# Patient Record
Sex: Female | Born: 1960 | Race: White | Hispanic: No | Marital: Single | Smoking: Never smoker
Health system: Southern US, Community
[De-identification: ages and names within clinical notes are randomized; demographics above are authoritative.]

## PROBLEM LIST (undated history)

## (undated) DIAGNOSIS — J189 Pneumonia, unspecified organism: Secondary | ICD-10-CM

## (undated) DIAGNOSIS — K5190 Ulcerative colitis, unspecified, without complications: Secondary | ICD-10-CM

## (undated) DIAGNOSIS — K219 Gastro-esophageal reflux disease without esophagitis: Secondary | ICD-10-CM

## (undated) DIAGNOSIS — J45909 Unspecified asthma, uncomplicated: Secondary | ICD-10-CM

## (undated) DIAGNOSIS — R112 Nausea with vomiting, unspecified: Secondary | ICD-10-CM

## (undated) DIAGNOSIS — Z8759 Personal history of other complications of pregnancy, childbirth and the puerperium: Secondary | ICD-10-CM

## (undated) DIAGNOSIS — U071 COVID-19: Secondary | ICD-10-CM

## (undated) DIAGNOSIS — M1990 Unspecified osteoarthritis, unspecified site: Secondary | ICD-10-CM

## (undated) DIAGNOSIS — Z8719 Personal history of other diseases of the digestive system: Secondary | ICD-10-CM

## (undated) DIAGNOSIS — J40 Bronchitis, not specified as acute or chronic: Secondary | ICD-10-CM

## (undated) DIAGNOSIS — T4145XA Adverse effect of unspecified anesthetic, initial encounter: Secondary | ICD-10-CM

## (undated) DIAGNOSIS — Z9889 Other specified postprocedural states: Secondary | ICD-10-CM

## (undated) DIAGNOSIS — M810 Age-related osteoporosis without current pathological fracture: Secondary | ICD-10-CM

## (undated) DIAGNOSIS — E039 Hypothyroidism, unspecified: Secondary | ICD-10-CM

## (undated) DIAGNOSIS — G43909 Migraine, unspecified, not intractable, without status migrainosus: Secondary | ICD-10-CM

## (undated) DIAGNOSIS — F419 Anxiety disorder, unspecified: Secondary | ICD-10-CM

## (undated) DIAGNOSIS — E876 Hypokalemia: Secondary | ICD-10-CM

## (undated) DIAGNOSIS — E785 Hyperlipidemia, unspecified: Secondary | ICD-10-CM

## (undated) DIAGNOSIS — F32A Depression, unspecified: Secondary | ICD-10-CM

## (undated) DIAGNOSIS — I639 Cerebral infarction, unspecified: Secondary | ICD-10-CM

## (undated) DIAGNOSIS — I10 Essential (primary) hypertension: Secondary | ICD-10-CM

## (undated) DIAGNOSIS — T8859XA Other complications of anesthesia, initial encounter: Secondary | ICD-10-CM

## (undated) DIAGNOSIS — F329 Major depressive disorder, single episode, unspecified: Secondary | ICD-10-CM

## (undated) DIAGNOSIS — R768 Other specified abnormal immunological findings in serum: Secondary | ICD-10-CM

## (undated) DIAGNOSIS — T7840XA Allergy, unspecified, initial encounter: Secondary | ICD-10-CM

## (undated) DIAGNOSIS — E079 Disorder of thyroid, unspecified: Secondary | ICD-10-CM

## (undated) HISTORY — PX: BREAST SURGERY: SHX581

## (undated) HISTORY — DX: Ulcerative colitis, unspecified, without complications: K51.90

## (undated) HISTORY — DX: Personal history of other complications of pregnancy, childbirth and the puerperium: Z87.59

## (undated) HISTORY — PX: COLONOSCOPY: SHX174

## (undated) HISTORY — DX: Gastro-esophageal reflux disease without esophagitis: K21.9

## (undated) HISTORY — DX: Migraine, unspecified, not intractable, without status migrainosus: G43.909

## (undated) HISTORY — DX: Bronchitis, not specified as acute or chronic: J40

## (undated) HISTORY — DX: Age-related osteoporosis without current pathological fracture: M81.0

## (undated) HISTORY — DX: COVID-19: U07.1

## (undated) HISTORY — DX: Depression, unspecified: F32.A

## (undated) HISTORY — DX: Hyperlipidemia, unspecified: E78.5

## (undated) HISTORY — DX: Unspecified asthma, uncomplicated: J45.909

## (undated) HISTORY — DX: Other specified abnormal immunological findings in serum: R76.8

## (undated) HISTORY — PX: DILATION AND CURETTAGE OF UTERUS: SHX78

## (undated) HISTORY — DX: Allergy, unspecified, initial encounter: T78.40XA

## (undated) HISTORY — PX: ANEURYSM COILING: SHX5349

## (undated) HISTORY — DX: Hypokalemia: E87.6

## (undated) HISTORY — DX: Disorder of thyroid, unspecified: E07.9

## (undated) HISTORY — DX: Major depressive disorder, single episode, unspecified: F32.9

## (undated) HISTORY — PX: TONSILLECTOMY AND ADENOIDECTOMY: SUR1326

## (undated) HISTORY — DX: Anxiety disorder, unspecified: F41.9

---

## 1992-01-31 HISTORY — PX: LAPAROSCOPIC ABDOMINAL EXPLORATION: SHX6249

## 2006-03-25 ENCOUNTER — Inpatient Hospital Stay (HOSPITAL_COMMUNITY): Admission: AD | Admit: 2006-03-25 | Discharge: 2006-03-25 | Payer: Self-pay | Admitting: Gynecology

## 2006-03-27 ENCOUNTER — Emergency Department (HOSPITAL_COMMUNITY): Admission: EM | Admit: 2006-03-27 | Discharge: 2006-03-28 | Payer: Self-pay | Admitting: Emergency Medicine

## 2006-10-06 ENCOUNTER — Emergency Department (HOSPITAL_COMMUNITY): Admission: EM | Admit: 2006-10-06 | Discharge: 2006-10-06 | Payer: Self-pay | Admitting: Emergency Medicine

## 2007-03-19 ENCOUNTER — Encounter: Admission: RE | Admit: 2007-03-19 | Discharge: 2007-03-19 | Payer: Self-pay | Admitting: *Deleted

## 2007-03-26 ENCOUNTER — Other Ambulatory Visit: Admission: RE | Admit: 2007-03-26 | Discharge: 2007-03-26 | Payer: Self-pay | Admitting: Obstetrics and Gynecology

## 2008-07-28 ENCOUNTER — Emergency Department (HOSPITAL_COMMUNITY): Admission: EM | Admit: 2008-07-28 | Discharge: 2008-07-28 | Payer: Self-pay | Admitting: Emergency Medicine

## 2008-09-24 ENCOUNTER — Other Ambulatory Visit: Admission: RE | Admit: 2008-09-24 | Discharge: 2008-09-24 | Payer: Self-pay | Admitting: Obstetrics and Gynecology

## 2008-11-30 ENCOUNTER — Encounter: Admission: RE | Admit: 2008-11-30 | Discharge: 2008-11-30 | Payer: Self-pay | Admitting: Family Medicine

## 2010-05-09 LAB — POCT I-STAT, CHEM 8
BUN: 13 mg/dL (ref 6–23)
Calcium, Ion: 1.12 mmol/L (ref 1.12–1.32)
Chloride: 106 mEq/L (ref 96–112)
Creatinine, Ser: 0.9 mg/dL (ref 0.4–1.2)
Glucose, Bld: 108 mg/dL — ABNORMAL HIGH (ref 70–99)
HCT: 43 % (ref 36.0–46.0)
Hemoglobin: 14.6 g/dL (ref 12.0–15.0)
Potassium: 4.1 mEq/L (ref 3.5–5.1)
Sodium: 137 mEq/L (ref 135–145)
TCO2: 23 mmol/L (ref 0–100)

## 2010-05-09 LAB — URINALYSIS, ROUTINE W REFLEX MICROSCOPIC
Bilirubin Urine: NEGATIVE
Glucose, UA: NEGATIVE mg/dL
Hgb urine dipstick: NEGATIVE
Ketones, ur: 15 mg/dL — AB
Nitrite: NEGATIVE
Protein, ur: NEGATIVE mg/dL
Specific Gravity, Urine: 1.02 (ref 1.005–1.030)
Urobilinogen, UA: 0.2 mg/dL (ref 0.0–1.0)
pH: 6.5 (ref 5.0–8.0)

## 2010-05-09 LAB — ETHANOL: Alcohol, Ethyl (B): <5 mg/dL (ref 0–10)

## 2010-05-09 LAB — RAPID URINE DRUG SCREEN, HOSP PERFORMED
Amphetamines: NOT DETECTED
Barbiturates: NOT DETECTED
Benzodiazepines: POSITIVE — AB
Cocaine: NOT DETECTED
Opiates: POSITIVE — AB
Tetrahydrocannabinol: NOT DETECTED

## 2010-07-21 ENCOUNTER — Emergency Department (HOSPITAL_BASED_OUTPATIENT_CLINIC_OR_DEPARTMENT_OTHER)
Admission: EM | Admit: 2010-07-21 | Discharge: 2010-07-21 | Disposition: A | Discharge status: Home / Self Care | Payer: Self-pay | Attending: Emergency Medicine | Admitting: Emergency Medicine

## 2010-07-21 DIAGNOSIS — F329 Major depressive disorder, single episode, unspecified: Secondary | ICD-10-CM | POA: Insufficient documentation

## 2010-07-21 DIAGNOSIS — F3289 Other specified depressive episodes: Secondary | ICD-10-CM | POA: Insufficient documentation

## 2010-07-21 DIAGNOSIS — W540XXA Bitten by dog, initial encounter: Secondary | ICD-10-CM | POA: Insufficient documentation

## 2010-07-21 DIAGNOSIS — S0120XA Unspecified open wound of nose, initial encounter: Secondary | ICD-10-CM | POA: Insufficient documentation

## 2010-07-21 DIAGNOSIS — K589 Irritable bowel syndrome without diarrhea: Secondary | ICD-10-CM | POA: Insufficient documentation

## 2010-07-21 DIAGNOSIS — S51809A Unspecified open wound of unspecified forearm, initial encounter: Secondary | ICD-10-CM | POA: Insufficient documentation

## 2010-07-25 ENCOUNTER — Emergency Department (HOSPITAL_BASED_OUTPATIENT_CLINIC_OR_DEPARTMENT_OTHER)
Admission: EM | Admit: 2010-07-25 | Discharge: 2010-07-25 | Disposition: A | Discharge status: Home / Self Care | Payer: PRIVATE HEALTH INSURANCE | Attending: Emergency Medicine | Admitting: Emergency Medicine

## 2010-07-25 DIAGNOSIS — F3289 Other specified depressive episodes: Secondary | ICD-10-CM | POA: Insufficient documentation

## 2010-07-25 DIAGNOSIS — F329 Major depressive disorder, single episode, unspecified: Secondary | ICD-10-CM | POA: Insufficient documentation

## 2010-07-25 DIAGNOSIS — Z4802 Encounter for removal of sutures: Secondary | ICD-10-CM | POA: Insufficient documentation

## 2010-07-25 DIAGNOSIS — K589 Irritable bowel syndrome without diarrhea: Secondary | ICD-10-CM | POA: Insufficient documentation

## 2010-07-25 DIAGNOSIS — L03221 Cellulitis of neck: Secondary | ICD-10-CM | POA: Insufficient documentation

## 2010-07-25 DIAGNOSIS — L0211 Cutaneous abscess of neck: Secondary | ICD-10-CM | POA: Insufficient documentation

## 2010-08-02 ENCOUNTER — Emergency Department (HOSPITAL_BASED_OUTPATIENT_CLINIC_OR_DEPARTMENT_OTHER)
Admission: EM | Admit: 2010-08-02 | Discharge: 2010-08-02 | Disposition: A | Discharge status: Home / Self Care | Payer: PRIVATE HEALTH INSURANCE | Attending: Emergency Medicine | Admitting: Emergency Medicine

## 2010-08-02 DIAGNOSIS — L0211 Cutaneous abscess of neck: Secondary | ICD-10-CM | POA: Insufficient documentation

## 2010-08-02 DIAGNOSIS — K589 Irritable bowel syndrome without diarrhea: Secondary | ICD-10-CM | POA: Insufficient documentation

## 2010-08-02 DIAGNOSIS — L089 Local infection of the skin and subcutaneous tissue, unspecified: Secondary | ICD-10-CM | POA: Insufficient documentation

## 2010-08-02 DIAGNOSIS — L03221 Cellulitis of neck: Secondary | ICD-10-CM | POA: Insufficient documentation

## 2010-08-22 ENCOUNTER — Emergency Department (INDEPENDENT_AMBULATORY_CARE_PROVIDER_SITE_OTHER): Payer: PRIVATE HEALTH INSURANCE

## 2010-08-22 ENCOUNTER — Emergency Department (HOSPITAL_BASED_OUTPATIENT_CLINIC_OR_DEPARTMENT_OTHER)
Admission: EM | Admit: 2010-08-22 | Discharge: 2010-08-22 | Disposition: A | Discharge status: Home / Self Care | Payer: PRIVATE HEALTH INSURANCE | Attending: Emergency Medicine | Admitting: Emergency Medicine

## 2010-08-22 ENCOUNTER — Encounter: Payer: Self-pay | Admitting: *Deleted

## 2010-08-22 ENCOUNTER — Other Ambulatory Visit: Payer: Self-pay

## 2010-08-22 DIAGNOSIS — R51 Headache: Secondary | ICD-10-CM

## 2010-08-22 DIAGNOSIS — H538 Other visual disturbances: Secondary | ICD-10-CM

## 2010-08-22 DIAGNOSIS — I10 Essential (primary) hypertension: Secondary | ICD-10-CM | POA: Insufficient documentation

## 2010-08-22 DIAGNOSIS — R079 Chest pain, unspecified: Secondary | ICD-10-CM | POA: Insufficient documentation

## 2010-08-22 DIAGNOSIS — R209 Unspecified disturbances of skin sensation: Secondary | ICD-10-CM

## 2010-08-22 DIAGNOSIS — R42 Dizziness and giddiness: Secondary | ICD-10-CM

## 2010-08-22 HISTORY — DX: Essential (primary) hypertension: I10

## 2010-08-22 LAB — COMPREHENSIVE METABOLIC PANEL
ALT: 13 U/L (ref 0–35)
AST: 18 U/L (ref 0–37)
Albumin: 3.5 g/dL (ref 3.5–5.2)
Alkaline Phosphatase: 72 U/L (ref 39–117)
BUN: 18 mg/dL (ref 6–23)
CO2: 26 mEq/L (ref 19–32)
Calcium: 9 mg/dL (ref 8.4–10.5)
Chloride: 107 mEq/L (ref 96–112)
Creatinine, Ser: 0.6 mg/dL (ref 0.50–1.10)
GFR calc Af Amer: >60 mL/min (ref >60)
GFR calc non Af Amer: >60 mL/min (ref >60)
Glucose, Bld: 112 mg/dL — ABNORMAL HIGH (ref 70–99)
Potassium: 4 mEq/L (ref 3.5–5.1)
Sodium: 142 mEq/L (ref 135–145)
Total Bilirubin: 0.2 mg/dL — ABNORMAL LOW (ref 0.3–1.2)
Total Protein: 6.7 g/dL (ref 6.0–8.3)

## 2010-08-22 LAB — DIFFERENTIAL
Basophils Absolute: 0 10*3/uL (ref 0.0–0.1)
Basophils Relative: 1 % (ref 0–1)
Eosinophils Absolute: 0.2 10*3/uL (ref 0.0–0.7)
Eosinophils Relative: 4 % (ref 0–5)
Lymphocytes Relative: 31 % (ref 12–46)
Lymphs Abs: 1.7 10*3/uL (ref 0.7–4.0)
Monocytes Absolute: 0.5 10*3/uL (ref 0.1–1.0)
Monocytes Relative: 8 % (ref 3–12)
Neutro Abs: 3.1 10*3/uL (ref 1.7–7.7)
Neutrophils Relative %: 56 % (ref 43–77)

## 2010-08-22 LAB — CBC
HCT: 38.2 % (ref 36.0–46.0)
Hemoglobin: 13.1 g/dL (ref 12.0–15.0)
MCH: 30.3 pg (ref 26.0–34.0)
MCHC: 34.3 g/dL (ref 30.0–36.0)
MCV: 88.4 fL (ref 78.0–100.0)
Platelets: 249 10*3/uL (ref 150–400)
RBC: 4.32 MIL/uL (ref 3.87–5.11)
RDW: 11.9 % (ref 11.5–15.5)
WBC: 5.6 10*3/uL (ref 4.0–10.5)

## 2010-08-22 LAB — CK TOTAL AND CKMB (NOT AT ARMC)
CK, MB: 1.7 ng/mL (ref 0.3–4.0)
Relative Index: INVALID (ref 0.0–2.5)
Total CK: 70 U/L (ref 7–177)

## 2010-08-22 LAB — TROPONIN I: Troponin I: <0.3 ng/mL (ref <0.30)

## 2010-08-22 NOTE — ED Notes (Signed)
Pt reports woke up at 4 am with c/o dizziness. States her left arm has pain and fingers feel numb. Also c/o blurred vision. Pt drove self to ER. Denies nausea/vomitting. Denies pain elsewhere. States breathing "different than usual." Vital signs stable. Pt in NAD.

## 2010-08-22 NOTE — ED Notes (Signed)
While rounding, pt mentioned she wants her ears checked for wax build up that may be causing her dizziness. Also reports bloody stools for "the last couple of months". MD Delo made aware, no new orders received.

## 2010-08-22 NOTE — ED Provider Notes (Signed)
History     Chief Complaint  Patient presents with  . Dizziness   HPI Comments: Patient states was shag-dancing last night, didn't feel well, so left early.  Woke at 4 AM with "splitting headache".  Broad[facility] John med and went back to bed.  Woke this morning feeling tight in chest, continued headache, nauseated, and left arm feels numb.    The history is provided by the patient.    Past Medical History  Diagnosis Date  . Hypertension     History reviewed. No pertinent past surgical history.  No family history on file.  History  Substance Use Topics  . Smoking status: Not on file  . Smokeless tobacco: Not on file  . Alcohol Use: 0.6 oz/week    1 Cans of beer per week    OB History    Grav Para Term Preterm Abortions TAB SAB Ect Mult Living                  Review of Systems  Constitutional: Positive for activity change. Negative for chills, appetite change and fatigue.  HENT: Negative for ear pain, neck pain and neck stiffness.   Eyes: Positive for visual disturbance.  Respiratory: Positive for chest tightness.   Cardiovascular: Positive for chest pain.  Gastrointestinal: Positive for nausea. Negative for vomiting and abdominal distention.  Neurological: Positive for headaches.  All other systems reviewed and are negative.    Physical Exam  BP 153/65  Pulse 84  Temp(Src) 97.8 F (36.6 C) (Oral)  Resp 16  SpO2 100%  Physical Exam  Constitutional: She is oriented to person, place, and time. She appears well-developed and well-nourished. No distress.  HENT:  Head: Normocephalic and atraumatic.  Eyes: EOM are normal. Pupils are equal, round, and reactive to light.  Neck: Normal range of motion. Neck supple.  Cardiovascular: Normal rate, regular rhythm and normal heart sounds.  Exam reveals no gallop and no friction rub.   No murmur heard. Pulmonary/Chest: Effort normal and breath sounds normal. No respiratory distress.  Abdominal: Soft. She exhibits no  distension. There is no tenderness.  Musculoskeletal: Normal range of motion. She exhibits no edema.  Neurological: She is alert and oriented to person, place, and time. No cranial nerve deficit.  Skin: Skin is warm and dry. She is not diaphoretic.  Psychiatric: She has a normal mood and affect.    ED Course  Procedures  MDM Labs, EKG, CT all okay.  Clinical picture somewhat confusing but doesn't appear to be any emergent condition.      Riley Lam [facility] 08/22/10 2841

## 2010-11-11 LAB — POCT CARDIAC MARKERS
CKMB, poc: <1 — ABNORMAL LOW
Myoglobin, poc: 44.1
Operator id: 4534
Troponin i, poc: <0.05

## 2010-11-11 LAB — BASIC METABOLIC PANEL
BUN: 12
CO2: 27
Calcium: 9.2
Chloride: 106
Creatinine, Ser: 0.75
GFR calc Af Amer: >60
GFR calc non Af Amer: >60
Glucose, Bld: 85
Potassium: 3.8
Sodium: 141

## 2010-11-11 LAB — DIFFERENTIAL
Basophils Absolute: 0.1
Basophils Relative: 1
Eosinophils Absolute: 0.3
Eosinophils Relative: 4
Lymphocytes Relative: 33
Lymphs Abs: 2.6
Monocytes Absolute: 0.6
Monocytes Relative: 8
Neutro Abs: 4.4
Neutrophils Relative %: 55

## 2010-11-11 LAB — CBC
HCT: 40
Hemoglobin: 14
MCHC: 35
MCV: 88.9
Platelets: 226
RBC: 4.49
RDW: 12.8
WBC: 8

## 2012-05-20 ENCOUNTER — Ambulatory Visit (INDEPENDENT_AMBULATORY_CARE_PROVIDER_SITE_OTHER): Payer: BC Managed Care – PPO | Admitting: Licensed Clinical Social Worker

## 2012-05-20 DIAGNOSIS — IMO0002 Reserved for concepts with insufficient information to code with codable children: Secondary | ICD-10-CM

## 2012-06-10 ENCOUNTER — Ambulatory Visit (INDEPENDENT_AMBULATORY_CARE_PROVIDER_SITE_OTHER): Payer: BC Managed Care – PPO | Admitting: Licensed Clinical Social Worker

## 2012-06-10 DIAGNOSIS — IMO0002 Reserved for concepts with insufficient information to code with codable children: Secondary | ICD-10-CM

## 2012-06-26 ENCOUNTER — Ambulatory Visit (INDEPENDENT_AMBULATORY_CARE_PROVIDER_SITE_OTHER): Payer: BC Managed Care – PPO | Admitting: Licensed Clinical Social Worker

## 2012-06-26 DIAGNOSIS — IMO0002 Reserved for concepts with insufficient information to code with codable children: Secondary | ICD-10-CM

## 2012-07-15 ENCOUNTER — Ambulatory Visit: Payer: BC Managed Care – PPO | Admitting: Licensed Clinical Social Worker

## 2012-10-22 ENCOUNTER — Encounter: Payer: Self-pay | Admitting: Certified Nurse Midwife

## 2012-10-23 ENCOUNTER — Ambulatory Visit (INDEPENDENT_AMBULATORY_CARE_PROVIDER_SITE_OTHER): Payer: BC Managed Care – PPO | Admitting: Certified Nurse Midwife

## 2012-10-23 ENCOUNTER — Encounter: Payer: Self-pay | Admitting: Certified Nurse Midwife

## 2012-10-23 VITALS — BP 120/82 | HR 80 | Resp 16 | Ht 65.5 in | Wt 184.0 lb

## 2012-10-23 DIAGNOSIS — Z01419 Encounter for gynecological examination (general) (routine) without abnormal findings: Secondary | ICD-10-CM

## 2012-10-23 DIAGNOSIS — Z0000 Encounter for general adult medical examination without abnormal findings: Secondary | ICD-10-CM

## 2012-10-23 NOTE — Patient Instructions (Signed)

## 2012-10-23 NOTE — Progress Notes (Signed)
52 y.o. [id number] Single Caucasian Fe here for annual exam. Menopausal no vaginal bleeding or vaginal dryness. Currently in treatment for anxiety and depression. Hypertension better, no medication now. Not sexually active. 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   Patient's last menstrual period was 01/31/2007.          Sexually active: no  The current method of family planning is post menopausal status.    Exercising: no  no exercise Smoker:  no  Health Maintenance: Pap:  11-04-10 neg MMG:  10-17-12 normal Colonoscopy: 2007 BMD:   2007 TDaP:  6/12 Labs: none Self breast exam: done occ   reports that she has never smoked. She does not have any smokeless tobacco history on file. She reports that she drinks about 1.2 ounces of alcohol per week. She reports that she does not use illicit drugs.  Past Medical History  Diagnosis Date  . Hypertension   . Migraines   . Depression   . Anxiety     Past Surgical History  Procedure Laterality Date  . Tonsillectomy and adenoidectomy    . Laparoscopic abdominal exploration  1994    endometriosis  . Dilation and curettage of uterus      Current Outpatient Prescriptions  Medication Sig Dispense Refill  . Asenapine Maleate (SAPHRIS SL) Place under the tongue. 2 at bedtime      . buPROPion (WELLBUTRIN XL) 150 MG 24 hr tablet 3 (three) times daily.      . DULoxetine (CYMBALTA) 60 MG capsule Take 60 mg by mouth daily.        . Mirtazapine (REMERON PO) Take by mouth daily.       . Sertraline HCl (ZOLOFT PO) Take by mouth 2 (two) times daily.        No current facility-administered medications for this visit.    Family History  Problem Relation Age of Onset  . Breast cancer Mother   . Diabetes Mother   . Cancer Father     colon  . Diabetes Father     ROS:  Pertinent items are noted in HPI.  Otherwise, a comprehensive ROS was negative.  Exam:   BP 120/82  Pulse 80  Resp 16  Ht 5' 5.5" (1.664 m)  Wt 184 lb (83.462 kg)  BMI  30.14 kg/m2  LMP 01/31/2007 Height: 5' 5.5" (166.4 cm)  Ht Readings from Last 3 Encounters:  10/23/12 5' 5.5" (1.664 m)    General appearance: alert, cooperative and appears stated age Head: Normocephalic, without obvious abnormality, atraumatic Neck: no adenopathy, supple, symmetrical, trachea midline and thyroid normal to inspection and palpation Lungs: clear to auscultation bilaterally Breasts: normal appearance, no masses or tenderness, No nipple retraction or dimpling, No nipple discharge or bleeding, No axillary or supraclavicular adenopathy Heart: regular rate and rhythm Abdomen: soft, non-tender; no masses,  no organomegaly Extremities: extremities normal, atraumatic, no cyanosis or edema Skin: Skin color, texture, turgor normal. No rashes or lesions Lymph nodes: Cervical, supraclavicular, and axillary nodes normal. No abnormal inguinal nodes palpated Neurologic: Grossly normal   Pelvic: External genitalia:  no lesions              Urethra:  normal appearing urethra with no masses, tenderness or lesions              Bartholin's and Skene's: normal                 Vagina: normal appearing vagina with normal color and discharge, no lesions  Cervix: normal, non tender              Pap taken: yes Bimanual Exam:  Uterus:  normal size, contour, position, consistency, mobility, non-tender and anteverted              Adnexa: normal adnexa and no mass, fullness, tenderness               Rectovaginal: Confirms               Anus:  normal sphincter tone, no lesions  A:  Well Woman with normal exam  Menopausal no HRT  Hypertension stable without medication now  Anxiety under treatment with MD  Family history of breast cancer(mother 9)  P:   Reviewed health and wellness pertinent to exam  Aware of need to evaluate if vaginal bleeding  Continue follow up as indicated  Stressed SBE, mammogram and clinical exam  Pap smear as per guidelines   Mammogram yearly pap smear  taken today with HPVHR  counseled on breast self exam, mammography screening, STD prevention, menopause, adequate intake of calcium and vitamin D, diet and exercise, discussed risks and benefits of colonoscopy declines scheduling at this point.  Lab IFOB  return annually or prn  An After Visit Summary was printed and given to the patient.

## 2012-10-24 NOTE — Progress Notes (Signed)
Note reviewed, agree with plan.  Ayani Ospina, MD  

## 2012-10-28 LAB — IPS PAP TEST WITH HPV

## 2012-10-29 ENCOUNTER — Ambulatory Visit (INDEPENDENT_AMBULATORY_CARE_PROVIDER_SITE_OTHER): Payer: BC Managed Care – PPO | Admitting: Family Medicine

## 2012-10-29 VITALS — BP 130/90 | HR 84 | Temp 97.9°F | Resp 18 | Ht 66.5 in | Wt 184.0 lb

## 2012-10-29 DIAGNOSIS — M79601 Pain in right arm: Secondary | ICD-10-CM

## 2012-10-29 DIAGNOSIS — M79609 Pain in unspecified limb: Secondary | ICD-10-CM

## 2012-10-29 DIAGNOSIS — R030 Elevated blood-pressure reading, without diagnosis of hypertension: Secondary | ICD-10-CM

## 2012-10-29 DIAGNOSIS — IMO0001 Reserved for inherently not codable concepts without codable children: Secondary | ICD-10-CM

## 2012-10-29 LAB — POCT CBC
Granulocyte percent: 54 %G (ref 37–80)
HCT, POC: 47.1 % (ref 37.7–47.9)
Hemoglobin: 15.2 g/dL (ref 12.2–16.2)
Lymph, poc: 1.9 (ref 0.6–3.4)
MCH, POC: 30.2 pg (ref 27–31.2)
MCHC: 32.3 g/dL (ref 31.8–35.4)
MCV: 93.4 fL (ref 80–97)
MID (cbc): 0.5 (ref 0–0.9)
MPV: 10.3 fL (ref 0–99.8)
POC Granulocyte: 2.8 (ref 2–6.9)
POC LYMPH PERCENT: 36.4 %L (ref 10–50)
POC MID %: 9.6 %M (ref 0–12)
Platelet Count, POC: 255 10*3/uL (ref 142–424)
RBC: 5.04 M/uL (ref 4.04–5.48)
RDW, POC: 14.3 %
WBC: 5.2 10*3/uL (ref 4.6–10.2)

## 2012-10-29 LAB — BASIC METABOLIC PANEL
BUN: 11 mg/dL (ref 6–23)
CO2: 28 mEq/L (ref 19–32)
Calcium: 9.4 mg/dL (ref 8.4–10.5)
Chloride: 103 mEq/L (ref 96–112)
Creat: 0.9 mg/dL (ref 0.50–1.10)
Glucose, Bld: 82 mg/dL (ref 70–99)
Potassium: 3.7 mEq/L (ref 3.5–5.3)
Sodium: 141 mEq/L (ref 135–145)

## 2012-10-29 MED ORDER — METAXALONE 800 MG PO TABS: 800.0000 mg | ORAL_TABLET | Freq: Three times a day (TID) | ORAL | Status: DC

## 2012-10-29 NOTE — Patient Instructions (Addendum)
If you have any concerns please give me a call- if you have any chest pain, weakness in the arm, or any other concerns please get help right away.    Use the muscle relaxer as needed -remember it can make you feel sleepy.

## 2012-10-29 NOTE — Progress Notes (Addendum)
[facility] [address] [place] [address] [phone]  Date:  10/29/2012   Name:  Tiffany Horn   DOB:  Aug 04, 1960   MRN:  [id number]  PCP:  No PCP Per Patient    Chief Complaint: pain in arm right arm feels like fluid in arm and Hypertension   History of Present Illness:  Tiffany Horn is a 52 y.o. very pleasant female patient who presents with the following:  Here today with pain in her right arm that she noted last night.  She awoke a couple of times with the arm throbbing.   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  She "just did not feel right" while standing up during lunch.  She went to the nurse office and her blood pressure was noted to be elevated- BP 161/114  Her right arm feels "numb" to the touch, and feels "like it has fluid in it."  Her arm is more comfortable hanging straight down- flexing at the elbow is uncomfortable.  The arm does not seem weak.   No known trauma to her arm.   She is no longer on any BP medication- she was tx for HTN several years ago.  Tiffany Horn at [facility] is her PCP  Never a smoker.   Her MGF had a history of heart problems- this occurred in his 16s.  Otherwise she is not aware of any family history of CAD.  She does not have any personal history of heart problems.    She is not aware of any increased activity or exercise as of late She ate lunch about 3 hours ago.     There are no active problems to display for this patient.   Past Medical History  Diagnosis Date  . Hypertension   . Migraines   . Depression   . Anxiety     Past Surgical History  Procedure Laterality Date  . Tonsillectomy and adenoidectomy    . Laparoscopic abdominal exploration  1994    endometriosis  . Dilation and curettage of uterus      History  Substance Use Topics  . Smoking status: Never Smoker   . Smokeless tobacco: Not on file  . Alcohol Use: 1.2 oz/week    2 Cans of beer per week     Family History  Problem Relation Age of Onset  . Breast cancer Mother   . Diabetes Mother   . Cancer Father     colon  . Diabetes Father     No Known Allergies  Medication list has been reviewed and updated.  Current Outpatient Prescriptions on File Prior to Visit  Medication Sig Dispense Refill  . Asenapine Maleate (SAPHRIS SL) Place under the tongue. 2 at bedtime      . buPROPion (WELLBUTRIN XL) 150 MG 24 hr tablet 3 (three) times daily.      . Mirtazapine (REMERON PO) Take by mouth daily.       . Sertraline HCl (ZOLOFT PO) Take by mouth 2 (two) times daily.       . DULoxetine (CYMBALTA) 60 MG capsule Take 60 mg by mouth daily.         No current facility-administered medications on file prior to visit.    Review of Systems:  As per HPI- otherwise negative.   Physical Examination: Filed Vitals:   10/29/12 1306  BP: 138/82  Pulse: 84  Temp: 97.9 F (36.6 C)  Resp: 18  Filed Vitals:   10/29/12 1306  Height: 5' 6.5" (1.689 m)  Weight: 184 lb (83.462 kg)   Body mass index is 29.26 kg/(m^2). Ideal Body Weight: Weight in (lb) to have BMI = 25: 156.9  GEN: WDWN, NAD, Non-toxic, A & O x 3, overweight, looks well HEENT: Atraumatic, Normocephalic. Neck supple. No masses, No LAD.  Bilateral TM wnl, oropharynx normal.  PEERL,EOMI.   Ears and Nose: No external deformity. CV: RRR, No M/G/R. No JVD. No thrill. No extra heart sounds. PULM: CTA B, no wheezes, crackles, rhonchi. No retractions. No resp. distress. No accessory muscle use. ABD: S, NT, ND, +BS. No rebound. No HSM. EXTR: No c/c/e.  I am not able to appreciate any swelling or change in "texture" of her right forearm compared to her left.   NEURO Normal gait. Normal strength and DTR all extremities.  She feels sore in her right forearm, but does not note numbness on exam.  Normal balance PSYCH: Normally interactive. Conversant. Not depressed or anxious appearing.  Calm demeanor.  She is tender in the right  trapezius muscle, and over the right shoulder blade.  She is also tender in the right forearm, more on the palmar side.   Normal ROM of right shoulder, no pain.  No rash or lesion on the arm or shoulder to suggest shingles.  No axillary LAD  EKG: NSR, no ST elevation or depression  Results for orders placed in visit on 10/29/12  POCT CBC      Result Value Range   WBC 5.2  4.6 - 10.2 K/uL   Lymph, poc 1.9  0.6 - 3.4   POC LYMPH PERCENT 36.4  10 - 50 %L   MID (cbc) 0.5  0 - 0.9   POC MID % 9.6  0 - 12 %M   POC Granulocyte 2.8  2 - 6.9   Granulocyte percent 54.0  37 - 80 %G   RBC 5.04  4.04 - 5.48 M/uL   Hemoglobin 15.2  12.2 - 16.2 g/dL   HCT, POC 65.7  84.6 - 47.9 %   MCV 93.4  80 - 97 fL   MCH, POC 30.2  27 - 31.2 pg   MCHC 32.3  31.8 - 35.4 g/dL   RDW, POC 96.2     Platelet Count, POC 255  142 - 424 K/uL   MPV 10.3  0 - 99.8 fL    Assessment and Plan: Right arm pain - Plan: metaxalone (SKELAXIN) 800 MG tablet  Elevated BP - Plan: POCT CBC, Basic metabolic panel, EKG 12-Lead  Tiffany Horn is here today with pain in her right arm and right trapezius, and elevated BP while at work today.  At this time her BP is ok.  She appears well, in no distress and her exam and objective physical findings/ labs/ EKG today are reassuring.    Explained that her sx do not definitively fit with any one diagnosis, and is it possible that her sx represent something more serious including cardiac pain, an UE clot or a stroke.  Offered to arrange further evaluation at the ER.  However at this time she declines further evaluation and wishes to try a muscle relaxer.  Her arm pain may come from the spasm in her right trapezius.    She will seek further care if not better in the next day or two- Sooner if worse.   Signed Abbe Amsterdam, MD  10/2: called to check on her.  Her right arm continues  to hurt, mainly when she holds it in a certain position,.  She suspect a pinched nerve- I agree.  Asked her to come in  for a recheck today.  She has just taken a muscle relaxer and does not want to drive, but does plan to come in tomorrow for a recheck.  Let her know that her BMP is normal

## 2012-11-01 ENCOUNTER — Ambulatory Visit (INDEPENDENT_AMBULATORY_CARE_PROVIDER_SITE_OTHER): Payer: BC Managed Care – PPO | Admitting: Emergency Medicine

## 2012-11-01 ENCOUNTER — Ambulatory Visit: Payer: Self-pay

## 2012-11-01 VITALS — BP 120/80 | HR 92 | Temp 98.0°F | Resp 18 | Ht 66.0 in | Wt 179.0 lb

## 2012-11-01 DIAGNOSIS — M542 Cervicalgia: Secondary | ICD-10-CM

## 2012-11-01 DIAGNOSIS — M5412 Radiculopathy, cervical region: Secondary | ICD-10-CM

## 2012-11-01 MED ORDER — MELOXICAM 7.5 MG PO TABS: ORAL_TABLET | ORAL | Status: DC

## 2012-11-01 MED ORDER — GABAPENTIN 300 MG PO CAPS: ORAL_CAPSULE | ORAL | Status: DC

## 2012-11-01 NOTE — Patient Instructions (Addendum)
Cervical Radiculopathy  Cervical radiculopathy happens when a nerve in the neck is pinched or bruised by a slipped (herniated) disk or by arthritic changes in the bones of the cervical spine. This can occur due to an injury or as part of the normal aging process. Pressure on the cervical nerves can cause pain or numbness that runs from your neck all the way down into your arm and fingers.  CAUSES   There are many possible causes, including:   Injury.   Muscle tightness in the neck from overuse.   Swollen, painful joints (arthritis).   Breakdown or degeneration in the bones and joints of the spine (spondylosis) due to aging.   Bone spurs that may develop near the cervical nerves.  SYMPTOMS   Symptoms include pain, weakness, or numbness in the affected arm and hand. Pain can be severe or irritating. Symptoms may be worse when extending or turning the neck.  DIAGNOSIS   Your caregiver will ask about your symptoms and do a physical exam. He or she may test your strength and reflexes. X-rays, CT scans, and MRI scans may be needed in cases of injury or if the symptoms do not go away after a period of time. Electromyography (EMG) or nerve conduction testing may be done to study how your nerves and muscles are working.  TREATMENT   Your caregiver may recommend certain exercises to help relieve your symptoms. Cervical radiculopathy can, and often does, get better with time and treatment. If your problems continue, treatment options may include:   Wearing a soft collar for short periods of time.   Physical therapy to strengthen the neck muscles.   Medicines, such as nonsteroidal anti-inflammatory drugs (NSAIDs), oral corticosteroids, or spinal injections.   Surgery. Different types of surgery may be done depending on the cause of your problems.  HOME CARE INSTRUCTIONS    Put ice on the affected area.   Put ice in a plastic bag.   Place a towel between your skin and the bag.    Leave the ice on for 15-20 minutes, 3-4 times a day or as directed by your caregiver.   If ice does not help, you can try using heat. Take a warm shower or bath, or use a hot water bottle as directed by your caregiver.   You may try a gentle neck and shoulder massage.   Use a flat pillow when you sleep.   Only take over-the-counter or prescription medicines for pain, discomfort, or fever as directed by your caregiver.   If physical therapy was prescribed, follow your caregiver's directions.   If a soft collar was prescribed, use it as directed.  SEEK IMMEDIATE MEDICAL CARE IF:    Your pain gets much worse and cannot be controlled with medicines.   You have weakness or numbness in your hand, arm, face, or leg.   You have a high fever or a stiff, rigid neck.   You lose bowel or bladder control (incontinence).   You have trouble with walking, balance, or speaking.  MAKE SURE YOU:    Understand these instructions.   Will watch your condition.   Will get help right away if you are not doing well or get worse.  Document Released: 10/11/2000 Document Revised: 04/10/2011 Document Reviewed: 08/30/2010  [employer] Patient Information 2014 [employer].

## 2012-11-01 NOTE — Progress Notes (Signed)
  Subjective:    Patient ID: Tiffany Horn, female    DOB: 27-Jul-1960, 52 y.o.   MRN: [id number]  HPI Pt here for a recheck right forearm pain. Saw Dr. Patsy Lager on Tues who dx'ed pt with a pinched nerve and tried her on a muscle relaxant. Not feeling any better. Right 3rd and 4th digit paresthesia. Not able to sleep. Pain with lifting and lowering arm. Having neck pain.   Review of Systems     Objective:   Physical Exam there is pain with extension of the neck. Deep tendon reflexes are 2+ and symmetrical motor strength 5 out of 5.  [facility] reading (PRIMARY) by  Dr. Cleta Alberts there is C4-5 C5-6 C6-7 degenerative disc disease.        Assessment & Plan:  Patient presents with degenerative disc disease of the C-spine. We'll treat with Mobic and Neurontin. MRI scheduled of the C-spine.

## 2012-11-25 ENCOUNTER — Ambulatory Visit (INDEPENDENT_AMBULATORY_CARE_PROVIDER_SITE_OTHER): Payer: BC Managed Care – PPO | Admitting: Licensed Clinical Social Worker

## 2012-11-25 DIAGNOSIS — IMO0002 Reserved for concepts with insufficient information to code with codable children: Secondary | ICD-10-CM

## 2012-11-27 ENCOUNTER — Ambulatory Visit
Admission: RE | Admit: 2012-11-27 | Discharge: 2012-11-27 | Disposition: A | Discharge status: Home / Self Care | Payer: BC Managed Care – PPO | Source: Ambulatory Visit | Attending: Emergency Medicine | Admitting: Emergency Medicine

## 2012-11-27 DIAGNOSIS — M542 Cervicalgia: Secondary | ICD-10-CM

## 2012-12-10 ENCOUNTER — Telehealth: Payer: Self-pay | Admitting: Radiology

## 2012-12-10 DIAGNOSIS — M5412 Radiculopathy, cervical region: Secondary | ICD-10-CM

## 2012-12-10 NOTE — Telephone Encounter (Signed)
Copy mailed to her. Referral made. Apologized for the delay, she did not get call in a timely manner, she indicates okay, she is feeling better.

## 2012-12-10 NOTE — Telephone Encounter (Signed)
Left message for patient to call back about the scan. She should be evaluated by one of the neurosurgeons, have left message for her to call me back.

## 2012-12-16 ENCOUNTER — Ambulatory Visit: Payer: BC Managed Care – PPO | Admitting: Licensed Clinical Social Worker

## 2012-12-27 ENCOUNTER — Emergency Department (HOSPITAL_BASED_OUTPATIENT_CLINIC_OR_DEPARTMENT_OTHER)
Admission: EM | Admit: 2012-12-27 | Discharge: 2012-12-27 | Disposition: A | Discharge status: Home / Self Care | Payer: BC Managed Care – PPO | Attending: Emergency Medicine | Admitting: Emergency Medicine

## 2012-12-27 ENCOUNTER — Encounter (HOSPITAL_BASED_OUTPATIENT_CLINIC_OR_DEPARTMENT_OTHER): Payer: Self-pay | Admitting: Emergency Medicine

## 2012-12-27 ENCOUNTER — Emergency Department (HOSPITAL_BASED_OUTPATIENT_CLINIC_OR_DEPARTMENT_OTHER): Payer: BC Managed Care – PPO

## 2012-12-27 DIAGNOSIS — F411 Generalized anxiety disorder: Secondary | ICD-10-CM | POA: Insufficient documentation

## 2012-12-27 DIAGNOSIS — J159 Unspecified bacterial pneumonia: Secondary | ICD-10-CM | POA: Insufficient documentation

## 2012-12-27 DIAGNOSIS — G43909 Migraine, unspecified, not intractable, without status migrainosus: Secondary | ICD-10-CM | POA: Insufficient documentation

## 2012-12-27 DIAGNOSIS — R000 Tachycardia, unspecified: Secondary | ICD-10-CM | POA: Insufficient documentation

## 2012-12-27 DIAGNOSIS — I10 Essential (primary) hypertension: Secondary | ICD-10-CM | POA: Insufficient documentation

## 2012-12-27 DIAGNOSIS — F3289 Other specified depressive episodes: Secondary | ICD-10-CM | POA: Insufficient documentation

## 2012-12-27 DIAGNOSIS — R197 Diarrhea, unspecified: Secondary | ICD-10-CM | POA: Insufficient documentation

## 2012-12-27 DIAGNOSIS — R109 Unspecified abdominal pain: Secondary | ICD-10-CM | POA: Insufficient documentation

## 2012-12-27 DIAGNOSIS — Z79899 Other long term (current) drug therapy: Secondary | ICD-10-CM | POA: Insufficient documentation

## 2012-12-27 DIAGNOSIS — R112 Nausea with vomiting, unspecified: Secondary | ICD-10-CM | POA: Insufficient documentation

## 2012-12-27 DIAGNOSIS — F329 Major depressive disorder, single episode, unspecified: Secondary | ICD-10-CM | POA: Insufficient documentation

## 2012-12-27 DIAGNOSIS — J189 Pneumonia, unspecified organism: Secondary | ICD-10-CM

## 2012-12-27 LAB — CBC WITH DIFFERENTIAL/PLATELET
Band Neutrophils: 26 % — ABNORMAL HIGH (ref 0–10)
Basophils Absolute: 0 10*3/uL (ref 0.0–0.1)
Basophils Relative: 0 % (ref 0–1)
Blasts: 0 %
Eosinophils Absolute: 0 10*3/uL (ref 0.0–0.7)
Eosinophils Relative: 0 % (ref 0–5)
HCT: 37.7 % (ref 36.0–46.0)
Hemoglobin: 12.8 g/dL (ref 12.0–15.0)
Lymphocytes Relative: 6 % — ABNORMAL LOW (ref 12–46)
Lymphs Abs: 0.6 10*3/uL — ABNORMAL LOW (ref 0.7–4.0)
MCH: 29.3 pg (ref 26.0–34.0)
MCHC: 34 g/dL (ref 30.0–36.0)
MCV: 86.3 fL (ref 78.0–100.0)
Metamyelocytes Relative: 0 %
Monocytes Absolute: 0 10*3/uL — ABNORMAL LOW (ref 0.1–1.0)
Monocytes Relative: 0 % — ABNORMAL LOW (ref 3–12)
Myelocytes: 0 %
Neutro Abs: 9.1 10*3/uL — ABNORMAL HIGH (ref 1.7–7.7)
Neutrophils Relative %: 68 % (ref 43–77)
Platelets: 172 10*3/uL (ref 150–400)
Promyelocytes Absolute: 0 %
RBC: 4.37 MIL/uL (ref 3.87–5.11)
RDW: 13 % (ref 11.5–15.5)
WBC Morphology: INCREASED
WBC: 9.7 10*3/uL (ref 4.0–10.5)
nRBC: 0 /100 WBC

## 2012-12-27 LAB — URINALYSIS, ROUTINE W REFLEX MICROSCOPIC
Bilirubin Urine: NEGATIVE
Glucose, UA: NEGATIVE mg/dL
Ketones, ur: NEGATIVE mg/dL
Nitrite: NEGATIVE
Protein, ur: 30 mg/dL — AB
Specific Gravity, Urine: 1.021 (ref 1.005–1.030)
Urobilinogen, UA: 1 mg/dL (ref 0.0–1.0)
pH: 6 (ref 5.0–8.0)

## 2012-12-27 LAB — COMPREHENSIVE METABOLIC PANEL
ALT: 38 U/L — ABNORMAL HIGH (ref 0–35)
AST: 35 U/L (ref 0–37)
Albumin: 3.3 g/dL — ABNORMAL LOW (ref 3.5–5.2)
Alkaline Phosphatase: 64 U/L (ref 39–117)
BUN: 17 mg/dL (ref 6–23)
CO2: 24 mEq/L (ref 19–32)
Calcium: 8.8 mg/dL (ref 8.4–10.5)
Chloride: 96 mEq/L (ref 96–112)
Creatinine, Ser: 0.9 mg/dL (ref 0.50–1.10)
GFR calc Af Amer: 85 mL/min — ABNORMAL LOW (ref >90)
GFR calc non Af Amer: 73 mL/min — ABNORMAL LOW (ref >90)
Glucose, Bld: 116 mg/dL — ABNORMAL HIGH (ref 70–99)
Potassium: 3.3 mEq/L — ABNORMAL LOW (ref 3.5–5.1)
Sodium: 136 mEq/L (ref 135–145)
Total Bilirubin: 1.3 mg/dL — ABNORMAL HIGH (ref 0.3–1.2)
Total Protein: 7.2 g/dL (ref 6.0–8.3)

## 2012-12-27 LAB — TROPONIN I: Troponin I: <0.3 ng/mL (ref <0.30)

## 2012-12-27 LAB — URINE MICROSCOPIC-ADD ON

## 2012-12-27 LAB — LIPASE, BLOOD: Lipase: 8 U/L — ABNORMAL LOW (ref 11–59)

## 2012-12-27 LAB — CG4 I-STAT (LACTIC ACID): Lactic Acid, Venous: 3.46 mmol/L — ABNORMAL HIGH (ref 0.5–2.2)

## 2012-12-27 MED ORDER — PHENYLEPH-PROMETHAZINE-COD 5-6.25-10 MG/5ML PO SYRP: 5.0000 mL | ORAL_SOLUTION | Freq: Two times a day (BID) | ORAL | Status: DC | PRN

## 2012-12-27 MED ORDER — GI COCKTAIL ~~LOC~~
30.0000 mL | Freq: Once | ORAL | Status: AC
  Administered 2012-12-27: 30 mL via ORAL
  Filled 2012-12-27: qty 30

## 2012-12-27 MED ORDER — SODIUM CHLORIDE 0.9 % IV BOLUS (SEPSIS)
1000.0000 mL | Freq: Once | INTRAVENOUS | Status: AC
  Administered 2012-12-27: 1000 mL via INTRAVENOUS

## 2012-12-27 MED ORDER — HYDROMORPHONE HCL PF 1 MG/ML IJ SOLN
0.5000 mg | Freq: Once | INTRAMUSCULAR | Status: AC
  Administered 2012-12-27: 0.5 mg via INTRAVENOUS
  Filled 2012-12-27: qty 1

## 2012-12-27 MED ORDER — LEVOFLOXACIN 500 MG PO TABS: 500.0000 mg | ORAL_TABLET | Freq: Every day | ORAL | Status: AC

## 2012-12-27 MED ORDER — LEVOFLOXACIN IN D5W 500 MG/100ML IV SOLN
500.0000 mg | Freq: Once | INTRAVENOUS | Status: AC
  Administered 2012-12-27: 500 mg via INTRAVENOUS
  Filled 2012-12-27 (×2): qty 100

## 2012-12-27 MED ORDER — ONDANSETRON HCL 4 MG/2ML IJ SOLN
4.0000 mg | Freq: Once | INTRAMUSCULAR | Status: AC
  Administered 2012-12-27: 4 mg via INTRAVENOUS
  Filled 2012-12-27: qty 2

## 2012-12-27 NOTE — ED Notes (Signed)
Pt c/o shortness of breath, chest congestion, vomiting and diarrhea. Pt sts she had the flu last week and felt better but then developed new symptoms Wed night. Pt sts 95% and speaking in full sentences.

## 2012-12-27 NOTE — ED Provider Notes (Signed)
CSN: [id number]     Arrival date & time 12/27/12  4696 History   First MD Initiated Contact with Patient 12/27/12 [phone]     Chief Complaint  Patient presents with  . Emesis  . Shortness of Breath    HPI  Patient presents with dyspnea, congestion, nausea, vomiting, diarrhea, abdominal pain. Patient notes that one week ago she was clinically diagnosed with the flu, was prescribed amoxicillin, and has been taking the medication since that time.  She states that her URI like symptoms improved until yesterday.  Since yesterday she has had persistent discomfort.  Symptoms have been marginally better, after becoming severe soon after onset.  She also complains of chills, denies fever or denies confusion, disorientation. No relief with anything. The abdominal pain is left sided, nonradiating, sore, worse after vomiting.   Past Medical History  Diagnosis Date  . Hypertension   . Migraines   . Depression   . Anxiety    Past Surgical History  Procedure Laterality Date  . Tonsillectomy and adenoidectomy    . Laparoscopic abdominal exploration  1994    endometriosis  . Dilation and curettage of uterus     Family History  Problem Relation Age of Onset  . Breast cancer Mother   . Diabetes Mother   . Cancer Father     colon  . Diabetes Father    History  Substance Use Topics  . Smoking status: Never Smoker   . Smokeless tobacco: Not on file  . Alcohol Use: 1.2 oz/week    2 Cans of beer per week   OB History   Grav Para Term Preterm Abortions TAB SAB Ect Mult Living   2 0 0 0 2  2   0     Review of Systems  Constitutional:       Per HPI, otherwise negative  HENT:       Per HPI, otherwise negative  Respiratory:       Per HPI, otherwise negative  Cardiovascular:       Per HPI, otherwise negative  Gastrointestinal: Positive for nausea, vomiting and diarrhea.  Endocrine:       Negative aside from HPI  Genitourinary:       Neg aside from HPI   Musculoskeletal:       Per  HPI, otherwise negative  Skin: Negative.   Neurological: Negative for syncope.    Allergies  Review of patient's allergies indicates no known allergies.  Home Medications   Current Outpatient Rx  Name  Route  Sig  Dispense  Refill  . Asenapine Maleate (SAPHRIS SL)   Sublingual   Place under the tongue. 2 at bedtime         . buPROPion (WELLBUTRIN XL) 150 MG 24 hr tablet      3 (three) times daily.         . DULoxetine (CYMBALTA) 60 MG capsule   Oral   Take 60 mg by mouth daily.           Marland Kitchen gabapentin (NEURONTIN) 300 MG capsule      Take one tablet at night for the next 3 nights and if tolerated take one in the morning and one at bedtime.   60 capsule   1   . meloxicam (MOBIC) 7.5 MG tablet      Take 1-2 tablets each morning for pain.   30 tablet   1   . metaxalone (SKELAXIN) 800 MG tablet   Oral   Take  1 tablet (800 mg total) by mouth 3 (three) times daily. As needed for muscle spasm   30 tablet   0   . Mirtazapine (REMERON PO)   Oral   Take by mouth daily.          . Sertraline HCl (ZOLOFT PO)   Oral   Take by mouth 2 (two) times daily.           BP 121/80  Pulse 115  Temp(Src) 98.3 F (36.8 C) (Oral)  Resp 20  SpO2 93%  LMP 01/31/2007 Physical Exam  Nursing note and vitals reviewed. Constitutional: She is oriented to person, place, and time. She appears well-developed and well-nourished. No distress.  HENT:  Head: Normocephalic and atraumatic.  Eyes: Conjunctivae and EOM are normal.  Cardiovascular: Regular rhythm.  Tachycardia present.   Pulmonary/Chest: Effort normal and breath sounds normal. No stridor. No respiratory distress.  Abdominal: Soft. Normal appearance. She exhibits no distension. There is tenderness in the epigastric area and left upper quadrant. There is no rigidity, no rebound and no guarding.  Musculoskeletal: She exhibits no edema.  Neurological: She is alert and oriented to person, place, and time. No cranial nerve  deficit.  Skin: Skin is warm and dry.  Psychiatric: She has a normal mood and affect.    ED Course  Procedures (including critical care time) Labs Review Labs Reviewed  CBC WITH DIFFERENTIAL  COMPREHENSIVE METABOLIC PANEL  LIPASE, BLOOD  URINALYSIS, ROUTINE W REFLEX MICROSCOPIC  TROPONIN I   Imaging Review No results found.  EKG Interpretation   None      Lactic Acid 3.46, IVF were started empirically  2:08 PM Patient states that she is feeling better.  No new complaints.  I had a lengthy conversation with her about her diagnosis, need for antibiotics.  Recommended admission, the patient defers saying that she has home commitments.  I reiterated the importance of close monitoring.  MDM   1. CAP (community acquired pneumonia)    This patient presents with multiple complaints.  Notably, the patient was recently treated for URI like illness with amoxicillin.  Today the patient has both ongoing cough, congestion as well as nausea, vomiting, diarrhea with left-sided abdominal pain.  With initial urinalysis demonstrating hematuria, CT scan was performed.  This, x-ray, labs all suggest pneumonia as likely etiology for her complaints.  The patient received fluids, initial antibiotics, felt better.  Patient preferred discharge.  With the absence of distress, though she remained mildly tachycardic, this seems reasonable.  She was discharged with explicit return precautions, f/u instructions.    Gerhard Munch, MD 12/27/12 [phone]

## 2012-12-28 LAB — URINE CULTURE: Colony Count: 50000

## 2013-03-14 ENCOUNTER — Telehealth: Payer: Self-pay | Admitting: Family Medicine

## 2013-03-14 ENCOUNTER — Ambulatory Visit: Payer: BC Managed Care – PPO

## 2013-03-14 ENCOUNTER — Inpatient Hospital Stay
Admission: RE | Admit: 2013-03-14 | Discharge: 2013-03-14 | Disposition: A | Discharge status: Home / Self Care | Payer: Self-pay | Source: Ambulatory Visit | Attending: Family Medicine | Admitting: Family Medicine

## 2013-03-14 ENCOUNTER — Ambulatory Visit (INDEPENDENT_AMBULATORY_CARE_PROVIDER_SITE_OTHER): Payer: BC Managed Care – PPO | Admitting: Family Medicine

## 2013-03-14 VITALS — BP 138/86 | HR 100 | Temp 98.8°F | Resp 16 | Ht 65.5 in | Wt 174.0 lb

## 2013-03-14 DIAGNOSIS — R5383 Other fatigue: Secondary | ICD-10-CM

## 2013-03-14 DIAGNOSIS — R1319 Other dysphagia: Secondary | ICD-10-CM

## 2013-03-14 DIAGNOSIS — R000 Tachycardia, unspecified: Secondary | ICD-10-CM

## 2013-03-14 DIAGNOSIS — R5381 Other malaise: Secondary | ICD-10-CM

## 2013-03-14 DIAGNOSIS — R059 Cough, unspecified: Secondary | ICD-10-CM

## 2013-03-14 DIAGNOSIS — R05 Cough: Secondary | ICD-10-CM

## 2013-03-14 LAB — POCT CBC
Granulocyte percent: 74 %G (ref 37–80)
HCT, POC: 42.9 % (ref 37.7–47.9)
Hemoglobin: 13.5 g/dL (ref 12.2–16.2)
Lymph, poc: 0.9 (ref 0.6–3.4)
MCH, POC: 28.9 pg (ref 27–31.2)
MCHC: 31.5 g/dL — AB (ref 31.8–35.4)
MCV: 91.9 fL (ref 80–97)
MID (cbc): 0.3 (ref 0–0.9)
MPV: 10.3 fL (ref 0–99.8)
POC Granulocyte: 3.5 (ref 2–6.9)
POC LYMPH PERCENT: 18.9 %L (ref 10–50)
POC MID %: 7.1 %M (ref 0–12)
Platelet Count, POC: 189 10*3/uL (ref 142–424)
RBC: 4.67 M/uL (ref 4.04–5.48)
RDW, POC: 14 %
WBC: 4.7 10*3/uL (ref 4.6–10.2)

## 2013-03-14 LAB — POCT URINE PREGNANCY: Preg Test, Ur: NEGATIVE

## 2013-03-14 LAB — D-DIMER, QUANTITATIVE: D-Dimer, Quant: 0.39 ug/mL-FEU (ref 0.00–0.48)

## 2013-03-14 LAB — POCT INFLUENZA A/B
Influenza A, POC: NEGATIVE
Influenza B, POC: NEGATIVE

## 2013-03-14 MED ORDER — DOXYCYCLINE HYCLATE 100 MG PO CAPS: 100.0000 mg | ORAL_CAPSULE | Freq: Two times a day (BID) | ORAL | Status: DC

## 2013-03-14 MED ORDER — CEFDINIR 300 MG PO CAPS: 300.0000 mg | ORAL_CAPSULE | Freq: Two times a day (BID) | ORAL | Status: DC

## 2013-03-14 NOTE — Telephone Encounter (Signed)
Patient called stating she was told she would get a phone call today regarding her xrays Would like a call back on her home phone

## 2013-03-14 NOTE — Telephone Encounter (Signed)
addressed

## 2013-03-14 NOTE — Patient Instructions (Signed)
I will give you a call in a few hours with your D dimer result.  If positive we will move ahead with a CT angiogram.  Assuming D dimer is negative we will go ahead and treat you with the antibiotic- omnicef

## 2013-03-14 NOTE — Progress Notes (Signed)
[facility] [address] [place] [address] [phone]  Date:  03/14/2013   Name:  Tiffany Horn   DOB:  28-Oct-1960   MRN:  [id number]  PCP:  Tommy Medal, MD    Chief Complaint: Cough   History of Present Illness:  Tiffany Horn is a 53 y.o. very pleasant female patient who presents with the following:  December 2- 4th 2014 she was in the hospital in [place], [place] with pneumonia.  She was allowed home with her mother and was "on bed rest" for 2 weeks.  She then returned to her old PCP in [place] and "I still had it," had to rest 2 more weeks.   She finally recovered and was able to get back to work in early January  This past week she felt tired after work, but did not have any other particular sx. Yesterday she noted return of cough and congestion. This am she noted a deep, painful cough again and is "petrified it is going to turn into pneumonia." She felt warm last night but did not check her temp- she is having chills.   She notes body aches, feels very tired.   She does note a stuffy nose but no ST.   No GI symptoms.   She does feel a little SOB when she walks, especially up stairs.  She just noted this yesterday.   No CP except with cough She is coughing up a little phlegm- no blood.    No hisptory of DVT or PE, not a smoker, she is not on hormones at all.  No immobilization except for her recent period of "bed rest."  Menopausal for about 7 years.    She also mentions that she has had difficulty swallowing solids only (not liquids) for the last couple of weeks- food seems to catch in her throat.  She is concerned about this.  Also is due for a colonoscopy.    There are no active problems to display for this patient.   Past Medical History  Diagnosis Date  . Hypertension   . Migraines   . Depression   . Anxiety     Past Surgical History  Procedure Laterality Date  . Tonsillectomy and adenoidectomy    . Laparoscopic abdominal exploration   1994    endometriosis  . Dilation and curettage of uterus      History  Substance Use Topics  . Smoking status: Never Smoker   . Smokeless tobacco: Not on file  . Alcohol Use: 1.2 oz/week    2 Cans of beer per week    Family History  Problem Relation Age of Onset  . Breast cancer Mother   . Diabetes Mother   . Cancer Father     colon  . Diabetes Father     No Known Allergies  Medication list has been reviewed and updated.  Current Outpatient Prescriptions on File Prior to Visit  Medication Sig Dispense Refill  . buPROPion (WELLBUTRIN XL) 150 MG 24 hr tablet 3 (three) times daily.      . Sertraline HCl (ZOLOFT PO) Take by mouth 2 (two) times daily.       . DULoxetine (CYMBALTA) 60 MG capsule Take 60 mg by mouth daily.        Marland Kitchen gabapentin (NEURONTIN) 300 MG capsule Take one tablet at night for the next 3 nights and if tolerated take one in the morning and one at bedtime.  60 capsule  1  .  meloxicam (MOBIC) 7.5 MG tablet Take 1-2 tablets each morning for pain.  30 tablet  1  . Mirtazapine (REMERON PO) Take by mouth daily.       Marland Kitchen Phenyleph-Promethazine-Cod 5-6.25-10 MG/5ML SYRP Take 5 mLs by mouth every 12 (twelve) hours as needed.  120 mL  0  . [DISCONTINUED] Asenapine Maleate (SAPHRIS SL) Place under the tongue. 2 at bedtime       No current facility-administered medications on file prior to visit.    Review of Systems:  As per HPI- otherwise negative.   Physical Examination: Filed Vitals:   03/14/13 0937  BP: 138/86  Pulse: 100  Temp: 98.8 F (37.1 C)  Resp: 16   Filed Vitals:   03/14/13 0937  Height: 5' 5.5" (1.664 m)  Weight: 174 lb (78.926 kg)   Body mass index is 28.5 kg/(m^2). Ideal Body Weight: Weight in (lb) to have BMI = 25: 152.2  GEN: WDWN, NAD, Non-toxic, A & O x 3, looks well HEENT: Atraumatic, Normocephalic. Neck supple. No masses, No LAD.  Bilateral TM wnl, oropharynx normal.  PEERL,EOMI.  Ears and Nose: No external deformity. CV: RRR, No  M/G/R. No JVD. No thrill. No extra heart sounds. PULM: CTA B, no wheezes, crackles, rhonchi. No retractions. No resp. distress. No accessory muscle use. ABD: S, NT, ND, +BS. No rebound. No HSM. EXTR: No c/c/e NEURO Normal gait.  PSYCH: Normally interactive. Conversant. Not depressed or anxious appearing.  Calm demeanor.  No calf swelling or tenderness  Wt Readings from Last 3 Encounters:  03/14/13 174 lb (78.926 kg)  11/01/12 179 lb (81.194 kg)  10/29/12 184 lb (83.462 kg)    [facility] reading (PRIMARY) by  Dr. Lorelei Pont. CXR: RUL infiltrate vs scarring- pt had pneumonia and was hospitalized in early December but we do not have these records. CHEST 2 VIEW  COMPARISON: December 31, 2012  FINDINGS: The left lung is now clear. There is, however, persistent infiltrate in the posterior segment of the right upper lobe. Elsewhere, the right lung appears clear. Heart size and pulmonary vascularity are normal. No adenopathy. No bone lesions.  IMPRESSION: Persistent right upper lobe infiltrate consistent with pneumonia. Elsewhere, lungs are now clear. No adenopathy appreciable.  Radiology was able to compare her current film with her film from December as above.   Results for orders placed in visit on 03/14/13  D-DIMER, QUANTITATIVE      Result Value Ref Range   D-Dimer, Quant 0.39  0.00 - 0.48 ug/mL-FEU  POCT CBC      Result Value Ref Range   WBC 4.7  4.6 - 10.2 K/uL   Lymph, poc 0.9  0.6 - 3.4   POC LYMPH PERCENT 18.9  10 - 50 %L   MID (cbc) 0.3  0 - 0.9   POC MID % 7.1  0 - 12 %M   POC Granulocyte 3.5  2 - 6.9   Granulocyte percent 74.0  37 - 80 %G   RBC 4.67  4.04 - 5.48 M/uL   Hemoglobin 13.5  12.2 - 16.2 g/dL   HCT, POC 42.9  37.7 - 47.9 %   MCV 91.9  80 - 97 fL   MCH, POC 28.9  27 - 31.2 pg   MCHC 31.5 (*) 31.8 - 35.4 g/dL   RDW, POC 14.0     Platelet Count, POC 189  142 - 424 K/uL   MPV 10.3  0 - 99.8 fL  POCT INFLUENZA A/B  Result Value Ref Range   Influenza A, POC  Negative     Influenza B, POC Negative    POCT URINE PREGNANCY      Result Value Ref Range   Preg Test, Ur Negative       Assessment and Plan: Cough - Plan: DG Chest 2 View, DG Chest 2 View, POCT urine pregnancy, cefdinir (OMNICEF) 300 MG capsule, DISCONTINUED: doxycycline (VIBRAMYCIN) 100 MG capsule  Other malaise and fatigue - Plan: POCT CBC, POCT Influenza A/B, DG Chest 2 View  Tachycardia - Plan: DG Chest 2 View, D-dimer, quantitative  Other dysphagia - Plan: Ambulatory referral to Gastroenterology  Pneumonia- treat with omnicef. She thinks she was sent home on levaquin after her last hospitalization.  Checked D dimer due to SOB and tachycardia- negative Plan to repeat CXR in 3 months to ensure it clears. Referral to GI to evaluate her swallowing difficulty  Cautioned her to keep a close eye on her symptoms.  If fever or if getting worse please let me know right away!   Signed Lamar Blinks, MD

## 2013-03-21 ENCOUNTER — Emergency Department (HOSPITAL_BASED_OUTPATIENT_CLINIC_OR_DEPARTMENT_OTHER)
Admission: EM | Admit: 2013-03-21 | Discharge: 2013-03-21 | Disposition: A | Discharge status: Home / Self Care | Payer: BC Managed Care – PPO | Attending: Emergency Medicine | Admitting: Emergency Medicine

## 2013-03-21 ENCOUNTER — Encounter (HOSPITAL_BASED_OUTPATIENT_CLINIC_OR_DEPARTMENT_OTHER): Payer: Self-pay | Admitting: Emergency Medicine

## 2013-03-21 ENCOUNTER — Emergency Department (HOSPITAL_BASED_OUTPATIENT_CLINIC_OR_DEPARTMENT_OTHER): Payer: BC Managed Care – PPO

## 2013-03-21 DIAGNOSIS — F3289 Other specified depressive episodes: Secondary | ICD-10-CM | POA: Insufficient documentation

## 2013-03-21 DIAGNOSIS — W010XXA Fall on same level from slipping, tripping and stumbling without subsequent striking against object, initial encounter: Secondary | ICD-10-CM | POA: Insufficient documentation

## 2013-03-21 DIAGNOSIS — Z792 Long term (current) use of antibiotics: Secondary | ICD-10-CM | POA: Insufficient documentation

## 2013-03-21 DIAGNOSIS — Y9389 Activity, other specified: Secondary | ICD-10-CM | POA: Insufficient documentation

## 2013-03-21 DIAGNOSIS — S42213A Unspecified displaced fracture of surgical neck of unspecified humerus, initial encounter for closed fracture: Secondary | ICD-10-CM

## 2013-03-21 DIAGNOSIS — F329 Major depressive disorder, single episode, unspecified: Secondary | ICD-10-CM | POA: Insufficient documentation

## 2013-03-21 DIAGNOSIS — G43909 Migraine, unspecified, not intractable, without status migrainosus: Secondary | ICD-10-CM | POA: Insufficient documentation

## 2013-03-21 DIAGNOSIS — Z79899 Other long term (current) drug therapy: Secondary | ICD-10-CM | POA: Insufficient documentation

## 2013-03-21 DIAGNOSIS — Y929 Unspecified place or not applicable: Secondary | ICD-10-CM | POA: Insufficient documentation

## 2013-03-21 DIAGNOSIS — I10 Essential (primary) hypertension: Secondary | ICD-10-CM | POA: Insufficient documentation

## 2013-03-21 DIAGNOSIS — Z8701 Personal history of pneumonia (recurrent): Secondary | ICD-10-CM | POA: Insufficient documentation

## 2013-03-21 DIAGNOSIS — F411 Generalized anxiety disorder: Secondary | ICD-10-CM | POA: Insufficient documentation

## 2013-03-21 MED ORDER — OXYCODONE-ACETAMINOPHEN 5-325 MG PO TABS
2.0000 | ORAL_TABLET | Freq: Once | ORAL | Status: AC
  Administered 2013-03-21: 2 via ORAL
  Filled 2013-03-21 (×2): qty 2

## 2013-03-21 MED ORDER — OXYCODONE-ACETAMINOPHEN 5-325 MG PO TABS: 2.0000 | ORAL_TABLET | ORAL | Status: DC | PRN

## 2013-03-21 NOTE — ED Notes (Signed)
Reports slipped and fell on ice- c/o left shoulder pain- guarding- states too painful to move- left radial pulse present, hand warm, cap refill <3sec

## 2013-03-21 NOTE — ED Notes (Signed)
Pt does not have friends/family at bedside, informed pt that in order to administer Percocet that was ordered per EDP she would need a driver - pt calling several people at this time. Instructed pt to inform RN when driver available and medications would be administered per order.

## 2013-03-21 NOTE — Discharge Instructions (Signed)
Humerus Fracture, Treated with Immobilization Follow up with Dr. Ninfa Linden next week. Return to the ED if you develop new or worsening symptoms. The humerus is the large bone in your upper arm. You have a broken (fractured) humerus. These fractures are easily diagnosed with X-rays. TREATMENT  Simple fractures which will heal without disability are treated with simple immobilization. Immobilization means you will wear a cast, splint, or sling. You have a fracture which will do well with immobilization. The fracture will heal well simply by being held in a good position until it is stable enough to begin range of motion exercises. Do not take part in activities which would further injure your arm.  HOME CARE INSTRUCTIONS   Put ice on the injured area.  Put ice in a plastic bag.  Place a towel between your skin and the bag.  Leave the ice on for 15-20 minutes, 03-04 times a day.  If you have a cast:  Do not scratch the skin under the cast using sharp or pointed objects.  Check the skin around the cast every day. You may put lotion on any red or sore areas.  Keep your cast dry and clean.  If you have a splint:  Wear the splint as directed.  Keep your splint dry and clean.  You may loosen the elastic around the splint if your fingers become numb, tingle, or turn cold or blue.  If you have a sling:  Wear the sling as directed.  Do not put pressure on any part of your cast or splint until it is fully hardened.  Your cast or splint can be protected during bathing with a plastic bag. Do not lower the cast or splint into water.  Only take over-the-counter or prescription medicines for pain, discomfort, or fever as directed by your caregiver.  Do range of motion exercises as instructed by your caregiver.  Follow up as directed by your caregiver. This is very important in order to avoid permanent injury or disability and chronic pain. SEEK IMMEDIATE MEDICAL CARE IF:   Your skin or  nails in the injured arm turn blue or gray.  Your arm feels cold or numb.  You develop severe pain in the injured arm.  You are having problems with the medicines you were given. MAKE SURE YOU:   Understand these instructions.  Will watch your condition.  Will get help right away if you are not doing well or get worse. Document Released: 04/24/2000 Document Revised: 04/10/2011 Document Reviewed: 03/02/2010 [facility] Patient Information 2014 [place].

## 2013-03-21 NOTE — ED Notes (Signed)
Pt has driver

## 2013-03-21 NOTE — ED Provider Notes (Signed)
CSN: [id number]     Arrival date & time 03/21/13  1912 History  This chart was scribed for Tiffany Essex, MD by Anastasia Pall, ED Scribe. This patient was seen in room MH10/MH10 and the patient's care was started at 7:23 PM.   Chief Complaint  Patient presents with  . Shoulder Pain  . Fall   (Consider location/radiation/quality/duration/timing/severity/associated sxs/prior Treatment) The history is provided by the patient. No language interpreter was used.   HPI Comments: Kelse Ploch is a 53 y.o. female who presents to the Emergency Department complaining of constant, left shoulder pain, onset earlier this evening when she slipped on ice and fell on her shoulder. She reports decreased ROM to her left arm, and states movement of her left arm exacerbates her shoulder pain. She denies LOC, other joint pain, wounds, numbness, tingling, neck pain, back pain, and any other associated symptoms. She reports having recently been diagnosed with pneumonia, is taking medication. She denies h/o DM, any other medical history.   PCP Tommy Medal, MD  Past Medical History  Diagnosis Date  . Hypertension   . Migraines   . Depression   . Anxiety    Past Surgical History  Procedure Laterality Date  . Tonsillectomy and adenoidectomy    . Laparoscopic abdominal exploration  1994    endometriosis  . Dilation and curettage of uterus     Family History  Problem Relation Age of Onset  . Breast cancer Mother   . Diabetes Mother   . Cancer Father     colon  . Diabetes Father    History  Substance Use Topics  . Smoking status: Never Smoker   . Smokeless tobacco: Never Used  . Alcohol Use: 1.2 oz/week    2 Cans of beer per week   OB History   Grav Para Term Preterm Abortions TAB SAB Ect Mult Living   2 0 0 0 2  2   0     Review of Systems A complete 10 system review of systems was obtained and all systems are negative except as noted in the HPI and PMH.   Allergies  Review of patient's  allergies indicates no known allergies.  Home Medications   Current Outpatient Rx  Name  Route  Sig  Dispense  Refill  . buPROPion (WELLBUTRIN XL) 150 MG 24 hr tablet      3 (three) times daily.         . cefdinir (OMNICEF) 300 MG capsule   Oral   Take 1 capsule (300 mg total) by mouth 2 (two) times daily.   20 capsule   0   . Mirtazapine (REMERON PO)   Oral   Take by mouth daily.          . Sertraline HCl (ZOLOFT PO)   Oral   Take by mouth 2 (two) times daily.          Marland Kitchen oxyCODONE-acetaminophen (PERCOCET/ROXICET) 5-325 MG per tablet   Oral   Take 2 tablets by mouth every 4 (four) hours as needed for severe pain.   15 tablet   0   . Phenyleph-Promethazine-Cod 5-6.25-10 MG/5ML SYRP   Oral   Take 5 mLs by mouth every 12 (twelve) hours as needed.   120 mL   0    BP 139/88  Pulse 103  Temp(Src) 98.3 F (36.8 C) (Oral)  Resp 20  Ht 5\' 6"  (1.676 m)  Wt 170 lb (77.111 kg)  BMI 27.45 kg/m2  SpO2 93%  LMP 01/31/2007  Physical Exam  Nursing note and vitals reviewed. Constitutional: She is oriented to person, place, and time. She appears well-developed and well-nourished. No distress.  HENT:  Head: Normocephalic and atraumatic.  Eyes: EOM are normal.  Neck: Neck supple. No spinous process tenderness and no muscular tenderness present.  No C-spine tenderness.   Cardiovascular: Normal rate.   Pulmonary/Chest: Effort normal. No respiratory distress.  Abdominal: Soft. There is no tenderness.  Musculoskeletal: Normal range of motion. She exhibits tenderness.       Cervical back: Normal.  Anterior and lateral shoulder tenderness. No bovius dislocation. Clavicle appears intact. +2 radial pulse. Cardinal hand movements intact. FROM of left elbow and hand.   Neurological: She is alert and oriented to person, place, and time. No sensory deficit.  Axillary nerve sensation intact.   Skin: Skin is warm and dry.  Psychiatric: She has a normal mood and affect. Her  behavior is normal.   ED Course  Procedures (including critical care time)  DIAGNOSTIC STUDIES: Oxygen Saturation is 93% on room air, adequate by my interpretation.    COORDINATION OF CARE: 7:26 PM-Discussed treatment plan which includes DG right shoulder and pain medication with pt at bedside and pt agreed to plan.   Results for orders placed in visit on 03/14/13  D-DIMER, QUANTITATIVE      Result Value Ref Range   D-Dimer, Quant 0.39  0.00 - 0.48 ug/mL-FEU  POCT CBC      Result Value Ref Range   WBC 4.7  4.6 - 10.2 K/uL   Lymph, poc 0.9  0.6 - 3.4   POC LYMPH PERCENT 18.9  10 - 50 %L   MID (cbc) 0.3  0 - 0.9   POC MID % 7.1  0 - 12 %M   POC Granulocyte 3.5  2 - 6.9   Granulocyte percent 74.0  37 - 80 %G   RBC 4.67  4.04 - 5.48 M/uL   Hemoglobin 13.5  12.2 - 16.2 g/dL   HCT, POC 42.9  37.7 - 47.9 %   MCV 91.9  80 - 97 fL   MCH, POC 28.9  27 - 31.2 pg   MCHC 31.5 (*) 31.8 - 35.4 g/dL   RDW, POC 14.0     Platelet Count, POC 189  142 - 424 K/uL   MPV 10.3  0 - 99.8 fL  POCT INFLUENZA A/B      Result Value Ref Range   Influenza A, POC Negative     Influenza B, POC Negative    POCT URINE PREGNANCY      Result Value Ref Range   Preg Test, Ur Negative     Dg Shoulder Left  03/21/2013   CLINICAL DATA:  Fall, left shoulder pain  EXAM: LEFT SHOULDER - 2+ VIEW  COMPARISON:  03/14/2013 chest x-ray  FINDINGS: there is an acute displaced left proximal humerus surgical neck fracture. Fracture lines also noted throughout the humeral head. Limited exam because of positioning related to the fracture. No gross subluxation or dislocation. AC joint aligned. Visualized left lung clear.  IMPRESSION: Acute displaced left proximal humerus surgical neck and head complex fracture.   Electronically Signed   By: Daryll Brod M.D.   On: 03/21/2013 19:52      Medications  oxyCODONE-acetaminophen (PERCOCET/ROXICET) 5-325 MG per tablet 2 tablet (2 tablets Oral Given 03/21/13 2002)    MDM   Final  diagnoses:  Humeral surgical neck fracture    Mechanical fall on the  ice on and left shoulder. Did not hit head or lose consciousness. Denies any neck or back pain.  Proximal humerus tenderness to palpation. Intact radial pulse, cardinal hand movements and axillary nerve sensation. No open wounds.  X-ray shows acute proximal humerus surgical neck and complex head fracture. Discussed with Dr. Ninfa Linden who reviewed the images and agrees with sling and gravity traction.  He will see in the office this week. Patient stable for discharge with pain control.  BP 144/97  Pulse 88  Temp(Src) 98.3 F (36.8 C) (Oral)  Resp 16  Ht 5\' 6"  (1.676 m)  Wt 170 lb (77.111 kg)  BMI 27.45 kg/m2  SpO2 98%  LMP 01/31/2007   I personally performed the services described in this documentation, which was scribed in my presence. The recorded information has been reviewed and is accurate.    Tiffany Essex, MD 03/21/13 [phone]

## 2013-03-31 ENCOUNTER — Encounter (HOSPITAL_COMMUNITY): Payer: Self-pay | Admitting: *Deleted

## 2013-03-31 ENCOUNTER — Other Ambulatory Visit (HOSPITAL_COMMUNITY): Payer: Self-pay | Admitting: Orthopaedic Surgery

## 2013-03-31 ENCOUNTER — Encounter (HOSPITAL_COMMUNITY): Payer: Self-pay | Admitting: Pharmacy Technician

## 2013-03-31 ENCOUNTER — Other Ambulatory Visit (HOSPITAL_COMMUNITY): Payer: Self-pay

## 2013-03-31 ENCOUNTER — Other Ambulatory Visit: Payer: Self-pay | Admitting: Orthopaedic Surgery

## 2013-03-31 ENCOUNTER — Ambulatory Visit
Admission: RE | Admit: 2013-03-31 | Discharge: 2013-03-31 | Disposition: A | Discharge status: Home / Self Care | Payer: BC Managed Care – PPO | Source: Ambulatory Visit | Attending: Orthopaedic Surgery | Admitting: Orthopaedic Surgery

## 2013-03-31 DIAGNOSIS — Z01818 Encounter for other preprocedural examination: Secondary | ICD-10-CM

## 2013-03-31 MED ORDER — CEFAZOLIN SODIUM-DEXTROSE 2-3 GM-% IV SOLR
2.0000 g | INTRAVENOUS | Status: AC
  Administered 2013-04-01: 2 g via INTRAVENOUS
  Filled 2013-03-31: qty 50

## 2013-04-01 ENCOUNTER — Observation Stay (HOSPITAL_COMMUNITY): Payer: BC Managed Care – PPO

## 2013-04-01 ENCOUNTER — Encounter (HOSPITAL_COMMUNITY)
Admission: RE | Disposition: A | Discharge status: Home / Self Care | Payer: Self-pay | Source: Ambulatory Visit | Attending: Orthopaedic Surgery

## 2013-04-01 ENCOUNTER — Encounter (HOSPITAL_COMMUNITY): Payer: Self-pay | Admitting: *Deleted

## 2013-04-01 ENCOUNTER — Ambulatory Visit (HOSPITAL_COMMUNITY): Payer: BC Managed Care – PPO | Admitting: Anesthesiology

## 2013-04-01 ENCOUNTER — Observation Stay (HOSPITAL_COMMUNITY)
Admission: RE | Admit: 2013-04-01 | Discharge: 2013-04-03 | Disposition: A | Discharge status: Home / Self Care | Payer: BC Managed Care – PPO | Source: Ambulatory Visit | Attending: Orthopaedic Surgery | Admitting: Orthopaedic Surgery

## 2013-04-01 ENCOUNTER — Ambulatory Visit (HOSPITAL_COMMUNITY): Payer: BC Managed Care – PPO

## 2013-04-01 ENCOUNTER — Encounter (HOSPITAL_COMMUNITY): Payer: BC Managed Care – PPO | Admitting: Anesthesiology

## 2013-04-01 DIAGNOSIS — M479 Spondylosis, unspecified: Secondary | ICD-10-CM | POA: Insufficient documentation

## 2013-04-01 DIAGNOSIS — S42213A Unspecified displaced fracture of surgical neck of unspecified humerus, initial encounter for closed fracture: Principal | ICD-10-CM | POA: Insufficient documentation

## 2013-04-01 DIAGNOSIS — F3289 Other specified depressive episodes: Secondary | ICD-10-CM | POA: Insufficient documentation

## 2013-04-01 DIAGNOSIS — W010XXA Fall on same level from slipping, tripping and stumbling without subsequent striking against object, initial encounter: Secondary | ICD-10-CM | POA: Insufficient documentation

## 2013-04-01 DIAGNOSIS — F411 Generalized anxiety disorder: Secondary | ICD-10-CM | POA: Insufficient documentation

## 2013-04-01 DIAGNOSIS — S42202A Unspecified fracture of upper end of left humerus, initial encounter for closed fracture: Secondary | ICD-10-CM | POA: Diagnosis present

## 2013-04-01 DIAGNOSIS — G43909 Migraine, unspecified, not intractable, without status migrainosus: Secondary | ICD-10-CM | POA: Insufficient documentation

## 2013-04-01 DIAGNOSIS — I10 Essential (primary) hypertension: Secondary | ICD-10-CM | POA: Insufficient documentation

## 2013-04-01 DIAGNOSIS — IMO0002 Reserved for concepts with insufficient information to code with codable children: Secondary | ICD-10-CM

## 2013-04-01 DIAGNOSIS — F329 Major depressive disorder, single episode, unspecified: Secondary | ICD-10-CM | POA: Insufficient documentation

## 2013-04-01 DIAGNOSIS — M1710 Unilateral primary osteoarthritis, unspecified knee: Secondary | ICD-10-CM | POA: Insufficient documentation

## 2013-04-01 HISTORY — DX: Other specified postprocedural states: Z98.890

## 2013-04-01 HISTORY — DX: Adverse effect of unspecified anesthetic, initial encounter: T41.45XA

## 2013-04-01 HISTORY — PX: ORIF HUMERUS FRACTURE: SHX2126

## 2013-04-01 HISTORY — DX: Personal history of other diseases of the digestive system: Z87.19

## 2013-04-01 HISTORY — DX: Other complications of anesthesia, initial encounter: T88.59XA

## 2013-04-01 HISTORY — DX: Nausea with vomiting, unspecified: R11.2

## 2013-04-01 HISTORY — DX: Unspecified osteoarthritis, unspecified site: M19.90

## 2013-04-01 HISTORY — DX: Pneumonia, unspecified organism: J18.9

## 2013-04-01 LAB — BASIC METABOLIC PANEL
BUN: 9 mg/dL (ref 6–23)
CO2: 26 mEq/L (ref 19–32)
Calcium: 8.9 mg/dL (ref 8.4–10.5)
Chloride: 106 mEq/L (ref 96–112)
Creatinine, Ser: 0.69 mg/dL (ref 0.50–1.10)
GFR calc Af Amer: >90 mL/min (ref >90)
GFR calc non Af Amer: >90 mL/min (ref >90)
Glucose, Bld: 90 mg/dL (ref 70–99)
Potassium: 3.3 mEq/L — ABNORMAL LOW (ref 3.7–5.3)
Sodium: 145 mEq/L (ref 137–147)

## 2013-04-01 LAB — CBC
HCT: 35.4 % — ABNORMAL LOW (ref 36.0–46.0)
Hemoglobin: 12 g/dL (ref 12.0–15.0)
MCH: 29.1 pg (ref 26.0–34.0)
MCHC: 33.9 g/dL (ref 30.0–36.0)
MCV: 85.9 fL (ref 78.0–100.0)
Platelets: 273 10*3/uL (ref 150–400)
RBC: 4.12 MIL/uL (ref 3.87–5.11)
RDW: 13.4 % (ref 11.5–15.5)
WBC: 6 10*3/uL (ref 4.0–10.5)

## 2013-04-01 SURGERY — OPEN REDUCTION INTERNAL FIXATION (ORIF) PROXIMAL HUMERUS FRACTURE
Anesthesia: General | Laterality: Left

## 2013-04-01 MED ORDER — PHENYLEPHRINE HCL 10 MG/ML IJ SOLN
INTRAMUSCULAR | Status: AC
  Filled 2013-04-01: qty 1

## 2013-04-01 MED ORDER — MORPHINE SULFATE 2 MG/ML IJ SOLN
1.0000 mg | INTRAMUSCULAR | Status: DC | PRN
  Administered 2013-04-02 (×2): 1 mg via INTRAVENOUS
  Filled 2013-04-01: qty 1

## 2013-04-01 MED ORDER — FENTANYL CITRATE 0.05 MG/ML IJ SOLN
INTRAMUSCULAR | Status: AC
  Filled 2013-04-01: qty 5

## 2013-04-01 MED ORDER — ROCURONIUM BROMIDE 50 MG/5ML IV SOLN
INTRAVENOUS | Status: AC
  Filled 2013-04-01: qty 1

## 2013-04-01 MED ORDER — NEOSTIGMINE METHYLSULFATE 1 MG/ML IJ SOLN
INTRAMUSCULAR | Status: AC
  Filled 2013-04-01: qty 10

## 2013-04-01 MED ORDER — BUPROPION HCL ER (XL) 150 MG PO TB24
150.0000 mg | ORAL_TABLET | Freq: Three times a day (TID) | ORAL | Status: DC
  Administered 2013-04-02 – 2013-04-03 (×2): 150 mg via ORAL
  Filled 2013-04-01 (×7): qty 1

## 2013-04-01 MED ORDER — HYDROCODONE-ACETAMINOPHEN 5-325 MG PO TABS
1.0000 | ORAL_TABLET | ORAL | Status: DC | PRN
  Administered 2013-04-01 – 2013-04-03 (×8): 2 via ORAL
  Filled 2013-04-01 (×8): qty 2

## 2013-04-01 MED ORDER — LIDOCAINE HCL (CARDIAC) 20 MG/ML IV SOLN
INTRAVENOUS | Status: AC
  Filled 2013-04-01: qty 5

## 2013-04-01 MED ORDER — PROPOFOL 10 MG/ML IV BOLUS
INTRAVENOUS | Status: AC
  Filled 2013-04-01: qty 20

## 2013-04-01 MED ORDER — ROCURONIUM BROMIDE 100 MG/10ML IV SOLN
INTRAVENOUS | Status: DC | PRN
  Administered 2013-04-01: 50 mg via INTRAVENOUS

## 2013-04-01 MED ORDER — CEFAZOLIN SODIUM 1-5 GM-% IV SOLN
1.0000 g | Freq: Four times a day (QID) | INTRAVENOUS | Status: AC
  Administered 2013-04-02 (×3): 1 g via INTRAVENOUS
  Filled 2013-04-01 (×5): qty 50

## 2013-04-01 MED ORDER — ONDANSETRON HCL 4 MG/2ML IJ SOLN
INTRAMUSCULAR | Status: DC | PRN
  Administered 2013-04-01: 4 mg via INTRAVENOUS

## 2013-04-01 MED ORDER — SODIUM CHLORIDE 0.9 % IR SOLN
Status: DC | PRN
  Administered 2013-04-01: 1000 mL

## 2013-04-01 MED ORDER — GLYCOPYRROLATE 0.2 MG/ML IJ SOLN
INTRAMUSCULAR | Status: AC
  Filled 2013-04-01: qty 3

## 2013-04-01 MED ORDER — SERTRALINE HCL 100 MG PO TABS
200.0000 mg | ORAL_TABLET | Freq: Every day | ORAL | Status: DC
  Administered 2013-04-02 – 2013-04-03 (×2): 200 mg via ORAL
  Filled 2013-04-01 (×2): qty 2

## 2013-04-01 MED ORDER — PROPOFOL 10 MG/ML IV BOLUS
INTRAVENOUS | Status: DC | PRN
  Administered 2013-04-01: 200 mg via INTRAVENOUS
  Administered 2013-04-01: 20 mg via INTRAVENOUS

## 2013-04-01 MED ORDER — GLYCOPYRROLATE 0.2 MG/ML IJ SOLN
INTRAMUSCULAR | Status: DC | PRN
  Administered 2013-04-01: 0.6 mg via INTRAVENOUS

## 2013-04-01 MED ORDER — HYDROMORPHONE HCL PF 1 MG/ML IJ SOLN
1.0000 mg | INTRAMUSCULAR | Status: DC | PRN
  Administered 2013-04-02 – 2013-04-03 (×5): 1 mg via INTRAVENOUS
  Filled 2013-04-01 (×6): qty 1

## 2013-04-01 MED ORDER — BUPIVACAINE-EPINEPHRINE PF 0.5-1:200000 % IJ SOLN
INTRAMUSCULAR | Status: DC | PRN
  Administered 2013-04-01: 30 mL via PERINEURAL

## 2013-04-01 MED ORDER — SODIUM CHLORIDE 0.9 % IV SOLN
INTRAVENOUS | Status: DC
  Administered 2013-04-01: 20:00:00 via INTRAVENOUS

## 2013-04-01 MED ORDER — METHOCARBAMOL 100 MG/ML IJ SOLN
500.0000 mg | Freq: Four times a day (QID) | INTRAVENOUS | Status: DC | PRN
  Filled 2013-04-01: qty 5

## 2013-04-01 MED ORDER — METOCLOPRAMIDE HCL 10 MG PO TABS
5.0000 mg | ORAL_TABLET | Freq: Three times a day (TID) | ORAL | Status: DC | PRN
Start: 2013-04-01 — End: 2013-04-03

## 2013-04-01 MED ORDER — LACTATED RINGERS IV SOLN
INTRAVENOUS | Status: DC | PRN
  Administered 2013-04-01 (×2): via INTRAVENOUS

## 2013-04-01 MED ORDER — ONDANSETRON HCL 4 MG/2ML IJ SOLN: 4.0000 mg | Freq: Four times a day (QID) | INTRAMUSCULAR | Status: DC | PRN

## 2013-04-01 MED ORDER — MIRTAZAPINE 30 MG PO TABS
30.0000 mg | ORAL_TABLET | Freq: Every day | ORAL | Status: DC
  Administered 2013-04-01 – 2013-04-02 (×2): 30 mg via ORAL
  Filled 2013-04-01 (×3): qty 1

## 2013-04-01 MED ORDER — ONDANSETRON HCL 4 MG PO TABS: 4.0000 mg | ORAL_TABLET | Freq: Four times a day (QID) | ORAL | Status: DC | PRN

## 2013-04-01 MED ORDER — OXYCODONE HCL 5 MG PO TABS
5.0000 mg | ORAL_TABLET | ORAL | Status: DC | PRN
  Administered 2013-04-02 – 2013-04-03 (×8): 10 mg via ORAL
  Filled 2013-04-01 (×8): qty 2

## 2013-04-01 MED ORDER — FENTANYL CITRATE 0.05 MG/ML IJ SOLN
INTRAMUSCULAR | Status: AC
Start: 2013-04-01 — End: 2013-04-01
  Administered 2013-04-01: 100 ug
  Filled 2013-04-01: qty 2

## 2013-04-01 MED ORDER — METOCLOPRAMIDE HCL 5 MG/ML IJ SOLN: 5.0000 mg | Freq: Three times a day (TID) | INTRAMUSCULAR | Status: DC | PRN

## 2013-04-01 MED ORDER — FENTANYL CITRATE 0.05 MG/ML IJ SOLN
INTRAMUSCULAR | Status: DC | PRN
  Administered 2013-04-01 (×2): 50 ug via INTRAVENOUS
  Administered 2013-04-01: 100 ug via INTRAVENOUS
  Administered 2013-04-01: 50 ug via INTRAVENOUS

## 2013-04-01 MED ORDER — MIDAZOLAM HCL 2 MG/2ML IJ SOLN
INTRAMUSCULAR | Status: AC
  Administered 2013-04-01: 2 mg
  Filled 2013-04-01: qty 2

## 2013-04-01 MED ORDER — LACTATED RINGERS IV SOLN
INTRAVENOUS | Status: DC
Start: 2013-04-01 — End: 2013-04-01
  Administered 2013-04-01: 13:00:00 via INTRAVENOUS

## 2013-04-01 MED ORDER — OXYCODONE-ACETAMINOPHEN 5-325 MG PO TABS: 1.0000 | ORAL_TABLET | ORAL | Status: DC | PRN

## 2013-04-01 MED ORDER — METHOCARBAMOL 500 MG PO TABS
500.0000 mg | ORAL_TABLET | Freq: Four times a day (QID) | ORAL | Status: DC | PRN
  Administered 2013-04-01 – 2013-04-03 (×6): 500 mg via ORAL
  Filled 2013-04-01 (×6): qty 1

## 2013-04-01 MED ORDER — SODIUM CHLORIDE 0.9 % IV SOLN
10.0000 mg | INTRAVENOUS | Status: DC | PRN
  Administered 2013-04-01: 10 ug/min via INTRAVENOUS

## 2013-04-01 MED ORDER — ONDANSETRON HCL 4 MG/2ML IJ SOLN
INTRAMUSCULAR | Status: AC
  Filled 2013-04-01: qty 2

## 2013-04-01 MED ORDER — LIDOCAINE HCL (CARDIAC) 20 MG/ML IV SOLN
INTRAVENOUS | Status: DC | PRN
  Administered 2013-04-01: 80 mg via INTRAVENOUS
  Administered 2013-04-01: 20 mg via INTRAVENOUS

## 2013-04-01 MED ORDER — DIPHENHYDRAMINE HCL 12.5 MG/5ML PO ELIX: 12.5000 mg | ORAL_SOLUTION | ORAL | Status: DC | PRN

## 2013-04-01 MED ORDER — NEOSTIGMINE METHYLSULFATE 1 MG/ML IJ SOLN
INTRAMUSCULAR | Status: DC | PRN
Start: 2013-04-01 — End: 2013-04-01
  Administered 2013-04-01: 4 mg via INTRAVENOUS

## 2013-04-01 SURGICAL SUPPLY — 48 items
DRAPE C-ARM 42X72 X-RAY (DRAPES) ×4 IMPLANT
DRAPE INCISE IOBAN 66X45 STRL (DRAPES) ×2 IMPLANT
DRAPE U-SHAPE 47X51 STRL (DRAPES) ×2 IMPLANT
DRSG AQUACEL AG ADV 3.5X10 (GAUZE/BANDAGES/DRESSINGS) ×2 IMPLANT
DRSG PAD ABDOMINAL 8X10 ST (GAUZE/BANDAGES/DRESSINGS) ×4 IMPLANT
ELECT REM PT RETURN 9FT ADLT (ELECTROSURGICAL) ×2
ELECTRODE REM PT RTRN 9FT ADLT (ELECTROSURGICAL) ×1 IMPLANT
GAUZE XEROFORM 1X8 LF (GAUZE/BANDAGES/DRESSINGS) ×2 IMPLANT
GLOVE BIO SURGEON STRL SZ8 (GLOVE) ×2 IMPLANT
GLOVE BIOGEL PI IND STRL 8 (GLOVE) ×2 IMPLANT
GLOVE BIOGEL PI INDICATOR 8 (GLOVE) ×2
GLOVE ORTHO TXT STRL SZ7.5 (GLOVE) ×2 IMPLANT
GOWN PREVENTION PLUS LG XLONG (DISPOSABLE) IMPLANT
GOWN STRL REUS W/ TWL LRG LVL3 (GOWN DISPOSABLE) ×1 IMPLANT
GOWN STRL REUS W/ TWL XL LVL3 (GOWN DISPOSABLE) ×4 IMPLANT
GOWN STRL REUS W/TWL LRG LVL3 (GOWN DISPOSABLE) ×1
GOWN STRL REUS W/TWL XL LVL3 (GOWN DISPOSABLE) ×4
KIT BASIN OR (CUSTOM PROCEDURE TRAY) ×2 IMPLANT
KIT ROOM TURNOVER OR (KITS) ×2 IMPLANT
MANIFOLD NEPTUNE II (INSTRUMENTS) ×2 IMPLANT
NEEDLE 22X1 1/2 (OR ONLY) (NEEDLE) ×2 IMPLANT
NS IRRIG 1000ML POUR BTL (IV SOLUTION) ×2 IMPLANT
PACK SHOULDER (CUSTOM PROCEDURE TRAY) ×2 IMPLANT
PAD ARMBOARD 7.5X6 YLW CONV (MISCELLANEOUS) ×4 IMPLANT
PLATE BONE 4H HUMERUS PROX LF (Plate) ×2 IMPLANT
SCREW 4.3X22MM (Screw) ×2 IMPLANT
SCREW BONE 4.3X22MM HEXA (Screw) ×1 IMPLANT
SCREW BONE 4.3X28MM TITA (Screw) ×4 IMPLANT
SCREW BONE 4.3X32MM HEXA (Screw) ×2 IMPLANT
SCREW BONE 4.3X34MM HEXA (Screw) ×2 IMPLANT
SCREW BONE 4.3X38MM HEXA (Screw) ×2 IMPLANT
SCREW BONE 4.3X40MM HEXA (Screw) ×4 IMPLANT
SCREW BONE 4.3X44MM HEXA (Screw) ×4 IMPLANT
SCREW BONE 4.3X46MM HEXA (Screw) ×2 IMPLANT
SPONGE GAUZE 4X4 12PLY (GAUZE/BANDAGES/DRESSINGS) ×2 IMPLANT
SPONGE LAP 4X18 X RAY DECT (DISPOSABLE) ×4 IMPLANT
STAPLER VISISTAT 35W (STAPLE) ×2 IMPLANT
STRIP CLOSURE SKIN 1/2X4 (GAUZE/BANDAGES/DRESSINGS) ×2 IMPLANT
SUCTION FRAZIER TIP 10 FR DISP (SUCTIONS) ×2 IMPLANT
SUT VIC AB 0 CT1 27 (SUTURE) ×2
SUT VIC AB 0 CT1 27XBRD ANBCTR (SUTURE) ×2 IMPLANT
SUT VIC AB 2-0 CT1 27 (SUTURE) ×2
SUT VIC AB 2-0 CT1 TAPERPNT 27 (SUTURE) ×2 IMPLANT
SYR CONTROL 10ML LL (SYRINGE) ×2 IMPLANT
TOWEL OR 17X24 6PK STRL BLUE (TOWEL DISPOSABLE) ×2 IMPLANT
TOWEL OR 17X26 10 PK STRL BLUE (TOWEL DISPOSABLE) ×2 IMPLANT
WATER STERILE IRR 1000ML POUR (IV SOLUTION) IMPLANT
YANKAUER SUCT BULB TIP NO VENT (SUCTIONS) ×2 IMPLANT

## 2013-04-01 NOTE — Anesthesia Postprocedure Evaluation (Signed)
  Anesthesia Post-op Note  Patient: Tiffany Horn  Procedure(s) Performed: Procedure(s): OPEN REDUCTION INTERNAL FIXATION (ORIF) LEFT PROXIMAL HUMERUS FRACTURE (Left)  Patient Location: PACU  Anesthesia Type:GA combined with regional for post-op pain  Level of Consciousness: awake, alert , oriented and patient cooperative  Airway and Oxygen Therapy: Patient Spontanous Breathing and Patient connected to nasal cannula oxygen  Post-op Pain: none  Post-op Assessment: Post-op Vital signs reviewed, Patient's Cardiovascular Status Stable, Respiratory Function Stable, Patent Airway, No signs of Nausea or vomiting and Pain level controlled  Post-op Vital Signs: Reviewed and stable  Complications: No apparent anesthesia complications

## 2013-04-01 NOTE — H&P (Signed)
Tiffany Horn is an 53 y.o. female.   Chief Complaint:   Left shoulder pain; known displaced proximal humerus fracture HPI:   53 yo female who injured her left shoulder after a mechanical fall in the ice.  Was seen at the ED and found to have a fractured left proxiial humerus.  She was placed appropriately in a sling.  She now presents for definitive treatment given the displaced nature of this fracture.  She understands the risks of nerve and vessel injury, infection, fracture non-union, and AVN.  Past Medical History  Diagnosis Date  . Migraines   . Depression   . Anxiety   . Hypertension     not taking any meds at present  . Pneumonia     11/14 and 03/14/13. Finishing antibiotic now  . Arthritis     knees and spine  . Hx of irritable bowel syndrome     years ago  . Complication of anesthesia     waking up is not easy    Past Surgical History  Procedure Laterality Date  . Tonsillectomy and adenoidectomy    . Laparoscopic abdominal exploration  1994    endometriosis  . Dilation and curettage of uterus    . Breast surgery      implants, then had them removed    Family History  Problem Relation Age of Onset  . Breast cancer Mother   . Diabetes Mother   . Cancer Father     colon  . Diabetes Father    Social History:  reports that she has never smoked. She has never used smokeless tobacco. She reports that she drinks about 1.2 ounces of alcohol per week. She reports that she does not use illicit drugs.  Allergies: No Known Allergies  No prescriptions prior to admission    No results found for this or any previous visit (from the past 48 hour(s)). Dg Chest 2 View  03/31/2013   CLINICAL DATA:  Preop exam.  History of pneumonia  EXAM: CHEST  2 VIEW  COMPARISON:  To 13 2015  FINDINGS: Right upper lobe infiltrate has resolved. Lungs are clear. Negative for heart failure pneumonia or effusion  IMPRESSION: No active cardiopulmonary disease.   Electronically Signed   By: Franchot Gallo M.D.   On: 03/31/2013 14:29    Review of Systems  All other systems reviewed and are negative.    Last menstrual period 01/31/2007. Physical Exam  Constitutional: She is oriented to person, place, and time. She appears well-developed and well-nourished.  HENT:  Head: Normocephalic and atraumatic.  Eyes: EOM are normal. Pupils are equal, round, and reactive to light.  Neck: Normal range of motion. Neck supple.  Cardiovascular: Normal rate and regular rhythm.   Respiratory: Effort normal and breath sounds normal.  GI: Soft. Bowel sounds are normal.  Musculoskeletal:       Left shoulder: She exhibits decreased range of motion, tenderness, bony tenderness, swelling and deformity.  Neurological: She is alert and oriented to person, place, and time.  Skin: Skin is warm and dry.  Psychiatric: She has a normal mood and affect.     Assessment/Plan Left proximal humerus fracture with comminution and displacement. 1)  To the OR today for open reduction/internal fixation of her left proximal humerus fracture; then admission overnight for observation, pain meds, antibiotics.  BLACKMAN,CHRISTOPHER Y 04/01/2013, 11:54 AM

## 2013-04-01 NOTE — Brief Op Note (Signed)
04/01/2013  4:59 PM  PATIENT:  Tiffany Horn  53 y.o. female  PRE-OPERATIVE DIAGNOSIS:  Left proximal humerus fracture  POST-OPERATIVE DIAGNOSIS:  Left proximal humerus fracture  PROCEDURE:  Procedure(s): OPEN REDUCTION INTERNAL FIXATION (ORIF) LEFT PROXIMAL HUMERUS FRACTURE (Left)  SURGEON:  Surgeon(s) and Role:    * Mcarthur Rossetti, MD - Primary  PHYSICIAN ASSISTANT: Benita Stabile, PA-C  ANESTHESIA:   regional and general  EBL:  Total I/O In: 1300 [I.V.:1300] Out: 50 [Blood:50]  BLOOD ADMINISTERED:none  DRAINS: none   LOCAL MEDICATIONS USED:  NONE  SPECIMEN:  No Specimen  DISPOSITION OF SPECIMEN:  N/A  COUNTS:  YES  TOURNIQUET:  * No tourniquets in log *  DICTATION: .Other Dictation: Dictation Number [id number]  PLAN OF CARE: Admit for overnight observation  PATIENT DISPOSITION:  PACU - hemodynamically stable.   Delay start of Pharmacological VTE agent (>24hrs) due to surgical blood loss or risk of bleeding: no

## 2013-04-01 NOTE — Anesthesia Procedure Notes (Signed)
Anesthesia Regional Block:  Interscalene brachial plexus block  Pre-Anesthetic Checklist: ,, timeout performed, Correct Patient, Correct Site, Correct Laterality, Correct Procedure, Correct Position, site marked, Risks and benefits discussed,  Surgical consent,  Pre-op evaluation,  At surgeon's request and post-op pain management  Laterality: Left  Prep: chloraprep       Needles:  Injection technique: Single-shot  Needle Type: Echogenic Stimulator Needle     Needle Length: 5cm 5 cm Needle Gauge: 22 and 22 G    Additional Needles:  Procedures: ultrasound guided (picture in chart) and nerve stimulator Interscalene brachial plexus block  Nerve Stimulator or Paresthesia:  Response: biceps flexion, 0.45 mA,   Additional Responses:   Narrative:  Start time: 04/01/2013 2:04 PM End time: 04/01/2013 2:16 PM Injection made incrementally with aspirations every 5 mL.  Performed by: Personally  Anesthesiologist: Dr Marcie Bal  Additional Notes: Functioning IV was confirmed and monitors were applied.  A 28mm 22ga Arrow echogenic stimulator needle was used. Sterile prep and drape,hand hygiene and sterile gloves were used.  Negative aspiration and negative test dose prior to incremental administration of local anesthetic. The patient tolerated the procedure well.  Ultrasound guidance: relevent anatomy identified, needle position confirmed, local anesthetic spread visualized around nerve(s), vascular puncture avoided.  Image printed for medical record.

## 2013-04-01 NOTE — Anesthesia Preprocedure Evaluation (Signed)
Anesthesia Evaluation  Patient identified by MRN, date of birth, ID band Patient awake    Reviewed: Allergy & Precautions, H&P , NPO status , Patient's Chart, lab work & pertinent test results, reviewed documented beta blocker date and time   History of Anesthesia Complications (+) history of anesthetic complications  Airway Mallampati: II TM Distance: >3 FB Neck ROM: full    Dental   Pulmonary pneumonia -,  breath sounds clear to auscultation        Cardiovascular hypertension, On Medications and On Home Beta Blockers Rhythm:regular     Neuro/Psych  Headaches, PSYCHIATRIC DISORDERS    GI/Hepatic negative GI ROS, Neg liver ROS,   Endo/Other  negative endocrine ROS  Renal/GU negative Renal ROS  negative genitourinary   Musculoskeletal   Abdominal   Peds  Hematology negative hematology ROS (+)   Anesthesia Other Findings See surgeon's H&P   Reproductive/Obstetrics negative OB ROS                           Anesthesia Physical Anesthesia Plan  ASA: III  Anesthesia Plan: General   Post-op Pain Management:    Induction: Intravenous  Airway Management Planned: Oral ETT  Additional Equipment:   Intra-op Plan:   Post-operative Plan: Extubation in OR  Informed Consent: I have reviewed the patients History and Physical, chart, labs and discussed the procedure including the risks, benefits and alternatives for the proposed anesthesia with the patient or authorized representative who has indicated his/her understanding and acceptance.   Dental Advisory Given  Plan Discussed with: CRNA and Surgeon  Anesthesia Plan Comments:         Anesthesia Quick Evaluation

## 2013-04-01 NOTE — Discharge Instructions (Signed)
Ice as needed for left shoulder swelling. Stay in your sling for the most part, but do come out of it daily to move your elbow and wrist. No reaching behind you or overhead with your left arm/shoulder until further notice. Leave your current dressing in place for the next 7 days. You can get your current dressing wet in the shower when you are comfortable. You can remove your current dressing in 7 days and then get your actual incision wet (try to leave the steri-strips in place)

## 2013-04-01 NOTE — Transfer of Care (Signed)
Immediate Anesthesia Transfer of Care Note  Patient: Tiffany Horn  Procedure(s) Performed: Procedure(s): OPEN REDUCTION INTERNAL FIXATION (ORIF) LEFT PROXIMAL HUMERUS FRACTURE (Left)  Patient Location: PACU  Anesthesia Type:General  Level of Consciousness: awake, oriented, patient cooperative and responds to stimulation  Airway & Oxygen Therapy: Patient Spontanous Breathing and Patient connected to nasal cannula oxygen  Post-op Assessment: Report given to PACU RN, Post -op Vital signs reviewed and stable and Patient moving all extremities X 4  Post vital signs: Reviewed and stable  Complications: No apparent anesthesia complications

## 2013-04-01 NOTE — Preoperative (Signed)
Beta Blockers   Reason not to administer Beta Blockers:Not Applicable 

## 2013-04-02 ENCOUNTER — Encounter (HOSPITAL_COMMUNITY): Payer: Self-pay | Admitting: General Practice

## 2013-04-02 MED ORDER — KETOROLAC TROMETHAMINE 15 MG/ML IJ SOLN
15.0000 mg | Freq: Once | INTRAMUSCULAR | Status: AC
  Administered 2013-04-02: 15 mg via INTRAVENOUS
  Filled 2013-04-02: qty 1

## 2013-04-02 MED ORDER — KETOROLAC TROMETHAMINE 30 MG/ML IJ SOLN
30.0000 mg | Freq: Once | INTRAMUSCULAR | Status: AC
  Administered 2013-04-02: 30 mg via INTRAVENOUS
  Filled 2013-04-02: qty 1

## 2013-04-02 MED ORDER — INFLUENZA VAC SPLIT QUAD 0.5 ML IM SUSP
0.5000 mL | Freq: Once | INTRAMUSCULAR | Status: DC
  Filled 2013-04-02: qty 0.5

## 2013-04-02 NOTE — Progress Notes (Signed)
UR completed 

## 2013-04-02 NOTE — Progress Notes (Signed)
Subjective: 1 Day Post-Op Procedure(s) (LRB): OPEN REDUCTION INTERNAL FIXATION (ORIF) LEFT PROXIMAL HUMERUS FRACTURE (Left) Patient reports pain as severe.  Patient states her shoulder pain has been out of control all night.  Objective: Vital signs in last 24 hours: Temp:  [97.6 F (36.4 C)-98.5 F (36.9 C)] 97.8 F (36.6 C) (03/04 0603) Pulse Rate:  [68-90] 74 (03/04 0603) Resp:  [9-28] 18 (03/04 0603) BP: (116-155)/(71-98) 125/80 mmHg (03/04 0603) SpO2:  [92 %-100 %] 92 % (03/04 0603) Weight:  [72.576 kg (160 lb)] 72.576 kg (160 lb) (03/03 1238)  Intake/Output from previous day: 03/03 0701 - 03/04 0700 In: 1300 [I.V.:1300] Out: 50 [Blood:50] Intake/Output this shift:     Recent Labs  04/01/13 1244  HGB 12.0    Recent Labs  04/01/13 1244  WBC 6.0  RBC 4.12  HCT 35.4*  PLT 273    Recent Labs  04/01/13 1244  NA 145  K 3.3*  CL 106  CO2 26  BUN 9  CREATININE 0.69  GLUCOSE 90  CALCIUM 8.9   No results found for this basename: LABPT, INR,  in the last 72 hours  Left shoulder: Sensation intact distally Intact pulses distally Incision: dressing C/D/I  Assessment/Plan: 1 Day Post-Op Procedure(s) (LRB): OPEN REDUCTION INTERNAL FIXATION (ORIF) LEFT PROXIMAL HUMERUS FRACTURE (Left) Will hold discharge this AM due to poor pain control. Adjust pain meds. Probable discharge tomorrow   Erskine Emery 04/02/2013, 8:13 AM

## 2013-04-02 NOTE — Op Note (Signed)
NAMECHAROLETTE, BULTMAN NO.:  [id number]  MEDICAL RECORD NO.:  [id number]  LOCATION:  5N09C                        FACILITY:  [facility]  PHYSICIAN:  Lind Guest. Ninfa Linden, M.D.DATE OF BIRTH:  [birth year]  DATE OF PROCEDURE:  04/01/2013 DATE OF DISCHARGE:                              OPERATIVE REPORT   PREOPERATIVE DIAGNOSIS:  Left shoulder 3-4 part comminuted proximal humerus fracture.  POSTOPERATIVE DIAGNOSIS:  Left shoulder 4-part proximal humerus fracture.  PROCEDURE:  Open reduction and internal fixation of left shoulder proximal humerus fracture.  IMPLANTS:  Acumed proximal humerus locking plate.  SURGEON:  Lind Guest. Ninfa Linden, M.D.  ANESTHESIA: 1. Regional. 2. General.  BLOOD LOSS:  Less than 200 mL.  ANTIBIOTICS:  2 g of IV Ancef.  COMPLICATIONS:  None.  INDICATIONS:  [name] is a very pleasant 53 year old female who less than 2 weeks ago, slipped on the ice sustaining a mechanical fall. She injured her left shoulder and was found to have a comminuted proximal humerus fracture.  It was at least 3-4 part fracture.  She is only 53 years old and given the comminuted nature of this fracture and displacement, we recommended she undergo open reduction and internal fixation.  The risks and benefits of the surgery were explained to her in detail, and she did wish to proceed.  DESCRIPTION OF PROCEDURE:  After informed consent was obtained, appropriate left shoulder was marked.  Anesthesia obtained with regional shoulder block.  She was then brought to the operating room, placed supine on the operating table.  General anesthesia was then obtained. She was then fashioned in a beach-chair position with appropriate positioning of her head and neck, bending at the waist and knees, and padding of the down on operative right arm.  Her left shoulder was then prepped and draped with DuraPrep and sterile drapes including sterile stockinette.  Time-out  was called and she was identified as correct patient, correct left shoulder.  I then performed a deltopectoral approach to the proximal humerus and carried this down to the fracture itself, which was found highly comminuted.  We were able to place FiberWire sutures in the front of the rotator cuff as well as sutures posteriorly to mobilize the rotator cuff and 2 large fracture fragments. We then reduced this to the shaft and placed an Acumed proximal humeral locking plate.  We were able to temporary secure this with K-wires and then place bicortical screws distally and locking screws proximally, which spanned out in the humeral head to hold this into position.  We then used a FiberWire sutures that were tied through the rotator cuff and incorporated these and tying them down to the plate.  I put the shoulder through internal and external rotation under direct fluoroscopy and it was well located and the fracture seemed reasonably reduced with no signs of impingement from any of the hardware.  We then irrigated the soft tissue with normal saline solution and closed the deep tissue over the plate with 0-Vicryl suture followed by 2-0 Vicryl in subcutaneous tissue, 4-0 Monocryl subcuticular stitch.  Steri-Strips on the skin and Aquacel dressing.  Her shoulder was placed into a sling.  She was taken laid back  in the supine position, awakened, extubated, and taken to the recovery room in stable condition.  All final counts were correct. There were no complications noted.  Of note, Carney Bern, PA-C assisted the entire case and his assistance was crucial holding the arm, helping retracting and positioning to secure the plate and screws.  He also performed a layered closure of the wound.     Lind Guest. Ninfa Linden, M.D.     CYB/MEDQ  D:  04/01/2013  T:  04/02/2013  Job:  [id number]

## 2013-04-03 MED ORDER — METHOCARBAMOL 500 MG PO TABS: 500.0000 mg | ORAL_TABLET | Freq: Four times a day (QID) | ORAL | Status: DC | PRN

## 2013-04-03 NOTE — Discharge Summary (Signed)
Patient ID: Tiffany Horn MRN: [id number] DOB/AGE: 1961-01-27 53 y.o.  Admit date: 04/01/2013 Discharge date: 04/03/2013  Admission Diagnoses:  Principal Problem:   Fracture of humerus, proximal, left, closed Active Problems:   Closed fracture of left proximal humerus   Discharge Diagnoses:  Same  Past Medical History  Diagnosis Date  . Migraines   . Depression   . Anxiety   . Hypertension     not taking any meds at present  . Pneumonia     11/14 and 03/14/13. Finishing antibiotic now  . Arthritis     knees and spine  . Hx of irritable bowel syndrome     years ago  . Complication of anesthesia     waking up is not easy  . PONV (postoperative nausea and vomiting)     Surgeries: Procedure(s): OPEN REDUCTION INTERNAL FIXATION (ORIF) LEFT PROXIMAL HUMERUS FRACTURE on 04/01/2013   Consultants:    Discharged Condition: Improved  Hospital Course: Tiffany Horn is an 53 y.o. female who was admitted 04/01/2013 for operative treatment ofFracture of humerus, proximal, left, closed. Patient has severe unremitting pain that affects sleep, daily activities, and work/hobbies. After pre-op clearance the patient was taken to the operating room on 04/01/2013 and underwent  Procedure(s): OPEN REDUCTION INTERNAL FIXATION (ORIF) LEFT PROXIMAL HUMERUS FRACTURE.    Patient was given perioperative antibiotics: Anti-infectives   Start     Dose/Rate Route Frequency Ordered Stop   04/01/13 2100  ceFAZolin (ANCEF) IVPB 1 g/50 mL premix     1 g 100 mL/hr over 30 Minutes Intravenous Every 6 hours 04/01/13 2000 04/02/13 1011   04/01/13 0600  ceFAZolin (ANCEF) IVPB 2 g/50 mL premix     2 g 100 mL/hr over 30 Minutes Intravenous On call to O.R. 03/31/13 1808 04/01/13 1452       Patient was given sequential compression devices, early ambulation, and chemoprophylaxis to prevent DVT.  Patient benefited maximally from hospital stay and there were no complications.    Recent vital signs: Patient Vitals  for the past 24 hrs:  BP Temp Temp src Pulse Resp SpO2  04/02/13 2035 129/75 mmHg 100.2 F (37.9 C) Oral 87 18 96 %  04/02/13 1405 126/73 mmHg 98 F (36.7 C) Oral 80 18 96 %     Recent laboratory studies:  Recent Labs  04/01/13 1244  WBC 6.0  HGB 12.0  HCT 35.4*  PLT 273  NA 145  K 3.3*  CL 106  CO2 26  BUN 9  CREATININE 0.69  GLUCOSE 90  CALCIUM 8.9     Discharge Medications:     Medication List         buPROPion 150 MG 24 hr tablet  Commonly known as:  WELLBUTRIN XL  Take 150 mg by mouth 3 (three) times daily.     cefdinir 300 MG capsule  Commonly known as:  OMNICEF  Take 1 capsule (300 mg total) by mouth 2 (two) times daily.     ibuprofen 800 MG tablet  Commonly known as:  ADVIL,MOTRIN  Take 800 mg by mouth every 8 (eight) hours as needed for moderate pain.     methocarbamol 500 MG tablet  Commonly known as:  ROBAXIN  Take 1 tablet (500 mg total) by mouth every 6 (six) hours as needed for muscle spasms.     mirtazapine 30 MG tablet  Commonly known as:  REMERON  Take 30 mg by mouth at bedtime.     oxyCODONE-acetaminophen 5-325 MG per tablet  Commonly known as:  PERCOCET/ROXICET  Take 1-2 tablets by mouth every 4 (four) hours as needed for severe pain.     sertraline 100 MG tablet  Commonly known as:  ZOLOFT  Take 200 mg by mouth daily.        Diagnostic Studies: Dg Chest 2 View  03/31/2013   CLINICAL DATA:  Preop exam.  History of pneumonia  EXAM: CHEST  2 VIEW  COMPARISON:  To 13 2015  FINDINGS: Right upper lobe infiltrate has resolved. Lungs are clear. Negative for heart failure pneumonia or effusion  IMPRESSION: No active cardiopulmonary disease.   Electronically Signed   By: Franchot Gallo M.D.   On: 03/31/2013 14:29   Dg Chest 2 View  03/14/2013   CLINICAL DATA:  Cough  EXAM: CHEST  2 VIEW  COMPARISON:  December 31, 2012  FINDINGS: The left lung is now clear. There is, however, persistent infiltrate in the posterior segment of the right upper  lobe. Elsewhere, the right lung appears clear. Heart size and pulmonary vascularity are normal. No adenopathy. No bone lesions.  IMPRESSION: Persistent right upper lobe infiltrate consistent with pneumonia. Elsewhere, lungs are now clear. No adenopathy appreciable.   Electronically Signed   By: Lowella Grip M.D.   On: 03/14/2013 11:21   Dg Chest 2 View  03/14/2013   This is the back office imaging final text. See progress note  for physician's narrative and interpretation.   Dg Shoulder Left  03/21/2013   CLINICAL DATA:  Fall, left shoulder pain  EXAM: LEFT SHOULDER - 2+ VIEW  COMPARISON:  03/14/2013 chest x-ray  FINDINGS: there is an acute displaced left proximal humerus surgical neck fracture. Fracture lines also noted throughout the humeral head. Limited exam because of positioning related to the fracture. No gross subluxation or dislocation. AC joint aligned. Visualized left lung clear.  IMPRESSION: Acute displaced left proximal humerus surgical neck and head complex fracture.   Electronically Signed   By: Daryll Brod M.D.   On: 03/21/2013 19:52   Dg Shoulder Left Port  04/01/2013   CLINICAL DATA:  Right humerus fracture fixation.  EXAM: PORTABLE LEFT SHOULDER - 2+ VIEW  COMPARISON:  Intraoperative films, same date.  FINDINGS: Side plate and multiple screws are transfixing knee comminuted humeral head and neck fractures.  IMPRESSION: Internal fixation of humeral head and neck fractures.   Electronically Signed   By: Kalman Jewels M.D.   On: 04/01/2013 18:34   Dg Humerus Left  04/01/2013   CLINICAL DATA:  Internal fixation  EXAM: LEFT HUMERUS - 2+ VIEW; DG C-ARM 61-120 MIN  COMPARISON:  03/21/2013  FINDINGS: Three intraoperative fluoroscopic spot images document plate and screw fixation of the left humerus surgical neck and head fracture, with mild residual impaction.  IMPRESSION: Internal fixation, left proximal humeral fracture.   Electronically Signed   By: Arne Cleveland M.D.   On:  04/01/2013 16:57   Dg C-arm 61-120 Min  04/01/2013   CLINICAL DATA:  Internal fixation  EXAM: LEFT HUMERUS - 2+ VIEW; DG C-ARM 61-120 MIN  COMPARISON:  03/21/2013  FINDINGS: Three intraoperative fluoroscopic spot images document plate and screw fixation of the left humerus surgical neck and head fracture, with mild residual impaction.  IMPRESSION: Internal fixation, left proximal humeral fracture.   Electronically Signed   By: Arne Cleveland M.D.   On: 04/01/2013 16:57    Disposition: 01-Home or Self Care      Discharge Orders   Future Orders Complete By  Expires   Call MD / Call 911  As directed    Comments:     If you experience chest pain or shortness of breath, CALL 911 and be transported to the hospital emergency room.  If you develope a fever above 101 F, pus (white drainage) or increased drainage or redness at the wound, or calf pain, call your surgeon's office.   Call MD / Call 911  As directed    Comments:     If you experience chest pain or shortness of breath, CALL 911 and be transported to the hospital emergency room.  If you develope a fever above 101 F, pus (white drainage) or increased drainage or redness at the wound, or calf pain, call your surgeon's office.   Constipation Prevention  As directed    Comments:     Drink plenty of fluids.  Prune juice may be helpful.  You may use a stool softener, such as Colace (over the counter) 100 mg twice a day.  Use MiraLax (over the counter) for constipation as needed.   Constipation Prevention  As directed    Comments:     Drink plenty of fluids.  Prune juice may be helpful.  You may use a stool softener, such as Colace (over the counter) 100 mg twice a day.  Use MiraLax (over the counter) for constipation as needed.   Diet - low sodium heart healthy  As directed    Diet - low sodium heart healthy  As directed    Discharge patient  As directed    Increase activity slowly as tolerated  As directed    Increase activity slowly as  tolerated  As directed       Follow-up Information   Follow up with Mcarthur Rossetti, MD. Schedule an appointment as soon as possible for a visit in 2 weeks.   Specialty:  Orthopedic Surgery   Contact information:   [place] [address] [phone]        Signed: Mcarthur Rossetti 04/03/2013, 7:05 AM

## 2013-04-03 NOTE — Progress Notes (Signed)
Pt discharged to home. D/c instructions given. No questions verbalized. Vitals stable.

## 2013-04-03 NOTE — Progress Notes (Signed)
Patient ID: Tiffany Horn, female   DOB: 08-11-1960, 53 y.o.   MRN: [id number] Much more comfortable this am.  Dressing with scant drainage.  Hand well-perfused.  Can discharge to home today.

## 2013-04-04 ENCOUNTER — Encounter (HOSPITAL_COMMUNITY): Payer: Self-pay | Admitting: Orthopaedic Surgery

## 2013-04-16 ENCOUNTER — Encounter: Payer: Self-pay | Admitting: Family Medicine

## 2013-07-02 ENCOUNTER — Encounter: Payer: Self-pay | Admitting: Family Medicine

## 2013-11-11 ENCOUNTER — Encounter: Payer: Self-pay | Admitting: Certified Nurse Midwife

## 2013-11-11 ENCOUNTER — Ambulatory Visit (INDEPENDENT_AMBULATORY_CARE_PROVIDER_SITE_OTHER): Payer: BC Managed Care – PPO | Admitting: Certified Nurse Midwife

## 2013-11-11 VITALS — BP 120/80 | HR 74 | Resp 16 | Ht 65.25 in | Wt 187.0 lb

## 2013-11-11 DIAGNOSIS — R6889 Other general symptoms and signs: Secondary | ICD-10-CM

## 2013-11-11 DIAGNOSIS — Z1211 Encounter for screening for malignant neoplasm of colon: Secondary | ICD-10-CM

## 2013-11-11 DIAGNOSIS — Z01419 Encounter for gynecological examination (general) (routine) without abnormal findings: Secondary | ICD-10-CM

## 2013-11-11 DIAGNOSIS — Z124 Encounter for screening for malignant neoplasm of cervix: Secondary | ICD-10-CM

## 2013-11-11 DIAGNOSIS — Z0000 Encounter for general adult medical examination without abnormal findings: Secondary | ICD-10-CM

## 2013-11-11 LAB — POCT URINALYSIS DIPSTICK
Bilirubin, UA: NEGATIVE
Blood, UA: NEGATIVE
Glucose, UA: NEGATIVE
Ketones, UA: NEGATIVE
Leukocytes, UA: NEGATIVE
Nitrite, UA: NEGATIVE
Protein, UA: NEGATIVE
Urobilinogen, UA: NEGATIVE
pH, UA: 5

## 2013-11-11 LAB — COMPREHENSIVE METABOLIC PANEL
ALT: 21 U/L (ref 0–35)
AST: 19 U/L (ref 0–37)
Albumin: 4.2 g/dL (ref 3.5–5.2)
Alkaline Phosphatase: 77 U/L (ref 39–117)
BUN: 14 mg/dL (ref 6–23)
CO2: 28 mEq/L (ref 19–32)
Calcium: 9.5 mg/dL (ref 8.4–10.5)
Chloride: 102 mEq/L (ref 96–112)
Creat: 0.83 mg/dL (ref 0.50–1.10)
Glucose, Bld: 93 mg/dL (ref 70–99)
Potassium: 4.2 mEq/L (ref 3.5–5.3)
Sodium: 139 mEq/L (ref 135–145)
Total Bilirubin: 0.3 mg/dL (ref 0.2–1.2)
Total Protein: 6.9 g/dL (ref 6.0–8.3)

## 2013-11-11 LAB — LIPID PANEL
Cholesterol: 237 mg/dL — ABNORMAL HIGH (ref 0–200)
HDL: 53 mg/dL (ref >39)
LDL Cholesterol: 124 mg/dL — ABNORMAL HIGH (ref 0–99)
Total CHOL/HDL Ratio: 4.5 Ratio
Triglycerides: 298 mg/dL — ABNORMAL HIGH (ref <150)
VLDL: 60 mg/dL — ABNORMAL HIGH (ref 0–40)

## 2013-11-11 LAB — CBC
HCT: 40.6 % (ref 36.0–46.0)
Hemoglobin: 13.9 g/dL (ref 12.0–15.0)
MCH: 29.4 pg (ref 26.0–34.0)
MCHC: 34.2 g/dL (ref 30.0–36.0)
MCV: 86 fL (ref 78.0–100.0)
Platelets: 254 10*3/uL (ref 150–400)
RBC: 4.72 MIL/uL (ref 3.87–5.11)
RDW: 13.7 % (ref 11.5–15.5)
WBC: 6.2 10*3/uL (ref 4.0–10.5)

## 2013-11-11 NOTE — Patient Instructions (Signed)

## 2013-11-11 NOTE — Progress Notes (Signed)
53 y.o. G45P0020 Divorced Caucasian Fe here for annual exam. Menopausal, no HRT. Denies vaginal bleeding or vaginal dryness. Patient had fractured left arm on ice this year, still in therapy. See PCP prn. No other health issues today. Desires screening labs today.  Patient's last menstrual period was 01/31/2007.          Sexually active: No.  The current method of family planning is post menopausal status.    Exercising: No.  exercise Smoker:  no  Health Maintenance: Pap: 10-23-12 neg no endos, HPV HR neg MMG: 10-21-13 composition category a, birads category 2,neg Colonoscopy:  2007 negative 10 year plan BMD:   2007 TDaP:  6/12 Labs: none Self breast exam: not done   reports that she has never smoked. She has never used smokeless tobacco. She reports that she drinks about 1.2 - 1.8 ounces of alcohol per week. She reports that she does not use illicit drugs.  Past Medical History  Diagnosis Date  . Migraines   . Depression   . Anxiety   . Hypertension     not taking any meds at present  . Pneumonia     11/14 and 03/14/13. Finishing antibiotic now  . Arthritis     knees and spine  . Hx of irritable bowel syndrome     years ago  . Complication of anesthesia     waking up is not easy  . PONV (postoperative nausea and vomiting)     Past Surgical History  Procedure Laterality Date  . Tonsillectomy and adenoidectomy    . Laparoscopic abdominal exploration  1994    endometriosis  . Dilation and curettage of uterus    . Breast surgery      implants, then had them removed  . Orif humerus fracture Left 04/01/2013    DR Ninfa Linden  . Orif humerus fracture Left 04/01/2013    Procedure: OPEN REDUCTION INTERNAL FIXATION (ORIF) LEFT PROXIMAL HUMERUS FRACTURE;  Surgeon: Mcarthur Rossetti, MD;  Location: [facility];  Service: Orthopedics;  Laterality: Left;    Current Outpatient Prescriptions  Medication Sig Dispense Refill  . buPROPion (WELLBUTRIN XL) 300 MG 24 hr tablet Take 300 mg by  mouth daily.      Marland Kitchen ibuprofen (ADVIL,MOTRIN) 800 MG tablet Take 800 mg by mouth every 8 (eight) hours as needed for moderate pain.      . mirtazapine (REMERON) 30 MG tablet Take 30 mg by mouth at bedtime.      . sertraline (ZOLOFT) 100 MG tablet Take 200 mg by mouth daily.      . [DISCONTINUED] Asenapine Maleate (SAPHRIS SL) Place under the tongue. 2 at bedtime       No current facility-administered medications for this visit.    Family History  Problem Relation Age of Onset  . Breast cancer Mother   . Diabetes Mother   . Cancer Father     colon  . Diabetes Father     ROS:  Pertinent items are noted in HPI.  Otherwise, a comprehensive ROS was negative.  Exam:   BP 120/80  Pulse 74  Resp 16  Ht 5' 5.25" (1.657 m)  Wt 187 lb (84.823 kg)  BMI 30.89 kg/m2  LMP 01/31/2007 Height: 5' 5.25" (165.7 cm)  Ht Readings from Last 3 Encounters:  11/11/13 5' 5.25" (1.657 m)  04/01/13 5\' 6"  (1.676 m)  04/01/13 5\' 6"  (1.676 m)    General appearance: alert, cooperative and appears stated age Head: Normocephalic, without obvious abnormality, atraumatic  Neck: no adenopathy, supple, symmetrical, trachea midline and thyroid normal to inspection and palpation Lungs: clear to auscultation bilaterally Breasts: normal appearance, no masses or tenderness, No nipple retraction or dimpling, No nipple discharge or bleeding, No axillary or supraclavicular adenopathy Heart: regular rate and rhythm Abdomen: soft, non-tender; no masses,  no organomegaly Extremities: extremities normal, atraumatic, no cyanosis or edema Skin: Skin color, texture, turgor normal. No rashes or lesions Lymph nodes: Cervical, supraclavicular, and axillary nodes normal. No abnormal inguinal nodes palpated Neurologic: Grossly normal   Pelvic: External genitalia:  no lesions              Urethra:  normal appearing urethra with no masses, tenderness or lesions              Bartholin's and Skene's: normal                  Vagina: normal appearing vagina with normal color and discharge, no lesions              Cervix: normal non tender, no lesions              Pap taken: Yes.   Bimanual Exam:  Uterus:  normal size, contour, position, consistency, mobility, non-tender and anteverted              Adnexa: normal adnexa and no mass, fullness, tenderness               Rectovaginal: Confirms               Anus:  normal sphincter tone, no lesions  A:  Well Woman with normal exam  Menopausal  No HRT  Fractured  Left arm under follow up  Screening labs  P:   Reviewed health and wellness pertinent to exam  Aware of need to evaluate if vaginal bleeding  Continue follow up as indicated  Labs: Lipid panel, CMP, CBC, TSH,Hgb A1-c, Vitamin D  Pap smear taken today with HPVHR  IFOB dispensed   counseled on adequate intake of calcium and vitamin D, diet and exercise  return annually or prn  An After Visit Summary was printed and given to the patient.

## 2013-11-12 LAB — HEMOGLOBIN A1C
Hgb A1c MFr Bld: 5.9 % — ABNORMAL HIGH (ref <5.7)
Mean Plasma Glucose: 123 mg/dL — ABNORMAL HIGH (ref <117)

## 2013-11-12 LAB — TSH: TSH: 6.952 u[IU]/mL — ABNORMAL HIGH (ref 0.350–4.500)

## 2013-11-12 LAB — IPS PAP TEST WITH REFLEX TO HPV

## 2013-11-12 LAB — VITAMIN D 25 HYDROXY (VIT D DEFICIENCY, FRACTURES): Vit D, 25-Hydroxy: 26 ng/mL — ABNORMAL LOW (ref 30–89)

## 2013-11-13 ENCOUNTER — Telehealth: Payer: Self-pay

## 2013-11-13 NOTE — Telephone Encounter (Signed)
lmtcb

## 2013-11-13 NOTE — Telephone Encounter (Signed)
Patient is given message from [name] CNM  Patient verbalized understanding of further instructions and plan of care from [place].  Patient reports she was not fasting at time of lab draw.  I advised that it is very important that future tests, in one and three months be fasting in order to obtain accurate results. Patient verbalized understanding.   Patient will call back for lab draw. She does not have calendar with her.   Joy, will you hold message until patient schedules lab draw in one month?

## 2013-11-13 NOTE — Telephone Encounter (Signed)
Notes Recorded by [name], CNM on 11/13/2013 at 3:54 AM One month Triglycerides, TSH with thyroid panel 3 months Lipid panel and Vit. D

## 2013-11-13 NOTE — Telephone Encounter (Signed)
Message copied by Susy Manor on Thu Nov 13, 2013 10:56 AM ------      Message from: [name]      Created: Wed Nov 12, 2013  8:07 AM       Notify patient that Cholesterol is borderline high at 237, normal less than 200  TIrglycerides are high 298 normal less than 150      HDL is good(protective cholesterol)      LDL is optimal level      VLDL is borderline high need to work on exercise what she is able to do with arm repair. Change diet to increase lean protein and complex carbohydrates, limiting fried or large amounts of sugar in diet this help the profile  Will need recheck of panel in 3 months order in      HGB A1-c test for screening of risk of diabetes was 5.9, normal 5.7 or less, this reflects increase risk of developing not diabetes, but a red flag to work on changes as above      Liver, Kidney and glucose level were normal      Vitamin D is low, Start on Vit. D 3 1000 IU daily OTC recheck in 3 months with other labs      CBC is normal      TSH is elevated which can be from stress or other changes, recheck in one month with Triglycerides in one month(fasting) order in       ------

## 2013-11-17 NOTE — Telephone Encounter (Signed)
Pt states she wants to go over her results again she didn't write them down

## 2013-11-17 NOTE — Telephone Encounter (Signed)
Pt states she just wanted her results for her hgb a1c because she is concerned about that. Pt was given results & recommendations. Pt states she has not been feeling well lately & having a little blurred vision. I told pt she needs to be seen at urgent care or to go see pcp to be evaluated. Pt told to go to ER if symptoms increase. Pt states she normally goes to urgent care & does not have a pcp anywhere else anymore. Pt aware that they have pcps at urgent care. Pt to call back & get numbers to pcps near her if needed. Pt agrees & understands recommendations. Pt needs to still set up lab appts & states she will have to call back to do this.

## 2013-11-17 NOTE — Telephone Encounter (Signed)
Patient is asking for recent results.

## 2013-11-19 NOTE — Telephone Encounter (Signed)
Please check with this patient in one week about setting up lab appointment

## 2013-11-19 NOTE — Progress Notes (Signed)
Reviewed personally.  M. Suzanne Adith Tejada, MD.  

## 2013-11-27 NOTE — Telephone Encounter (Signed)
I called pt to try & get her to scheduled her 20mth & 56mth lab appt. Pt states she can not schedule that right now & she will callback when she can. Pt aware she needs 44mth lab appt for around 12-14-13. Okay to close this encounter?

## 2013-12-01 ENCOUNTER — Encounter: Payer: Self-pay | Admitting: Certified Nurse Midwife

## 2013-12-01 NOTE — Telephone Encounter (Signed)
yes

## 2013-12-02 NOTE — Telephone Encounter (Signed)
Encounter closed

## 2014-05-20 ENCOUNTER — Other Ambulatory Visit (INDEPENDENT_AMBULATORY_CARE_PROVIDER_SITE_OTHER): Payer: BC Managed Care – PPO

## 2014-05-20 ENCOUNTER — Other Ambulatory Visit: Payer: Self-pay | Admitting: Certified Nurse Midwife

## 2014-05-20 DIAGNOSIS — IMO0002 Reserved for concepts with insufficient information to code with codable children: Secondary | ICD-10-CM

## 2014-05-20 DIAGNOSIS — R6889 Other general symptoms and signs: Secondary | ICD-10-CM

## 2014-05-20 LAB — LIPID PANEL
Cholesterol: 208 mg/dL — ABNORMAL HIGH (ref 0–200)
HDL: 48 mg/dL (ref >=46)
LDL Cholesterol: 129 mg/dL — ABNORMAL HIGH (ref 0–99)
Total CHOL/HDL Ratio: 4.3 Ratio
Triglycerides: 157 mg/dL — ABNORMAL HIGH (ref <150)
VLDL: 31 mg/dL (ref 0–40)

## 2014-05-20 LAB — THYROID PANEL WITH TSH
Free Thyroxine Index: 1.5 (ref 1.4–3.8)
T3 Uptake: 24 % (ref 22–35)
T4, Total: 6.1 ug/dL (ref 4.5–12.0)
TSH: 7.023 u[IU]/mL — ABNORMAL HIGH (ref 0.350–4.500)

## 2014-05-20 LAB — COMPREHENSIVE METABOLIC PANEL
ALT: 38 U/L — ABNORMAL HIGH (ref 0–35)
AST: 25 U/L (ref 0–37)
Albumin: 4.1 g/dL (ref 3.5–5.2)
Alkaline Phosphatase: 71 U/L (ref 39–117)
BUN: 15 mg/dL (ref 6–23)
CO2: 22 mEq/L (ref 19–32)
Calcium: 9.4 mg/dL (ref 8.4–10.5)
Chloride: 106 mEq/L (ref 96–112)
Creat: 0.69 mg/dL (ref 0.50–1.10)
Glucose, Bld: 91 mg/dL (ref 70–99)
Potassium: 4.1 mEq/L (ref 3.5–5.3)
Sodium: 140 mEq/L (ref 135–145)
Total Bilirubin: 0.4 mg/dL (ref 0.2–1.2)
Total Protein: 6.8 g/dL (ref 6.0–8.3)

## 2014-05-21 LAB — VITAMIN D 25 HYDROXY (VIT D DEFICIENCY, FRACTURES): Vit D, 25-Hydroxy: 15 ng/mL — ABNORMAL LOW (ref 30–100)

## 2014-05-22 ENCOUNTER — Telehealth: Payer: Self-pay | Admitting: Certified Nurse Midwife

## 2014-05-22 DIAGNOSIS — R740 Nonspecific elevation of levels of transaminase and lactic acid dehydrogenase [LDH]: Principal | ICD-10-CM

## 2014-05-22 DIAGNOSIS — R7989 Other specified abnormal findings of blood chemistry: Secondary | ICD-10-CM

## 2014-05-22 DIAGNOSIS — R7401 Elevation of levels of liver transaminase levels: Secondary | ICD-10-CM

## 2014-05-22 NOTE — Telephone Encounter (Signed)
Pt calling for results

## 2014-05-22 NOTE — Telephone Encounter (Signed)
Please review lab results from 05/20/2014.

## 2014-05-25 ENCOUNTER — Other Ambulatory Visit: Payer: Self-pay | Admitting: Certified Nurse Midwife

## 2014-05-25 DIAGNOSIS — E039 Hypothyroidism, unspecified: Secondary | ICD-10-CM

## 2014-05-25 MED ORDER — LEVOTHYROXINE SODIUM 25 MCG PO TABS: ORAL_TABLET | ORAL | Status: DC

## 2014-05-25 MED ORDER — VITAMIN D (ERGOCALCIFEROL) 1.25 MG (50000 UNIT) PO CAPS: 50000.0000 [IU] | ORAL_CAPSULE | ORAL | Status: DC

## 2014-05-25 NOTE — Telephone Encounter (Signed)
Notes Recorded by [name], CNM on 05/25/2014 at 7:44 AM Notify patient that Lipid panel looks much better, Cholesterol is 208 from 237, Triglycerides 157 from 298,HDL is normal and LDL is optimal level , keep working on exercise and good diet ALT is elevated again as before needs repeat if on OTC meds leave off if possible for one month and recheck order in please schedule Vitamin D is very low 15, protocol Thyroid panel is normal but TSH is still elevated from 6 months ago noted on lab profile, indicating hypothyroid needs to start on Synthroid 25 mcg and recheck in one month, very important order in

## 2014-05-25 NOTE — Telephone Encounter (Signed)
Spoke with patient and message from [name] CNM given.   ALT and TSH recheck ordered and scheduled for 06/30/14 at 1530  Vitamin D instructions given:  Order sent for rx Vitamin D (Drisdol) 50,000 unit capsules. 1 capsule per week for two months. Patient will start with 8 tablets over two months, then, vitmain D3 800 iu daily and return call for vitamin D check in four months. Vitamin D lab check placed.     Patient verbalized understanding to message and plan of care going forward.  Will call back prn or if any questions or concerns. Routing to provider for final review. Patient agreeable to disposition. Will close encounter

## 2014-05-25 NOTE — Telephone Encounter (Deleted)
-----   Message from [name], CNM sent at 05/25/2014  7:44 AM EDT ----- Notify patient that Lipid panel looks much better, Cholesterol is 208 from 237, Triglycerides 157 from 298,HDL is normal and LDL is optimal level , keep working on exercise and good diet ALT is elevated again as before needs repeat if on OTC meds leave off if possible for one month and recheck order in please schedule Vitamin D is very low 15, protocol Thyroid panel is normal but TSH is still elevated from 6 months ago noted on lab profile, indicating hypothyroid needs to start on Synthroid 25 mcg and recheck in one month, very important order in

## 2014-06-11 ENCOUNTER — Other Ambulatory Visit: Payer: Self-pay | Admitting: Certified Nurse Midwife

## 2014-06-11 DIAGNOSIS — E039 Hypothyroidism, unspecified: Secondary | ICD-10-CM

## 2014-06-11 MED ORDER — LEVOTHYROXINE SODIUM 25 MCG PO TABS
ORAL_TABLET | ORAL | Status: DC
Start: 2014-06-11 — End: 2014-08-01

## 2014-06-11 NOTE — Telephone Encounter (Signed)
Medication refill request: Synthroid 25 mcg  Last AEX:  11/11/13 with DL  Next AEX: No aex scheduled for 2016 Patient is scheduled for tsh recheck 06/30/14  Refill authorized: #[id number] rfs, please advise.

## 2014-06-11 NOTE — Telephone Encounter (Signed)
lost synthroid rx requests refill pharmacy: [facility]

## 2014-06-15 NOTE — Telephone Encounter (Signed)
LM on patient's vm that rx has been sent.

## 2014-06-30 ENCOUNTER — Other Ambulatory Visit: Payer: BC Managed Care – PPO

## 2014-06-30 ENCOUNTER — Telehealth: Payer: Self-pay | Admitting: Certified Nurse Midwife

## 2014-06-30 NOTE — Telephone Encounter (Signed)
Patient canceled lab appointment same day for a tsh and a alt.

## 2014-06-30 NOTE — Telephone Encounter (Signed)
Routed to DL 

## 2014-07-01 NOTE — Telephone Encounter (Signed)
Please try to get in touch with her regarding TSH, we need to make sure her level is coming down

## 2014-07-03 NOTE — Telephone Encounter (Signed)
Pt states that she has had a lot going on & hasnt started it yet. Pt states she read the side effects & wanted to finish her last week of school before she started it. Pt states she will start it tomorrow & will callback to make 22mth lab appt. Pt aware of importance of having this level rechecked.

## 2014-07-03 NOTE — Telephone Encounter (Signed)
Left message to call back  

## 2014-07-31 ENCOUNTER — Telehealth: Payer: Self-pay

## 2014-07-31 NOTE — Telephone Encounter (Signed)
Called pt to get her to schedule her lab appt for follow up of tsh & alt. Unable to leave voicemail message. Letter sent.

## 2014-07-31 NOTE — Telephone Encounter (Signed)
Mailbox is full unable to leave a message     Letter sent

## 2014-08-01 ENCOUNTER — Other Ambulatory Visit: Payer: Self-pay | Admitting: Certified Nurse Midwife

## 2014-08-04 NOTE — Telephone Encounter (Signed)
Medication refill request: Synthroid 25 mcg  Last AEX:  11/11/13 with DL  Next AEX: No AEX scheduled for 2016 Last TSH level checked: 05/20/14 at  7.023 Refill authorized: ?  Patient needs tsh checked according to last tsh level 05/20/14.

## 2014-08-04 NOTE — Telephone Encounter (Signed)
Left Message To Call Back  

## 2014-08-05 NOTE — Telephone Encounter (Signed)
Letter sent regarding

## 2014-08-07 NOTE — Telephone Encounter (Signed)
appt is 08-10-14

## 2014-08-07 NOTE — Telephone Encounter (Signed)
Patient called back she is aware of the need to come in to have her tsh checked. Patient also asked if she could have her sugar checked. She then said that she just got back from vacation and she removed two ticks off her back and both areas have red circles around then. Wanted to know if she could be tested for Lyme's disease I advised patient that that isn't something we typically check/treat for. I advised her to follow up with her PCP in regards to that and patient agreed.  [name] can you send in a refill and put orders in for tsh/hgba1c please?

## 2014-08-07 NOTE — Telephone Encounter (Signed)
Left Message To Call Back  

## 2014-08-10 ENCOUNTER — Other Ambulatory Visit: Payer: BC Managed Care – PPO

## 2014-08-10 ENCOUNTER — Other Ambulatory Visit: Payer: Self-pay | Admitting: Certified Nurse Midwife

## 2014-08-10 DIAGNOSIS — R740 Nonspecific elevation of levels of transaminase and lactic acid dehydrogenase [LDH]: Secondary | ICD-10-CM

## 2014-08-10 DIAGNOSIS — E039 Hypothyroidism, unspecified: Secondary | ICD-10-CM

## 2014-08-10 DIAGNOSIS — R7401 Elevation of levels of liver transaminase levels: Secondary | ICD-10-CM

## 2014-08-10 DIAGNOSIS — R6889 Other general symptoms and signs: Secondary | ICD-10-CM

## 2014-08-11 LAB — TRIGLYCERIDES: Triglycerides: 295 mg/dL — ABNORMAL HIGH (ref <150)

## 2014-08-11 LAB — TSH: TSH: 12.009 u[IU]/mL — ABNORMAL HIGH (ref 0.350–4.500)

## 2014-08-11 LAB — ALT: ALT: 28 U/L (ref 0–35)

## 2014-08-11 LAB — HEMOGLOBIN A1C
Hgb A1c MFr Bld: 5.9 % — ABNORMAL HIGH (ref <5.7)
Mean Plasma Glucose: 123 mg/dL — ABNORMAL HIGH (ref <117)

## 2014-08-11 LAB — VITAMIN D 25 HYDROXY (VIT D DEFICIENCY, FRACTURES): Vit D, 25-Hydroxy: 23 ng/mL — ABNORMAL LOW (ref 30–100)

## 2014-08-12 ENCOUNTER — Other Ambulatory Visit: Payer: Self-pay | Admitting: Certified Nurse Midwife

## 2014-08-12 DIAGNOSIS — E039 Hypothyroidism, unspecified: Secondary | ICD-10-CM

## 2014-08-12 MED ORDER — LEVOTHYROXINE SODIUM 50 MCG PO TABS: 50.0000 ug | ORAL_TABLET | Freq: Every day | ORAL | Status: DC

## 2014-08-18 ENCOUNTER — Ambulatory Visit (INDEPENDENT_AMBULATORY_CARE_PROVIDER_SITE_OTHER): Payer: BC Managed Care – PPO | Admitting: Nurse Practitioner

## 2014-08-18 ENCOUNTER — Encounter: Payer: Self-pay | Admitting: Nurse Practitioner

## 2014-08-18 VITALS — BP 122/92 | HR 85 | Temp 98.4°F | Resp 16 | Ht 65.5 in | Wt 191.0 lb

## 2014-08-18 DIAGNOSIS — E669 Obesity, unspecified: Secondary | ICD-10-CM | POA: Insufficient documentation

## 2014-08-18 DIAGNOSIS — R062 Wheezing: Secondary | ICD-10-CM

## 2014-08-18 DIAGNOSIS — R7309 Other abnormal glucose: Secondary | ICD-10-CM

## 2014-08-18 DIAGNOSIS — E781 Pure hyperglyceridemia: Secondary | ICD-10-CM | POA: Diagnosis not present

## 2014-08-18 DIAGNOSIS — R7303 Prediabetes: Secondary | ICD-10-CM | POA: Insufficient documentation

## 2014-08-18 DIAGNOSIS — E039 Hypothyroidism, unspecified: Secondary | ICD-10-CM | POA: Diagnosis not present

## 2014-08-18 DIAGNOSIS — E785 Hyperlipidemia, unspecified: Secondary | ICD-10-CM | POA: Insufficient documentation

## 2014-08-18 DIAGNOSIS — Z6828 Body mass index (BMI) 28.0-28.9, adult: Secondary | ICD-10-CM | POA: Insufficient documentation

## 2014-08-18 DIAGNOSIS — Z6831 Body mass index (BMI) 31.0-31.9, adult: Secondary | ICD-10-CM

## 2014-08-18 NOTE — Progress Notes (Signed)
Pre visit review using our clinic review tool, if applicable. No additional management support is needed unless otherwise documented below in the visit note. 

## 2014-08-18 NOTE — Progress Notes (Signed)
Subjective:     Tiffany Horn is a 54 y.o. female presents to establish care & has concerns about recent labs performed with gyn provider. She has new diagnosis of hypothyroidism, her gyn started thyroid replacement.  She is concerned about wheezing associated with hot weather & personal Hx of having pneumonia 4 times. She currently does not have PCP. A1C 5.9, triglycerides 295, Last TSH was 12-her synthroid was increased to 50 mcg from 25 mcg.  She reports desire to lose weight, fatigue, moodiness, pruritis without rash. I discussed specific diet interventions & activity changes to help with weight loss, reverse A1C trend & lower triglycerides. HO given.  She will continue to follow gyn to treat thyroid. Wheezing: denies cough, feels SOB w/activity. Will ref to pulm for eval.  The following portions of the patient's history were reviewed and updated as appropriate: allergies, current medications, past family history, past medical history, past social history, past surgical history and problem list.  Review of Systems Pertinent items are noted in HPI.    Objective:    BP 122/92 mmHg  Pulse 85  Temp(Src) 98.4 F (36.9 C) (Temporal)  Resp 16  Ht 5' 5.5" (1.664 m)  Wt 191 lb (86.637 kg)  BMI 31.29 kg/m2  SpO2 98%  LMP 01/31/2007 General appearance: alert, cooperative, appears stated age and no distress Eyes: negative findings: lids and lashes normal and conjunctivae and sclerae normal Lungs: clear to auscultation bilaterally Heart: regular rate and rhythm, S1, S2 normal, no murmur, click, rub or gallop Extremities: extremities normal, atraumatic, no cyanosis or edema Pulses: 2+ and symmetric Neurologic: Grossly normal    Assessment:    1. Wheezing - Ambulatory referral to Pulmonology  2. Prediabetes Diet changes & increase activity  3. Hypothyroidism, unspecified hypothyroidism type Cont to f/u w/gyn, unless you want PC to manage  4. Hypertriglyceridemia Diet changes &  increase activity  5. BMI 31.0-31.9,adult Diet changes & increase activity  See pt instructions  F/u 1 mo

## 2014-08-18 NOTE — Patient Instructions (Addendum)
Continue to cut out refined sugar: anything that is sweet when you eat or drink it, except fresh fruit. Cut out refined grains: white bread, rolls, biscuits, bagels, muffins, pasta and cereals. Choose grains with 4 gm or more of fiber per serving.   Develop lifelong habits of exercise most days of the week: take a 30 minute walk. The benefits include weight loss, lower risk for heart disease, diabetes, stroke, high blood pressure, lower rates of depression & dementia, better sleep quality & bone health.  Consider reading Eat to Live by Excell Seltzer and begin implementing principles. Replace foods made with flour with plant foods like beans, peas, other vegetables and fresh fruit.  Please see pulmonology regarding wheezing.  Please follow up in 1 month regarding weight loss & diet changes.  Very nice to meet you.

## 2014-08-26 ENCOUNTER — Ambulatory Visit (INDEPENDENT_AMBULATORY_CARE_PROVIDER_SITE_OTHER): Payer: BC Managed Care – PPO | Admitting: Pulmonary Disease

## 2014-08-26 ENCOUNTER — Encounter: Payer: Self-pay | Admitting: Pulmonary Disease

## 2014-08-26 ENCOUNTER — Other Ambulatory Visit (INDEPENDENT_AMBULATORY_CARE_PROVIDER_SITE_OTHER): Payer: BC Managed Care – PPO

## 2014-08-26 ENCOUNTER — Institutional Professional Consult (permissible substitution): Payer: BC Managed Care – PPO | Admitting: Pulmonary Disease

## 2014-08-26 ENCOUNTER — Ambulatory Visit (INDEPENDENT_AMBULATORY_CARE_PROVIDER_SITE_OTHER)
Admission: RE | Admit: 2014-08-26 | Discharge: 2014-08-26 | Disposition: A | Discharge status: Home / Self Care | Payer: BC Managed Care – PPO | Source: Ambulatory Visit | Attending: Pulmonary Disease | Admitting: Pulmonary Disease

## 2014-08-26 VITALS — BP 114/64 | HR 84 | Temp 97.8°F | Ht 66.5 in | Wt 190.0 lb

## 2014-08-26 DIAGNOSIS — R0600 Dyspnea, unspecified: Secondary | ICD-10-CM

## 2014-08-26 DIAGNOSIS — R0609 Other forms of dyspnea: Secondary | ICD-10-CM | POA: Insufficient documentation

## 2014-08-26 DIAGNOSIS — K219 Gastro-esophageal reflux disease without esophagitis: Secondary | ICD-10-CM

## 2014-08-26 DIAGNOSIS — R059 Cough, unspecified: Secondary | ICD-10-CM

## 2014-08-26 DIAGNOSIS — R05 Cough: Secondary | ICD-10-CM | POA: Diagnosis not present

## 2014-08-26 LAB — CBC WITH DIFFERENTIAL/PLATELET
Basophils Absolute: 0 10*3/uL (ref 0.0–0.1)
Basophils Relative: 0.5 % (ref 0.0–3.0)
Eosinophils Absolute: 0.2 10*3/uL (ref 0.0–0.7)
Eosinophils Relative: 3.3 % (ref 0.0–5.0)
HCT: 43.7 % (ref 36.0–46.0)
Hemoglobin: 14.8 g/dL (ref 12.0–15.0)
Lymphocytes Relative: 32.6 % (ref 12.0–46.0)
Lymphs Abs: 2.3 10*3/uL (ref 0.7–4.0)
MCHC: 33.9 g/dL (ref 30.0–36.0)
MCV: 87.2 fl (ref 78.0–100.0)
Monocytes Absolute: 0.5 10*3/uL (ref 0.1–1.0)
Monocytes Relative: 7.5 % (ref 3.0–12.0)
Neutro Abs: 3.9 10*3/uL (ref 1.4–7.7)
Neutrophils Relative %: 56.1 % (ref 43.0–77.0)
Platelets: 247 10*3/uL (ref 150.0–400.0)
RBC: 5.01 Mil/uL (ref 3.87–5.11)
RDW: 13.5 % (ref 11.5–15.5)
WBC: 7 10*3/uL (ref 4.0–10.5)

## 2014-08-26 NOTE — Progress Notes (Signed)
Subjective:    Patient ID: Tiffany Horn, female    DOB: 02/24/1960, 53 y.o.   MRN: [id number]  HPI She reports that over the last couple of months she has noticed dyspnea on exertion that is out of the ordinary for her. She is finding it difficult to do her usual daily activities such as cleaning and yard work. She feels her symptoms have remained stable over the last month. She reports she has had pneumonia 4 times total with her last episode in December of 2015 where she was hospitalized in [place], [place]. She has never been on a ventilator or intubated. She routinely gets bronchitis twice yearly treated with antibiotics. She continues to have a daily, nonproductive cough. She denies any worsening of her cough with exposure to inhaled irritants or heat. She has also had wheezing. She has had chills and sweats at night but no subjective fever. She reports she has had a couple of "episodes" of chest "tightness" that resolves within seconds. She denies any rashes or bruising. She denies any history of allergies or sinus congestion/drainage. She denies any breathing problems or recurrent bronchitis as a child or young adult. She reports chronic pain in her knees. She denies any other joint swelling or stiffness.  She had a tick bite a couple of weeks ago and was placed on Doxycycline without any help in her cough.   Review of Systems She reports frequent reflux over the last month. She denies any morning brash water taste or odynophagia. No melena, hematochezia or hematuria. A pertinent 14 point review of systems is negative except as per the history of presenting illness.  No Known Allergies  Current Outpatient Prescriptions on File Prior to Visit  Medication Sig Dispense Refill  . buPROPion (WELLBUTRIN XL) 300 MG 24 hr tablet Take 300 mg by mouth daily.    Marland Kitchen doxycycline (VIBRA-TABS) 100 MG tablet Take 100 mg by mouth 2 (two) times daily.     Marland Kitchen ibuprofen (ADVIL,MOTRIN) 800 MG tablet Take 800 mg by mouth  every 8 (eight) hours as needed for moderate pain.    Marland Kitchen levothyroxine (SYNTHROID, LEVOTHROID) 50 MCG tablet Take 1 tablet (50 mcg total) by mouth daily. 30 tablet 1  . sertraline (ZOLOFT) 100 MG tablet Take 200 mg by mouth daily.    . Vitamin D, Ergocalciferol, (DRISDOL) 50000 UNITS CAPS capsule Take 1 capsule (50,000 Units total) by mouth every 7 (seven) days. 8 capsule 0  . [DISCONTINUED] Asenapine Maleate (SAPHRIS SL) Place under the tongue. 2 at bedtime     No current facility-administered medications on file prior to visit.   Past Medical History  Diagnosis Date  . Migraines   . Depression   . Anxiety   . Hypertension     not taking any meds at present  . Arthritis     knees and spine  . Hx of irritable bowel syndrome     x2  . Complication of anesthesia     waking up is not easy  . PONV (postoperative nausea and vomiting)   . H/O miscarriage, not currently pregnant   . Pneumonia     4 episodes  . Bronchitis    Past Surgical History  Procedure Laterality Date  . Tonsillectomy and adenoidectomy    . Laparoscopic abdominal exploration  1994    endometriosis  . Dilation and curettage of uterus    . Breast surgery      implants, then had them removed  . Orif humerus fracture  Left 04/01/2013    DR Ninfa Linden  . Orif humerus fracture Left 04/01/2013    Procedure: OPEN REDUCTION INTERNAL FIXATION (ORIF) LEFT PROXIMAL HUMERUS FRACTURE;  Surgeon: Mcarthur Rossetti, MD;  Location: [facility];  Service: Orthopedics;  Laterality: Left;   Family History  Problem Relation Age of Onset  . Breast cancer Mother   . Diabetes Mother   . Cancer Father     colon  . Diabetes Father   . Dementia Father   . Stroke Paternal Grandmother   . Diabetes Paternal Grandmother   . Colon cancer Maternal Uncle    History   Social History  . Marital Status: Divorced    Spouse Name: N/A  . Number of Children: 0  . Years of Education: N/A   Occupational History  . Control and instrumentation engineer     Social History Main Topics  . Smoking status: Passive Smoke Exposure - Never Smoker  . Smokeless tobacco: Never Used     Comment: as a very young child  . Alcohol Use: 1.2 - 1.8 oz/week    2-3 Cans of beer per week     Comment: 1-2 beers a week  . Drug Use: No  . Sexual Activity: No   Other Topics Concern  . None   Social History Narrative   She is originally from [place]. She has traveled to [facility], [place], [place], [place], [place], , [place], [place], [place]. No international travel. She has dogs. No prior bird, mold, or recent hot tub exposure. She hasn't used her hot tub in 1.5 years. 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 Previously enjoyed gardening and playing tennis. Helps to care for her mother.       Objective:   Physical Exam Blood pressure 114/64, pulse 84, temperature 97.8 F (36.6 C), temperature source Oral, height 5' 6.5" (1.689 m), weight 190 lb (86.183 kg), last menstrual period 01/31/2007, SpO2 97 %. General:  Awake. Alert. No acute distress. Mildly obese female. Integument:  Warm & dry. No rash on exposed skin. No bruising. Lymphatics:  No appreciated cervical or supraclavicular lymphadenoapthy. HEENT:  Moist mucus membranes. No oral ulcers. No scleral injection or icterus. PERRL. Cardiovascular:  Regular rate. No edema. No appreciable JVD.  Pulmonary:  Good aeration & clear to auscultation bilaterally. Symmetric chest wall expansion. No accessory muscle use. Abdomen: Soft. Normal bowel sounds. Nondistended. Very mild/minimal suprapubic tenderness to deep palpation. Musculoskeletal:  Normal bulk and tone. Hand grip strength 5/5 bilaterally. No joint deformity or effusion appreciated. Neurological:  CN 2-12 grossly in tact. No meningismus. Moving all 4 extremities equally. Symmetric patellar deep tendon reflexes. Psychiatric:  Mood and affect congruent. Speech normal rhythm, rate & tone.   LABS 05/20/14 BMP:  140/4.1/106/22/15/0.69/91/9.4 LFT: 4.1/6.8/0.4/71/25/38    Assessment & Plan:  The patient is a 54 year old female with a history of recurrent bronchitis and 4 episodes of pneumonia. Certainly her exposure through work to small children could be the likely etiology for her frequent illnesses. There is no personal or family history of asthma or lung disease that would suggest asthma as a possibility; although, this must still be entertained given her symptom description. An autoimmune deficiency is additionally possible such as IgM. With the increase in her symptomatic reflux further silent pharyngoesophageal reflux is possibly confounding the clinical picture as well. I instructed the patient to contact my office if she had any further questions or concerns before her next appointment.  1.  Dyspnea: Checking serum CBC. Checking full PFT. Checking chest x-ray PA/LAT today. 2. Cough: Initiating treatment of underlying reflux. Holding on inhaler therapy at this time. Checking chest x-ray PA/LAT today. 3. GERD: Patient advised to start taking Zantac 75 mg by mouth daily at bedtime or its generic equivalent. 4. History of pneumonia/bronchitis: Checking quantitative immunoglobulin panel. 5. Follow-up: Patient to return to clinic in 2-4 weeks.

## 2014-08-26 NOTE — Patient Instructions (Signed)
1. I am checking breathing test to determine whether or not she may have underlying asthma. 2. I'm doing blood work to check her immune system and to make sure you don't have anemia contributing to her symptoms. 3. Please start taking Zantac 75 mg or its generic equivalent before bedtime to help with reflux. 4. We are checking a chest x-ray today to ensure there is no obvious source of disease in her lungs contributing to her symptoms. 5. I will see her back in 2-4 weeks but please feel free to contact my office if you have any further questions or concerns.

## 2014-08-27 LAB — IGG, IGA, IGM
IgA: 200 mg/dL (ref 69–380)
IgG (Immunoglobin G), Serum: 1300 mg/dL (ref 690–1700)
IgM, Serum: 163 mg/dL (ref 52–322)

## 2014-09-01 ENCOUNTER — Encounter: Payer: Self-pay | Admitting: Family Medicine

## 2014-09-01 ENCOUNTER — Ambulatory Visit (INDEPENDENT_AMBULATORY_CARE_PROVIDER_SITE_OTHER): Payer: BC Managed Care – PPO | Admitting: Family Medicine

## 2014-09-01 ENCOUNTER — Ambulatory Visit: Payer: BC Managed Care – PPO | Admitting: Family Medicine

## 2014-09-01 VITALS — BP 126/84 | HR 74 | Temp 97.4°F | Ht 66.0 in | Wt 185.0 lb

## 2014-09-01 DIAGNOSIS — E039 Hypothyroidism, unspecified: Secondary | ICD-10-CM | POA: Diagnosis not present

## 2014-09-01 DIAGNOSIS — F418 Other specified anxiety disorders: Secondary | ICD-10-CM

## 2014-09-01 MED ORDER — BUPROPION HCL ER (XL) 300 MG PO TB24: 300.0000 mg | ORAL_TABLET | Freq: Every day | ORAL | Status: DC

## 2014-09-01 MED ORDER — SERTRALINE HCL 100 MG PO TABS: 200.0000 mg | ORAL_TABLET | Freq: Every day | ORAL | Status: DC

## 2014-09-01 MED ORDER — CLONAZEPAM 1 MG PO TABS: 1.0000 mg | ORAL_TABLET | Freq: Two times a day (BID) | ORAL | Status: DC | PRN

## 2014-09-01 NOTE — Patient Instructions (Signed)
I would like to see you back at the very beginning of September, at that time we will check in on her depression and anxiety and repeat your TSH. Continue your thyroid medication at 50 g as prescribed. I have prescribed you Klonopin, I want you to take this before bed, and then if you need he can take it one other time throughout the day. Be patient with your feelings right now as some of this is contributed to your thyroid. Prior to your return would like you to look into finding at least 1 new hobby you may be interested in or even have attempted to try before you see me next time. If you have any concerns that need immediate attention with your depression/anxiety please call in to be seen her, if it is after hours over we do not have room for an appointment there is a 24 hour line at the [facility] that is open to everyone. They also have counseling services there is well if you would like to talk to a therapist. [facility]: [phone]

## 2014-09-02 ENCOUNTER — Encounter: Payer: Self-pay | Admitting: Family Medicine

## 2014-09-02 ENCOUNTER — Telehealth: Payer: Self-pay | Admitting: Pulmonary Disease

## 2014-09-02 DIAGNOSIS — R0600 Dyspnea, unspecified: Secondary | ICD-10-CM

## 2014-09-02 DIAGNOSIS — R05 Cough: Secondary | ICD-10-CM

## 2014-09-02 DIAGNOSIS — R059 Cough, unspecified: Secondary | ICD-10-CM

## 2014-09-02 DIAGNOSIS — F330 Major depressive disorder, recurrent, mild: Secondary | ICD-10-CM | POA: Insufficient documentation

## 2014-09-02 DIAGNOSIS — F418 Other specified anxiety disorders: Secondary | ICD-10-CM | POA: Insufficient documentation

## 2014-09-02 NOTE — Telephone Encounter (Signed)
Tiffany Horn. Please call Tiffany Horn and let her know her blood work shows a normal immune system and no evidence of infection. Her chest X-ray doesn't show any evidence of pneumonia. Let her know we'll review her pulmonary function testing at her followup appointment. Also, if she's not seeing any improvement in symptoms please let me know as we may want to do a CT scan of her chest to get a better picture of her lungs. Thanks.

## 2014-09-02 NOTE — Progress Notes (Signed)
Subjective:    Patient ID: Tiffany Horn, female    DOB: [birth year], 54 y.o.   MRN: [id number]  HPI  Depression: Patient presents to office visit today for worsening depression. Patient currently takes Wellbutrin XL 300 mg and Zoloft 200 mg daily. She has been on both these medications for some time, but has felt an increase in her depression and anxiety since her diagnosis of hypothyroidism in May. Patient has been started on Synthroid for her hypothyroid, but she is currently being tapered up on her thyroid replacement and is taking 50 g now. 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 However she feels overwhelmed in unsupported with the care of her mother has dementia. She is single, with no children, and has nobody in the area to help her with the care of her mother. She recently hurt her left shoulder, which is not allowing her to enjoy the activities she's had in the past. She is unhappy with her weight gain. She states she feels "wacky" which she describes as "fuzzy" and she is just not "with it ". She states that she's having difficulty sleeping because she'll lay awake at night thinking of things and worrying about something going wrong with her mother. Her dog recently passed away, she does have other pets as well. She has 2-3 friends in the area, but they all are either married or have children and her very busy in their own lives. She has seen a therapist in the past for her depression, but has never felt was beneficial and felt like it was irritating to her.  Hypothyroid: Patient was diagnosed in May to be hypothyroid through her gynecologist. Her TSH at that time was 7.023. She was started on 25 g of Synthroid. Repeat TSH 3 weeks ago was 12.009. At that time patient was increased to 50 g of Synthroid. Patient states the the weight gain, fatigue and depression is getting to her, so she decided to take 1-1/2 the pills to expedite her thyroid replacement.  She states she felt a little better, but admits to increased feelings of anxiety, flushing and diarrhea. She also admits to noticing approximate 5 pounds of weight loss.  Never smoker Past Medical History  Diagnosis Date  . Migraines   . Depression   . Anxiety   . Hypertension     not taking any meds at present  . Arthritis     knees and spine  . Hx of irritable bowel syndrome     x2  . Complication of anesthesia     waking up is not easy  . PONV (postoperative nausea and vomiting)   . H/O miscarriage, not currently pregnant   . Pneumonia     4 episodes  . Bronchitis    No Known Allergies Past Surgical History  Procedure Laterality Date  . Tonsillectomy and adenoidectomy    . Laparoscopic abdominal exploration  1994    endometriosis  . Dilation and curettage of uterus    . Breast surgery      implants, then had them removed  . Orif humerus fracture Left 04/01/2013    DR Ninfa Linden  . Orif humerus fracture Left 04/01/2013    Procedure: OPEN REDUCTION INTERNAL FIXATION (ORIF) LEFT PROXIMAL HUMERUS FRACTURE;  Surgeon: Mcarthur Rossetti, MD;  Location: [facility];  Service: Orthopedics;  Laterality: Left;    Review of Systems Negative, with the exception of above mentioned in HPI     Objective:  Physical Exam BP 126/84 mmHg  Pulse 74  Temp(Src) 97.4 F (36.3 C) (Oral)  Ht 5\' 6"  (1.676 m)  Wt 185 lb (83.915 kg)  BMI 29.87 kg/m2  SpO2 94%  LMP 01/31/2007 Gen: NAD. Nontoxic in appearance, well-developed, well-nourished, overweight Caucasian female. Psych: Tearful, sad. Smiles on occasion. Makes good eye contact. Normal speech. Mildly slowed behavior. Thought content normal, no SI or HI. Cognition and memory intact. Normal judgment.     Assessment & Plan:  Tiffany Horn is a 54 y.o. presents for depression and anxiety.  -Depression anxiety: Patient has a history of depression and anxiety in the past, recently has sustained a new diagnosis of hypothyroid and is  currently being tapered up on thyroid replacement appropriately. Discussed with her that some of these feelings she has described can be attributed to her hypothyroidism. Discussed symptoms of hypothyroidism. Patient seemed to find some comfort and that she identified with multiple symptoms discussed. No SI or HI admitted on visit, however 24 hour call line for [facility] [place] was provided to her in the event she felt she needed to talk to someone immediately. - Encouraged her to consider seeking counseling if she felt her feelings were worsening, [facility] [place] contact information was given. - After lengthy discussion, we decided to add Klonopin to her regimen. She is to take this prior to bed, and if needed one additional time throughout the day. - Encouraged her to look into different types of activities she may be able to do an enjoy. We discussed book club, painting classes, pottery classes, walking groups, water  Aerobics etc. She had some hesitation that she does have ill animals at home that need her care, but she is willing to look into some of these. I have asked her to find something that she is interested in maybe even attend one class/event if able prior to her follow-up appointment.  - Hypothyroid: Encourage patient to only take the 50 g as indicated until we have a follow-up visit to recheck her TSH levels. Educated her on the proper way to taper thyroid supplementation and reasoning.   follow-up on depression and hypothyroid beginning of September (4 weeks). At that time we will recheck her TSH since he'll be 6 weeks since her dose change.  25 minutes or more of face to face counseling with the patient.

## 2014-09-02 NOTE — Assessment & Plan Note (Signed)
 -   Hypothyroid: Encourage patient to only take the 50 g as indicated until we have a follow-up visit to recheck her TSH levels. Educated her on the proper way to taper thyroid supplementation and reasoning.   follow-up on depression and hypothyroid beginning of September (4 weeks). At that time we will recheck her TSH since he'll be 6 weeks since her dose change.

## 2014-09-02 NOTE — Telephone Encounter (Signed)
Changed PFT to [facility] per pt request. States she needs it done before her return to work on 8/22.  Pt calling wanting lab results. Please advise.

## 2014-09-02 NOTE — Telephone Encounter (Signed)
I spoke with patient about results and she verbalized understanding and had no questions 

## 2014-09-02 NOTE — Assessment & Plan Note (Signed)
Tiffany Horn is a 54 y.o. presents for depression and anxiety.  -Depression anxiety: Patient has a history of depression and anxiety in the past, recently has sustained a new diagnosis of hypothyroid and is currently being tapered up on thyroid replacement appropriately. Discussed with her that some of these feelings she has described can be attributed to her hypothyroidism. Discussed symptoms of hypothyroidism. Patient seemed to find some comfort and that she identified with multiple symptoms discussed. No SI or HI admitted on visit, however 24 hour call line for family services [facility] [place] was provided to her in the event she felt she needed to talk to someone immediately. - Encouraged her to consider seeking counseling if she felt her feelings were worsening, [facility] [place] contact information was given. - After lengthy discussion, we decided to add Klonopin to her regimen. She is to take this prior to bed, and if needed one additional time throughout the day. - Encouraged her to look into different types of activities she may be able to do an enjoy. We discussed book club, painting classes, pottery classes, walking groups, water  Aerobics etc. She had some hesitation that she does have ill animals at home that need her care, but she is willing to look into some of these. I have asked her to find something that she is interested in maybe even attend one class/event if able prior to her follow-up appointment.  Follow-up in 4 weeks.

## 2014-09-04 ENCOUNTER — Other Ambulatory Visit (HOSPITAL_COMMUNITY): Payer: Self-pay | Admitting: Respiratory Therapy

## 2014-09-08 ENCOUNTER — Institutional Professional Consult (permissible substitution): Payer: BC Managed Care – PPO | Admitting: Pulmonary Disease

## 2014-09-10 ENCOUNTER — Encounter (HOSPITAL_COMMUNITY): Payer: BC Managed Care – PPO

## 2014-09-15 ENCOUNTER — Telehealth: Payer: Self-pay

## 2014-09-15 NOTE — Telephone Encounter (Signed)
Pt states she went in for a consult with her pcp Nicky Pugh who said to tell you hello. They have her coming back in on 10-19-14 for 110mth follow up & pt states they are going to also follow her tsh since they will be checking all of her labs. Pt states she just wanted to have a pcp who can follow all of her labs & she will still see [place] for her gyn visit. You can cancel her f/u lab orders with [place].

## 2014-09-15 NOTE — Telephone Encounter (Signed)
-----   Message from Tiffany Horn, [place] sent at 08/27/2014  8:05 AM EDT ----- Pt to have tsh recheck appt scheduled. Nothing scheduled yet

## 2014-09-18 ENCOUNTER — Ambulatory Visit: Payer: BC Managed Care – PPO | Admitting: Family Medicine

## 2014-10-02 ENCOUNTER — Telehealth: Payer: Self-pay | Admitting: Family Medicine

## 2014-10-02 MED ORDER — CLONAZEPAM 1 MG PO TABS: 1.0000 mg | ORAL_TABLET | Freq: Three times a day (TID) | ORAL | Status: DC | PRN

## 2014-10-02 NOTE — Telephone Encounter (Signed)
Printed klonopin script at increased dose. Please fax to pharmacy. thanks

## 2014-10-02 NOTE — Telephone Encounter (Signed)
I am willing to fax in 1 month of klonopin TID (was BID) since it is the weekend and a holiday Monday. However patient will need to follow-up next week on anxiety/depression. Id she can come in today, although it would have to be earlier than 3:30 (we do not have a 30 minute appointment available at 3:30), She should also be reminded of [facility], [facility] and [facility] 24 hour availability if she needs immediate assistance after hours/weekends for anxiety/depression.  - please inform me of her decision.

## 2014-10-02 NOTE — Telephone Encounter (Signed)
Pt would like to try 3 times a day on Klonopin and keep her upcoming appt.  I advised her about services at [facility] if needed. Please call in more Klonopin.

## 2014-10-02 NOTE — Telephone Encounter (Addendum)
LMOM for pt to CB.  Patient will need to come in for OV.  Patient was supposed to f/u in 4 weeks.

## 2014-10-02 NOTE — Telephone Encounter (Signed)
Rx faxed to [facility]. 

## 2014-10-02 NOTE — Telephone Encounter (Signed)
Pt is requesting a different or stronger anti depressant as her anxiety and depression are out of control. She asked Dr. Raoul Pitch to text her at [phone] and let her know if she is calling something in or if she needs to come in this pm. She can not get here until 3:30.

## 2014-10-19 ENCOUNTER — Encounter: Payer: Self-pay | Admitting: Family Medicine

## 2014-10-19 ENCOUNTER — Ambulatory Visit (INDEPENDENT_AMBULATORY_CARE_PROVIDER_SITE_OTHER): Payer: BC Managed Care – PPO | Admitting: Family Medicine

## 2014-10-19 VITALS — BP 122/84 | HR 81 | Temp 98.2°F | Resp 16 | Ht 66.0 in | Wt 180.0 lb

## 2014-10-19 DIAGNOSIS — Z800 Family history of malignant neoplasm of digestive organs: Secondary | ICD-10-CM | POA: Diagnosis not present

## 2014-10-19 DIAGNOSIS — Z0000 Encounter for general adult medical examination without abnormal findings: Secondary | ICD-10-CM | POA: Insufficient documentation

## 2014-10-19 DIAGNOSIS — E039 Hypothyroidism, unspecified: Secondary | ICD-10-CM | POA: Diagnosis not present

## 2014-10-19 DIAGNOSIS — Z1159 Encounter for screening for other viral diseases: Secondary | ICD-10-CM | POA: Insufficient documentation

## 2014-10-19 DIAGNOSIS — R5383 Other fatigue: Secondary | ICD-10-CM

## 2014-10-19 DIAGNOSIS — Z114 Encounter for screening for human immunodeficiency virus [HIV]: Secondary | ICD-10-CM | POA: Diagnosis not present

## 2014-10-19 DIAGNOSIS — Z23 Encounter for immunization: Secondary | ICD-10-CM

## 2014-10-19 DIAGNOSIS — F418 Other specified anxiety disorders: Secondary | ICD-10-CM

## 2014-10-19 DIAGNOSIS — E559 Vitamin D deficiency, unspecified: Secondary | ICD-10-CM | POA: Insufficient documentation

## 2014-10-19 NOTE — Patient Instructions (Signed)
Cholesterol Cholesterol is a white, waxy, fat-like substance needed by your body in small amounts. The liver makes all the cholesterol you need. Cholesterol is carried from the liver by the blood through the blood vessels. Deposits of cholesterol (plaque) may build up on blood vessel walls. These make the arteries narrower and stiffer. Cholesterol plaques increase the risk for heart attack and stroke.  You cannot feel your cholesterol level even if it is very high. The only way to know it is high is with a blood test. Once you know your cholesterol levels, you should keep a record of the test results. Work with your health care provider to keep your levels in the desired range.  WHAT DO THE RESULTS MEAN?  Total cholesterol is a rough measure of all the cholesterol in your blood.   LDL is the so-called bad cholesterol. This is the type that deposits cholesterol in the walls of the arteries. You want this level to be low.   HDL is the good cholesterol because it cleans the arteries and carries the LDL away. You want this level to be high.  Triglycerides are fat that the body can either burn for energy or store. High levels are closely linked to heart disease.  WHAT ARE THE DESIRED LEVELS OF CHOLESTEROL?  Total cholesterol below 200.   LDL below 100 for people at risk, below 70 for those at very high risk.   HDL above 50 is good, above 60 is best.   Triglycerides below 150.  HOW CAN I LOWER MY CHOLESTEROL?  Diet. Follow your diet programs as directed by your health care provider.   Choose fish or white meat chicken and [place], roasted or baked. Limit fatty cuts of red meat, fried foods, and processed meats, such as sausage and lunch meats.   Eat lots of fresh fruits and vegetables.  Choose whole grains, beans, pasta, potatoes, and cereals.   Use only small amounts of olive, corn, or canola oils.   Avoid butter, mayonnaise, shortening, or palm kernel oils.  Avoid foods with  trans fats.   Drink skim or nonfat milk and eat low-fat or nonfat yogurt and cheeses. Avoid whole milk, cream, ice cream, egg yolks, and full-fat cheeses.   Healthy desserts include angel food cake, ginger snaps, animal crackers, hard candy, popsicles, and low-fat or nonfat frozen yogurt. Avoid pastries, cakes, pies, and cookies.   Exercise. Follow your exercise programs as directed by your health care provider.   A regular program helps decrease LDL and raise HDL.   A regular program helps with weight control.   Do things that increase your activity level like gardening, walking, or taking the stairs. Ask your health care provider about how you can be more active in your daily life.   Medicine. Take medicine only as directed by your health care provider.   Medicine may be prescribed by your health care provider to help lower cholesterol and decrease the risk for heart disease.   If you have several risk factors, you may need medicine even if your levels are normal. Document Released: 10/11/2000 Document Revised: 06/02/2013 Document Reviewed: 10/30/2012 [facility] Patient Information 2015 [place], [place]. This information is not intended to replace advice given to you by your health care provider. Make sure you discuss any questions you have with your health care provider.  2 g a day of fish oil supplement. Watch your diet, eating low saturated fats.  Exercise: great for heart health, exercise and depression (150  Minutes  of exercise a week) Psych referral placed today Labs collected and you will be called with results.

## 2014-10-19 NOTE — Progress Notes (Signed)
Subjective:    Patient ID: Tiffany Horn, female    DOB: 12/27/1960, 54 y.o.   MRN: [id number]  HPI  Vitamin D deficiency: patient has a history of vitamin D deficiency, last vitamin D level collected July 2016, was 23. Prior vitamin D levels has been as low as 15. Patient does not take over-the-counter supplementations, she was prescribed an 8 week 50,000 unit weekly dose, that has been completed. Patient states she doesn't feel any different after the vitamin D repletion.  Vitamin B deficiency: Patient states she used to be vitamin B deficient a few years ago. She was given injections to supplement her vitamin B, and felt her energy level was improved. She states she currently feels like her vitamin B is low again, and is wondering if she should get injections. She does not take over-the-counter supplementation a vitamin B.  Hypothyroidism: Patient is continuing to 50 g dose of levothyroxine. She states she doesn't feel any different since on this medication. She has noticed a 5 pound weight loss. She still complains of decreased energy and increased depression. She denies constipation.  Depression with anxiety: Patient with increasing depression over the last few months. Patient is on Zoloft and Wellbutrin maximum doses. She has been on his medications for years. On her last office visit we added Klonopin 1 mg 3 times a day, which she states helped a small amount but not for her depression. 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 She states her work environment or rale is low, she enjoys her job but she has noticed morale being low with her employees. She has started to make social engagements 1 time a week with her girlfriends. She had a pet that  passed away last month, and states that she has another pet  that is getting ready to pass away.  Health maintenance:  Colonoscopy:FHX in father at age 85, she reports early screening  without follow up. In need of repeat screening.  Mammogram: breast cancer family history in her Mother. Scheduled Mammogram next month.  Cervical cancer screening: has Gyn scheduled next month Immunizations: Flu UTD, Tdap UTD Infectious disease screening: HIV/Hep c needed  Review of Systems Negative, with the exception of above mentioned in HPI     Objective:   Physical Exam BP 122/84 mmHg  Pulse 81  Temp(Src) 98.2 F (36.8 C) (Temporal)  Resp 16  Ht 5\' 6"  (1.676 m)  Wt 180 lb (81.647 kg)  BMI 29.07 kg/m2  SpO2 95%  LMP 01/31/2007 Gen: Afebrile. No acute distress. Nontoxic in appearance, well-developed, well-nourished, overweight Caucasian female. Neck: Supple, no lymphadenopathy, no thyromegaly CV: RRR no murmur appreciated, no edema, +2/4 P posterior tibialis pulses Skin: No rashes, purpura or petechiae.  Psych: Normal affect, dress and demeanor. Normal speech. Normal thought content and judgment. Smiling.     Assessment & Plan:  1. Hypothyroidism, unspecified hypothyroidism type - TSH - Synthroid will be supplemented based on lab results.   2. Vitamin D deficiency - Vitamin D (25 hydroxy)  3. Decreased energy - B12  4. Depression with anxiety - continue current regimen - [facility] [place] or [facility] in the event of needing emergent services.  - Ambulatory referral to Psychiatry; patient is not improving and she would like to see if she is a candidate for ECT. Dicussed they will be able to offer her therapy and medication management as well.   5.  Health maintenance:  Colonoscopy:FHX in father at age 43, she reprts early screening without follow up. In need of repeat screening. Referral placed today, prior completed in [place].  Mammogram: breast cancer family history in her Mother. Scheduled Mammogram next month.  Cervical cancer screening: has Gyn scheduled next month Immunizations: Flu UTD, Tdap UTD Infectious disease screening: HIV/Hep c collected  today  F/U dependent on labs, if normal labs and pt gets into psych quickly, follow up 6 months. Sooner if needed.   > 25 minutes spent with patient, >50% of time spent face to face counseling patient and coordinating care.

## 2014-10-19 NOTE — Progress Notes (Signed)
Pre visit review using our clinic review tool, if applicable. No additional management support is needed unless otherwise documented below in the visit note. 

## 2014-10-20 ENCOUNTER — Telehealth: Payer: Self-pay | Admitting: Family Medicine

## 2014-10-20 DIAGNOSIS — E538 Deficiency of other specified B group vitamins: Secondary | ICD-10-CM

## 2014-10-20 DIAGNOSIS — E039 Hypothyroidism, unspecified: Secondary | ICD-10-CM

## 2014-10-20 LAB — VITAMIN D 25 HYDROXY (VIT D DEFICIENCY, FRACTURES): VITD: 27.27 ng/mL — ABNORMAL LOW (ref 30.00–100.00)

## 2014-10-20 LAB — HIV ANTIBODY (ROUTINE TESTING W REFLEX): HIV 1&2 Ab, 4th Generation: NONREACTIVE

## 2014-10-20 LAB — HEPATITIS C ANTIBODY: HCV Ab: NEGATIVE

## 2014-10-20 LAB — TSH: TSH: 5.36 u[IU]/mL — ABNORMAL HIGH (ref 0.35–4.50)

## 2014-10-20 LAB — VITAMIN B12: Vitamin B-12: 308 pg/mL (ref 211–911)

## 2014-10-20 MED ORDER — VITAMIN D (ERGOCALCIFEROL) 1.25 MG (50000 UNIT) PO CAPS: 50000.0000 [IU] | ORAL_CAPSULE | ORAL | Status: DC

## 2014-10-20 MED ORDER — LEVOTHYROXINE SODIUM 88 MCG PO TABS: 88.0000 ug | ORAL_TABLET | Freq: Every day | ORAL | Status: DC

## 2014-10-20 MED ORDER — VITAMIN B-12 1000 MCG PO TABS
ORAL_TABLET | ORAL | Status: DC
Start: 2014-10-20 — End: 2015-02-15

## 2014-10-20 NOTE — Telephone Encounter (Signed)
Please call pt, her labs resulted with the following: - Thyroid: still mildly under-replaced but much improved. I have called in a increased dose of 88 mcg of synthroid. She will need level retested in 8 weeks.  - Vitamin D: still very low at 23, I have called 12 more weeks of vitamin D supplement (1x/w). She will need to have level retested in 12-14 weeks.  - Vitamin b12 low normal at 308, this should be replaced.I have called in an oral supplement at 2000 mcg for 2 weeks and then daily 1000 mcg. Weill retest with collection of TSH.  - Hep C and HIV screenings normal (negative) Thanks

## 2014-10-21 NOTE — Telephone Encounter (Signed)
Left message for pt to call back  °

## 2014-10-21 NOTE — Telephone Encounter (Signed)
Pt advised and voiced understanding.   

## 2014-10-30 ENCOUNTER — Encounter: Payer: Self-pay | Admitting: Gastroenterology

## 2014-10-30 ENCOUNTER — Telehealth: Payer: Self-pay | Admitting: Family Medicine

## 2014-10-30 NOTE — Telephone Encounter (Signed)
Patient is having issues with balance & is feeling dizzy, she would like to have a brain scan. Patient also is requesting [place] to send a referral to [facility] in [place].

## 2014-10-30 NOTE — Telephone Encounter (Signed)
Left detailed message on pt's cell to schedule appointment for evaluation of balance and dizziness issues.  Okay to leave message per DPR.

## 2014-11-02 ENCOUNTER — Encounter: Payer: Self-pay | Admitting: Family Medicine

## 2014-11-04 ENCOUNTER — Telehealth: Payer: Self-pay | Admitting: Family Medicine

## 2014-11-04 DIAGNOSIS — F322 Major depressive disorder, single episode, severe without psychotic features: Secondary | ICD-10-CM

## 2014-11-04 NOTE — Telephone Encounter (Signed)
She already has a referral to [facility] placed earlier this month. I have placed it again.

## 2014-11-04 NOTE — Telephone Encounter (Signed)
Can you please order this for pt?

## 2014-11-04 NOTE — Telephone Encounter (Signed)
I looked through my referrals that I printed out & found the original referral. I faxed it on 10/20/14. I faxed the same one today and added a cover sheet with another note to please contact the patient for an appt. Maybe they will be able to get the patient on the phone.

## 2014-11-04 NOTE — Telephone Encounter (Signed)
Patient is requesting a referral to be entered in for psychiatry to [facility]

## 2014-11-05 ENCOUNTER — Ambulatory Visit: Payer: BC Managed Care – PPO | Admitting: Gastroenterology

## 2014-11-17 ENCOUNTER — Telehealth: Payer: Self-pay | Admitting: Certified Nurse Midwife

## 2014-11-17 NOTE — Telephone Encounter (Signed)
Spoke with patient. Patient states that she received a letter in the mail stating that her 3D mammogram was not going to be covered because the diagnosis was "neoplasm of the breast." Patient is concerned that something may be wrong with her mammogram results. Advised patient per review of results from 10/23/2014 there are no signs of malignancy. Needs to have screening mammogram again in one year. Advised this was reviewed by [name] CNM before being placed in our system. Patient is agreeable. She will contact her insurance company and [employer] to discuss further billing questions if needed.  Routing to provider for final review. Patient agreeable to disposition. Will close encounter.

## 2014-11-17 NOTE — Telephone Encounter (Signed)
Patient called and left a message after hours requesting more information about a recent mammogram.

## 2014-11-17 NOTE — Telephone Encounter (Signed)
Left message to call Kaitlyn at [phone]. 

## 2014-11-24 ENCOUNTER — Telehealth: Payer: Self-pay | Admitting: Certified Nurse Midwife

## 2014-11-24 NOTE — Telephone Encounter (Signed)
Left message that appointment for 12/09/2014 has been cancelled due to provider off and for patient to call our office to reschedule.

## 2014-11-30 ENCOUNTER — Ambulatory Visit (HOSPITAL_COMMUNITY): Payer: BC Managed Care – PPO | Admitting: Psychiatry

## 2014-12-04 ENCOUNTER — Emergency Department (HOSPITAL_COMMUNITY): Payer: BC Managed Care – PPO

## 2014-12-04 ENCOUNTER — Encounter (HOSPITAL_COMMUNITY): Payer: Self-pay | Admitting: Emergency Medicine

## 2014-12-04 ENCOUNTER — Emergency Department (HOSPITAL_COMMUNITY)
Admission: EM | Admit: 2014-12-04 | Discharge: 2014-12-04 | Disposition: A | Discharge status: Home / Self Care | Payer: BC Managed Care – PPO | Attending: Emergency Medicine | Admitting: Emergency Medicine

## 2014-12-04 DIAGNOSIS — Y998 Other external cause status: Secondary | ICD-10-CM | POA: Insufficient documentation

## 2014-12-04 DIAGNOSIS — Z8719 Personal history of other diseases of the digestive system: Secondary | ICD-10-CM | POA: Insufficient documentation

## 2014-12-04 DIAGNOSIS — F419 Anxiety disorder, unspecified: Secondary | ICD-10-CM | POA: Insufficient documentation

## 2014-12-04 DIAGNOSIS — Z8639 Personal history of other endocrine, nutritional and metabolic disease: Secondary | ICD-10-CM | POA: Diagnosis not present

## 2014-12-04 DIAGNOSIS — Y9389 Activity, other specified: Secondary | ICD-10-CM | POA: Diagnosis not present

## 2014-12-04 DIAGNOSIS — R059 Cough, unspecified: Secondary | ICD-10-CM

## 2014-12-04 DIAGNOSIS — S52501A Unspecified fracture of the lower end of right radius, initial encounter for closed fracture: Secondary | ICD-10-CM | POA: Diagnosis not present

## 2014-12-04 DIAGNOSIS — F329 Major depressive disorder, single episode, unspecified: Secondary | ICD-10-CM | POA: Diagnosis not present

## 2014-12-04 DIAGNOSIS — I10 Essential (primary) hypertension: Secondary | ICD-10-CM | POA: Diagnosis not present

## 2014-12-04 DIAGNOSIS — R05 Cough: Secondary | ICD-10-CM | POA: Diagnosis not present

## 2014-12-04 DIAGNOSIS — Z8701 Personal history of pneumonia (recurrent): Secondary | ICD-10-CM | POA: Insufficient documentation

## 2014-12-04 DIAGNOSIS — S6991XA Unspecified injury of right wrist, hand and finger(s), initial encounter: Secondary | ICD-10-CM | POA: Diagnosis present

## 2014-12-04 DIAGNOSIS — Z79899 Other long term (current) drug therapy: Secondary | ICD-10-CM | POA: Insufficient documentation

## 2014-12-04 DIAGNOSIS — Y9289 Other specified places as the place of occurrence of the external cause: Secondary | ICD-10-CM | POA: Diagnosis not present

## 2014-12-04 DIAGNOSIS — W010XXA Fall on same level from slipping, tripping and stumbling without subsequent striking against object, initial encounter: Secondary | ICD-10-CM | POA: Diagnosis not present

## 2014-12-04 DIAGNOSIS — M1990 Unspecified osteoarthritis, unspecified site: Secondary | ICD-10-CM | POA: Diagnosis not present

## 2014-12-04 DIAGNOSIS — G43909 Migraine, unspecified, not intractable, without status migrainosus: Secondary | ICD-10-CM | POA: Diagnosis not present

## 2014-12-04 NOTE — ED Notes (Signed)
Ortho called 

## 2014-12-04 NOTE — ED Notes (Signed)
Per ot, states she slipped on leaves last night injuring right wrist-states chronic bronchitis and needs some leviquin

## 2014-12-04 NOTE — Discharge Instructions (Signed)
Take tylenol, motrin for pain.  Apply ice on it.  Wear splint at all times.  See Dr. Ninfa Linden to get repeat xray in a week.   Return to ER if you have worse swelling, pain, unable to move fingers, fingers turning blue.

## 2014-12-04 NOTE — ED Provider Notes (Signed)
CSN: [id number]     Arrival date & time 12/04/14  6010 History   First MD Initiated Contact with Patient 12/04/14 0750     Chief Complaint  Patient presents with  . Wrist Pain  . Bronchitis     (Consider location/radiation/quality/duration/timing/severity/associated sxs/prior Treatment) The history is provided by the patient.  Tiffany Horn is a 54 y.o. female hx of HTN, arthritis here with cough, fall. Patient states that she slipped on some leaves and fell on outstretched hand on the right. Has right wrist pain afterwards. Denies any other injuries or head injuries. States that she has been coughing since yesterday. She was concerned that she may have some bronchitis and was requesting Levaquin but denies any fevers and denies any smoking history.    Past Medical History  Diagnosis Date  . Migraines   . Depression   . Anxiety   . Hypertension     not taking any meds at present  . Arthritis     knees and spine  . Hx of irritable bowel syndrome     x2  . Complication of anesthesia     waking up is not easy  . PONV (postoperative nausea and vomiting)   . H/O miscarriage, not currently pregnant   . Pneumonia     4 episodes; hosp. admission 2014  . Bronchitis   . Hypokalemia     with PNA admission (2.5)   Past Surgical History  Procedure Laterality Date  . Tonsillectomy and adenoidectomy    . Laparoscopic abdominal exploration  1994    endometriosis  . Dilation and curettage of uterus    . Breast surgery      implants, then had them removed  . Orif humerus fracture Left 04/01/2013    DR Ninfa Linden  . Orif humerus fracture Left 04/01/2013    Procedure: OPEN REDUCTION INTERNAL FIXATION (ORIF) LEFT PROXIMAL HUMERUS FRACTURE;  Surgeon: Mcarthur Rossetti, MD;  Location: [facility];  Service: Orthopedics;  Laterality: Left;   Family History  Problem Relation Age of Onset  . Breast cancer Mother   . Diabetes Mother   . Cancer Father     colon  . Diabetes Father   . Dementia  Father   . Stroke Paternal Grandmother   . Diabetes Paternal Grandmother   . Colon cancer Maternal Uncle    Social History  Substance Use Topics  . Smoking status: Passive Smoke Exposure - Never Smoker  . Smokeless tobacco: Never Used     Comment: as a very young child  . Alcohol Use: 1.2 - 1.8 oz/week    2-3 Cans of beer per week     Comment: 1-2 beers a week   OB History    Gravida Para Term Preterm AB TAB SAB Ectopic Multiple Living   2 0 0 0 2  2   0     Review of Systems  Respiratory: Positive for cough.   Musculoskeletal:       R wrist pain   All other systems reviewed and are negative.     Allergies  Review of patient's allergies indicates no known allergies.  Home Medications   Prior to Admission medications   Medication Sig Start Date End Date Taking? Authorizing Provider  buPROPion (WELLBUTRIN XL) 300 MG 24 hr tablet Take 1 tablet (300 mg total) by mouth daily. 09/01/14   Renee A Kuneff, DO  clonazePAM (KLONOPIN) 1 MG tablet Take 1 tablet (1 mg total) by mouth 3 (three) times daily  as needed. 10/02/14   Renee A Kuneff, DO  ibuprofen (ADVIL,MOTRIN) 800 MG tablet Take 800 mg by mouth every 8 (eight) hours as needed for moderate pain.    Historical Provider, MD  levothyroxine (SYNTHROID, LEVOTHROID) 88 MCG tablet Take 1 tablet (88 mcg total) by mouth daily. 10/20/14   Renee A Kuneff, DO  sertraline (ZOLOFT) 100 MG tablet Take 2 tablets (200 mg total) by mouth daily. 09/01/14   Renee A Kuneff, DO  vitamin B-12 (CYANOCOBALAMIN) 1000 MCG tablet 2000 mcg QD for 2 weeks, then 1000 mcg daily. 10/20/14   Renee A Kuneff, DO  Vitamin D, Ergocalciferol, (DRISDOL) 50000 UNITS CAPS capsule Take 1 capsule (50,000 Units total) by mouth every 7 (seven) days. 10/20/14   Renee A Kuneff, DO   BP 133/87 mmHg  Pulse 76  Temp(Src) 97.5 F (36.4 C) (Oral)  Resp 18  SpO2 100%  LMP 01/31/2007 Physical Exam  Constitutional: She is oriented to person, place, and time. She appears  well-developed.  HENT:  Head: Normocephalic.  Mouth/Throat: Oropharynx is clear and moist.  Eyes: Conjunctivae are normal. Pupils are equal, round, and reactive to light.  Neck: Normal range of motion. Neck supple. No thyromegaly present.  Cardiovascular: Normal rate, regular rhythm and normal heart sounds.   Pulmonary/Chest: Effort normal and breath sounds normal. No respiratory distress. She has no wheezes. She has no rales.  Abdominal: Soft. She exhibits no distension. There is no tenderness. There is no rebound.  Musculoskeletal: Normal range of motion.  R wrist mildly tender, 2+ pulses, nl capillary refill   Neurological: She is alert and oriented to person, place, and time.  Skin: Skin is warm and dry.  Psychiatric: She has a normal mood and affect. Her behavior is normal. Judgment and thought content normal.  Nursing note and vitals reviewed.   ED Course  Procedures (including critical care time) Labs Review Labs Reviewed - No data to display  Imaging Review Dg Chest 2 View  12/04/2014  CLINICAL DATA:  Cough, congestion and mid chest pain. EXAM: CHEST - 2 VIEW COMPARISON:  08/26/2014 FINDINGS: The heart size and mediastinal contours are within normal limits. There is no evidence of pulmonary edema, consolidation, pneumothorax, nodule or pleural fluid. The visualized skeletal structures are unremarkable. IMPRESSION: No active disease. Electronically Signed   By: Aletta Edouard M.D.   On: 12/04/2014 08:01   Dg Wrist Complete Right  12/04/2014  CLINICAL DATA:  Pain following fall EXAM: RIGHT WRIST - COMPLETE 3+ VIEW COMPARISON:  None. FINDINGS: Frontal, oblique, lateral, and ulnar deviation scaphoid images were obtained. There is an impacted fracture of the distal radial metaphysis with alignment essentially anatomic. No other fracture. No dislocation. Joint spaces appear intact. No erosive change. IMPRESSION: Impaction type fracture distal radial metaphysis with alignment essentially  anatomic. No other fracture. No dislocation. Electronically Signed   By: Lowella Grip III M.D.   On: 12/04/2014 07:57   I have personally reviewed and evaluated these images and lab results as part of my medical decision-making.   EKG Interpretation None      MDM   Final diagnoses:  None   Daveda Larock is a 54 y.o. female here with cough, R wrist injury. No wheezing on lung exam, afebrile, I think likely URI vs mild bronchitis. CXR showed no pneumonia. Xray R wrist showed small distal radius fracture with good alignment. Given sugar tongue splint. Has seen Dr. Ninfa Linden in the past, will have her get repeat xrays in the office  in a week.     Wandra Arthurs, MD 12/04/14 [phone]

## 2014-12-09 ENCOUNTER — Ambulatory Visit: Payer: BC Managed Care – PPO | Admitting: Certified Nurse Midwife

## 2014-12-23 ENCOUNTER — Ambulatory Visit: Payer: BC Managed Care – PPO | Admitting: Certified Nurse Midwife

## 2015-01-08 ENCOUNTER — Telehealth: Payer: Self-pay | Admitting: Family Medicine

## 2015-01-08 NOTE — Telephone Encounter (Signed)
FYI - Pt called to let Dr. Raliegh Ip know that she has lost 23 lbs. She says it is all stress related due to a family situation she will tell Dr. Raliegh Ip about at a later date./dh

## 2015-01-21 ENCOUNTER — Ambulatory Visit: Payer: BC Managed Care – PPO | Admitting: Certified Nurse Midwife

## 2015-02-02 ENCOUNTER — Other Ambulatory Visit: Payer: Self-pay | Admitting: *Deleted

## 2015-02-02 MED ORDER — SERTRALINE HCL 100 MG PO TABS: 200.0000 mg | ORAL_TABLET | Freq: Every day | ORAL | Status: DC

## 2015-02-03 ENCOUNTER — Other Ambulatory Visit: Payer: Self-pay | Admitting: *Deleted

## 2015-02-15 ENCOUNTER — Telehealth: Payer: Self-pay | Admitting: Gastroenterology

## 2015-02-15 ENCOUNTER — Ambulatory Visit (INDEPENDENT_AMBULATORY_CARE_PROVIDER_SITE_OTHER): Payer: BC Managed Care – PPO | Admitting: Family Medicine

## 2015-02-15 ENCOUNTER — Telehealth: Payer: Self-pay | Admitting: Family Medicine

## 2015-02-15 ENCOUNTER — Encounter: Payer: Self-pay | Admitting: Family Medicine

## 2015-02-15 VITALS — BP 137/90 | HR 73 | Temp 98.1°F | Resp 20 | Wt 161.8 lb

## 2015-02-15 DIAGNOSIS — E559 Vitamin D deficiency, unspecified: Secondary | ICD-10-CM | POA: Diagnosis not present

## 2015-02-15 DIAGNOSIS — E538 Deficiency of other specified B group vitamins: Secondary | ICD-10-CM

## 2015-02-15 DIAGNOSIS — Z1211 Encounter for screening for malignant neoplasm of colon: Secondary | ICD-10-CM | POA: Diagnosis not present

## 2015-02-15 DIAGNOSIS — E039 Hypothyroidism, unspecified: Secondary | ICD-10-CM

## 2015-02-15 DIAGNOSIS — G43909 Migraine, unspecified, not intractable, without status migrainosus: Secondary | ICD-10-CM | POA: Insufficient documentation

## 2015-02-15 LAB — CBC WITH DIFFERENTIAL/PLATELET
Basophils Absolute: 0 10*3/uL (ref 0.0–0.1)
Basophils Relative: 0.8 % (ref 0.0–3.0)
Eosinophils Absolute: 0.1 10*3/uL (ref 0.0–0.7)
Eosinophils Relative: 2.2 % (ref 0.0–5.0)
HCT: 44.2 % (ref 36.0–46.0)
Hemoglobin: 14.7 g/dL (ref 12.0–15.0)
Lymphocytes Relative: 33.6 % (ref 12.0–46.0)
Lymphs Abs: 2.1 10*3/uL (ref 0.7–4.0)
MCHC: 33.3 g/dL (ref 30.0–36.0)
MCV: 88.1 fl (ref 78.0–100.0)
Monocytes Absolute: 0.4 10*3/uL (ref 0.1–1.0)
Monocytes Relative: 7.2 % (ref 3.0–12.0)
Neutro Abs: 3.4 10*3/uL (ref 1.4–7.7)
Neutrophils Relative %: 56.2 % (ref 43.0–77.0)
Platelets: 212 10*3/uL (ref 150.0–400.0)
RBC: 5.01 Mil/uL (ref 3.87–5.11)
RDW: 13.5 % (ref 11.5–15.5)
WBC: 6.1 10*3/uL (ref 4.0–10.5)

## 2015-02-15 LAB — VITAMIN D 25 HYDROXY (VIT D DEFICIENCY, FRACTURES): VITD: 21.38 ng/mL — ABNORMAL LOW (ref 30.00–100.00)

## 2015-02-15 LAB — VITAMIN B12: Vitamin B-12: 303 pg/mL (ref 211–911)

## 2015-02-15 MED ORDER — CHOLECALCIFEROL 1.25 MG (50000 UT) PO CAPS: 50000.0000 [IU] | ORAL_CAPSULE | ORAL | Status: DC

## 2015-02-15 MED ORDER — RIZATRIPTAN BENZOATE 5 MG PO TABS: 5.0000 mg | ORAL_TABLET | ORAL | Status: DC | PRN

## 2015-02-15 MED ORDER — SERTRALINE HCL 100 MG PO TABS: 200.0000 mg | ORAL_TABLET | Freq: Every day | ORAL | Status: DC

## 2015-02-15 MED ORDER — VITAMIN B-12 1000 MCG PO TABS: ORAL_TABLET | ORAL | Status: DC

## 2015-02-15 MED ORDER — LEVOTHYROXINE SODIUM 88 MCG PO TABS: 88.0000 ug | ORAL_TABLET | Freq: Every day | ORAL | Status: DC

## 2015-02-15 MED ORDER — CLONAZEPAM 1 MG PO TABS: 1.0000 mg | ORAL_TABLET | Freq: Three times a day (TID) | ORAL | Status: DC | PRN

## 2015-02-15 MED ORDER — BUPROPION HCL ER (XL) 300 MG PO TB24: 300.0000 mg | ORAL_TABLET | Freq: Every day | ORAL | Status: DC

## 2015-02-15 NOTE — Telephone Encounter (Signed)
Received GI records from [facility] and placed on Dr. Woodward Ku desk for review. Patient is requesting a female Tax adviser.

## 2015-02-15 NOTE — Patient Instructions (Signed)
I believe you are having weight loss from stress.  I will collect levels for vit d, b12 and check a cbc (blood work). We will call you with the results once available.  If your diarrhea continues, I will need to see yo back concerning that.I have also placed a GI referral for a colonoscopy and GI issues. With your family history this should be monitored closely, especially when having changes.   Eat at least 3 times a day, even if two of the meals are small. At least one full meal.

## 2015-02-15 NOTE — Telephone Encounter (Signed)
Please call pt: - Her b12 and Vit D are still low. I have called in prescribed doses for her to continue for 12 weeks, then re-test. She should continue the daily OTC supplement, regardless after finishing prescribed supplement.

## 2015-02-15 NOTE — Progress Notes (Signed)
Subjective:    Patient ID: Tiffany Horn, female    DOB: 12/25/1960, 55 y.o.   MRN: [id number]  HPI   Fatigue/Weight loss: Patient presents for routine office visit with complaints of fatigue and weight loss. Patient states she feels that this is a stress reaction, she has lost ~19 lbs since her office visit in September. Patient states this is unintentional weight loss, she states she is skipping many meals secondary to taking care of her mother. Her mother recently had a fall, and fractured her hip, which is requiring daily assistance. Patient states she has a 24-7 home care in place, but she does have to go down and check in on her mother. Is approximately an hour and a half drive away one direction. She is spending weekends at her mothers. SHe is also responsible for all of her mother's appointments/care/pills etc. She feels her mother's dementia is worsening, and will be in need of even more care shortly. She also has a few dogs of her home which are in the process of dying. She does admit to skipping many meals. She states when she does eat she sometimes has diarrhea. She reports having similar symptoms in the past secondary to stress. She also has a family history of colon cancer in her father. She has had early colonoscopies without any abnormal colonoscopies per patient. Last colonoscopy was completed in [place], I do not have records of these today.  Depression: Patient states that she is actually doing better from her depression/anxiety standpoint. She is now seen a grief counselor which she feels has helped a great deal. She has no stretched out her social circle and has made a few new friends which she tries to meet with weekly. She now is reading more proximal, and trying to take time for herself. She still has problems sleeping, but this is secondary to the stress of taking care of her mother. SHe does take Klonopin and sometimes Advil PM at night to help her sleep. She continues to take  Wellbutrin, Klonopin and Zoloft as indicated.    Medication List       This list is accurate as of: 02/15/15 11:22 AM.  Always use your most recent med list.               buPROPion 300 MG 24 hr tablet  Commonly known as:  WELLBUTRIN XL  Take 1 tablet (300 mg total) by mouth daily.     clonazePAM 1 MG tablet  Commonly known as:  KLONOPIN  Take 1 tablet (1 mg total) by mouth 3 (three) times daily as needed.     ibuprofen 800 MG tablet  Commonly known as:  ADVIL,MOTRIN  Take 800 mg by mouth every 8 (eight) hours as needed for moderate pain.     levothyroxine 88 MCG tablet  Commonly known as:  SYNTHROID, LEVOTHROID  Take 1 tablet (88 mcg total) by mouth daily.     sertraline 100 MG tablet  Commonly known as:  ZOLOFT  Take 2 tablets (200 mg total) by mouth daily.     vitamin B-12 1000 MCG tablet  Commonly known as:  CYANOCOBALAMIN  2000 mcg QD for 2 weeks, then 1000 mcg daily.         Past Medical History  Diagnosis Date  . Migraines   . Depression   . Anxiety   . Hypertension     not taking any meds at present  . Arthritis     knees and  spine  . Hx of irritable bowel syndrome     x2  . Complication of anesthesia     waking up is not easy  . PONV (postoperative nausea and vomiting)   . H/O miscarriage, not currently pregnant   . Pneumonia     4 episodes; hosp. admission 2014  . Bronchitis   . Hypokalemia     with PNA admission (2.5)   No Known Allergies  Social History   Social History  . Marital Status: Divorced    Spouse Name: N/A  . Number of Children: 0  . Years of Education: N/A   Occupational History  . Control and instrumentation engineer    Social History Main Topics  . Smoking status: Passive Smoke Exposure - Never Smoker  . Smokeless tobacco: Never Used     Comment: as a very young child  . Alcohol Use: 1.2 - 1.8 oz/week    2-3 Cans of beer per week     Comment: 1-2 beers a week  . Drug Use: No  . Sexual Activity: No   Other Topics Concern  . Not  on file   Social History Narrative   She is originally from [facility] [place] [facility]. She has traveled to [place] [facility] [place], [place], [place], [place], [place], [place], [place], [place], [place]. No international travel. She has dogs. No prior bird, mold, or recent hot tub exposure. She hasn't used her hot tub in 1.5 years. 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 Previously enjoyed gardening and playing tennis. Helps to care for her mother.    Review of Systems Negative, with the exception of above mentioned in HPI     Objective:   Physical Exam BP 137/90 mmHg  Pulse 73  Temp(Src) 98.1 F (36.7 C) (Oral)  Resp 20  Wt 161 lb 12 oz (73.369 kg)  SpO2 97%  LMP 01/31/2007 Gen: Afebrile. No acute distress. Nontoxic in appearance, well-developed, well-nourished, Caucasian female. Appears well today. HENT: AT. [place].  MMM.  Eyes:Pupils Equal Round Reactive to light, Extraocular movements intact,  Conjunctiva without redness, discharge or icterus. Neck/lymp/endocrine: Supple, no lymphadenopathy, no thyromegaly CV: RRR no murmur, no edema, +2/4 P posterior tibialis pulses Chest: CTAB, no wheeze or crackles Abd: Soft. Round. NTND. BS present. No Masses palpated.  Neuro:Normal gait. PERLA. EOMi. Alert. Oriented x3  Psych: Normal affect, dress and demeanor. Tearful at times when discussing her mother. Normal speech. Normal thought content and judgment..     Assessment & Plan:  1. Hypothyroidism, unspecified hypothyroidism type - TSH in 8-12 weeks - refill provided today. Discussed with pt stopping medicine in the future is never to happen. Discussed the body's physical need for this hormone and repercussions of not having medicine.  - levothyroxine (SYNTHROID, LEVOTHROID) 88 MCG tablet; Take 1 tablet (88 mcg total) by mouth daily.  Dispense: 30 tablet; Refill: 2  2. Colon cancer screening - Diarrhea, weight loss and fhx of colon cancer. Although, weight loss is likely secondary to stress ans decrease  appetite, pt not eating meals, she  should be evaluated with her family history.  - Ambulatory referral to Gastroenterology  3. Vitamin D deficiency - Repeat level today, pt to take daily Vitamin D - Vitamin D (25 hydroxy)  4. B12 deficiency - B12 - CBC w/Diff  5. Migraine without status migrainosus, not intractable, unspecified migraine type - rizatriptan (MAXALT) 5 MG tablet; Take 1 tablet (5 mg total) by mouth as needed for migraine. May  repeat in 2 hours if needed  Dispense: 10 tablet; Refill: 0  6. Depression: - Continue Wellbutrin, klonopin, zoloft. Refills provided today.  - Continue with grief therapist. Continue with social activities. - follow up in 3-6 months.   7. Weight loss: pt has no other systemic concerning systems. Weight loss likely from skipping meals and acute stress. Pt does have family hx of colon cancer and should be evaluated with colonoscopy. She reports normal colonoscopy in the past, completed through [facility].  - Advised pt to make an effort to eat 3 meals a day, even if two of the meals are small. Continue Boost/Ensure use 2 times a day.  - Follow up in 3 months, unless weight loss continues or diarrhea continues, then would need to see sooner.   > 25 minutes spent with patient, >50% of time spent face to face counseling patient and coordinating care.

## 2015-02-16 NOTE — Telephone Encounter (Signed)
Spoke with patient reviewed lab results and instructions. Patient verbalized understanding of all instructions. 

## 2015-02-19 ENCOUNTER — Encounter: Payer: Self-pay | Admitting: Family Medicine

## 2015-02-19 ENCOUNTER — Encounter: Payer: Self-pay | Admitting: Gastroenterology

## 2015-02-19 NOTE — Telephone Encounter (Signed)
Dr. Nandigam reviewed records and has accepted patient. Ok to schedule Direct Colon. Colonoscopy scheduled. °

## 2015-03-05 ENCOUNTER — Encounter: Payer: Self-pay | Admitting: Gastroenterology

## 2015-03-11 ENCOUNTER — Ambulatory Visit (INDEPENDENT_AMBULATORY_CARE_PROVIDER_SITE_OTHER): Payer: BC Managed Care – PPO | Admitting: Certified Nurse Midwife

## 2015-03-11 ENCOUNTER — Encounter: Payer: Self-pay | Admitting: Certified Nurse Midwife

## 2015-03-11 VITALS — BP 118/80 | HR 70 | Resp 16 | Ht 65.5 in | Wt 162.0 lb

## 2015-03-11 DIAGNOSIS — Z01419 Encounter for gynecological examination (general) (routine) without abnormal findings: Secondary | ICD-10-CM | POA: Diagnosis not present

## 2015-03-11 DIAGNOSIS — Z0000 Encounter for general adult medical examination without abnormal findings: Secondary | ICD-10-CM

## 2015-03-11 DIAGNOSIS — Z124 Encounter for screening for malignant neoplasm of cervix: Secondary | ICD-10-CM | POA: Diagnosis not present

## 2015-03-11 LAB — POCT URINALYSIS DIPSTICK
Bilirubin, UA: NEGATIVE
Blood, UA: NEGATIVE
Glucose, UA: NEGATIVE
Ketones, UA: NEGATIVE
Leukocytes, UA: NEGATIVE
Nitrite, UA: NEGATIVE
Protein, UA: NEGATIVE
Urobilinogen, UA: NEGATIVE
pH, UA: 5

## 2015-03-11 NOTE — Progress Notes (Signed)
55 y.o. [id number] Divorced  Caucasian Fe here for annual exam. Menopausal no vaginal bleeding, no vaginal dryness. Sees PCP for aex/labs and medcation management of anxiety and depression/hypothyroid. Social stress with mother's hip and rib fractures and dementia. Patient working 90 hours a week and caring for mother. Brother out of town, limited assistance. Has counselor she will be seeing soon. Patient has lost 30 pounds in past year with stress and IBS. Has colonoscopy scheduled for evaluation. Trying to take one day at a time, but not eating well.  Recent shoulder surgery herself, healing well, under follow up. No other health issues today.  Patient's last menstrual period was 01/31/2007.          Sexually active: No.  The current method of family planning is menopausal.    Exercising: Yes.    walking during warm weather Smoker:  no  Health Maintenance: Pap: 11-11-13 neg MMG: 10-23-14 category b density,birads 1:neg Colonoscopy:  2007 f/u 75yrs, scheduled for next month BMD:   2007 TDaP:  6/12 Shingles: no Pneumonia: 2016 Hep C and HIV: 2016 neg for both Labs: poct urine-neg Self breast exam: done occ   reports that she has been passively smoking.  She has never used smokeless tobacco. She reports that she drinks about 0.6 - 1.2 oz of alcohol per week. She reports that she does not use illicit drugs.  Past Medical History  Diagnosis Date  . Migraines   . Depression   . Anxiety   . Hypertension     not taking any meds at present  . Arthritis     knees and spine  . Hx of irritable bowel syndrome     x2  . Complication of anesthesia     waking up is not easy  . PONV (postoperative nausea and vomiting)   . H/O miscarriage, not currently pregnant   . Pneumonia     4 episodes; hosp. admission 2014  . Bronchitis   . Hypokalemia     with PNA admission (2.5)    Past Surgical History  Procedure Laterality Date  . Tonsillectomy and adenoidectomy    . Laparoscopic abdominal  exploration  1994    endometriosis  . Dilation and curettage of uterus    . Breast surgery      implants, then had them removed  . Orif humerus fracture Left 04/01/2013    DR Ninfa Linden  . Orif humerus fracture Left 04/01/2013    Procedure: OPEN REDUCTION INTERNAL FIXATION (ORIF) LEFT PROXIMAL HUMERUS FRACTURE;  Surgeon: Mcarthur Rossetti, MD;  Location: [facility];  Service: Orthopedics;  Laterality: Left;    Current Outpatient Prescriptions  Medication Sig Dispense Refill  . buPROPion (WELLBUTRIN XL) 300 MG 24 hr tablet Take 1 tablet (300 mg total) by mouth daily. 30 tablet 3  . Cholecalciferol 50000 units capsule Take 1 capsule (50,000 Units total) by mouth 2 (two) times a week. 24 capsule 0  . clonazePAM (KLONOPIN) 1 MG tablet Take 1 tablet (1 mg total) by mouth 3 (three) times daily as needed. 90 tablet 1  . ibuprofen (ADVIL,MOTRIN) 800 MG tablet Take 800 mg by mouth every 8 (eight) hours as needed for moderate pain.    Marland Kitchen levothyroxine (SYNTHROID, LEVOTHROID) 88 MCG tablet Take 1 tablet (88 mcg total) by mouth daily. 30 tablet 2  . rizatriptan (MAXALT) 5 MG tablet Take 1 tablet (5 mg total) by mouth as needed for migraine. May repeat in 2 hours if needed 10 tablet 0  .  sertraline (ZOLOFT) 100 MG tablet Take 2 tablets (200 mg total) by mouth daily. 60 tablet 2  . vitamin B-12 (CYANOCOBALAMIN) 1000 MCG tablet 2000 mcg QD for 2 weeks, then 1000 mcg daily. 45 tablet 1  . predniSONE (DELTASONE) 20 MG tablet Reported on 03/11/2015    . [DISCONTINUED] Asenapine Maleate (SAPHRIS SL) Place under the tongue. 2 at bedtime     No current facility-administered medications for this visit.    Family History  Problem Relation Age of Onset  . Breast cancer Mother   . Diabetes Mother   . Cancer Father     colon  . Diabetes Father   . Dementia Father   . Stroke Paternal Grandmother   . Diabetes Paternal Grandmother   . Colon cancer Maternal Uncle     ROS:  Pertinent items are noted in HPI.   Otherwise, a comprehensive ROS was negative.  Exam:   BP 118/80 mmHg  Pulse 70  Resp 16  Ht 5' 5.5" (1.664 m)  Wt 162 lb (73.483 kg)  BMI 26.54 kg/m2  LMP 01/31/2007 Height: 5' 5.5" (166.4 cm) Ht Readings from Last 3 Encounters:  03/11/15 5' 5.5" (1.664 m)  10/19/14 5\' 6"  (1.676 m)  09/01/14 5\' 6"  (1.676 m)    General appearance: alert, cooperative and appears stated age Head: Normocephalic, without obvious abnormality, atraumatic Neck: no adenopathy, supple, symmetrical, trachea midline and thyroid normal to inspection and palpation Lungs: clear to auscultation bilaterally Breasts: normal appearance, no masses or tenderness, No nipple retraction or dimpling, No nipple discharge or bleeding, No axillary or supraclavicular adenopathy Heart: regular rate and rhythm Abdomen: soft, non-tender; no masses,  no organomegaly Extremities: extremities normal, atraumatic, no cyanosis or edema Skin: Skin color, texture, turgor normal. No rashes or lesions Lymph nodes: Cervical, supraclavicular, and axillary nodes normal. No abnormal inguinal nodes palpated Neurologic: Grossly normal   Pelvic: External genitalia:  no lesions              Urethra:  normal appearing urethra with no masses, tenderness or lesions              Bartholin's and Skene's: normal                 Vagina: dry appearing vagina with normal color and discharge, no lesions, scant moisture              Cervix: normal,non tender,no lesions              Pap taken: Yes.   Bimanual Exam:  Uterus:  normal size, contour, position, consistency, mobility, non-tender              Adnexa: normal adnexa and no mass, fullness, tenderness               Rectovaginal: Confirms               Anus:  normal sphincter tone, no lesions  Chaperone present: yes  A:  Well Woman with normal exam  Menopausal no HRT  Vaginal dryness noted on exam  Hypothyroid, anxiety/depression with PCP management  Social stress with mother's health status  change    P:   Reviewed health and wellness pertinent to exam  Aware of need to evaluate if vaginal bleeding  Discussed finding and coconut oil use for comfort. Instructions given.  Continue follow up as indicated with PCP  Discussed seeking care provider help outside family if possible, to give her some rest and relief. Patient has  started this. Discussed adequate food and fluid to keep her healthy. Suggested oral probiotic use to help with GI symptoms given information regarding. Questions addressed.  Pap smear as above taken with HPVHR   counseled on breast self exam, mammography screening, adequate intake of calcium and vitamin D, diet and exercise  return annually or prn  An After Visit Summary was printed and given to the patient.

## 2015-03-11 NOTE — Patient Instructions (Signed)

## 2015-03-12 ENCOUNTER — Telehealth: Payer: Self-pay | Admitting: Certified Nurse Midwife

## 2015-03-12 NOTE — Progress Notes (Signed)
Encounter reviewed Tiffany Kawa, MD   

## 2015-03-12 NOTE — Telephone Encounter (Signed)
Patient returning your call.

## 2015-03-12 NOTE — Telephone Encounter (Signed)
Spoke with patient. Advised I have rescheduled her appointment for her pre-op visit to 03/19/2015 at 4:30 pm and her colonoscopy for 03/25/2015 at 8:30 am both with [facility] off of [facility]. She is agreeable to both dates and times.  Routing to provider for final review. Patient agreeable to disposition. Will close encounter.

## 2015-03-12 NOTE — Telephone Encounter (Signed)
Patient is asking to talk to [name] nurse regarding her upcoming appointment.

## 2015-03-12 NOTE — Telephone Encounter (Signed)
Spoke with patient. Patient states that she called and scheduled her pre-op and her her colonoscopy. She is scheduled for her pre-op on 3/28 and her colonoscopy on 4/11 with [facility]. She is concerned about waiting this long due to recent weight loss and appetite changes. Asking if our office could call to move this appointment up. Advised I will contact [facility] to see what I can do to move appointment. She is agreeable.

## 2015-03-17 ENCOUNTER — Telehealth: Payer: Self-pay | Admitting: Certified Nurse Midwife

## 2015-03-17 LAB — IPS PAP TEST WITH HPV

## 2015-03-17 NOTE — Telephone Encounter (Signed)
Routing to [employer] CNM for review and advise of patient's pap smear results from 03/11/2015.

## 2015-03-17 NOTE — Telephone Encounter (Signed)
Patient calling to check on pap smear results.

## 2015-03-17 NOTE — Addendum Note (Signed)
Addended by: [name] on: 03/17/2015 12:03 PM   Modules accepted: Orders, SmartSet

## 2015-03-17 NOTE — Telephone Encounter (Signed)
Pap results are not completed. She has HPVHR + she is not aware and waiting on Genotype before discussing and giving results to patient.

## 2015-03-19 ENCOUNTER — Ambulatory Visit (AMBULATORY_SURGERY_CENTER): Payer: Self-pay | Admitting: *Deleted

## 2015-03-19 VITALS — Ht 66.0 in | Wt 164.6 lb

## 2015-03-19 DIAGNOSIS — Z1211 Encounter for screening for malignant neoplasm of colon: Secondary | ICD-10-CM

## 2015-03-19 LAB — IPS HPV GENOTYPING 16/18

## 2015-03-19 MED ORDER — SUPREP BOWEL PREP KIT 17.5-3.13-1.6 GM/177ML PO SOLN: 1.0000 | Freq: Once | ORAL | Status: DC

## 2015-03-19 NOTE — Telephone Encounter (Signed)
Patient left a message on our voicemail in regards to pap results she said she is getting anxious. Best # to reach: [phone]

## 2015-03-19 NOTE — Telephone Encounter (Signed)
Routing to [employer]. Please advise.

## 2015-03-19 NOTE — Progress Notes (Signed)
Patient denies any allergies to egg or soy products. Patient has PONV and is low to wake up with anesthesia.  Patient denies oxygen use at home and denies diet medications. Emmi instructions for colonoscopy explained but patient denied.

## 2015-03-19 NOTE — Telephone Encounter (Signed)
Patient notified by Caryl Asp this am.

## 2015-03-22 ENCOUNTER — Telehealth: Payer: Self-pay | Admitting: Certified Nurse Midwife

## 2015-03-22 NOTE — Telephone Encounter (Signed)
Patient has some follow up questions to her conversation with Joy from last week about results.  Please see phone note.  Patient aware triage nurse may return her call.

## 2015-03-22 NOTE — Telephone Encounter (Signed)
Return call to patient. She reports it is not a good time to talk. She will call back at her convenience.

## 2015-03-23 NOTE — Telephone Encounter (Signed)
Spoke with patient and questions answered regarding HPV testing and results and subtyping. Discussed transmission, prevalence and immune response. Questions answered.  08 recall is in place and patient verbalized understanding of importance of follow up in one year for repeat pap testing.  Routing to provider for final review. Patient agreeable to disposition. Will close encounter.

## 2015-03-23 NOTE — Telephone Encounter (Signed)
Return call to Tracy. °

## 2015-03-25 ENCOUNTER — Encounter: Payer: Self-pay | Admitting: Gastroenterology

## 2015-03-25 ENCOUNTER — Ambulatory Visit (AMBULATORY_SURGERY_CENTER): Payer: BC Managed Care – PPO | Admitting: Gastroenterology

## 2015-03-25 VITALS — BP 115/75 | HR 79 | Temp 98.6°F | Resp 11 | Ht 66.0 in | Wt 164.0 lb

## 2015-03-25 DIAGNOSIS — Z1211 Encounter for screening for malignant neoplasm of colon: Secondary | ICD-10-CM

## 2015-03-25 DIAGNOSIS — Z800 Family history of malignant neoplasm of digestive organs: Secondary | ICD-10-CM | POA: Diagnosis not present

## 2015-03-25 DIAGNOSIS — R197 Diarrhea, unspecified: Secondary | ICD-10-CM | POA: Diagnosis present

## 2015-03-25 DIAGNOSIS — R634 Abnormal weight loss: Secondary | ICD-10-CM

## 2015-03-25 MED ORDER — SODIUM CHLORIDE 0.9 % IV SOLN: 500.0000 mL | INTRAVENOUS | Status: DC

## 2015-03-25 NOTE — Progress Notes (Signed)
  [facility] Post-op Note  Patient: Tiffany Horn  Procedure(s) Performed: colonoscopy  Patient Location: [facility] - Recovery Area  Anesthesia Type: Deep Sedation/Propofol  Level of Consciousness: awake, oriented and patient cooperative  Airway and Oxygen Therapy: Patient Spontanous Breathing  Post-op Pain: none  Post-op Assessment:  Post-op Vital signs reviewed, Patient's Cardiovascular Status Stable, Respiratory Function Stable, Patent Airway, No signs of Nausea or vomiting and Pain level controlled  Post-op Vital Signs: Reviewed and stable  Complications: No apparent anesthesia complications  Fiza Nation E 10:13 AM

## 2015-03-25 NOTE — Op Note (Signed)
[facility]  [address] [place], [address]   COLONOSCOPY PROCEDURE REPORT  PATIENT: Tiffany, Horn  MR#: [id number] BIRTHDATE: 1960/04/28 , 54  yrs. old GENDER: female ENDOSCOPIST: Harl Bowie, MD REFERRED [id number] Kuneff, DO PROCEDURE DATE:  03/25/2015 PROCEDURE:   Colonoscopy, screening and Colonoscopy with biopsy First Screening Colonoscopy - Avg.  risk and is 50 yrs.  old or older - No.  Prior Negative Screening - Now for repeat screening. Less than 10 yrs Prior Negative Screening - Now for repeat screening.  Above average risk  History of Adenoma - Now for follow-up colonoscopy & has been > or = to 3 yrs.  N/A  Polyps removed today? No Recommend repeat exam, <10 yrs? Yes high risk ASA CLASS:   Class II INDICATIONS:Screening for colonic neoplasia, Clinically significant diarrhea of unexplained origin, and FH Colon or Rectal Adenocarcinoma. MEDICATIONS: Propofol 300 mg IV  DESCRIPTION OF PROCEDURE:   After the risks benefits and alternatives of the procedure were thoroughly explained, informed consent was obtained.  The digital rectal exam revealed no abnormalities of the rectum.   The LB [id number]  endoscope was introduced through the anus and advanced to the terminal ileum which was intubated for a short distance. No adverse events experienced.   The quality of the prep was good.  The instrument was then slowly withdrawn as the colon was fully examined. Estimated blood loss is zero unless otherwise noted in this procedure report.   COLON FINDINGS: The examined terminal ileum appeared to be normal. A normal appearing cecum, ileocecal valve, and appendiceal orifice were identified.  The ascending, transverse, descending, sigmoid colon, and rectum appeared unremarkable.  Multiple biopsies were performed using cold forceps.  Retroflexed views revealed internal hemorrhoids. Slight irregularity noted at dentate line in the anal canal, random  biopsies were obtained to r/o dysplasia. The time to cecum = 9.7 Withdrawal time = 8.9   The scope was withdrawn and the procedure completed. COMPLICATIONS: There were no immediate complications.  ENDOSCOPIC IMPRESSION: 1.   The examined terminal ileum appeared to be normal 2.   Normal colonoscopy; multiple biopsies were performed using cold forceps 3. Slight irregularity of mucosa in the anal canal  RECOMMENDATIONS: 1.  Await pathology results 2.  Given your significant family history of colon cancer, you should have a repeat colonoscopy in 5 years  eSigned:  Harl Bowie, MD 03/25/2015 10:14 AM

## 2015-03-25 NOTE — Progress Notes (Signed)
Called to room to assist during endoscopic procedure.  Patient ID and intended procedure confirmed with present staff. Received instructions for my participation in the procedure from the performing physician.  

## 2015-03-25 NOTE — Patient Instructions (Signed)
YOU HAD AN ENDOSCOPIC PROCEDURE TODAY AT THE [facility]:   Refer to the procedure report that was given to you for any specific questions about what was found during the examination.  If the procedure report does not answer your questions, please call your gastroenterologist to clarify.  If you requested that your care partner not be given the details of your procedure findings, then the procedure report has been included in a sealed envelope for you to review at your convenience later.  YOU SHOULD EXPECT: Some feelings of bloating in the abdomen. Passage of more gas than usual.  Walking can help get rid of the air that was put into your GI tract during the procedure and reduce the bloating. If you had a lower endoscopy (such as a colonoscopy or flexible sigmoidoscopy) you may notice spotting of blood in your stool or on the toilet paper. If you underwent a bowel prep for your procedure, you may not have a normal bowel movement for a few days.  Please Note:  You might notice some irritation and congestion in your nose or some drainage.  This is from the oxygen used during your procedure.  There is no need for concern and it should clear up in a day or so.  SYMPTOMS TO REPORT IMMEDIATELY:   Following lower endoscopy (colonoscopy or flexible sigmoidoscopy):  Excessive amounts of blood in the stool  Significant tenderness or worsening of abdominal pains  Swelling of the abdomen that is new, acute  Fever of 100F or higher  For urgent or emergent issues, a gastroenterologist can be reached at any hour by calling [phone].   DIET: Your first meal following the procedure should be a small meal and then it is ok to progress to your normal diet. Heavy or fried foods are harder to digest and may make you feel nauseous or bloated.  Likewise, meals heavy in dairy and vegetables can increase bloating.  Drink plenty of fluids but you should avoid alcoholic beverages for 24  hours.  ACTIVITY:  You should plan to take it easy for the rest of today and you should NOT DRIVE or use heavy machinery until tomorrow (because of the sedation medicines used during the test).    FOLLOW UP: Our staff will call the number listed on your records the next business day following your procedure to check on you and address any questions or concerns that you may have regarding the information given to you following your procedure. If we do not reach you, we will leave a message.  However, if you are feeling well and you are not experiencing any problems, there is no need to return our call.  We will assume that you have returned to your regular daily activities without incident.  If any biopsies were taken you will be contacted by phone or by letter within the next 1-3 weeks.  Please call us at [phone] if you have not heard about the biopsies in 3 weeks.    SIGNATURES/CONFIDENTIALITY: You and/or your care partner have signed paperwork which will be entered into your electronic medical record.  These signatures attest to the fact that that the information above on your After Visit Summary has been reviewed and is understood.  Full responsibility of the confidentiality of this discharge information lies with you and/or your care-partner.  Await biopsy results. 

## 2015-03-26 ENCOUNTER — Encounter: Payer: BC Managed Care – PPO | Admitting: Gastroenterology

## 2015-03-26 ENCOUNTER — Telehealth: Payer: Self-pay | Admitting: *Deleted

## 2015-03-26 NOTE — Telephone Encounter (Signed)
  Follow up Call-  Call back number 03/25/2015  Post procedure Call Back phone  # [phone]  Permission to leave phone message Yes     Patient questions:  Do you have a fever, pain , or abdominal swelling? No. Pain Score  0 *  Have you tolerated food without any problems? Yes.    Have you been able to return to your normal activities? Yes.    Do you have any questions about your discharge instructions: Diet   No. Medications  No. Follow up visit  No.  Do you have questions or concerns about your Care? No.  Actions: * If pain score is 4 or above: No action needed, pain <4.

## 2015-03-29 ENCOUNTER — Telehealth: Payer: Self-pay | Admitting: Gastroenterology

## 2015-03-29 ENCOUNTER — Telehealth: Payer: Self-pay | Admitting: Family Medicine

## 2015-03-29 NOTE — Telephone Encounter (Signed)
Spoke with patient let her know she has to get results from [facility] since they did procedure.

## 2015-03-29 NOTE — Telephone Encounter (Signed)
I have left message for the patient to call back. There are not any results back yet from her procedure done on 03/25/15.

## 2015-03-29 NOTE — Telephone Encounter (Signed)
Patient would like her colonoscopy results please. States she had biopsies and is concerned.

## 2015-03-30 ENCOUNTER — Encounter: Payer: Self-pay | Admitting: Gastroenterology

## 2015-03-31 ENCOUNTER — Telehealth: Payer: Self-pay | Admitting: Gastroenterology

## 2015-03-31 ENCOUNTER — Telehealth: Payer: Self-pay | Admitting: Family Medicine

## 2015-03-31 NOTE — Telephone Encounter (Signed)
Advised the patient her biopsy results are not reviewed by the provider. Assured her the biopsy say "no malignancy identified" because this was her concern

## 2015-03-31 NOTE — Telephone Encounter (Signed)
Patient requesting referral to [facility]. She does not have a preference for which location, she just wants an appt ASAP.

## 2015-03-31 NOTE — Telephone Encounter (Signed)
Faxed signed referral to [facility]

## 2015-04-06 ENCOUNTER — Telehealth: Payer: Self-pay | Admitting: Family Medicine

## 2015-04-06 MED ORDER — BUPROPION HCL ER (XL) 300 MG PO TB24: 300.0000 mg | ORAL_TABLET | Freq: Every day | ORAL | Status: DC

## 2015-04-06 NOTE — Telephone Encounter (Signed)
Rx sent to patient pharmacy. Left message for patient on voice mail if she gets worse she can go to [facility] ED for evaluation we do not do direct admits for [employer].

## 2015-04-06 NOTE — Telephone Encounter (Signed)
Wellbutrin [facility]. Patient is requesting CB to get info about doing an ER admit for depression if things get worse. She has made an appt at [facility] however their first opening is in April. Please call

## 2015-04-12 ENCOUNTER — Ambulatory Visit: Payer: BC Managed Care – PPO | Admitting: Family Medicine

## 2015-05-11 ENCOUNTER — Encounter: Payer: BC Managed Care – PPO | Admitting: Gastroenterology

## 2015-05-13 ENCOUNTER — Ambulatory Visit (HOSPITAL_COMMUNITY): Payer: BC Managed Care – PPO | Admitting: Psychiatry

## 2015-05-27 ENCOUNTER — Ambulatory Visit (INDEPENDENT_AMBULATORY_CARE_PROVIDER_SITE_OTHER): Payer: BC Managed Care – PPO | Admitting: Family Medicine

## 2015-05-27 ENCOUNTER — Telehealth: Payer: Self-pay | Admitting: *Deleted

## 2015-05-27 ENCOUNTER — Encounter: Payer: Self-pay | Admitting: Family Medicine

## 2015-05-27 VITALS — BP 134/92 | HR 85 | Temp 97.5°F | Resp 20 | Wt 162.5 lb

## 2015-05-27 DIAGNOSIS — J209 Acute bronchitis, unspecified: Secondary | ICD-10-CM

## 2015-05-27 DIAGNOSIS — E039 Hypothyroidism, unspecified: Secondary | ICD-10-CM

## 2015-05-27 LAB — TSH: TSH: 5.87 u[IU]/mL — ABNORMAL HIGH (ref 0.35–4.50)

## 2015-05-27 MED ORDER — DOXYCYCLINE HYCLATE 100 MG PO TABS: 100.0000 mg | ORAL_TABLET | Freq: Two times a day (BID) | ORAL | Status: DC

## 2015-05-27 MED ORDER — HYDROCODONE-HOMATROPINE 5-1.5 MG/5ML PO SYRP: 5.0000 mL | ORAL_SOLUTION | Freq: Three times a day (TID) | ORAL | Status: DC | PRN

## 2015-05-27 MED ORDER — BENZONATATE 200 MG PO CAPS: 200.0000 mg | ORAL_CAPSULE | Freq: Two times a day (BID) | ORAL | Status: DC | PRN

## 2015-05-27 NOTE — Telephone Encounter (Signed)
Patient left message stating she thinks she has bronchitis. She wants [place] to call in antibiotic and cough syrup for her. She states a Dr in [place] Rx'd augmentin for her but it is upsetting her stomach. After speaking with Dr Raoul Pitch patient need office visit for evaluation. Notified patient scheduled appt for today.

## 2015-05-27 NOTE — Progress Notes (Signed)
Patient ID: Tiffany Horn, female   DOB: 1960-09-12, 55 y.o.   MRN: [id number]    Tiffany Horn , 04-18-1960, 55 y.o., female MRN: [id number]  CC: cough Subjective: Pt presents for an acute OV with complaints of cough of 5 days duration. Associated symptoms include fatigue, productive cough, chest tightness, fever, chills. Pt denies nausea or vomit but has a decreased appetite.  Pt has tried Augmentin (From [facility] in [place]- Monday) to ease their symptoms. States she is not improving  at all. She feels prescription is making her sick to her stomach. She is still feeling well and feeling worse. She also is taking old cough syrup- with codeine.  Influenza and tdap UTD. No lung disease. She has a h/o bronchitis  A few times a year.  No Known Allergies Social History  Substance Use Topics  . Smoking status: Passive Smoke Exposure - Never Smoker  . Smokeless tobacco: Never Used     Comment: as a very young child  . Alcohol Use: 0.6 - 1.2 oz/week    1-2 Glasses of wine per week   Past Medical History  Diagnosis Date  . Migraines   . Depression   . Anxiety   . Hx of irritable bowel syndrome     x2  . Complication of anesthesia     waking up is not easy  . PONV (postoperative nausea and vomiting)   . H/O miscarriage, not currently pregnant   . Pneumonia     4 episodes; hosp. admission 2014  . Bronchitis     hx - recurrent  . Hypokalemia     with [facility] admission (2.5)  . Thyroid disease   . Arthritis     knees and spine, shoulder  . Hypertension     not taking any meds at present - under control per patient  . Hyperlipidemia     diet controlled - no medication   Past Surgical History  Procedure Laterality Date  . Tonsillectomy and adenoidectomy    . Laparoscopic abdominal exploration  1994    endometriosis  . Dilation and curettage of uterus    . Breast surgery      implants, then had them removed  . Orif humerus fracture Left 04/01/2013    [facility] - shoulder  . Orif humerus  fracture Left 04/01/2013    Procedure: OPEN REDUCTION INTERNAL FIXATION (ORIF) LEFT PROXIMAL HUMERUS FRACTURE;  Surgeon: Mcarthur Rossetti, MD;  Location: [facility];  Service: Orthopedics;  Laterality: Left;  . Colonoscopy      greater 10 yrs ago - ? [facility]   Family History  Problem Relation Age of Onset  . Breast cancer Mother   . Diabetes Mother   . Cancer Father     colon  . Diabetes Father   . Dementia Father   . Colon cancer Father   . Stroke Paternal Grandmother   . Diabetes Paternal Grandmother   . Colon polyps Neg Hx   . Esophageal cancer Neg Hx   . Rectal cancer Neg Hx   . Stomach cancer Neg Hx      Medication List       This list is accurate as of: 05/27/15 10:44 AM.  Always use your most recent med list.               amoxicillin-clavulanate 875-125 MG tablet  Commonly known as:  AUGMENTIN  Take 1 tablet by mouth 2 (two) times daily.     buPROPion 300 MG  24 hr tablet  Commonly known as:  WELLBUTRIN XL  Take 1 tablet (300 mg total) by mouth daily.     Cholecalciferol 50000 units capsule  Take 1 capsule (50,000 Units total) by mouth 2 (two) times a week.     clonazePAM 1 MG tablet  Commonly known as:  KLONOPIN  Take 1 tablet (1 mg total) by mouth 3 (three) times daily as needed.     ibuprofen 800 MG tablet  Commonly known as:  ADVIL,MOTRIN  Take 800 mg by mouth every 8 (eight) hours as needed for moderate pain.     levothyroxine 88 MCG tablet  Commonly known as:  SYNTHROID, LEVOTHROID  Take 1 tablet (88 mcg total) by mouth daily.     rizatriptan 5 MG tablet  Commonly known as:  MAXALT  Take 1 tablet (5 mg total) by mouth as needed for migraine. May repeat in 2 hours if needed     sertraline 100 MG tablet  Commonly known as:  ZOLOFT  Take 2 tablets (200 mg total) by mouth daily.     vitamin B-12 1000 MCG tablet  Commonly known as:  CYANOCOBALAMIN  2000 mcg QD for 2 weeks, then 1000 mcg daily.       ROS: Negative, with the exception  of above mentioned in HPI  Objective:  BP 134/92 mmHg  Pulse 85  Temp(Src) 97.5 F (36.4 C) (Oral)  Resp 20  Wt 162 lb 8 oz (73.71 kg)  SpO2 96%  LMP 01/31/2007 Body mass index is 26.24 kg/(m^2). Gen: Afebrile. No acute distress. Nontoxic in appearance, well developed, well nourished female.  HENT: AT. [place]. Bilateral TM visualized, left TM with air fluid level.MMM, no oral lesions. Bilateral nares with severe erythema, no bogginess. Throat without erythema or exudates. PND present . Cough and hoarseness present on exam. TTP bilateral max sinus Eyes:Pupils Equal Round Reactive to light, Extraocular movements intact,  Conjunctiva without redness, discharge or icterus. Neck/lymp/endocrine: Supple,no lymphadenopathy CV: RRR  Chest: CTAB, no wheeze or crackles. Good air movement, normal resp effort.  Abd: Soft. NTND. BS present Skin: No rashes, purpura or petechiae.  Neuro: Normal gait. PERLA. EOMi. Alert. Oriented x3  Psych: Normal affect, dress and demeanor. Normal speech. Normal thought content and judgment.  Assessment/Plan: Tiffany Horn is a 55 y.o. female present for acute OV for  Acute bronchitis, unspecified organism - doxycycline (VIBRA-TABS) 100 MG tablet; Take 1 tablet (100 mg total) by mouth 2 (two) times daily.  Dispense: 14 tablet; Refill: 0 - benzonatate (TESSALON) 200 MG capsule; Take 1 capsule (200 mg total) by mouth 2 (two) times daily as needed for cough.  Dispense: 20 capsule; Refill: 0 - HYDROcodone-homatropine (HYCODAN) 5-1.5 MG/5ML syrup; Take 5 mLs by mouth every 8 (eight) hours as needed for cough.  Dispense: 120 mL; Refill: 0 - Mucinex, floanse  Hypothyroidism, unspecified hypothyroidism type Pt do for rpt check (overdue) will collect today. Pt will need to follow up on this issue.  - TSH   > 25 minutes spent with patient, >50% of time spent face to face counseling patient and coordinating care.  electronically signed by:  Howard Pouch, DO  [place]

## 2015-05-27 NOTE — Patient Instructions (Signed)
Tessalon perles during the day for cough; hycodan for nighttime cough Doxycycline - 7 days BID Start: Mucinex and  floanse

## 2015-05-28 ENCOUNTER — Telehealth: Payer: Self-pay | Admitting: Family Medicine

## 2015-05-28 DIAGNOSIS — E039 Hypothyroidism, unspecified: Secondary | ICD-10-CM

## 2015-05-28 MED ORDER — LEVOTHYROXINE SODIUM 100 MCG PO TABS: 100.0000 ug | ORAL_TABLET | Freq: Every day | ORAL | Status: DC

## 2015-05-28 NOTE — Telephone Encounter (Signed)
Spoke with patient reviewed lab results and instructions with patient.

## 2015-05-28 NOTE — Telephone Encounter (Signed)
Patient, her thyroid levels were almost perfect. One small adjustment in her medications and repeat labs in 12 weeks and hopefully at that time it'll be normal.

## 2015-06-07 ENCOUNTER — Telehealth: Payer: Self-pay | Admitting: Family Medicine

## 2015-06-07 ENCOUNTER — Ambulatory Visit: Payer: BC Managed Care – PPO | Admitting: Family Medicine

## 2015-06-07 NOTE — Telephone Encounter (Signed)
Patient left VM on front desk phone. She has started new medicine, does she need to keep 06/09/15 appt?

## 2015-06-08 NOTE — Telephone Encounter (Signed)
Patient aware will CB to schedule an appt in July

## 2015-06-08 NOTE — Telephone Encounter (Signed)
She can follow up 10-12 weeks with provider appt at that time.

## 2015-06-09 ENCOUNTER — Ambulatory Visit: Payer: BC Managed Care – PPO | Admitting: Family Medicine

## 2015-06-09 ENCOUNTER — Telehealth: Payer: Self-pay | Admitting: *Deleted

## 2015-06-09 NOTE — Telephone Encounter (Signed)
Patient called states she is having congestion,ear pressure and cough she wants something called in. Explained to patient she would need evaluation . Patient offered an appt for today patient declined appt.  She states she will just see if OTC meds will help.

## 2015-06-11 ENCOUNTER — Ambulatory Visit (HOSPITAL_BASED_OUTPATIENT_CLINIC_OR_DEPARTMENT_OTHER)
Admission: RE | Admit: 2015-06-11 | Discharge: 2015-06-11 | Disposition: A | Discharge status: Home / Self Care | Payer: BC Managed Care – PPO | Source: Ambulatory Visit | Attending: Family Medicine | Admitting: Family Medicine

## 2015-06-11 ENCOUNTER — Ambulatory Visit (INDEPENDENT_AMBULATORY_CARE_PROVIDER_SITE_OTHER): Payer: BC Managed Care – PPO | Admitting: Family Medicine

## 2015-06-11 ENCOUNTER — Encounter: Payer: Self-pay | Admitting: Family Medicine

## 2015-06-11 ENCOUNTER — Ambulatory Visit (HOSPITAL_BASED_OUTPATIENT_CLINIC_OR_DEPARTMENT_OTHER)
Admission: RE | Admit: 2015-06-11 | Discharge: 2015-06-11 | Disposition: A | Discharge status: Home / Self Care | Payer: BC Managed Care – PPO | Source: Ambulatory Visit | Attending: Family | Admitting: Family

## 2015-06-11 ENCOUNTER — Telehealth: Payer: Self-pay | Admitting: Family Medicine

## 2015-06-11 ENCOUNTER — Other Ambulatory Visit (HOSPITAL_BASED_OUTPATIENT_CLINIC_OR_DEPARTMENT_OTHER): Payer: Self-pay | Admitting: Family

## 2015-06-11 VITALS — BP 140/89 | HR 80 | Temp 97.8°F | Resp 20 | Wt 164.5 lb

## 2015-06-11 DIAGNOSIS — R05 Cough: Secondary | ICD-10-CM | POA: Diagnosis present

## 2015-06-11 DIAGNOSIS — X58XXXA Exposure to other specified factors, initial encounter: Secondary | ICD-10-CM | POA: Insufficient documentation

## 2015-06-11 DIAGNOSIS — R059 Cough, unspecified: Secondary | ICD-10-CM

## 2015-06-11 DIAGNOSIS — J0101 Acute recurrent maxillary sinusitis: Secondary | ICD-10-CM

## 2015-06-11 DIAGNOSIS — T1490XA Injury, unspecified, initial encounter: Secondary | ICD-10-CM

## 2015-06-11 DIAGNOSIS — T1490 Injury, unspecified: Secondary | ICD-10-CM | POA: Diagnosis not present

## 2015-06-11 DIAGNOSIS — J0100 Acute maxillary sinusitis, unspecified: Secondary | ICD-10-CM | POA: Insufficient documentation

## 2015-06-11 LAB — CBC WITH DIFFERENTIAL/PLATELET
Basophils Absolute: 0.1 10*3/uL (ref 0.0–0.1)
Basophils Relative: 1 % (ref 0.0–3.0)
Eosinophils Absolute: 0.3 10*3/uL (ref 0.0–0.7)
Eosinophils Relative: 5.3 % — ABNORMAL HIGH (ref 0.0–5.0)
HCT: 44.3 % (ref 36.0–46.0)
Hemoglobin: 14.9 g/dL (ref 12.0–15.0)
Lymphocytes Relative: 25.1 % (ref 12.0–46.0)
Lymphs Abs: 1.6 10*3/uL (ref 0.7–4.0)
MCHC: 33.6 g/dL (ref 30.0–36.0)
MCV: 87 fl (ref 78.0–100.0)
Monocytes Absolute: 0.6 10*3/uL (ref 0.1–1.0)
Monocytes Relative: 8.9 % (ref 3.0–12.0)
Neutro Abs: 3.8 10*3/uL (ref 1.4–7.7)
Neutrophils Relative %: 59.7 % (ref 43.0–77.0)
Platelets: 233 10*3/uL (ref 150.0–400.0)
RBC: 5.1 Mil/uL (ref 3.87–5.11)
RDW: 13.9 % (ref 11.5–15.5)
WBC: 6.4 10*3/uL (ref 4.0–10.5)

## 2015-06-11 MED ORDER — LEVOFLOXACIN 750 MG PO TABS: 750.0000 mg | ORAL_TABLET | Freq: Every day | ORAL | Status: DC

## 2015-06-11 NOTE — Telephone Encounter (Addendum)
Please call pt: - cxr normal, cbc normal.

## 2015-06-11 NOTE — Patient Instructions (Signed)
levquin prescribed.  If not completely resolved will need to send you to ENT.  Continue flonase, muxinex, zyrtec.  Go to  ED if anxiety/depresson is worsening.

## 2015-06-11 NOTE — Progress Notes (Signed)
Patient ID: Tiffany Horn, female   DOB: 01-Sep-1960, 55 y.o.   MRN: [id number]    Tiffany Horn , 1960-02-22, 55 y.o., female MRN: [id number]  CC: cough Subjective:  Cough: Patient presents for an acute office visit secondary to continued congestion and mildly productive cough. Patient has recently been treated with doxycycline, which she states she had "leftover "medication and restarted today. Patient states she felt completely better after last visit, however 3 nights ago she started to feel worse again and has become more fatigue. Patient dates 2 days ago she felt like she became dizzy, nausea, sweating, decreased appetite and headache. Patient states she also cut disturbing news about her mother (she does not want to talk about it today) that caused her to have headache, including sharp pains on the left side of her head worsening which she's had for 2 months. Patient denies fever or chills, nausea, shortness of breath, wheezing, vomit or diarrhea. Of note patient went almost a week without her Zoloft medication secondary to her psychiatrist not refilling her medication on time (per patient). Patient states she has been more emotional since missing these doses. She is tearful today. She also admits to worrying that there is something wrong with her because she is unable to get rid of her sinus infection/bronchitis symptoms.  Influenza and tdap UTD. No lung disease. She has a h/o bronchitis a few times a year.  Allergies  Allergen Reactions  . Amoxicillin Diarrhea   Social History  Substance Use Topics  . Smoking status: Passive Smoke Exposure - Never Smoker  . Smokeless tobacco: Never Used     Comment: as a very young child  . Alcohol Use: 0.6 - 1.2 oz/week    1-2 Glasses of wine per week   Past Medical History  Diagnosis Date  . Migraines   . Depression   . Anxiety   . Hx of irritable bowel syndrome     x2  . Complication of anesthesia     waking up is not easy  . PONV  (postoperative nausea and vomiting)   . H/O miscarriage, not currently pregnant   . Pneumonia     4 episodes; hosp. admission 2014  . Bronchitis     hx - recurrent  . Hypokalemia     with PNA admission (2.5)  . Thyroid disease   . Arthritis     knees and spine, shoulder  . Hypertension     not taking any meds at present - under control per patient  . Hyperlipidemia     diet controlled - no medication   Past Surgical History  Procedure Laterality Date  . Tonsillectomy and adenoidectomy    . Laparoscopic abdominal exploration  1994    endometriosis  . Dilation and curettage of uterus    . Breast surgery      implants, then had them removed  . Orif humerus fracture Left 04/01/2013    [facility] - shoulder  . Orif humerus fracture Left 04/01/2013    Procedure: OPEN REDUCTION INTERNAL FIXATION (ORIF) LEFT PROXIMAL HUMERUS FRACTURE;  Surgeon: Mcarthur Rossetti, MD;  Location: [facility];  Service: Orthopedics;  Laterality: Left;  . Colonoscopy      greater 10 yrs ago - ? [facility]   Family History  Problem Relation Age of Onset  . Breast cancer Mother   . Diabetes Mother   . Cancer Father     colon  . Diabetes Father   . Dementia Father   .  Colon cancer Father   . Stroke Paternal Grandmother   . Diabetes Paternal Grandmother   . Colon polyps Neg Hx   . Esophageal cancer Neg Hx   . Rectal cancer Neg Hx   . Stomach cancer Neg Hx      Medication List       This list is accurate as of: 06/11/15 10:53 AM.  Always use your most recent med list.               benzonatate 200 MG capsule  Commonly known as:  TESSALON  Take 1 capsule (200 mg total) by mouth 2 (two) times daily as needed for cough.     buPROPion 300 MG 24 hr tablet  Commonly known as:  WELLBUTRIN XL  Take 1 tablet (300 mg total) by mouth daily.     Cholecalciferol 50000 units capsule  Take 1 capsule (50,000 Units total) by mouth 2 (two) times a week.     clonazePAM 1 MG tablet  Commonly known  as:  KLONOPIN  Take 1 tablet (1 mg total) by mouth 3 (three) times daily as needed.     HYDROcodone-homatropine 5-1.5 MG/5ML syrup  Commonly known as:  HYCODAN  Take 5 mLs by mouth every 8 (eight) hours as needed for cough.     ibuprofen 800 MG tablet  Commonly known as:  ADVIL,MOTRIN  Take 800 mg by mouth every 8 (eight) hours as needed for moderate pain.     levothyroxine 100 MCG tablet  Commonly known as:  SYNTHROID, LEVOTHROID  Take 1 tablet (100 mcg total) by mouth daily.     rizatriptan 5 MG tablet  Commonly known as:  MAXALT  Take 1 tablet (5 mg total) by mouth as needed for migraine. May repeat in 2 hours if needed     sertraline 100 MG tablet  Commonly known as:  ZOLOFT  Take 2 tablets (200 mg total) by mouth daily.     vitamin B-12 1000 MCG tablet  Commonly known as:  CYANOCOBALAMIN  2000 mcg QD for 2 weeks, then 1000 mcg daily.       ROS: Negative, with the exception of above mentioned in HPI  Objective:  BP 140/89 mmHg  Pulse 80  Temp(Src) 97.8 F (36.6 C)  Resp 20  Wt 164 lb 8 oz (74.617 kg)  SpO2 97%  LMP 01/31/2007 Body mass index is 26.56 kg/(m^2). Gen: Afebrile. No acute distress. Nontoxic in appearance, well developed, well nourished female.  HENT: AT. [place]. Bilateral TM visualized, left TM with air fluid level.MMM, no oral lesions. Bilateral nares with severe erythema, no bogginess. Throat without erythema or exudates. PND present . Mild Cough present. No hoarseness present on examination.TTP bilateral max sinus Eyes:Pupils Equal Round Reactive to light, Extraocular movements intact,  Conjunctiva without redness, discharge or icterus. Neck/lymp/endocrine: Supple ,no lymphadenopathy CV: RRR  Chest: CTAB, no wheeze or crackles. Good air movement, normal resp effort.  Abd: Soft. NTND. BS present Skin: No rashes, purpura or petechiae.  Neuro: Normal gait. PERLA. EOMi. Alert. Oriented x3  Psych: Tearful today. More anxious. Normal dress. Normal speech.  Normal thought content and judgment.  Assessment/Plan: Tiffany Horn is a 55 y.o. female present for acute OV for  1. Cough/Acute recurrent maxillary sinusitis - DG Chest 2 View; Future - levofloxacin (LEVAQUIN) 750 MG tablet; Take 1 tablet (750 mg total) by mouth daily.  Dispense: 5 tablet; Refill: 0 - CBC w/Diff - Recurrent sinusitis on exam today. Will obtain CBC and  chest x-ray for reassurance purposes. Patient will be called with these results once they are available on Monday. If not completely resolved symptoms with Levaquin, refer to ENT. - Patient is to complete all antibiotic regimen to completion. - Patient to follow-up in 2-4 weeks if not completely resolved.   > 25 minutes spent with patient, >50% of time spent face to face counseling patient and coordinating care.  electronically signed by:  Howard Pouch, DO  [place]

## 2015-06-14 ENCOUNTER — Telehealth: Payer: Self-pay | Admitting: *Deleted

## 2015-06-14 NOTE — Telephone Encounter (Signed)
Pt does not have a history of asthma. She was not wheezing on exam. Her cxr was normal. I do not feel she will benefit from starting an inhaler at this time unless she is now wheezing. As far as an excuse for multiple days, I am uncertain if all of these are indicated for her sinus infection vs acute situation with anxiety. I will write for excuse this time, no further excused will be provided for her this acute  Issue. If symptoms are not resolving with medication use, she needs to be referred to ENT as we discussed and consider imagining of the sinus cavity. Please provide with excuse.

## 2015-06-14 NOTE — Telephone Encounter (Signed)
Patient left VM on front desk phone requesting Rx for inhaler & for a work excuse for last Wed, Thurs, Friday, today, & tomorrow.

## 2015-06-14 NOTE — Telephone Encounter (Signed)
PLEASE NOTE: All timestamps contained within this report are represented as [place] Standard Time. CONFIDENTIALTY NOTICE: This fax transmission is intended only for the addressee. It contains information that is legally privileged, confidential or otherwise protected from use or disclosure. If you are not the intended recipient, you are strictly prohibited from reviewing, disclosing, copying using or disseminating any of this information or taking any action in reliance on or regarding this information. If you have received this fax in error, please notify us immediately by telephone so that we can arrange for its return to [place]. Phone: [phone], Toll-Free: [phone], Fax: [phone] Page: 1 of 2 Call Id: [id number] [place] Patient Name: Tiffany Horn Gender: Female DOB: 1961/01/02 Age: 55 Y 11 M 27 D Return Phone Number: [phone] (Primary) Address: City/State/Zip: [place] Client [place] [facility] - Client Client Site [place] [facility] Physician Raoul Pitch, [place] Contact Type Call Who Is Calling Patient / Member / Family / Caregiver Call Type Triage / Clinical Relationship To Patient Self Return Phone Number [phone] (Primary) Chief Complaint Medical Device, Procedure and Surgery Questions (non symptomatic) Reason for Call Symptomatic / Request for [place] states is wanting know chest x-ray results PreDisposition InappropriateToAsk Translation No Nurse Assessment Nurse: Allene Dillon, RN, [place] Date/Time (Eastern Time): 06/13/2015 12:35:03 AM Confirm and document reason for call. If symptomatic, describe symptoms. You must click the next button to save text entered. ---Caller states is wanting know chest x-ray results. Cxr yesterday feeling worse today. Has the patient traveled out of the country within the last  30 days? ---No Does the patient have any new or worsening symptoms? ---Yes Will a triage be completed? ---Yes Related visit to physician within the last 2 weeks? ---Yes Does the PT have any chronic conditions? (i.e. diabetes, asthma, etc.) ---Yes List chronic conditions. ---chronic lung issues Is the patient pregnant or possibly pregnant? (Ask all females between the ages of 48-55) ---No Is this a behavioral health or substance abuse call? ---No Guidelines Guideline Title Affirmed Question Affirmed Notes Nurse Date/Time (Eastern Time) Cough - Acute Non- Productive Chest pain (Exception: MILD central chest pain, present only when coughing) Burrell, RN, Tabatha 06/13/2015 12:37:20 AM Disp. Time Eilene Ghazi Time) Disposition Final User 06/13/2015 12:43:23 AM Paged On Call back to Call Center [place], RN, Tabatha PLEASE NOTE: All timestamps contained within this report are represented as [place] Standard Time. CONFIDENTIALTY NOTICE: This fax transmission is intended only for the addressee. It contains information that is legally privileged, confidential or otherwise protected from use or disclosure. If you are not the intended recipient, you are strictly prohibited from reviewing, disclosing, copying using or disseminating any of this information or taking any action in reliance on or regarding this information. If you have received this fax in error, please notify us immediately by telephone so that we can arrange for its return to [place]. Phone: [phone], Toll-Free: [phone], Fax: [phone] Page: 2 of 2 Call Id: [id number] 06/13/2015 1:14:54 AM Go to ED Now Yes Allene Dillon, RN, Gabriel Earing Understands: Yes Disagree/Comply: Comply Care Advice Given Per Guideline GO TO ED NOW: You need to be seen in the Emergency Department. Go to the ER at ___________ [place] now. Drive carefully. Referrals GO TO FACILITY UNDECIDED Paging DoctorName Phone DateTime Result/Outcome Message  Type Notes Ria Bush - MD [id number] 06/13/2015 12:43:23 AM Paged On Call Back to Call Center Doctor Paged please call  Cassandria Santee, RN at [facility] call center at [place] - MD 06/13/2015 1:14:07 AM Spoke with On Call - General Message Result reported triage result to me. he looked up cxr and cbc results whcih were normal. advise to go to the ER if short of breath or feeling worse after a few more doses of levaquin

## 2015-06-14 NOTE — Telephone Encounter (Signed)
Patient had Triage page on call physician for test results. Patient given results and instructions. See Triage note.

## 2015-06-15 ENCOUNTER — Encounter (HOSPITAL_BASED_OUTPATIENT_CLINIC_OR_DEPARTMENT_OTHER): Payer: Self-pay | Admitting: Emergency Medicine

## 2015-06-15 ENCOUNTER — Emergency Department (HOSPITAL_BASED_OUTPATIENT_CLINIC_OR_DEPARTMENT_OTHER)
Admission: EM | Admit: 2015-06-15 | Discharge: 2015-06-15 | Disposition: A | Discharge status: Home / Self Care | Payer: BC Managed Care – PPO | Attending: Emergency Medicine | Admitting: Emergency Medicine

## 2015-06-15 DIAGNOSIS — M1389 Other specified arthritis, multiple sites: Secondary | ICD-10-CM | POA: Diagnosis not present

## 2015-06-15 DIAGNOSIS — F329 Major depressive disorder, single episode, unspecified: Secondary | ICD-10-CM | POA: Insufficient documentation

## 2015-06-15 DIAGNOSIS — I10 Essential (primary) hypertension: Secondary | ICD-10-CM | POA: Insufficient documentation

## 2015-06-15 DIAGNOSIS — T50905A Adverse effect of unspecified drugs, medicaments and biological substances, initial encounter: Secondary | ICD-10-CM

## 2015-06-15 DIAGNOSIS — J209 Acute bronchitis, unspecified: Secondary | ICD-10-CM | POA: Diagnosis not present

## 2015-06-15 DIAGNOSIS — T3695XA Adverse effect of unspecified systemic antibiotic, initial encounter: Secondary | ICD-10-CM | POA: Insufficient documentation

## 2015-06-15 DIAGNOSIS — E785 Hyperlipidemia, unspecified: Secondary | ICD-10-CM | POA: Insufficient documentation

## 2015-06-15 DIAGNOSIS — Z7722 Contact with and (suspected) exposure to environmental tobacco smoke (acute) (chronic): Secondary | ICD-10-CM | POA: Diagnosis not present

## 2015-06-15 DIAGNOSIS — R0981 Nasal congestion: Secondary | ICD-10-CM | POA: Diagnosis present

## 2015-06-15 LAB — BASIC METABOLIC PANEL
Anion gap: 8 (ref 5–15)
BUN: 11 mg/dL (ref 6–20)
CO2: 27 mmol/L (ref 22–32)
Calcium: 8.9 mg/dL (ref 8.9–10.3)
Chloride: 105 mmol/L (ref 101–111)
Creatinine, Ser: 0.63 mg/dL (ref 0.44–1.00)
GFR calc Af Amer: >60 mL/min (ref >60)
GFR calc non Af Amer: >60 mL/min (ref >60)
Glucose, Bld: 95 mg/dL (ref 65–99)
Potassium: 3.3 mmol/L — ABNORMAL LOW (ref 3.5–5.1)
Sodium: 140 mmol/L (ref 135–145)

## 2015-06-15 LAB — CBC WITH DIFFERENTIAL/PLATELET
Basophils Absolute: 0 10*3/uL (ref 0.0–0.1)
Basophils Relative: 0 %
Eosinophils Absolute: 0.1 10*3/uL (ref 0.0–0.7)
Eosinophils Relative: 2 %
HCT: 40 % (ref 36.0–46.0)
Hemoglobin: 13.5 g/dL (ref 12.0–15.0)
Lymphocytes Relative: 29 %
Lymphs Abs: 1.6 10*3/uL (ref 0.7–4.0)
MCH: 29.3 pg (ref 26.0–34.0)
MCHC: 33.8 g/dL (ref 30.0–36.0)
MCV: 86.8 fL (ref 78.0–100.0)
Monocytes Absolute: 0.8 10*3/uL (ref 0.1–1.0)
Monocytes Relative: 14 %
Neutro Abs: 3 10*3/uL (ref 1.7–7.7)
Neutrophils Relative %: 55 %
Platelets: 200 10*3/uL (ref 150–400)
RBC: 4.61 MIL/uL (ref 3.87–5.11)
RDW: 13 % (ref 11.5–15.5)
WBC: 5.5 10*3/uL (ref 4.0–10.5)

## 2015-06-15 MED ORDER — SODIUM CHLORIDE 0.9 % IV BOLUS (SEPSIS)
1000.0000 mL | Freq: Once | INTRAVENOUS | Status: AC
  Administered 2015-06-15: 1000 mL via INTRAVENOUS

## 2015-06-15 MED ORDER — DOXYCYCLINE HYCLATE 100 MG PO CAPS: 100.0000 mg | ORAL_CAPSULE | Freq: Two times a day (BID) | ORAL | Status: DC

## 2015-06-15 NOTE — ED Notes (Signed)
Pt reports week long history of sinus infection, started on levaquin, reports that she started having side effects of levaquin, she is the caregiver for adult parent and unble to get to doctor until today

## 2015-06-15 NOTE — ED Notes (Signed)
Sinus issues and multiple antibiotics,

## 2015-06-15 NOTE — ED Provider Notes (Signed)
CSN: [id number]     Arrival date & time 06/15/15  0950 History   First MD Initiated Contact with Patient 06/15/15 1010     Chief Complaint  Patient presents with  . sinus      (Consider location/radiation/quality/duration/timing/severity/associated sxs/prior Treatment) HPI Comments: Patient is a 55 year old female with history of migraines, depression, anxiety, irritable bowel. She presents for evaluation of vomiting, loose stools, weakness, and sinus congestion. She has been on Levaquin for several days for her sinus issues and believes that the other symptoms are a side effect of her antibiotic. She denies any fevers or chills. She denies any bloody stools.  The history is provided by the patient.    Past Medical History  Diagnosis Date  . Migraines   . Depression   . Anxiety   . Hx of irritable bowel syndrome     x2  . Complication of anesthesia     waking up is not easy  . PONV (postoperative nausea and vomiting)   . H/O miscarriage, not currently pregnant   . Pneumonia     4 episodes; hosp. admission 2014  . Bronchitis     hx - recurrent  . Hypokalemia     with PNA admission (2.5)  . Thyroid disease   . Arthritis     knees and spine, shoulder  . Hypertension     not taking any meds at present - under control per patient  . Hyperlipidemia     diet controlled - no medication   Past Surgical History  Procedure Laterality Date  . Tonsillectomy and adenoidectomy    . Laparoscopic abdominal exploration  1994    endometriosis  . Dilation and curettage of uterus    . Breast surgery      implants, then had them removed  . Orif humerus fracture Left 04/01/2013    [facility] - shoulder  . Orif humerus fracture Left 04/01/2013    Procedure: OPEN REDUCTION INTERNAL FIXATION (ORIF) LEFT PROXIMAL HUMERUS FRACTURE;  Surgeon: Mcarthur Rossetti, MD;  Location: [place];  Service: Orthopedics;  Laterality: Left;  . Colonoscopy      greater 10 yrs ago - ? [facility]    Family History  Problem Relation Age of Onset  . Breast cancer Mother   . Diabetes Mother   . Cancer Father     colon  . Diabetes Father   . Dementia Father   . Colon cancer Father   . Stroke Paternal Grandmother   . Diabetes Paternal Grandmother   . Colon polyps Neg Hx   . Esophageal cancer Neg Hx   . Rectal cancer Neg Hx   . Stomach cancer Neg Hx    Social History  Substance Use Topics  . Smoking status: Passive Smoke Exposure - Never Smoker  . Smokeless tobacco: Never Used     Comment: as a very young child  . Alcohol Use: 0.6 - 1.2 oz/week    1-2 Glasses of wine per week   OB History    Gravida Para Term Preterm AB TAB SAB Ectopic Multiple Living   2 0 0 0 2  2   0     Review of Systems  All other systems reviewed and are negative.     Allergies  Amoxicillin  Home Medications   Prior to Admission medications   Medication Sig Start Date End Date Taking? Authorizing Provider  buPROPion (WELLBUTRIN XL) 300 MG 24 hr tablet Take 1 tablet (300 mg total) by  mouth daily. 04/06/15   Renee A Kuneff, DO  Cholecalciferol 50000 units capsule Take 1 capsule (50,000 Units total) by mouth 2 (two) times a week. 02/15/15   Renee A Kuneff, DO  clonazePAM (KLONOPIN) 1 MG tablet Take 1 tablet (1 mg total) by mouth 3 (three) times daily as needed. 02/15/15   Renee A Kuneff, DO  HYDROcodone-homatropine (HYCODAN) 5-1.5 MG/5ML syrup Take 5 mLs by mouth every 8 (eight) hours as needed for cough. 05/27/15   Renee A Kuneff, DO  ibuprofen (ADVIL,MOTRIN) 800 MG tablet Take 800 mg by mouth every 8 (eight) hours as needed for moderate pain.    Historical Provider, MD  levofloxacin (LEVAQUIN) 750 MG tablet Take 1 tablet (750 mg total) by mouth daily. 06/11/15   Renee A Kuneff, DO  levothyroxine (SYNTHROID, LEVOTHROID) 100 MCG tablet Take 1 tablet (100 mcg total) by mouth daily. 05/28/15   Renee A Kuneff, DO  rizatriptan (MAXALT) 5 MG tablet Take 1 tablet (5 mg total) by mouth as needed for migraine.  May repeat in 2 hours if needed 02/15/15   Renee A Kuneff, DO  sertraline (ZOLOFT) 100 MG tablet Take 2 tablets (200 mg total) by mouth daily. 02/15/15   Renee A Kuneff, DO  vitamin B-12 (CYANOCOBALAMIN) 1000 MCG tablet 2000 mcg QD for 2 weeks, then 1000 mcg daily. 02/15/15   Renee A Kuneff, DO   BP 120/88 mmHg  Pulse 87  Temp(Src) 97.9 F (36.6 C) (Oral)  Resp 18  SpO2 98%  LMP 01/31/2007 Physical Exam  Constitutional: She is oriented to person, place, and time. She appears well-developed and well-nourished. No distress.  HENT:  Head: Normocephalic and atraumatic.  Mouth/Throat: Oropharynx is clear and moist.  Neck: Normal range of motion. Neck supple.  Cardiovascular: Normal rate and regular rhythm.  Exam reveals no gallop and no friction rub.   No murmur heard. Pulmonary/Chest: Effort normal and breath sounds normal. No respiratory distress. She has no wheezes.  Abdominal: Soft. Bowel sounds are normal. She exhibits no distension. There is no tenderness.  Musculoskeletal: Normal range of motion.  Neurological: She is alert and oriented to person, place, and time.  Skin: Skin is warm and dry. She is not diaphoretic.  Nursing note and vitals reviewed.   ED Course  Procedures (including critical care time) Labs Review Labs Reviewed  BASIC METABOLIC PANEL  CBC WITH DIFFERENTIAL/PLATELET    Imaging Review No results found. I have personally reviewed and evaluated these images and lab results as part of my medical decision-making.   EKG Interpretation None      MDM   Final diagnoses:  None    Patient presents with complaints of URI-like symptoms, weakness, fatigue, and nausea and vomiting. She attributes these symptoms to be related to Levaquin that she was prescribed by her primary doctor. She found on the Internet that Levaquin and Wellbutrin are not compatible and is requesting a different antibiotic for her sinus issues. She has also had nausea and vomiting and feels  dehydrated. Her vital signs and laboratory studies are not consistent with this, however she was given 1 L of normal saline. She will be discharged with a different antibiotic and is advised to follow-up with her primary Dr. if not improving in the next few days.    Veryl Speak, MD 06/15/15 [phone]

## 2015-06-15 NOTE — Discharge Instructions (Signed)
Doxycycline as prescribed.  Drink plenty of fluids and get plenty of rest.  Follow-up with your primary Dr. if not improving in the next week.   Acute Bronchitis Bronchitis is inflammation of the airways that extend from the windpipe into the lungs (bronchi). The inflammation often causes mucus to develop. This leads to a cough, which is the most common symptom of bronchitis.  In acute bronchitis, the condition usually develops suddenly and goes away over time, usually in a couple weeks. Smoking, allergies, and asthma can make bronchitis worse. Repeated episodes of bronchitis may cause further lung problems.  CAUSES Acute bronchitis is most often caused by the same virus that causes a cold. The virus can spread from person to person (contagious) through coughing, sneezing, and touching contaminated objects. SIGNS AND SYMPTOMS   Cough.   Fever.   Coughing up mucus.   Body aches.   Chest congestion.   Chills.   Shortness of breath.   Sore throat.  DIAGNOSIS  Acute bronchitis is usually diagnosed through a physical exam. Your health care provider will also ask you questions about your medical history. Tests, such as chest X-rays, are sometimes done to rule out other conditions.  TREATMENT  Acute bronchitis usually goes away in a couple weeks. Oftentimes, no medical treatment is necessary. Medicines are sometimes given for relief of fever or cough. Antibiotic medicines are usually not needed but may be prescribed in certain situations. In some cases, an inhaler may be recommended to help reduce shortness of breath and control the cough. A cool mist vaporizer may also be used to help thin bronchial secretions and make it easier to clear the chest.  HOME CARE INSTRUCTIONS  Get plenty of rest.   Drink enough fluids to keep your urine clear or pale yellow (unless you have a medical condition that requires fluid restriction). Increasing fluids may help thin your respiratory  secretions (sputum) and reduce chest congestion, and it will prevent dehydration.   Take medicines only as directed by your health care provider.  If you were prescribed an antibiotic medicine, finish it all even if you start to feel better.  Avoid smoking and secondhand smoke. Exposure to cigarette smoke or irritating chemicals will make bronchitis worse. If you are a smoker, consider using nicotine gum or skin patches to help control withdrawal symptoms. Quitting smoking will help your lungs heal faster.   Reduce the chances of another bout of acute bronchitis by washing your hands frequently, avoiding people with cold symptoms, and trying not to touch your hands to your mouth, nose, or eyes.   Keep all follow-up visits as directed by your health care provider.  SEEK MEDICAL CARE IF: Your symptoms do not improve after 1 week of treatment.  SEEK IMMEDIATE MEDICAL CARE IF:  You develop an increased fever or chills.   You have chest pain.   You have severe shortness of breath.  You have bloody sputum.   You develop dehydration.  You faint or repeatedly feel like you are going to pass out.  You develop repeated vomiting.  You develop a severe headache. MAKE SURE YOU:   Understand these instructions.  Will watch your condition.  Will get help right away if you are not doing well or get worse.   This information is not intended to replace advice given to you by your health care provider. Make sure you discuss any questions you have with your health care provider.   Document Released: 02/24/2004 Document Revised: 02/06/2014 Document  Reviewed: 07/09/2012 Elsevier Interactive Patient Education [employer].

## 2015-06-15 NOTE — Telephone Encounter (Signed)
Left message for patient to return call.

## 2015-06-15 NOTE — Telephone Encounter (Signed)
Note printed.

## 2016-01-13 ENCOUNTER — Encounter: Payer: Self-pay | Admitting: *Deleted

## 2016-03-28 ENCOUNTER — Telehealth: Payer: Self-pay | Admitting: *Deleted

## 2016-03-28 NOTE — Telephone Encounter (Signed)
Patient in 08 recall for 03/2016. Please contact patient to schedule AEX/PAP Thanks

## 2016-03-28 NOTE — Telephone Encounter (Signed)
Left message for patient to call & schedule aex. 

## 2016-03-31 ENCOUNTER — Telehealth: Payer: Self-pay | Admitting: Family Medicine

## 2016-03-31 ENCOUNTER — Other Ambulatory Visit: Payer: Self-pay | Admitting: *Deleted

## 2016-03-31 ENCOUNTER — Encounter: Payer: Self-pay | Admitting: *Deleted

## 2016-03-31 NOTE — Telephone Encounter (Signed)
Received request for refill on Zoloft patient has not had refill on this since Jan 2017 and has not been seen since May 2017 refill declined patient needs office visit . Message sent in My Chart

## 2016-03-31 NOTE — Telephone Encounter (Signed)
aex is scheduled for 04-04-16

## 2016-03-31 NOTE — Telephone Encounter (Signed)
Left message for patient to call & schedule aex due to previous pap history.

## 2016-03-31 NOTE — Telephone Encounter (Signed)
[facility] calling to request a new prescription be out in for sertraline (ZOLOFT) 100 MG tablet.  Thank you,  -LL

## 2016-04-04 ENCOUNTER — Ambulatory Visit (INDEPENDENT_AMBULATORY_CARE_PROVIDER_SITE_OTHER): Payer: BC Managed Care – PPO | Admitting: Family Medicine

## 2016-04-04 ENCOUNTER — Telehealth: Payer: Self-pay | Admitting: Certified Nurse Midwife

## 2016-04-04 ENCOUNTER — Ambulatory Visit: Payer: BC Managed Care – PPO | Admitting: Certified Nurse Midwife

## 2016-04-04 ENCOUNTER — Other Ambulatory Visit: Payer: Self-pay | Admitting: Family Medicine

## 2016-04-04 ENCOUNTER — Encounter: Payer: Self-pay | Admitting: Family Medicine

## 2016-04-04 VITALS — BP 124/82 | HR 70 | Temp 98.1°F | Resp 20 | Ht 66.0 in | Wt 171.8 lb

## 2016-04-04 DIAGNOSIS — Z6831 Body mass index (BMI) 31.0-31.9, adult: Secondary | ICD-10-CM

## 2016-04-04 DIAGNOSIS — E039 Hypothyroidism, unspecified: Secondary | ICD-10-CM | POA: Diagnosis not present

## 2016-04-04 DIAGNOSIS — R7303 Prediabetes: Secondary | ICD-10-CM

## 2016-04-04 DIAGNOSIS — G43909 Migraine, unspecified, not intractable, without status migrainosus: Secondary | ICD-10-CM

## 2016-04-04 DIAGNOSIS — F418 Other specified anxiety disorders: Secondary | ICD-10-CM

## 2016-04-04 DIAGNOSIS — E559 Vitamin D deficiency, unspecified: Secondary | ICD-10-CM

## 2016-04-04 DIAGNOSIS — E538 Deficiency of other specified B group vitamins: Secondary | ICD-10-CM

## 2016-04-04 DIAGNOSIS — Z130 Encounter for screening for diseases of the blood and blood-forming organs and certain disorders involving the immune mechanism: Secondary | ICD-10-CM

## 2016-04-04 DIAGNOSIS — E781 Pure hyperglyceridemia: Secondary | ICD-10-CM

## 2016-04-04 MED ORDER — SERTRALINE HCL 100 MG PO TABS: 200.0000 mg | ORAL_TABLET | Freq: Every day | ORAL | 1 refills | Status: DC

## 2016-04-04 MED ORDER — LEVOTHYROXINE SODIUM 100 MCG PO TABS: 100.0000 ug | ORAL_TABLET | Freq: Every day | ORAL | 1 refills | Status: DC

## 2016-04-04 MED ORDER — RIZATRIPTAN BENZOATE 5 MG PO TABS: 5.0000 mg | ORAL_TABLET | ORAL | 4 refills | Status: DC | PRN

## 2016-04-04 NOTE — Telephone Encounter (Signed)
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 Patient cannot leave her Mother today, did not give any further details. Patient rescheduled to 04/12/16.

## 2016-04-04 NOTE — Patient Instructions (Signed)
I have  Refilled your thyroid med and we will retest by lab appt only in 6 weeks by lab appt only. I will adjust med over phone after results if needed.    Make a physical appt to have all other fasting labs. I will place the orders for your fasting labs and you can have them collected with the thyroid recheck, with a provider/physical 2-5 days after to cover all results.   Please help [place] help you:  We are honored you have chosen [facility] for your Primary Care home. Below you will find basic instructions that you may need to access in the future. Please help [place] help you by reading the instructions, which cover many of the frequent questions we experience.   Prescription refills and request:  -In order to allow more efficient response time, please call your pharmacy for all refills. They will forward the request electronically to [place]. This allows for the quickest possible response. Request left on a nurse line can take longer to refill, since these are checked as time allows between office patients and other phone calls.  - refill request can take up to 3-5 working days to complete.  - If request is sent electronically and request is appropiate, it is usually completed in 1-2 business days.  - all patients will need to be seen routinely for all chronic medical conditions requiring prescription medications (see follow-up below). If you are overdue for follow up on your condition, you will be asked to make an appointment and we will call in enough medication to cover you until your appointment (up to 30 days).  - all controlled substances will require a face to face visit to request/refill.  - if you desire your prescriptions to go through a new pharmacy, and have an active script at original pharmacy, you will need to call your pharmacy and have scripts transferred to new pharmacy. This is completed between the pharmacy locations and not by your provider.    Results: If any images or labs were  ordered, it can take up to 1 week to get results depending on the test ordered and the lab/facility running and resulting the test. - Normal or stable results, which do not need further discussion, will be released to your mychart immediately with attached note to you. A call will not be generated for normal results. Please make certain to sign up for mychart. If you have questions on how to activate your mychart you can call the front office.  - If your results need further discussion, our office will attempt to contact you via phone, and if unable to reach you after 2 attempts, we will release your abnormal result to your mychart with instructions.  - All results will be automatically released in mychart after 1 week.  - Your provider will provide you with explanation and instruction on all relevant material in your results. Please keep in mind, results and labs may appear confusing or abnormal to the untrained eye, but it does not mean they are actually abnormal for you personally. If you have any questions about your results that are not covered, or you desire more detailed explanation than what was provided, you should make an appointment with your provider to do so.   Our office handles many outgoing and incoming calls daily. If we have not contacted you within 1 week about your results, please check your mychart to see if there is a message first and if not, then contact  our office.  In helping with this matter, you help decrease call volume, and therefore allow [place] to be able to respond to patients needs more efficiently.   Acute office visits (sick visit):  An acute visit is intended for a new problem and are scheduled in shorter time slots to allow schedule openings for patients with new problems. This is the appropriate visit to discuss a new problem. In order to provide you with excellent quality medical care with proper time for you to explain your problem, have an exam and receive treatment  with instructions, these appointments should be limited to one new problem per visit. If you experience a new problem, in which you desire to be addressed, please make an acute office visit, we save openings on the schedule to accommodate you. Please do not save your new problem for any other type of visit, let us take care of it properly and quickly for you.   Follow up visits:  Depending on your condition(s) your provider will need to see you routinely in order to provide you with quality care and prescribe medication(s). Most chronic conditions (Example: hypertension, Diabetes, depression/anxiety... etc), require visits a couple times a year. Your provider will instruct you on proper follow up for your personal medical conditions and history. Please make certain to make follow up appointments for your condition as instructed. Failing to do so could result in lapse in your medication treatment/refills. If you request a refill, and are overdue to be seen on a condition, we will always provide you with a 30 day script (once) to allow you time to schedule.    Medicare wellness (well visit): - we have a wonderful Nurse Maudie Mercury), that will meet with you and provide you will yearly medicare wellness visits. These visits should occur yearly (can not be scheduled less than 1 calendar year apart) and cover preventive health, immunizations, advance directives and screenings you are entitled to yearly through your medicare benefits. Do not miss out on your entitled benefits, this is when medicare will pay for these benefits to be ordered for you.  These are strongly encouraged by your provider and is the appropriate type of visit to make certain you are up to date with all preventive health benefits. If you have not had your medicare wellness exam in the last 12 months, please make certain to schedule one by calling the office and schedule your medicare wellness with Maudie Mercury as soon as possible.   Yearly physical (well  visit):  - Adults are recommended to be seen yearly for physicals. Check with your insurance and date of your last physical, most insurances require one calendar year between physicals. Physicals include all preventive health topics, screenings, medical exam and labs that are appropriate for gender/age and history. You may have fasting labs needed at this visit. This is a well visit (not a sick visit), acute topics should not be covered during this visit.  - Pediatric patients are seen more frequently when they are younger. Your provider will advise you on well child visit timing that is appropriate for your their age. - This is not a medicare wellness visit. Medicare wellness exams do not have an exam portion to the visit. Some medicare companies allow for a physical, some do not allow a yearly physical. If your medicare allows a yearly physical you can schedule the medicare wellness with our nurse Maudie Mercury and have your physical with your provider after, on the same day. Please check with insurance for  your full benefits.   Late Policy/No Shows:  - all new patients should arrive 15-30 minutes earlier than appointment to allow [place] time  to  obtain all personal demographics,  insurance information and for you to complete office paperwork. - All established patients should arrive 10-15 minutes earlier than appointment time to update all information and be checked in .  - In our best efforts to run on time, if you are late for your appointment you will be asked to either reschedule or if able, we will work you back into the schedule. There will be a wait time to work you back in the schedule,  depending on availability.  - If you are unable to make it to your appointment as scheduled, please call 24 hours ahead of time to allow [place] to fill the time slot with someone else who needs to be seen. If you do not cancel your appointment ahead of time, you may be charged a no show fee.

## 2016-04-04 NOTE — Progress Notes (Signed)
Subjective:    Patient ID: Tiffany Horn, female    DOB: 08-Sep-1960, 56 y.o.   MRN: [id number] Patient Care Team    Relationship Specialty Notifications Start End  [name], DO PCP - General Family Medicine  08/18/14     HPI  Pt presents today to discuss multiple chronic conditions.   Hypothyroid: Patient states she has not been compliant with her Synthroid 100 g daily. She states that she has sometimes only taken it a few times a week secondary to traveling back and forth to help her mother. She states she has not been on it daily for 6 weeks in a row since restarting the medication. Her TSH was elevated last year, and her medication was increased to 100 g secondary to patient reporting compliance at that time. Patient denies any palpitations, flushing, diarrhea, constipation since restarting the medicine.  Migraine:Patient states she needs refills on her Maxalt. She is not needing these very often, but she is out of the medication and would like to have some in the home on standby if needed.  Depression/anxiety:She states she is doing well on the current medications of Wellbutrin 300 mg daily, Zoloft 100 mg twice a day and Klonopin when necessary. She states she really is taking the Klonopin. She states that she has been getting her medicines from her "Dr. Denman George" in [place]. She states she has continued these medications daily. She states that her life has improved since I saw her last. 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 She states she is now going to try to get herself healthy again. She reports that the relationship with her brother has improved, and he is now stepping into help on occasions with their mother as well.   Allergies as of 04/04/2016      Reactions   Amoxicillin Diarrhea      Medication List       Accurate as of 04/04/16 11:14 AM. Always use  your most recent med list.          buPROPion 300 MG 24 hr tablet Commonly known as:  WELLBUTRIN XL Take 1 tablet (300 mg total) by mouth daily.   Cholecalciferol 50000 units capsule Take 1 capsule (50,000 Units total) by mouth 2 (two) times a week.   clonazePAM 1 MG tablet Commonly known as:  KLONOPIN Take 1 tablet (1 mg total) by mouth 3 (three) times daily as needed.   ibuprofen 800 MG tablet Commonly known as:  ADVIL,MOTRIN Take 800 mg by mouth every 8 (eight) hours as needed for moderate pain.   levothyroxine 100 MCG tablet Commonly known as:  SYNTHROID, LEVOTHROID Take 1 tablet (100 mcg total) by mouth daily.   rizatriptan 5 MG tablet Commonly known as:  MAXALT Take 1 tablet (5 mg total) by mouth as needed for migraine. May repeat in 2 hours if needed   sertraline 100 MG tablet Commonly known as:  ZOLOFT Take 2 tablets (200 mg total) by mouth daily.   vitamin B-12 1000 MCG tablet Commonly known as:  CYANOCOBALAMIN 2000 mcg QD for 2 weeks, then 1000 mcg daily.        Past Medical History:  Diagnosis Date  . Anxiety   . Arthritis    knees and spine, shoulder  . Bronchitis    hx - recurrent  . Complication of anesthesia  waking up is not easy  . Depression   . H/O miscarriage, not currently pregnant   . Hx of irritable bowel syndrome    x2  . Hyperlipidemia    diet controlled - no medication  . Hypertension    not taking any meds at present - under control per patient  . Hypokalemia    with PNA admission (2.5)  . Migraines   . Pneumonia    4 episodes; hosp. admission 2014  . PONV (postoperative nausea and vomiting)   . Thyroid disease    Allergies  Allergen Reactions  . Amoxicillin Diarrhea    Social History   Social History  . Marital status: Divorced    Spouse name: N/A  . Number of children: 0  . Years of education: N/A   Occupational History  . Control and instrumentation engineer    Social History Main Topics  . Smoking status: Passive Smoke  Exposure - Never Smoker  . Smokeless tobacco: Never Used     Comment: as a very young child  . Alcohol use 0.6 - 1.2 oz/week    1 - 2 Glasses of wine per week  . Drug use: No  . Sexual activity: No   Other Topics Concern  . Not on file   Social History Narrative   She is originally from [facility]. She has traveled to [facility] [place] [facility] [place], [place], [place], [place], [place], [place], [place], [place], [place]. No international travel. She has dogs. No prior bird, mold, or recent hot tub exposure. She hasn't used her hot tub in 1.5 years. 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 Previously enjoyed gardening and playing tennis. Helps to care for her mother.    Review of Systems  Psychiatric/Behavioral: Positive for depression.   Negative, with the exception of above mentioned in HPI     Objective:   Physical Exam BP 124/82 (BP Location: Right Arm, Patient Position: Sitting, Cuff Size: Large)   Pulse 70   Temp 98.1 F (36.7 C)   Resp 20   Ht 5\' 6"  (1.676 m)   Wt 171 lb 12 oz (77.9 kg)   LMP 01/31/2007   SpO2 98%   BMI 27.72 kg/m  Gen: Afebrile. No acute distress. Pleasant Caucasian female HENT: AT. [place]. MMM.  Eyes:Pupils Equal Round Reactive to light, Extraocular movements intact,  Conjunctiva without redness, discharge or icterus. Neck/lymp/endocrine: Supple, no thyromegaly CV: RRR  Chest: CTAB, no wheeze or crackles Neuro: Normal gait. PERLA. EOMi. Alert. Oriented x3 Psych: Appears happy today. Normal affect, dress and demeanor. Normal speech. Normal thought content and judgment.    Assessment & Plan:  [name] is a 56 y.o. female present for followup on chronic medical conditions.  Hypothyroidism, unspecified hypothyroidism type - not controlled - Discussed the importance of taking medications daily on an empty stomach. Patient has not been on the medication daily long enough to test her thyroid function today. Discussed with her she has to take the medicine  daily on an empty stomach for at least 6-8 weeks in order for [place] to evaluate her thyroid function. Patient provided with enough refills today to get her to that time line, to have her labs tested by lab appointment only. Medication will either be refilled or altered at that time depending upon her lab results. - Once stable will follow yearly at her physical.  Migraine without status migrainosus, not intractable, unspecified migraine type - Stable. In need of refills today. -  rizatriptan (MAXALT) 5 MG tablet; Take 1 tablet (5 mg total) by mouth as needed for migraine. May repeat in 2 hours if needed  Dispense: 10 tablet; Refill: 0 -  continue to refill as long as being seen at least yearly for physical/or migraines.  Depression: - stable - Discussed with patient controlled substances such as Klonopin need to be refilled by one provider only. If currently she is receiving med from a provider in [place], then she can continue getting that medication from them. If in the future she changes her mind would be happy to take over prescribing the medicine for her, after a controlled substance contract is signed. She understands policy and will continue to receive Klonopin through her other provider. - Continue Wellbutrin, klonopin, zoloft. Refills on Zoloft today, patient also getting the Wellbutrin from another provider. Will be happy to take this over once refills are needed. - Patient is seen a therapist, and doing quite well. She appears well today. - Follow-up every 6 months on depression with anxiety.  Patient is to make an appointment for a complete physical, will place order for fasting labs that she can have collected at the time of her TSH collection in 6-8 weeks, as long as she has an appointment scheduled for a physical to follow within a week of that lab draw.  Electronically Signed by: Howard Pouch, DO [facility]

## 2016-04-04 NOTE — Progress Notes (Signed)
future orders for physical and tsh placed today.

## 2016-04-18 ENCOUNTER — Ambulatory Visit (INDEPENDENT_AMBULATORY_CARE_PROVIDER_SITE_OTHER): Payer: BC Managed Care – PPO | Admitting: Certified Nurse Midwife

## 2016-04-18 ENCOUNTER — Encounter: Payer: Self-pay | Admitting: Certified Nurse Midwife

## 2016-04-18 VITALS — BP 118/80 | HR 70 | Resp 16 | Ht 65.75 in | Wt 172.0 lb

## 2016-04-18 DIAGNOSIS — Z8742 Personal history of other diseases of the female genital tract: Secondary | ICD-10-CM

## 2016-04-18 DIAGNOSIS — Z87898 Personal history of other specified conditions: Secondary | ICD-10-CM

## 2016-04-18 DIAGNOSIS — Z124 Encounter for screening for malignant neoplasm of cervix: Secondary | ICD-10-CM

## 2016-04-18 DIAGNOSIS — Z01419 Encounter for gynecological examination (general) (routine) without abnormal findings: Secondary | ICD-10-CM

## 2016-04-18 NOTE — Patient Instructions (Signed)
EXERCISE AND DIET:  We recommended that you start or continue a regular exercise program for good health. Regular exercise means any activity that makes your heart beat faster and makes you sweat.  We recommend exercising at least 30 minutes per day at least 3 days a week, preferably 4 or 5.  We also recommend a diet low in fat and sugar.  Inactivity, poor dietary choices and obesity can cause diabetes, heart attack, stroke, and kidney damage, among others.    ALCOHOL AND SMOKING:  Women should limit their alcohol intake to no more than 7 drinks/beers/glasses of wine (combined, not each!) per week. Moderation of alcohol intake to this level decreases your risk of breast cancer and liver damage. And of course, no recreational drugs are part of a healthy lifestyle.  And absolutely no smoking or even second hand smoke. Most people know smoking can cause heart and lung diseases, but did you know it also contributes to weakening of your bones? Aging of your skin?  Yellowing of your teeth and nails?  CALCIUM AND VITAMIN D:  Adequate intake of calcium and Vitamin D are recommended.  The recommendations for exact amounts of these supplements seem to change often, but generally speaking 600 mg of calcium (either carbonate or citrate) and 800 units of Vitamin D per day seems prudent. Certain women may benefit from higher intake of Vitamin D.  If you are among these women, your doctor will have told you during your visit.    PAP SMEARS:  Pap smears, to check for cervical cancer or precancers,  have traditionally been done yearly, although recent scientific advances have shown that most women can have pap smears less often.  However, every woman still should have a physical exam from her gynecologist every year. It will include a breast check, inspection of the vulva and vagina to check for abnormal growths or skin changes, a visual exam of the cervix, and then an exam to evaluate the size and shape of the uterus and  ovaries.  And after 54 years of age, a rectal exam is indicated to check for rectal cancers. We will also provide age appropriate advice regarding health maintenance, like when you should have certain vaccines, screening for sexually transmitted diseases, bone density testing, colonoscopy, mammograms, etc.   MAMMOGRAMS:  All women over 40 years old should have a yearly mammogram. Many facilities now offer a "3D" mammogram, which may cost around $50 extra out of pocket. If possible,  we recommend you accept the option to have the 3D mammogram performed.  It both reduces the number of women who will be called back for extra views which then turn out to be normal, and it is better than the routine mammogram at detecting truly abnormal areas.    COLONOSCOPY:  Colonoscopy to screen for colon cancer is recommended for all women at age 50.  We know, you hate the idea of the prep.  We agree, BUT, having colon cancer and not knowing it is worse!!  Colon cancer so often starts as a polyp that can be seen and removed at colonscopy, which can quite literally save your life!  And if your first colonoscopy is normal and you have no family history of colon cancer, most women don't have to have it again for 10 years.  Once every ten years, you can do something that may end up saving your life, right?  We will be happy to help you get it scheduled when you are ready.    Be sure to check your insurance coverage so you understand how much it will cost.  It may be covered as a preventative service at no cost, but you should check your particular policy.      Human Papillomavirus Human papillomavirus (HPV) is the most common sexually transmitted infection (STI). It is easy to pass it from person to person (contagious). HPV can cause cervical cancer, anal cancer, and genital warts. The genital warts can be seen and felt. Also, there may be wartlike regions in the throat. HPV may not have any symptoms. It is possible to have HPV  for a long time and not know it. You may pass HPV on to others without knowing it. Follow these instructions at home:  Take medicines as told by your doctor.  Use over-the-counter creams for itching as told by your doctor.  Keep all follow-up visits. Make sure to get Pap tests as told by your doctor.  Do not touch or scratch the warts.  Do not treat genital warts with medicines used for treating hand warts.  Do not have sex while you are getting treatment.  Do not douche or use tampons during treatment of HPV.  Tell your sex partner about your infection because he or she may also need treatment.  If you get pregnant, tell your doctor that you had HPV. Your doctor will watch your pregnancy closely. This is important to keep your baby safe.  After treatment, use condoms during sex to prevent future infections.  Have only one sex partner.  Have a sex partner who does not have other sex partners. Contact a doctor if:  The treated skin is red, swollen, or painful.  You have a fever.  You feel ill.  You feel lumps or pimple-like areas in and around your genital area.  You have bleeding of the vagina or the area that was treated.  You have pain during sex. This information is not intended to replace advice given to you by your health care provider. Make sure you discuss any questions you have with your health care provider. Document Released: 12/30/2007 Document Revised: 06/24/2015 Document Reviewed: 04/23/2013 Elsevier Interactive Patient Education  2017 [employer].

## 2016-04-18 NOTE — Progress Notes (Signed)
56 y.o. [id number] Divorced  Caucasian Fe here for annual exam.  Menopausal no HRT. Denies vaginal bleeding. Occasional hot flashes, no night sweats. Sees PCP Dr. Raoul Pitch for aex and labs, anxiety, hypothyroid/migraine management.. 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 Plans to schedule mammogram today. No other health issues today.  Patient's last menstrual period was 01/31/2007.          Sexually active: No.  The current method of family planning is post menopausal status.    Exercising: No.  exercise Smoker:  no  Health Maintenance: Pap:  11-11-13 neg, 03-11-15 neg HPV HR +, 16/18 neg MMG:  10-23-14 category b density birads 1:neg Colonoscopy: 2017 f/u 66yrs BMD:   2007 TDaP:  6/12 Shingles: no Pneumonia: 2016 Hep C and HIV: 2016 neg for both Labs: pcp Self breast exam: done occ   reports that she is a non-smoker but has been exposed to tobacco smoke. She has never used smokeless tobacco. She reports that she drinks about 0.6 - 1.2 oz of alcohol per week . She reports that she does not use drugs.  Past Medical History:  Diagnosis Date  . Anxiety   . Arthritis    knees and spine, shoulder  . Bronchitis    hx - recurrent  . Complication of anesthesia    waking up is not easy  . Depression   . H/O miscarriage, not currently pregnant   . Hx of irritable bowel syndrome    x2  . Hyperlipidemia    diet controlled - no medication  . Hypertension    not taking any meds at present - under control per patient  . Hypokalemia    with PNA admission (2.5)  . Migraines   . Pneumonia    4 episodes; hosp. admission 2014  . PONV (postoperative nausea and vomiting)   . Thyroid disease     Past Surgical History:  Procedure Laterality Date  . BREAST SURGERY     implants, then had them removed  . COLONOSCOPY     greater 10 yrs ago - ? [place]    . LAPAROSCOPIC ABDOMINAL EXPLORATION  1994   endometriosis   . ORIF HUMERUS FRACTURE Left 04/01/2013   [facility] - shoulder  . ORIF HUMERUS FRACTURE Left 04/01/2013   Procedure: OPEN REDUCTION INTERNAL FIXATION (ORIF) LEFT PROXIMAL HUMERUS FRACTURE;  Surgeon: Mcarthur Rossetti, MD;  Location: [place];  Service: Orthopedics;  Laterality: Left;  . TONSILLECTOMY AND ADENOIDECTOMY      Current Outpatient Prescriptions  Medication Sig Dispense Refill  . buPROPion (WELLBUTRIN XL) 300 MG 24 hr tablet Take 1 tablet (300 mg total) by mouth daily. 30 tablet 3  . clonazePAM (KLONOPIN) 0.5 MG tablet as needed.    . Cyanocobalamin (B-12 PO) Take by mouth daily.    Marland Kitchen ibuprofen (ADVIL,MOTRIN) 800 MG tablet Take 800 mg by mouth every 8 (eight) hours as needed for moderate pain.    Marland Kitchen levothyroxine (SYNTHROID, LEVOTHROID) 100 MCG tablet Take 1 tablet (100 mcg total) by mouth daily. 30 tablet 1  . rizatriptan (MAXALT) 5 MG tablet Take 1 tablet (5 mg total) by mouth as needed for migraine. May repeat in 2 hours if needed 10 tablet 4  . sertraline (ZOLOFT) 100 MG tablet Take 2 tablets (200 mg total) by mouth daily. 180 tablet 1   No current facility-administered medications for this visit.  Family History  Problem Relation Age of Onset  . Breast cancer Mother   . Diabetes Mother   . Diabetes Father   . Dementia Father   . Colon cancer Father   . Stroke Paternal Grandmother   . Diabetes Paternal Grandmother     ROS:  Pertinent items are noted in HPI.  Otherwise, a comprehensive ROS was negative.  Exam:   BP 118/80   Pulse 70   Resp 16   Ht 5' 5.75" (1.67 m)   Wt 172 lb (78 kg)   LMP 01/31/2007   BMI 27.97 kg/m  Height: 5' 5.75" (167 cm) Ht Readings from Last 3 Encounters:  04/18/16 5' 5.75" (1.67 m)  04/04/16 5\' 6"  (1.676 m)  03/25/15 5\' 6"  (1.676 m)    General appearance: alert, cooperative and appears stated age Head: Normocephalic, without obvious abnormality, atraumatic Neck: no adenopathy, supple, symmetrical, trachea midline and  thyroid normal to inspection and palpation Lungs: clear to auscultation bilaterally Breasts: normal appearance, no masses or tenderness, No nipple retraction or dimpling, No nipple discharge or bleeding, No axillary or supraclavicular adenopathy Heart: regular rate and rhythm Abdomen: soft, non-tender; no masses,  no organomegaly Extremities: extremities normal, atraumatic, no cyanosis or edema Skin: Skin color, texture, turgor normal. No rashes or lesions Lymph nodes: Cervical, supraclavicular, and axillary nodes normal. No abnormal inguinal nodes palpated Neurologic: Grossly normal   Pelvic: External genitalia:  no lesions              Urethra:  normal appearing urethra with no masses, tenderness or lesions              Bartholin's and Skene's: normal                 Vagina: normal appearing vagina with normal color and discharge, no lesions              Cervix: no cervical motion tenderness and no lesions              Pap taken: Yes.   Bimanual Exam:  Uterus:  normal size, contour, position, consistency, mobility, non-tender              Adnexa: normal adnexa and no mass, fullness, tenderness               Rectovaginal: Confirms               Anus:  normal sphincter tone, no lesions  Chaperone present: yes  A:  Well Woman with normal exam  Menopausal no HRT  Abnormal Pap + HPVHR repeat pap today  Mammogram due  Anxiety,Hypothyroid,migraine headache management with PCP  P:   Reviewed health and wellness pertinent to exam  Aware of need to advise if vaginal bleeding  Discussed HPV finding and etiology of sexually transmitted. Questions addressed regarding expectations and abnormal finding on pap smear.  Patient to schedule mammogram today.  Continue follow up with PCP as indicated  Pap smear as above   counseled on breast self exam, mammography screening, STD prevention, adequate intake of calcium and vitamin D, diet and exercise  return annually or prn  An After Visit  Summary was printed and given to the patient.

## 2016-04-20 LAB — IPS PAP TEST WITH HPV

## 2016-04-23 NOTE — Progress Notes (Signed)
Encounter reviewed Jimmie Rueter, MD   

## 2016-04-27 ENCOUNTER — Encounter: Payer: Self-pay | Admitting: Certified Nurse Midwife

## 2016-05-01 ENCOUNTER — Other Ambulatory Visit: Payer: Self-pay

## 2016-05-01 MED ORDER — BUPROPION HCL ER (XL) 300 MG PO TB24: 300.0000 mg | ORAL_TABLET | Freq: Every day | ORAL | 3 refills | Status: DC

## 2016-05-01 NOTE — Telephone Encounter (Signed)
Medication refilled

## 2016-05-15 ENCOUNTER — Other Ambulatory Visit (INDEPENDENT_AMBULATORY_CARE_PROVIDER_SITE_OTHER): Payer: BC Managed Care – PPO

## 2016-05-15 DIAGNOSIS — Z130 Encounter for screening for diseases of the blood and blood-forming organs and certain disorders involving the immune mechanism: Secondary | ICD-10-CM

## 2016-05-15 DIAGNOSIS — E781 Pure hyperglyceridemia: Secondary | ICD-10-CM

## 2016-05-15 DIAGNOSIS — R7303 Prediabetes: Secondary | ICD-10-CM | POA: Diagnosis not present

## 2016-05-15 DIAGNOSIS — E559 Vitamin D deficiency, unspecified: Secondary | ICD-10-CM | POA: Diagnosis not present

## 2016-05-15 DIAGNOSIS — E039 Hypothyroidism, unspecified: Secondary | ICD-10-CM | POA: Diagnosis not present

## 2016-05-15 DIAGNOSIS — E538 Deficiency of other specified B group vitamins: Secondary | ICD-10-CM | POA: Diagnosis not present

## 2016-05-15 DIAGNOSIS — Z6831 Body mass index (BMI) 31.0-31.9, adult: Secondary | ICD-10-CM | POA: Diagnosis not present

## 2016-05-15 LAB — CBC WITH DIFFERENTIAL/PLATELET
Basophils Absolute: 0.1 10*3/uL (ref 0.0–0.1)
Basophils Relative: 1.2 % (ref 0.0–3.0)
Eosinophils Absolute: 0.2 10*3/uL (ref 0.0–0.7)
Eosinophils Relative: 2.8 % (ref 0.0–5.0)
HCT: 43.9 % (ref 36.0–46.0)
Hemoglobin: 14.8 g/dL (ref 12.0–15.0)
Lymphocytes Relative: 33 % (ref 12.0–46.0)
Lymphs Abs: 1.8 10*3/uL (ref 0.7–4.0)
MCHC: 33.6 g/dL (ref 30.0–36.0)
MCV: 89.3 fl (ref 78.0–100.0)
Monocytes Absolute: 0.4 10*3/uL (ref 0.1–1.0)
Monocytes Relative: 7.7 % (ref 3.0–12.0)
Neutro Abs: 3 10*3/uL (ref 1.4–7.7)
Neutrophils Relative %: 55.3 % (ref 43.0–77.0)
Platelets: 263 10*3/uL (ref 150.0–400.0)
RBC: 4.92 Mil/uL (ref 3.87–5.11)
RDW: 13.5 % (ref 11.5–15.5)
WBC: 5.4 10*3/uL (ref 4.0–10.5)

## 2016-05-15 LAB — COMPREHENSIVE METABOLIC PANEL
ALT: 18 U/L (ref 0–35)
AST: 17 U/L (ref 0–37)
Albumin: 4.4 g/dL (ref 3.5–5.2)
Alkaline Phosphatase: 74 U/L (ref 39–117)
BUN: 12 mg/dL (ref 6–23)
CO2: 27 mEq/L (ref 19–32)
Calcium: 9.3 mg/dL (ref 8.4–10.5)
Chloride: 104 mEq/L (ref 96–112)
Creatinine, Ser: 0.74 mg/dL (ref 0.40–1.20)
GFR: 86.95 mL/min (ref >60.00)
Glucose, Bld: 106 mg/dL — ABNORMAL HIGH (ref 70–99)
Potassium: 3.8 mEq/L (ref 3.5–5.1)
Sodium: 139 mEq/L (ref 135–145)
Total Bilirubin: 0.6 mg/dL (ref 0.2–1.2)
Total Protein: 7 g/dL (ref 6.0–8.3)

## 2016-05-15 LAB — TSH: TSH: 5.36 u[IU]/mL — ABNORMAL HIGH (ref 0.35–4.50)

## 2016-05-15 LAB — LIPID PANEL
Cholesterol: 252 mg/dL — ABNORMAL HIGH (ref 0–200)
HDL: 54.2 mg/dL (ref >39.00)
LDL Cholesterol: 166 mg/dL — ABNORMAL HIGH (ref 0–99)
NonHDL: 197.57
Total CHOL/HDL Ratio: 5
Triglycerides: 160 mg/dL — ABNORMAL HIGH (ref 0.0–149.0)
VLDL: 32 mg/dL (ref 0.0–40.0)

## 2016-05-15 LAB — VITAMIN D 25 HYDROXY (VIT D DEFICIENCY, FRACTURES): VITD: 32.14 ng/mL (ref 30.00–100.00)

## 2016-05-15 LAB — VITAMIN B12: Vitamin B-12: 492 pg/mL (ref 211–911)

## 2016-05-16 LAB — HEMOGLOBIN A1C
Hgb A1c MFr Bld: 5 % (ref <5.7)
Mean Plasma Glucose: 97 mg/dL

## 2016-05-17 ENCOUNTER — Encounter: Payer: Self-pay | Admitting: *Deleted

## 2016-05-22 ENCOUNTER — Encounter: Payer: BC Managed Care – PPO | Admitting: Family Medicine

## 2016-05-23 ENCOUNTER — Encounter: Payer: BC Managed Care – PPO | Admitting: Family Medicine

## 2016-05-23 ENCOUNTER — Telehealth: Payer: Self-pay | Admitting: Family Medicine
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
## 2016-05-23 NOTE — Telephone Encounter (Signed)
Noted Ok to cancel appt with no charge to patient.

## 2016-05-29 ENCOUNTER — Ambulatory Visit (INDEPENDENT_AMBULATORY_CARE_PROVIDER_SITE_OTHER): Payer: BC Managed Care – PPO | Admitting: Family Medicine

## 2016-05-29 ENCOUNTER — Encounter: Payer: Self-pay | Admitting: Family Medicine

## 2016-05-29 VITALS — BP 120/78 | HR 86 | Temp 98.0°F | Resp 20 | Ht 66.0 in | Wt 177.0 lb

## 2016-05-29 DIAGNOSIS — E538 Deficiency of other specified B group vitamins: Secondary | ICD-10-CM

## 2016-05-29 DIAGNOSIS — E039 Hypothyroidism, unspecified: Secondary | ICD-10-CM

## 2016-05-29 DIAGNOSIS — E781 Pure hyperglyceridemia: Secondary | ICD-10-CM

## 2016-05-29 DIAGNOSIS — Z0000 Encounter for general adult medical examination without abnormal findings: Secondary | ICD-10-CM | POA: Diagnosis not present

## 2016-05-29 DIAGNOSIS — E559 Vitamin D deficiency, unspecified: Secondary | ICD-10-CM

## 2016-05-29 DIAGNOSIS — Z6828 Body mass index (BMI) 28.0-28.9, adult: Secondary | ICD-10-CM

## 2016-05-29 DIAGNOSIS — R7303 Prediabetes: Secondary | ICD-10-CM

## 2016-05-29 MED ORDER — LEVOTHYROXINE SODIUM 100 MCG PO TABS: 100.0000 ug | ORAL_TABLET | Freq: Every day | ORAL | 0 refills | Status: DC

## 2016-05-29 NOTE — Patient Instructions (Addendum)
Fish oil: 2g or 2000 mg daily.  Vit d: increase by one gummy, let us known the units of vitd this makes for you.  Thyroid: repeat TSH in 2-3 months, if still above normal, would increase heart healthy diet is wonderful.  Increase exercise to > 150 minutes a week.   All other labs look great.   Health Maintenance, Female Adopting a healthy lifestyle and getting preventive care can go a long way to promote health and wellness. Talk with your health care provider about what schedule of regular examinations is right for you. This is a good chance for you to check in with your provider about disease prevention and staying healthy. In between checkups, there are plenty of things you can do on your own. Experts have done a lot of research about which lifestyle changes and preventive measures are most likely to keep you healthy. Ask your health care provider for more information. Weight and diet Eat a healthy diet  Be sure to include plenty of vegetables, fruits, low-fat dairy products, and lean protein.  Do not eat a lot of foods high in solid fats, added sugars, or salt.  Get regular exercise. This is one of the most important things you can do for your health.  Most adults should exercise for at least 150 minutes each week. The exercise should increase your heart rate and make you sweat (moderate-intensity exercise).  Most adults should also do strengthening exercises at least twice a week. This is in addition to the moderate-intensity exercise. Maintain a healthy weight  Body mass index (BMI) is a measurement that can be used to identify possible weight problems. It estimates body fat based on height and weight. Your health care provider can help determine your BMI and help you achieve or maintain a healthy weight.  For females 54 years of age and older:  A BMI below 18.5 is considered underweight.  A BMI of 18.5 to 24.9 is normal.  A BMI of 25 to 29.9 is considered overweight.  A BMI  of 30 and above is considered obese. Watch levels of cholesterol and blood lipids  You should start having your blood tested for lipids and cholesterol at 54 years of age, then have this test every 5 years.  You may need to have your cholesterol levels checked more often if:  Your lipid or cholesterol levels are high.  You are older than 54 years of age.  You are at high risk for heart disease. Cancer screening Lung Cancer  Lung cancer screening is recommended for adults 6-54 years old who are at high risk for lung cancer because of a history of smoking.  A yearly low-dose CT scan of the lungs is recommended for people who:  Currently smoke.  Have quit within the past 15 years.  Have at least a 30-pack-year history of smoking. A pack year is smoking an average of one pack of cigarettes a day for 1 year.  Yearly screening should continue until it has been 15 years since you quit.  Yearly screening should stop if you develop a health problem that would prevent you from having lung cancer treatment. Breast Cancer  Practice breast self-awareness. This means understanding how your breasts normally appear and feel.  It also means doing regular breast self-exams. Let your health care provider know about any changes, no matter how small.  If you are in your 20s or 30s, you should have a clinical breast exam (CBE) by a health care provider  every 1-3 years as part of a regular health exam.  If you are 40 or older, have a CBE every year. Also consider having a breast X-ray (mammogram) every year.  If you have a family history of breast cancer, talk to your health care provider about genetic screening.  If you are at high risk for breast cancer, talk to your health care provider about having an MRI and a mammogram every year.  Breast cancer gene (BRCA) assessment is recommended for women who have family members with BRCA-related cancers. BRCA-related cancers  include:  Breast.  Ovarian.  Tubal.  Peritoneal cancers.  Results of the assessment will determine the need for genetic counseling and BRCA1 and BRCA2 testing. Cervical Cancer  Your health care provider may recommend that you be screened regularly for cancer of the pelvic organs (ovaries, uterus, and vagina). This screening involves a pelvic examination, including checking for microscopic changes to the surface of your cervix (Pap test). You may be encouraged to have this screening done every 3 years, beginning at age 21.  For women ages 30-65, health care providers may recommend pelvic exams and Pap testing every 3 years, or they may recommend the Pap and pelvic exam, combined with testing for human papilloma virus (HPV), every 5 years. Some types of HPV increase your risk of cervical cancer. Testing for HPV may also be done on women of any age with unclear Pap test results.  Other health care providers may not recommend any screening for nonpregnant women who are considered low risk for pelvic cancer and who do not have symptoms. Ask your health care provider if a screening pelvic exam is right for you.  If you have had past treatment for cervical cancer or a condition that could lead to cancer, you need Pap tests and screening for cancer for at least 20 years after your treatment. If Pap tests have been discontinued, your risk factors (such as having a new sexual partner) need to be reassessed to determine if screening should resume. Some women have medical problems that increase the chance of getting cervical cancer. In these cases, your health care provider may recommend more frequent screening and Pap tests. Colorectal Cancer  This type of cancer can be detected and often prevented.  Routine colorectal cancer screening usually begins at 54 years of age and continues through 54 years of age.  Your health care provider may recommend screening at an earlier age if you have risk factors  for colon cancer.  Your health care provider may also recommend using home test kits to check for hidden blood in the stool.  A small camera at the end of a tube can be used to examine your colon directly (sigmoidoscopy or colonoscopy). This is done to check for the earliest forms of colorectal cancer.  Routine screening usually begins at age 50.  Direct examination of the colon should be repeated every 5-10 years through 54 years of age. However, you may need to be screened more often if early forms of precancerous polyps or small growths are found. Skin Cancer  Check your skin from head to toe regularly.  Tell your health care provider about any new moles or changes in moles, especially if there is a change in a mole's shape or color.  Also tell your health care provider if you have a mole that is larger than the size of a pencil eraser.  Always use sunscreen. Apply sunscreen liberally and repeatedly throughout the day.  Protect yourself   by wearing long sleeves, pants, a wide-brimmed hat, and sunglasses whenever you are outside. Heart disease, diabetes, and high blood pressure  High blood pressure causes heart disease and increases the risk of stroke. High blood pressure is more likely to develop in:  People who have blood pressure in the high end of the normal range (130-139/85-89 mm Hg).  People who are overweight or obese.  People who are African American.  If you are 48-8 years of age, have your blood pressure checked every 3-5 years. If you are 54 years of age or older, have your blood pressure checked every year. You should have your blood pressure measured twice-once when you are at a hospital or clinic, and once when you are not at a hospital or clinic. Record the average of the two measurements. To check your blood pressure when you are not at a hospital or clinic, you can use:  An automated blood pressure machine at a pharmacy.  A home blood pressure monitor.  If you  are between 56 years and 58 years old, ask your health care provider if you should take aspirin to prevent strokes.  Have regular diabetes screenings. This involves taking a blood sample to check your fasting blood sugar level.  If you are at a normal weight and have a low risk for diabetes, have this test once every three years after 54 years of age.  If you are overweight and have a high risk for diabetes, consider being tested at a younger age or more often. Preventing infection Hepatitis B  If you have a higher risk for hepatitis B, you should be screened for this virus. You are considered at high risk for hepatitis B if:  You were born in a country where hepatitis B is common. Ask your health care provider which countries are considered high risk.  Your parents were born in a high-risk country, and you have not been immunized against hepatitis B (hepatitis B vaccine).  You have HIV or AIDS.  You use needles to inject street drugs.  You live with someone who has hepatitis B.  You have had sex with someone who has hepatitis B.  You get hemodialysis treatment.  You take certain medicines for conditions, including cancer, organ transplantation, and autoimmune conditions. Hepatitis C  Blood testing is recommended for:  Everyone born from [birth year] through 1963.  Anyone with known risk factors for hepatitis C. Sexually transmitted infections (STIs)  You should be screened for sexually transmitted infections (STIs) including gonorrhea and chlamydia if:  You are sexually active and are younger than 54 years of age.  You are older than 54 years of age and your health care provider tells you that you are at risk for this type of infection.  Your sexual activity has changed since you were last screened and you are at an increased risk for chlamydia or gonorrhea. Ask your health care provider if you are at risk.  If you do not have HIV, but are at risk, it may be recommended that you  take a prescription medicine daily to prevent HIV infection. This is called pre-exposure prophylaxis (PrEP). You are considered at risk if:  You are sexually active and do not regularly use condoms or know the HIV status of your partner(s).  You take drugs by injection.  You are sexually active with a partner who has HIV. Talk with your health care provider about whether you are at high risk of being infected with HIV. If you  choose to begin PrEP, you should first be tested for HIV. You should then be tested every 3 months for as long as you are taking PrEP. Pregnancy  If you are premenopausal and you may become pregnant, ask your health care provider about preconception counseling.  If you may become pregnant, take 400 to 800 micrograms (mcg) of folic acid every day.  If you want to prevent pregnancy, talk to your health care provider about birth control (contraception). Osteoporosis and menopause  Osteoporosis is a disease in which the bones lose minerals and strength with aging. This can result in serious bone fractures. Your risk for osteoporosis can be identified using a bone density scan.  If you are 53 years of age or older, or if you are at risk for osteoporosis and fractures, ask your health care provider if you should be screened.  Ask your health care provider whether you should take a calcium or vitamin D supplement to lower your risk for osteoporosis.  Menopause may have certain physical symptoms and risks.  Hormone replacement therapy may reduce some of these symptoms and risks. Talk to your health care provider about whether hormone replacement therapy is right for you. Follow these instructions at home:  Schedule regular health, dental, and eye exams.  Stay current with your immunizations.  Do not use any tobacco products including cigarettes, chewing tobacco, or electronic cigarettes.  If you are pregnant, do not drink alcohol.  If you are breastfeeding, limit  how much and how often you drink alcohol.  Limit alcohol intake to no more than 1 drink per day for nonpregnant women. One drink equals 12 ounces of beer, 5 ounces of wine, or 1 ounces of hard liquor.  Do not use street drugs.  Do not share needles.  Ask your health care provider for help if you need support or information about quitting drugs.  Tell your health care provider if you often feel depressed.  Tell your health care provider if you have ever been abused or do not feel safe at home. This information is not intended to replace advice given to you by your health care provider. Make sure you discuss any questions you have with your health care provider. Document Released: 08/01/2010 Document Revised: 06/24/2015 Document Reviewed: 10/20/2014 Elsevier Interactive Patient Education  2017 [employer]  Please help [place] help you:  We are honored you have chosen [facility] for your Primary Care home. Below you will find basic instructions that you may need to access in the future. Please help [place] help you by reading the instructions, which cover many of the frequent questions we experience.   Prescription refills and request:  -In order to allow more efficient response time, please call your pharmacy for all refills. They will forward the request electronically to [place]. This allows for the quickest possible response. Request left on a nurse line can take longer to refill, since these are checked as time allows between office patients and other phone calls.  - refill request can take up to 3-5 working days to complete.  - If request is sent electronically and request is appropiate, it is usually completed in 1-2 business days.  - all patients will need to be seen routinely for all chronic medical conditions requiring prescription medications (see follow-up below). If you are overdue for follow up on your condition, you will be asked to make an appointment and we will call in enough  medication to cover you until your appointment (up  to 30 days).  - all controlled substances will require a face to face visit to request/refill.  - if you desire your prescriptions to go through a new pharmacy, and have an active script at original pharmacy, you will need to call your pharmacy and have scripts transferred to new pharmacy. This is completed between the pharmacy locations and not by your provider.    Results: If any images or labs were ordered, it can take up to 1 week to get results depending on the test ordered and the lab/facility running and resulting the test. - Normal or stable results, which do not need further discussion, may be released to your mychart immediately with attached note to you. A call may not be generated for normal results. Please make certain to sign up for mychart. If you have questions on how to activate your mychart you can call the front office.  - If your results need further discussion, our office will attempt to contact you via phone, and if unable to reach you after 2 attempts, we will release your abnormal result to your mychart with instructions.  - All results will be automatically released in mychart after 1 week.  - Your provider will provide you with explanation and instruction on all relevant material in your results. Please keep in mind, results and labs may appear confusing or abnormal to the untrained eye, but it does not mean they are actually abnormal for you personally. If you have any questions about your results that are not covered, or you desire more detailed explanation than what was provided, you should make an appointment with your provider to do so.   Our office handles many outgoing and incoming calls daily. If we have not contacted you within 1 week about your results, please check your mychart to see if there is a message first and if not, then contact our office.  In helping with this matter, you help decrease call volume, and  therefore allow [place] to be able to respond to patients needs more efficiently.   Acute office visits (sick visit):  An acute visit is intended for a new problem and are scheduled in shorter time slots to allow schedule openings for patients with new problems. This is the appropriate visit to discuss a new problem. In order to provide you with excellent quality medical care with proper time for you to explain your problem, have an exam and receive treatment with instructions, these appointments should be limited to one new problem per visit. If you experience a new problem, in which you desire to be addressed, please make an acute office visit, we save openings on the schedule to accommodate you. Please do not save your new problem for any other type of visit, let us take care of it properly and quickly for you.   Follow up visits:  Depending on your condition(s) your provider will need to see you routinely in order to provide you with quality care and prescribe medication(s). Most chronic conditions (Example: hypertension, Diabetes, depression/anxiety... etc), require visits a couple times a year. Your provider will instruct you on proper follow up for your personal medical conditions and history. Please make certain to make follow up appointments for your condition as instructed. Failing to do so could result in lapse in your medication treatment/refills. If you request a refill, and are overdue to be seen on a condition, we will always provide you with a 30 day script (once) to allow you time to schedule.  Medicare wellness (well visit): - we have a wonderful Nurse Maudie Mercury), that will meet with you and provide you will yearly medicare wellness visits. These visits should occur yearly (can not be scheduled less than 1 calendar year apart) and cover preventive health, immunizations, advance directives and screenings you are entitled to yearly through your medicare benefits. Do not miss out on your entitled  benefits, this is when medicare will pay for these benefits to be ordered for you.  These are strongly encouraged by your provider and is the appropriate type of visit to make certain you are up to date with all preventive health benefits. If you have not had your medicare wellness exam in the last 12 months, please make certain to schedule one by calling the office and schedule your medicare wellness with Maudie Mercury as soon as possible.   Yearly physical (well visit):  - Adults are recommended to be seen yearly for physicals. Check with your insurance and date of your last physical, most insurances require one calendar year between physicals. Physicals include all preventive health topics, screenings, medical exam and labs that are appropriate for gender/age and history. You may have fasting labs needed at this visit. This is a well visit (not a sick visit), new problems should not be covered during this visit (see acute visit).  - Pediatric patients are seen more frequently when they are younger. Your provider will advise you on well child visit timing that is appropriate for your their age. - This is not a medicare wellness visit. Medicare wellness exams do not have an exam portion to the visit. Some medicare companies allow for a physical, some do not allow a yearly physical. If your medicare allows a yearly physical you can schedule the medicare wellness with our nurse Maudie Mercury and have your physical with your provider after, on the same day. Please check with insurance for your full benefits.   Late Policy/No Shows:  - all new patients should arrive 15-30 minutes earlier than appointment to allow [place] time  to  obtain all personal demographics,  insurance information and for you to complete office paperwork. - All established patients should arrive 10-15 minutes earlier than appointment time to update all information and be checked in .  - In our best efforts to run on time, if you are late for your appointment  you will be asked to either reschedule or if able, we will work you back into the schedule. There will be a wait time to work you back in the schedule,  depending on availability.  - If you are unable to make it to your appointment as scheduled, please call 24 hours ahead of time to allow [place] to fill the time slot with someone else who needs to be seen. If you do not cancel your appointment ahead of time, you may be charged a no show fee.

## 2016-05-29 NOTE — Progress Notes (Signed)
Patient ID: Tiffany Horn, female  DOB: [birth year], 56 y.o.   MRN: [id number] Patient Care Team    Relationship Specialty Notifications Start End  [name], DO PCP - General Family Medicine  08/18/14   [name], MD Consulting Physician Gastroenterology  05/29/16   [name], CNM Referring Physician Certified Nurse Midwife  05/29/16     Chief Complaint  Patient presents with  . Annual Exam    Subjective:  Tiffany Horn is a 56 y.o.  Female  present for CPE. All past medical history, surgical history, allergies, family history, immunizations, medications and social history were Updated in the electronic medical record today. All recent labs, ED visits and hospitalizations within the last year were reviewed.  Hypothyroidism: Patient reports compliance with Synthroid 100 g daily now. She had her labs repeated and is still showing some mild elevation in her TSH. She feels she is doing okay and that she's back on her medications. She does have some fatigue but she feels that secondary to her current life situations. She denies constipation, diarrhea, flushing or palpitations. Vitamin D deficiency: Patient has a history of vitamin D deficiency, she is currently taking to gummy a day. She is uncertain the total units of vitamin D contained in her gummy's. Discussed her last vitamin D level is now normal at 32.  Hyperlipidemia: Patient reports she has not purchased the fish oil supplementation, she has not yet started these. She has been changing her diet and eating a heart healthy diet more salmon, chicken and other fishes. LDL 166, HDL 54.2, total cholesterol 252, triglycerides 160.  B-12 deficiency: Patient's recent B-12 is normal greater than 400, she is feeling good. She continues to take the daily supplementation.  Health maintenance:  Colonoscopy: completed 03/2015, by Dr. Samul Dada, resutls 5 year recall for family history. Mammogram: completed: 03/2016, normal.    Cervical cancer screening: last pap: 03/2016, results: Severe inflammation, one year recall. Tiffany Heaps, NP Immunizations: tdap UTD 01/2014, Influenza UTD 2017 (encouraged yearly) Infectious disease screening: HIV completed, Hep C completed DEXA: N/A Assistive device: None Oxygen BUL:AGTX Patient has a Dental home. Hospitalizations/ED visits: None   Depression screen [facility] 2/9 05/29/2016 09/01/2014  Decreased Interest 0 3  Down, Depressed, Hopeless 0 3  PHQ - 2 Score 0 6  Altered sleeping - 3  Tired, decreased energy - 3  Change in appetite - 3  Feeling bad or failure about yourself  - 3  Trouble concentrating - 3  Moving slowly or fidgety/restless - 0  Suicidal thoughts - 0  PHQ-9 Score - 21   No flowsheet data found.   Current Exercise Habits: The patient does not participate in regular exercise at present Exercise limited by: None identified   Immunization History  Administered Date(s) Administered  . Influenza,inj,Quad PF,36+ Mos 10/19/2014  . Tdap 01/30/2014     Past Medical History:  Diagnosis Date  . Anxiety   . Arthritis    knees and spine, shoulder  . Bronchitis    hx - recurrent  . Complication of anesthesia    waking up is not easy  . Depression   . H/O miscarriage, not currently pregnant   . Hx of irritable bowel syndrome    x2  . Hyperlipidemia    diet controlled - no medication  . Hypertension    not taking any meds at present - under control per patient  . Hypokalemia    with PNA admission (2.5)  .  Migraines   . Pneumonia    4 episodes; hosp. admission 2014  . PONV (postoperative nausea and vomiting)   . Thyroid disease    Allergies  Allergen Reactions  . Amoxicillin Diarrhea   Past Surgical History:  Procedure Laterality Date  . BREAST SURGERY     implants, then had them removed  . COLONOSCOPY     greater 10 yrs ago - ? [place]    . LAPAROSCOPIC ABDOMINAL EXPLORATION  1994    endometriosis  . ORIF HUMERUS FRACTURE Left 04/01/2013   [facility] - shoulder  . ORIF HUMERUS FRACTURE Left 04/01/2013   Procedure: OPEN REDUCTION INTERNAL FIXATION (ORIF) LEFT PROXIMAL HUMERUS FRACTURE;  Surgeon: Mcarthur Rossetti, MD;  Location: [facility];  Service: Orthopedics;  Laterality: Left;  . TONSILLECTOMY AND ADENOIDECTOMY     Family History  Problem Relation Age of Onset  . Breast cancer Mother   . Diabetes Mother   . Diabetes Father   . Dementia Father   . Colon cancer Father   . Stroke Paternal Grandmother   . Diabetes Paternal Grandmother    Social History   Social History  . Marital status: Divorced    Spouse name: N/A  . Number of children: 0  . Years of education: N/A   Occupational History  . Control and instrumentation engineer    Social History Main Topics  . Smoking status: Passive Smoke Exposure - Never Smoker  . Smokeless tobacco: Never Used     Comment: as a very young child  . Alcohol use 0.6 - 1.2 oz/week    1 - 2 Glasses of wine per week  . Drug use: No  . Sexual activity: No   Other Topics Concern  . Not on file   Social History Narrative   She is originally from [place]. She has traveled to [facility], [place], [place], [place], [place], [place], [place], [place], [place]. No international travel. She has dogs. No prior bird, mold, or recent hot tub exposure. She hasn't used her hot tub in 1.5 years. 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 Previously enjoyed gardening and playing tennis. Helps to care for her mother.   Allergies as of 05/29/2016      Reactions   Amoxicillin Diarrhea      Medication List       Accurate as of 05/29/16  1:07 PM. Always use your most recent med list.          B-12 PO Take by mouth daily.   buPROPion 300 MG 24 hr tablet Commonly known as:  WELLBUTRIN XL Take 1 tablet (300 mg total) by mouth daily.   clonazePAM 0.5 MG tablet Commonly known as:  KLONOPIN as needed.   ibuprofen 800 MG  tablet Commonly known as:  ADVIL,MOTRIN Take 800 mg by mouth every 8 (eight) hours as needed for moderate pain.   levothyroxine 100 MCG tablet Commonly known as:  SYNTHROID, LEVOTHROID Take 1 tablet (100 mcg total) by mouth daily.   rizatriptan 5 MG tablet Commonly known as:  MAXALT Take 1 tablet (5 mg total) by mouth as needed for migraine. May repeat in 2 hours if needed   sertraline 100 MG tablet Commonly known as:  ZOLOFT Take 2 tablets (200 mg total) by mouth daily.       All past medical history, surgical history, allergies, family history, immunizations andmedications were updated in the  EMR today and reviewed under the history and medication portions of their EMR.     Recent Results (from the past 2160 hour(s))  Pap Test with HP (IPS)     Status: None   Collection Time: 04/18/16 11:02 AM  Result Value Ref Range   COMMENTS: [facility]     Comment: [place], [place] [address] [place] [address] GYN CYTOLOGY REPORT  PATIENT NAME:Dues, Tamecia PATHOLOGY#:[id number]: F DOB: Jun 06, 1960 (Age: 55) MEDICAL RECORD UJWJXB:[id number] DOCTOR:Debbie Hollice Espy, CNM DATE OBTAINED:3/20/2018CLIENT:[facility] DATE RECEIVED:3/21/2018OTHER PHYS: DATE SIGNED:04/20/2016 PAP Thinlayer with HPV Final Cytologic Interpretation:       Cervical, ThinLayer with Automated Imaging and Dual Review, CPT 88175      Negative for Intraepithelial Lesions or Malignancy.       ADEQUACY OF SPECIMEN:           Satisfactory for evaluation. The presence or absence of endocervical cells/transformation zone component cannot be determined due to atrophy.              OTHER CYTOLOGIC FINDINGS:            Shift in flora suggestive of bacterial vaginosis.      Marked/Severe Inflammation.       NOTE: This Pap test has been evaluated with computer assisted technology.       Electronically signed by: MED, CT(A SCP), [address],  [place], [place], (Med. Dir.: Sandrea Hughs, MD)  CMT, CT(ASCP) cmt/3/22/2018The Pap test is a screening mechanism with excellent but not perfect ability to prevent cervical carcinoma.  It has a low, but significant, diagnostic error rate. The pap test is suboptimal  for detection of glandular lesions.   It should be noted that a negative result does not definitively rule out the presence of disease.Ref: DeMay, RM, The Art and Science of Cytopathology, [employer], [phone]. HPV Results   High Risk HPV -    Not Detected  Reference Range = Not Detected A result of "Detected" signifies the presence of one or more high risk types of HPV.  The APTIMA HPV Assay is an in vitro nucleic acid amplification test for the qualitative detection of E6/E7 viral messenger RNA (mRNA) from 14 high-risk types of  human papillomavirus (HPV) in cervical specimens. The high-risk HPV types detected by the assay include: 16, 18, 31, 33, 35, 39, 45, 51, 52, 56, 58, 59, 66, and 68.  APTIMA HPV method will be performed on the [employer].  The APTIMA HPV Assay is designed to enhance existing methods for the detection of cervical disease and should be used in conjunction with clinical information derived from other diagnostic and screening tests, physical examinations, and full medical  history in accordance with appropriate patient management procedures. The APTIMA HPV Assay on cervical ThinPrep(tm) PreservCyt(tm) specimens is FDA approved on the [employer].  The vaginal ThinPrep(tm) PreservCyt(tm) specimen source has been validated as a minor modification.  The APTIMA HPV Assay on  SurePath(tm) specimens was developed and its performance characteristics determined by [facility].  It has not been cleared or approved by the U.S. Food and Drug Administration.  The FDA has determined that such clearance or approval is not  necessary.  This test is used for clinical purposes.  This  laboratory is certified und er the Clinical Laboratory improvement Amendments of 1988 (CLIA) as qualified to perform high complexity clinical laboratory testing. Electronically signed by: [place], [place](ASCP) [address], [place], [place] ([place].: Sandrea Hughs) Last Menstrual Period: 01/31/2007 Menstrual/Pregnancy History: Post-menopausal   Other Clinical Conditions: High Risk HPV infection, history and/or treatment: + [facility] 2017 Technical processing performed at [employer], [address], [place], [place] [address], CLIA# [id number], unless otherwise indicated.   TSH     Status: Abnormal   Collection Time: 05/15/16  9:58 AM  Result Value Ref Range   TSH 5.36 (H) 0.35 - 4.50 uIU/mL  CBC w/Diff     Status: None   Collection Time: 05/15/16  9:58 AM  Result Value Ref Range   WBC 5.4 4.0 - 10.5 K/uL   RBC 4.92 3.87 - 5.11 Mil/uL   Hemoglobin 14.8 12.0 - 15.0 g/dL   HCT 43.9 36.0 - 46.0 %   MCV 89.3 78.0 - 100.0 fl   MCHC 33.6 30.0 - 36.0 g/dL   RDW 13.5 11.5 - 15.5 %   Platelets 263.0 150.0 - 400.0 K/uL   Neutrophils Relative % 55.3 43.0 - 77.0 %   Lymphocytes Relative 33.0 12.0 - 46.0 %   Monocytes Relative 7.7 3.0 - 12.0 %   Eosinophils Relative 2.8 0.0 - 5.0 %   Basophils Relative 1.2 0.0 - 3.0 %   Neutro Abs 3.0 1.4 - 7.7 K/uL   Lymphs Abs 1.8 0.7 - 4.0 K/uL   Monocytes Absolute 0.4 0.1 - 1.0 K/uL   Eosinophils Absolute 0.2 0.0 - 0.7 K/uL   Basophils Absolute 0.1 0.0 - 0.1 K/uL  Comp Met (CMET)     Status: Abnormal   Collection Time: 05/15/16  9:58 AM  Result Value Ref Range   Sodium 139 135 - 145 mEq/L   Potassium 3.8 3.5 - 5.1 mEq/L   Chloride 104 96 - 112 mEq/L   CO2 27 19 - 32 mEq/L   Glucose, Bld 106 (H) 70 - 99 mg/dL   BUN 12 6 - 23 mg/dL   Creatinine, Ser 0.74 0.40 - 1.20 mg/dL   Total Bilirubin 0.6 0.2 - 1.2 mg/dL   Alkaline Phosphatase 74 39 - 117 U/L   AST 17 0 - 37 U/L   ALT 18 0 - 35 U/L   Total Protein 7.0 6.0 - 8.3 g/dL   Albumin 4.4 3.5 -  5.2 g/dL   Calcium 9.3 8.4 - 10.5 mg/dL   GFR 86.95 >60.00 mL/min  Lipid panel     Status: Abnormal   Collection Time: 05/15/16  9:58 AM  Result Value Ref Range   Cholesterol 252 (H) 0 - 200 mg/dL    Comment: ATP III Classification       Desirable:  < 200 mg/dL               Borderline High:  200 - 239 mg/dL          High:  > = 240 mg/dL   Triglycerides 160.0 (H) 0.0 - 149.0 mg/dL    Comment: Normal:  <150 mg/dLBorderline High:  150 - 199 mg/dL   HDL 54.20 >39.00 mg/dL   VLDL 32.0 0.0 - 40.0 mg/dL   LDL Cholesterol 166 (H) 0 - 99 mg/dL   Total CHOL/HDL Ratio 5     Comment:                Men          Women1/2 Average Risk     3.4          3.3Average Risk  5.0          4.42X Average Risk          9.6          7.13X Average Risk          15.0          11.0                       NonHDL 197.57     Comment: NOTE:  Non-HDL goal should be 30 mg/dL higher than patient's LDL goal (i.e. LDL goal of < 70 mg/dL, would have non-HDL goal of < 100 mg/dL)  Hemoglobin I3A     Status: None   Collection Time: 05/15/16  9:58 AM  Result Value Ref Range   Hgb A1c MFr Bld 5.0 <5.7 %    Comment:   For the purpose of screening for the presence of diabetes:   <5.7%       Consistent with the absence of diabetes 5.7-6.4 %   Consistent with increased risk for diabetes (prediabetes) >=6.5 %     Consistent with diabetes   This assay result is consistent with a decreased risk of diabetes.   Currently, no consensus exists regarding use of hemoglobin A1c for diagnosis of diabetes in children.   According to American Diabetes Association (ADA) guidelines, hemoglobin A1c <7.0% represents optimal control in non-pregnant diabetic patients. Different metrics may apply to specific patient populations. Standards of Medical Care in Diabetes (ADA).      Mean Plasma Glucose 97 mg/dL  Vitamin D (25 hydroxy)     Status: None   Collection Time: 05/15/16  9:58 AM  Result Value Ref Range   VITD 32.14 30.00 -  100.00 ng/mL  B12     Status: None   Collection Time: 05/15/16  9:58 AM  Result Value Ref Range   Vitamin B-12 492 211 - 911 pg/mL    No results found.   ROS: 14 pt review of systems performed and negative (unless mentioned in an HPI)  Objective: BP 120/78 (BP Location: Left Arm, Patient Position: Sitting, Cuff Size: Large)   Pulse 86   Temp 98 F (36.7 C)   Resp 20   Ht 5\' 6"  (1.676 m)   Wt 177 lb (80.3 kg)   LMP 01/31/2007   SpO2 98%   BMI 28.57 kg/m  Gen: Afebrile. No acute distress. Nontoxic in appearance, well-developed, well-nourished,  Pleasant, overweight, Caucasian female. HENT: AT. [place]. Bilateral TM visualized and normal in appearance, normal external auditory canal. MMM, no oral lesions, adequate dentition. Bilateral nares within normal limits. Throat without erythema, ulcerations or exudates. No Cough on exam, no hoarseness on exam. Eyes:Pupils Equal Round Reactive to light, Extraocular movements intact,  Conjunctiva without redness, discharge or icterus. Neck/lymp/endocrine: Supple, no lymphadenopathy, no thyromegaly CV: RRR no murmur, no edema, +2/4 P posterior tibialis pulses. No carotid bruits. No JVD. Chest: CTAB, no wheeze, rhonchi or crackles. Normal Respiratory effort. Good Air movement. Abd: Soft. Round. NTND. BS present. No Masses palpated. No hepatosplenomegaly. No rebound tenderness or guarding. Skin: No rashes, purpura or petechiae. Warm and well-perfused. Skin intact. Neuro/Msk:  Normal gait. PERLA. EOMi. Alert. Oriented x3.  Cranial nerves II through XII intact. Muscle strength 5/5 upper/lower extremity. DTRs equal bilaterally. Psych: Normal affect, dress and demeanor. Normal speech. Normal thought content and judgment.  No exam data present  Assessment/plan: Tiffany Horn is a 56 y.o. female present for CPE. Encounter for preventive health  examination Patient was encouraged to exercise greater than 150 minutes a week. Patient was encouraged to  choose a diet filled with fresh fruits and vegetables, and lean meats. AVS provided to patient today for education/recommendation on gender specific health and safety maintenance. - Immunizations up-to-date. Screening up-to-date. Hypothyroidism, unspecified type - Patient asymptomatic, will retest TSH in 2-3 months, future order has been placed. - Continue current regimen of 100 g daily. TSH abnormal artery collection, would increase the next dose. - TSH; Future - T4, free; Future - levothyroxine (SYNTHROID, LEVOTHROID) 100 MCG tablet; Take 1 tablet (100 mcg total) by mouth daily.  Dispense: 90 tablet; Refill: 0  Vitamin D deficiency Patient's vitamin D now low normal. Encouraged her to take 3 of her gummy supplementation. She will call and let us know how much vitamin D is in each gummy.  B12 deficiency Continue daily supplementation  BMI 28.0-28.9,adult Doing well, losing weight by watching diet and exercising. A1c is now normal.  Hypertriglyceridemia Lipids still with mild LDL elevation, triglycerides are now normal. Patient will start 2 g of fish oil daily continue diet and exercise modifications. Retest cholesterol in 6-12 months.  Prediabetes Patient is no longer a prediabetic, her A1c is 5.0.   Return in about 3 months (around 08/28/2016) for thyroid. Yearly for CPE. Electronically signed by: Howard Pouch, DO [place]

## 2016-08-08 ENCOUNTER — Telehealth: Payer: Self-pay | Admitting: *Deleted

## 2016-08-08 NOTE — Telephone Encounter (Signed)
Patient left voice mail requesting refill on her Klonopin. We have never Rx'd this medication for patient . Spoke with Dr Raoul Pitch patient will need an appt for evaluation since this is a controlled substance to make sure this medication is appropriate for patient. Called and left message for patient to return call.

## 2016-08-15 ENCOUNTER — Other Ambulatory Visit (INDEPENDENT_AMBULATORY_CARE_PROVIDER_SITE_OTHER): Payer: BC Managed Care – PPO

## 2016-08-15 ENCOUNTER — Other Ambulatory Visit: Payer: BC Managed Care – PPO

## 2016-08-15 DIAGNOSIS — E039 Hypothyroidism, unspecified: Secondary | ICD-10-CM

## 2016-08-15 LAB — TSH: TSH: 2.83 u[IU]/mL (ref 0.35–4.50)

## 2016-08-15 LAB — T4, FREE: Free T4: 0.76 ng/dL (ref 0.60–1.60)

## 2016-08-21 ENCOUNTER — Ambulatory Visit (INDEPENDENT_AMBULATORY_CARE_PROVIDER_SITE_OTHER): Payer: BC Managed Care – PPO | Admitting: Family Medicine

## 2016-08-21 ENCOUNTER — Encounter: Payer: Self-pay | Admitting: Family Medicine

## 2016-08-21 VITALS — BP 117/82 | HR 90 | Temp 98.1°F | Resp 20 | Ht 66.0 in | Wt 175.0 lb

## 2016-08-21 DIAGNOSIS — E039 Hypothyroidism, unspecified: Secondary | ICD-10-CM | POA: Diagnosis not present

## 2016-08-21 DIAGNOSIS — F418 Other specified anxiety disorders: Secondary | ICD-10-CM

## 2016-08-21 DIAGNOSIS — J069 Acute upper respiratory infection, unspecified: Secondary | ICD-10-CM

## 2016-08-21 DIAGNOSIS — G479 Sleep disorder, unspecified: Secondary | ICD-10-CM | POA: Diagnosis not present

## 2016-08-21 MED ORDER — TRAZODONE HCL 50 MG PO TABS: 50.0000 mg | ORAL_TABLET | Freq: Every evening | ORAL | 0 refills | Status: DC | PRN

## 2016-08-21 MED ORDER — LEVOTHYROXINE SODIUM 100 MCG PO TABS: 100.0000 ug | ORAL_TABLET | Freq: Every day | ORAL | 3 refills | Status: DC

## 2016-08-21 NOTE — Progress Notes (Signed)
Tiffany Horn , [birth year], 56 y.o., female MRN: [id number] Patient Care Team    Relationship Specialty Notifications Start End  [name], DO PCP - General Family Medicine  08/18/14   [name], MD Consulting Physician Gastroenterology  05/29/16   [name], CNM Referring Physician Certified Nurse Midwife  05/29/16     Chief Complaint  Patient presents with  . Hypothyroidism     Subjective:  Hypothyroid:  Pt reports she is feeling ok. She denies any negative side effects to medications. She is taking levothyroxine 100 g daily on empty stomach. Her TSH repeat this week is within normal range.  Sinus pain pressure:  Started over the weekend, reports she is coughing, had some congestion and a migraine. She is worried it will turn into bronchitis. She denies fever, chills, nausea, vomit, productive cough.  Depression/anxiety: She feels that her Wellbutrin 300 mg daily and Zoloft 200 mg daily are not as effective as they once were. She is taking the Klonopin as needed for panic. This is prescribed by a Dr. in [place]. Overall her life has improved over the last year, but she does endorse increased anxiety especially surrounding the loss of an additional 4 of her pets. She is taking care of her mother full-time now. In she is asking her brother for assistance, because she states it is getting a Horn overwhelming. She reports that she is not sleeping well.    Depression screen [facility] 2/9 05/29/2016 09/01/2014  Decreased Interest 0 3  Down, Depressed, Hopeless 0 3  PHQ - 2 Score 0 6  Altered sleeping - 3  Tired, decreased energy - 3  Change in appetite - 3  Feeling bad or failure about yourself  - 3  Trouble concentrating - 3  Moving slowly or fidgety/restless - 0  Suicidal thoughts - 0  PHQ-9 Score - 21    Allergies  Allergen Reactions  . Amoxicillin Diarrhea   Social History  Substance Use Topics  . Smoking status: Passive Smoke Exposure - Never Smoker  .  Smokeless tobacco: Never Used     Comment: as a very young child  . Alcohol use 0.6 - 1.2 oz/week    1 - 2 Glasses of wine per week   Past Medical History:  Diagnosis Date  . Anxiety   . Arthritis    knees and spine, shoulder  . Bronchitis    hx - recurrent  . Complication of anesthesia    waking up is not easy  . Depression   . H/O miscarriage, not currently pregnant   . Hx of irritable bowel syndrome    x2  . Hyperlipidemia    diet controlled - no medication  . Hypertension    not taking any meds at present - under control per patient  . Hypokalemia    with PNA admission (2.5)  . Migraines   . Pneumonia    4 episodes; hosp. admission 2014  . PONV (postoperative nausea and vomiting)   . Thyroid disease    Past Surgical History:  Procedure Laterality Date  . BREAST SURGERY     implants, then had them removed  . COLONOSCOPY     greater 10 yrs ago - ? [place]    . LAPAROSCOPIC ABDOMINAL EXPLORATION  1994   endometriosis  . ORIF HUMERUS FRACTURE Left 04/01/2013   DR [facility] - shoulder  . ORIF HUMERUS FRACTURE Left 04/01/2013   Procedure:  OPEN REDUCTION INTERNAL FIXATION (ORIF) LEFT PROXIMAL HUMERUS FRACTURE;  Surgeon: Mcarthur Rossetti, MD;  Location: [place];  Service: Orthopedics;  Laterality: Left;  . TONSILLECTOMY AND ADENOIDECTOMY     Family History  Problem Relation Age of Onset  . Breast cancer Mother   . Diabetes Mother   . Diabetes Father   . Dementia Father   . Colon cancer Father   . Stroke Paternal Grandmother   . Diabetes Paternal Grandmother    Allergies as of 08/21/2016      Reactions   Amoxicillin Diarrhea      Medication List       Accurate as of 08/21/16 11:55 AM. Always use your most recent med list.          B-12 PO Take by mouth daily.   buPROPion 300 MG 24 hr tablet Commonly known as:  WELLBUTRIN XL Take 1 tablet (300 mg total) by mouth daily.   clonazePAM 0.5 MG  tablet Commonly known as:  KLONOPIN as needed.   levothyroxine 100 MCG tablet Commonly known as:  SYNTHROID, LEVOTHROID Take 1 tablet (100 mcg total) by mouth daily.   rizatriptan 5 MG tablet Commonly known as:  MAXALT Take 1 tablet (5 mg total) by mouth as needed for migraine. May repeat in 2 hours if needed   sertraline 100 MG tablet Commonly known as:  ZOLOFT Take 2 tablets (200 mg total) by mouth daily.   traZODone 50 MG tablet Commonly known as:  DESYREL Take 1-2 tablets (50-100 mg total) by mouth at bedtime as needed for sleep.       All past medical history, surgical history, allergies, family history, immunizations andmedications were updated in the EMR today and reviewed under the history and medication portions of their EMR.     ROS: Negative, with the exception of above mentioned in HPI   Objective:  BP 117/82 (BP Location: Left Arm, Patient Position: Sitting, Cuff Size: Normal)   Pulse 90   Temp 98.1 F (36.7 C)   Resp 20   Ht 5\' 6"  (1.676 m)   Wt 175 lb (79.4 kg)   LMP 01/31/2007   SpO2 98%   BMI 28.25 kg/m  Body mass index is 28.25 kg/m. Gen: Afebrile. No acute distress. Nontoxic in appearance, well developed, well nourished.  HENT: AT. [place]. Bilateral TM visualized Without erythema or bulging. MMM, no oral lesions. Bilateral nares without erythema, swelling or drainage. Throat without erythema or exudates. Mild cough, no hoarseness, no sinus pressure. Eyes:Pupils Equal Round Reactive to light, Extraocular movements intact,  Conjunctiva without redness, discharge or icterus. Neck/lymp/endocrine: Supple, no lymphadenopathy CV: RRR Chest: CTAB, no wheeze or crackles. Good air movement, normal resp effort.   Neuro:  Normal gait. PERLA. EOMi. Alert. Oriented x3  Psych: Normal affect, dress and demeanor. Normal speech. Normal thought content and judgment.  No exam data present No results found. No results found for this or any previous visit (from the past  24 hour(s)).  Assessment/Plan: Tiffany Horn is a 56 y.o. female present for OV for  Hypothyroidism, unspecified type - Stable - levothyroxine (SYNTHROID, LEVOTHROID) 100 MCG tablet; Take 1 tablet (100 mcg total) by mouth daily.  Dispense: 90 tablet; Refill: 3 - Follow yearly with physicals  Depression with anxiety Sleep disturbance - Continue Wellbutrin 300 mg daily and Zoloft 200 mg daily. She can continue the Klonopin which is provided to her from a different provider. - Start trazodone 50-100 milligrams one hour daily at bedtime. Would  like to see her in a normal sleeping pattern, then reevaluate her depression/anxiety. If needed on follow-up we'll consider additional medication versus change in therapy altogether. Would consider Cymbalta. - Follow-up 4 weeks  Upper respiratory tract infection, unspecified type - Possibly very mild upper respiratory infection versus allergy related. Does not appear to need antibiotics at this time. - Rest, hydrate, and Mucinex DM.   Reviewed expectations re: course of current medical issues.  Discussed self-management of symptoms.  Outlined signs and symptoms indicating need for more acute intervention.  Patient verbalized understanding and all questions were answered.  Patient received an After-Visit Summary.    No orders of the defined types were placed in this encounter.    Note is dictated utilizing voice recognition software. Although note has been proof read prior to signing, occasional typographical errors still can be missed. If any questions arise, please do not hesitate to call for verification.   electronically signed by:  Howard Pouch, DO  [place]

## 2016-08-21 NOTE — Patient Instructions (Addendum)
Does not appear infectious today.  Mucinex DM Start antihistamine of choice.   Continue zoloft and Wellbutrin.  Start trazadone taper 1 hour before bed. Start at 1 tab, increase by a half tab every 3 days only if needed, max dose 2 tabs.   Follow up 4 weeks for depression/anxiety if not improved will discuss other meds.

## 2016-09-20 ENCOUNTER — Ambulatory Visit: Payer: BC Managed Care – PPO | Admitting: Family Medicine

## 2016-09-29 ENCOUNTER — Ambulatory Visit (INDEPENDENT_AMBULATORY_CARE_PROVIDER_SITE_OTHER): Payer: BC Managed Care – PPO | Admitting: Family Medicine

## 2016-09-29 ENCOUNTER — Telehealth: Payer: Self-pay | Admitting: Family Medicine

## 2016-09-29 ENCOUNTER — Encounter: Payer: Self-pay | Admitting: Family Medicine

## 2016-09-29 ENCOUNTER — Ambulatory Visit: Payer: BC Managed Care – PPO | Admitting: Family Medicine

## 2016-09-29 VITALS — BP 123/76 | HR 78 | Temp 98.0°F | Resp 20 | Ht 66.0 in | Wt 173.8 lb

## 2016-09-29 DIAGNOSIS — R05 Cough: Secondary | ICD-10-CM | POA: Diagnosis not present

## 2016-09-29 DIAGNOSIS — F418 Other specified anxiety disorders: Secondary | ICD-10-CM | POA: Diagnosis not present

## 2016-09-29 DIAGNOSIS — R059 Cough, unspecified: Secondary | ICD-10-CM

## 2016-09-29 DIAGNOSIS — H538 Other visual disturbances: Secondary | ICD-10-CM | POA: Diagnosis not present

## 2016-09-29 DIAGNOSIS — R42 Dizziness and giddiness: Secondary | ICD-10-CM

## 2016-09-29 MED ORDER — BUPROPION HCL ER (XL) 300 MG PO TB24: 300.0000 mg | ORAL_TABLET | Freq: Every day | ORAL | 0 refills | Status: DC

## 2016-09-29 MED ORDER — DOXEPIN HCL 25 MG PO CAPS: ORAL_CAPSULE | ORAL | 0 refills | Status: DC

## 2016-09-29 MED ORDER — SERTRALINE HCL 100 MG PO TABS: 200.0000 mg | ORAL_TABLET | Freq: Every day | ORAL | 1 refills | Status: DC

## 2016-09-29 MED ORDER — HYDROXYZINE PAMOATE 50 MG PO CAPS
50.0000 mg | ORAL_CAPSULE | Freq: Every day | ORAL | 0 refills | Status: DC
Start: 2016-09-29 — End: 2017-03-06

## 2016-09-29 MED ORDER — DOXYCYCLINE HYCLATE 100 MG PO TABS: 100.0000 mg | ORAL_TABLET | Freq: Two times a day (BID) | ORAL | 0 refills | Status: DC

## 2016-09-29 NOTE — Telephone Encounter (Addendum)
Discussed medication regimen with patient. Would rather not try doxepin given co-use with zoloft. Discussed Seroquel, which she states she had been on prior with reaction. Decided on trial of vistaril.

## 2016-09-29 NOTE — Progress Notes (Signed)
headache   Tiffany Horn , 1960/08/13, 56 y.o., female MRN: [id number] Patient Care Team    Relationship Specialty Notifications Start End  [name], DO PCP - General Family Medicine  08/18/14   [name], MD Consulting Physician Gastroenterology  05/29/16   [name], CNM Referring Physician Certified Nurse Midwife  05/29/16     Chief Complaint  Patient presents with  . Headache    daily,blurred vision at times,dizzy spells,forgetful  . Depression    medication not completely  . Anxiety     Subjective: Pt presents for an OV with Multiple complaints, appointment was made for headaches. Patient was advised she is in acute visit slot and we will not be able to address all of her concerns today. She decided she would like to discuss the following:  Blurry vision/headaches: Patient states she's had a headache off and on for 2-3 weeks that is located near her frontal sinus and bilateral eyes. She endorses intermittent dizziness and feeling off balance, especially with bending over. She reports this is occurring every day, and lasting most of the day. She feels like she is almost fallen secondary to balance. She reports she cannot turn her head in either direction without spinning. She is tearful and is worried she has a serious condition, such as cancer, and she cannot go through that when she is taking care of her mother.  Depression with anxiety: Patient has a long-standing history of depression and anxiety. She has been tried on multiple different medications by many providers and mental health providers. She has been rather stable on Wellbutrin 300 mg and Zoloft 200 mg daily for some time. However she feels overwhelmed, increased anxiety. She states her mother is not doing well, and she is her caretaker. She takes care of multiple dogs, and a few of them are terminally ill. She states that she just feels very overwhelmed, and is fearful something serious is wrong with  her and she will be able to take care of her mother.  *Patient also states at the end of the visit she has "severe abdominal pain intermittently ". I encouraged her to make an appointment to discuss this separately or worsening over the weekend she can visit the emergency room.  Depression screen [facility] 2/9 09/29/2016 05/29/2016 09/01/2014  Decreased Interest 1 0 3  Down, Depressed, Hopeless 3 0 3  PHQ - 2 Score 4 0 6  Altered sleeping 0 - 3  Tired, decreased energy 3 - 3  Change in appetite 0 - 3  Feeling bad or failure about yourself  0 - 3  Trouble concentrating 2 - 3  Moving slowly or fidgety/restless 2 - 0  Suicidal thoughts 0 - 0  PHQ-9 Score 11 - 21    Allergies  Allergen Reactions  . Amoxicillin Diarrhea   Social History  Substance Use Topics  . Smoking status: Passive Smoke Exposure - Never Smoker  . Smokeless tobacco: Never Used     Comment: as a very young child  . Alcohol use 0.6 - 1.2 oz/week    1 - 2 Glasses of wine per week   Past Medical History:  Diagnosis Date  . Anxiety   . Arthritis    knees and spine, shoulder  . Bronchitis    hx - recurrent  . Complication of anesthesia    waking up is not easy  . Depression   . H/O miscarriage, not currently pregnant   . Hx of irritable bowel syndrome  x2  . Hyperlipidemia    diet controlled - no medication  . Hypertension    not taking any meds at present - under control per patient  . Hypokalemia    with PNA admission (2.5)  . Migraines   . Pneumonia    4 episodes; hosp. admission 2014  . PONV (postoperative nausea and vomiting)   . Thyroid disease    Past Surgical History:  Procedure Laterality Date  . BREAST SURGERY     implants, then had them removed  . COLONOSCOPY     greater 10 yrs ago - ? [place]    . LAPAROSCOPIC ABDOMINAL EXPLORATION  1994   endometriosis  . ORIF HUMERUS FRACTURE Left 04/01/2013   DR [facility] - shoulder  . ORIF HUMERUS FRACTURE  Left 04/01/2013   Procedure: OPEN REDUCTION INTERNAL FIXATION (ORIF) LEFT PROXIMAL HUMERUS FRACTURE;  Surgeon: Mcarthur Rossetti, MD;  Location: [place];  Service: Orthopedics;  Laterality: Left;  . TONSILLECTOMY AND ADENOIDECTOMY     Family History  Problem Relation Age of Onset  . Breast cancer Mother   . Diabetes Mother   . Diabetes Father   . Dementia Father   . Colon cancer Father   . Stroke Paternal Grandmother   . Diabetes Paternal Grandmother    Allergies as of 09/29/2016      Reactions   Amoxicillin Diarrhea      Medication List       Accurate as of 09/29/16  2:49 PM. Always use your most recent med list.          B-12 PO Take by mouth daily.   buPROPion 300 MG 24 hr tablet Commonly known as:  WELLBUTRIN XL Take 1 tablet (300 mg total) by mouth daily.   clonazePAM 0.5 MG tablet Commonly known as:  KLONOPIN as needed.   levothyroxine 100 MCG tablet Commonly known as:  SYNTHROID, LEVOTHROID Take 1 tablet (100 mcg total) by mouth daily.   rizatriptan 5 MG tablet Commonly known as:  MAXALT Take 1 tablet (5 mg total) by mouth as needed for migraine. May repeat in 2 hours if needed   sertraline 100 MG tablet Commonly known as:  ZOLOFT Take 2 tablets (200 mg total) by mouth daily.   traZODone 50 MG tablet Commonly known as:  DESYREL Take 1-2 tablets (50-100 mg total) by mouth at bedtime as needed for sleep.       All past medical history, surgical history, allergies, family history, immunizations andmedications were updated in the EMR today and reviewed under the history and medication portions of their EMR.     ROS: Negative, with the exception of above mentioned in HPI   Objective:  BP 123/76 (BP Location: Left Arm, Patient Position: Sitting, Cuff Size: Large)   Pulse 78   Temp 98 F (36.7 C)   Resp 20   Ht 5\' 6"  (1.676 m)   Wt 173 lb 12 oz (78.8 kg)   LMP 01/31/2007   SpO2 98%   BMI 28.04 kg/m  Body mass index is 28.04 kg/m. Gen:  Afebrile. No acute distress. Nontoxic in appearance, well developed, well nourished.  HENT: AT. [place]. Bilateral TM visualized without erythema or bulging. MMM, no oral lesions. Bilateral nares with erythema and drainage. Throat without erythema or exudates. Mild cough present. No hoarseness present. Tender to palpation right maxillary sinus and frontal sinus. Eyes:Pupils Equal Round Reactive to light, Extraocular movements intact,  Conjunctiva without redness,  discharge or icterus. Neck/lymp/endocrine: Supple, no lymphadenopathy CV: RRR no murmur, no edema Chest: CTAB, no wheeze or crackles. Good air movement, normal resp effort.  Abd: Soft. NTND. BS present. No Masses palpated. No rebound or guarding.  Skin: No rashes, purpura or petechiae.  Neuro:  Normal gait. PERLA. EOMi. Alert. Oriented x3  Psych: Tearful, nervous, otherwise Normal affect, dress and demeanor. Normal speech. Normal thought content and judgment.   Visual Acuity Screening   Right eye Left eye Both eyes  Without correction: 20/30 20/30 20/30   With correction:        No exam data present No results found. No results found for this or any previous visit (from the past 24 hour(s)).  Assessment/Plan: Kahleah Crass is a 56 y.o. female present for OV for  Depression with anxiety - Worsening. Discussed different options with patient. She reports feeling foggy headed, which can come with increased anxiety or could be secondary to trazodone use. Skin is multiple options with patient today and decided on discontinuing trazodone. - Start Vistaril 50 mg qd. - Continue Wellbutrin 300 mg, continue Zoloft 200 mg. - Follow-up in 4 weeks Cough - Will order cxr, pt can go at her earliest convenience over the weekend. Will contact with results Tuesday.  - DG Chest 2 View; Future  Headaches /Dizziness/blurry vision/sinusitis: - her eye exam is normal.  - Exam is consistent with a sinus infection. Will treat with doxycycline twice a  day 10 days. - Encouraged her to start a vitamin D, B-complex and magnesium supplement. - CMP and A1c to rule out electrolyte or endocrine has cause. - COMPLETE METABOLIC PANEL WITH GFR - Hemoglobin A1c - Follow-up 2-4 weeks if no improvement.  Reviewed expectations re: course of current medical issues.  Discussed self-management of symptoms.  Outlined signs and symptoms indicating need for more acute intervention.  Patient verbalized understanding and all questions were answered.  Patient received an After-Visit Summary.    No orders of the defined types were placed in this encounter.    Note is dictated utilizing voice recognition software. Although note has been proof read prior to signing, occasional typographical errors still can be missed. If any questions arise, please do not hesitate to call for verification.   electronically signed by:  Howard Pouch, DO  [place]

## 2016-09-29 NOTE — Patient Instructions (Signed)
Stop trazodone. Start doxepin as prescribed.    Start daily over the counter: vit d 800u,  B-complex, magnesium, zyrtec.  CXR at [facility] point, just walk in , the order is in the computer.   Please help [place] help you:  We are honored you have chosen [facility] for your Primary Care home. Below you will find basic instructions that you may need to access in the future. Please help [place] help you by reading the instructions, which cover many of the frequent questions we experience.   Prescription refills and request:  -In order to allow more efficient response time, please call your pharmacy for all refills. They will forward the request electronically to [place]. This allows for the quickest possible response. Request left on a nurse line can take longer to refill, since these are checked as time allows between office patients and other phone calls.  - refill request can take up to 3-5 working days to complete.  - If request is sent electronically and request is appropiate, it is usually completed in 1-2 business days.  - all patients will need to be seen routinely for all chronic medical conditions requiring prescription medications (see follow-up below). If you are overdue for follow up on your condition, you will be asked to make an appointment and we will call in enough medication to cover you until your appointment (up to 30 days).  - all controlled substances will require a face to face visit to request/refill.  - if you desire your prescriptions to go through a new pharmacy, and have an active script at original pharmacy, you will need to call your pharmacy and have scripts transferred to new pharmacy. This is completed between the pharmacy locations and not by your provider.    Results: If any images or labs were ordered, it can take up to 1 week to get results depending on the test ordered and the lab/facility running and resulting the test. - Normal or stable results, which do not  need further discussion, may be released to your mychart immediately with attached note to you. A call may not be generated for normal results. Please make certain to sign up for mychart. If you have questions on how to activate your mychart you can call the front office.  - If your results need further discussion, our office will attempt to contact you via phone, and if unable to reach you after 2 attempts, we will release your abnormal result to your mychart with instructions.  - All results will be automatically released in mychart after 1 week.  - Your provider will provide you with explanation and instruction on all relevant material in your results. Please keep in mind, results and labs may appear confusing or abnormal to the untrained eye, but it does not mean they are actually abnormal for you personally. If you have any questions about your results that are not covered, or you desire more detailed explanation than what was provided, you should make an appointment with your provider to do so.   Our office handles many outgoing and incoming calls daily. If we have not contacted you within 1 week about your results, please check your mychart to see if there is a message first and if not, then contact our office.  In helping with this matter, you help decrease call volume, and therefore allow [place] to be able to respond to patients needs more efficiently.   Acute office visits (sick visit):  An acute  visit is intended for a new problem and are scheduled in shorter time slots to allow schedule openings for patients with new problems. This is the appropriate visit to discuss a new problem. In order to provide you with excellent quality medical care with proper time for you to explain your problem, have an exam and receive treatment with instructions, these appointments should be limited to one new problem per visit. If you experience a new problem, in which you desire to be addressed, please make an acute  office visit, we save openings on the schedule to accommodate you. Please do not save your new problem for any other type of visit, let us take care of it properly and quickly for you.   Follow up visits:  Depending on your condition(s) your provider will need to see you routinely in order to provide you with quality care and prescribe medication(s). Most chronic conditions (Example: hypertension, Diabetes, depression/anxiety... etc), require visits a couple times a year. Your provider will instruct you on proper follow up for your personal medical conditions and history. Please make certain to make follow up appointments for your condition as instructed. Failing to do so could result in lapse in your medication treatment/refills. If you request a refill, and are overdue to be seen on a condition, we will always provide you with a 30 day script (once) to allow you time to schedule.    Medicare wellness (well visit): - we have a wonderful Nurse Maudie Mercury), that will meet with you and provide you will yearly medicare wellness visits. These visits should occur yearly (can not be scheduled less than 1 calendar year apart) and cover preventive health, immunizations, advance directives and screenings you are entitled to yearly through your medicare benefits. Do not miss out on your entitled benefits, this is when medicare will pay for these benefits to be ordered for you.  These are strongly encouraged by your provider and is the appropriate type of visit to make certain you are up to date with all preventive health benefits. If you have not had your medicare wellness exam in the last 12 months, please make certain to schedule one by calling the office and schedule your medicare wellness with Maudie Mercury as soon as possible.   Yearly physical (well visit):  - Adults are recommended to be seen yearly for physicals. Check with your insurance and date of your last physical, most insurances require one calendar year between  physicals. Physicals include all preventive health topics, screenings, medical exam and labs that are appropriate for gender/age and history. You may have fasting labs needed at this visit. This is a well visit (not a sick visit), new problems should not be covered during this visit (see acute visit).  - Pediatric patients are seen more frequently when they are younger. Your provider will advise you on well child visit timing that is appropriate for your their age. - This is not a medicare wellness visit. Medicare wellness exams do not have an exam portion to the visit. Some medicare companies allow for a physical, some do not allow a yearly physical. If your medicare allows a yearly physical you can schedule the medicare wellness with our nurse Maudie Mercury and have your physical with your provider after, on the same day. Please check with insurance for your full benefits.   Late Policy/No Shows:  - all new patients should arrive 15-30 minutes earlier than appointment to allow [place] time  to  obtain all personal demographics,  insurance information  and for you to complete office paperwork. - All established patients should arrive 10-15 minutes earlier than appointment time to update all information and be checked in .  - In our best efforts to run on time, if you are late for your appointment you will be asked to either reschedule or if able, we will work you back into the schedule. There will be a wait time to work you back in the schedule,  depending on availability.  - If you are unable to make it to your appointment as scheduled, please call 24 hours ahead of time to allow [place] to fill the time slot with someone else who needs to be seen. If you do not cancel your appointment ahead of time, you may be charged a no show fee.

## 2016-09-30 LAB — COMPLETE METABOLIC PANEL WITH GFR
ALT: 14 U/L (ref 6–29)
AST: 15 U/L (ref 10–35)
Albumin: 4.6 g/dL (ref 3.6–5.1)
Alkaline Phosphatase: 67 U/L (ref 33–130)
BUN: 12 mg/dL (ref 7–25)
CO2: 24 mmol/L (ref 20–32)
Calcium: 9.3 mg/dL (ref 8.6–10.4)
Chloride: 105 mmol/L (ref 98–110)
Creat: 0.87 mg/dL (ref 0.50–1.05)
GFR, Est African American: 87 mL/min (ref >=60)
GFR, Est Non African American: 76 mL/min (ref >=60)
Glucose, Bld: 90 mg/dL (ref 65–99)
Potassium: 3.9 mmol/L (ref 3.5–5.3)
Sodium: 139 mmol/L (ref 135–146)
Total Bilirubin: 0.4 mg/dL (ref 0.2–1.2)
Total Protein: 7.2 g/dL (ref 6.1–8.1)

## 2016-09-30 LAB — HEMOGLOBIN A1C
Hgb A1c MFr Bld: 5.3 % (ref <5.7)
Mean Plasma Glucose: 105 mg/dL

## 2016-10-03 ENCOUNTER — Telehealth: Payer: Self-pay | Admitting: Family Medicine

## 2016-10-03 NOTE — Telephone Encounter (Signed)
Patient notified and verbalized understanding. 

## 2016-10-03 NOTE — Telephone Encounter (Signed)
Please call ot: All her labs are normal.  - follow up in 3.5 weeks, to follow up on start of new mwdication

## 2016-10-03 NOTE — Telephone Encounter (Signed)
Message left on voice mail for patient to return call. 

## 2016-10-04 NOTE — Telephone Encounter (Signed)
Detailed message left on voice mail, okay per DPR.  

## 2016-10-30 ENCOUNTER — Ambulatory Visit (INDEPENDENT_AMBULATORY_CARE_PROVIDER_SITE_OTHER): Payer: BC Managed Care – PPO | Admitting: Family Medicine

## 2016-10-30 ENCOUNTER — Encounter: Payer: Self-pay | Admitting: Family Medicine

## 2016-10-30 VITALS — BP 122/85 | HR 78 | Temp 98.1°F | Resp 20 | Wt 170.8 lb

## 2016-10-30 DIAGNOSIS — T63461A Toxic effect of venom of wasps, accidental (unintentional), initial encounter: Secondary | ICD-10-CM | POA: Diagnosis not present

## 2016-10-30 MED ORDER — METHYLPREDNISOLONE ACETATE 40 MG/ML IJ SUSP
40.0000 mg | Freq: Once | INTRAMUSCULAR | Status: AC
  Administered 2016-10-30: 40 mg via INTRAMUSCULAR

## 2016-10-30 MED ORDER — METHYLPREDNISOLONE ACETATE 80 MG/ML IJ SUSP
80.0000 mg | Freq: Once | INTRAMUSCULAR | Status: AC
  Administered 2016-10-30: 80 mg via INTRAMUSCULAR

## 2016-10-30 NOTE — Progress Notes (Signed)
Tiffany Horn , [birth year], 56 y.o., female MRN: [id number] Patient Care Team    Relationship Specialty Notifications Start End  [name], DO PCP - General Family Medicine  08/18/14   [name], MD Consulting Physician Gastroenterology  05/29/16   [name], CNM Referring Physician Certified Nurse Midwife  05/29/16     Chief Complaint  Patient presents with  . Insect Bite    bee sting     Subjective: Pt presents for an OV with complaints of wasp or hornet sting about 30 minutes ago. She and her lawn help was in the yard when 4 of them received a sting after a nest/hive was run over with the lawn mower. She reports never having an allergic reaction to any types of stings in the past. She took benadryl 50 mg just prior to coming here. She denies nausea, lip/tongue/throat swelling, shortness of breath or hives. She states the sting feels a little numb and itchy.   Depression screen [name] 2/9 09/29/2016 05/29/2016 09/01/2014  Decreased Interest 1 0 3  Down, Depressed, Hopeless 3 0 3  PHQ - 2 Score 4 0 6  Altered sleeping 0 - 3  Tired, decreased energy 3 - 3  Change in appetite 0 - 3  Feeling bad or failure about yourself  0 - 3  Trouble concentrating 2 - 3  Moving slowly or fidgety/restless 2 - 0  Suicidal thoughts 0 - 0  PHQ-9 Score 11 - 21    Allergies  Allergen Reactions  . Amoxicillin Diarrhea  . Seroquel [Quetiapine Fumarate]     Unknown   Social History  Substance Use Topics  . Smoking status: Passive Smoke Exposure - Never Smoker  . Smokeless tobacco: Never Used     Comment: as a very young child  . Alcohol use 0.6 - 1.2 oz/week    1 - 2 Glasses of wine per week   Past Medical History:  Diagnosis Date  . Anxiety   . Arthritis    knees and spine, shoulder  . Bronchitis    hx - recurrent  . Complication of anesthesia    waking up is not easy  . Depression   . H/O miscarriage, not currently pregnant   . Hx of irritable bowel syndrome    x2  . Hyperlipidemia    diet controlled - no medication  . Hypertension    not taking any meds at present - under control per patient  . Hypokalemia    with PNA admission (2.5)  . Migraines   . Pneumonia    4 episodes; hosp. admission 2014  . PONV (postoperative nausea and vomiting)   . Thyroid disease    Past Surgical History:  Procedure Laterality Date  . BREAST SURGERY     implants, then had them removed  . COLONOSCOPY     greater 10 yrs ago - ? [place]    . LAPAROSCOPIC ABDOMINAL EXPLORATION  1994   endometriosis  . ORIF HUMERUS FRACTURE Left 04/01/2013   [facility] - shoulder  . ORIF HUMERUS FRACTURE Left 04/01/2013   Procedure: OPEN REDUCTION INTERNAL FIXATION (ORIF) LEFT PROXIMAL HUMERUS FRACTURE;  Surgeon: Mcarthur Rossetti, MD;  Location: [place];  Service: Orthopedics;  Laterality: Left;  . TONSILLECTOMY AND ADENOIDECTOMY     Family History  Problem Relation Age of Onset  . Breast cancer Mother   . Diabetes Mother   . Diabetes Father   .  Dementia Father   . Colon cancer Father   . Stroke Paternal Grandmother   . Diabetes Paternal Grandmother    Allergies as of 10/30/2016      Reactions   Amoxicillin Diarrhea   Seroquel [quetiapine Fumarate]    Unknown      Medication List       Accurate as of 10/30/16  3:47 PM. Always use your most recent med list.          B-12 PO Take by mouth daily.   buPROPion 300 MG 24 hr tablet Commonly known as:  WELLBUTRIN XL Take 1 tablet (300 mg total) by mouth daily.   hydrOXYzine 50 MG capsule Commonly known as:  VISTARIL Take 1 capsule (50 mg total) by mouth at bedtime.   levothyroxine 100 MCG tablet Commonly known as:  SYNTHROID, LEVOTHROID Take 1 tablet (100 mcg total) by mouth daily.   rizatriptan 5 MG tablet Commonly known as:  MAXALT Take 1 tablet (5 mg total) by mouth as needed for migraine. May repeat in 2 hours if needed   sertraline 100 MG  tablet Commonly known as:  ZOLOFT Take 2 tablets (200 mg total) by mouth daily.       All past medical history, surgical history, allergies, family history, immunizations andmedications were updated in the EMR today and reviewed under the history and medication portions of their EMR.     ROS: Negative, with the exception of above mentioned in HPI   Objective:  BP 122/85 (BP Location: Left Arm, Patient Position: Sitting, Cuff Size: Large)   Pulse 78   Temp 98.1 F (36.7 C)   Resp 20   Wt 170 lb 12 oz (77.5 kg)   LMP 01/31/2007   SpO2 97%   BMI 27.56 kg/m  Body mass index is 27.56 kg/m. Gen: Afebrile. No acute distress. Nontoxic in appearance, well developed, well nourished.  HENT: AT. [place].  MMM, no oral lesions. Bilateral nares patent. Throat without erythema or swelling. No swelling of lips or tongue.  Eyes:Pupils Equal Round Reactive to light, Extraocular movements intact,  Conjunctiva without redness, discharge or icterus. CV: RRR  Chest: CTAB, no wheeze or crackles. Good air movement, normal resp effort.  Skin: no rashes, purpura or petechiae. Redness and swelling present chin to left side of mandible. Splotchy redness present on neck.  Neuro: Normal gait. PERLA. EOMi. Alert. Oriented x3  No exam data present No results found. No results found for this or any previous visit (from the past 24 hour(s)).  Assessment/Plan: Tiffany Horn is a 56 y.o. female present for OV for   Wasp sting, accidental or unintentional, initial encounter - VSS. currently appears to be a significant local reaction on her chin, left side of face (not including the mouth) and neck. No current alarm signs, however event occurred about 30 minutes ago.  - pt took benadryl 50 mg just 10 minutes prior to visit. Advised her to take benadryl 50 mg q 8 hours for next 24-48 hours depending on her reaction. ICE area when she goes home. Depo medrol provided today.  - topical creams for itch.  - discussed  alarm signs and urged her to go to ED immediately if she experiences. AVS also provided with signs/symptoms.  - methylPREDNISolone acetate (DEPO-MEDROL) injection 120 mg  Reviewed expectations re: course of current medical issues.  Discussed self-management of symptoms.  Outlined signs and symptoms indicating need for more acute intervention.  Patient verbalized understanding and all questions were answered.  Patient received an After-Visit Summary.    No orders of the defined types were placed in this encounter.    Note is dictated utilizing voice recognition software. Although note has been proof read prior to signing, occasional typographical errors still can be missed. If any questions arise, please do not hesitate to call for verification.   electronically signed by:  Howard Pouch, DO  [place]

## 2016-10-30 NOTE — Patient Instructions (Signed)
Keep benadryl in your system next 24-48 hours.  If any worsening symptoms please go to ED.  Bee, Wasp, or [employer], Adult Bees, wasps, and hornets are part of a family of insects that can sting people. These stings can cause pain and inflammation, but they are usually not serious. However, some people may have an allergic reaction to a sting. This can cause the symptoms to be more severe. What increases the risk? You may be at a greater risk of getting stung if you:  Provoke a stinging insect by swatting or disturbing it.  Wear strong-smelling soaps, deodorants, or body sprays.  Spend time outdoors near gardens with flowers or fruit trees or in clothes that expose skin.  Eat or drink outside.  What are the signs or symptoms? Common symptoms of this condition include:  A red lump in the skin that sometimes has a tiny hole in the center. In some cases, a stinger may be in the center of the wound.  Pain and itching at the sting site.  Redness and swelling around the sting site. If you have an allergic reaction (localized allergic reaction), the swelling and redness may spread out from the sting site. In some cases, this reaction can continue to develop over the next 24-48 hours.  In rare cases, a person may have a severe allergic reaction (anaphylactic reaction) to a sting. Symptoms of an anaphylactic reaction may include:  Wheezing or difficulty breathing.  Raised, itchy, red patches on the skin (hives).  Nausea or vomiting.  Abdominal cramping.  Diarrhea.  Tightness in the chest or chest pain.  Dizziness or fainting.  Redness of the face (flushing).  Hoarse voice.  Swollen tongue, lips, or face.  How is this diagnosed? This condition is usually diagnosed based on your symptoms and medical history as well as a physical exam. You may have an allergy test to determine if you are allergic to the substance that the insect injected during the sting (venom). How is this  treated? If you were stung by a bee, the stinger and a small sac of venom may be in the wound. It is important to remove the stinger as soon as possible. You can do this by brushing across the wound with gauze, a fingernail, or a flat card such as a credit card. Removing the stinger can help reduce the severity of your body's reaction to the sting. Most stings can be treated with:  Icing to reduce swelling in the area.  Medicines (antihistamines) to treat itching or an allergic reaction.  Medicines to help reduce pain. These may be medicines that you take by mouth, or medicated creams or lotions that you apply to your skin.  Pay close attention to your symptoms after you have been stung. If possible, have someone stay with you to make sure you do not have an allergic reaction. If you have any signs of an allergic reaction, call your health care provider. If you have ever had a severe allergic reaction, your health care provider may give you an inhaler or injectable medicine (epinephrine auto-injector) to use if necessary. Follow these instructions at home:  Wash the sting site 2-3 times each day with soap and water as told by your health care provider.  Apply or take over-the-counter and prescription medicines only as told by your health care provider.  If directed, apply ice to the sting area. ? Put ice in a plastic bag. ? Place a towel between your skin and the bag. ?  Leave the ice on for 20 minutes, 2-3 times a day.  Do not scratch the sting area.  If you had a severe allergic reaction to a sting, you may need: ? To wear a medical bracelet or necklace that lists the allergy. ? To learn when and how to use an anaphylaxis kit or epinephrine injection. Your family members and coworkers may also need to learn this. ? To carry an anaphylaxis kit or epinephrine injection with you at all times. How is this prevented?  Avoid swatting at stinging insects and disturbing insect nests.  Do not  use fragrant soaps or lotions.  Wear shoes, pants, and long sleeves when spending time outdoors, especially in grassy areas where stinging insects are common.  Keep outdoor areas free from nests or hives.  Keep food and drink containers covered when eating outdoors.  Avoid working or sitting near [employer], if possible.  Wear gloves if you are gardening or working outdoors.  If an attack by a stinging insect or a swarm seems likely in the moment, move away from the area or find a barrier between you and the insect(s), such as a door. Contact a health care provider if:  Your symptoms do not get better in 2-3 days.  You have redness, swelling, or pain that spreads beyond the area of the sting.  You have a fever. Get help right away if: You have symptoms of a severe allergic reaction. These include:  Wheezing or difficulty breathing.  Tightness in the chest or chest pain.  Light-headedness or fainting.  Itchy, raised, red patches on the skin.  Nausea or vomiting.  Abdominal cramping.  Diarrhea.  A swollen tongue or lips, or trouble swallowing.  Dizziness or fainting.  Summary  Stings from bees, wasps, and hornets can cause pain and inflammation, but they are usually not serious. However, some people may have an allergic reaction to a sting. This can cause the symptoms to be more severe.  Pay close attention to your symptoms after you have been stung. If possible, have someone stay with you to make sure you do not have an allergic reaction.  Call your health care provider if you have any signs of an allergic reaction. This information is not intended to replace advice given to you by your health care provider. Make sure you discuss any questions you have with your health care provider. Document Released: 01/16/2005 Document Revised: 03/23/2016 Document Reviewed: 03/23/2016 Elsevier Interactive Patient Education  [employer].

## 2016-11-01 ENCOUNTER — Telehealth: Payer: Self-pay | Admitting: Family Medicine

## 2016-11-01 ENCOUNTER — Encounter: Payer: Self-pay | Admitting: *Deleted

## 2016-11-01 NOTE — Telephone Encounter (Signed)
Clarification on this message is needed to further assist patient and make certain she receives appropriate care.  1. Pneumococcal vaccines (there are 2) are indicated for people over 4 or in younger population who have proven chronic disease  (heart, lung, diabetes etc).   - she is 52, and does not have any of the chronic illnesses this would be indicated and is not a smoker. 2. Pharmacies are able to provide this immunization without prescription, if it is indicated.

## 2016-11-01 NOTE — Telephone Encounter (Signed)
patient requesting RX sent to [facility] for pneumonia vaccine.   Fax number [phone] Please advise.

## 2016-11-02 NOTE — Telephone Encounter (Signed)
Message was sent to patient in [facility] Chart.

## 2016-12-13 ENCOUNTER — Telehealth: Payer: Self-pay

## 2016-12-13 MED ORDER — BUPROPION HCL ER (XL) 300 MG PO TB24: 300.0000 mg | ORAL_TABLET | Freq: Every day | ORAL | 0 refills | Status: DC

## 2016-12-13 NOTE — Telephone Encounter (Signed)
Patient called about refills on medications for anxiety and depression. Patient instructed to schedule follow up appt and refused to schedule and appt.  Patient placed on hold and transferred to Tiffany Horn to try to explain to patient the necessity of an appointment.

## 2016-12-13 NOTE — Telephone Encounter (Signed)
Spoke with patient reviewed information. Patient verbalized understanding 

## 2016-12-13 NOTE — Telephone Encounter (Signed)
Spoke with patient she states under current situation she can't come in to be seen she is taking care of her mother and is currently unemployed and cant afford to come in at this time. She states she understands she cant get Klonopin refilled but is requesting her Wellbutrin be refilled she has enough sertraline at this time. Explained to patient routine follow up is required for continued refills. She states Dr Raoul Pitch aware of her current situation and is requesting she refill her depression medication since she was seen in August for this. Please advise.

## 2016-12-13 NOTE — Telephone Encounter (Signed)
Please explain to patient,  I do sympathize with all that she is going through currently. I will assume she is doing ok on current doses and I have called in an additional 90d of the wellbutrin. She will however need to follow up before running out of medications in order to receive any additional refills.  - all patients must be seen routinely for chronic conditions in order to be properly monitored for condition.  Controlled depression/anxiety is at least every 6 months for that issue. Uncontrolled, which was her complaint in August, is more frequent.  - I hope this gets her through this tough time, and I will see her in 2-3 months.

## 2017-03-02 ENCOUNTER — Ambulatory Visit: Payer: BC Managed Care – PPO | Admitting: Family Medicine

## 2017-03-05 ENCOUNTER — Ambulatory Visit: Payer: BC Managed Care – PPO | Admitting: Family Medicine

## 2017-03-06 ENCOUNTER — Encounter: Payer: Self-pay | Admitting: Family Medicine

## 2017-03-06 ENCOUNTER — Ambulatory Visit: Payer: BC Managed Care – PPO | Admitting: Family Medicine

## 2017-03-06 VITALS — BP 113/75 | HR 87 | Temp 97.6°F | Ht 66.0 in | Wt 178.1 lb

## 2017-03-06 DIAGNOSIS — R0609 Other forms of dyspnea: Secondary | ICD-10-CM | POA: Diagnosis not present

## 2017-03-06 DIAGNOSIS — E785 Hyperlipidemia, unspecified: Secondary | ICD-10-CM

## 2017-03-06 DIAGNOSIS — R0789 Other chest pain: Secondary | ICD-10-CM | POA: Diagnosis not present

## 2017-03-06 DIAGNOSIS — F418 Other specified anxiety disorders: Secondary | ICD-10-CM | POA: Diagnosis not present

## 2017-03-06 LAB — LIPID PANEL
Cholesterol: 253 mg/dL — ABNORMAL HIGH (ref 0–200)
HDL: 49.9 mg/dL (ref >39.00)
LDL Cholesterol: 175 mg/dL — ABNORMAL HIGH (ref 0–99)
NonHDL: 202.81
Total CHOL/HDL Ratio: 5
Triglycerides: 138 mg/dL (ref 0.0–149.0)
VLDL: 27.6 mg/dL (ref 0.0–40.0)

## 2017-03-06 MED ORDER — SERTRALINE HCL 100 MG PO TABS: 100.0000 mg | ORAL_TABLET | Freq: Every day | ORAL | 1 refills | Status: DC

## 2017-03-06 MED ORDER — BUPROPION HCL ER (XL) 150 MG PO TB24: 300.0000 mg | ORAL_TABLET | Freq: Every day | ORAL | 1 refills | Status: DC

## 2017-03-06 NOTE — Patient Instructions (Addendum)
I have refilled your medications for you.  Referral to cardiology.  I will call you with lab results.   F/u 6 mos. As long as doing well.    Please help [place] help you:  We are honored you have chosen [facility] for your [facility] home. Below you will find basic instructions that you may need to access in the future. Please help [place] help you by reading the instructions, which cover many of the frequent questions we experience.   Prescription refills and request:  -In order to allow more efficient response time, please call your pharmacy for all refills. They will forward the request electronically to [place]. This allows for the quickest possible response. Request left on a nurse line can take longer to refill, since these are checked as time allows between office patients and other phone calls.  - refill request can take up to 3-5 working days to complete.  - If request is sent electronically and request is appropiate, it is usually completed in 1-2 business days.  - all patients will need to be seen routinely for all chronic medical conditions requiring prescription medications (see follow-up below). If you are overdue for follow up on your condition, you will be asked to make an appointment and we will call in enough medication to cover you until your appointment (up to 30 days).  - all controlled substances will require a face to face visit to request/refill.  - if you desire your prescriptions to go through a new pharmacy, and have an active script at original pharmacy, you will need to call your pharmacy and have scripts transferred to new pharmacy. This is completed between the pharmacy locations and not by your provider.    Results: If any images or labs were ordered, it can take up to 1 week to get results depending on the test ordered and the lab/facility running and resulting the test. - Normal or stable results, which do not need further discussion, may be released to your mychart  immediately with attached note to you. A call may not be generated for normal results. Please make certain to sign up for mychart. If you have questions on how to activate your mychart you can call the front office.  - If your results need further discussion, our office will attempt to contact you via phone, and if unable to reach you after 2 attempts, we will release your abnormal result to your mychart with instructions.  - All results will be automatically released in mychart after 1 week.  - Your provider will provide you with explanation and instruction on all relevant material in your results. Please keep in mind, results and labs may appear confusing or abnormal to the untrained eye, but it does not mean they are actually abnormal for you personally. If you have any questions about your results that are not covered, or you desire more detailed explanation than what was provided, you should make an appointment with your provider to do so.   Our office handles many outgoing and incoming calls daily. If we have not contacted you within 1 week about your results, please check your mychart to see if there is a message first and if not, then contact our office.  In helping with this matter, you help decrease call volume, and therefore allow [place] to be able to respond to patients needs more efficiently.   Acute office visits (sick visit):  An acute visit is intended for a new problem  and are scheduled in shorter time slots to allow schedule openings for patients with new problems. This is the appropriate visit to discuss a new problem. In order to provide you with excellent quality medical care with proper time for you to explain your problem, have an exam and receive treatment with instructions, these appointments should be limited to one new problem per visit. If you experience a new problem, in which you desire to be addressed, please make an acute office visit, we save openings on the schedule to  accommodate you. Please do not save your new problem for any other type of visit, let us take care of it properly and quickly for you.   Follow up visits:  Depending on your condition(s) your provider will need to see you routinely in order to provide you with quality care and prescribe medication(s). Most chronic conditions (Example: hypertension, Diabetes, depression/anxiety... etc), require visits a couple times a year. Your provider will instruct you on proper follow up for your personal medical conditions and history. Please make certain to make follow up appointments for your condition as instructed. Failing to do so could result in lapse in your medication treatment/refills. If you request a refill, and are overdue to be seen on a condition, we will always provide you with a 30 day script (once) to allow you time to schedule.    Medicare wellness (well visit): - we have a wonderful Nurse Maudie Mercury), that will meet with you and provide you will yearly medicare wellness visits. These visits should occur yearly (can not be scheduled less than 1 calendar year apart) and cover preventive health, immunizations, advance directives and screenings you are entitled to yearly through your medicare benefits. Do not miss out on your entitled benefits, this is when medicare will pay for these benefits to be ordered for you.  These are strongly encouraged by your provider and is the appropriate type of visit to make certain you are up to date with all preventive health benefits. If you have not had your medicare wellness exam in the last 12 months, please make certain to schedule one by calling the office and schedule your medicare wellness with Maudie Mercury as soon as possible.   Yearly physical (well visit):  - Adults are recommended to be seen yearly for physicals. Check with your insurance and date of your last physical, most insurances require one calendar year between physicals. Physicals include all preventive health  topics, screenings, medical exam and labs that are appropriate for gender/age and history. You may have fasting labs needed at this visit. This is a well visit (not a sick visit), new problems should not be covered during this visit (see acute visit).  - Pediatric patients are seen more frequently when they are younger. Your provider will advise you on well child visit timing that is appropriate for your their age. - This is not a medicare wellness visit. Medicare wellness exams do not have an exam portion to the visit. Some medicare companies allow for a physical, some do not allow a yearly physical. If your medicare allows a yearly physical you can schedule the medicare wellness with our nurse Maudie Mercury and have your physical with your provider after, on the same day. Please check with insurance for your full benefits.   Late Policy/No Shows:  - all new patients should arrive 15-30 minutes earlier than appointment to allow [place] time  to  obtain all personal demographics,  insurance information and for you to complete office paperwork. -  All established patients should arrive 10-15 minutes earlier than appointment time to update all information and be checked in .  - In our best efforts to run on time, if you are late for your appointment you will be asked to either reschedule or if able, we will work you back into the schedule. There will be a wait time to work you back in the schedule,  depending on availability.  - If you are unable to make it to your appointment as scheduled, please call 24 hours ahead of time to allow [place] to fill the time slot with someone else who needs to be seen. If you do not cancel your appointment ahead of time, you may be charged a no show fee.

## 2017-03-06 NOTE — Progress Notes (Signed)
Tiffany Horn , [birth year], 57 y.o., female MRN: [id number] Patient Care Team    Relationship Specialty Notifications Start End  [name], DO PCP - General Family Medicine  08/18/14   [name], MD Consulting Physician Gastroenterology  05/29/16   [name], CNM Referring Physician Certified Nurse Midwife  05/29/16     Chief Complaint  Patient presents with  . Follow-up    F/U for stress and possible elevated blood pressure     Subjective:  Dyspnea on exertion/lipidemia:  Pt Reports she had an episode of chest discomfort after high anxiety situation with her family 3 months ago. EMS was called and she was taken to an emergency room (no records available). She reports her blood pressure was extremely high. She reports they told her she was not having a heart attack. Since that time she has noticed an increase in dyspnea on exertion, especially with climbing stairs. She reports she'll break out in sweats, become nauseous and need to take a break. She also reports left neck pain during that time. She has a family history of heart disease and stroke.  She has a personal history of hyperlipidemia with hypertriglyceridemia and has declined medications in the past.  Depression with anxiety: Patient reports she is doing well from her depression and anxiety standpoint. She has been taking the B-12 and magnesium. She has been taking her thyroid medicine routinely. She has been scheduled with her counselor more routinely. She has been able to set boundaries with her family allowing her from her personal time. 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 She reports she is feeling well, and has been trying to cut back more on the medicine and is taking Zoloft 100 mg daily and Wellbutrin 150 mg daily. She is implementing more personal time and breathing exercises, vitamins to help with her condition. Prior note 09/29/2016: Patient  has a long-standing history of depression and anxiety. She has been tried on multiple different medications by many providers and mental health providers. She has been rather stable on Wellbutrin 300 mg and Zoloft 200 mg daily for some time. However she feels overwhelmed, increased anxiety. She states her mother is not doing well, and she is her caretaker. She takes care of multiple dogs, and a few of them are terminally ill. She states that she just feels very overwhelmed, and is fearful something serious is wrong with her and she will be able to take care of her mother.   Depression screen [facility] 2/9 03/06/2017 09/29/2016 05/29/2016 09/01/2014  Decreased Interest 2 1 0 3  Down, Depressed, Hopeless 2 3 0 3  PHQ - 2 Score 4 4 0 6  Altered sleeping 2 0 - 3  Tired, decreased energy 3 3 - 3  Change in appetite 0 0 - 3  Feeling bad or failure about yourself  0 0 - 3  Trouble concentrating 1 2 - 3  Moving slowly or fidgety/restless 0 2 - 0  Suicidal thoughts 0 0 - 0  PHQ-9 Score 10 11 - 21   GAD 7 : Generalized Anxiety Score 03/06/2017 09/29/2016  Nervous, Anxious, on Edge 3 3  Control/stop worrying 2 1  Worry too much - different things 2 0  Trouble relaxing 3 3  Restless 0 3  Easily annoyed or irritable 0 3  Afraid - awful might happen 2 3  Total GAD 7 Score 12 16  Anxiety Difficulty -  Very difficult     Allergies  Allergen Reactions  . Amoxicillin Diarrhea  . Seroquel [Quetiapine Fumarate]     Unknown   Social History   Tobacco Use  . Smoking status: Passive Smoke Exposure - Never Smoker  . Smokeless tobacco: Never Used  . Tobacco comment: as a very young child  Substance Use Topics  . Alcohol use: Yes    Alcohol/week: 0.6 - 1.2 oz    Types: 1 - 2 Glasses of wine per week   Past Medical History:  Diagnosis Date  . Anxiety   . Arthritis    knees and spine, shoulder  . Bronchitis    hx - recurrent  . Complication of anesthesia    waking up is not easy  . Depression   . H/O  miscarriage, not currently pregnant   . Hx of irritable bowel syndrome    x2  . Hyperlipidemia    diet controlled - no medication  . Hypertension    not taking any meds at present - under control per patient  . Hypokalemia    with PNA admission (2.5)  . Migraines   . Pneumonia    4 episodes; hosp. admission 2014  . PONV (postoperative nausea and vomiting)   . Thyroid disease    Past Surgical History:  Procedure Laterality Date  . BREAST SURGERY     implants, then had them removed  . COLONOSCOPY     greater 10 yrs ago - ? [place]    . LAPAROSCOPIC ABDOMINAL EXPLORATION  1994   endometriosis  . ORIF HUMERUS FRACTURE Left 04/01/2013   [facility] - shoulder  . ORIF HUMERUS FRACTURE Left 04/01/2013   Procedure: OPEN REDUCTION INTERNAL FIXATION (ORIF) LEFT PROXIMAL HUMERUS FRACTURE;  Surgeon: Mcarthur Rossetti, MD;  Location: [facility];  Service: Orthopedics;  Laterality: Left;  . TONSILLECTOMY AND ADENOIDECTOMY     Family History  Problem Relation Age of Onset  . Breast cancer Mother   . Diabetes Mother   . Heart disease Mother   . Diabetes Father   . Dementia Father   . Colon cancer Father   . Stroke Paternal Grandmother   . Diabetes Paternal Grandmother    Allergies as of 03/06/2017      Reactions   Amoxicillin Diarrhea   Seroquel [quetiapine Fumarate]    Unknown      Medication List        Accurate as of 03/06/17 11:59 PM. Always use your most recent med list.          B-12 PO Take by mouth daily.   buPROPion 150 MG 24 hr tablet Commonly known as:  WELLBUTRIN XL Take 2 tablets (300 mg total) by mouth daily.   levothyroxine 100 MCG tablet Commonly known as:  SYNTHROID, LEVOTHROID Take 1 tablet (100 mcg total) by mouth daily.   rizatriptan 5 MG tablet Commonly known as:  MAXALT Take 1 tablet (5 mg total) by mouth as needed for migraine. May repeat in 2 hours if needed   sertraline 100 MG tablet Commonly  known as:  ZOLOFT Take 1 tablet (100 mg total) by mouth daily.       All past medical history, surgical history, allergies, family history, immunizations andmedications were updated in the EMR today and reviewed under the history and medication portions of their EMR.     ROS: Negative, with the exception of above mentioned in HPI   Objective:  BP 113/75 (BP Location: Right Arm, Patient Position: Sitting, Cuff Size: Large)   Pulse 87   Temp 97.6 F (36.4 C) (Oral)   Ht 5\' 6"  (1.676 m)   Wt 178 lb 1.9 oz (80.8 kg)   LMP 01/31/2007   SpO2 97%   BMI 28.75 kg/m  Body mass index is 28.75 kg/m. Gen: Afebrile. No acute distress. Nontoxic in appearance, well-developed, well-nourished, Caucasian female. Appears well today. HENT: AT. [place]. Bilateral TM visualized, mild fullness left tympanic membrane, otherwise normal. MMM. Bilateral nares without erythema or drainage. Throat without erythema or exudates. No cough. No hoarseness. Eyes:Pupils Equal Round Reactive to light, Extraocular movements intact,  Conjunctiva without redness, discharge or icterus. Neck/lymp/endocrine: Supple, no lymphadenopathy, no thyromegaly CV: RRR no murmur, no edema, +2/4 P posterior tibialis pulses Chest: CTAB, no wheeze or crackles Abd: Soft. NTND. BS present. no Masses palpated.  Skin: no rashes, purpura or petechiae.  Neuro:  Normal gait. PERLA. EOMi. Alert. Oriented x3  Psych: Normal affect, dress and demeanor. Normal speech. Normal thought content and judgment..    No exam data present   No exam data present No results found. No results found for this or any previous visit (from the past 24 hour(s)).  Assessment/Plan: Keauna Brasel is a 57 y.o. female present for OV for  Depression with anxiety - Much improved with counseling and medications.  - Continue Wellbutrin 150 mg daily and Zoloft 100 mg daily  (lower dose than prior). Refills provided today. - Continue vitamin D, B-12 and magnesium  supplementations.  - Continue counseling - Follow-up 6 months  Dyspnea/hyperlipidemia:  New onset over the last few months. Family history of heart disease in her mother. She is asymptomatic today, and does not appear fluid overloaded. Repeat lipid panel today if cholesterol is still elevated, will strongly encourage statins again. Patient seems more agreeable today to consider starting. Agreed to refer to cardiology for dyspnea workup.    Reviewed expectations re: course of current medical issues.  Discussed self-management of symptoms.  Outlined signs and symptoms indicating need for more acute intervention.  Patient verbalized understanding and all questions were answered.  Patient received an After-Visit Summary.    Orders Placed This Encounter  Procedures  . Lipid panel  . Ambulatory referral to Cardiology     Note is dictated utilizing voice recognition software. Although note has been proof read prior to signing, occasional typographical errors still can be missed. If any questions arise, please do not hesitate to call for verification.   electronically signed by:  Howard Pouch, DO  [facility]

## 2017-03-07 ENCOUNTER — Telehealth: Payer: Self-pay | Admitting: Family Medicine

## 2017-03-07 ENCOUNTER — Encounter: Payer: Self-pay | Admitting: Family Medicine

## 2017-03-07 DIAGNOSIS — G43909 Migraine, unspecified, not intractable, without status migrainosus: Secondary | ICD-10-CM

## 2017-03-07 MED ORDER — BUPROPION HCL ER (XL) 150 MG PO TB24: 150.0000 mg | ORAL_TABLET | Freq: Every day | ORAL | 1 refills | Status: DC

## 2017-03-07 MED ORDER — ATORVASTATIN CALCIUM 20 MG PO TABS: 20.0000 mg | ORAL_TABLET | Freq: Every day | ORAL | 3 refills | Status: DC

## 2017-03-07 MED ORDER — RIZATRIPTAN BENZOATE 5 MG PO TABS: 5.0000 mg | ORAL_TABLET | ORAL | 4 refills | Status: DC | PRN

## 2017-03-07 NOTE — Telephone Encounter (Signed)
-----   Message from Leota Jacobsen, LPN sent at 08/04/1023  1:19 PM EST ----- Spoke with patient reviewed lab results and instructions. Patient verbalized understanding. Patient willing to try a statin and she would also like her Maxalt refilled. She will call back to schedule her appt after starting the statin.

## 2017-03-30 ENCOUNTER — Other Ambulatory Visit: Payer: Self-pay | Admitting: Unknown Physician Specialty

## 2017-03-30 DIAGNOSIS — I10 Essential (primary) hypertension: Secondary | ICD-10-CM

## 2017-03-30 DIAGNOSIS — R0602 Shortness of breath: Secondary | ICD-10-CM

## 2017-03-30 DIAGNOSIS — R079 Chest pain, unspecified: Secondary | ICD-10-CM

## 2017-04-06 ENCOUNTER — Ambulatory Visit
Admission: RE | Admit: 2017-04-06 | Discharge: 2017-04-06 | Disposition: A | Discharge status: Home / Self Care | Payer: BC Managed Care – PPO | Source: Ambulatory Visit | Attending: Unknown Physician Specialty | Admitting: Unknown Physician Specialty

## 2017-04-06 ENCOUNTER — Ambulatory Visit: Payer: BC Managed Care – PPO | Admitting: Cardiovascular Disease

## 2017-04-06 ENCOUNTER — Telehealth: Payer: Self-pay | Admitting: Family Medicine

## 2017-04-06 DIAGNOSIS — I10 Essential (primary) hypertension: Secondary | ICD-10-CM

## 2017-04-06 DIAGNOSIS — R079 Chest pain, unspecified: Secondary | ICD-10-CM

## 2017-04-06 DIAGNOSIS — R0602 Shortness of breath: Secondary | ICD-10-CM

## 2017-04-06 NOTE — Telephone Encounter (Signed)
Spoke with patient we do not Rx this for her she was getting it from her Dr in [place]. Explained to patient this is a controlled medication she would have have to be evaluated by Dr Raoul Pitch for this and has to sign a controlled substance contract and would be required to follow up every 3 months for refills. Patient states she will call her Dr in [place] for refill since they were they one writing Rx for this.

## 2017-04-06 NOTE — Telephone Encounter (Signed)
Copied from [place] [phone]. Topic: Quick Communication - Rx Refill/Question >> Apr 06, 2017 12:10 PM Aurelio Brash B wrote: Medication: Klonopin  (not on med list)   Has the patient contacted their pharmacy? yes   (Agent: If no, request that the patient contact the pharmacy for the refill.)   Preferred Pharmacy (with phone number or street name): [id number] - [place], [address] [phone] (Phone) [phone] (Fax)     Agent: Please be advised that RX refills may take up to 3 business days. We ask that you follow-up with your pharmacy.

## 2017-04-06 NOTE — Telephone Encounter (Signed)
Left message to call back about Klonopin request. Medication is not on medication list.

## 2017-04-06 NOTE — Telephone Encounter (Signed)
Patient called to ask about the request for Klonopin, since it is not listed on her medication profile. She says "I believe it was taken off at my last visit because I said I took it as needed. I take it twice a day as needed everyday and I am needing a refill." I advised this would be sent over for Dr. Raoul Pitch to approve.  Klonopin 1 mg po bid  Last OV: 03/06/17 PCP: [place]:  [id number] - [place], [place] [address] [phone] (Phone) [phone] (Fax

## 2017-04-16 ENCOUNTER — Other Ambulatory Visit: Payer: BC Managed Care – PPO

## 2017-06-06 ENCOUNTER — Ambulatory Visit: Payer: BC Managed Care – PPO | Admitting: Certified Nurse Midwife

## 2017-07-17 ENCOUNTER — Encounter: Payer: Self-pay | Admitting: Family Medicine

## 2017-07-17 ENCOUNTER — Telehealth: Payer: Self-pay | Admitting: Family Medicine

## 2017-07-17 ENCOUNTER — Ambulatory Visit: Payer: BC Managed Care – PPO | Admitting: Family Medicine

## 2017-07-17 ENCOUNTER — Ambulatory Visit (HOSPITAL_BASED_OUTPATIENT_CLINIC_OR_DEPARTMENT_OTHER)
Admission: RE | Admit: 2017-07-17 | Discharge: 2017-07-17 | Disposition: A | Discharge status: Home / Self Care | Payer: BC Managed Care – PPO | Source: Ambulatory Visit | Attending: Family Medicine | Admitting: Family Medicine

## 2017-07-17 VITALS — BP 116/62 | HR 92 | Resp 16 | Ht 66.0 in | Wt 183.0 lb

## 2017-07-17 DIAGNOSIS — R42 Dizziness and giddiness: Secondary | ICD-10-CM

## 2017-07-17 DIAGNOSIS — M2550 Pain in unspecified joint: Secondary | ICD-10-CM

## 2017-07-17 DIAGNOSIS — E039 Hypothyroidism, unspecified: Secondary | ICD-10-CM | POA: Diagnosis not present

## 2017-07-17 DIAGNOSIS — R0609 Other forms of dyspnea: Secondary | ICD-10-CM | POA: Diagnosis not present

## 2017-07-17 DIAGNOSIS — R5383 Other fatigue: Secondary | ICD-10-CM | POA: Diagnosis not present

## 2017-07-17 DIAGNOSIS — R937 Abnormal findings on diagnostic imaging of other parts of musculoskeletal system: Secondary | ICD-10-CM | POA: Diagnosis not present

## 2017-07-17 LAB — CBC WITH DIFFERENTIAL/PLATELET
Basophils Absolute: 0.1 10*3/uL (ref 0.0–0.1)
Basophils Relative: 1.2 % (ref 0.0–3.0)
Eosinophils Absolute: 0.2 10*3/uL (ref 0.0–0.7)
Eosinophils Relative: 4.1 % (ref 0.0–5.0)
HCT: 43.3 % (ref 36.0–46.0)
Hemoglobin: 14.8 g/dL (ref 12.0–15.0)
Lymphocytes Relative: 27.5 % (ref 12.0–46.0)
Lymphs Abs: 1.5 10*3/uL (ref 0.7–4.0)
MCHC: 34.2 g/dL (ref 30.0–36.0)
MCV: 89.1 fl (ref 78.0–100.0)
Monocytes Absolute: 0.5 10*3/uL (ref 0.1–1.0)
Monocytes Relative: 9.2 % (ref 3.0–12.0)
Neutro Abs: 3.2 10*3/uL (ref 1.4–7.7)
Neutrophils Relative %: 58 % (ref 43.0–77.0)
Platelets: 242 10*3/uL (ref 150.0–400.0)
RBC: 4.86 Mil/uL (ref 3.87–5.11)
RDW: 13.6 % (ref 11.5–15.5)
WBC: 5.5 10*3/uL (ref 4.0–10.5)

## 2017-07-17 LAB — SEDIMENTATION RATE: Sed Rate: 10 mm/hr (ref 0–30)

## 2017-07-17 LAB — C-REACTIVE PROTEIN: CRP: 0.2 mg/dL — ABNORMAL LOW (ref 0.5–20.0)

## 2017-07-17 NOTE — Patient Instructions (Signed)
We will look into arthritis, thyroid, lungs and heart and possibly causes of your symptoms. Please have chest xray completed today.  Please make an appt to discuss the others providers results once you get them and I will do research as well.   Please help [place] help you:  We are honored you have chosen [facility] for your [facility] home. Below you will find basic instructions that you may need to access in the future. Please help [place] help you by reading the instructions, which cover many of the frequent questions we experience.   Prescription refills and request:  -In order to allow more efficient response time, please call your pharmacy for all refills. They will forward the request electronically to [place]. This allows for the quickest possible response. Request left on a nurse line can take longer to refill, since these are checked as time allows between office patients and other phone calls.  - refill request can take up to 3-5 working days to complete.  - If request is sent electronically and request is appropiate, it is usually completed in 1-2 business days.  - all patients will need to be seen routinely for all chronic medical conditions requiring prescription medications (see follow-up below). If you are overdue for follow up on your condition, you will be asked to make an appointment and we will call in enough medication to cover you until your appointment (up to 30 days).  - all controlled substances will require a face to face visit to request/refill.  - if you desire your prescriptions to go through a new pharmacy, and have an active script at original pharmacy, you will need to call your pharmacy and have scripts transferred to new pharmacy. This is completed between the pharmacy locations and not by your provider.    Results: If any images or labs were ordered, it can take up to 1 week to get results depending on the test ordered and the lab/facility running and resulting the  test. - Normal or stable results, which do not need further discussion, may be released to your mychart immediately with attached note to you. A call may not be generated for normal results. Please make certain to sign up for mychart. If you have questions on how to activate your mychart you can call the front office.  - If your results need further discussion, our office will attempt to contact you via phone, and if unable to reach you after 2 attempts, we will release your abnormal result to your mychart with instructions.  - All results will be automatically released in mychart after 1 week.  - Your provider will provide you with explanation and instruction on all relevant material in your results. Please keep in mind, results and labs may appear confusing or abnormal to the untrained eye, but it does not mean they are actually abnormal for you personally. If you have any questions about your results that are not covered, or you desire more detailed explanation than what was provided, you should make an appointment with your provider to do so.   Our office handles many outgoing and incoming calls daily. If we have not contacted you within 1 week about your results, please check your mychart to see if there is a message first and if not, then contact our office.  In helping with this matter, you help decrease call volume, and therefore allow [place] to be able to respond to patients needs more efficiently.   Acute office  visits (sick visit):  An acute visit is intended for a new problem and are scheduled in shorter time slots to allow schedule openings for patients with new problems. This is the appropriate visit to discuss a new problem. Problems will not be addressed by phone call or Echart message. Appointment is needed if requesting treatment. In order to provide you with excellent quality medical care with proper time for you to explain your problem, have an exam and receive treatment with instructions,  these appointments should be limited to one new problem per visit. If you experience a new problem, in which you desire to be addressed, please make an acute office visit, we save openings on the schedule to accommodate you. Please do not save your new problem for any other type of visit, let us take care of it properly and quickly for you.   Follow up visits:  Depending on your condition(s) your provider will need to see you routinely in order to provide you with quality care and prescribe medication(s). Most chronic conditions (Example: hypertension, Diabetes, depression/anxiety... etc), require visits a couple times a year. Your provider will instruct you on proper follow up for your personal medical conditions and history. Please make certain to make follow up appointments for your condition as instructed. Failing to do so could result in lapse in your medication treatment/refills. If you request a refill, and are overdue to be seen on a condition, we will always provide you with a 30 day script (once) to allow you time to schedule.    Medicare wellness (well visit): - we have a wonderful Nurse Maudie Mercury), that will meet with you and provide you will yearly medicare wellness visits. These visits should occur yearly (can not be scheduled less than 1 calendar year apart) and cover preventive health, immunizations, advance directives and screenings you are entitled to yearly through your medicare benefits. Do not miss out on your entitled benefits, this is when medicare will pay for these benefits to be ordered for you.  These are strongly encouraged by your provider and is the appropriate type of visit to make certain you are up to date with all preventive health benefits. If you have not had your medicare wellness exam in the last 12 months, please make certain to schedule one by calling the office and schedule your medicare wellness with Maudie Mercury as soon as possible.   Yearly physical (well visit):  - Adults are  recommended to be seen yearly for physicals. Check with your insurance and date of your last physical, most insurances require one calendar year between physicals. Physicals include all preventive health topics, screenings, medical exam and labs that are appropriate for gender/age and history. You may have fasting labs needed at this visit. This is a well visit (not a sick visit), new problems should not be covered during this visit (see acute visit).  - Pediatric patients are seen more frequently when they are younger. Your provider will advise you on well child visit timing that is appropriate for your their age. - This is not a medicare wellness visit. Medicare wellness exams do not have an exam portion to the visit. Some medicare companies allow for a physical, some do not allow a yearly physical. If your medicare allows a yearly physical you can schedule the medicare wellness with our nurse Maudie Mercury and have your physical with your provider after, on the same day. Please check with insurance for your full benefits.   Late Policy/No Shows:  - all  new patients should arrive 15-30 minutes earlier than appointment to allow [place] time  to  obtain all personal demographics,  insurance information and for you to complete office paperwork. - All established patients should arrive 10-15 minutes earlier than appointment time to update all information and be checked in .  - In our best efforts to run on time, if you are late for your appointment you will be asked to either reschedule or if able, we will work you back into the schedule. There will be a wait time to work you back in the schedule,  depending on availability.  - If you are unable to make it to your appointment as scheduled, please call 24 hours ahead of time to allow [place] to fill the time slot with someone else who needs to be seen. If you do not cancel your appointment ahead of time, you may be charged a no show fee.

## 2017-07-17 NOTE — Telephone Encounter (Signed)
Left detailed message with results and instructions on patient voice mail per DPR 

## 2017-07-17 NOTE — Progress Notes (Signed)
Tiffany Horn , March 21, 1960, 57 y.o., female MRN: [id number] Patient Care Team    Relationship Specialty Notifications Start End  [name], DO PCP - General Family Medicine  08/18/14   [name], MD Consulting Physician Gastroenterology  05/29/16   [name], CNM Referring Physician Certified Nurse Midwife  05/29/16   Gari Crown, MD Referring Physician Obstetrics and Gynecology  07/17/17     Chief Complaint  Patient presents with  . lymes disease test     Subjective: Pt presents for an OV with complaints of feeling herself.  She states she has had "severe joint pain "in her fingers and neck to the point where she cannot move them for the last 2 to 3 weeks intermittently.  She is severely exhausted and lightheaded.  She reports she is extremely short of breath and has difficulty taking her trash to the curb.  She reports she is taking herself off of her depression medications because she does not feel that was helping.  She is in counseling, and feels her counselor is helping her.  She has thought the help of a holistic med doctor who performed an electrodermal scanning device by hand held device and told her she potentially has lyme disease, but the rest of the report is pending.  She is very concerned she has a Lyme disease.  She has follow up with them next week.  She denies fever, chills, nausea, vomit, unintentional weight loss, cough or chest pain.  She endorses compliance with her Synthroid.  She had a cardiac CT score completed 04/06/2017 resulted in a score of zero.  A 4 mm lung nodule right upper lobe, 3 mm adjacent right upper lobe nodule, 4 mm right middle lobe nodule was found.  Radiology recommendations are no further work-up needed unless patient is of high risk.  Cardiac CT score 04/06/2017: CLINICAL DATA:  Hyperlipidemia  EXAM: CT HEART FOR CALCIUM SCORING TECHNIQUE: CT heart was performed on a 64 channel system using prospective ECG gating. A  non-contrast exam for calcium scoring was performed. Note that this exam targets the heart and the chest was not imaged in its entirety. COMPARISON:  None. FINDINGS: Technical quality: Good. CORONARY CALCIUM Total Agatston Score: 0 MESA database percentile:  0  OTHER FINDINGS:  Cardiovascular: Heart is normal size. Visualized aorta is normal caliber. Mediastinum/Nodes: No adenopathy in the lower mediastinum or hila. Lungs/Pleura: 4 mm nodule in the inferior right upper lobe on image 21. Adjacent right upper lobe nodule on image 19 measures 3 mm. Right middle lobe nodule peripherally measures 4 mm on image 41. No effusions.  Upper Abdomen: Imaging into the upper abdomen shows no acute findings.  Musculoskeletal: Chest wall soft tissues are unremarkable. No acute bony abnormality.  IMPRESSION: No visible coronary artery calcifications. Total coronary calcium score of 0.  Small right lung nodules as above. No follow-up needed if patient is low-risk (and has no known or suspected primary neoplasm). Non-contrast chest CT can be considered in 12 months if patient is high-risk. This recommendation follows the consensus statement: Guidelines for Management of Incidental Pulmonary Nodules Detected on CT Images: From the Fleischner Society 2017; Radiology 2017; 284:228-243.  Depression screen [facility] 2/9 03/06/2017 09/29/2016 05/29/2016 09/01/2014  Decreased Interest 2 1 0 3  Down, Depressed, Hopeless 2 3 0 3  PHQ - 2 Score 4 4 0 6  Altered sleeping 2 0 - 3  Tired, decreased energy 3 3 - 3  Change in appetite 0  0 - 3  Feeling bad or failure about yourself  0 0 - 3  Trouble concentrating 1 2 - 3  Moving slowly or fidgety/restless 0 2 - 0  Suicidal thoughts 0 0 - 0  PHQ-9 Score 10 11 - 21    Allergies  Allergen Reactions  . Amoxicillin Diarrhea  . Seroquel [Quetiapine Fumarate]     Unknown   Social History   Tobacco Use  . Smoking status: Passive Smoke Exposure - Never  Smoker  . Smokeless tobacco: Never Used  . Tobacco comment: as a very young child  Substance Use Topics  . Alcohol use: Yes    Alcohol/week: 0.6 - 1.2 oz    Types: 1 - 2 Glasses of wine per week   Past Medical History:  Diagnosis Date  . Anxiety   . Arthritis    knees and spine, shoulder  . Bronchitis    hx - recurrent  . Complication of anesthesia    waking up is not easy  . Depression   . H/O miscarriage, not currently pregnant   . Hx of irritable bowel syndrome    x2  . Hyperlipidemia    diet controlled - no medication  . Hypertension    not taking any meds at present - under control per patient  . Hypokalemia    with PNA admission (2.5)  . Migraines   . Pneumonia    4 episodes; hosp. admission 2014  . PONV (postoperative nausea and vomiting)   . Thyroid disease    Past Surgical History:  Procedure Laterality Date  . BREAST SURGERY     implants, then had them removed  . COLONOSCOPY     greater 10 yrs ago - ? [place]    . LAPAROSCOPIC ABDOMINAL EXPLORATION  1994   endometriosis  . ORIF HUMERUS FRACTURE Left 04/01/2013   [facility] - shoulder  . ORIF HUMERUS FRACTURE Left 04/01/2013   Procedure: OPEN REDUCTION INTERNAL FIXATION (ORIF) LEFT PROXIMAL HUMERUS FRACTURE;  Surgeon: Mcarthur Rossetti, MD;  Location: [facility];  Service: Orthopedics;  Laterality: Left;  . TONSILLECTOMY AND ADENOIDECTOMY     Family History  Problem Relation Age of Onset  . Breast cancer Mother   . Diabetes Mother   . Heart disease Mother   . Diabetes Father   . Dementia Father   . Colon cancer Father   . Stroke Paternal Grandmother   . Diabetes Paternal Grandmother    Allergies as of 07/17/2017      Reactions   Amoxicillin Diarrhea   Seroquel [quetiapine Fumarate]    Unknown      Medication List        Accurate as of 07/17/17 11:59 PM. Always use your most recent med list.          B-12 PO Take by mouth daily.   buPROPion  150 MG 24 hr tablet Commonly known as:  WELLBUTRIN XL Take 1 tablet (150 mg total) by mouth daily.   clonazePAM 0.5 MG tablet Commonly known as:  KLONOPIN TAKE 1 TABLET BY MOUTH 3 TIMES A DAY AS NEEDED FOR ANXIETY   levothyroxine 100 MCG tablet Commonly known as:  SYNTHROID, LEVOTHROID Take 1 tablet (100 mcg total) by mouth daily.   rizatriptan 5 MG tablet Commonly known as:  MAXALT Take 1 tablet (5 mg total) by mouth as needed for migraine. May repeat in 2 hours if needed   sertraline 100 MG tablet  Commonly known as:  ZOLOFT Take 1 tablet (100 mg total) by mouth daily.       All past medical history, surgical history, allergies, family history, immunizations andmedications were updated in the EMR today and reviewed under the history and medication portions of their EMR.     ROS: Negative, with the exception of above mentioned in HPI   Objective:  BP 116/62 (BP Location: Right Arm, Patient Position: Sitting, Cuff Size: Large)   Pulse 92   Resp 16   Ht 5\' 6"  (1.676 m)   Wt 183 lb (83 kg)   LMP 01/31/2007   SpO2 98%   BMI 29.54 kg/m  Body mass index is 29.54 kg/m. Gen: Afebrile. No acute distress. Nontoxic in appearance, well developed, well nourished.  HENT: AT. [place].  MMM, no oral lesions. Bilateral nares without erythema or swelling. Throat without erythema or exudates.  No cough, no hoarseness. Eyes:Pupils Equal Round Reactive to light, Extraocular movements intact,  Conjunctiva without redness, discharge or icterus. Neck/lymp/endocrine: Supple, no lymphadenopathy CV: RRR no murmur, no edema Chest: CTAB, no wheeze or crackles. Good air movement, normal resp effort.  Abd: Soft. NTND. BS present.  No masses palpated. No rebound or guarding.  MSK: No obvious deformities, no soft tissue swelling.  Full range of motion. Skin: No rashes, purpura or petechiae.  Neuro:  Normal gait. PERLA. EOMi. Alert. Oriented x3  Psych: Mildly anxious and tearful at times.  Normal affect,  dress and demeanor. Normal speech. Normal thought content and judgment.  No exam data present No results found. No results found for this or any previous visit (from the past 24 hour(s)).  Assessment/Plan: Latissa Frick is a 57 y.o. female present for OV for  Dizziness/polyarthralgia/decreased stamina/dyspnea on exertion -Symptoms could be related to her thyroid disorder versus psychosomatic presentation from her undertreated depression with anxiety.  She reports she has stopped the majority of all of her depression medications and has undergone holistic work-up.  Advised her to proceed with caution with holistic "scans".  There is no evidence-based medicine supporting these handheld scans.  There is many studies showing the scans are not accurate or useful. -We will proceed with further evaluation ruling out Lyme disease caudal autoimmune diseases and inflammatory diseases. - CBC w/Diff - Iron, TIBC and Ferritin Panel - Rheumatoid Factor - C-reactive protein - Sedimentation rate - Cyclic Citrul Peptide Antibody, IGG - B. burgdorfi antibodies - Antinuclear Antib (ANA) - TSH - DG Chest 2 View; Future - Antinuclear Antib (ANA)  Hypothyroidism, unspecified type Constellation of her symptoms could be secondary to hypothyroidism, last level was checked about a year ago.  Will recheck today and if needed alter dose. - TSH   Reviewed expectations re: course of current medical issues.  Discussed self-management of symptoms.  Outlined signs and symptoms indicating need for more acute intervention.  Patient verbalized understanding and all questions were answered.  Patient received an After-Visit Summary.    Orders Placed This Encounter  Procedures  . DG Chest 2 View  . Rheumatoid Factor  . TSH  . C-reactive protein  . Sedimentation rate  . Cyclic Citrul Peptide Antibody, IGG  . B. burgdorfi antibodies  . CBC w/Diff  . Iron, TIBC and Ferritin Panel  . Antinuclear Antib (ANA)      Note is dictated utilizing voice recognition software. Although note has been proof read prior to signing, occasional typographical errors still can be missed. If any questions arise, please do not hesitate to call for  verification.   electronically signed by:  Howard Pouch, DO  [place]

## 2017-07-17 NOTE — Telephone Encounter (Signed)
Please inform patient the following information: cxr is without cause for her reports of shortness of breath. We will call her within the next few days after we get her labs back, they will take up to a week to get some of those results.

## 2017-07-18 LAB — TSH: TSH: 5.83 u[IU]/mL — ABNORMAL HIGH (ref 0.35–4.50)

## 2017-07-19 ENCOUNTER — Telehealth: Payer: Self-pay | Admitting: Family Medicine

## 2017-07-19 DIAGNOSIS — E039 Hypothyroidism, unspecified: Secondary | ICD-10-CM

## 2017-07-19 LAB — IRON,TIBC AND FERRITIN PANEL
%SAT: 26 % (calc) (ref 16–45)
Ferritin: 62 ng/mL (ref 16–232)
Iron: 92 ug/dL (ref 45–160)
TIBC: 349 mcg/dL (calc) (ref 250–450)

## 2017-07-19 LAB — RHEUMATOID FACTOR: Rhuematoid fact SerPl-aCnc: <14 IU/mL (ref <14)

## 2017-07-19 LAB — ANA: Anti Nuclear Antibody(ANA): NEGATIVE

## 2017-07-19 LAB — CYCLIC CITRUL PEPTIDE ANTIBODY, IGG: Cyclic Citrullin Peptide Ab: <16 UNITS

## 2017-07-19 LAB — B. BURGDORFI ANTIBODIES: B burgdorferi Ab IgG+IgM: <0.9 index

## 2017-07-19 MED ORDER — LEVOTHYROXINE SODIUM 112 MCG PO TABS: 112.0000 ug | ORAL_TABLET | Freq: Every day | ORAL | 1 refills | Status: DC

## 2017-07-19 NOTE — Telephone Encounter (Signed)
Spoke with patient reviewed lab results and instructions. Patient verbalized understanding. 

## 2017-07-19 NOTE — Telephone Encounter (Signed)
Please inform patient the following information: Have returned and they do offer some good news.  Labs are all normal with the exception of her thyroid function is underreplaced.  I have increased her thyroid medicine to the next highest dose.  Follow-up in 10-12 weeks to see how she is feeling on higher dose.  Hopefully getting her thyroid levels back in normal range will help relieve her of her symptoms. -Her iron levels are normal.  Her inflammatory markers are negative.  He is not anemic.  Her cell counts are normal.  Her ANA which is the autoimmune marker is negative/normal.  The 2 markers for rheumatoid are also both negative. Her Lyme disease panel is negative.

## 2017-07-24 ENCOUNTER — Encounter: Payer: Self-pay | Admitting: Family Medicine

## 2017-09-06 NOTE — Progress Notes (Signed)
Synopsis: Referred in August 2019 for cough by [name], DO  Subjective:   PATIENT ID: Tiffany Horn GENDER: female DOB: Jul 07, 1960, MRN: [id number]  Chief Complaint  Patient presents with  . pulmonary consult    self referral for deep cough, fatigue, SOB    For the past several years she routinely gets bronchitis. She has been hospitalized for pneumonia in the past in [place], [place] about 3 years ago. 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 She travels a lot back and forth from to her mothers house.   She gets bronchitis at least 2 times per year, always in Oct or march and April. She is usually treated with levaquin and stays away from prednisone if possible because it make her irritable. This has been going on and off for at least 30 years. No known allergies, never seen an allergist. Routinely has a deep cough for the past month. Cool and out of the heat and humidity make it better. Going up and down the steps and exertion make it worse. Out side in the heat makes her cough and sob worse. Routinely wheezes.   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   Pets: 12 dogs and 1 cat, combination of in and out Allergies: unknown Occupation: retired Surveyor, quantity: lives alone, no heavy dust or fume exposure, cleans every day with [employer] cleaning solution  Travel: no recent international travel     Past Medical History:  Diagnosis Date  . Anxiety   . Arthritis    knees and spine, shoulder  . Bronchitis    hx - recurrent  . Complication of anesthesia    waking up is not easy  . Depression   . H/O miscarriage, not currently pregnant   . Hx of irritable bowel syndrome    x2  . Hyperlipidemia    diet controlled - no medication  . Hypertension    not taking any meds at present - under control per patient  . Hypokalemia    with PNA admission (2.5)  . Migraines   . Pneumonia    4 episodes; hosp. admission 2014  . PONV  (postoperative nausea and vomiting)   . Thyroid disease      Family History  Problem Relation Age of Onset  . Breast cancer Mother   . Diabetes Mother   . Heart disease Mother   . Diabetes Father   . Dementia Father   . Colon cancer Father   . Stroke Paternal Grandmother   . Diabetes Paternal Grandmother      Social History   Socioeconomic History  . Marital status: Divorced    Spouse name: Not on file  . Number of children: 0  . Years of education: Not on file  . Highest education level: Not on file  Occupational History  . Occupation: Control and instrumentation engineer  Social Needs  . Financial resource strain: Not on file  . Food insecurity:    Worry: Not on file    Inability: Not on file  . Transportation needs:    Medical: Not on file    Non-medical: Not on file  Tobacco Use  . Smoking status: Passive Smoke Exposure - Never Smoker  . Smokeless tobacco: Never Used  . Tobacco comment: as a very young child  Substance and Sexual Activity  . Alcohol use: Yes    Alcohol/week: 1.0 - 2.0 standard drinks  Types: 1 - 2 Glasses of wine per week  . Drug use: No  . Sexual activity: Never    Partners: Male    Birth control/protection: Post-menopausal  Lifestyle  . Physical activity:    Days per week: Not on file    Minutes per session: Not on file  . Stress: Not on file  Relationships  . Social connections:    Talks on phone: Not on file    Gets together: Not on file    Attends religious service: Not on file    Active member of club or organization: Not on file    Attends meetings of clubs or organizations: Not on file    Relationship status: Not on file  . Intimate partner violence:    Fear of current or ex partner: Not on file    Emotionally abused: Not on file    Physically abused: Not on file    Forced sexual activity: Not on file  Other Topics Concern  . Not on file  Social History Narrative   She is originally from [facility]. She has traveled to [facility], [place], [place], [place],  [place], [place], [place], [place], [place]. No international travel. She has dogs. No prior bird, mold, or recent hot tub exposure. She hasn't used her hot tub in 1.5 years. 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 Previously enjoyed gardening and playing tennis. Helps to care for her mother.     Allergies  Allergen Reactions  . Amoxicillin Diarrhea  . Seroquel [Quetiapine Fumarate]     Unknown     Outpatient Medications Prior to Visit  Medication Sig Dispense Refill  . buPROPion (WELLBUTRIN XL) 150 MG 24 hr tablet Take 1 tablet (150 mg total) by mouth daily. 90 tablet 1  . clonazePAM (KLONOPIN) 0.5 MG tablet TAKE 1 TABLET BY MOUTH 3 TIMES A DAY AS NEEDED FOR ANXIETY  0  . levothyroxine (SYNTHROID, LEVOTHROID) 112 MCG tablet Take 1 tablet (112 mcg total) by mouth daily. 90 tablet 1  . rizatriptan (MAXALT) 5 MG tablet Take 1 tablet (5 mg total) by mouth as needed for migraine. May repeat in 2 hours if needed 10 tablet 4  . sertraline (ZOLOFT) 100 MG tablet Take 1 tablet (100 mg total) by mouth daily. 90 tablet 1  . Cyanocobalamin (B-12 PO) Take by mouth daily.     No facility-administered medications prior to visit.     Review of Systems  Constitutional: Negative.   HENT: Negative.   Eyes: Negative.   Respiratory: Positive for cough, shortness of breath and wheezing.   Cardiovascular: Negative.   Gastrointestinal: Negative.   Genitourinary: Negative.   Musculoskeletal: Negative.   Skin: Negative.   Neurological: Negative.   Endo/Heme/Allergies: Negative.   Psychiatric/Behavioral: Negative.      Objective:  Physical Exam  Constitutional: She is oriented to person, place, and time. She appears well-developed and well-nourished. No distress.  HENT:  Head: Normocephalic and atraumatic.  Mouth/Throat: Oropharynx is clear and moist.  Right nasal polyp?, erythema present   Eyes: Pupils are equal, round, and reactive to light. Conjunctivae are  normal. No scleral icterus.  Neck: Neck supple. No JVD present. No tracheal deviation present.  Cardiovascular: Normal rate, regular rhythm, normal heart sounds and intact distal pulses.  No murmur heard. Pulmonary/Chest: Effort normal and breath sounds normal. No accessory muscle usage or stridor. No tachypnea. No respiratory distress. She has no wheezes. She has no  rhonchi. She has no rales.  Abdominal: Soft. Bowel sounds are normal. She exhibits no distension. There is no tenderness.  Musculoskeletal: She exhibits no edema or tenderness.  Lymphadenopathy:    She has no cervical adenopathy.  Neurological: She is alert and oriented to person, place, and time.  Skin: Skin is warm and dry. Capillary refill takes less than 2 seconds. No rash noted.  Psychiatric: She has a normal mood and affect. Her behavior is normal.  Vitals reviewed.   Vitals:   09/07/17 1527  BP: 128/62  Pulse: 96  SpO2: 96%  Weight: 184 lb (83.5 kg)  Height: 5' 6.5" (1.689 m)   96% on RA BMI Readings from Last 3 Encounters:  09/07/17 29.25 kg/m  07/17/17 29.54 kg/m  03/06/17 28.75 kg/m   Wt Readings from Last 3 Encounters:  09/07/17 184 lb (83.5 kg)  07/17/17 183 lb (83 kg)  03/06/17 178 lb 1.9 oz (80.8 kg)   CBC    Component Value Date/Time   WBC 5.5 07/17/2017 1210   RBC 4.86 07/17/2017 1210   HGB 14.8 07/17/2017 1210   HCT 43.3 07/17/2017 1210   PLT 242.0 07/17/2017 1210   MCV 89.1 07/17/2017 1210   MCV 91.9 03/14/2013 1042   MCH 29.3 06/15/2015 1040   MCHC 34.2 07/17/2017 1210   RDW 13.6 07/17/2017 1210   LYMPHSABS 1.5 07/17/2017 1210   MONOABS 0.5 07/17/2017 1210   EOSABS 0.2 07/17/2017 1210   BASOSABS 0.1 07/17/2017 1210   Chest Imaging: CXR from 07/17/2017 - The patients images have been independently reviewed by me.  There is evidence of thoracic vertebral height loss at T12  Pulmonary Functions Testing Results: No results found for: FEV1, FVC, FEV1FVC, TLC, DLCO  FeNO: None    Pathology: None   Echocardiogram: None   Heart Catheterization: None     Assessment & Plan:   Cough - Plan: montelukast (SINGULAIR) 10 MG tablet, albuterol (PROVENTIL HFA;VENTOLIN HFA) 108 (90 Base) MCG/ACT inhaler  Wheezing - Plan: Pulmonary Function Test, CBC w/Diff, Resp Allergy Profile Regn2DC [place] MD [place], IgG, IgA, IgM, Nitric oxide, IgE, montelukast (SINGULAIR) 10 MG tablet, albuterol (PROVENTIL HFA;VENTOLIN HFA) 108 (90 Base) MCG/ACT inhaler  SOB (shortness of breath)  Osteoporosis, unspecified osteoporosis type, unspecified pathological fracture presence - Plan: DG Bone Density  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  She does have associated wheezing and shortness of breath triggered by ranges and humidity.  On exam today has a right nasal polyp.  Denies aspirin and sensitivity.  Her history of recurrent infection also questions whether or not she has any underlying immunodeficiency.  We will send today for blood work to include CBC with differential, RAST plus IgE, total immunoglobulins. -Total immunoglobulin panel to address any underlying immunodeficiency We will check full PFTs, FeNO in office today We will give albuterol inhaler with spacer to use with shortness of breath and wheezing episodes Will order DEXA scan to evaluate thoracic vertebral height loss as seen on chest x-ray. We will start Singulair 10 mg daily Can start over-the-counter vitamin D supplementation 2000 IU daily. Return to clinic in 4 weeks   Current Outpatient Medications:  .  buPROPion (WELLBUTRIN XL) 150 MG 24 hr tablet, Take 1 tablet (150 mg total) by mouth daily., Disp: 90 tablet, Rfl: 1 .  clonazePAM (KLONOPIN) 0.5 MG tablet, TAKE 1 TABLET BY MOUTH  3 TIMES A DAY AS NEEDED FOR ANXIETY, Disp: , Rfl: 0 .  levothyroxine (SYNTHROID, LEVOTHROID) 112 MCG tablet, Take 1 tablet (112 mcg total) by mouth daily., Disp: 90 tablet, Rfl: 1 .  rizatriptan (MAXALT) 5 MG tablet, Take 1 tablet (5 mg total) by mouth as needed for migraine. May repeat in 2 hours if needed, Disp: 10 tablet, Rfl: 4 .  sertraline (ZOLOFT) 100 MG tablet, Take 1 tablet (100 mg total) by mouth daily., Disp: 90 tablet, Rfl: 1 .  albuterol (PROVENTIL HFA;VENTOLIN HFA) 108 (90 Base) MCG/ACT inhaler, Inhale 2 puffs into the lungs every 6 (six) hours as needed for wheezing or shortness of breath., Disp: 1 Inhaler, Rfl: 6 .  montelukast (SINGULAIR) 10 MG tablet, Take 1 tablet (10 mg total) by mouth at bedtime., Disp: 30 tablet, Rfl: [place], DO [facility] 09/07/2017 4:22 PM

## 2017-09-07 ENCOUNTER — Other Ambulatory Visit (INDEPENDENT_AMBULATORY_CARE_PROVIDER_SITE_OTHER): Payer: BC Managed Care – PPO

## 2017-09-07 ENCOUNTER — Encounter: Payer: Self-pay | Admitting: Pulmonary Disease

## 2017-09-07 ENCOUNTER — Ambulatory Visit: Payer: BC Managed Care – PPO | Admitting: Pulmonary Disease

## 2017-09-07 VITALS — BP 128/62 | HR 96 | Ht 66.5 in | Wt 184.0 lb

## 2017-09-07 DIAGNOSIS — R05 Cough: Secondary | ICD-10-CM

## 2017-09-07 DIAGNOSIS — R059 Cough, unspecified: Secondary | ICD-10-CM

## 2017-09-07 DIAGNOSIS — R062 Wheezing: Secondary | ICD-10-CM | POA: Diagnosis not present

## 2017-09-07 DIAGNOSIS — J339 Nasal polyp, unspecified: Secondary | ICD-10-CM

## 2017-09-07 DIAGNOSIS — R0602 Shortness of breath: Secondary | ICD-10-CM | POA: Diagnosis not present

## 2017-09-07 DIAGNOSIS — M810 Age-related osteoporosis without current pathological fracture: Secondary | ICD-10-CM | POA: Diagnosis not present

## 2017-09-07 DIAGNOSIS — M4854XD Collapsed vertebra, not elsewhere classified, thoracic region, subsequent encounter for fracture with routine healing: Secondary | ICD-10-CM

## 2017-09-07 LAB — NITRIC OXIDE: Nitric Oxide: 5

## 2017-09-07 LAB — CBC WITH DIFFERENTIAL/PLATELET
Basophils Absolute: 0.1 10*3/uL (ref 0.0–0.1)
Basophils Relative: 1.4 % (ref 0.0–3.0)
Eosinophils Absolute: 0.2 10*3/uL (ref 0.0–0.7)
Eosinophils Relative: 3.7 % (ref 0.0–5.0)
HCT: 41.4 % (ref 36.0–46.0)
Hemoglobin: 14.2 g/dL (ref 12.0–15.0)
Lymphocytes Relative: 37.2 % (ref 12.0–46.0)
Lymphs Abs: 2.4 10*3/uL (ref 0.7–4.0)
MCHC: 34.3 g/dL (ref 30.0–36.0)
MCV: 87.5 fl (ref 78.0–100.0)
Monocytes Absolute: 0.5 10*3/uL (ref 0.1–1.0)
Monocytes Relative: 7.3 % (ref 3.0–12.0)
Neutro Abs: 3.2 10*3/uL (ref 1.4–7.7)
Neutrophils Relative %: 50.4 % (ref 43.0–77.0)
Platelets: 239 10*3/uL (ref 150.0–400.0)
RBC: 4.74 Mil/uL (ref 3.87–5.11)
RDW: 13.1 % (ref 11.5–15.5)
WBC: 6.4 10*3/uL (ref 4.0–10.5)

## 2017-09-07 MED ORDER — MONTELUKAST SODIUM 10 MG PO TABS: 10.0000 mg | ORAL_TABLET | Freq: Every day | ORAL | 11 refills | Status: DC

## 2017-09-07 MED ORDER — ALBUTEROL SULFATE HFA 108 (90 BASE) MCG/ACT IN AERS: 2.0000 | INHALATION_SPRAY | Freq: Four times a day (QID) | RESPIRATORY_TRACT | 6 refills | Status: DC | PRN

## 2017-09-07 NOTE — Patient Instructions (Addendum)
We will send you today for blood work to include CBC with differential, RAST plus IgE, total immunoglobulins. We will check full PFTs, FeNO in office today We will give albuterol inhaler with spacer to use with shortness of breath and wheezing episodes Will order DEXA scan to evaluate thoracic vertebral height loss as seen on chest x-ray We will start Singulair 10 mg daily

## 2017-09-10 LAB — RESPIRATORY ALLERGY PROFILE REGION II ~~LOC~~
Allergen, A. alternata, m6: 1.3 kU/L — ABNORMAL HIGH
Allergen, Cedar tree, t12: 0.48 kU/L — ABNORMAL HIGH
Allergen, Comm Silver Birch, t9: <0.1 kU/L
Allergen, Cottonwood, t14: 0.13 kU/L — ABNORMAL HIGH
Allergen, D pternoyssinus,d7: <0.1 kU/L
Allergen, Mouse Urine Protein, e78: <0.1 kU/L
Allergen, Mulberry, t76: <0.1 kU/L
Allergen, Oak,t7: <0.1 kU/L
Allergen, P. notatum, m1: <0.1 kU/L
Aspergillus fumigatus, m3: <0.1 kU/L
Bermuda Grass: 0.56 kU/L — ABNORMAL HIGH
Box Elder IgE: <0.1 kU/L
CLADOSPORIUM HERBARUM (M2) IGE: <0.1 kU/L
COMMON RAGWEED (SHORT) (W1) IGE: 0.11 kU/L — ABNORMAL HIGH
Cat Dander: <0.1 kU/L
Class: 0
Class: 0
Class: 0
Class: 0
Class: 0
Class: 0
Class: 0
Class: 0
Class: 0
Class: 0
Class: 0
Class: 0
Class: 0
Class: 0
Class: 0
Class: 0
Class: 0
Class: 0
Class: 0
Class: 1
Class: 1
Class: 2
Class: 2
Class: 2
Cockroach: <0.1 kU/L
D. farinae: <0.1 kU/L
Dog Dander: <0.1 kU/L
Elm IgE: <0.1 kU/L
IgE (Immunoglobulin E), Serum: 195 kU/L — ABNORMAL HIGH (ref <=114)
Johnson Grass: 0.72 kU/L — ABNORMAL HIGH
Pecan/Hickory Tree IgE: <0.1 kU/L
Rough Pigweed  IgE: <0.1 kU/L
Sheep Sorrel IgE: <0.1 kU/L
Timothy Grass: 1.44 kU/L — ABNORMAL HIGH

## 2017-09-10 LAB — IGG, IGA, IGM
IgG (Immunoglobin G), Serum: 1041 mg/dL (ref 600–1640)
IgM, Serum: 137 mg/dL (ref 50–300)
Immunoglobulin A: 160 mg/dL (ref 47–310)

## 2017-09-10 LAB — INTERPRETATION:

## 2017-09-11 ENCOUNTER — Telehealth: Payer: Self-pay | Admitting: Pulmonary Disease

## 2017-09-11 MED ORDER — BUDESONIDE-FORMOTEROL FUMARATE 160-4.5 MCG/ACT IN AERO: 2.0000 | INHALATION_SPRAY | Freq: Two times a day (BID) | RESPIRATORY_TRACT | 3 refills | Status: DC

## 2017-09-11 NOTE — Telephone Encounter (Signed)
Pt calling for lab results from last week. Dr Valeta Harms please advise on results.  Thanks!

## 2017-09-11 NOTE — Telephone Encounter (Signed)
Please let Tiffany Horn know that her labs are consistent with a patient who would have atopic asthma.   Her allergy testing was positive for several things and her IgE level was very elevated.   She needs to let us know if her breathing, sob, or wheezing symptoms worsen.   We can start her on symbicort 160 BID with spacer in addition to the therapies started during our last visit.   I have placed this order. She can use the same spacer she was given with her albuterol.   Thanks,  Garner Nash, DO [facility] 09/11/2017 1:43 PM  Personal pager: [phone] If unanswered, please page CCM On-call: [phone]

## 2017-09-11 NOTE — Telephone Encounter (Signed)
atc pt, no answer, vm not set up.  Wcb.

## 2017-09-12 ENCOUNTER — Encounter: Payer: Self-pay | Admitting: Pulmonary Disease

## 2017-09-12 ENCOUNTER — Telehealth: Payer: Self-pay | Admitting: Pulmonary Disease

## 2017-09-12 DIAGNOSIS — T7849XA Other allergy, initial encounter: Secondary | ICD-10-CM | POA: Insufficient documentation

## 2017-09-12 DIAGNOSIS — R768 Other specified abnormal immunological findings in serum: Secondary | ICD-10-CM | POA: Insufficient documentation

## 2017-09-12 HISTORY — DX: Other specified abnormal immunological findings in serum: R76.8

## 2017-09-12 MED ORDER — AEROCHAMBER MV MISC: 2 refills | Status: DC

## 2017-09-12 MED ORDER — FLUTICASONE FUROATE-VILANTEROL 200-25 MCG/INH IN AEPB: 1.0000 | INHALATION_SPRAY | Freq: Every day | RESPIRATORY_TRACT | 3 refills | Status: DC

## 2017-09-12 MED ORDER — BUDESONIDE-FORMOTEROL FUMARATE 160-4.5 MCG/ACT IN AERO: 2.0000 | INHALATION_SPRAY | Freq: Two times a day (BID) | RESPIRATORY_TRACT | 5 refills | Status: DC

## 2017-09-12 NOTE — Telephone Encounter (Signed)
Pt is requesting additional details more in depth Her allergy testing that was positive for several things and her IgE level that was very elevated. She requests more details. Also, pt's insurance doesn't cover Symbicort nor the spacer. Is there any other option for the patient besides Symbicort and the spacer.  BI please advise. Thank you.  Attempted to call patient today regarding results. I did not receive an answer at time of call. I have left a voicemail message for pt to return call. X1

## 2017-09-12 NOTE — Telephone Encounter (Signed)
Called and spoke with patient regarding results.  Informed the patient of results and recommendations today. Placed order for spacer and symb 160 inhaler to pharmacy Pt verbalized understanding and denied any questions or concerns at this time.  Nothing further needed.

## 2017-09-12 NOTE — Telephone Encounter (Signed)
PCCM:  I called patient to answer her questions. She was appreciative.   Symbicort d/c'd  Orders placed for BREO 200  Follow up scheduled with me in the office.   Garner Nash, DO [facility] 09/12/2017 4:45 PM  Personal pager: [phone] If unanswered, please page CCM On-call: [phone]

## 2017-09-27 ENCOUNTER — Telehealth: Payer: Self-pay | Admitting: Pulmonary Disease

## 2017-09-27 NOTE — Telephone Encounter (Signed)
Called and spoke to patient. Patient stated that she saw Dr. Valeta Harms recently and was diagnosed with asthma. Patient stated that she was started on singulair and inhalers. Patient stated that she was told if she had new symptoms to call. Patient reports that she is experiencing increased chest tightness but inhaler helps, increased heartburn, increased productive cough, increased fatigue and now has bone pain.  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 Scheduled patient for appointment with NP tomorrow, 09/28/17. Nothing further needed at this time.

## 2017-09-28 ENCOUNTER — Ambulatory Visit: Payer: BC Managed Care – PPO | Admitting: Primary Care

## 2017-10-10 ENCOUNTER — Ambulatory Visit: Payer: BC Managed Care – PPO | Admitting: Pulmonary Disease

## 2017-10-10 ENCOUNTER — Ambulatory Visit (INDEPENDENT_AMBULATORY_CARE_PROVIDER_SITE_OTHER): Payer: BC Managed Care – PPO | Admitting: Pulmonary Disease

## 2017-10-10 ENCOUNTER — Encounter: Payer: Self-pay | Admitting: Pulmonary Disease

## 2017-10-10 VITALS — BP 132/88 | HR 85 | Ht 64.0 in | Wt 181.4 lb

## 2017-10-10 DIAGNOSIS — R062 Wheezing: Secondary | ICD-10-CM

## 2017-10-10 DIAGNOSIS — J4540 Moderate persistent asthma, uncomplicated: Secondary | ICD-10-CM

## 2017-10-10 DIAGNOSIS — R768 Other specified abnormal immunological findings in serum: Secondary | ICD-10-CM

## 2017-10-10 DIAGNOSIS — T7849XA Other allergy, initial encounter: Secondary | ICD-10-CM

## 2017-10-10 LAB — PULMONARY FUNCTION TEST
DL/VA % pred: 94 %
DL/VA: 4.56 ml/min/mmHg/L
DLCO cor % pred: 104 %
DLCO cor: 25.29 ml/min/mmHg
DLCO unc % pred: 106 %
DLCO unc: 25.89 ml/min/mmHg
FEF 25-75 Post: 2.72 L/sec
FEF 25-75 Pre: 2.54 L/sec
FEF2575-%Change-Post: 7 %
FEF2575-%Pred-Post: 106 %
FEF2575-%Pred-Pre: 99 %
FEV1-%Change-Post: 2 %
FEV1-%Pred-Post: 103 %
FEV1-%Pred-Pre: 100 %
FEV1-Post: 2.76 L
FEV1-Pre: 2.7 L
FEV1FVC-%Change-Post: 7 %
FEV1FVC-%Pred-Pre: 101 %
FEV6-%Change-Post: -5 %
FEV6-%Pred-Post: 96 %
FEV6-%Pred-Pre: 101 %
FEV6-Post: 3.2 L
FEV6-Pre: 3.37 L
FEV6FVC-%Change-Post: 0 %
FEV6FVC-%Pred-Post: 102 %
FEV6FVC-%Pred-Pre: 103 %
FVC-%Change-Post: -5 %
FVC-%Pred-Post: 93 %
FVC-%Pred-Pre: 98 %
FVC-Post: 3.2 L
FVC-Pre: 3.37 L
Post FEV1/FVC ratio: 86 %
Post FEV6/FVC ratio: 100 %
Pre FEV1/FVC ratio: 80 %
Pre FEV6/FVC Ratio: 100 %
RV % pred: 97 %
RV: 1.84 L
TLC % pred: 123 %
TLC: 6.26 L

## 2017-10-10 MED ORDER — TIOTROPIUM BROMIDE MONOHYDRATE 1.25 MCG/ACT IN AERS: 2.0000 | INHALATION_SPRAY | Freq: Every day | RESPIRATORY_TRACT | 5 refills | Status: DC

## 2017-10-10 NOTE — Progress Notes (Signed)
Synopsis: Referred in August 2019 for cough by [name], DO  Subjective:   PATIENT ID: Tiffany Horn GENDER: female DOB: 1960-06-03, MRN: [id number]  Chief Complaint  Patient presents with  . Follow-up    PFT today, cough remains the same, some wheezing during PFT.     For the past several years she routinely gets bronchitis. She has been hospitalized for pneumonia in the past in [place], [place] about 3 years ago. 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 She travels a lot back and forth from to her mothers house.   She gets bronchitis at least 2 times per year, always in Oct or march and April. She is usually treated with levaquin and stays away from prednisone if possible because it make her irritable. This has been going on and off for at least 30 years. No known allergies, never seen an allergist. Routinely has a deep cough for the past month. Cool and out of the heat and humidity make it better. Going up and down the steps and exertion make it worse. Out side in the heat makes her cough and sob worse. Routinely wheezes. 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   Pets: 12 dogs and 1 cat, combination of in and out Allergies: unknown Occupation: retired Surveyor, quantity: lives alone, no heavy dust or fume exposure, cleans every day with [employer] cleaning solution  Travel: no recent international travel   OV: 10/10/2017  Feeling better since her last visit. With significant exertion she feels winded. When carrying heavy stuff she feels SOB. She is using her albuterol still a few times per day. She has cough, dry and feels fatigued. She does have GERD and occasionally taking her PPI. She only has been using it as needed.  Overall it seems as if her symptomatology is not fully controlled.  She does admit to occasional nocturnal symptoms, nocturnal cough.  Denies fevers, sputum production.   Past Medical History:  Diagnosis Date  . Anxiety    . Arthritis    knees and spine, shoulder  . Bronchitis    hx - recurrent  . Complication of anesthesia    waking up is not easy  . Depression   . Elevated IgE level 09/12/2017   09/12/2017 IgE 195  . H/O miscarriage, not currently pregnant   . Hx of irritable bowel syndrome    x2  . Hyperlipidemia    diet controlled - no medication  . Hypertension    not taking any meds at present - under control per patient  . Hypokalemia    with PNA admission (2.5)  . Migraines   . Pneumonia    4 episodes; hosp. admission 2014  . PONV (postoperative nausea and vomiting)   . Thyroid disease      Family History  Problem Relation Age of Onset  . Breast cancer Mother   . Diabetes Mother   . Heart disease Mother   . Diabetes Father   . Dementia Father   . Colon cancer Father   . Stroke Paternal Grandmother   . Diabetes Paternal Grandmother      Social History   Socioeconomic History  . Marital status: Divorced    Spouse name: Not on file  . Number of children: 0  . Years of education: Not on file  . Highest education level: Not on file  Occupational History  . Occupation: Control and instrumentation engineer  Social Needs  . Financial resource strain: Not on file  . Food insecurity:    Worry: Not on file    Inability: Not on file  . Transportation needs:    Medical: Not on file    Non-medical: Not on file  Tobacco Use  . Smoking status: Passive Smoke Exposure - Never Smoker  . Smokeless tobacco: Never Used  . Tobacco comment: as a very young child  Substance and Sexual Activity  . Alcohol use: Yes    Alcohol/week: 1.0 - 2.0 standard drinks    Types: 1 - 2 Glasses of wine per week  . Drug use: No  . Sexual activity: Never    Partners: Male    Birth control/protection: Post-menopausal  Lifestyle  . Physical activity:    Days per week: Not on file    Minutes per session: Not on file  . Stress: Not on file  Relationships  . Social connections:    Talks on phone: Not on file    Gets  together: Not on file    Attends religious service: Not on file    Active member of club or organization: Not on file    Attends meetings of clubs or organizations: Not on file    Relationship status: Not on file  . Intimate partner violence:    Fear of current or ex partner: Not on file    Emotionally abused: Not on file    Physically abused: Not on file    Forced sexual activity: Not on file  Other Topics Concern  . Not on file  Social History Narrative   She is originally from [facility]. She has traveled to [place], [place], [place], [place], [place], [place], [place], [place], [place]. No international travel. She has dogs. No prior bird, mold, or recent hot tub exposure. She hasn't used her hot tub in 1.5 years. 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 Previously enjoyed gardening and playing tennis. Helps to care for her mother.     Allergies  Allergen Reactions  . Amoxicillin Diarrhea  . Seroquel [Quetiapine Fumarate]     Unknown     Outpatient Medications Prior to Visit  Medication Sig Dispense Refill  . albuterol (PROVENTIL HFA;VENTOLIN HFA) 108 (90 Base) MCG/ACT inhaler Inhale 2 puffs into the lungs every 6 (six) hours as needed for wheezing or shortness of breath. 1 Inhaler 6  . clonazePAM (KLONOPIN) 0.5 MG tablet TAKE 1 TABLET BY MOUTH 3 TIMES A DAY AS NEEDED FOR ANXIETY  0  . fluticasone furoate-vilanterol (BREO ELLIPTA) 200-25 MCG/INH AEPB Inhale 1 puff into the lungs daily. 1 each 3  . levothyroxine (SYNTHROID, LEVOTHROID) 112 MCG tablet Take 1 tablet (112 mcg total) by mouth daily. 90 tablet 1  . montelukast (SINGULAIR) 10 MG tablet Take 1 tablet (10 mg total) by mouth at bedtime. 30 tablet 11  . rizatriptan (MAXALT) 5 MG tablet Take 1 tablet (5 mg total) by mouth as needed for migraine. May repeat in 2 hours if needed 10 tablet 4  . Spacer/Aero-Holding Chambers (AEROCHAMBER MV) inhaler Use as instructed 1 each 2  . buPROPion (WELLBUTRIN XL) 150 MG 24  hr tablet Take 1 tablet (150 mg total) by mouth daily. (Patient not taking: Reported on 10/10/2017) 90 tablet 1  . sertraline (ZOLOFT) 100 MG tablet Take 1 tablet (100 mg total) by mouth daily. (Patient not taking: Reported on 10/10/2017) 90 tablet 1   No facility-administered  medications prior to visit.     Review of Systems  Constitutional: Negative.   HENT: Negative.   Eyes: Negative.   Respiratory: Positive for cough, shortness of breath and wheezing.   Cardiovascular: Negative.   Gastrointestinal: Negative.   Genitourinary: Negative.   Musculoskeletal: Negative.   Skin: Negative.   Neurological: Negative.   Endo/Heme/Allergies: Negative.   Psychiatric/Behavioral: Negative.      Objective:  Physical Exam  Constitutional: She is oriented to person, place, and time. She appears well-developed and well-nourished. No distress.  HENT:  Head: Normocephalic and atraumatic.  Mouth/Throat: Oropharynx is clear and moist.  Right nasal polyp, erythema present   Eyes: Pupils are equal, round, and reactive to light. Conjunctivae are normal. No scleral icterus.  Neck: Neck supple. No JVD present. No tracheal deviation present.  Cardiovascular: Normal rate, regular rhythm, normal heart sounds and intact distal pulses.  No murmur heard. Pulmonary/Chest: Effort normal and breath sounds normal. No accessory muscle usage or stridor. No tachypnea. No respiratory distress. She has no wheezes. She has no rhonchi. She has no rales.  Abdominal: Soft. Bowel sounds are normal. She exhibits no distension. There is no tenderness.  Musculoskeletal: She exhibits no edema or tenderness.  Lymphadenopathy:    She has no cervical adenopathy.  Neurological: She is alert and oriented to person, place, and time.  Skin: Skin is warm and dry. Capillary refill takes less than 2 seconds. No rash noted.  Psychiatric: She has a normal mood and affect. Her behavior is normal.  Vitals reviewed.   Vitals:   10/10/17  1458  BP: 132/88  Pulse: 85  SpO2: 97%  Weight: 181 lb 6.4 oz (82.3 kg)  Height: 5\' 4"  (1.626 m)   97% on RA BMI Readings from Last 3 Encounters:  10/10/17 31.14 kg/m  09/07/17 29.25 kg/m  07/17/17 29.54 kg/m   Wt Readings from Last 3 Encounters:  10/10/17 181 lb 6.4 oz (82.3 kg)  09/07/17 184 lb (83.5 kg)  07/17/17 183 lb (83 kg)   CBC    Component Value Date/Time   WBC 6.4 09/07/2017 1652   RBC 4.74 09/07/2017 1652   HGB 14.2 09/07/2017 1652   HCT 41.4 09/07/2017 1652   PLT 239.0 09/07/2017 1652   MCV 87.5 09/07/2017 1652   MCV 91.9 03/14/2013 1042   MCH 29.3 06/15/2015 1040   MCHC 34.3 09/07/2017 1652   RDW 13.1 09/07/2017 1652   LYMPHSABS 2.4 09/07/2017 1652   MONOABS 0.5 09/07/2017 1652   EOSABS 0.2 09/07/2017 1652   BASOSABS 0.1 09/07/2017 1652   09/07/2017: Rest testing positive for [place] grass, [place] grass, [place]wood, ragweed, cedar trees IgE: 195 IgG, IgA, IgM: All within normal limits  Chest Imaging: CXR from 07/17/2017 - The patients images have been independently reviewed by me.  There is evidence of thoracic vertebral height loss at T12  Pulmonary Functions Testing Results: 10/10/2017: FVC 3.2 L 93% predicted post BD response FEV1 6 2.76 L, 103% predicted post BD response TLC 123% predicted DLCO 106% predicted  FeNO: None   Pathology: None   Echocardiogram: None   Heart Catheterization: None     Assessment & Plan:   Moderate persistent asthma without complication  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)  Elevated IgE level, 195  Discussion:  57 year old female with moderate persistent asthma symptoms, chronic cough, recurrent episodes of bronchitis, positive regional allergy panel, significantly elevated IgE at 195.  Pulmonary function tests with no significant response to bronchodilator however evidence of air trapping with  TLC 125%, DLCO 106%.  PFTs reviewed today in the office with the patient Continue as needed albuterol  with spacer Continue Breo 200 Continue Singulair daily Continue over-the-counter vitamin D supplementation 2000 IU daily Spiriva 1.81mcg 2 puffs once daily  Start Nexium daily  Start OTC antihistamine (zrytec or allegra)  RTC in 3 months Call if symptoms persist    Current Outpatient Medications:  .  albuterol (PROVENTIL HFA;VENTOLIN HFA) 108 (90 Base) MCG/ACT inhaler, Inhale 2 puffs into the lungs every 6 (six) hours as needed for wheezing or shortness of breath., Disp: 1 Inhaler, Rfl: 6 .  clonazePAM (KLONOPIN) 0.5 MG tablet, TAKE 1 TABLET BY MOUTH 3 TIMES A DAY AS NEEDED FOR ANXIETY, Disp: , Rfl: 0 .  fluticasone furoate-vilanterol (BREO ELLIPTA) 200-25 MCG/INH AEPB, Inhale 1 puff into the lungs daily., Disp: 1 each, Rfl: 3 .  levothyroxine (SYNTHROID, LEVOTHROID) 112 MCG tablet, Take 1 tablet (112 mcg total) by mouth daily., Disp: 90 tablet, Rfl: 1 .  montelukast (SINGULAIR) 10 MG tablet, Take 1 tablet (10 mg total) by mouth at bedtime., Disp: 30 tablet, Rfl: 11 .  rizatriptan (MAXALT) 5 MG tablet, Take 1 tablet (5 mg total) by mouth as needed for migraine. May repeat in 2 hours if needed, Disp: 10 tablet, Rfl: 4 .  Spacer/Aero-Holding Chambers (AEROCHAMBER MV) inhaler, Use as instructed, Disp: 1 each, Rfl: 2   Garner Nash, DO [facility] 10/10/2017 3:19 PM

## 2017-10-10 NOTE — Progress Notes (Signed)
Patient completed full PFT today. Patient began to wheeze throughout the Pleth. Patient did not feel well afterwards.

## 2017-10-10 NOTE — Patient Instructions (Signed)
Spiriva 1.86mcg 2 puffs once daily  Start Nexium daily  Start OTC antihistamine (zrytec or allegra)  RTC in 3 months Call if symptoms persist

## 2017-10-22 ENCOUNTER — Telehealth: Payer: Self-pay | Admitting: Pulmonary Disease

## 2017-10-22 NOTE — Telephone Encounter (Signed)
Called and spoke with patient, patient stated that she was not sure if she needed to get the pneumonia shot. Patient stated that if she needs to have one she would like for the order to be sent to her pharmacy so that she can get it cheaper there unless it would be cheaper here. Dr. Valeta Harms please advise, thank you.

## 2017-10-22 NOTE — Telephone Encounter (Signed)
The PPSV23, 23 valent vaccine would be indicated due to her history of asthma between age 55-95yrs. The recommendations for this is likely changing soon. We can order this for her if she would like.   Thanks  BLI  Garner Nash, DO [facility] 10/22/2017 5:18 PM

## 2017-10-23 MED ORDER — PNEUMOCOCCAL VAC POLYVALENT 25 MCG/0.5ML IJ INJ: 0.5000 mL | INJECTION | INTRAMUSCULAR | 0 refills | Status: AC

## 2017-10-23 NOTE — Telephone Encounter (Signed)
Called and spoke to pt and relayed below recommendations. Rx for PNEUMO 23 has been sent to preferred pharmacy. Nothing further is needed.

## 2017-10-26 ENCOUNTER — Ambulatory Visit: Payer: BC Managed Care – PPO | Admitting: Nurse Practitioner

## 2017-10-30 ENCOUNTER — Telehealth: Payer: Self-pay | Admitting: Pulmonary Disease

## 2017-10-30 ENCOUNTER — Ambulatory Visit (INDEPENDENT_AMBULATORY_CARE_PROVIDER_SITE_OTHER): Payer: BC Managed Care – PPO | Admitting: Psychiatry

## 2017-10-30 ENCOUNTER — Ambulatory Visit
Admission: RE | Admit: 2017-10-30 | Discharge: 2017-10-30 | Disposition: A | Discharge status: Home / Self Care | Payer: BC Managed Care – PPO | Source: Ambulatory Visit | Attending: Pulmonary Disease | Admitting: Pulmonary Disease

## 2017-10-30 DIAGNOSIS — M4854XD Collapsed vertebra, not elsewhere classified, thoracic region, subsequent encounter for fracture with routine healing: Secondary | ICD-10-CM

## 2017-10-30 DIAGNOSIS — F331 Major depressive disorder, recurrent, moderate: Secondary | ICD-10-CM | POA: Diagnosis not present

## 2017-10-30 DIAGNOSIS — M810 Age-related osteoporosis without current pathological fracture: Secondary | ICD-10-CM

## 2017-10-30 MED ORDER — GABAPENTIN 300 MG PO CAPS: 300.0000 mg | ORAL_CAPSULE | Freq: Three times a day (TID) | ORAL | 2 refills | Status: DC

## 2017-10-30 MED ORDER — PAROXETINE HCL 20 MG PO TABS: 20.0000 mg | ORAL_TABLET | Freq: Every day | ORAL | 2 refills | Status: DC

## 2017-10-30 NOTE — Progress Notes (Signed)
[facility]  Patient ID: Tiffany Horn,  MRN: [id number]   PCP: [name], DO  Date of Evaluation: 10/30/2017 Time spent:20 minutes   HISTORY/CURRENT STATUS: HPI 56 yowfemale las seen 06/17. Depression worst ever also anxiety and irritable Taking care of parents with increase in stress. Stopped meds as were not working Crying, irritable anxious Passive suicidal . Decreased sleep, decrease energy and panic attacks. Getting klonopin from pcp suppose to take tid but taking 2 tid.  Individual Medical History/ Review of Systems: Changes? :mom's caregiver is not doing well  Allergies: Amoxicillin and Seroquel [quetiapine fumarate]  Current Medications:  Current Outpatient Medications:  .  albuterol (PROVENTIL HFA;VENTOLIN HFA) 108 (90 Base) MCG/ACT inhaler, Inhale 2 puffs into the lungs every 6 (six) hours as needed for wheezing or shortness of breath., Disp: 1 Inhaler, Rfl: 6 .  clonazePAM (KLONOPIN) 0.5 MG tablet, TAKE 1 TABLET BY MOUTH 3 TIMES A DAY AS NEEDED FOR ANXIETY, Disp: , Rfl: 0 .  fluticasone furoate-vilanterol (BREO ELLIPTA) 200-25 MCG/INH AEPB, Inhale 1 puff into the lungs daily., Disp: 1 each, Rfl: 3 .  levothyroxine (SYNTHROID, LEVOTHROID) 112 MCG tablet, Take 1 tablet (112 mcg total) by mouth daily., Disp: 90 tablet, Rfl: 1 .  montelukast (SINGULAIR) 10 MG tablet, Take 1 tablet (10 mg total) by mouth at bedtime., Disp: 30 tablet, Rfl: 11 .  rizatriptan (MAXALT) 5 MG tablet, Take 1 tablet (5 mg total) by mouth as needed for migraine. May repeat in 2 hours if needed, Disp: 10 tablet, Rfl: 4 .  Spacer/Aero-Holding Chambers (AEROCHAMBER MV) inhaler, Use as instructed, Disp: 1 each, Rfl: 2 .  Tiotropium Bromide Monohydrate (SPIRIVA RESPIMAT) 1.25 MCG/ACT AERS, Inhale 2 puffs into the lungs daily., Disp: 1 Inhaler, Rfl: 5  Medication Side Effects: None  Family Medical/ Social History: Changes? Yes mom in poor health  MENTAL HEALTH EXAM:  Last menstrual  period 01/31/2007.There is no height or weight on file to calculate BMI.  General Appearance: Neat  Eye Contact:  Good  Speech:  Normal Rate  Volume:  Normal  Mood:  Depressed  Affect:  Appropriate  Thought Process:  Coherent  Orientation:  Full (Time, Place, and Person)  Thought Content: Logical   Suicidal Thoughts:  No but passive suicidal thoughts  Homicidal Thoughts:  No  Memory:  Immediate  Judgement:  Good  Insight:  Good  Psychomotor Activity:  Normal  Concentration:  Concentration: Good  Recall:  Good  Fund of Knowledge: Good  Language: Good  Akathisia:  NA  AIMS (if indicated): not done  Assets:  Others:  non applicable  ADL's:  Intact  Cognition: WNL  Prognosis:  Fair    DIAGNOSES:    ICD-10-CM   1. Major depressive disorder, recurrent episode, moderate (HCC) F33.1     RECOMMENDATIONS:start paxil start gabapentin at 300mg  tid Seeing counselor and pcp writing klonopin        Comer Locket, PA-C

## 2017-10-30 NOTE — Telephone Encounter (Signed)
Called patient, unable to reach left message to give us a call back. 

## 2017-10-31 NOTE — Telephone Encounter (Signed)
Pt is calling back [phone]

## 2017-10-31 NOTE — Telephone Encounter (Signed)
Called and spoke with patient, advised her of results from [place] box per Dr. Valeta Harms. Patient verbalized understanding and is aware of results. Patient would like for [place] to go ahead and refer her to endocrinology instead of going to her PCP. Per Dr. Fabio Bering note this is ok to do. Will go ahead and place order. Nothing further needed.

## 2017-11-02 ENCOUNTER — Telehealth: Payer: Self-pay | Admitting: Pulmonary Disease

## 2017-11-02 NOTE — Telephone Encounter (Signed)
Notes recorded by Garner Nash, DO on 10/30/2017 at 2:07 PM EDT [place],   Can you let Tiffany Horn know that she has osteoporosis of the spine and that she needs to take calcium and vitamin D supplements daily. Also, she needs to follow up with her primary to discuss treatment options for her OP to include bisphosphonates. If her PCP does not routinely do this then we could refer her to see endocrinology.  Called and spoke with pt to let her know the reason for the referral to endocrinology. Pt expressed understanding. Order was placed for the endocrinology referral on 10/31/17.  Nothing further needed.

## 2017-11-20 NOTE — Progress Notes (Deleted)
Synopsis: Referred in August 2019 for cough by [name], DO  Subjective:   PATIENT ID: Tiffany Horn GENDER: female DOB: 03/27/1960, MRN: [id number]  No chief complaint on file.   For the past several years she routinely gets bronchitis. She has been hospitalized for pneumonia in the past in [place], [place] about 3 years ago. 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 She travels a lot back and forth from to her mothers house.   She gets bronchitis at least 2 times per year, always in Oct or march and April. She is usually treated with levaquin and stays away from prednisone if possible because it make her irritable. This has been going on and off for at least 30 years. No known allergies, never seen an allergist. Routinely has a deep cough for the past month. Cool and out of the heat and humidity make it better. Going up and down the steps and exertion make it worse. Out side in the heat makes her cough and sob worse. Routinely wheezes. 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   Pets: 12 dogs and 1 cat, combination of in and out Allergies: unknown Occupation: retired Surveyor, quantity: lives alone, no heavy dust or fume exposure, cleans every day with [employer] cleaning solution  Travel: no recent international travel   OV: 10/10/2017  Feeling better since her last visit. With significant exertion she feels winded. When carrying heavy stuff she feels SOB. She is using her albuterol still a few times per day. She has cough, dry and feels fatigued. She does have GERD and occasionally taking her PPI. She only has been using it as needed.  Overall it seems as if her symptomatology is not fully controlled.  She does admit to occasional nocturnal symptoms, nocturnal cough.  Denies fevers, sputum production.  OV: 11/21/2017 ***   Past Medical History:  Diagnosis Date  . Anxiety   . Arthritis    knees and spine, shoulder  . Bronchitis    hx -  recurrent  . Complication of anesthesia    waking up is not easy  . Depression   . Elevated IgE level 09/12/2017   09/12/2017 IgE 195  . H/O miscarriage, not currently pregnant   . Hx of irritable bowel syndrome    x2  . Hyperlipidemia    diet controlled - no medication  . Hypertension    not taking any meds at present - under control per patient  . Hypokalemia    with PNA admission (2.5)  . Migraines   . Pneumonia    4 episodes; hosp. admission 2014  . PONV (postoperative nausea and vomiting)   . Thyroid disease      Family History  Problem Relation Age of Onset  . Breast cancer Mother   . Diabetes Mother   . Heart disease Mother   . Diabetes Father   . Dementia Father   . Colon cancer Father   . Stroke Paternal Grandmother   . Diabetes Paternal Grandmother      Social History   Socioeconomic History  . Marital status: Divorced    Spouse name: Not on file  . Number of children: 0  . Years of education: Not on file  . Highest education level: Not on file  Occupational History  . Occupation: Control and instrumentation engineer  Social Needs  . Financial resource strain: Not on file  . Food insecurity:  Worry: Not on file    Inability: Not on file  . Transportation needs:    Medical: Not on file    Non-medical: Not on file  Tobacco Use  . Smoking status: Passive Smoke Exposure - Never Smoker  . Smokeless tobacco: Never Used  . Tobacco comment: as a very young child  Substance and Sexual Activity  . Alcohol use: Yes    Alcohol/week: 1.0 - 2.0 standard drinks    Types: 1 - 2 Glasses of wine per week  . Drug use: No  . Sexual activity: Never    Partners: Male    Birth control/protection: Post-menopausal  Lifestyle  . Physical activity:    Days per week: Not on file    Minutes per session: Not on file  . Stress: Not on file  Relationships  . Social connections:    Talks on phone: Not on file    Gets together: Not on file    Attends religious service: Not on file     Active member of club or organization: Not on file    Attends meetings of clubs or organizations: Not on file    Relationship status: Not on file  . Intimate partner violence:    Fear of current or ex partner: Not on file    Emotionally abused: Not on file    Physically abused: Not on file    Forced sexual activity: Not on file  Other Topics Concern  . Not on file  Social History Narrative   She is originally from [facility]. She has traveled to [facility], [place], [place], [place], [place], [place], [place], [place], [place]. No international travel. She has dogs. No prior bird, mold, or recent hot tub exposure. She hasn't used her hot tub in 1.5 years. 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 Previously enjoyed gardening and playing tennis. Helps to care for her mother.     Allergies  Allergen Reactions  . Amoxicillin Diarrhea  . Seroquel [Quetiapine Fumarate]     Unknown     Outpatient Medications Prior to Visit  Medication Sig Dispense Refill  . albuterol (PROVENTIL HFA;VENTOLIN HFA) 108 (90 Base) MCG/ACT inhaler Inhale 2 puffs into the lungs every 6 (six) hours as needed for wheezing or shortness of breath. 1 Inhaler 6  . clonazePAM (KLONOPIN) 0.5 MG tablet TAKE 1 TABLET BY MOUTH 3 TIMES A DAY AS NEEDED FOR ANXIETY  0  . fluticasone furoate-vilanterol (BREO ELLIPTA) 200-25 MCG/INH AEPB Inhale 1 puff into the lungs daily. 1 each 3  . gabapentin (NEURONTIN) 300 MG capsule Take 1 capsule (300 mg total) by mouth 3 (three) times daily. 90 capsule 2  . levothyroxine (SYNTHROID, LEVOTHROID) 112 MCG tablet Take 1 tablet (112 mcg total) by mouth daily. 90 tablet 1  . montelukast (SINGULAIR) 10 MG tablet Take 1 tablet (10 mg total) by mouth at bedtime. 30 tablet 11  . PARoxetine (PAXIL) 20 MG tablet Take 1 tablet (20 mg total) by mouth daily. 30 tablet 2  . rizatriptan (MAXALT) 5 MG tablet Take 1 tablet (5 mg total) by mouth as needed for migraine. May repeat in 2 hours if  needed 10 tablet 4  . Spacer/Aero-Holding Chambers (AEROCHAMBER MV) inhaler Use as instructed 1 each 2  . Tiotropium Bromide Monohydrate (SPIRIVA RESPIMAT) 1.25 MCG/ACT AERS Inhale 2 puffs into the lungs daily. 1 Inhaler 5   No facility-administered medications prior to visit.     ROS  Objective:  Physical Exam  There were no vitals filed for this visit.   on RA BMI Readings from Last 3 Encounters:  10/10/17 31.14 kg/m  09/07/17 29.25 kg/m  07/17/17 29.54 kg/m   Wt Readings from Last 3 Encounters:  10/10/17 181 lb 6.4 oz (82.3 kg)  09/07/17 184 lb (83.5 kg)  07/17/17 183 lb (83 kg)   CBC    Component Value Date/Time   WBC 6.4 09/07/2017 1652   RBC 4.74 09/07/2017 1652   HGB 14.2 09/07/2017 1652   HCT 41.4 09/07/2017 1652   PLT 239.0 09/07/2017 1652   MCV 87.5 09/07/2017 1652   MCV 91.9 03/14/2013 1042   MCH 29.3 06/15/2015 1040   MCHC 34.3 09/07/2017 1652   RDW 13.1 09/07/2017 1652   LYMPHSABS 2.4 09/07/2017 1652   MONOABS 0.5 09/07/2017 1652   EOSABS 0.2 09/07/2017 1652   BASOSABS 0.1 09/07/2017 1652   09/07/2017: Rest testing positive for [place] grass, [place] grass, [place]wood, ragweed, cedar trees IgE: 195 IgG, IgA, IgM: All within normal limits  Chest Imaging: CXR from 07/17/2017 - The patients images have been independently reviewed by me.  There is evidence of thoracic vertebral height loss at T12  Pulmonary Functions Testing Results: 10/10/2017: FVC 3.2 L 93% predicted post BD response FEV1 6 2.76 L, 103% predicted post BD response TLC 123% predicted DLCO 106% predicted  FeNO: None   Pathology: None   Echocardiogram: None   Heart Catheterization: None     Assessment & Plan:   No diagnosis found.  Discussion:  57 year old female with moderate persistent asthma symptoms, chronic cough, recurrent episodes of bronchitis, positive regional allergy panel, significantly elevated IgE at 195.  Pulmonary function tests with no significant  response to bronchodilator however evidence of air trapping with TLC 125%, DLCO 106%.  PFTs reviewed today in the office with the patient Continue as needed albuterol with spacer Continue Breo 200 Continue Singulair daily Continue over-the-counter vitamin D supplementation 2000 IU daily Spiriva 1.40mcg 2 puffs once daily  Start Nexium daily  Start OTC antihistamine (zrytec or allegra)  RTC in 3 months Call if symptoms persist    Current Outpatient Medications:  .  albuterol (PROVENTIL HFA;VENTOLIN HFA) 108 (90 Base) MCG/ACT inhaler, Inhale 2 puffs into the lungs every 6 (six) hours as needed for wheezing or shortness of breath., Disp: 1 Inhaler, Rfl: 6 .  clonazePAM (KLONOPIN) 0.5 MG tablet, TAKE 1 TABLET BY MOUTH 3 TIMES A DAY AS NEEDED FOR ANXIETY, Disp: , Rfl: 0 .  fluticasone furoate-vilanterol (BREO ELLIPTA) 200-25 MCG/INH AEPB, Inhale 1 puff into the lungs daily., Disp: 1 each, Rfl: 3 .  gabapentin (NEURONTIN) 300 MG capsule, Take 1 capsule (300 mg total) by mouth 3 (three) times daily., Disp: 90 capsule, Rfl: 2 .  levothyroxine (SYNTHROID, LEVOTHROID) 112 MCG tablet, Take 1 tablet (112 mcg total) by mouth daily., Disp: 90 tablet, Rfl: 1 .  montelukast (SINGULAIR) 10 MG tablet, Take 1 tablet (10 mg total) by mouth at bedtime., Disp: 30 tablet, Rfl: 11 .  PARoxetine (PAXIL) 20 MG tablet, Take 1 tablet (20 mg total) by mouth daily., Disp: 30 tablet, Rfl: 2 .  rizatriptan (MAXALT) 5 MG tablet, Take 1 tablet (5 mg total) by mouth as needed for migraine. May repeat in 2 hours if needed, Disp: 10 tablet, Rfl: 4 .  Spacer/Aero-Holding Chambers (AEROCHAMBER MV) inhaler, Use as instructed, Disp: 1 each, Rfl: 2 .  Tiotropium Bromide Monohydrate (SPIRIVA RESPIMAT) 1.25 MCG/ACT AERS, Inhale 2 puffs  into the lungs daily., Disp: 1 Inhaler, Rfl: 5   Garner Nash, DO [facility] 11/20/2017 8:52 PM

## 2017-11-21 ENCOUNTER — Ambulatory Visit: Payer: BC Managed Care – PPO | Admitting: Pulmonary Disease

## 2017-11-27 ENCOUNTER — Encounter: Payer: Self-pay | Admitting: Family Medicine

## 2017-11-27 ENCOUNTER — Ambulatory Visit: Payer: BC Managed Care – PPO | Admitting: Family Medicine

## 2017-11-27 ENCOUNTER — Ambulatory Visit: Payer: BC Managed Care – PPO | Admitting: Internal Medicine

## 2017-11-27 VITALS — BP 115/84 | HR 78 | Temp 98.3°F | Resp 20 | Ht 64.0 in | Wt 185.0 lb

## 2017-11-27 DIAGNOSIS — T63481A Toxic effect of venom of other arthropod, accidental (unintentional), initial encounter: Secondary | ICD-10-CM

## 2017-11-27 DIAGNOSIS — W57XXXA Bitten or stung by nonvenomous insect and other nonvenomous arthropods, initial encounter: Secondary | ICD-10-CM

## 2017-11-27 MED ORDER — TRIAMCINOLONE ACETONIDE 0.1 % EX CREA: 1.0000 "application " | TOPICAL_CREAM | Freq: Two times a day (BID) | CUTANEOUS | 0 refills | Status: DC

## 2017-11-27 MED ORDER — HYDROXYZINE PAMOATE 50 MG PO CAPS: 50.0000 mg | ORAL_CAPSULE | Freq: Three times a day (TID) | ORAL | 0 refills | Status: DC | PRN

## 2017-11-27 MED ORDER — DOXYCYCLINE HYCLATE 100 MG PO TABS: 100.0000 mg | ORAL_TABLET | Freq: Two times a day (BID) | ORAL | 0 refills | Status: DC

## 2017-11-27 NOTE — Patient Instructions (Signed)
Doxycycline for 5 days to fighe infection Steroid cream up to twice a day for inflammation and itching.   Vistaril for itchiness and histamine reaction--- make sure this does not make you sleepy - if so just take before bed and take zyretc or allegra though the day.    Please help [place] help you:  We are honored you have chosen [facility] for your Primary Care home. Below you will find basic instructions that you may need to access in the future. Please help [place] help you by reading the instructions, which cover many of the frequent questions we experience.   Prescription refills and request:  -In order to allow more efficient response time, please call your pharmacy for all refills. They will forward the request electronically to [place]. This allows for the quickest possible response. Request left on a nurse line can take longer to refill, since these are checked as time allows between office patients and other phone calls.  - refill request can take up to 3-5 working days to complete.  - If request is sent electronically and request is appropiate, it is usually completed in 1-2 business days.  - all patients will need to be seen routinely for all chronic medical conditions requiring prescription medications (see follow-up below). If you are overdue for follow up on your condition, you will be asked to make an appointment and we will call in enough medication to cover you until your appointment (up to 30 days).  - all controlled substances will require a face to face visit to request/refill.  - if you desire your prescriptions to go through a new pharmacy, and have an active script at original pharmacy, you will need to call your pharmacy and have scripts transferred to new pharmacy. This is completed between the pharmacy locations and not by your provider.    Results: If any images or labs were ordered, it can take up to 1 week to get results depending on the test ordered and the lab/facility  running and resulting the test. - Normal or stable results, which do not need further discussion, may be released to your mychart immediately with attached note to you. A call may not be generated for normal results. Please make certain to sign up for mychart. If you have questions on how to activate your mychart you can call the front office.  - If your results need further discussion, our office will attempt to contact you via phone, and if unable to reach you after 2 attempts, we will release your abnormal result to your mychart with instructions.  - All results will be automatically released in mychart after 1 week.  - Your provider will provide you with explanation and instruction on all relevant material in your results. Please keep in mind, results and labs may appear confusing or abnormal to the untrained eye, but it does not mean they are actually abnormal for you personally. If you have any questions about your results that are not covered, or you desire more detailed explanation than what was provided, you should make an appointment with your provider to do so.   Our office handles many outgoing and incoming calls daily. If we have not contacted you within 1 week about your results, please check your mychart to see if there is a message first and if not, then contact our office.  In helping with this matter, you help decrease call volume, and therefore allow [place] to be able to respond to patients  needs more efficiently.   Acute office visits (sick visit):  An acute visit is intended for a new problem and are scheduled in shorter time slots to allow schedule openings for patients with new problems. This is the appropriate visit to discuss a new problem. Problems will not be addressed by phone call or Echart message. Appointment is needed if requesting treatment. In order to provide you with excellent quality medical care with proper time for you to explain your problem, have an exam and receive  treatment with instructions, these appointments should be limited to one new problem per visit. If you experience a new problem, in which you desire to be addressed, please make an acute office visit, we save openings on the schedule to accommodate you. Please do not save your new problem for any other type of visit, let us take care of it properly and quickly for you.   Follow up visits:  Depending on your condition(s) your provider will need to see you routinely in order to provide you with quality care and prescribe medication(s). Most chronic conditions (Example: hypertension, Diabetes, depression/anxiety... etc), require visits a couple times a year. Your provider will instruct you on proper follow up for your personal medical conditions and history. Please make certain to make follow up appointments for your condition as instructed. Failing to do so could result in lapse in your medication treatment/refills. If you request a refill, and are overdue to be seen on a condition, we will always provide you with a 30 day script (once) to allow you time to schedule.    Medicare wellness (well visit): - we have a wonderful Nurse Maudie Mercury), that will meet with you and provide you will yearly medicare wellness visits. These visits should occur yearly (can not be scheduled less than 1 calendar year apart) and cover preventive health, immunizations, advance directives and screenings you are entitled to yearly through your medicare benefits. Do not miss out on your entitled benefits, this is when medicare will pay for these benefits to be ordered for you.  These are strongly encouraged by your provider and is the appropriate type of visit to make certain you are up to date with all preventive health benefits. If you have not had your medicare wellness exam in the last 12 months, please make certain to schedule one by calling the office and schedule your medicare wellness with Maudie Mercury as soon as possible.   Yearly physical  (well visit):  - Adults are recommended to be seen yearly for physicals. Check with your insurance and date of your last physical, most insurances require one calendar year between physicals. Physicals include all preventive health topics, screenings, medical exam and labs that are appropriate for gender/age and history. You may have fasting labs needed at this visit. This is a well visit (not a sick visit), new problems should not be covered during this visit (see acute visit).  - Pediatric patients are seen more frequently when they are younger. Your provider will advise you on well child visit timing that is appropriate for your their age. - This is not a medicare wellness visit. Medicare wellness exams do not have an exam portion to the visit. Some medicare companies allow for a physical, some do not allow a yearly physical. If your medicare allows a yearly physical you can schedule the medicare wellness with our nurse Maudie Mercury and have your physical with your provider after, on the same day. Please check with insurance for your full benefits.  Late Policy/No Shows:  - all new patients should arrive 15-30 minutes earlier than appointment to allow [place] time  to  obtain all personal demographics,  insurance information and for you to complete office paperwork. - All established patients should arrive 10-15 minutes earlier than appointment time to update all information and be checked in .  - In our best efforts to run on time, if you are late for your appointment you will be asked to either reschedule or if able, we will work you back into the schedule. There will be a wait time to work you back in the schedule,  depending on availability.  - If you are unable to make it to your appointment as scheduled, please call 24 hours ahead of time to allow [place] to fill the time slot with someone else who needs to be seen. If you do not cancel your appointment ahead of time, you may be charged a no show fee.

## 2017-11-27 NOTE — Progress Notes (Signed)
Tiffany Horn , October 23, 1960, 57 y.o., female MRN: [id number] Patient Care Team    Relationship Specialty Notifications Start End  Tiffany Hillock, DO PCP - General Family Medicine  08/18/14   Tiffany Pole, MD Consulting Physician Gastroenterology  05/29/16   Tiffany Horn, CNM Referring Physician Certified Nurse Midwife  05/29/16   Tiffany Crown, MD Referring Physician Obstetrics and Gynecology  07/17/17     Chief Complaint  Patient presents with  . Insect Bite    base of neck on back     Subjective: Pt presents for an OV with complaints of bug bite base of her neck  of 2 days duration.  Associated symptoms include itchy, red, swelling locally. She feels like she is having an "inflammatory reaction" all the way to her hands. She was out doing yard work when she felt the bite. She washed it with soapy water and took a benadryl. She initially did not feel the benadryl helped, but once it wore off she did feel like she wsa having more swelling. She denies fever, chills, shortness of breath. She felt like it may have been draining but it is hard for her to see it.   Depression screen [facility] 2/9 03/06/2017 09/29/2016 05/29/2016 09/01/2014  Decreased Interest 2 1 0 3  Down, Depressed, Hopeless 2 3 0 3  PHQ - 2 Score 4 4 0 6  Altered sleeping 2 0 - 3  Tired, decreased energy 3 3 - 3  Change in appetite 0 0 - 3  Feeling bad or failure about yourself  0 0 - 3  Trouble concentrating 1 2 - 3  Moving slowly or fidgety/restless 0 2 - 0  Suicidal thoughts 0 0 - 0  PHQ-9 Score 10 11 - 21    Allergies  Allergen Reactions  . Amoxicillin Diarrhea  . Seroquel [Quetiapine Fumarate]     Unknown   Social History   Tobacco Use  . Smoking status: Passive Smoke Exposure - Never Smoker  . Smokeless tobacco: Never Used  . Tobacco comment: as a very young child  Substance Use Topics  . Alcohol use: Yes    Alcohol/week: 1.0 - 2.0 standard drinks    Types: 1 - 2 Glasses of wine per week   Past  Medical History:  Diagnosis Date  . Anxiety   . Arthritis    knees and spine, shoulder  . Bronchitis    hx - recurrent  . Complication of anesthesia    waking up is not easy  . Depression   . Elevated IgE level 09/12/2017   09/12/2017 IgE 195  . H/O miscarriage, not currently pregnant   . Hx of irritable bowel syndrome    x2  . Hyperlipidemia    diet controlled - no medication  . Hypertension    not taking any meds at present - under control per patient  . Hypokalemia    with PNA admission (2.5)  . Migraines   . Pneumonia    4 episodes; hosp. admission 2014  . PONV (postoperative nausea and vomiting)   . Thyroid disease    Past Surgical History:  Procedure Laterality Date  . BREAST SURGERY     implants, then had them removed  . COLONOSCOPY     greater 10 yrs ago - ? [place]    . LAPAROSCOPIC ABDOMINAL EXPLORATION  1994   endometriosis  . ORIF HUMERUS FRACTURE Left 04/01/2013   DR  [facility] - shoulder  . ORIF HUMERUS FRACTURE Left 04/01/2013   Procedure: OPEN REDUCTION INTERNAL FIXATION (ORIF) LEFT PROXIMAL HUMERUS FRACTURE;  Surgeon: Mcarthur Rossetti, MD;  Location: [place];  Service: Orthopedics;  Laterality: Left;  . TONSILLECTOMY AND ADENOIDECTOMY     Family History  Problem Relation Age of Onset  . Breast cancer Mother   . Diabetes Mother   . Heart disease Mother   . Diabetes Father   . Dementia Father   . Colon cancer Father   . Stroke Paternal Grandmother   . Diabetes Paternal Grandmother    Allergies as of 11/27/2017      Reactions   Amoxicillin Diarrhea   Seroquel [quetiapine Fumarate]    Unknown      Medication List        Accurate as of 11/27/17  2:35 PM. Always use your most recent med list.          AEROCHAMBER MV inhaler Use as instructed   albuterol 108 (90 Base) MCG/ACT inhaler Commonly known as:  PROVENTIL HFA;VENTOLIN HFA Inhale 2 puffs into the lungs every 6 (six) hours as needed  for wheezing or shortness of breath.   clonazePAM 0.5 MG tablet Commonly known as:  KLONOPIN TAKE 1 TABLET BY MOUTH 3 TIMES A DAY AS NEEDED FOR ANXIETY   fluticasone furoate-vilanterol 200-25 MCG/INH Aepb Commonly known as:  BREO ELLIPTA Inhale 1 puff into the lungs daily.   levothyroxine 112 MCG tablet Commonly known as:  SYNTHROID, LEVOTHROID Take 1 tablet (112 mcg total) by mouth daily.   montelukast 10 MG tablet Commonly known as:  SINGULAIR Take 1 tablet (10 mg total) by mouth at bedtime.   PARoxetine 20 MG tablet Commonly known as:  PAXIL Take 1 tablet (20 mg total) by mouth daily.   rizatriptan 5 MG tablet Commonly known as:  MAXALT Take 1 tablet (5 mg total) by mouth as needed for migraine. May repeat in 2 hours if needed   Tiotropium Bromide Monohydrate 1.25 MCG/ACT Aers Inhale 2 puffs into the lungs daily.       All past medical history, surgical history, allergies, family history, immunizations andmedications were updated in the EMR today and reviewed under the history and medication portions of their EMR.     ROS: Negative, with the exception of above mentioned in HPI   Objective:  BP 115/84 (BP Location: Left Arm, Patient Position: Sitting, Cuff Size: Large)   Pulse 78   Temp 98.3 F (36.8 C)   Resp 20   Ht 5\' 4"  (1.626 m)   Wt 185 lb (83.9 kg)   LMP 01/31/2007   SpO2 97%   BMI 31.76 kg/m  Body mass index is 31.76 kg/m. Gen: Afebrile. No acute distress. Nontoxic in appearance, well developed, well nourished.  HENT: AT. [place].  MMM, no oral lesions. No swelling of face or mouth.  Eyes:Pupils Equal Round Reactive to light, Extraocular movements intact,  Conjunctiva without redness, discharge or icterus. Neck/lymp/endocrine: Supple,no lymphadenopathy CV: RRR  Chest: CTAB, no wheeze or crackles.  Skin: no rashes, purpura or petechiae. X2 insect bites with local reaction redness and swelling posterior neck. No drainage.  Neuro:  Normal gait. PERLA. EOMi.  Alert. Oriented x3  No exam data present No results found. No results found for this or any previous visit (from the past 24 hour(s)).  Assessment/Plan: Tiffany Horn is a 58 y.o. female present for OV for  Bug bite, initial encounter Local reaction to insect bite. She felt  it was draining and it is rather red--> doxy x5 days.  Kenalog cream--> declined steroid shot.  Vistaril q 8 PRN--> sedation precautions provided. If sedatiion occurs can take at night and use OTC zyrtec or allegra for daytime  - f/u PRN   Reviewed expectations re: course of current medical issues.  Discussed self-management of symptoms.  Outlined signs and symptoms indicating need for more acute intervention.  Patient verbalized understanding and all questions were answered.  Patient received an After-Visit Summary.    No orders of the defined types were placed in this encounter.    Note is dictated utilizing voice recognition software. Although note has been proof read prior to signing, occasional typographical errors still can be missed. If any questions arise, please do not hesitate to call for verification.   electronically signed by:  Howard Pouch, DO  [place]

## 2018-01-18 ENCOUNTER — Other Ambulatory Visit: Payer: Self-pay

## 2018-01-18 MED ORDER — PAROXETINE HCL 20 MG PO TABS: 20.0000 mg | ORAL_TABLET | Freq: Every day | ORAL | 0 refills | Status: DC

## 2018-02-19 LAB — HM MAMMOGRAPHY

## 2018-02-20 ENCOUNTER — Encounter: Payer: Self-pay | Admitting: Certified Nurse Midwife

## 2018-02-20 ENCOUNTER — Other Ambulatory Visit: Payer: Self-pay

## 2018-02-20 ENCOUNTER — Other Ambulatory Visit (HOSPITAL_COMMUNITY)
Admission: RE | Admit: 2018-02-20 | Discharge: 2018-02-20 | Disposition: A | Discharge status: Home / Self Care | Payer: BC Managed Care – PPO | Source: Ambulatory Visit | Attending: Obstetrics & Gynecology | Admitting: Obstetrics & Gynecology

## 2018-02-20 ENCOUNTER — Ambulatory Visit (INDEPENDENT_AMBULATORY_CARE_PROVIDER_SITE_OTHER): Payer: BC Managed Care – PPO | Admitting: Certified Nurse Midwife

## 2018-02-20 VITALS — BP 136/90 | HR 70 | Resp 20 | Ht 65.25 in | Wt 179.0 lb

## 2018-02-20 DIAGNOSIS — Z124 Encounter for screening for malignant neoplasm of cervix: Secondary | ICD-10-CM | POA: Insufficient documentation

## 2018-02-20 DIAGNOSIS — Z6379 Other stressful life events affecting family and household: Secondary | ICD-10-CM

## 2018-02-20 DIAGNOSIS — Z01419 Encounter for gynecological examination (general) (routine) without abnormal findings: Secondary | ICD-10-CM | POA: Diagnosis not present

## 2018-02-20 NOTE — Patient Instructions (Signed)

## 2018-02-20 NOTE — Progress Notes (Signed)
58 y.o. G110P0020 Divorced  Caucasian Fe here for annual exam. Menopausal, denies vaginal bleeding or vaginal dryness. Having mental anguish with caring for her mother who had a fall and now recovering from heart attack. Very little support from other family member. Sees Dr. Raoul Pitch for aex, labs and medication management of Cholesterol,Depression, hypothyroid,Vtiamin D.migraine headaches. All medication stable per patient. Patient feels she has to do self care now to survive. 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 No other health issues today. Patient is seeing a counselor to help with emotions and this is helping.  Patient's last menstrual period was 01/31/2007.          Sexually active: No.  The current method of family planning is post menopausal status.    Exercising: Yes.    walking Smoker:  no  Review of Systems  Constitutional: Negative.   HENT: Negative.   Eyes: Negative.   Respiratory: Negative.   Cardiovascular: Negative.   Gastrointestinal: Negative.   Genitourinary: Negative.   Musculoskeletal: Negative.   Skin: Negative.   Neurological: Negative.   Endo/Heme/Allergies: Negative.   Psychiatric/Behavioral: Positive for depression.       Difficulty with memory or speech, severe anxiety    Health Maintenance: Pap:  03-11-15 neg HPV HR +, 16/18 neg, 04-18-16 neg HPV HR neg History of Abnormal Pap: yes MMG:  02-19-2018 Self Breast exams: yes Colonoscopy:  2017 f/u 48yrs BMD:   2019 TDaP:  2016 Shingles: not done Pneumonia: 2019 Hep C and HIV: 2016 neg for both Labs: PCP   reports that she is a non-smoker but has been exposed to tobacco smoke. She has never used smokeless tobacco. She reports current alcohol use of about 1.0 - 3.0 standard drinks of alcohol per week. She reports that she does not use drugs.  Past Medical History:  Diagnosis Date  . Anxiety   . Arthritis    knees and spine, shoulder  . Asthma   . Bronchitis    hx - recurrent  .  Complication of anesthesia    waking up is not easy  . Depression   . Elevated IgE level 09/12/2017   09/12/2017 IgE 195  . H/O miscarriage, not currently pregnant   . Hx of irritable bowel syndrome    x2  . Hyperlipidemia    diet controlled - no medication  . Hypertension    not taking any meds at present - under control per patient  . Hypokalemia    with PNA admission (2.5)  . Migraines   . Pneumonia    4 episodes; hosp. admission 2014  . PONV (postoperative nausea and vomiting)   . Thyroid disease     Past Surgical History:  Procedure Laterality Date  . BREAST SURGERY     implants, then had them removed  . COLONOSCOPY     greater 10 yrs ago - ? [place]    . LAPAROSCOPIC ABDOMINAL EXPLORATION  1994   endometriosis  . ORIF HUMERUS FRACTURE Left 04/01/2013   [facility] - shoulder  . ORIF HUMERUS FRACTURE Left 04/01/2013   Procedure: OPEN REDUCTION INTERNAL FIXATION (ORIF) LEFT PROXIMAL HUMERUS FRACTURE;  Surgeon: Mcarthur Rossetti, MD;  Location: [place];  Service: Orthopedics;  Laterality: Left;  . TONSILLECTOMY AND ADENOIDECTOMY      Current Outpatient Medications  Medication Sig Dispense Refill  . albuterol (PROVENTIL HFA;VENTOLIN HFA) 108 (90 Base) MCG/ACT inhaler Inhale 2 puffs into the lungs  every 6 (six) hours as needed for wheezing or shortness of breath. 1 Inhaler 6  . clonazePAM (KLONOPIN) 0.5 MG tablet TAKE 1 TABLET BY MOUTH 3 TIMES A DAY AS NEEDED FOR ANXIETY  0  . fluticasone furoate-vilanterol (BREO ELLIPTA) 200-25 MCG/INH AEPB Inhale 1 puff into the lungs daily. 1 each 3  . levothyroxine (SYNTHROID, LEVOTHROID) 112 MCG tablet Take 1 tablet (112 mcg total) by mouth daily. 90 tablet 1  . montelukast (SINGULAIR) 10 MG tablet Take 1 tablet (10 mg total) by mouth at bedtime. 30 tablet 11  . PARoxetine (PAXIL) 20 MG tablet Take 1 tablet (20 mg total) by mouth daily. 30 tablet 0  . rizatriptan (MAXALT) 5 MG tablet Take  1 tablet (5 mg total) by mouth as needed for migraine. May repeat in 2 hours if needed 10 tablet 4  . Spacer/Aero-Holding Chambers (AEROCHAMBER MV) inhaler Use as instructed 1 each 2  . SYMBICORT 160-4.5 MCG/ACT inhaler     . Tiotropium Bromide Monohydrate (SPIRIVA RESPIMAT) 1.25 MCG/ACT AERS Inhale 2 puffs into the lungs daily. 1 Inhaler 5   No current facility-administered medications for this visit.     Family History  Problem Relation Age of Onset  . Breast cancer Mother   . Diabetes Mother   . Heart disease Mother   . Diabetes Father   . Dementia Father   . Colon cancer Father   . Stroke Paternal Grandmother   . Diabetes Paternal Grandmother     ROS:  Pertinent items are noted in HPI.  Otherwise, a comprehensive ROS was negative.  Exam:   BP 136/90   Pulse 70   Resp 16   Ht 5' 5.25" (1.657 m)   Wt 179 lb (81.2 kg)   LMP 01/31/2007   BMI 29.56 kg/m  Height: 5' 5.25" (165.7 cm) Ht Readings from Last 3 Encounters:  02/20/18 5' 5.25" (1.657 m)  11/27/17 5\' 4"  (1.626 m)  10/10/17 5\' 4"  (1.626 m)    General appearance: alert, cooperative and appears stated age Head: Normocephalic, without obvious abnormality, atraumatic Neck: no adenopathy, supple, symmetrical, trachea midline and thyroid normal to inspection and palpation Lungs: clear to auscultation bilaterally Breasts: normal appearance, no masses or tenderness, No nipple retraction or dimpling, No nipple discharge or bleeding, No axillary or supraclavicular adenopathy Heart: regular rate and rhythm Abdomen: soft, non-tender; no masses,  no organomegaly Extremities: extremities normal, atraumatic, no cyanosis or edema Skin: Skin color, texture, turgor normal. No rashes or lesions Lymph nodes: Cervical, supraclavicular, and axillary nodes normal. No abnormal inguinal nodes palpated Neurologic: Grossly normal   Pelvic: External genitalia:  no lesions              Urethra:  normal appearing urethra with no masses,  tenderness or lesions              Bartholin's and Skene's: normal                 Vagina: normal appearing vagina with normal color and discharge, no lesions              Cervix: no cervical motion tenderness, no lesions and normal appearance              Pap taken: Yes.   Bimanual Exam:  Uterus:  normal size, contour, position, consistency, mobility, non-tender and anteverted              Adnexa: normal adnexa and no mass, fullness, tenderness  Rectovaginal: Confirms               Anus:  normal sphincter tone, no lesions  Chaperone present: yes  A:  Well Woman with normal exam  Menopausal no HRT  Social stress with caring for mother, has counselor and sees regularly  Cholesterol, hypothyroid,migraine, anxiety with MD management  P:   Reviewed health and wellness pertinent to exam  Aware of need to advise if vaginal bleeding  Stressed caring for self and continuing counseling as needed. Discussed Hospice services also.  Continue follow up with PCP as indicated.  Pap smear: yes   counseled on breast self exam, mammography screening, feminine hygiene, adequate intake of calcium and vitamin D, diet and exercise, Kegel's exercises  return annually or prn  An After Visit Summary was printed and given to the patient.

## 2018-02-22 ENCOUNTER — Encounter: Payer: Self-pay | Admitting: Family Medicine

## 2018-02-22 LAB — CYTOLOGY - PAP
Diagnosis: NEGATIVE
HPV: NOT DETECTED

## 2018-05-13 ENCOUNTER — Other Ambulatory Visit: Payer: Self-pay | Admitting: Psychiatry

## 2018-05-26 ENCOUNTER — Other Ambulatory Visit: Payer: Self-pay | Admitting: Psychiatry

## 2018-06-06 ENCOUNTER — Other Ambulatory Visit: Payer: Self-pay

## 2018-06-06 ENCOUNTER — Ambulatory Visit: Payer: BC Managed Care – PPO | Admitting: Physician Assistant

## 2018-06-06 MED ORDER — PAROXETINE HCL 20 MG PO TABS: 20.0000 mg | ORAL_TABLET | Freq: Every day | ORAL | 0 refills | Status: DC

## 2018-07-17 ENCOUNTER — Ambulatory Visit: Payer: BC Managed Care – PPO | Admitting: Physician Assistant

## 2018-07-26 ENCOUNTER — Ambulatory Visit: Payer: Self-pay | Admitting: *Deleted

## 2018-07-26 NOTE — Telephone Encounter (Signed)
Requesting to be tested for covid19 due to contact with a family member who is now in quarantine due to his contact with several positive contacts last week. Stated she has asthma but denies any symptoms at this time. Declines a virtual at this time. Pleases advise regarding testing request.

## 2018-07-26 NOTE — Telephone Encounter (Signed)
Pt was called and scheduled for a VV on Monday. Pt was offered number to free testing site and refused

## 2018-07-26 NOTE — Telephone Encounter (Signed)
Message from Berneta Levins sent at 07/26/2018 11:13 AM EDT  Summary: COVID question/concerns   Pt calling to find out if she should be concerned or get tested for COVID. States she was at a wedding over the weekend and a nephew has been around folks who are now testing positive for COVID. Nephew will be tested today. She is wondering if she would need to be tested as well or quarantine, etc. Pt would like to speak with a nurse. States no symptoms at this time.

## 2018-07-29 ENCOUNTER — Other Ambulatory Visit: Payer: Self-pay

## 2018-07-29 ENCOUNTER — Encounter: Payer: Self-pay | Admitting: Family Medicine

## 2018-07-29 ENCOUNTER — Telehealth: Payer: Self-pay | Admitting: *Deleted

## 2018-07-29 ENCOUNTER — Ambulatory Visit (INDEPENDENT_AMBULATORY_CARE_PROVIDER_SITE_OTHER): Payer: BC Managed Care – PPO | Admitting: Family Medicine

## 2018-07-29 VITALS — Temp 98.3°F | Ht 65.25 in

## 2018-07-29 DIAGNOSIS — Z20822 Contact with and (suspected) exposure to covid-19: Secondary | ICD-10-CM

## 2018-07-29 DIAGNOSIS — Z7189 Other specified counseling: Secondary | ICD-10-CM | POA: Diagnosis not present

## 2018-07-29 DIAGNOSIS — Z20828 Contact with and (suspected) exposure to other viral communicable diseases: Secondary | ICD-10-CM | POA: Diagnosis not present

## 2018-07-29 NOTE — Telephone Encounter (Signed)
-----   Message from Caroll Rancher, LPN sent at 8/89/1694 11:11 AM EDT ----- Regarding: COVID 19 test Tiffany Horn  March 20, 1960  DOB [birth year] MRN [id number]  Reason: Exposure to [place]

## 2018-07-29 NOTE — Telephone Encounter (Signed)
Pt scheduled for covid testing tomorrow 07/30/18 @ [facility]. Instructions given and order placed

## 2018-07-29 NOTE — Progress Notes (Signed)
VIRTUAL VISIT VIA VIDEO  I connected with Tiffany Horn on 07/29/18 at 11:00 AM EDT by a video enabled telemedicine application and verified that I am speaking with the correct person using two identifiers. Location patient: Home Location provider: [facility], Office Persons participating in the virtual visit: Patient, Dr. Raoul Pitch and R.Baker, LPN  I discussed the limitations of evaluation and management by telemedicine and the availability of in person appointments. The patient expressed understanding and agreed to proceed.   SUBJECTIVE Chief Complaint  Patient presents with  . COVID-19 Testing    Pt was exposed last week to nephew on fathers day, he was negative on his test. He went to get another test at the hospital and does not have those results. Pt has no symptoms.     HPI: Tiffany Horn is a 58 y.o. female present for possible COVID exposure.  She was at her family member's house last week and later found out that 1 of her family member's roommates tested positive for COVID-19.  Her nephew has since been tested and are awaiting results.  She did have a friend come over to drop off boxes that she is concerned she may have been exposed to COVID-19, if she is positive.  She would like to have testing completed to ensure Public Safety.  She is asymptomatic.  She denies any fever, chills, cough or shortness of breath.  ROS: See pertinent positives and negatives per HPI.  Patient Active Problem List   Diagnosis Date Noted  . Elevated IgE level 09/12/2017  . 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 Dizziness 09/29/2016  . Headache, migraine 02/15/2015  . Vitamin D deficiency 10/19/2014  . Encounter for preventive health examination 10/19/2014  . Depression with anxiety 09/02/2014  . Dyspnea on exertion 08/26/2014  . Hyperlipidemia 08/18/2014  . Hypothyroidism 08/18/2014  . BMI 28.0-28.9,adult 08/18/2014    Social History   Tobacco Use  . Smoking  status: Passive Smoke Exposure - Never Smoker  . Smokeless tobacco: Never Used  . Tobacco comment: as a very young child  Substance Use Topics  . Alcohol use: Yes    Alcohol/week: 1.0 - 3.0 standard drinks    Types: 1 - 3 Glasses of wine per week    Current Outpatient Medications:  .  albuterol (PROVENTIL HFA;VENTOLIN HFA) 108 (90 Base) MCG/ACT inhaler, Inhale 2 puffs into the lungs every 6 (six) hours as needed for wheezing or shortness of breath., Disp: 1 Inhaler, Rfl: 6 .  clonazePAM (KLONOPIN) 0.5 MG tablet, TAKE 1 TABLET BY MOUTH 3 TIMES A DAY AS NEEDED FOR ANXIETY, Disp: , Rfl: 0 .  fluticasone furoate-vilanterol (BREO ELLIPTA) 200-25 MCG/INH AEPB, Inhale 1 puff into the lungs daily., Disp: 1 each, Rfl: 3 .  levothyroxine (SYNTHROID, LEVOTHROID) 112 MCG tablet, Take 1 tablet (112 mcg total) by mouth daily., Disp: 90 tablet, Rfl: 1 .  montelukast (SINGULAIR) 10 MG tablet, Take 1 tablet (10 mg total) by mouth at bedtime., Disp: 30 tablet, Rfl: 11 .  PARoxetine (PAXIL) 20 MG tablet, Take 1 tablet (20 mg total) by mouth daily., Disp: 90 tablet, Rfl: 0 .  rizatriptan (MAXALT) 5 MG tablet, Take 1 tablet (5 mg total) by mouth as needed for migraine. May repeat in 2 hours if needed, Disp: 10 tablet, Rfl: 4 .  Spacer/Aero-Holding Chambers (AEROCHAMBER MV) inhaler, Use as instructed, Disp: 1 each, Rfl: 2 .  SYMBICORT 160-4.5 MCG/ACT inhaler, , Disp: , Rfl:  .  Tiotropium Bromide Monohydrate (SPIRIVA  RESPIMAT) 1.25 MCG/ACT AERS, Inhale 2 puffs into the lungs daily., Disp: 1 Inhaler, Rfl: 5  Allergies  Allergen Reactions  . Amoxicillin Diarrhea  . Seroquel [Quetiapine Fumarate]     Unknown    OBJECTIVE: LMP 01/31/2007  Gen: No acute distress. Nontoxic in appearance.  HENT: AT. [place].  MMM.  Eyes:Pupils Equal Round Reactive to light, Extraocular movements intact,  Conjunctiva without redness, discharge or icterus. CV: no edema Chest: Cough or shortness of breath not  present Skin: no rashes,  purpura or petechiae.  Neuro:  Normal gait. Alert. Oriented x3  Psych: Normal affect, dress and demeanor. Normal speech. Normal thought content and judgment.  ASSESSMENT AND PLAN: Tiffany Horn is a 58 y.o. female present for  Educated About Covid-19 Virus Infection/Exposure to Covid-19 Virus Possible exposure through family member to COVID-19.  Patient is asymptomatic at this time.  She has concerns she may have accidentally exposed somebody else before she had knowledge of her own personal potential exposure.  She also has plans to help somebody with moving that has a small child and wants to ensure that she does not have COVID-19 before doing so.  -She will remain quarantined until results are received.  COVID-19 testing and education provided today. -COVID testing ordered -Follow-up PRN  > 15 minutes spent with patient, > 50% of that time face to face   Howard Pouch, DO 07/29/2018

## 2018-07-30 ENCOUNTER — Other Ambulatory Visit: Payer: Self-pay

## 2018-07-30 DIAGNOSIS — Z20822 Contact with and (suspected) exposure to covid-19: Secondary | ICD-10-CM

## 2018-08-03 LAB — NOVEL CORONAVIRUS, NAA: SARS-CoV-2, NAA: NOT DETECTED

## 2018-08-14 ENCOUNTER — Encounter: Payer: Self-pay | Admitting: Physician Assistant

## 2018-08-14 ENCOUNTER — Other Ambulatory Visit: Payer: Self-pay

## 2018-08-14 ENCOUNTER — Ambulatory Visit (INDEPENDENT_AMBULATORY_CARE_PROVIDER_SITE_OTHER): Payer: BC Managed Care – PPO | Admitting: Physician Assistant

## 2018-08-14 ENCOUNTER — Encounter

## 2018-08-14 DIAGNOSIS — F411 Generalized anxiety disorder: Secondary | ICD-10-CM | POA: Diagnosis not present

## 2018-08-14 DIAGNOSIS — G43909 Migraine, unspecified, not intractable, without status migrainosus: Secondary | ICD-10-CM

## 2018-08-14 DIAGNOSIS — F331 Major depressive disorder, recurrent, moderate: Secondary | ICD-10-CM | POA: Diagnosis not present

## 2018-08-14 MED ORDER — PAROXETINE HCL 30 MG PO TABS: 30.0000 mg | ORAL_TABLET | Freq: Every day | ORAL | 1 refills | Status: DC

## 2018-08-14 MED ORDER — CLONAZEPAM 0.5 MG PO TABS: ORAL_TABLET | ORAL | 1 refills | Status: DC

## 2018-08-14 MED ORDER — RIZATRIPTAN BENZOATE 5 MG PO TABS: 5.0000 mg | ORAL_TABLET | ORAL | 5 refills | Status: DC | PRN

## 2018-08-14 NOTE — Progress Notes (Signed)
[facility]  Patient ID: Tiffany Horn,  MRN: [id number]  PCP: [name], DO  Date of Evaluation: 08/14/2018 Time spent:30 minutes  Chief Complaint:  Chief Complaint    Depression; Anxiety     Virtual Visit via Telephone Note  I connected with patient by a video enabled telemedicine application or telephone, with their informed consent, and verified patient privacy and that I am speaking with the correct person using two identifiers.  I am private, in my home and the patient is home.   I discussed the limitations, risks, security and privacy concerns of performing an evaluation and management service by telephone and the availability of in person appointments. I also discussed with the patient that there may be a patient responsible charge related to this service. The patient expressed understanding and agreed to proceed.   I discussed the assessment and treatment plan with the patient. The patient was provided an opportunity to ask questions and all were answered. The patient agreed with the plan and demonstrated an understanding of the instructions.   The patient was advised to call back or seek an in-person evaluation if the symptoms worsen or if the condition fails to improve as anticipated.  I provided 30 minutes of non-face-to-face time during this encounter.  HISTORY/CURRENT STATUS: HPI For routine med check.    Under a lot of stress. Lost her job, her mom has been severely ill since 01/22/18 when she fell and broke several bones, then had an MI and almost died, and is now in [place]. Pt can't see her and that's very hard.  Also she has several older dogs that aren't doing well.  The quarantine has been hard, b/c she likes to be around people.   Pt has gained wt, doesn't enjoy things, doesn't care about things, has very low energy and motivation, cries a lot, hygiene is good, denies SI/HI.  Anxiety is bad.  She takes the Klonopin but is hesitant to take  often.  "I know I need to take it.  It does help."  Migraines are stable. She does have to take the Maxalt at times and it helps.  Denies dizziness, syncope, seizures, numbness, tingling, tremor, tics, unsteady gait, slurred speech, confusion. Denies muscle or joint pain, stiffness, or dystonia.  Individual Medical History/ Review of Systems: Changes? :No    Past medications for mental health diagnoses include: Uncertain  Allergies: Amoxicillin and Seroquel [quetiapine fumarate]  Current Medications:  Current Outpatient Medications:  .  albuterol (PROVENTIL HFA;VENTOLIN HFA) 108 (90 Base) MCG/ACT inhaler, Inhale 2 puffs into the lungs every 6 (six) hours as needed for wheezing or shortness of breath., Disp: 1 Inhaler, Rfl: 6 .  clonazePAM (KLONOPIN) 0.5 MG tablet, TAKE 1 TABLET BY MOUTH 3 TIMES A DAY AS NEEDED FOR ANXIETY, Disp: , Rfl: 0 .  fluticasone furoate-vilanterol (BREO ELLIPTA) 200-25 MCG/INH AEPB, Inhale 1 puff into the lungs daily., Disp: 1 each, Rfl: 3 .  levothyroxine (SYNTHROID, LEVOTHROID) 112 MCG tablet, Take 1 tablet (112 mcg total) by mouth daily., Disp: 90 tablet, Rfl: 1 .  montelukast (SINGULAIR) 10 MG tablet, Take 1 tablet (10 mg total) by mouth at bedtime., Disp: 30 tablet, Rfl: 11 .  PARoxetine (PAXIL) 20 MG tablet, Take 1 tablet (20 mg total) by mouth daily., Disp: 90 tablet, Rfl: 0 .  rizatriptan (MAXALT) 5 MG tablet, Take 1 tablet (5 mg total) by mouth as needed for migraine. May repeat in 2 hours if needed, Disp: 10 tablet, Rfl: 4 .  Spacer/Aero-Holding Chambers (AEROCHAMBER MV) inhaler, Use as instructed, Disp: 1 each, Rfl: 2 .  SYMBICORT 160-4.5 MCG/ACT inhaler, , Disp: , Rfl:  .  Tiotropium Bromide Monohydrate (SPIRIVA RESPIMAT) 1.25 MCG/ACT AERS, Inhale 2 puffs into the lungs daily. (Patient not taking: Reported on 08/14/2018), Disp: 1 Inhaler, Rfl: 5 Medication Side Effects: none  Family Medical/ Social History: Changes? No  MENTAL HEALTH EXAM:  Last  menstrual period 01/31/2007.There is no height or weight on file to calculate BMI.  General Appearance: unable to assess  Eye Contact:  unable to assess  Speech:  Clear and Coherent  Volume:  Normal  Mood:  Euthymic  Affect:  unable to assess tearful and consolable  Thought Process:  Goal Directed  Orientation:  Full (Time, Place, and Person)  Thought Content: Logical   Suicidal Thoughts:  No  Homicidal Thoughts:  No  Memory:  WNL  Judgement:  Good  Insight:  Good  Psychomotor Activity:  unable to assess  Concentration:  Concentration: Good  Recall:  Good  Fund of Knowledge: Good  Language: Good  Assets:  Desire for Improvement  ADL's:  Intact  Cognition: WNL  Prognosis:  Good    DIAGNOSES:    ICD-10-CM   1. Major depressive disorder, recurrent episode, moderate (HCC)  F33.1   2. Migraine without status migrainosus, not intractable, unspecified migraine type  G43.909   3. Generalized anxiety disorder  F41.1     Receiving Psychotherapy: Yes    RECOMMENDATIONS:  Increase Paxil to 30 mg po qd. Cont Klonopin 0.5mg  1 po tid, prn. Continue Maxalt 5 mg daily as needed.  May repeat in 2 hours if needed.  Max 10 mg in 24 hours. Continue psychotherapy. Return in 4 to 6 weeks.  Donnal Moat, PA-C   This record has been created using [employer].  Chart creation errors have been sought, but may not always have been located and corrected. Such creation errors do not reflect on the standard of medical care.

## 2018-08-16 ENCOUNTER — Telehealth: Payer: Self-pay | Admitting: Certified Nurse Midwife

## 2018-08-16 NOTE — Telephone Encounter (Signed)
Spoke with patient. She has had several episodes of PMB in past few months. She states when she has this bleeding she also develops lower pelvic pain. She is not having symptoms now but is concerned. Advised will need to be evaluated. Will discuss with [name], CNM to see if she wants patient to have PUS/?EMB scheduled and call patient back to schedule.  Routed to provider.

## 2018-08-16 NOTE — Telephone Encounter (Signed)
Patient is having postmenopausal issues. Having back pain and pelvic pain with fatigue.

## 2018-08-16 NOTE — Telephone Encounter (Signed)
I think she should PUS and Endo biopsy if needed, due to bleeding. Please schedule with one of the MD.

## 2018-08-16 NOTE — Telephone Encounter (Signed)
Left message to call Damontae Loppnow, CMA. °

## 2018-08-19 NOTE — Telephone Encounter (Signed)
Left message to call Cella Cappello, CMA. °

## 2018-08-26 ENCOUNTER — Telehealth: Payer: Self-pay

## 2018-08-26 NOTE — Telephone Encounter (Signed)
Last night pt started having stabbing chest pain in the center of her chest and it continues today. Hurts worse when takes deep breath, sneezes, or coughs. Pt was advised to go to ED to be evaluated, she verbalized understanding.

## 2018-08-27 ENCOUNTER — Encounter (HOSPITAL_COMMUNITY): Payer: Self-pay

## 2018-08-27 ENCOUNTER — Other Ambulatory Visit: Payer: Self-pay

## 2018-08-27 ENCOUNTER — Emergency Department (HOSPITAL_COMMUNITY): Payer: BC Managed Care – PPO

## 2018-08-27 ENCOUNTER — Emergency Department (HOSPITAL_COMMUNITY)
Admission: EM | Admit: 2018-08-27 | Discharge: 2018-08-27 | Disposition: A | Discharge status: Home / Self Care | Payer: BC Managed Care – PPO | Attending: Emergency Medicine | Admitting: Emergency Medicine

## 2018-08-27 DIAGNOSIS — I10 Essential (primary) hypertension: Secondary | ICD-10-CM | POA: Diagnosis not present

## 2018-08-27 DIAGNOSIS — Z79899 Other long term (current) drug therapy: Secondary | ICD-10-CM | POA: Insufficient documentation

## 2018-08-27 DIAGNOSIS — R0789 Other chest pain: Secondary | ICD-10-CM | POA: Diagnosis not present

## 2018-08-27 DIAGNOSIS — J45909 Unspecified asthma, uncomplicated: Secondary | ICD-10-CM | POA: Diagnosis not present

## 2018-08-27 DIAGNOSIS — R079 Chest pain, unspecified: Secondary | ICD-10-CM | POA: Insufficient documentation

## 2018-08-27 DIAGNOSIS — R0602 Shortness of breath: Secondary | ICD-10-CM

## 2018-08-27 DIAGNOSIS — E039 Hypothyroidism, unspecified: Secondary | ICD-10-CM | POA: Insufficient documentation

## 2018-08-27 LAB — D-DIMER, QUANTITATIVE: D-Dimer, Quant: 0.38 ug/mL-FEU (ref 0.00–0.50)

## 2018-08-27 LAB — CBC
HCT: 43.3 % (ref 36.0–46.0)
Hemoglobin: 14.4 g/dL (ref 12.0–15.0)
MCH: 30.4 pg (ref 26.0–34.0)
MCHC: 33.3 g/dL (ref 30.0–36.0)
MCV: 91.4 fL (ref 80.0–100.0)
Platelets: 194 10*3/uL (ref 150–400)
RBC: 4.74 MIL/uL (ref 3.87–5.11)
RDW: 12.1 % (ref 11.5–15.5)
WBC: 5.6 10*3/uL (ref 4.0–10.5)
nRBC: 0 % (ref 0.0–0.2)

## 2018-08-27 LAB — BASIC METABOLIC PANEL
Anion gap: 9 (ref 5–15)
BUN: 14 mg/dL (ref 6–20)
CO2: 27 mmol/L (ref 22–32)
Calcium: 9 mg/dL (ref 8.9–10.3)
Chloride: 105 mmol/L (ref 98–111)
Creatinine, Ser: 0.69 mg/dL (ref 0.44–1.00)
GFR calc Af Amer: >60 mL/min (ref >60)
GFR calc non Af Amer: >60 mL/min (ref >60)
Glucose, Bld: 111 mg/dL — ABNORMAL HIGH (ref 70–99)
Potassium: 4.1 mmol/L (ref 3.5–5.1)
Sodium: 141 mmol/L (ref 135–145)

## 2018-08-27 LAB — TROPONIN I (HIGH SENSITIVITY)
Troponin I (High Sensitivity): <2 ng/L (ref <18)
Troponin I (High Sensitivity): <2 ng/L (ref <18)

## 2018-08-27 MED ORDER — SODIUM CHLORIDE 0.9% FLUSH: 3.0000 mL | Freq: Once | INTRAVENOUS | Status: DC

## 2018-08-27 NOTE — ED Triage Notes (Addendum)
Patient arrived POV.   C/o left chest pain that started yesterday (8/10sharp) with some shob. Worse when she breathes and walks up steps. States last few days she has been very fatigued.   Patient states humidity has been bad. Hx. Asthma     A/ox4 Ambulatory in triage.

## 2018-08-27 NOTE — ED Provider Notes (Signed)
[facility] DEPT Provider Note   CSN: [id number] Arrival date & time: 08/27/18  1228    History   Chief Complaint Chief Complaint  Patient presents with   Chest Pain    HPI Tiffany Horn is a 58 y.o. female.     HPI   Sharp pain left breastbone area Worse with movement/sitting up Haven't lifted anything For the last few days notes being more fatigued, and more short of breath going up the stairs  Pain comes and goes, more so today than yesterday, started yesterday, just suddenly started  If press on it it is severe sharp pain, never had that before  Has asthma, does feel dyspnea feels more like asthma, some wheezing, the heat seems to bring on asthma symptoms  Mild cough No congestion Appetite is ok, no loss of taste or smell No nausea or vomiting, no diarrhea  No exertional chest pain, just worse with movement  Asthma, chol, hx of htn but not for a long time and not on meds Grandfather died of heart disease  Mom had MI this Jan No known immediate fam hx of heart disease  No smoking, occ etoh, no other drugs  No long trips car or airplane, hx of blood clots, recent surgeries    Past Medical History:  Diagnosis Date   Anxiety    Arthritis    knees and spine, shoulder   Asthma    Bronchitis    hx - recurrent   Complication of anesthesia    waking up is not easy   Depression    Elevated IgE level 09/12/2017   09/12/2017 IgE 195   H/O miscarriage, not currently pregnant    Hx of irritable bowel syndrome    x2   Hyperlipidemia    diet controlled - no medication   Hypertension    not taking any meds at present - under control per patient   Hypokalemia    with PNA admission (2.5)   Migraines    Pneumonia    4 episodes; hosp. 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 [place] [facility] AND CURETTAGE OF UTERUS     LAPAROSCOPIC ABDOMINAL EXPLORATION  1994   endometriosis   ORIF HUMERUS FRACTURE Left 04/01/2013   DR Ninfa Linden - shoulder   ORIF HUMERUS FRACTURE Left 04/01/2013   Procedure: OPEN REDUCTION INTERNAL FIXATION (ORIF) LEFT PROXIMAL HUMERUS FRACTURE;  Surgeon: Mcarthur Rossetti, MD;  Location: [facility];  Service: Orthopedics;  Laterality: Left;   TONSILLECTOMY AND ADENOIDECTOMY       OB History    Gravida  2   Para  0   Term  0   Preterm  0   AB  2   Living  0     SAB  2   TAB  Ectopic      Multiple      Live Births               Home Medications    Prior to Admission medications   Medication Sig Start Date End Date Taking? Authorizing Provider  albuterol (PROVENTIL HFA;VENTOLIN HFA) 108 (90 Base) MCG/ACT inhaler Inhale 2 puffs into the lungs every 6 (six) hours as needed for wheezing or shortness of breath. 09/07/17   Icard, Octavio Graves, DO  clonazePAM (KLONOPIN) 0.5 MG tablet TAKE 1 TABLET BY MOUTH 3 TIMES A DAY AS NEEDED FOR ANXIETY 08/14/18   Hurst, Helene Kelp T, PA-C  fluticasone furoate-vilanterol (BREO ELLIPTA) 200-25 MCG/INH AEPB Inhale 1 puff into the lungs daily. 09/12/17   Icard, Octavio Graves, DO  levothyroxine (SYNTHROID, LEVOTHROID) 112 MCG tablet Take 1 tablet (112 mcg total) by mouth daily. 07/19/17   Kuneff, Renee A, DO  montelukast (SINGULAIR) 10 MG tablet Take 1 tablet (10 mg  total) by mouth at bedtime. 09/07/17   Icard, Octavio Graves, DO  PARoxetine (PAXIL) 30 MG tablet Take 1 tablet (30 mg total) by mouth daily. 08/14/18   Donnal Moat T, PA-C  rizatriptan (MAXALT) 5 MG tablet Take 1 tablet (5 mg total) by mouth as needed for migraine. May repeat in 2 hours if needed 08/14/18   Donnal Moat T, PA-C  Spacer/Aero-Holding Chambers (AEROCHAMBER MV) inhaler Use as instructed 09/12/17   Garner Nash, DO  SYMBICORT 160-4.5 MCG/ACT inhaler  11/13/17   [provider]  Tiotropium Bromide Monohydrate (SPIRIVA RESPIMAT) 1.25 MCG/ACT AERS Inhale 2 puffs into the lungs daily. Patient not taking: Reported on 08/14/2018 10/10/17   Garner Nash, DO    Family History Family History  Problem Relation Age of Onset   Breast cancer Mother    Diabetes Mother    Heart disease Mother    Diabetes Father    Dementia Father    Colon cancer Father    Stroke Paternal Grandmother    Diabetes Paternal Grandmother     Social History Social History   Tobacco Use   Smoking status: Never Smoker   Smokeless tobacco: Never Used  Substance Use Topics   Alcohol use: Yes    Alcohol/week: 1.0 - 3.0 standard drinks    Types: 1 - 3 Glasses of wine per week   Drug use: No     Allergies   Amoxicillin and Seroquel [quetiapine fumarate]   Review of Systems Review of Systems  Constitutional: Positive for fatigue. Negative for fever.  HENT: Negative for sore throat.   Eyes: Negative for visual disturbance.  Respiratory: Positive for shortness of breath. Negative for cough.   Cardiovascular: Positive for chest pain. Negative for palpitations.  Gastrointestinal: Negative for abdominal pain, diarrhea, nausea and vomiting.  Genitourinary: Negative for difficulty urinating.  Musculoskeletal: Negative for back pain and neck pain.  Skin: Negative for rash.  Neurological: Negative for syncope, light-headedness and headaches.     Physical Exam Updated Vital Signs BP  131/88    Pulse 67    Temp 97.8 F (36.6 C) (Oral)    Resp 18    LMP 01/31/2007    SpO2 99%   Physical Exam Vitals signs and nursing note reviewed.  Constitutional:      General: She is not in acute distress.    Appearance: She is well-developed. She is not diaphoretic.  HENT:     Head: Normocephalic and atraumatic.  Eyes:     Conjunctiva/sclera: Conjunctivae normal.  Neck:     Musculoskeletal: Normal range of motion.  Cardiovascular:     Rate and Rhythm: Normal rate and regular rhythm.     Heart sounds: Normal heart sounds. No murmur. No friction rub. No gallop.   Pulmonary:     Effort: Pulmonary effort is normal. No respiratory distress.     Breath sounds: Normal breath sounds. No wheezing or rales.  Abdominal:     General: There is no distension.     Palpations: Abdomen is soft.     Tenderness: There is no abdominal tenderness. There is no guarding.  Musculoskeletal:        General: No tenderness.  Skin:    General: Skin is warm and dry.     Findings: No erythema or rash.  Neurological:     Mental Status: She is alert and oriented to person, place, and time.      ED Treatments / Results  Labs (all labs ordered are listed, but only abnormal results are displayed) Labs Reviewed  BASIC METABOLIC PANEL - Abnormal; Notable for the following components:      Result Value   Glucose, Bld 111 (*)    All other components within normal limits  CBC  D-DIMER, QUANTITATIVE (NOT AT [facility]  TROPONIN I (HIGH SENSITIVITY)  TROPONIN I (HIGH SENSITIVITY)    EKG EKG Interpretation  Date/Time:  Tuesday August 27 2018 12:38:15 EDT Ventricular Rate:  99 PR Interval:    QRS Duration: 95 QT Interval:  354 QTC Calculation: 455 R Axis:   59 Text Interpretation:  Sinus rhythm Low voltage, precordial leads Borderline T abnormalities, anterior leads No significant change was found Confirmed by Gareth Morgan [phone]) on 08/27/2018 4:02:36 PM   Radiology Dg Chest 2 View  Result Date:  08/27/2018 CLINICAL DATA:  Chest pain and shortness of breath EXAM: CHEST - 2 VIEW COMPARISON:  July 17, 2017 FINDINGS: The lungs are clear. Heart size and pulmonary vascularity are normal. No adenopathy. No pneumothorax. There is postoperative change in the proximal left humerus. IMPRESSION: No edema or consolidation. Electronically Signed   By: Lowella Grip III M.D.   On: 08/27/2018 14:40    Procedures Procedures (including critical care time)  Medications Ordered in ED Medications - No data to display   Initial Impression / Assessment and Plan / ED Course  I have reviewed the triage vital signs and the nursing notes.  Pertinent labs & imaging results that were available during my care of the patient were reviewed by me and considered in my medical decision making (see chart for details).        58yo female with history of hlpd, asthma, past htn but improved, presents with concern for sharp chest pain and dyspnea.   Differential diagnosis for chest pain includes pulmonary embolus, dissection, pneumothorax, pneumonia, ACS, myocarditis, pericarditis.  EKG was done and evaluate by me and showed no acute ST changes and no signs of pericarditis. Chest x-ray was done and evaluated by me and radiology and showed no sign of pneumonia or pneumothorax. Patient is low risk [place] with a negative ddimer and have low suspicion for PE.  Pain very atypical for cardiac pain, is sharp, worse with palpation and movements and has 2 negative troponins.   Patient most likely with musculoskeletal chest pain given pain with palpation as well as her movements. Patient discharged in stable condition with understanding of reasons to return and recommend PCP follow up.   Final Clinical Impressions(s) / ED Diagnoses  Final diagnoses:  Chest pain, unspecified type  Shortness of breath  Musculoskeletal chest pain    ED Discharge Orders    None       Gareth Morgan, MD 08/28/18 1528

## 2018-09-08 ENCOUNTER — Other Ambulatory Visit: Payer: Self-pay | Admitting: Physician Assistant

## 2018-09-12 NOTE — Telephone Encounter (Signed)
Message left to return call to Triage Nurse at [phone].    

## 2018-09-12 NOTE — Telephone Encounter (Signed)
Just following up on this encounter.

## 2018-09-23 NOTE — Telephone Encounter (Signed)
Routing to [name], CNM to review and advise. Patient has not returned call x2.

## 2018-09-26 NOTE — Telephone Encounter (Signed)
Letter to [name], CNM to review and sign.

## 2018-09-27 NOTE — Telephone Encounter (Signed)
Placed on [employer]

## 2018-09-30 NOTE — Telephone Encounter (Addendum)
Letter mailed to patient after review and signature by [name], CNM.   Routing to provider and will close encounter.

## 2018-10-04 ENCOUNTER — Telehealth: Payer: Self-pay | Admitting: Certified Nurse Midwife

## 2018-10-04 DIAGNOSIS — N950 Postmenopausal bleeding: Secondary | ICD-10-CM

## 2018-10-04 NOTE — Telephone Encounter (Signed)
Patient states she previously had some post-menopausal spotting and cramping that has been gone for awhile now. She received a message from Dr. Quincy Simmonds on [place] that she wanted to do a pelvic ultrasound. States she is unsure if this is still necessary since she is no longer having the problem, but if Dr. Quincy Simmonds feels she needs to have it done, she will. Please advise.

## 2018-10-04 NOTE — Telephone Encounter (Signed)
Spoke with patient. Patient received letter with recommendations for PUS, possible EMB for PMB, asking if still needed? Advised evaluation is still needed for PMB. Patient states she is unavailable to Thursdays, can only schedule late appt on Tuesday. PUS with possible EMB scheduled for 9/22 at 4pm, consult at 4:30pm with Dr. Quincy Simmonds. Advised to take Motrin 800 mg with food and water one hour before procedure. Advised patient Dr. Quincy Simmonds will need to review, will return call if any additional recommendations.   Order placed for precert.   Routing to provider for final review. Patient is agreeable to disposition. Will close encounter.  Cc: Lerry Liner, Magdalene Patricia

## 2018-10-09 ENCOUNTER — Telehealth: Payer: Self-pay | Admitting: Obstetrics and Gynecology

## 2018-10-09 ENCOUNTER — Other Ambulatory Visit: Payer: Self-pay

## 2018-10-09 ENCOUNTER — Telehealth: Payer: Self-pay | Admitting: Physician Assistant

## 2018-10-09 MED ORDER — PAROXETINE HCL 40 MG PO TABS: 40.0000 mg | ORAL_TABLET | Freq: Every day | ORAL | 1 refills | Status: DC

## 2018-10-09 NOTE — Telephone Encounter (Signed)
Pt called stated she needed to speak with Helene Kelp asap and that she would know what it is about. TH ask me to send it to you guys and to triage. She is in the dark on this one.

## 2018-10-09 NOTE — Telephone Encounter (Signed)
error 

## 2018-10-09 NOTE — Telephone Encounter (Signed)
See Beth's note above. Increase Paxil to 40mg  qd.  Encourage her to take the Klonopin when needed.  If she's never tried Trazodone, we can add that for sleep.  50 mg, 1-2 qhs prn. Needs to make appt.

## 2018-10-09 NOTE — Telephone Encounter (Signed)
Call placed to patient to review benefit for scheduled ultrasound appointment with possible endometrial biopsy. Left voicemail message requesting a return call

## 2018-10-09 NOTE — Telephone Encounter (Signed)
Pt called to say that increase to Paxil 30mg  is not helping her.  She is talking to grief therapist right now and her therapist is concerned that she may have a mood disorder. She has a lot of up and down moods, some rage that concerns her. Wants to discuss.  She is feeling a lot of depression and anxiety- not slept in two days.  Two deaths recently.  Please try her again, she'll answer private number.

## 2018-10-10 NOTE — Telephone Encounter (Signed)
Reviewed

## 2018-10-18 ENCOUNTER — Other Ambulatory Visit: Payer: Self-pay

## 2018-10-22 ENCOUNTER — Ambulatory Visit (INDEPENDENT_AMBULATORY_CARE_PROVIDER_SITE_OTHER): Payer: BC Managed Care – PPO

## 2018-10-22 ENCOUNTER — Encounter: Payer: Self-pay | Admitting: Obstetrics and Gynecology

## 2018-10-22 ENCOUNTER — Other Ambulatory Visit: Payer: Self-pay

## 2018-10-22 ENCOUNTER — Ambulatory Visit: Payer: BC Managed Care – PPO | Admitting: Obstetrics and Gynecology

## 2018-10-22 DIAGNOSIS — N950 Postmenopausal bleeding: Secondary | ICD-10-CM

## 2018-10-22 NOTE — Progress Notes (Signed)
GYNECOLOGY  VISIT   HPI: 58 y.o.   Divorced  Caucasian  female   [id number] with Patient's last menstrual period was 01/31/2007.   here for  Recurrent postmenopausal bleeding.    Patient was experiencing cramping across her lower abdomen and she noted a small amount of blood with wiping, which occurred a couple of months ago.   This was the only time she had bleeding.   No HRT ever. No local vaginal estrogen.   No prior intercourse or medication use.   Not sexually active for years.  GYNECOLOGIC HISTORY: Patient's last menstrual period was 01/31/2007. Contraception:  Postmenopausal Menopausal hormone therapy:  none Last mammogram:  02/19/18 BIRADS 1 negative/density a Last pap smear:   02/20/18 Neg:Neg HR HPV        OB History    Gravida  2   Para  0   Term  0   Preterm  0   AB  2   Living  0     SAB  2   TAB      Ectopic      Multiple      Live Births                 Patient Active Problem List   Diagnosis Date Noted  . Elevated IgE level 09/12/2017  . 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 Dizziness 09/29/2016  . Headache, migraine 02/15/2015  . Vitamin D deficiency 10/19/2014  . Encounter for preventive health examination 10/19/2014  . Depression with anxiety 09/02/2014  . Dyspnea on exertion 08/26/2014  . Hyperlipidemia 08/18/2014  . Hypothyroidism 08/18/2014  . BMI 28.0-28.9,adult 08/18/2014    Past Medical History:  Diagnosis Date  . Anxiety   . Arthritis    knees and spine, shoulder  . Asthma   . Bronchitis    hx - recurrent  . Complication of anesthesia    waking up is not easy  . Depression   . Elevated IgE level 09/12/2017   09/12/2017 IgE 195  . H/O miscarriage, not currently pregnant   . Hx of irritable bowel syndrome    x2  . Hyperlipidemia    diet controlled - no medication  . Hypertension    not taking any meds at present - under control per patient  . Hypokalemia    with PNA admission (2.5)  .  Migraines   . Pneumonia    4 episodes; hosp. admission 2014  . PONV (postoperative nausea and vomiting)   . Thyroid disease     Past Surgical History:  Procedure Laterality Date  . BREAST SURGERY     implants, then had them removed  . COLONOSCOPY     greater 10 yrs ago - ? [place]    . LAPAROSCOPIC ABDOMINAL EXPLORATION  1994   endometriosis  . ORIF HUMERUS FRACTURE Left 04/01/2013   [facility] - shoulder  . ORIF HUMERUS FRACTURE Left 04/01/2013   Procedure: OPEN REDUCTION INTERNAL FIXATION (ORIF) LEFT PROXIMAL HUMERUS FRACTURE;  Surgeon: Mcarthur Rossetti, MD;  Location: [facility];  Service: Orthopedics;  Laterality: Left;  . TONSILLECTOMY AND ADENOIDECTOMY      Current Outpatient Medications  Medication Sig Dispense Refill  . albuterol (PROVENTIL HFA;VENTOLIN HFA) 108 (90 Base) MCG/ACT inhaler Inhale 2 puffs into the lungs every 6 (six) hours as needed for wheezing or shortness of breath. 1 Inhaler 6  . clonazePAM (KLONOPIN) 0.5 MG tablet TAKE 1 TABLET BY  MOUTH 3 TIMES A DAY AS NEEDED FOR ANXIETY 90 tablet 1  . fluticasone furoate-vilanterol (BREO ELLIPTA) 200-25 MCG/INH AEPB Inhale 1 puff into the lungs daily. 1 each 3  . levothyroxine (SYNTHROID, LEVOTHROID) 112 MCG tablet Take 1 tablet (112 mcg total) by mouth daily. 90 tablet 1  . montelukast (SINGULAIR) 10 MG tablet Take 1 tablet (10 mg total) by mouth at bedtime. 30 tablet 11  . PARoxetine (PAXIL) 40 MG tablet Take 1 tablet (40 mg total) by mouth daily. 30 tablet 1  . rizatriptan (MAXALT) 5 MG tablet Take 1 tablet (5 mg total) by mouth as needed for migraine. May repeat in 2 hours if needed 10 tablet 5  . Spacer/Aero-Holding Chambers (AEROCHAMBER MV) inhaler Use as instructed 1 each 2  . SYMBICORT 160-4.5 MCG/ACT inhaler     . Tiotropium Bromide Monohydrate (SPIRIVA RESPIMAT) 1.25 MCG/ACT AERS Inhale 2 puffs into the lungs daily. 1 Inhaler 5   No current facility-administered  medications for this visit.      ALLERGIES: Amoxicillin and Seroquel [quetiapine fumarate]  Family History  Problem Relation Age of Onset  . Breast cancer Mother   . Diabetes Mother   . Heart disease Mother   . Diabetes Father   . Dementia Father   . Colon cancer Father   . Stroke Paternal Grandmother   . Diabetes Paternal Grandmother     Social History   Socioeconomic History  . Marital status: Divorced    Spouse name: Not on file  . Number of children: 0  . Years of education: Not on file  . Highest education level: Not on file  Occupational History  . Occupation: Control and instrumentation engineer  Social Needs  . Financial resource strain: Not on file  . Food insecurity    Worry: Not on file    Inability: Not on file  . Transportation needs    Medical: Not on file    Non-medical: Not on file  Tobacco Use  . Smoking status: Never Smoker  . Smokeless tobacco: Never Used  Substance and Sexual Activity  . Alcohol use: Yes    Alcohol/week: 1.0 - 3.0 standard drinks    Types: 1 - 3 Glasses of wine per week  . Drug use: No  . Sexual activity: Not Currently    Partners: Male    Birth control/protection: Post-menopausal  Lifestyle  . Physical activity    Days per week: Not on file    Minutes per session: Not on file  . Stress: Not on file  Relationships  . Social Herbalist on phone: Not on file    Gets together: Not on file    Attends religious service: Not on file    Active member of club or organization: Not on file    Attends meetings of clubs or organizations: Not on file    Relationship status: Not on file  . Intimate partner violence    Fear of current or ex partner: Not on file    Emotionally abused: Not on file    Physically abused: Not on file    Forced sexual activity: Not on file  Other Topics Concern  . Not on file  Social History Narrative   She is originally from [place] [facility] [place]. She has traveled to [facility] [place], [place], [place], [place], [place], [place], [place], [place], [place]. No  international travel. She has dogs. No prior bird, mold, or recent hot tub exposure. She hasn't used her hot tub in 1.5 years. 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 Previously enjoyed gardening and playing tennis. Helps to care for her mother.    Review of Systems  Constitutional: Negative.   HENT: Negative.   Eyes: Negative.   Respiratory: Negative.   Cardiovascular: Negative.   Gastrointestinal: Negative.   Endocrine: Negative.   Genitourinary: Negative.   Musculoskeletal: Negative.   Skin: Negative.   Allergic/Immunologic: Negative.   Neurological: Negative.   Hematological: Negative.   Psychiatric/Behavioral: Negative.     PHYSICAL EXAMINATION:    BP 114/60 (BP Location: Right Arm, Patient Position: Sitting, Cuff Size: Large)   Pulse 88   Temp 97.6 F (36.4 C) (Temporal)   Resp 12   Ht 5' 5.25" (1.657 m)   Wt 177 lb (80.3 kg)   LMP 01/31/2007   BMI 29.23 kg/m     General appearance: alert, cooperative and appears stated age              Pelvic US Uterus no masses. EMS 1.43 mm. No masses.  No fluid.  Ovaries normal. 7 mm cystic structure of left ovary.  No free fluid.  ASSESSMENT  Postmenopausal bleeding.  Likely is atrophy.   PLAN  We reviewed causes of postmenopausal bleeding today - atrophy, polyps, fibroids, infection, premalignant and malignant disease.  I reviewed her ultrasound findings and gave her reassurance.  We talked about endometrial biopsy - indications, risks and benefits. No biopsy needed today.  Patient is comfortable with this and prefers not to have one done.  Return for annual exam and with any recurrent bleeding.    An After Visit Summary was printed and given to the patient.  __25____ minutes face to face time of which over 50% was spent in counseling.

## 2018-10-23 NOTE — Telephone Encounter (Signed)
Ultrasound completed on 10/22/2018. Will close encounter

## 2018-11-09 ENCOUNTER — Other Ambulatory Visit: Payer: Self-pay | Admitting: Physician Assistant

## 2018-12-29 ENCOUNTER — Other Ambulatory Visit: Payer: Self-pay | Admitting: Physician Assistant

## 2018-12-30 ENCOUNTER — Telehealth: Payer: Self-pay | Admitting: Physician Assistant

## 2018-12-30 NOTE — Telephone Encounter (Signed)
Last apt 07/2018, was due back 4-6 weeks Nothing scheduled

## 2018-12-30 NOTE — Telephone Encounter (Signed)
Pt made appt for Jan 6, next available. She is dealing with a sick mother and she is dying. Very hard and she needs the klonopin. Can we please call it in? [facility]

## 2018-12-31 ENCOUNTER — Other Ambulatory Visit: Payer: Self-pay

## 2018-12-31 ENCOUNTER — Other Ambulatory Visit: Payer: Self-pay | Admitting: Physician Assistant

## 2018-12-31 DIAGNOSIS — Z20822 Contact with and (suspected) exposure to covid-19: Secondary | ICD-10-CM

## 2018-12-31 NOTE — Telephone Encounter (Signed)
Noted refill called in

## 2018-12-31 NOTE — Telephone Encounter (Signed)
Yes - thank you

## 2019-01-01 ENCOUNTER — Telehealth: Payer: Self-pay | Admitting: *Deleted

## 2019-01-01 LAB — NOVEL CORONAVIRUS, NAA: SARS-CoV-2, NAA: NOT DETECTED

## 2019-01-01 NOTE — Telephone Encounter (Signed)
Patient given negative covid results . 

## 2019-01-29 ENCOUNTER — Ambulatory Visit: Payer: BC Managed Care – PPO | Admitting: Family Medicine

## 2019-01-30 ENCOUNTER — Ambulatory Visit: Payer: BC Managed Care – PPO | Attending: Internal Medicine

## 2019-01-30 DIAGNOSIS — Z20822 Contact with and (suspected) exposure to covid-19: Secondary | ICD-10-CM

## 2019-01-31 ENCOUNTER — Encounter: Payer: Self-pay | Admitting: Obstetrics and Gynecology

## 2019-02-01 LAB — NOVEL CORONAVIRUS, NAA: SARS-CoV-2, NAA: NOT DETECTED

## 2019-02-03 ENCOUNTER — Telehealth: Payer: Self-pay | Admitting: Obstetrics and Gynecology

## 2019-02-03 NOTE — Telephone Encounter (Signed)
Patient says she is returning a call to Jill. 

## 2019-02-03 NOTE — Telephone Encounter (Signed)
Call to patient. Left detailed message, ok per dpr. Advised patient that her Covid19 results will release to her automatically via MyChart once they are completed. Please review MyChart results if you have not done so already. Return call to office if any additional questions.   Routing to provider for final review. Will close encounter.

## 2019-02-03 NOTE — Telephone Encounter (Signed)
Spoke with patient. See previous encounter dated 02/03/19. Questions answered.   Encounter closed.

## 2019-02-03 NOTE — Telephone Encounter (Signed)
Patient sent the following correspondence through [place].  Is my Covid test result back yet? I need to know before I can visit family. Thank you!

## 2019-02-06 ENCOUNTER — Ambulatory Visit (INDEPENDENT_AMBULATORY_CARE_PROVIDER_SITE_OTHER): Payer: BC Managed Care – PPO | Admitting: Physician Assistant

## 2019-02-06 ENCOUNTER — Encounter: Payer: Self-pay | Admitting: Physician Assistant

## 2019-02-06 DIAGNOSIS — F4320 Adjustment disorder, unspecified: Secondary | ICD-10-CM

## 2019-02-06 DIAGNOSIS — F411 Generalized anxiety disorder: Secondary | ICD-10-CM | POA: Diagnosis not present

## 2019-02-06 DIAGNOSIS — F331 Major depressive disorder, recurrent, moderate: Secondary | ICD-10-CM

## 2019-02-06 MED ORDER — PAROXETINE HCL 20 MG PO TABS: 10.0000 mg | ORAL_TABLET | Freq: Every day | ORAL | 0 refills | Status: DC

## 2019-02-06 NOTE — Progress Notes (Signed)
[facility] Med Check  Patient ID: Tiffany Horn,  MRN: [id number]  PCP: Tiffany Hillock, DO  Date of Evaluation: 02/06/2019 Time spent:15 minutes  Chief Complaint:  Chief Complaint    Anxiety; Depression; Follow-up      HISTORY/CURRENT STATUS: HPI For routine med check.  As of Dec., Mom has been put on hospice for CHF and liver failure.  "I'm having a hard time w/ that.  My best friend died in 05-Jan-2023 from a brain hemorrage. My brother had covid last fall and almost died.  It's been a hard year." Has lost 2 dogs recently and has several more that are older and may die soon. And Covid has hit [facility], where her mom is.  Thankfully her test was negative.   Is having to use the Klonopin tid. Needing it more often than was.  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  Also she just wants to sit on the couch all the time.  She has no energy or motivation.  This is gotten much worse in the past month or so.  She cries often but feels like it is due to anticipatory grief.  Denies suicidal or homicidal thoughts.  Denies dizziness, syncope, seizures, numbness, tingling, tremor, tics, unsteady gait, slurred speech, confusion. Denies muscle or joint pain, stiffness, or dystonia.  Individual Medical History/ Review of Systems: Changes? :No    Past medications for mental health diagnoses include: 'everything'  Paxil works best.   Allergies: Amoxicillin and Seroquel [quetiapine fumarate]  Current Medications:  Current Outpatient Medications:  .  albuterol (PROVENTIL HFA;VENTOLIN HFA) 108 (90 Base) MCG/ACT inhaler, Inhale 2 puffs into the lungs every 6 (six) hours as needed for wheezing or shortness of breath., Disp: 1 Inhaler, Rfl: 6 .  clonazePAM (KLONOPIN) 0.5 MG tablet, TAKE 1 TABLET BY MOUTH THREE TIMES A DAY AS NEEDED FOR ANXIETY, Disp: 90 tablet, Rfl: 1 .  montelukast (SINGULAIR) 10 MG tablet, Take 1 tablet (10 mg total) by mouth at bedtime.,  Disp: 30 tablet, Rfl: 11 .  PARoxetine (PAXIL) 40 MG tablet, TAKE 1 TABLET BY MOUTH EVERY DAY, Disp: 90 tablet, Rfl: 1 .  rizatriptan (MAXALT) 5 MG tablet, Take 1 tablet (5 mg total) by mouth as needed for migraine. May repeat in 2 hours if needed, Disp: 10 tablet, Rfl: 5 .  Spacer/Aero-Holding Chambers (AEROCHAMBER MV) inhaler, Use as instructed, Disp: 1 each, Rfl: 2 .  fluticasone furoate-vilanterol (BREO ELLIPTA) 200-25 MCG/INH AEPB, Inhale 1 puff into the lungs daily. (Patient not taking: Reported on 02/06/2019), Disp: 1 each, Rfl: 3 .  levothyroxine (SYNTHROID, LEVOTHROID) 112 MCG tablet, Take 1 tablet (112 mcg total) by mouth daily. (Patient not taking: Reported on 02/06/2019), Disp: 90 tablet, Rfl: 1 .  PARoxetine (PAXIL) 20 MG tablet, Take 0.5 tablets (10 mg total) by mouth daily., Disp: 15 tablet, Rfl: 0 .  SYMBICORT 160-4.5 MCG/ACT inhaler, , Disp: , Rfl:  .  Tiotropium Bromide Monohydrate (SPIRIVA RESPIMAT) 1.25 MCG/ACT AERS, Inhale 2 puffs into the lungs daily. (Patient not taking: Reported on 02/06/2019), Disp: 1 Inhaler, Rfl: 5 Medication Side Effects: none  Family Medical/ Social History: Changes? No  MENTAL HEALTH EXAM:  Last menstrual period 01/31/2007.There is no height or weight on file to calculate BMI.  General Appearance: unable to assess  Eye Contact:  unable to assess  Speech:  Clear and Coherent  Volume:  Normal  Mood:  sad  Affect:  unable to assess  Thought Process:  Goal Directed and Descriptions of Associations: Intact  Orientation:  Full (Time, Place, and Person)  Thought Content: Logical   Suicidal Thoughts:  No  Homicidal Thoughts:  No  Memory:  WNL  Judgement:  Good  Insight:  Good  Psychomotor Activity:  unable to assess  Concentration:  Concentration: Good  Recall:  Good  Fund of Knowledge: Good  Language: Good  Assets:  Desire for Improvement  ADL's:  Intact  Cognition: WNL  Prognosis:  Good    DIAGNOSES:    ICD-10-CM   1. Major depressive  disorder, recurrent episode, moderate (HCC)  F33.1   2. Generalized anxiety disorder  F41.1   3. 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 Continue Klonopin 0.5mg  1 po tid prn. She will be seeing her PCP tomorrow and will discuss the levothyroxine.  I reminded the patient that dysregulation of TSH can affect mental health and cause, or make depression worse.  What ever her PCP recommends, I advise she follow those directions. Continue therapy. Return in 4 to 6 weeks.   Donnal Moat, PA-C

## 2019-02-07 ENCOUNTER — Ambulatory Visit: Payer: BC Managed Care – PPO | Admitting: Family Medicine

## 2019-02-07 ENCOUNTER — Encounter: Payer: Self-pay | Admitting: Family Medicine

## 2019-02-07 ENCOUNTER — Other Ambulatory Visit: Payer: Self-pay

## 2019-02-07 VITALS — BP 110/75 | HR 74 | Temp 97.4°F | Resp 16 | Ht 65.0 in | Wt 183.4 lb

## 2019-02-07 DIAGNOSIS — E039 Hypothyroidism, unspecified: Secondary | ICD-10-CM | POA: Diagnosis not present

## 2019-02-07 DIAGNOSIS — R631 Polydipsia: Secondary | ICD-10-CM

## 2019-02-07 DIAGNOSIS — R350 Frequency of micturition: Secondary | ICD-10-CM

## 2019-02-07 DIAGNOSIS — R8290 Unspecified abnormal findings in urine: Secondary | ICD-10-CM | POA: Diagnosis not present

## 2019-02-07 DIAGNOSIS — Z713 Dietary counseling and surveillance: Secondary | ICD-10-CM

## 2019-02-07 LAB — POCT URINALYSIS DIPSTICK
Bilirubin, UA: NEGATIVE
Blood, UA: NEGATIVE
Glucose, UA: NEGATIVE
Ketones, UA: NEGATIVE
Nitrite, UA: NEGATIVE
Protein, UA: NEGATIVE
Spec Grav, UA: 1.015 (ref 1.010–1.025)
Urobilinogen, UA: 0.2 E.U./dL
pH, UA: 6 (ref 5.0–8.0)

## 2019-02-07 MED ORDER — NALTREXONE-BUPROPION HCL ER 8-90 MG PO TB12: ORAL_TABLET | ORAL | 2 refills | Status: DC

## 2019-02-07 MED ORDER — LEVOTHYROXINE SODIUM 112 MCG PO TABS: 112.0000 ug | ORAL_TABLET | Freq: Every day | ORAL | 0 refills | Status: DC

## 2019-02-07 NOTE — Patient Instructions (Signed)
Follow up in 8 weeks with physical appt and fasting for labs.    Calorie Counting for Weight Loss Calories are units of energy. Your body needs a certain amount of calories from food to keep you going throughout the day. When you eat more calories than your body needs, your body stores the extra calories as fat. When you eat fewer calories than your body needs, your body burns fat to get the energy it needs. Calorie counting means keeping track of how many calories you eat and drink each day. Calorie counting can be helpful if you need to lose weight. If you make sure to eat fewer calories than your body needs, you should lose weight. Ask your health care provider what a healthy weight is for you. For calorie counting to work, you will need to eat the right number of calories in a day in order to lose a healthy amount of weight per week. A dietitian can help you determine how many calories you need in a day and will give you suggestions on how to reach your calorie goal.  A healthy amount of weight to lose per week is usually 1-2 lb (0.5-0.9 kg). This usually means that your daily calorie intake should be reduced by 500-750 calories.  Eating 1,200 - 1,500 calories per day can help most women lose weight.  Eating 1,500 - 1,800 calories per day can help most men lose weight. What is my plan? My goal is to have __________ calories per day. If I have this many calories per day, I should lose around __________ pounds per week. What do I need to know about calorie counting? In order to meet your daily calorie goal, you will need to:  Find out how many calories are in each food you would like to eat. Try to do this before you eat.  Decide how much of the food you plan to eat.  Write down what you ate and how many calories it had. Doing this is called keeping a food log. To successfully lose weight, it is important to balance calorie counting with a healthy lifestyle that includes regular activity.  Aim for 150 minutes of moderate exercise (such as walking) or 75 minutes of vigorous exercise (such as running) each week. Where do I find calorie information?  The number of calories in a food can be found on a Nutrition Facts label. If a food does not have a Nutrition Facts label, try to look up the calories online or ask your dietitian for help. Remember that calories are listed per serving. If you choose to have more than one serving of a food, you will have to multiply the calories per serving by the amount of servings you plan to eat. For example, the label on a package of bread might say that a serving size is 1 slice and that there are 90 calories in a serving. If you eat 1 slice, you will have eaten 90 calories. If you eat 2 slices, you will have eaten 180 calories. How do I keep a food log? Immediately after each meal, record the following information in your food log:  What you ate. Don't forget to include toppings, sauces, and other extras on the food.  How much you ate. This can be measured in cups, ounces, or number of items.  How many calories each food and drink had.  The total number of calories in the meal. Keep your food log near you, such as in a small  notebook in your pocket, or use a mobile app or website. Some programs will calculate calories for you and show you how many calories you have left for the day to meet your goal. What are some calorie counting tips?   Use your calories on foods and drinks that will fill you up and not leave you hungry: ? Some examples of foods that fill you up are nuts and nut butters, vegetables, lean proteins, and high-fiber foods like whole grains. High-fiber foods are foods with more than 5 g fiber per serving. ? Drinks such as sodas, specialty coffee drinks, alcohol, and juices have a lot of calories, yet do not fill you up.  Eat nutritious foods and avoid empty calories. Empty calories are calories you get from foods or beverages that  do not have many vitamins or protein, such as candy, sweets, and soda. It is better to have a nutritious high-calorie food (such as an avocado) than a food with few nutrients (such as a bag of chips).  Know how many calories are in the foods you eat most often. This will help you calculate calorie counts faster.  Pay attention to calories in drinks. Low-calorie drinks include water and unsweetened drinks.  Pay attention to nutrition labels for "low fat" or "fat free" foods. These foods sometimes have the same amount of calories or more calories than the full fat versions. They also often have added sugar, starch, or salt, to make up for flavor that was removed with the fat.  Find a way of tracking calories that works for you. Get creative. Try different apps or programs if writing down calories does not work for you. What are some portion control tips?  Know how many calories are in a serving. This will help you know how many servings of a certain food you can have.  Use a measuring cup to measure serving sizes. You could also try weighing out portions on a kitchen scale. With time, you will be able to estimate serving sizes for some foods.  Take some time to put servings of different foods on your favorite plates, bowls, and cups so you know what a serving looks like.  Try not to eat straight from a bag or box. Doing this can lead to overeating. Put the amount you would like to eat in a cup or on a plate to make sure you are eating the right portion.  Use smaller plates, glasses, and bowls to prevent overeating.  Try not to multitask (for example, watch TV or use your computer) while eating. If it is time to eat, sit down at a table and enjoy your food. This will help you to know when you are full. It will also help you to be aware of what you are eating and how much you are eating. What are tips for following this plan? Reading food labels  Check the calorie count compared to the serving  size. The serving size may be smaller than what you are used to eating.  Check the source of the calories. Make sure the food you are eating is high in vitamins and protein and low in saturated and trans fats. Shopping  Read nutrition labels while you shop. This will help you make healthy decisions before you decide to purchase your food.  Make a grocery list and stick to it. Cooking  Try to cook your favorite foods in a healthier way. For example, try baking instead of frying.  Use low-fat dairy products.  Meal planning  Use more fruits and vegetables. Half of your plate should be fruits and vegetables.  Include lean proteins like poultry and fish. How do I count calories when eating out?  Ask for smaller portion sizes.  Consider sharing an entree and sides instead of getting your own entree.  If you get your own entree, eat only half. Ask for a box at the beginning of your meal and put the rest of your entree in it so you are not tempted to eat it.  If calories are listed on the menu, choose the lower calorie options.  Choose dishes that include vegetables, fruits, whole grains, low-fat dairy products, and lean protein.  Choose items that are boiled, broiled, grilled, or steamed. Stay away from items that are buttered, battered, fried, or served with cream sauce. Items labeled "crispy" are usually fried, unless stated otherwise.  Choose water, low-fat milk, unsweetened iced tea, or other drinks without added sugar. If you want an alcoholic beverage, choose a lower calorie option such as a glass of wine or light beer.  Ask for dressings, sauces, and syrups on the side. These are usually high in calories, so you should limit the amount you eat.  If you want a salad, choose a garden salad and ask for grilled meats. Avoid extra toppings like bacon, cheese, or fried items. Ask for the dressing on the side, or ask for olive oil and vinegar or lemon to use as dressing.  Estimate how  many servings of a food you are given. For example, a serving of cooked rice is  cup or about the size of half a baseball. Knowing serving sizes will help you be aware of how much food you are eating at restaurants. The list below tells you how big or small some common portion sizes are based on everyday objects: ? 1 oz--4 stacked dice. ? 3 oz--1 deck of cards. ? 1 tsp--1 die. ? 1 Tbsp-- a ping-pong ball. ? 2 Tbsp--1 ping-pong ball. ?  cup-- baseball. ? 1 cup--1 baseball. Summary  Calorie counting means keeping track of how many calories you eat and drink each day. If you eat fewer calories than your body needs, you should lose weight.  A healthy amount of weight to lose per week is usually 1-2 lb (0.5-0.9 kg). This usually means reducing your daily calorie intake by 500-750 calories.  The number of calories in a food can be found on a Nutrition Facts label. If a food does not have a Nutrition Facts label, try to look up the calories online or ask your dietitian for help.  Use your calories on foods and drinks that will fill you up, and not on foods and drinks that will leave you hungry.  Use smaller plates, glasses, and bowls to prevent overeating. This information is not intended to replace advice given to you by your health care provider. Make sure you discuss any questions you have with your health care provider. Document Revised: 10/05/2017 Document Reviewed: 12/17/2015 Elsevier Patient Education  [place].

## 2019-02-07 NOTE — Progress Notes (Signed)
This visit occurred during the SARS-CoV-2 public health emergency.  Safety protocols were in place, including screening questions prior to the visit, additional usage of staff PPE, and extensive cleaning of exam room while observing appropriate contact time as indicated for disinfecting solutions.    Tiffany Horn , 24-Jan-1961, 59 y.o., female MRN: [id number] Patient Care Team    Relationship Specialty Notifications Start End  [name], DO PCP - General Family Medicine  08/18/14   [name], MD Consulting Physician Gastroenterology  05/29/16   [name], CNM Referring Physician Certified Nurse Midwife  05/29/16   Gari Crown, MD Referring Physician Obstetrics and Gynecology  07/17/17     Chief Complaint  Patient presents with  . Urinary Frequency    Pt has had increase in urinary frequnecy and concerened she has diabetes. She has also had extreme thirst with itching skin.   . Ear Pain    Pt would like ears cleaned out today if needed. L ear.      Subjective: Pt presents for an OV with multiple complaints.  Ear discomfort.  Patient reports she has ongoing ear discomfort.  She reports discomfort in bilateral ears, however her left ear is worse than her right.  She feels that her left ear is clogged.  She has been using over-the-counter eardrops.  She denies any ringing in her ear.  She denies any infection.  Urinary frequency/increased thirst: Patient reports urinary frequency with increased thirst associated for the last 4 weeks.  She denies dysuria or hematuria.  She denies fever, chills, nausea or vomit.  Hypothyroidism: Patient has a history of hyperthyroidism.  She has been noncompliant with medications in the past.  She reports she did run out of medications approximately 1 month ago and has not been on her thyroid medicine.  She would like to restart.  Weight loss counseling: Patient reports she has gained weight.  She feels she eats a relatively  healthy diet.  She is attempting to get exercise.  Before Covid she would walk daily, she has cut back on that some.  She has continued to gain weight over the last year.  She does have hypothyroidism that is not well controlled.  Depression screen [facility] 2/9 03/06/2017 09/29/2016 05/29/2016 09/01/2014  Decreased Interest 2 1 0 3  Down, Depressed, Hopeless 2 3 0 3  PHQ - 2 Score 4 4 0 6  Altered sleeping 2 0 - 3  Tired, decreased energy 3 3 - 3  Change in appetite 0 0 - 3  Feeling bad or failure about yourself  0 0 - 3  Trouble concentrating 1 2 - 3  Moving slowly or fidgety/restless 0 2 - 0  Suicidal thoughts 0 0 - 0  PHQ-9 Score 10 11 - 21    Allergies  Allergen Reactions  . Amoxicillin Diarrhea  . Seroquel [Quetiapine Fumarate]     Unknown   Social History   Social History Narrative   She is originally from [place]. She has traveled to [facility], [place], [place], [place], [place], [place], [place], [place], [place]. No international travel. She has dogs. No prior bird, mold, or recent hot tub exposure. She hasn't used her hot tub in 1.5 years. 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 Previously enjoyed gardening and playing tennis. Helps to care for her mother.   Past Medical History:  Diagnosis Date  . Anxiety   . Arthritis  knees and spine, shoulder  . Asthma   . Bronchitis    hx - recurrent  . Complication of anesthesia    waking up is not easy  . Depression   . Elevated IgE level 09/12/2017   09/12/2017 IgE 195  . H/O miscarriage, not currently pregnant   . Hx of irritable bowel syndrome    x2  . Hyperlipidemia    diet controlled - no medication  . Hypertension    not taking any meds at present - under control per patient  . Hypokalemia    with PNA admission (2.5)  . Migraines   . Pneumonia    4 episodes; hosp. admission 2014  . PONV (postoperative nausea and vomiting)   . Thyroid disease    Past Surgical History:  Procedure Laterality Date  .  BREAST SURGERY     implants, then had them removed  . COLONOSCOPY     greater 10 yrs ago - ? [place]    . LAPAROSCOPIC ABDOMINAL EXPLORATION  1994   endometriosis  . ORIF HUMERUS FRACTURE Left 04/01/2013   [facility] - shoulder  . ORIF HUMERUS FRACTURE Left 04/01/2013   Procedure: OPEN REDUCTION INTERNAL FIXATION (ORIF) LEFT PROXIMAL HUMERUS FRACTURE;  Surgeon: Mcarthur Rossetti, MD;  Location: [facility];  Service: Orthopedics;  Laterality: Left;  . TONSILLECTOMY AND ADENOIDECTOMY     Family History  Problem Relation Age of Onset  . Breast cancer Mother   . Diabetes Mother   . Heart disease Mother   . Diabetes Father   . Dementia Father   . Colon cancer Father   . Stroke Paternal Grandmother   . Diabetes Paternal Grandmother    Allergies as of 02/07/2019      Reactions   Amoxicillin Diarrhea   Seroquel [quetiapine Fumarate]    Unknown      Medication List       Accurate as of February 07, 2019 10:43 AM. If you have any questions, ask your nurse or doctor.        STOP taking these medications   fluticasone furoate-vilanterol 200-25 MCG/INH Aepb Commonly known as: BREO ELLIPTA Stopped by: Howard Pouch, DO   Symbicort 160-4.5 MCG/ACT inhaler Generic drug: budesonide-formoterol Stopped by: Howard Pouch, DO   Tiotropium Bromide Monohydrate 1.25 MCG/ACT Aers Commonly known as: Spiriva Respimat Stopped by: Howard Pouch, DO     TAKE these medications   AeroChamber MV inhaler Use as instructed   albuterol 108 (90 Base) MCG/ACT inhaler Commonly known as: VENTOLIN HFA Inhale 2 puffs into the lungs every 6 (six) hours as needed for wheezing or shortness of breath.   clonazePAM 0.5 MG tablet Commonly known as: KLONOPIN TAKE 1 TABLET BY MOUTH THREE TIMES A DAY AS NEEDED FOR ANXIETY   levothyroxine 112 MCG tablet Commonly known as: SYNTHROID Take 1 tablet (112 mcg total) by mouth daily.   montelukast 10 MG tablet Commonly  known as: SINGULAIR Take 1 tablet (10 mg total) by mouth at bedtime.   PARoxetine 40 MG tablet Commonly known as: PAXIL TAKE 1 TABLET BY MOUTH EVERY DAY   PARoxetine 20 MG tablet Commonly known as: Paxil Take 0.5 tablets (10 mg total) by mouth daily.   rizatriptan 5 MG tablet Commonly known as: MAXALT Take 1 tablet (5 mg total) by mouth as needed for migraine. May repeat in 2 hours if needed       All past medical history, surgical history, allergies,  family history, immunizations andmedications were updated in the EMR today and reviewed under the history and medication portions of their EMR.     ROS: Negative, with the exception of above mentioned in HPI   Objective:  BP 110/75 (BP Location: Right Arm, Patient Position: Sitting, Cuff Size: Normal)   Pulse 74   Temp (!) 97.4 F (36.3 C) (Temporal)   Resp 16   Ht 5\' 5"  (1.651 m)   Wt 183 lb 6 oz (83.2 kg)   LMP 01/31/2007   SpO2 97%   BMI 30.52 kg/m  Body mass index is 30.52 kg/m. Gen: Afebrile. No acute distress. Nontoxic in appearance, well developed, well nourished.  Very pleasant, Caucasian obese female. HENT: AT. [place].  Eyes:Pupils Equal Round Reactive to light, Extraocular movements intact,  Conjunctiva without redness, discharge or icterus. Neck/lymp/endocrine: Supple, no lymphadenopathy, no thyromegaly CV: RRR no murmur, no edema Chest: CTAB, no wheeze or crackles. Good air movement, normal resp effort.  Abd: Soft. NTND. BS present.  Skin: [email or web address]. Neuro:  Normal gait. PERLA. EOMi. Alert. Oriented x3 Psych: Normal affect, dress and demeanor. Normal speech. Normal thought content and judgment.  No exam data present No results found. Results for orders placed or performed in visit on 02/07/19 (from the past 24 hour(s))  POCT Urinalysis Dipstick     Status: Abnormal   Collection Time: 02/07/19 10:31 AM  Result Value Ref Range   Color, UA yellow    Clarity, UA clear    Glucose, UA Negative Negative    Bilirubin, UA negative    Ketones, UA negative    Spec Grav, UA 1.015 1.010 - 1.025   Blood, UA negative    pH, UA 6.0 5.0 - 8.0   Protein, UA Negative Negative   Urobilinogen, UA 0.2 0.2 or 1.0 E.U./dL   Nitrite, UA negative    Leukocytes, UA 4+ (A) Negative   Appearance     Odor      Assessment/Plan: Ahliyah Frothingham is a 59 y.o. female present for OV for  Frequent urination/abnormal urine New problem. Patient's urine today with 4+ leukocytes.  Will send for urine culture and then treat appropriately if culture is positive with antibiotics. -No glucose present in urine. - POCT Urinalysis Dipstick - Urine Culture  Increased thirst New problem. Uncertain etiology of her increased thirst.  Will rule out diabetes or electrolyte disorders as cause today.  Patient will be called with lab results. - Basic Metabolic Panel (BMET) - Hemoglobin A1c  Hypothyroidism, unspecified type Chronic problem uncontrolled Discussed with her that there is no need to retest her thyroid today it will be abnormal since she has not taken her medications.  Refilled her levothyroxine 112 mcg for her today and will follow up in 6-8 weeks for testing. -Uncontrolled hypothyroidism will also contribute to her weight gain. - levothyroxine (SYNTHROID) 112 MCG tablet; Take 1 tablet (112 mcg total) by mouth daily.  Dispense: 90 tablet; Refill: 0  Weight loss counseling: New problem. Discussed options with her today on weight loss counseling.  She would like to try medication if her insurance will cover.  Contrave prescribed for her today. Discussed weight loss clinic with Dr. Juleen China.  She would like to wait and see how she does with medications and discuss at her CPE in a few weeks. -Advised her her hypothyroidism will cause her to gain weight and have difficulty losing weight.  Compliance with her levothyroxine is needed if she wants to be successful in her weight loss.  Ear pain: New problem. Reassurance  provided.  Her ears appear normal.  Very little cerumen debris in the external auditory canal.   Reviewed expectations re: course of current medical issues.  Discussed self-management of symptoms.  Outlined signs and symptoms indicating need for more acute intervention.  Patient verbalized understanding and all questions were answered.  Patient received an After-Visit Summary.    Orders Placed This Encounter  Procedures  . POCT Urinalysis Dipstick     Note is dictated utilizing voice recognition software. Although note has been proof read prior to signing, occasional typographical errors still can be missed. If any questions arise, please do not hesitate to call for verification.   electronically signed by:  Howard Pouch, DO  [place]

## 2019-02-08 LAB — BASIC METABOLIC PANEL
BUN: 10 mg/dL (ref 7–25)
CO2: 25 mmol/L (ref 20–32)
Calcium: 9.4 mg/dL (ref 8.6–10.4)
Chloride: 106 mmol/L (ref 98–110)
Creat: 0.83 mg/dL (ref 0.50–1.05)
Glucose, Bld: 95 mg/dL (ref 65–99)
Potassium: 4.8 mmol/L (ref 3.5–5.3)
Sodium: 141 mmol/L (ref 135–146)

## 2019-02-08 LAB — URINE CULTURE
MICRO NUMBER:: 10022762
SPECIMEN QUALITY:: ADEQUATE

## 2019-02-08 LAB — HEMOGLOBIN A1C
Hgb A1c MFr Bld: 5.5 % of total Hgb (ref <5.7)
Mean Plasma Glucose: 111 (calc)
eAG (mmol/L): 6.2 (calc)

## 2019-02-10 ENCOUNTER — Encounter: Payer: Self-pay | Admitting: Family Medicine

## 2019-02-10 ENCOUNTER — Ambulatory Visit: Payer: BC Managed Care – PPO | Attending: Internal Medicine

## 2019-02-10 ENCOUNTER — Telehealth: Payer: Self-pay | Admitting: Family Medicine

## 2019-02-10 DIAGNOSIS — Z20822 Contact with and (suspected) exposure to covid-19: Secondary | ICD-10-CM

## 2019-02-10 MED ORDER — LEVOFLOXACIN 250 MG PO TABS: 250.0000 mg | ORAL_TABLET | Freq: Every day | ORAL | 0 refills | Status: DC

## 2019-02-10 NOTE — Telephone Encounter (Signed)
Called patient left vm for patient to call the office back

## 2019-02-10 NOTE — Telephone Encounter (Signed)
Her kidney function is normal. Her diabetes screen and glucose are normal-no signs of diabetes. Her urine culture did show evidence of a strep infection.  I have called in an antibiotic that she will take once daily for 3 days to treat.

## 2019-02-11 ENCOUNTER — Telehealth: Payer: Self-pay

## 2019-02-11 ENCOUNTER — Other Ambulatory Visit: Payer: BC Managed Care – PPO

## 2019-02-11 LAB — NOVEL CORONAVIRUS, NAA: SARS-CoV-2, NAA: NOT DETECTED

## 2019-02-11 MED ORDER — BUPROPION HCL ER (SR) 150 MG PO TB12: ORAL_TABLET | ORAL | 2 refills | Status: DC

## 2019-02-11 NOTE — Addendum Note (Signed)
Addended by: Howard Pouch A on: 02/11/2019 04:27 PM   Modules accepted: Orders

## 2019-02-11 NOTE — Telephone Encounter (Signed)
Pt was called and given information. She verbalized understanding.

## 2019-02-11 NOTE — Telephone Encounter (Signed)
Noted. Please inform patient many that his medications are not covered by insurance. Attempted to call in the medication Contrave which is part Wellbutrin in part another medication.   Wellbutrin alone is usually covered. Also remind her that she may see some benefit with decreasing her weight once we are able to get her thyroid back at normal levels.  Hypothyroidism dose caused weight gain and makes it difficult for weight loss.  I have called in the Wellbutrin alone for her.  And we can discuss everything in more detail at her follow-up/CPE to be scheduled in 6-8 weeks.

## 2019-02-11 NOTE — Telephone Encounter (Signed)
Pharmacy was called and Merrilee Seashore, pharmacist, said that medication was not covered and very expensive but they did receive it. PA usually cannot be done and approved for this medication per Merrilee Seashore. He is unable to start PA because of this. Called pt to let her know information. She is asking if there is a medication that is covered. Pt advised most of these medications are not covered by insurance, weight loss clinic was the other option mentioned by Dr Raoul Pitch. Pt states she is not working right now and probably cannot afford the weight loss clinic.

## 2019-02-11 NOTE — Telephone Encounter (Signed)
Patient is calling to check on getting an Rx for an appetite suppressant.

## 2019-03-17 ENCOUNTER — Other Ambulatory Visit: Payer: BC Managed Care – PPO

## 2019-03-19 ENCOUNTER — Ambulatory Visit: Payer: BC Managed Care – PPO | Attending: Internal Medicine

## 2019-03-19 DIAGNOSIS — Z20822 Contact with and (suspected) exposure to covid-19: Secondary | ICD-10-CM

## 2019-03-20 LAB — NOVEL CORONAVIRUS, NAA: SARS-CoV-2, NAA: NOT DETECTED

## 2019-04-14 ENCOUNTER — Other Ambulatory Visit: Payer: Self-pay

## 2019-04-14 MED ORDER — CLONAZEPAM 0.5 MG PO TABS: ORAL_TABLET | ORAL | 0 refills | Status: DC

## 2019-04-21 ENCOUNTER — Encounter: Payer: Self-pay | Admitting: Certified Nurse Midwife

## 2019-05-14 ENCOUNTER — Ambulatory Visit: Payer: Self-pay | Admitting: Obstetrics and Gynecology

## 2019-05-21 ENCOUNTER — Encounter: Payer: Self-pay | Admitting: Family Medicine

## 2019-05-21 ENCOUNTER — Ambulatory Visit (INDEPENDENT_AMBULATORY_CARE_PROVIDER_SITE_OTHER): Payer: BC Managed Care – PPO | Admitting: Family Medicine

## 2019-05-21 ENCOUNTER — Other Ambulatory Visit: Payer: Self-pay

## 2019-05-21 VITALS — BP 120/88 | HR 99 | Temp 97.3°F | Resp 18 | Ht 65.0 in | Wt 185.4 lb

## 2019-05-21 DIAGNOSIS — G43909 Migraine, unspecified, not intractable, without status migrainosus: Secondary | ICD-10-CM

## 2019-05-21 DIAGNOSIS — J4520 Mild intermittent asthma, uncomplicated: Secondary | ICD-10-CM

## 2019-05-21 DIAGNOSIS — E559 Vitamin D deficiency, unspecified: Secondary | ICD-10-CM

## 2019-05-21 DIAGNOSIS — E039 Hypothyroidism, unspecified: Secondary | ICD-10-CM

## 2019-05-21 DIAGNOSIS — E669 Obesity, unspecified: Secondary | ICD-10-CM

## 2019-05-21 DIAGNOSIS — E782 Mixed hyperlipidemia: Secondary | ICD-10-CM

## 2019-05-21 DIAGNOSIS — R05 Cough: Secondary | ICD-10-CM

## 2019-05-21 DIAGNOSIS — Z0000 Encounter for general adult medical examination without abnormal findings: Secondary | ICD-10-CM

## 2019-05-21 DIAGNOSIS — R062 Wheezing: Secondary | ICD-10-CM

## 2019-05-21 DIAGNOSIS — R059 Cough, unspecified: Secondary | ICD-10-CM

## 2019-05-21 MED ORDER — ALBUTEROL SULFATE HFA 108 (90 BASE) MCG/ACT IN AERS: 2.0000 | INHALATION_SPRAY | Freq: Four times a day (QID) | RESPIRATORY_TRACT | 5 refills | Status: DC | PRN

## 2019-05-21 MED ORDER — RIZATRIPTAN BENZOATE 5 MG PO TABS: 5.0000 mg | ORAL_TABLET | ORAL | 5 refills | Status: DC | PRN

## 2019-05-21 NOTE — Patient Instructions (Addendum)
I will refill your thyroid medication tomorrow after I get the result.  I refilled your migraine medication as well.  shingrix in 4 weeks by nurse appt- with 2nd shot 3 mos after.   Health Maintenance, Female Adopting a healthy lifestyle and getting preventive care are important in promoting health and wellness. Ask your health care provider about:  The right schedule for you to have regular tests and exams.  Things you can do on your own to prevent diseases and keep yourself healthy. What should I know about diet, weight, and exercise? Eat a healthy diet   Eat a diet that includes plenty of vegetables, fruits, low-fat dairy products, and lean protein.  Do not eat a lot of foods that are high in solid fats, added sugars, or sodium. Maintain a healthy weight Body mass index (BMI) is used to identify weight problems. It estimates body fat based on height and weight. Your health care provider can help determine your BMI and help you achieve or maintain a healthy weight. Get regular exercise Get regular exercise. This is one of the most important things you can do for your health. Most adults should:  Exercise for at least 150 minutes each week. The exercise should increase your heart rate and make you sweat (moderate-intensity exercise).  Do strengthening exercises at least twice a week. This is in addition to the moderate-intensity exercise.  Spend less time sitting. Even light physical activity can be beneficial. Watch cholesterol and blood lipids Have your blood tested for lipids and cholesterol at 57 years of age, then have this test every 5 years. Have your cholesterol levels checked more often if:  Your lipid or cholesterol levels are high.  You are older than 57 years of age.  You are at high risk for heart disease. What should I know about cancer screening? Depending on your health history and family history, you may need to have cancer screening at various ages. This may  include screening for:  Breast cancer.  Cervical cancer.  Colorectal cancer.  Skin cancer.  Lung cancer. What should I know about heart disease, diabetes, and high blood pressure? Blood pressure and heart disease  High blood pressure causes heart disease and increases the risk of stroke. This is more likely to develop in people who have high blood pressure readings, are of African descent, or are overweight.  Have your blood pressure checked: ? Every 3-5 years if you are 21-74 years of age. ? Every year if you are 106 years old or older. Diabetes Have regular diabetes screenings. This checks your fasting blood sugar level. Have the screening done:  Once every three years after age 76 if you are at a normal weight and have a low risk for diabetes.  More often and at a younger age if you are overweight or have a high risk for diabetes. What should I know about preventing infection? Hepatitis B If you have a higher risk for hepatitis B, you should be screened for this virus. Talk with your health care provider to find out if you are at risk for hepatitis B infection. Hepatitis C Testing is recommended for:  Everyone born from [birth year] through 1963.  Anyone with known risk factors for hepatitis C. Sexually transmitted infections (STIs)  Get screened for STIs, including gonorrhea and chlamydia, if: ? You are sexually active and are younger than 57 years of age. ? You are older than 57 years of age and your health care provider tells you that  you are at risk for this type of infection. ? Your sexual activity has changed since you were last screened, and you are at increased risk for chlamydia or gonorrhea. Ask your health care provider if you are at risk.  Ask your health care provider about whether you are at high risk for HIV. Your health care provider may recommend a prescription medicine to help prevent HIV infection. If you choose to take medicine to prevent HIV, you should first  get tested for HIV. You should then be tested every 3 months for as long as you are taking the medicine. Pregnancy  If you are about to stop having your period (premenopausal) and you may become pregnant, seek counseling before you get pregnant.  Take 400 to 800 micrograms (mcg) of folic acid every day if you become pregnant.  Ask for birth control (contraception) if you want to prevent pregnancy. Osteoporosis and menopause Osteoporosis is a disease in which the bones lose minerals and strength with aging. This can result in bone fractures. If you are 38 years old or older, or if you are at risk for osteoporosis and fractures, ask your health care provider if you should:  Be screened for bone loss.  Take a calcium or vitamin D supplement to lower your risk of fractures.  Be given hormone replacement therapy (HRT) to treat symptoms of menopause. Follow these instructions at home: Lifestyle  Do not use any products that contain nicotine or tobacco, such as cigarettes, e-cigarettes, and chewing tobacco. If you need help quitting, ask your health care provider.  Do not use street drugs.  Do not share needles.  Ask your health care provider for help if you need support or information about quitting drugs. Alcohol use  Do not drink alcohol if: ? Your health care provider tells you not to drink. ? You are pregnant, may be pregnant, or are planning to become pregnant.  If you drink alcohol: ? Limit how much you use to 0-1 drink a day. ? Limit intake if you are breastfeeding.  Be aware of how much alcohol is in your drink. In the [place] one drink equals one 12 oz bottle of beer (355 mL), one 5 oz glass of wine (148 mL), or one 1 oz glass of hard liquor (44 mL). General instructions  Schedule regular health, dental, and eye exams.  Stay current with your vaccines.  Tell your health care provider if: ? You often feel depressed. ? You have ever been abused or do not feel safe at  home. Summary  Adopting a healthy lifestyle and getting preventive care are important in promoting health and wellness.  Follow your health care provider's instructions about healthy diet, exercising, and getting tested or screened for diseases.  Follow your health care provider's instructions on monitoring your cholesterol and blood pressure. This information is not intended to replace advice given to you by your health care provider. Make sure you discuss any questions you have with your health care provider. Document Revised: 01/09/2018 Document Reviewed: 01/09/2018 Elsevier Patient Education  2020 [employer].

## 2019-05-21 NOTE — Progress Notes (Signed)
This visit occurred during the SARS-CoV-2 public health emergency.  Safety protocols were in place, including screening questions prior to the visit, additional usage of staff PPE, and extensive cleaning of exam room while observing appropriate contact time as indicated for disinfecting solutions.    Patient ID: Tiffany Horn, female  DOB: October 09, 1960, 59 y.o.   MRN: [id number] Patient Care Team    Relationship Specialty Notifications Start End  [name], DO PCP - General Family Medicine  08/18/14   [name], MD Consulting Physician Gastroenterology  05/29/16   [name], CNM Referring Physician Certified Nurse Midwife  05/29/16   Gari Crown, MD Referring Physician Obstetrics and Gynecology  07/17/17     Chief Complaint  Patient presents with  . Annual Exam    Pt states not fasting. PHQ-9 score 13    Subjective:  Tiffany Horn is a 59 y.o.  Female  present for CPE. All past medical history, surgical history, allergies, family history, immunizations, medications and social history were updated in the electronic medical record today. All recent labs, ED visits and hospitalizations within the last year were reviewed.  Health maintenance:  Colonoscopy: completed 03/2015, by Dr. Samul Dada, resutls 5 year recall for family history. Mammogram: completed: 01/2018, normal. Scheduled in May.  Cervical cancer screening: last pap: 01/2018, results: Dr. Quincy Simmonds Immunizations: tdap UTD 01/2014, Influenza UTD 2020 (encouraged yearly). COVID moderna series completed. Shingrix by nurse appt 4 weeks after Moderna. Infectious disease screening: HIV completed, Hep C completed DEXA: N/A Assistive device: none Oxygen YKD:XIPJ Patient has a Dental home. Hospitalizations/ED visits: reviewed  Hypothyroidism: Patient has a history of hyperthyroidism.  She has been noncompliant with medications in the past.  She states she has been taking her thyroid medicine daily since out last appt  3 months ago.   Migraine: Doing well on with maxalt.  .    Depression screen [facility] 2/9 05/21/2019 03/06/2017 09/29/2016 05/29/2016 09/01/2014  Decreased Interest 3 2 1  0 3  Down, Depressed, Hopeless 3 2 3  0 3  PHQ - 2 Score 6 4 4  0 6  Altered sleeping 1 2 0 - 3  Tired, decreased energy 3 3 3  - 3  Change in appetite 1 0 0 - 3  Feeling bad or failure about yourself  0 0 0 - 3  Trouble concentrating 0 1 2 - 3  Moving slowly or fidgety/restless 2 0 2 - 0  Suicidal thoughts 0 0 0 - 0  PHQ-9 Score 13 10 11  - 21  Difficult doing work/chores Somewhat difficult - - - -   GAD 7 : Generalized Anxiety Score 03/06/2017 09/29/2016  Nervous, Anxious, on Edge 3 3  Control/stop worrying 2 1  Worry too much - different things 2 0  Trouble relaxing 3 3  Restless 0 3  Easily annoyed or irritable 0 3  Afraid - awful might happen 2 3  Total GAD 7 Score 12 16  Anxiety Difficulty - Very difficult     Immunization History  Administered Date(s) Administered  . Influenza Inj Mdck Quad Pf 10/26/2018  . Influenza,inj,Quad PF,6+ Mos 10/19/2014, 10/01/2018  . Influenza,inj,quad, With Preservative 11/03/2016, 11/18/2017  . Influenza-Unspecified 11/02/2016  . Moderna SARS-COVID-2 Vaccination 04/07/2019, 05/13/2019  . Pneumococcal Conjugate-13 11/18/2017  . Tdap 01/30/2014     Past Medical History:  Diagnosis Date  . Anxiety   . Arthritis    knees and spine, shoulder  . Asthma   . Bronchitis    hx - recurrent  .  Complication of anesthesia    waking up is not easy  . COVID-19   . Depression   . Elevated IgE level 09/12/2017   09/12/2017 IgE 195  . H/O miscarriage, not currently pregnant   . Hx of irritable bowel syndrome    x2  . Hyperlipidemia    diet controlled - no medication  . Hypertension    not taking any meds at present - under control per patient  . Hypokalemia    with PNA admission (2.5)  . Migraines   . Pneumonia    4 episodes; hosp. admission 2014  . PONV (postoperative nausea and  vomiting)   . Thyroid disease    Allergies  Allergen Reactions  . Amoxicillin Diarrhea  . Seroquel [Quetiapine Fumarate]     Unknown   Past Surgical History:  Procedure Laterality Date  . BREAST SURGERY     implants, then had them removed  . COLONOSCOPY     greater 10 yrs ago - ? [place]    . LAPAROSCOPIC ABDOMINAL EXPLORATION  1994   endometriosis  . ORIF HUMERUS FRACTURE Left 04/01/2013   DR [facility] - shoulder  . ORIF HUMERUS FRACTURE Left 04/01/2013   Procedure: OPEN REDUCTION INTERNAL FIXATION (ORIF) LEFT PROXIMAL HUMERUS FRACTURE;  Surgeon: Mcarthur Rossetti, MD;  Location: [place];  Service: Orthopedics;  Laterality: Left;  . TONSILLECTOMY AND ADENOIDECTOMY     Family History  Problem Relation Age of Onset  . Breast cancer Mother   . Diabetes Mother   . Heart disease Mother   . Diabetes Father   . Dementia Father   . Colon cancer Father   . Stroke Paternal Grandmother   . Diabetes Paternal Grandmother    Social History   Social History Narrative   She is originally from [place]. She has traveled to [facility] [place], [place], [place], [place], [place], [place], [place], [place], [place]. No international travel. She has dogs. No prior bird, mold, or recent hot tub exposure. She hasn't used her hot tub in 1.5 years. 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 Previously enjoyed gardening and playing tennis. Helps to care for her mother.    Allergies as of 05/21/2019      Reactions   Amoxicillin Diarrhea   Seroquel [quetiapine Fumarate]    Unknown      Medication List       Accurate as of May 21, 2019  2:46 PM. If you have any questions, ask your nurse or doctor.        STOP taking these medications   buPROPion 150 MG 12 hr tablet Commonly known as: Wellbutrin SR Stopped by: Howard Pouch, DO   levofloxacin 250 MG tablet Commonly known as: LEVAQUIN Stopped by: Howard Pouch, DO     TAKE these  medications   AeroChamber MV inhaler Use as instructed   albuterol 108 (90 Base) MCG/ACT inhaler Commonly known as: VENTOLIN HFA Inhale 2 puffs into the lungs every 6 (six) hours as needed for wheezing or shortness of breath.   clonazePAM 0.5 MG tablet Commonly known as: KLONOPIN TAKE 1 TABLET BY MOUTH THREE TIMES A DAY AS NEEDED FOR ANXIETY   levothyroxine 112 MCG tablet Commonly known as: SYNTHROID Take 1 tablet (112 mcg total) by mouth daily.   montelukast 10 MG tablet Commonly known as: SINGULAIR Take 1 tablet (10 mg total) by mouth at bedtime.   PARoxetine  40 MG tablet Commonly known as: PAXIL TAKE 1 TABLET BY MOUTH EVERY DAY   PARoxetine 20 MG tablet Commonly known as: Paxil Take 0.5 tablets (10 mg total) by mouth daily.   rizatriptan 5 MG tablet Commonly known as: MAXALT Take 1 tablet (5 mg total) by mouth as needed for migraine. May repeat in 2 hours if needed       All past medical history, surgical history, allergies, family history, immunizations andmedications were updated in the EMR today and reviewed under the history and medication portions of their EMR.      ROS: 14 pt review of systems performed and negative (unless mentioned in an HPI)  Objective: BP 120/88 (BP Location: Right Arm, Patient Position: Sitting, Cuff Size: Normal)   Pulse 99   Temp (!) 97.3 F (36.3 C) (Temporal)   Resp 18   Ht 5' 5"  (1.651 m)   Wt 185 lb 6.4 oz (84.1 kg)   LMP 01/31/2007   SpO2 96%   BMI 30.85 kg/m  Gen: Afebrile. No acute distress. Nontoxic in appearance, well-developed, well-nourished,  Obese female.  HENT: AT. [place]. Bilateral TM visualized and normal in appearance, normal external auditory canal. MMM, no oral lesions, adequate dentition. Bilateral nares with mild swelling is present with mild drainage Throat without erythema, ulcerations or exudates. no Cough on exam, no hoarseness on exam. Eyes:Pupils Equal Round Reactive to light, Extraocular movements intact,   Conjunctiva without redness, discharge or icterus. Neck/lymp/endocrine: Supple,no lymphadenopathy, no thyromegaly CV: RRR no murmur, no edema, +2/4 P posterior tibialis pulses. no carotid bruits. No JVD. Chest: CTAB, no wheeze, rhonchi or crackles. normal Respiratory effort. good Air movement. Abd: Soft. obese. NTND. BS present. no Masses palpated. No hepatosplenomegaly. No rebound tenderness or guarding. Skin: no rashes, purpura or petechiae. Warm and well-perfused. Skin intact. Neuro/Msk:  Normal gait. PERLA. EOMi. Alert. Oriented x3.  Cranial nerves II through XII intact. Muscle strength 5/5 upper/lower extremity. DTRs equal bilaterally. Psych: Normal affect, dress and demeanor. Normal speech. Normal thought content and judgment.   No exam data present  Assessment/plan: Tiffany Horn is a 59 y.o. female present for CPE Hypothyroidism, unspecified type Continue levothyrox 112 mcg QD. Refills will be provided in appropriate dose after results received.  - TSH Migraine without status migrainosus, not intractable, unspecified migraine type Continue Maxalt as needed.  Mixed hyperlipidemia/obesity Diet and exercise encouraged.  - CBC - Comp Met (CMET) - Lipid panel Vitamin D deficiency - Vitamin D (25 hydroxy) Encounter for preventive health examination Patient was encouraged to exercise greater than 150 minutes a week. Patient was encouraged to choose a diet filled with fresh fruits and vegetables, and lean meats. AVS provided to patient today for education/recommendation on gender specific health and safety maintenance.  Return in about 1 year (around 05/20/2020) for CPE (30 min).  Orders Placed This Encounter  Procedures  . CBC  . Comp Met (CMET)  . TSH  . Vitamin D (25 hydroxy)  . Lipid panel    Meds ordered this encounter  Medications  . rizatriptan (MAXALT) 5 MG tablet    Sig: Take 1 tablet (5 mg total) by mouth as needed for migraine. May repeat in 2 hours if needed     Dispense:  10 tablet    Refill:  5  . albuterol (VENTOLIN HFA) 108 (90 Base) MCG/ACT inhaler    Sig: Inhale 2 puffs into the lungs every 6 (six) hours as needed for wheezing or shortness of breath.  Dispense:  6.7 g    Refill:  5   Referral Orders  No referral(s) requested today     Electronically signed by: Howard Pouch, [place]

## 2019-05-22 ENCOUNTER — Telehealth: Payer: Self-pay | Admitting: Family Medicine

## 2019-05-22 ENCOUNTER — Other Ambulatory Visit: Payer: Self-pay | Admitting: Physician Assistant

## 2019-05-22 ENCOUNTER — Telehealth: Payer: Self-pay | Admitting: Physician Assistant

## 2019-05-22 DIAGNOSIS — E039 Hypothyroidism, unspecified: Secondary | ICD-10-CM

## 2019-05-22 DIAGNOSIS — R7989 Other specified abnormal findings of blood chemistry: Secondary | ICD-10-CM

## 2019-05-22 LAB — COMPREHENSIVE METABOLIC PANEL
AG Ratio: 1.6 (calc) (ref 1.0–2.5)
ALT: 44 U/L — ABNORMAL HIGH (ref 6–29)
AST: 29 U/L (ref 10–35)
Albumin: 4.2 g/dL (ref 3.6–5.1)
Alkaline phosphatase (APISO): 68 U/L (ref 37–153)
BUN: 8 mg/dL (ref 7–25)
CO2: 23 mmol/L (ref 20–32)
Calcium: 9.6 mg/dL (ref 8.6–10.4)
Chloride: 106 mmol/L (ref 98–110)
Creat: 0.83 mg/dL (ref 0.50–1.05)
Globulin: 2.7 g/dL (calc) (ref 1.9–3.7)
Glucose, Bld: 130 mg/dL — ABNORMAL HIGH (ref 65–99)
Potassium: 3.9 mmol/L (ref 3.5–5.3)
Sodium: 140 mmol/L (ref 135–146)
Total Bilirubin: 0.4 mg/dL (ref 0.2–1.2)
Total Protein: 6.9 g/dL (ref 6.1–8.1)

## 2019-05-22 LAB — CBC
HCT: 44.8 % (ref 35.0–45.0)
Hemoglobin: 14.8 g/dL (ref 11.7–15.5)
MCH: 30 pg (ref 27.0–33.0)
MCHC: 33 g/dL (ref 32.0–36.0)
MCV: 90.7 fL (ref 80.0–100.0)
MPV: 11.2 fL (ref 7.5–12.5)
Platelets: 264 10*3/uL (ref 140–400)
RBC: 4.94 10*6/uL (ref 3.80–5.10)
RDW: 12.5 % (ref 11.0–15.0)
WBC: 5.7 10*3/uL (ref 3.8–10.8)

## 2019-05-22 LAB — LIPID PANEL
Cholesterol: 271 mg/dL — ABNORMAL HIGH (ref <200)
HDL: 47 mg/dL — ABNORMAL LOW (ref >=50)
LDL Cholesterol (Calc): 172 mg/dL (calc) — ABNORMAL HIGH
Non-HDL Cholesterol (Calc): 224 mg/dL (calc) — ABNORMAL HIGH (ref <130)
Total CHOL/HDL Ratio: 5.8 (calc) — ABNORMAL HIGH (ref <5.0)
Triglycerides: 305 mg/dL — ABNORMAL HIGH (ref <150)

## 2019-05-22 LAB — TSH: TSH: 2.11 mIU/L (ref 0.40–4.50)

## 2019-05-22 LAB — VITAMIN D 25 HYDROXY (VIT D DEFICIENCY, FRACTURES): Vit D, 25-Hydroxy: 16 ng/mL — ABNORMAL LOW (ref 30–100)

## 2019-05-22 MED ORDER — ATORVASTATIN CALCIUM 20 MG PO TABS: 20.0000 mg | ORAL_TABLET | Freq: Every day | ORAL | 3 refills | Status: DC

## 2019-05-22 MED ORDER — CLONAZEPAM 0.5 MG PO TABS: ORAL_TABLET | ORAL | 0 refills | Status: DC

## 2019-05-22 MED ORDER — LEVOTHYROXINE SODIUM 112 MCG PO TABS: 112.0000 ug | ORAL_TABLET | Freq: Every day | ORAL | 3 refills | Status: DC

## 2019-05-22 MED ORDER — VITAMIN D (ERGOCALCIFEROL) 1.25 MG (50000 UNIT) PO CAPS: 50000.0000 [IU] | ORAL_CAPSULE | ORAL | 0 refills | Status: DC

## 2019-05-22 NOTE — Addendum Note (Signed)
Addended by: Howard Pouch A on: 05/22/2019 03:51 PM   Modules accepted: Orders

## 2019-05-22 NOTE — Telephone Encounter (Signed)
Pt was called and VM was left to return call  °

## 2019-05-22 NOTE — Telephone Encounter (Signed)
Patient called and left a message stating that she has an appointment on Friday but she is out of her klonopin and needs [facility]

## 2019-05-22 NOTE — Telephone Encounter (Signed)
Lipitor prescribed

## 2019-05-22 NOTE — Telephone Encounter (Signed)
Please inform patient the following information: -thyroid is functioning in normal range> I have refilled her thyroid medicine for her with enough refills for a year -Her blood cell counts are normal -Her vitamin D is severely low at 16> I have called in once weekly vitamin D supplementation that is high-dose.  I would encourage her to pick a day of the week, set a phone alarm so that she can remember to take her vitamin D.  High-dose once a week for 12 weeks.  She should also start an over-the-counter 1000 units of vitamin D daily (skip today she takes the high-dose),  which she will continue after the high-dose is completed to maintain levels. -Kidney function is normal, her electrolytes are normal, her liver function had a mild increase in one of the enzymes.  Again this was very mild and may be secondary to her high cholesterol.  However we will need to repeat the labs in a few weeks to see if returns to normal and if still elevated rule out other causes.>  Lab appointment only in 2 weeks for recheck on liver enzymes.  Avoid any alcohol consumption or Tylenol products if possible.  Order placed -Her cholesterol is extremely elevated with a total cholesterol 271 and a LDL/bad cholesterol of 172.  This does put her at a much higher risk of heart attack and stroke.  By criteria set by the American Heart Association she should start a daily statin to bring down her cholesterol.  Statins also provide added protection against heart attack and stroke.  She had been on Lipitor a few years ago.  We can restart that medication or if she stopped that medication due to a side effect there are other options.  Please ask her if she had a side effect to Lipitor or just stopped the medication?    I will call in a cholesterol-lowering medicine today after she answers the above question.  Follow-up in 3 months with provider and fasting labs, to recheck cholesterol and vitamin D levels.

## 2019-05-22 NOTE — Telephone Encounter (Signed)
Pt was called and given lab results/instructions, she verbalized understanding. She was working and will need to call back to schedule 2 wk lab appt and 3 month F/U appt

## 2019-05-22 NOTE — Telephone Encounter (Signed)
Patient called regarding cholesterol.  Patient states she did not have issues or side affects of Lipitor and agrees with plan to start again.  Previously on Lipitor 20mg  daily.

## 2019-05-22 NOTE — Telephone Encounter (Signed)
Prescription was sent in.

## 2019-05-23 ENCOUNTER — Telehealth (INDEPENDENT_AMBULATORY_CARE_PROVIDER_SITE_OTHER): Payer: BC Managed Care – PPO | Admitting: Physician Assistant

## 2019-05-23 ENCOUNTER — Encounter: Payer: Self-pay | Admitting: Physician Assistant

## 2019-05-23 DIAGNOSIS — F331 Major depressive disorder, recurrent, moderate: Secondary | ICD-10-CM | POA: Diagnosis not present

## 2019-05-23 DIAGNOSIS — F411 Generalized anxiety disorder: Secondary | ICD-10-CM

## 2019-05-23 DIAGNOSIS — F4321 Adjustment disorder with depressed mood: Secondary | ICD-10-CM

## 2019-05-23 NOTE — Progress Notes (Signed)
[facility] Med Check  Patient ID: Tiffany Horn,  MRN: [id number]  PCP: [name], DO  Date of Evaluation: 05/23/2019 Time spent:20 minutes  Chief Complaint:  Chief Complaint    Anxiety; Depression     Virtual Visit via Telephone Note  I connected with patient by a video enabled telemedicine application or telephone, with their informed consent, and verified patient privacy and that I am speaking with the correct person using two identifiers.  I am private, in my office and the patient is home.  I discussed the limitations, risks, security and privacy concerns of performing an evaluation and management service by telephone and the availability of in person appointments. I also discussed with the patient that there may be a patient responsible charge related to this service. The patient expressed understanding and agreed to proceed.   I discussed the assessment and treatment plan with the patient. The patient was provided an opportunity to ask questions and all were answered. The patient agreed with the plan and demonstrated an understanding of the instructions.   The patient was advised to call back or seek an in-person evaluation if the symptoms worsen or if the condition fails to improve as anticipated.  I provided 20 minutes of non-face-to-face time during this encounter.  HISTORY/CURRENT STATUS: HPI For routine med check.  Her Mom died 2019/04/01.  Has been very sad, is working full-time and getting her Mom's house ready to sell. She hasn't been taking care of herself very well d/t the stress.  Her Mom was on Hospice for several months.  Pt had a physical and labs recently and was told her Vit D level was really low and she was put on Vit. D weekly. Also has gained some weight. Her PCP is helping her work through those things.  Is very sad and cries easily.  Feels that it is to be expected grief.  Energy and motivation can be low at times.  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  Sometimes she sleeps well and sometimes she does not.  Denies suicidal or homicidal thoughts.  Continues to be anxious but it seems to be triggered.  The Klonopin does help.  Denies dizziness, syncope, seizures, numbness, tingling, tremor, tics, unsteady gait, slurred speech, confusion. Denies muscle or joint pain, stiffness, or dystonia.  Individual Medical History/ Review of Systems: Changes? :No     Past medications for mental health diagnoses include: 'everything'  Paxil works best.   Allergies: Amoxicillin and Seroquel [quetiapine fumarate]  Current Medications:  Current Outpatient Medications:  .  albuterol (VENTOLIN HFA) 108 (90 Base) MCG/ACT inhaler, Inhale 2 puffs into the lungs every 6 (six) hours as needed for wheezing or shortness of breath., Disp: 6.7 g, Rfl: 5 .  atorvastatin (LIPITOR) 20 MG tablet, Take 1 tablet (20 mg total) by mouth daily., Disp: 90 tablet, Rfl: 3 .  clonazePAM (KLONOPIN) 0.5 MG tablet, TAKE 1 TABLET BY MOUTH THREE TIMES A DAY AS NEEDED FOR ANXIETY, Disp: 45 tablet, Rfl: 0 .  levothyroxine (SYNTHROID) 112 MCG tablet, Take 1 tablet (112 mcg total) by mouth daily., Disp: 90 tablet, Rfl: 3 .  montelukast (SINGULAIR) 10 MG tablet, Take 1 tablet (10 mg total) by mouth at bedtime., Disp: 30 tablet, Rfl: 11 .  PARoxetine (PAXIL) 20 MG tablet, Take 0.5 tablets (10 mg total) by mouth daily. (Patient taking differently: Take 20 mg by mouth daily. ), Disp: 15 tablet, Rfl: 0 .  PARoxetine (PAXIL) 40 MG tablet, TAKE 1 TABLET BY MOUTH EVERY DAY, Disp: 90 tablet, Rfl: 1 .  rizatriptan (MAXALT) 5 MG tablet, Take 1 tablet (5 mg total) by mouth as needed for migraine. May repeat in 2 hours if needed, Disp: 10 tablet, Rfl: 5 .  Spacer/Aero-Holding Chambers (AEROCHAMBER MV) inhaler, Use as instructed, Disp: 1 each, Rfl: 2 .  Vitamin D, Ergocalciferol, (DRISDOL) 1.25 MG (50000 UNIT) CAPS capsule, Take 1 capsule (50,000 Units total) by  mouth every 7 (seven) days., Disp: 12 capsule, Rfl: 0 Medication Side Effects: none  Family Medical/ Social History: Changes? Yes Mom died in Mar 31, 2022.  [place]:  Last menstrual period 01/31/2007.There is no height or weight on file to calculate BMI.  General Appearance: unable to assess  Eye Contact:  unable to assess  Speech:  Clear and Coherent and Normal Rate  Volume:  Normal  Mood:  Depressed  Affect:  Tearful  Thought Process:  Goal Directed and Descriptions of Associations: Intact  Orientation:  Full (Time, Place, and Person)  Thought Content: Logical   Suicidal Thoughts:  No  Homicidal Thoughts:  No  Memory:  WNL  Judgement:  Good  Insight:  Good  Psychomotor Activity:  unable to assess  Concentration:  Concentration: Good  Recall:  Good  Fund of Knowledge: Good  Language: Good  Assets:  Desire for Improvement  ADL's:  Intact  Cognition: WNL  Prognosis:  Good    DIAGNOSES:    ICD-10-CM   1. Major depressive disorder, recurrent episode, moderate (HCC)  F33.1   2. Generalized anxiety disorder  F41.1   3. Grief  F43.21     Receiving Psychotherapy: Yes Through hospice.   RECOMMENDATIONS:  PDMP was reviewed. I spent 20 minutes with her. My condolences concerning the loss of her mother. We agreed to leave the Paxil at 60 mg and wait and see how she is in 6 weeks or so.  She completely understands that a lot of what she is going through is circumstantial caused by the heartache of losing her mom.  Counseling will help tremendously. Continue Paxil 20 mg +40 mg daily. Continue Klonopin 0.5 mg, 1 p.o. 3 times daily as needed. Continue therapy. Return in 6 weeks.  Donnal Moat, PA-C

## 2019-06-02 ENCOUNTER — Telehealth: Payer: Self-pay

## 2019-06-02 NOTE — Telephone Encounter (Signed)
Wants to know when she should come in for follow up visit with Dr. Raoul Pitch. Could not remember .  Please advise.  Please call patient [phone]

## 2019-06-03 NOTE — Telephone Encounter (Signed)
Pt was called and detailed message letting patient know she has lab appt scheduled to check kidney function and then will need a fasting 3 month F/U scheduled for Lipid/Vit D recheck since starting Lipitor.

## 2019-06-05 ENCOUNTER — Telehealth: Payer: Self-pay

## 2019-06-05 NOTE — Telephone Encounter (Signed)
Pt called and stated she is having back pain with frequent urination at night. She has had low grade fever of 99 degrees F with fatigue. Pt is asking to get scans of liver and kidneys. She was advised she would need to make appt with Dr Raoul Pitch to discuss symptoms and tx. Appt was made

## 2019-06-06 ENCOUNTER — Ambulatory Visit: Payer: BC Managed Care – PPO | Admitting: Family Medicine

## 2019-06-06 ENCOUNTER — Encounter: Payer: Self-pay | Admitting: Family Medicine

## 2019-06-06 ENCOUNTER — Other Ambulatory Visit: Payer: Self-pay

## 2019-06-06 VITALS — BP 117/87 | HR 81 | Temp 97.6°F | Resp 16 | Ht 65.0 in | Wt 184.1 lb

## 2019-06-06 DIAGNOSIS — R351 Nocturia: Secondary | ICD-10-CM | POA: Diagnosis not present

## 2019-06-06 DIAGNOSIS — R7989 Other specified abnormal findings of blood chemistry: Secondary | ICD-10-CM | POA: Insufficient documentation

## 2019-06-06 DIAGNOSIS — S29012A Strain of muscle and tendon of back wall of thorax, initial encounter: Secondary | ICD-10-CM

## 2019-06-06 DIAGNOSIS — R319 Hematuria, unspecified: Secondary | ICD-10-CM | POA: Insufficient documentation

## 2019-06-06 LAB — POCT URINALYSIS DIPSTICK
Bilirubin, UA: NEGATIVE
Blood, UA: 10
Glucose, UA: NEGATIVE
Ketones, UA: NEGATIVE
Leukocytes, UA: NEGATIVE
Nitrite, UA: NEGATIVE
Protein, UA: NEGATIVE
Spec Grav, UA: >=1.03 — AB (ref 1.010–1.025)
Urobilinogen, UA: 0.2 E.U./dL
pH, UA: 6 (ref 5.0–8.0)

## 2019-06-06 MED ORDER — CEFDINIR 300 MG PO CAPS: 300.0000 mg | ORAL_CAPSULE | Freq: Two times a day (BID) | ORAL | 0 refills | Status: DC

## 2019-06-06 MED ORDER — METHOCARBAMOL 500 MG PO TABS: 500.0000 mg | ORAL_TABLET | Freq: Four times a day (QID) | ORAL | 0 refills | Status: DC | PRN

## 2019-06-06 NOTE — Progress Notes (Signed)
This visit occurred during the SARS-CoV-2 public health emergency.  Safety protocols were in place, including screening questions prior to the visit, additional usage of staff PPE, and extensive cleaning of exam room while observing appropriate contact time as indicated for disinfecting solutions.    Tiffany Horn , 10-Mar-1960, 59 y.o., female MRN: [id number] Patient Care Team    Relationship Specialty Notifications Start End  [name], DO PCP - General Family Medicine  08/18/14   [name], MD Consulting Physician Gastroenterology  05/29/16   [name], CNM Referring Physician Certified Nurse Midwife  05/29/16   Gari Crown, MD Referring Physician Obstetrics and Gynecology  07/17/17     Chief Complaint  Patient presents with  . Nocturia    x2 days. No blood in urine. No odor.   . Back Pain     Subjective: Tiffany Horn is a 59 y.o. female present with urinary frequency.  She reports she has noticed over the last 2 nights she has been getting up in the middle the night on multiple occasions to urinate.  She denies any fever, chills, nausea, vomit or blood in her her urine.  She does not have a history of kidney stones.  She does endorse having mild right-sided thoracic back pain.  She denies any injury.  Pain is worse with movement.  It can be sharp in nature. Of note patient also had elevated ALT approximately 1 month ago.     Depression screen [facility] 2/9 05/21/2019 03/06/2017 09/29/2016 05/29/2016 09/01/2014  Decreased Interest _0 0 3  Down, Depressed, Hopeless _1 0 3  PHQ - 2 Score _2 0 6  Altered sleeping 1 2 0 - 3  Tired, decreased energy _3 - 3  Change in appetite 1 0 0 - 3  Feeling bad or failure about yourself  0 0 0 - 3  Trouble concentrating 0 1 2 - 3  Moving slowly or fidgety/restless 2 0 2 - 0  Suicidal thoughts 0 0 0 - 0  PHQ-9 Score _4 - 21  Difficult doing work/chores Somewhat difficult - - - -    Allergies  Allergen  Reactions  . Amoxicillin Diarrhea  . Seroquel [Quetiapine Fumarate]     Unknown   Social History   Social History Narrative   She is originally from [place]. She has traveled to [facility], [place], [place], [place], [place], [place], [place], [place], [place]. No international travel. She has dogs. No prior bird, mold, or recent hot tub exposure. She hasn't used her hot tub in 1.5 years. 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 Previously enjoyed gardening and playing tennis. Helps to care for her mother.   Past Medical History:  Diagnosis Date  . Anxiety   . Arthritis    knees and spine, shoulder  . Asthma   . Bronchitis    hx - recurrent  . Complication of anesthesia    waking up is not easy  . COVID-19   . Depression   . Elevated IgE level 09/12/2017   09/12/2017 IgE 195  . H/O miscarriage, not currently pregnant   . Hx of irritable bowel syndrome    x2  . Hyperlipidemia    diet controlled - no medication  . Hypertension    not taking any meds at present - under control per patient  . Hypokalemia  with PNA admission (2.5)  . Migraines   . Pneumonia    4 episodes; hosp. admission 2014  . PONV (postoperative nausea and vomiting)   . Thyroid disease    Past Surgical History:  Procedure Laterality Date  . BREAST SURGERY     implants, then had them removed  . COLONOSCOPY     greater 10 yrs ago - ? [place]    . LAPAROSCOPIC ABDOMINAL EXPLORATION  1994   endometriosis  . ORIF HUMERUS FRACTURE Left 04/01/2013   DR [facility] - shoulder  . ORIF HUMERUS FRACTURE Left 04/01/2013   Procedure: OPEN REDUCTION INTERNAL FIXATION (ORIF) LEFT PROXIMAL HUMERUS FRACTURE;  Surgeon: Mcarthur Rossetti, MD;  Location: [place];  Service: Orthopedics;  Laterality: Left;  . TONSILLECTOMY AND ADENOIDECTOMY     Family History  Problem Relation Age of Onset  . Breast cancer Mother   . Diabetes Mother   . Heart disease Mother    . Diabetes Father   . Dementia Father   . Colon cancer Father   . Stroke Paternal Grandmother   . Diabetes Paternal Grandmother    Allergies as of 06/06/2019      Reactions   Amoxicillin Diarrhea   Seroquel [quetiapine Fumarate]    Unknown      Medication List       Accurate as of Jun 06, 2019  2:12 PM. If you have any questions, ask your nurse or doctor.        AeroChamber MV inhaler Use as instructed   albuterol 108 (90 Base) MCG/ACT inhaler Commonly known as: VENTOLIN HFA Inhale 2 puffs into the lungs every 6 (six) hours as needed for wheezing or shortness of breath.   atorvastatin 20 MG tablet Commonly known as: LIPITOR Take 1 tablet (20 mg total) by mouth daily.   clonazePAM 0.5 MG tablet Commonly known as: KLONOPIN TAKE 1 TABLET BY MOUTH THREE TIMES A DAY AS NEEDED FOR ANXIETY   levothyroxine 112 MCG tablet Commonly known as: SYNTHROID Take 1 tablet (112 mcg total) by mouth daily.   montelukast 10 MG tablet Commonly known as: SINGULAIR Take 1 tablet (10 mg total) by mouth at bedtime.   PARoxetine 40 MG tablet Commonly known as: PAXIL TAKE 1 TABLET BY MOUTH EVERY DAY What changed: Another medication with the same name was changed. Make sure you understand how and when to take each.   PARoxetine 20 MG tablet Commonly known as: Paxil Take 0.5 tablets (10 mg total) by mouth daily. What changed: how much to take   rizatriptan 5 MG tablet Commonly known as: MAXALT Take 1 tablet (5 mg total) by mouth as needed for migraine. May repeat in 2 hours if needed   Vitamin D (Ergocalciferol) 1.25 MG (50000 UNIT) Caps capsule Commonly known as: DRISDOL Take 1 capsule (50,000 Units total) by mouth every 7 (seven) days.       All past medical history, surgical history, allergies, family history, immunizations andmedications were updated in the EMR today and reviewed under the history and medication portions of their EMR.     ROS: Negative, with the exception of  above mentioned in HPI   Objective:  BP 117/87 (BP Location: Left Arm, Patient Position: Sitting, Cuff Size: Normal)   Pulse 81   Temp 97.6 F (36.4 C) (Temporal)   Resp 16   Ht '5\' 5"'$  (1.651 m)   Wt 184 lb 2 oz (83.5 kg)   LMP 01/31/2007  SpO2 97%   BMI 30.64 kg/m  Body mass index is 30.64 kg/m. Gen: Afebrile. No acute distress.  Nontoxic in presentation.  Pleasant female HENT: AT. [place].  Eyes:Pupils Equal Round Reactive to light, Extraocular movements intact,  Conjunctiva without redness, discharge or icterus. CV: RRR  Chest: CTAB, no wheeze or crackles Abd: Soft.NTND. BS present.  No masses palpated.  MSK: No CVA tenderness bilaterally, mild tenderness to palpation right lower thoracic approximately level T6-T8 with muscle spasm over this area. Skin: no rashes, purpura or petechiae.  Neuro:  Normal gait. PERLA. EOMi. Alert. Oriented x3  Psych: Normal affect, dress and demeanor. Normal speech. Normal thought content and judgment.    No exam data present No results found. No results found for this or any previous visit (from the past 24 hour(s)).  Assessment/Plan: Joletta Manner is a 59 y.o. female present for OV for  Frequent urination at night/hematuria Possible kidney stone related.  No CVA tenderness very minimal hematuria on urinalysis. We will start Omnicef twice daily prophylactically while awaiting urine cultures. She was encouraged if symptoms worsen or pain worsens over the weekend to add NSAIDs to her course. - POCT urinalysis dipstick - Urine Culture - CBC -If urine culture is negative and urinary frequency/back pain continues consider CT stone study versus overactive bladder medication start.  Elevated LFTs 4 Weeks ago mild elevation of ALT.  Repeat today to confirm. - Comp Met (CMET)  Strain of thoracic back region Rest.  Heat. Robaxin prescribed     Reviewed expectations re: course of current medical issues.  Discussed self-management of  symptoms.  Outlined signs and symptoms indicating need for more acute intervention.  Patient verbalized understanding and all questions were answered.  Patient received an After-Visit Summary.    Orders Placed This Encounter  Procedures  . POCT urinalysis dipstick     Note is dictated utilizing voice recognition software. Although note has been proof read prior to signing, occasional typographical errors still can be missed. If any questions arise, please do not hesitate to call for verification.   electronically signed by:  Howard Pouch, DO  [place]

## 2019-06-06 NOTE — Patient Instructions (Signed)
              08/18/14   [name], MD Consulting Physician Gastroenterology  05/29/16     Start omnicef every 12 hours for 7 days.  We will call you with lab results  Hydrate with water.  If increased discomfort take nsaid.

## 2019-06-07 LAB — CBC
HCT: 43.8 % (ref 35.0–45.0)
Hemoglobin: 14.9 g/dL (ref 11.7–15.5)
MCH: 30.7 pg (ref 27.0–33.0)
MCHC: 34 g/dL (ref 32.0–36.0)
MCV: 90.3 fL (ref 80.0–100.0)
MPV: 11.3 fL (ref 7.5–12.5)
Platelets: 251 10*3/uL (ref 140–400)
RBC: 4.85 10*6/uL (ref 3.80–5.10)
RDW: 12.2 % (ref 11.0–15.0)
WBC: 6.3 10*3/uL (ref 3.8–10.8)

## 2019-06-07 LAB — COMPREHENSIVE METABOLIC PANEL
AG Ratio: 2 (calc) (ref 1.0–2.5)
ALT: 30 U/L — ABNORMAL HIGH (ref 6–29)
AST: 24 U/L (ref 10–35)
Albumin: 4.6 g/dL (ref 3.6–5.1)
Alkaline phosphatase (APISO): 63 U/L (ref 37–153)
BUN: 11 mg/dL (ref 7–25)
CO2: 23 mmol/L (ref 20–32)
Calcium: 9.3 mg/dL (ref 8.6–10.4)
Chloride: 106 mmol/L (ref 98–110)
Creat: 0.92 mg/dL (ref 0.50–1.05)
Globulin: 2.3 g/dL (calc) (ref 1.9–3.7)
Glucose, Bld: 91 mg/dL (ref 65–99)
Potassium: 3.9 mmol/L (ref 3.5–5.3)
Sodium: 140 mmol/L (ref 135–146)
Total Bilirubin: 0.6 mg/dL (ref 0.2–1.2)
Total Protein: 6.9 g/dL (ref 6.1–8.1)

## 2019-06-08 LAB — URINE CULTURE
MICRO NUMBER:: 10453663
SPECIMEN QUALITY:: ADEQUATE

## 2019-06-11 NOTE — Progress Notes (Deleted)
59 y.o. G27P0020 Divorced Caucasian female here for annual exam.    PCP:     Patient's last menstrual period was 01/31/2007.           Sexually active: {yes no:314532}  The current method of family planning is post menopausal status.    Exercising: {yes no:314532}  {types:19826} Smoker:  no  Health Maintenance: Pap:02-20-18 Neg:Neg HR HPV, 04-18-16 neg HPV HR neg,  03-11-15 neg HPV HR +, 16/18 neg  History of abnormal Pap: ***Yes,  MMG:  ***02-19-18 3D/Neg/density A/BiRads1 Colonoscopy: 2017;next due 2022 BMD:10-30-17    Result :***Osteoporosis of spine TDaP: 01-30-14 Gardasil:   no HIV: 2016 Neg Hep C: 2016 Neg Screening Labs:  Hb today: ***, Urine today: ***   reports that she has never smoked. She has never used smokeless tobacco. She reports previous alcohol use. She reports that she does not use drugs.  Past Medical History:  Diagnosis Date  . Anxiety   . Arthritis    knees and spine, shoulder  . Asthma   . Bronchitis    hx - recurrent  . Complication of anesthesia    waking up is not easy  . COVID-19   . Depression   . Elevated IgE level 09/12/2017   09/12/2017 IgE 195  . H/O miscarriage, not currently pregnant   . Hx of irritable bowel syndrome    x2  . Hyperlipidemia    diet controlled - no medication  . Hypertension    not taking any meds at present - under control per patient  . Hypokalemia    with PNA admission (2.5)  . Migraines   . Pneumonia    4 episodes; hosp. admission 2014  . PONV (postoperative nausea and vomiting)   . Thyroid disease     Past Surgical History:  Procedure Laterality Date  . BREAST SURGERY     implants, then had them removed  . COLONOSCOPY     greater 10 yrs ago - ? [place]    . LAPAROSCOPIC ABDOMINAL EXPLORATION  1994   endometriosis  . ORIF HUMERUS FRACTURE Left 04/01/2013   DR [facility] - shoulder  . ORIF HUMERUS FRACTURE Left 04/01/2013   Procedure: OPEN REDUCTION INTERNAL  FIXATION (ORIF) LEFT PROXIMAL HUMERUS FRACTURE;  Surgeon: Mcarthur Rossetti, MD;  Location: [place];  Service: Orthopedics;  Laterality: Left;  . TONSILLECTOMY AND ADENOIDECTOMY      Current Outpatient Medications  Medication Sig Dispense Refill  . albuterol (VENTOLIN HFA) 108 (90 Base) MCG/ACT inhaler Inhale 2 puffs into the lungs every 6 (six) hours as needed for wheezing or shortness of breath. 6.7 g 5  . atorvastatin (LIPITOR) 20 MG tablet Take 1 tablet (20 mg total) by mouth daily. 90 tablet 3  . cefdinir (OMNICEF) 300 MG capsule Take 1 capsule (300 mg total) by mouth 2 (two) times daily. 14 capsule 0  . clonazePAM (KLONOPIN) 0.5 MG tablet TAKE 1 TABLET BY MOUTH THREE TIMES A DAY AS NEEDED FOR ANXIETY 45 tablet 0  . levothyroxine (SYNTHROID) 112 MCG tablet Take 1 tablet (112 mcg total) by mouth daily. 90 tablet 3  . methocarbamol (ROBAXIN) 500 MG tablet Take 1 tablet (500 mg total) by mouth 4 (four) times daily as needed for muscle spasms. 40 tablet 0  . montelukast (SINGULAIR) 10 MG tablet Take 1 tablet (10 mg total) by mouth at bedtime. 30 tablet 11  . PARoxetine (PAXIL) 40 MG tablet TAKE 1 TABLET BY  MOUTH EVERY DAY 90 tablet 1  . rizatriptan (MAXALT) 5 MG tablet Take 1 tablet (5 mg total) by mouth as needed for migraine. May repeat in 2 hours if needed 10 tablet 5  . Spacer/Aero-Holding Chambers (AEROCHAMBER MV) inhaler Use as instructed 1 each 2  . Vitamin D, Ergocalciferol, (DRISDOL) 1.25 MG (50000 UNIT) CAPS capsule Take 1 capsule (50,000 Units total) by mouth every 7 (seven) days. 12 capsule 0   No current facility-administered medications for this visit.    Family History  Problem Relation Age of Onset  . Breast cancer Mother   . Diabetes Mother   . Heart disease Mother   . Diabetes Father   . Dementia Father   . Colon cancer Father   . Stroke Paternal Grandmother   . Diabetes Paternal Grandmother     Review of Systems  Exam:   LMP 01/31/2007     General  appearance: alert, cooperative and appears stated age Head: normocephalic, without obvious abnormality, atraumatic Neck: no adenopathy, supple, symmetrical, trachea midline and thyroid normal to inspection and palpation Lungs: clear to auscultation bilaterally Breasts: normal appearance, no masses or tenderness, No nipple retraction or dimpling, No nipple discharge or bleeding, No axillary adenopathy Heart: regular rate and rhythm Abdomen: soft, non-tender; no masses, no organomegaly Extremities: extremities normal, atraumatic, no cyanosis or edema Skin: skin color, texture, turgor normal. No rashes or lesions Lymph nodes: cervical, supraclavicular, and axillary nodes normal. Neurologic: grossly normal  Pelvic: External genitalia:  no lesions              No abnormal inguinal nodes palpated.              Urethra:  normal appearing urethra with no masses, tenderness or lesions              Bartholins and Skenes: normal                 Vagina: normal appearing vagina with normal color and discharge, no lesions              Cervix: no lesions              Pap taken: {yes no:314532} Bimanual Exam:  Uterus:  normal size, contour, position, consistency, mobility, non-tender              Adnexa: no mass, fullness, tenderness              Rectal exam: {yes no:314532}.  Confirms.              Anus:  normal sphincter tone, no lesions  Chaperone was present for exam.  Assessment:   Well woman visit with normal exam.   Plan: Mammogram screening discussed. Self breast awareness reviewed. Pap and HR HPV as above. Guidelines for Calcium, Vitamin D, regular exercise program including cardiovascular and weight bearing exercise.   Follow up annually and prn.   Additional counseling given.  {yes [id number]. _______ minutes face to face time of which over 50% was spent in counseling.    After visit summary provided.

## 2019-06-12 ENCOUNTER — Ambulatory Visit: Payer: Self-pay | Admitting: Obstetrics and Gynecology

## 2019-06-12 ENCOUNTER — Telehealth: Payer: Self-pay

## 2019-06-12 DIAGNOSIS — M810 Age-related osteoporosis without current pathological fracture: Secondary | ICD-10-CM | POA: Insufficient documentation

## 2019-06-12 NOTE — Telephone Encounter (Signed)
Received faxed Physician order  from [facility] for pt to have Bone density completed. Mammogram scheduled for 06/25/2019. Placed on Dr Dierdre Highman desk to review.

## 2019-06-12 NOTE — Telephone Encounter (Signed)
Pt was called and she stated she never saw Endocrine and did not realize she was supposed to go to see someone. She would rather Dr Raoul Pitch take care of this if possible and has no desire to go to Endocrine. Appt was made for pateint with Dr Raoul Pitch. She is not on any medications at this time.

## 2019-06-12 NOTE — Telephone Encounter (Signed)
It is too early for patient to have a DEXA scan.  She had a DEXA scan 10/30/2017 at the breast center with a T score of -2.9, which is osteoporotic.  This was ordered by Dr. Valeta Harms [facility]) and then he referred her to endocrinology, per her request,  secondary to osteoporotic results.   She will be eligible for DEXA scan 10/31/2019- unless her endocrinologist felt it was needed sooner. If that is the case, then the treating physician, (endocrine) in this case, should place the order if they told her to get dexa yearly.    Unfortunately, I had not been involved in her care surrounding her prior bone densities and osteoporosis-nor had knowledge of it.  I also do not have records from endocrinology to review or if treatment options were discussed etc. Is she receiving treatment for her osteoporosis?  If so what is she prescribed?  Please ask her which endocrinologist she saw for her osteoporosis so that we may request records.   Osteoporosis added to her problem list today by this provider.

## 2019-06-18 ENCOUNTER — Other Ambulatory Visit: Payer: Self-pay

## 2019-06-18 ENCOUNTER — Encounter: Payer: Self-pay | Admitting: Family Medicine

## 2019-06-18 ENCOUNTER — Ambulatory Visit: Payer: BC Managed Care – PPO | Admitting: Family Medicine

## 2019-06-18 ENCOUNTER — Ambulatory Visit: Payer: BC Managed Care – PPO

## 2019-06-18 VITALS — BP 137/88 | HR 87 | Temp 98.0°F | Resp 16 | Ht 65.0 in | Wt 184.5 lb

## 2019-06-18 DIAGNOSIS — M810 Age-related osteoporosis without current pathological fracture: Secondary | ICD-10-CM

## 2019-06-18 DIAGNOSIS — Z23 Encounter for immunization: Secondary | ICD-10-CM | POA: Diagnosis not present

## 2019-06-18 MED ORDER — ALENDRONATE SODIUM 70 MG PO TABS: 70.0000 mg | ORAL_TABLET | ORAL | 11 refills | Status: DC

## 2019-06-18 NOTE — Progress Notes (Signed)
This visit occurred during the SARS-CoV-2 public health emergency.  Safety protocols were in place, including screening questions prior to the visit, additional usage of staff PPE, and extensive cleaning of exam room while observing appropriate contact time as indicated for disinfecting solutions.    Tiffany Horn , April 28, 1960, 59 y.o., female MRN: [id number] Patient Care Team    Relationship Specialty Notifications Start End  [name], DO PCP - General Family Medicine  08/18/14   [name], MD Consulting Physician Gastroenterology  05/29/16   [name], CNM Referring Physician Certified Nurse Midwife  05/29/16   Gari Crown, MD Referring Physician Obstetrics and Gynecology  07/17/17   Garner Nash, DO Consulting Physician Pulmonary Disease  06/12/19     Chief Complaint  Patient presents with  . Osteoporosis    Discuss meds. Would like shingles vaccine.     Subjective: Pt presents for an OV to discuss her osteoporosis diagnosis. She had a DEXA completed 10/2017- ordered by another provider that resulted with T-score of -2.9. Pt had not been provided with a treatment plan. It appears an endocrine referral was discussed with her by ordering provider. Pt presents today to discuss results, osteoporosis treatment options and receive her shingrix vaccine.   Depression screen [facility] 2/9 05/21/2019 03/06/2017 09/29/2016 05/29/2016 09/01/2014  Decreased Interest 3 2 1  0 3  Down, Depressed, Hopeless 3 2 3  0 3  PHQ - 2 Score 6 4 4  0 6  Altered sleeping 1 2 0 - 3  Tired, decreased energy 3 3 3  - 3  Change in appetite 1 0 0 - 3  Feeling bad or failure about yourself  0 0 0 - 3  Trouble concentrating 0 1 2 - 3  Moving slowly or fidgety/restless 2 0 2 - 0  Suicidal thoughts 0 0 0 - 0  PHQ-9 Score 13 10 11  - 21  Difficult doing work/chores Somewhat difficult - - - -    Allergies  Allergen Reactions  . Amoxicillin Diarrhea  . Seroquel [Quetiapine Fumarate]     Unknown    Social History   Social History Narrative   She is originally from [place]. She has traveled to [facility], [place], [place], [place], [place], [place], [place], [place], [place]. No international travel. She has dogs. No prior bird, mold, or recent hot tub exposure. She hasn't used her hot tub in 1.5 years. 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 Previously enjoyed gardening and playing tennis. Helps to care for her mother.   Past Medical History:  Diagnosis Date  . Anxiety   . Arthritis    knees and spine, shoulder  . Asthma   . Bronchitis    hx - recurrent  . Complication of anesthesia    waking up is not easy  . COVID-19   . Depression   . Elevated IgE level 09/12/2017   09/12/2017 IgE 195  . H/O miscarriage, not currently pregnant   . Hx of irritable bowel syndrome    x2  . Hyperlipidemia    diet controlled - no medication  . Hypertension    not taking any meds at present - under control per patient  . Hypokalemia    with PNA admission (2.5)  . Migraines   . Pneumonia    4 episodes; hosp. admission 2014  . PONV (postoperative nausea and vomiting)   . Thyroid disease    Past Surgical  History:  Procedure Laterality Date  . BREAST SURGERY     implants, then had them removed  . COLONOSCOPY     greater 10 yrs ago - ? [place]    . LAPAROSCOPIC ABDOMINAL EXPLORATION  1994   endometriosis  . ORIF HUMERUS FRACTURE Left 04/01/2013   DR [facility] - shoulder  . ORIF HUMERUS FRACTURE Left 04/01/2013   Procedure: OPEN REDUCTION INTERNAL FIXATION (ORIF) LEFT PROXIMAL HUMERUS FRACTURE;  Surgeon: Mcarthur Rossetti, MD;  Location: [place];  Service: Orthopedics;  Laterality: Left;  . TONSILLECTOMY AND ADENOIDECTOMY     Family History  Problem Relation Age of Onset  . Breast cancer Mother   . Diabetes Mother   . Heart disease Mother   . Diabetes Father   . Dementia Father   . Colon cancer Father   . Stroke  Paternal Grandmother   . Diabetes Paternal Grandmother    Allergies as of 06/18/2019      Reactions   Amoxicillin Diarrhea   Seroquel [quetiapine Fumarate]    Unknown      Medication List       Accurate as of Jun 18, 2019  2:29 PM. If you have any questions, ask your nurse or doctor.        STOP taking these medications   cefdinir 300 MG capsule Commonly known as: OMNICEF Stopped by: Howard Pouch, DO     TAKE these medications   AeroChamber MV inhaler Use as instructed   albuterol 108 (90 Base) MCG/ACT inhaler Commonly known as: VENTOLIN HFA Inhale 2 puffs into the lungs every 6 (six) hours as needed for wheezing or shortness of breath.   alendronate 70 MG tablet Commonly known as: FOSAMAX Take 1 tablet (70 mg total) by mouth every 7 (seven) days. Take with a full glass of water on an empty stomach. Started by: Howard Pouch, DO   atorvastatin 20 MG tablet Commonly known as: LIPITOR Take 1 tablet (20 mg total) by mouth daily.   clonazePAM 0.5 MG tablet Commonly known as: KLONOPIN TAKE 1 TABLET BY MOUTH THREE TIMES A DAY AS NEEDED FOR ANXIETY   levothyroxine 112 MCG tablet Commonly known as: SYNTHROID Take 1 tablet (112 mcg total) by mouth daily.   methocarbamol 500 MG tablet Commonly known as: Robaxin Take 1 tablet (500 mg total) by mouth 4 (four) times daily as needed for muscle spasms.   montelukast 10 MG tablet Commonly known as: SINGULAIR Take 1 tablet (10 mg total) by mouth at bedtime.   PARoxetine 40 MG tablet Commonly known as: PAXIL TAKE 1 TABLET BY MOUTH EVERY DAY   rizatriptan 5 MG tablet Commonly known as: MAXALT Take 1 tablet (5 mg total) by mouth as needed for migraine. May repeat in 2 hours if needed   Vitamin D (Ergocalciferol) 1.25 MG (50000 UNIT) Caps capsule Commonly known as: DRISDOL Take 1 capsule (50,000 Units total) by mouth every 7 (seven) days.       All past medical history, surgical history, allergies, family history,  immunizations andmedications were updated in the EMR today and reviewed under the history and medication portions of their EMR.     ROS: Negative, with the exception of above mentioned in HPI   Objective:  BP 137/88 (BP Location: Left Arm, Patient Position: Sitting, Cuff Size: Normal)   Pulse 87   Temp 98 F (36.7 C) (Temporal)   Resp 16   Ht 5\' 5"  (1.651 m)  Wt 184 lb 8 oz (83.7 kg)   LMP 01/31/2007   SpO2 96%   BMI 30.70 kg/m  Body mass index is 30.7 kg/m. Gen: Afebrile. No acute distress. Nontoxic in appearance, well developed, well nourished. Pt is sad today. Grief from her mother's death.  HENT: AT. [place].  Eyes:Pupils Equal Round Reactive to light, Extraocular movements intact,  Conjunctiva without redness, discharge or icterus. Neuro:  Normal gait. PERLA. EOMi. Alert. Oriented x3  No exam data present No results found. No results found for this or any previous visit (from the past 24 hour(s)).  Assessment/Plan: Tiffany Horn is a 59 y.o. female present for OV for  Osteoporosis:  Discussed results with her from her 10/2017 bone density ordered by another provider.  Bone density repeat 10/2019> ordered.  Discussed treatment options for osteoporosis and she would like to start fosamax weekly. Instructions on proper use was discussed with her today.   Shingrix #1 provided to her today. Nurse visit 2-6 mos for shingrix #2.    Reviewed expectations re: course of current medical issues.  Discussed self-management of symptoms.  Outlined signs and symptoms indicating need for more acute intervention.  Patient verbalized understanding and all questions were answered.  Patient received an After-Visit Summary.    Orders Placed This Encounter  Procedures  . DG Bone Density   Meds ordered this encounter  Medications  . alendronate (FOSAMAX) 70 MG tablet    Sig: Take 1 tablet (70 mg total) by mouth every 7 (seven) days. Take with a full glass of water on an empty  stomach.    Dispense:  4 tablet    Refill:  11   Referral Orders  No referral(s) requested today     Note is dictated utilizing voice recognition software. Although note has been proof read prior to signing, occasional typographical errors still can be missed. If any questions arise, please do not hesitate to call for verification.   electronically signed by:  Howard Pouch, DO  [place]

## 2019-06-18 NOTE — Patient Instructions (Signed)
shingrix #2 by nurse visit in 2-6 months.   Start fosamax once weekly for osteoporosis.  I ordered your bone density for OCTober. They will call you.    Osteoporosis  Osteoporosis is thinning and loss of density in your bones. Osteoporosis makes bones more brittle and fragile and more likely to break (fracture). Over time, osteoporosis can cause your bones to become so weak that they fracture after a minor fall. Bones in the hip, wrist, and spine are most likely to fracture due to osteoporosis. What are the causes? The exact cause of this condition is not known. What increases the risk? You may be at greater risk for osteoporosis if you:  Have a family history of the condition.  Have poor nutrition.  Use steroid medicines, such as prednisone.  Are female.  Are age 38 or older.  Smoke or have a history of smoking.  Are not physically active (are sedentary).  Are white (Caucasian) or of Asian descent.  Have a small body frame.  Take certain medicines, such as antiseizure medicines. What are the signs or symptoms? A fracture might be the first sign of osteoporosis, especially if the fracture results from a fall or injury that usually would not cause a bone to break. Other signs and symptoms include:  Pain in the neck or low back.  Stooped posture.  Loss of height. How is this diagnosed? This condition may be diagnosed based on:  Your medical history.  A physical exam.  A bone mineral density test, also called a DXA or DEXA test (dual-energy X-ray absorptiometry test). This test uses X-rays to measure the amount of minerals in your bones. How is this treated? The goal of treatment is to strengthen your bones and lower your risk for a fracture. Treatment may involve:  Making lifestyle changes, such as: ? Including foods with more calcium and vitamin D in your diet. ? Doing weight-bearing and muscle-strengthening exercises. ? Stopping tobacco use. ? Limiting alcohol  intake.  Taking medicine to slow the process of bone loss or to increase bone density.  Taking daily supplements of calcium and vitamin D.  Taking hormone replacement medicines, such as estrogen for women and testosterone for men.  Monitoring your levels of calcium and vitamin D. Follow these instructions at home:  Activity  Exercise as told by your health care provider. Ask your health care provider what exercises and activities are safe for you. You should do: ? Exercises that make you work against gravity (weight-bearing exercises), such as tai chi, yoga, or walking. ? Exercises to strengthen muscles, such as lifting weights. Lifestyle  Limit alcohol intake to no more than 1 drink a day for nonpregnant women and 2 drinks a day for men. One drink equals 12 oz of beer, 5 oz of wine, or 1 oz of hard liquor.  Do not use any products that contain nicotine or tobacco, such as cigarettes and e-cigarettes. If you need help quitting, ask your health care provider. Preventing falls  Use devices to help you move around (mobility aids) as needed, such as canes, walkers, scooters, or crutches.  Keep rooms well-lit and clutter-free.  Remove tripping hazards from walkways, including cords and throw rugs.  Install grab bars in bathrooms and safety rails on stairs.  Use rubber mats in the bathroom and other areas that are often wet or slippery.  Wear closed-toe shoes that fit well and support your feet. Wear shoes that have rubber soles or low heels.  Review your medicines  with your health care provider. Some medicines can cause dizziness or changes in blood pressure, which can increase your risk of falling. General instructions  Include calcium and vitamin D in your diet. Calcium is important for bone health, and vitamin D helps your body to absorb calcium. Good sources of calcium and vitamin D include: ? Certain fatty fish, such as salmon and tuna. ? Products that have calcium and  vitamin D added to them (fortified products), such as fortified cereals. ? Egg yolks. ? Cheese. ? Liver.  Take over-the-counter and prescription medicines only as told by your health care provider.  Keep all follow-up visits as told by your health care provider. This is important. Contact a health care provider if:  You have never been screened for osteoporosis and you are: ? A woman who is age 51 or older. ? A man who is age 102 or older. Get help right away if:  You fall or injure yourself. Summary  Osteoporosis is thinning and loss of density in your bones. This makes bones more brittle and fragile and more likely to break (fracture),even with minor falls.  The goal of treatment is to strengthen your bones and reduce your risk for a fracture.  Include calcium and vitamin D in your diet. Calcium is important for bone health, and vitamin D helps your body to absorb calcium.  Talk with your health care provider about screening for osteoporosis if you are a woman who is age 38 or older, or a man who is age 60 or older. This information is not intended to replace advice given to you by your health care provider. Make sure you discuss any questions you have with your health care provider. Document Revised: 12/29/2016 Document Reviewed: 11/10/2016 Elsevier Patient Education  2020 [employer].

## 2019-06-22 ENCOUNTER — Other Ambulatory Visit: Payer: Self-pay | Admitting: Physician Assistant

## 2019-06-23 NOTE — Telephone Encounter (Signed)
Has apt 06/04

## 2019-06-25 ENCOUNTER — Other Ambulatory Visit: Payer: Self-pay

## 2019-06-25 LAB — HM MAMMOGRAPHY

## 2019-06-26 ENCOUNTER — Ambulatory Visit: Payer: BC Managed Care – PPO | Admitting: Obstetrics and Gynecology

## 2019-06-26 ENCOUNTER — Telehealth: Payer: Self-pay

## 2019-06-26 NOTE — Progress Notes (Deleted)
59 y.o. G53P0020 Divorced Caucasian female here for annual exam.    PCP:     Patient's last menstrual period was 01/31/2007.           Sexually active: {yes no:314532}  The current method of family planning is post menopausal status.    Exercising: {yes no:314532}  {types:19826} Smoker:  no  Health Maintenance: Pap: 02-20-18 Neg:Neg HR HPV,04-18-16 Neg:Neg HR HPV, 03-11-15 Neg:Pos HR HPV, Neg 16/18 History of abnormal Pap:  {YES NO:22349} MMG:  ***02-19-18 normal per patient Colonoscopy: 2017;next 5 years BMD: ***2019  Result : TDaP: 2016 Gardasil:   no HIV:10-19-14 NR Hep C:10-19-14 Neg Screening Labs:  Hb today: ***, Urine today: ***   reports that she has never smoked. She has never used smokeless tobacco. She reports previous alcohol use. She reports that she does not use drugs.  Past Medical History:  Diagnosis Date  . Anxiety   . Arthritis    knees and spine, shoulder  . Asthma   . Bronchitis    hx - recurrent  . Complication of anesthesia    waking up is not easy  . COVID-19   . Depression   . Elevated IgE level 09/12/2017   09/12/2017 IgE 195  . H/O miscarriage, not currently pregnant   . Hx of irritable bowel syndrome    x2  . Hyperlipidemia    diet controlled - no medication  . Hypertension    not taking any meds at present - under control per patient  . Hypokalemia    with PNA admission (2.5)  . Migraines   . Pneumonia    4 episodes; hosp. admission 2014  . PONV (postoperative nausea and vomiting)   . Thyroid disease     Past Surgical History:  Procedure Laterality Date  . BREAST SURGERY     implants, then had them removed  . COLONOSCOPY     greater 10 yrs ago - ? [place]    . LAPAROSCOPIC ABDOMINAL EXPLORATION  1994   endometriosis  . ORIF HUMERUS FRACTURE Left 04/01/2013   [facility] - shoulder  . ORIF HUMERUS FRACTURE Left 04/01/2013   Procedure: OPEN REDUCTION INTERNAL FIXATION (ORIF) LEFT PROXIMAL  HUMERUS FRACTURE;  Surgeon: Mcarthur Rossetti, MD;  Location: [facility];  Service: Orthopedics;  Laterality: Left;  . TONSILLECTOMY AND ADENOIDECTOMY      Current Outpatient Medications  Medication Sig Dispense Refill  . albuterol (VENTOLIN HFA) 108 (90 Base) MCG/ACT inhaler Inhale 2 puffs into the lungs every 6 (six) hours as needed for wheezing or shortness of breath. 6.7 g 5  . alendronate (FOSAMAX) 70 MG tablet Take 1 tablet (70 mg total) by mouth every 7 (seven) days. Take with a full glass of water on an empty stomach. 4 tablet 11  . atorvastatin (LIPITOR) 20 MG tablet Take 1 tablet (20 mg total) by mouth daily. 90 tablet 3  . clonazePAM (KLONOPIN) 0.5 MG tablet TAKE 1 TABLET BY MOUTH THREE TIMES A DAY AS NEEDED FOR ANXIETY 45 tablet 0  . levothyroxine (SYNTHROID) 112 MCG tablet Take 1 tablet (112 mcg total) by mouth daily. 90 tablet 3  . methocarbamol (ROBAXIN) 500 MG tablet Take 1 tablet (500 mg total) by mouth 4 (four) times daily as needed for muscle spasms. 40 tablet 0  . montelukast (SINGULAIR) 10 MG tablet Take 1 tablet (10 mg total) by mouth at bedtime. 30 tablet 11  . PARoxetine (PAXIL) 40 MG tablet  TAKE 1 TABLET BY MOUTH EVERY DAY 90 tablet 1  . rizatriptan (MAXALT) 5 MG tablet Take 1 tablet (5 mg total) by mouth as needed for migraine. May repeat in 2 hours if needed 10 tablet 5  . Spacer/Aero-Holding Chambers (AEROCHAMBER MV) inhaler Use as instructed 1 each 2  . Vitamin D, Ergocalciferol, (DRISDOL) 1.25 MG (50000 UNIT) CAPS capsule Take 1 capsule (50,000 Units total) by mouth every 7 (seven) days. 12 capsule 0   No current facility-administered medications for this visit.    Family History  Problem Relation Age of Onset  . Breast cancer Mother   . Diabetes Mother   . Heart disease Mother   . Diabetes Father   . Dementia Father   . Colon cancer Father   . Stroke Paternal Grandmother   . Diabetes Paternal Grandmother     Review of Systems  Exam:   LMP 01/31/2007      General appearance: alert, cooperative and appears stated age Head: normocephalic, without obvious abnormality, atraumatic Neck: no adenopathy, supple, symmetrical, trachea midline and thyroid normal to inspection and palpation Lungs: clear to auscultation bilaterally Breasts: normal appearance, no masses or tenderness, No nipple retraction or dimpling, No nipple discharge or bleeding, No axillary adenopathy Heart: regular rate and rhythm Abdomen: soft, non-tender; no masses, no organomegaly Extremities: extremities normal, atraumatic, no cyanosis or edema Skin: skin color, texture, turgor normal. No rashes or lesions Lymph nodes: cervical, supraclavicular, and axillary nodes normal. Neurologic: grossly normal  Pelvic: External genitalia:  no lesions              No abnormal inguinal nodes palpated.              Urethra:  normal appearing urethra with no masses, tenderness or lesions              Bartholins and Skenes: normal                 Vagina: normal appearing vagina with normal color and discharge, no lesions              Cervix: no lesions              Pap taken: {yes no:314532} Bimanual Exam:  Uterus:  normal size, contour, position, consistency, mobility, non-tender              Adnexa: no mass, fullness, tenderness              Rectal exam: {yes no:314532}.  Confirms.              Anus:  normal sphincter tone, no lesions  Chaperone was present for exam.  Assessment:   Well woman visit with normal exam.   Plan: Mammogram screening discussed. Self breast awareness reviewed. Pap and HR HPV as above. Guidelines for Calcium, Vitamin D, regular exercise program including cardiovascular and weight bearing exercise.   Follow up annually and prn.   Additional counseling given.  {yes [id number]. _______ minutes face to face time of which over 50% was spent in counseling.    After visit summary provided.

## 2019-06-26 NOTE — Telephone Encounter (Signed)
[facility] sent results from pts mammogram completed on 06/25/2019. Abstracted into chart. Placed on Dr Lucita Lora desk to review.   Bi-RADS Cat 1: Negative  Letter sent to pt with results.

## 2019-07-01 NOTE — Telephone Encounter (Signed)
Please inform patient the following information: Mammogram is normal.   

## 2019-07-02 NOTE — Telephone Encounter (Signed)
Pt was called and given results. 

## 2019-07-03 ENCOUNTER — Telehealth: Payer: Self-pay | Admitting: Internal Medicine

## 2019-07-03 NOTE — Telephone Encounter (Signed)
[place] has questions in regards to her mother's ( your patient)  passing.

## 2019-07-03 NOTE — Telephone Encounter (Signed)
Called patient's daughter and left message for her to call office

## 2019-07-04 ENCOUNTER — Encounter: Payer: Self-pay | Admitting: Physician Assistant

## 2019-07-04 ENCOUNTER — Telehealth (INDEPENDENT_AMBULATORY_CARE_PROVIDER_SITE_OTHER): Payer: BC Managed Care – PPO | Admitting: Physician Assistant

## 2019-07-04 ENCOUNTER — Telehealth: Payer: Self-pay | Admitting: Physician Assistant

## 2019-07-04 DIAGNOSIS — F331 Major depressive disorder, recurrent, moderate: Secondary | ICD-10-CM | POA: Diagnosis not present

## 2019-07-04 DIAGNOSIS — F4321 Adjustment disorder with depressed mood: Secondary | ICD-10-CM | POA: Diagnosis not present

## 2019-07-04 DIAGNOSIS — F411 Generalized anxiety disorder: Secondary | ICD-10-CM

## 2019-07-04 MED ORDER — CLONAZEPAM 0.5 MG PO TABS: ORAL_TABLET | ORAL | 2 refills | Status: DC

## 2019-07-04 NOTE — Telephone Encounter (Signed)
[name], Tiffany Horn are scheduled for a virtual visit with your provider today.    Just as we do with appointments in the office, we must obtain your consent to participate.  Your consent will be active for this visit and any virtual visit you may have with one of our providers in the next 365 days.    If you have a MyChart account, I can also send a copy of this consent to you electronically.  All virtual visits are billed to your insurance company just like a traditional visit in the office.  As this is a virtual visit, video technology does not allow for your provider to perform a traditional examination.  This may limit your provider's ability to fully assess your condition.  If your provider identifies any concerns that need to be evaluated in person or the need to arrange testing such as labs, EKG, etc, we will make arrangements to do so.    Although advances in technology are sophisticated, we cannot ensure that it will always work on either your end or our end.  If the connection with a video visit is poor, we may have to switch to a telephone visit.  With either a video or telephone visit, we are not always able to ensure that we have a secure connection.   I need to obtain your verbal consent now.   Are you willing to proceed with your visit today?   Tiffany Horn has provided verbal consent on 07/04/2019 for a virtual visit (video or telephone).   Donnal Moat, PA-C 07/04/2019  4:24 PM

## 2019-07-04 NOTE — Progress Notes (Signed)
[facility] Med Check  Patient ID: Tiffany Horn,  MRN: [id number]  PCP: [name], DO  Date of Evaluation: 07/04/2019 Time spent:30 minutes  Chief Complaint:  Chief Complaint    Follow-up     Virtual Visit via Telephone Note  I connected with patient by a video enabled telemedicine application or telephone, with their informed consent, and verified patient privacy and that I am speaking with the correct person using two identifiers.  I am private, in my office and the patient is home.  I discussed the limitations, risks, security and privacy concerns of performing an evaluation and management service by telephone and the availability of in person appointments. I also discussed with the patient that there may be a patient responsible charge related to this service. The patient expressed understanding and agreed to proceed.   I discussed the assessment and treatment plan with the patient. The patient was provided an opportunity to ask questions and all were answered. The patient agreed with the plan and demonstrated an understanding of the instructions.   The patient was advised to call back or seek an in-person evaluation if the symptoms worsen or if the condition fails to improve as anticipated.  I provided 30 minutes of non-face-to-face time during this encounter.  HISTORY/CURRENT STATUS: HPI For routine med check.  Is very depressed.  Cries qd. Struggles to go to work but she goes [employer]) She isn't sleeping well. Doesn't have any interest in doing anything. Is really anxious and is taking more Klonopin, which does help.   Has no energy. Since her Mom died and her house is cleaned out now to be ready to sell, it makes her feel even worse.  "I don't know how to live without her."  Denies suicidal or homicidal thoughts.  Patient denies increased energy with decreased need for sleep, no increased talkativeness, no racing thoughts, no impulsivity or risky behaviors, no  increased spending, no increased libido, no grandiosity, no increased irritability or anger, and no hallucinations.  Denies dizziness, syncope, seizures, numbness, tingling, tremor, tics, unsteady gait, slurred speech, confusion. Denies muscle or joint pain, stiffness, or dystonia.  Individual Medical History/ Review of Systems: Changes? :No    Past medications for mental health diagnoses include:  Cymbalta quit working,  Effexor 'was bad.' 'everything'  Paxil works best. (I am not able to find the old paper chart and this is the only information I am able to obtain from the patient.)  Allergies: Amoxicillin and Seroquel [quetiapine fumarate]  Current Medications:  Current Outpatient Medications:  .  albuterol (VENTOLIN HFA) 108 (90 Base) MCG/ACT inhaler, Inhale 2 puffs into the lungs every 6 (six) hours as needed for wheezing or shortness of breath., Disp: 6.7 g, Rfl: 5 .  alendronate (FOSAMAX) 70 MG tablet, Take 1 tablet (70 mg total) by mouth every 7 (seven) days. Take with a full glass of water on an empty stomach., Disp: 4 tablet, Rfl: 11 .  atorvastatin (LIPITOR) 20 MG tablet, Take 1 tablet (20 mg total) by mouth daily., Disp: 90 tablet, Rfl: 3 .  clonazePAM (KLONOPIN) 0.5 MG tablet, 1 po tid prn, Disp: 90 tablet, Rfl: 2 .  levothyroxine (SYNTHROID) 112 MCG tablet, Take 1 tablet (112 mcg total) by mouth daily., Disp: 90 tablet, Rfl: 3 .  methocarbamol (ROBAXIN) 500 MG tablet, Take 1 tablet (500 mg total) by mouth 4 (four) times daily as needed for muscle spasms., Disp: 40 tablet, Rfl: 0 .  montelukast (SINGULAIR) 10 MG tablet, Take 1  tablet (10 mg total) by mouth at bedtime., Disp: 30 tablet, Rfl: 11 .  PARoxetine (PAXIL) 40 MG tablet, TAKE 1 TABLET BY MOUTH EVERY DAY (Patient taking differently: 60 mg. ), Disp: 90 tablet, Rfl: 1 .  rizatriptan (MAXALT) 5 MG tablet, Take 1 tablet (5 mg total) by mouth as needed for migraine. May repeat in 2 hours if needed, Disp: 10 tablet, Rfl: 5 .   Spacer/Aero-Holding Chambers (AEROCHAMBER MV) inhaler, Use as instructed, Disp: 1 each, Rfl: 2 .  Vitamin D, Ergocalciferol, (DRISDOL) 1.25 MG (50000 UNIT) CAPS capsule, Take 1 capsule (50,000 Units total) by mouth every 7 (seven) days., Disp: 12 capsule, Rfl: 0 Medication Side Effects: none  Family Medical/ Social History: Changes? No  MENTAL HEALTH EXAM:  Last menstrual period 01/31/2007.There is no height or weight on file to calculate BMI.  General Appearance: unable to assess  Eye Contact:  unable to assess  Speech:  Clear and Coherent  Volume:  Normal  Mood:  Depressed  Affect:  unable to assess  Thought Process:  Goal Directed and Descriptions of Associations: Intact  Orientation:  Full (Time, Place, and Person)  Thought Content: Logical   Suicidal Thoughts:  No  Homicidal Thoughts:  No  Memory:  WNL  Judgement:  Good  Insight:  Good  Psychomotor Activity:  unable to assess  Concentration:  Concentration: Good  Recall:  Good  Fund of Knowledge: Good  Language: Good  Assets:  Desire for Improvement  ADL's:  Intact  Cognition: WNL  Prognosis:  Good    DIAGNOSES:    ICD-10-CM   1. Grief  F43.21   2. Major depressive disorder, recurrent episode, moderate (HCC)  F33.1   3. Generalized anxiety disorder  F41.1     Receiving Psychotherapy: Yes  [facility]   RECOMMENDATIONS:  PDMP reviewed. I spent 30 minutes with her. We discussed several different options including Lamictal which I strongly recommend but she is fearful of the side effects.  Another thought would be to wean off the Paxil and start Pristiq.  Another idea is lithium.  We talked about all of these options and patient would like for me to discuss with Dr. Clovis Pu and get his opinion before she agrees to any of them.  I will discuss it with him and either our nurse or I will call her next week with his recommendations. Continue Paxil 60 mg p.o. daily. Continue Klonopin 0.5mg  1 po tid prn.  Continue therapy,  [facility], [place] Return in 6 weeks.  Donnal Moat, PA-C

## 2019-07-08 ENCOUNTER — Telehealth: Payer: Self-pay

## 2019-07-08 NOTE — Telephone Encounter (Signed)
I called Tiffany Horn left a message last night around 6-6:30pm.

## 2019-07-08 NOTE — Telephone Encounter (Signed)
Tiffany Horn called to try to talk with you. She knows that you called yesterday and she missed your call.  She wants you to call her at the end of the day on Thursday or Monday, as she works until 1:00. I told her you had full schedules both days, but that I would send you another message. I also relayed this information to [place].

## 2019-07-09 ENCOUNTER — Telehealth: Payer: Self-pay | Admitting: Physician Assistant

## 2019-07-09 NOTE — Telephone Encounter (Signed)
Pt following up on a conversation concerning a new med you were discussing with the Dr.

## 2019-07-10 NOTE — Telephone Encounter (Signed)
Please tell her I am sorry it took a few extra days to get back with her.  I did discuss this with Dr. Clovis Pu and he recommends as a first option Abilify 2 mg every morning.  The second option would be to add lithium which she and I did discuss.  I will send in whichever she chooses.  I do think the Abilify would be a good add on and not to change the Paxil at this point after he and I discussed it.

## 2019-07-10 NOTE — Telephone Encounter (Signed)
LM to call back tomorrow morning after 9 am or I would try to reach her to discuss options.

## 2019-07-11 ENCOUNTER — Other Ambulatory Visit: Payer: Self-pay | Admitting: Physician Assistant

## 2019-07-11 MED ORDER — ARIPIPRAZOLE 2 MG PO TABS
2.0000 mg | ORAL_TABLET | Freq: Every morning | ORAL | 1 refills | Status: DC
Start: 2019-07-11 — End: 2019-08-06

## 2019-07-11 NOTE — Telephone Encounter (Signed)
Prescription sent

## 2019-07-25 ENCOUNTER — Other Ambulatory Visit: Payer: Self-pay

## 2019-07-25 ENCOUNTER — Ambulatory Visit (INDEPENDENT_AMBULATORY_CARE_PROVIDER_SITE_OTHER): Payer: BC Managed Care – PPO

## 2019-07-25 ENCOUNTER — Encounter: Payer: Self-pay | Admitting: Primary Care

## 2019-07-25 ENCOUNTER — Ambulatory Visit: Payer: BC Managed Care – PPO | Admitting: Primary Care

## 2019-07-25 VITALS — BP 130/84 | HR 86 | Temp 98.0°F | Ht 66.0 in | Wt 181.2 lb

## 2019-07-25 DIAGNOSIS — R0602 Shortness of breath: Secondary | ICD-10-CM

## 2019-07-25 DIAGNOSIS — J4520 Mild intermittent asthma, uncomplicated: Secondary | ICD-10-CM | POA: Diagnosis not present

## 2019-07-25 NOTE — Progress Notes (Signed)
@Patient  ID: Tiffany Horn, female    DOB: Jun 23, 1960, 59 y.o.   MRN: [id number]  Chief Complaint  Patient presents with  . Follow-up    Referring provider: [name], DO  HPI: 59 year old female, never smoked. PMH significant for dyspnea on exertion, hypothyroidism, hyperlipidemia, vitamin D deficiency. Patient of Dr. Valeta Harms, last seen on 10/10/17. Respiratory allergy panel positive, IgE elevate and Eos 200. Maintained on PRN albuterol, Singulair and Zyrtec.   Previous [facility] pulmonary encounters: For the past several years she routinely gets bronchitis. She has been hospitalized for pneumonia in the past in [place], [place] about 3 years ago. 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 She travels a lot back and forth from to her mothers house.   She gets bronchitis at least 2 times per year, always in Oct or march and April. She is usually treated with levaquin and stays away from prednisone if possible because it make her irritable. This has been going on and off for at least 30 years. No known allergies, never seen an allergist. Routinely has a deep cough for the past month. Cool and out of the heat and humidity make it better. Going up and down the steps and exertion make it worse. Out side in the heat makes her cough and sob worse. Routinely wheezes. 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   Pets: 12 dogs and 1 cat, combination of in and out Allergies: unknown Occupation: retired Surveyor, quantity: lives alone, no heavy dust or fume exposure, cleans every day with [employer] cleaning solution  Travel: no recent international travel   OV: 10/10/2017  Feeling better since her last visit. With significant exertion she feels winded. When carrying heavy stuff she feels SOB. She is using her albuterol still a few times per day. She has cough, dry and feels fatigued. She does have GERD and occasionally taking her PPI. She only has been using it as  needed.  Overall it seems as if her symptomatology is not fully controlled.  She does admit to occasional nocturnal symptoms, nocturnal cough.  Denies fevers, sputum production.  07/25/2019 She presents today for regular follow-up. She has baseline shortness of breath. Uses albuterol inahler once daily with noticeable improvement in her dyspnea. She also has intermittent dry cough, chest tightness and wheezing. Continues Singulair and takes Zyretc. She had previously been on Symbicort, ran out and didn't refill because her symptoms were well controlled and she didn't notice a significant difference on this. She is more active now. Feels her symptoms are well controlled on Albuterol prn.    Allergies  Allergen Reactions  . Amoxicillin Diarrhea  . Seroquel [Quetiapine Fumarate]     Unknown    Immunization History  Administered Date(s) Administered  . Influenza Inj Mdck Quad Pf 10/26/2018  . Influenza,inj,Quad PF,6+ Mos 10/19/2014, 10/01/2018  . Influenza,inj,quad, With Preservative 11/03/2016, 11/18/2017  . Influenza-Unspecified 11/02/2016  . Moderna SARS-COVID-2 Vaccination 04/07/2019, 05/13/2019  . Pneumococcal Conjugate-13 11/18/2017  . Tdap 01/30/2014  . Zoster Recombinat (Shingrix) 06/18/2019    Past Medical History:  Diagnosis Date  . Anxiety   . Arthritis    knees and spine, shoulder  . Asthma   . Bronchitis    hx - recurrent  . Complication of anesthesia    waking up is not easy  . COVID-19   . Depression   . Elevated IgE level 09/12/2017   09/12/2017  IgE 195  . H/O miscarriage, not currently pregnant   . Hx of irritable bowel syndrome    x2  . Hyperlipidemia    diet controlled - no medication  . Hypertension    not taking any meds at present - under control per patient  . Hypokalemia    with PNA admission (2.5)  . Migraines   . Pneumonia    4 episodes; hosp. admission 2014  . PONV (postoperative nausea and vomiting)   . Thyroid disease     Tobacco  History: Social History   Tobacco Use  Smoking Status Never Smoker  Smokeless Tobacco Never Used   Counseling given: Not Answered   Outpatient Medications Prior to Visit  Medication Sig Dispense Refill  . albuterol (VENTOLIN HFA) 108 (90 Base) MCG/ACT inhaler Inhale 2 puffs into the lungs every 6 (six) hours as needed for wheezing or shortness of breath. 6.7 g 5  . alendronate (FOSAMAX) 70 MG tablet Take 1 tablet (70 mg total) by mouth every 7 (seven) days. Take with a full glass of water on an empty stomach. 4 tablet 11  . ARIPiprazole (ABILIFY) 2 MG tablet Take 1 tablet (2 mg total) by mouth in the morning. 30 tablet 1  . atorvastatin (LIPITOR) 20 MG tablet Take 1 tablet (20 mg total) by mouth daily. 90 tablet 3  . clonazePAM (KLONOPIN) 0.5 MG tablet 1 po tid prn 90 tablet 2  . levothyroxine (SYNTHROID) 112 MCG tablet Take 1 tablet (112 mcg total) by mouth daily. 90 tablet 3  . methocarbamol (ROBAXIN) 500 MG tablet Take 1 tablet (500 mg total) by mouth 4 (four) times daily as needed for muscle spasms. 40 tablet 0  . montelukast (SINGULAIR) 10 MG tablet Take 1 tablet (10 mg total) by mouth at bedtime. 30 tablet 11  . PARoxetine (PAXIL) 40 MG tablet TAKE 1 TABLET BY MOUTH EVERY DAY (Patient taking differently: 60 mg. ) 90 tablet 1  . rizatriptan (MAXALT) 5 MG tablet Take 1 tablet (5 mg total) by mouth as needed for migraine. May repeat in 2 hours if needed 10 tablet 5  . Spacer/Aero-Holding Chambers (AEROCHAMBER MV) inhaler Use as instructed 1 each 2  . Vitamin D, Ergocalciferol, (DRISDOL) 1.25 MG (50000 UNIT) CAPS capsule Take 1 capsule (50,000 Units total) by mouth every 7 (seven) days. 12 capsule 0   No facility-administered medications prior to visit.    Review of Systems  Review of Systems  Constitutional: Negative.   HENT: Negative.   Respiratory: Positive for cough.        DOE  Cardiovascular: Negative.    Physical Exam  BP 130/84 (BP Location: Left Arm, Cuff Size:  Normal)   Pulse 86   Temp 98 F (36.7 C) (Oral)   Ht 5\' 6"  (1.676 m)   Wt 181 lb 3.2 oz (82.2 kg)   LMP 01/31/2007   SpO2 97% Comment: RA  BMI 29.25 kg/m  Physical Exam Constitutional:      Appearance: Normal appearance.  HENT:     Mouth/Throat:     Mouth: Mucous membranes are moist.     Pharynx: Oropharynx is clear.  Cardiovascular:     Rate and Rhythm: Normal rate and regular rhythm.  Pulmonary:     Effort: Pulmonary effort is normal.     Breath sounds: Normal breath sounds. No wheezing or rhonchi.  Neurological:     General: No focal deficit present.     Mental Status: She is alert and oriented to  person, place, and time. Mental status is at baseline.  Psychiatric:        Mood and Affect: Mood normal.        Behavior: Behavior normal.        Thought Content: Thought content normal.        Judgment: Judgment normal.      Lab Results:  CBC    Component Value Date/Time   WBC 6.3 06/06/2019 1435   RBC 4.85 06/06/2019 1435   HGB 14.9 06/06/2019 1435   HCT 43.8 06/06/2019 1435   PLT 251 06/06/2019 1435   MCV 90.3 06/06/2019 1435   MCV 91.9 03/14/2013 1042   MCH 30.7 06/06/2019 1435   MCHC 34.0 06/06/2019 1435   RDW 12.2 06/06/2019 1435   LYMPHSABS 2.4 09/07/2017 1652   MONOABS 0.5 09/07/2017 1652   EOSABS 0.2 09/07/2017 1652   BASOSABS 0.1 09/07/2017 1652    BMET    Component Value Date/Time   NA 140 06/06/2019 1435   K 3.9 06/06/2019 1435   CL 106 06/06/2019 1435   CO2 23 06/06/2019 1435   GLUCOSE 91 06/06/2019 1435   BUN 11 06/06/2019 1435   CREATININE 0.92 06/06/2019 1435   CALCIUM 9.3 06/06/2019 1435   GFRNONAA >60 08/27/2018 1339   GFRNONAA 76 09/29/2016 1537   GFRAA >60 08/27/2018 1339   GFRAA 87 09/29/2016 1537    BNP No results found for: BNP  ProBNP No results found for: PROBNP  Imaging: No results found.   Assessment & Plan:   Mild intermittent asthma - Stable; She is doing well, her asthma symptoms are well controlled with  SABA. She uses this once daily with improvement. Elevated IgE >100 and eosinophils 200.  - Continue Albuterol HFA 2 puffs every 4-6 hours for shortness of breath/wheezing - Continue Zyrtec qd and Singulair 10mg  at bedtime   - Orders: CXR re: shortness of breath/asthma  - Follow-up: 1 year with Dr. Shirley Friar or sooner if symptoms worsen      Martyn Ehrich, NP 07/25/2019

## 2019-07-25 NOTE — Assessment & Plan Note (Addendum)
-   Stable; She is doing well, her asthma symptoms are well controlled with SABA. She uses this once daily with improvement. Elevated IgE >100 and eosinophils 200.  - Continue Albuterol HFA 2 puffs every 4-6 hours for shortness of breath/wheezing - Continue Zyrtec qd and Singulair 10mg  at bedtime   - Orders: CXR re: shortness of breath/asthma  - Follow-up: 1 year with Dr. Shirley Friar or sooner if symptoms worsen

## 2019-07-25 NOTE — Patient Instructions (Signed)
Recommendations: Continue Albuterol rescue inhaler 2 puffs every 4-6 hours for shortness of breath/wheezing Continue Zyrtec once daily in the morning Continue Singulair 10mg  at bedtime    Orders: CXR re: shortness of breath/asthma   Follow-up: 1 year with Dr. Valeta Harms or App   http://[email or web address].org/conditions-and-treatments/asthma">  Asthma, Adult  Asthma is a long-term (chronic) condition that causes recurrent episodes in which the airways become tight and narrow. The airways are the passages that lead from the nose and mouth down into the lungs. Asthma episodes, also called asthma attacks, can cause coughing, wheezing, shortness of breath, and chest pain. The airways can also fill with mucus. During an attack, it can be difficult to breathe. Asthma attacks can range from minor to life threatening. Asthma cannot be cured, but medicines and lifestyle changes can help control it and treat acute attacks. What are the causes? This condition is believed to be caused by inherited (genetic) and environmental factors, but its exact cause is not known. There are many things that can bring on an asthma attack or make asthma symptoms worse (triggers). Asthma triggers are different for each person. Common triggers include:  Mold.  Dust.  Cigarette smoke.  Cockroaches.  Things that can cause allergy symptoms (allergens), such as animal dander or pollen from trees or grass.  Air pollutants such as household cleaners, wood smoke, smog, or Advertising account planner.  Cold air, weather changes, and winds (which increase molds and pollen in the air).  Strong emotional expressions such as crying or laughing hard.  Stress.  Certain medicines (such as aspirin) or types of medicines (such as beta-blockers).  Sulfites in foods and drinks. Foods and drinks that may contain sulfites include dried fruit, potato chips, and sparkling grape juice.  Infections or inflammatory conditions such as the flu, a cold, or  inflammation of the nasal membranes (rhinitis).  Gastroesophageal reflux disease (GERD).  Exercise or strenuous activity. What are the signs or symptoms? Symptoms of this condition may occur right after asthma is triggered or many hours later. Symptoms include:  Wheezing. This can sound like whistling when you breathe.  Excessive nighttime or early morning coughing.  Frequent or severe coughing with a common cold.  Chest tightness.  Shortness of breath.  Tiredness (fatigue) with minimal activity. How is this diagnosed? This condition is diagnosed based on:  Your medical history.  A physical exam.  Tests, which may include: ? Lung function studies and pulmonary studies (spirometry). These tests can evaluate the flow of air in your lungs. ? Allergy tests. ? Imaging tests, such as X-rays. How is this treated? There is no cure for this condition, but treatment can help control your symptoms. Treatment for asthma usually involves:  Identifying and avoiding your asthma triggers.  Using medicines to control your symptoms. Generally, two types of medicines are used to treat asthma: ? Controller medicines. These help prevent asthma symptoms from occurring. They are usually taken every day. ? Fast-acting reliever or rescue medicines. These quickly relieve asthma symptoms by widening the narrow and tight airways. They are used as needed and provide short-term relief.  Using supplemental oxygen. This may be needed during a severe episode.  Using other medicines, such as: ? Allergy medicines, such as antihistamines, if your asthma attacks are triggered by allergens. ? Immune medicines (immunomodulators). These are medicines that help control the immune system.  Creating an asthma action plan. An asthma action plan is a written plan for managing and treating your asthma attacks. This plan includes: ? A  list of your asthma triggers and how to avoid them. ? Information about when  medicines should be taken and when their dosage should be changed. ? Instructions about using a device called a peak flow meter. A peak flow meter measures how well the lungs are working and the severity of your asthma. It helps you monitor your condition. Follow these instructions at home: Controlling your home environment Control your home environment in the following ways to help avoid triggers and prevent asthma attacks:  Change your heating and air conditioning filter regularly.  Limit your use of fireplaces and wood stoves.  Get rid of pests (such as roaches and mice) and their droppings.  Throw away plants if you see mold on them.  Clean floors and dust surfaces regularly. Use unscented cleaning products.  Try to have someone else vacuum for you regularly. Stay out of rooms while they are being vacuumed and for a short while afterward. If you vacuum, use a dust mask from a hardware store, a double-layered or microfilter vacuum cleaner bag, or a vacuum cleaner with a HEPA filter.  Replace carpet with wood, tile, or vinyl flooring. Carpet can trap dander and dust.  Use allergy-proof pillows, mattress covers, and box spring covers.  Keep your bedroom a trigger-free room.  Avoid pets and keep windows closed when allergens are in the air.  Wash beddings every week in hot water and dry them in a dryer.  Use blankets that are made of polyester or cotton.  Clean bathrooms and kitchens with bleach. If possible, have someone repaint the walls in these rooms with mold-resistant paint. Stay out of the rooms that are being cleaned and painted.  Wash your hands often with soap and water. If soap and water are not available, use hand sanitizer.  Do not allow anyone to smoke in your home. General instructions  Take over-the-counter and prescription medicines only as told by your health care provider. ? Speak with your health care provider if you have questions about how or when to take  the medicines. ? Make note if you are requiring more frequent dosages.  Do not use any products that contain nicotine or tobacco, such as cigarettes and e-cigarettes. If you need help quitting, ask your health care provider. Also, avoid being exposed to secondhand smoke.  Use a peak flow meter as told by your health care provider. Record and keep track of the readings.  Understand and use the asthma action plan to help minimize, or stop an asthma attack, without needing to seek medical care.  Make sure you stay up to date on your yearly vaccinations as told by your health care provider. This may include vaccines for the flu and pneumonia.  Avoid outdoor activities when allergen counts are high and when air quality is low.  Wear a ski mask that covers your nose and mouth during outdoor winter activities. Exercise indoors on cold days if you can.  Warm up before exercising, and take time for a cool-down period after exercise.  Keep all follow-up visits as told by your health care provider. This is important. Where to find more information  For information about asthma, turn to the Centers for Disease Control and Prevention at http://www.clark.net/.htm  For air quality information, turn to [employer] at WeightRating.nl Contact a health care provider if:  You have wheezing, shortness of breath, or a cough even while you are taking medicine to prevent attacks.  The mucus you cough up (sputum) is thicker than usual.  Your sputum changes from clear or white to yellow, green, gray, or bloody.  Your medicines are causing side effects, such as a rash, itching, swelling, or trouble breathing.  You need to use a reliever medicine more than 2-3 times a week.  Your peak flow reading is still at 50-79% of your personal best after following your action plan for 1 hour.  You have a fever. Get help right away if:  You are getting worse and do not respond to treatment during an asthma  attack.  You are short of breath when at rest or when doing very little physical activity.  You have difficulty eating, drinking, or talking.  You have chest pain or tightness.  You develop a fast heartbeat or palpitations.  You have a bluish color to your lips or fingernails.  You are light-headed or dizzy, or you faint.  Your peak flow reading is less than 50% of your personal best.  You feel too tired to breathe normally. Summary  Asthma is a long-term (chronic) condition that causes recurrent episodes in which the airways become tight and narrow. These episodes can cause coughing, wheezing, shortness of breath, and chest pain.  Asthma cannot be cured, but medicines and lifestyle changes can help control it and treat acute attacks.  Make sure you understand how to avoid triggers and how and when to use your medicines.  Asthma attacks can range from minor to life threatening. Get help right away if you have an asthma attack and do not respond to treatment with your usual rescue medicines. This information is not intended to replace advice given to you by your health care provider. Make sure you discuss any questions you have with your health care provider. Document Revised: 03/21/2018 Document Reviewed: 02/21/2016 Elsevier Patient Education  2020 [employer].

## 2019-07-28 NOTE — Progress Notes (Signed)
Please let patient know her CXR showed clear lungs, small soft tissue density left of mediastinum which is stable since 2016 and likely a tiny hiatal hernia

## 2019-07-30 ENCOUNTER — Telehealth: Payer: Self-pay | Admitting: Primary Care

## 2019-07-30 DIAGNOSIS — R062 Wheezing: Secondary | ICD-10-CM

## 2019-07-30 DIAGNOSIS — R059 Cough, unspecified: Secondary | ICD-10-CM

## 2019-07-30 NOTE — Telephone Encounter (Signed)
Patient called to answer questions regarding recent CXR, message left.

## 2019-07-31 MED ORDER — MONTELUKAST SODIUM 10 MG PO TABS: 10.0000 mg | ORAL_TABLET | Freq: Every day | ORAL | 11 refills | Status: DC

## 2019-07-31 NOTE — Telephone Encounter (Signed)
Called and left patient a detailed vm and requested a call back regarding a maintenance inhaler after checking with her insurance company to see what is covered.  Relayed message regarding CT scan.  Patient had her last PFT in September of 2019.  Will await a return call from patient.

## 2019-07-31 NOTE — Telephone Encounter (Signed)
Pt returning call.  [phone]

## 2019-07-31 NOTE — Telephone Encounter (Signed)
No clinical indication at this time for CT scan. Her CXR showed clear lungs. If she is needing to use her Albuterol daily then recommend her going back on ICS/LABA such as Symbicort, Dulera or Breo. She can check with her insurance and see which one is covered or we can send a prescription in for one  If she hasn't had PFTs in 2 years we can place an order for these

## 2019-07-31 NOTE — Telephone Encounter (Signed)
Patient called regarding her cxr results.  Review results with her.  She is wondering if she could benefit from a CT scan or an [place] since she went so long untreated for her asthma.  She is using her Albuterol inhaler twice a day every day.  She states she has use her inhaler after she has gone up and and down the stairs.  The heat and humidity have been a factor.  Beth do you think she could benefit from a maintenance inhaler?  Please advise.  Thank you.

## 2019-08-03 ENCOUNTER — Other Ambulatory Visit: Payer: Self-pay | Admitting: Physician Assistant

## 2019-08-03 NOTE — Progress Notes (Signed)
PCCM:  Thanks for seeing her Garner Nash, DO [facility] 08/03/2019 6:10 PM

## 2019-08-06 ENCOUNTER — Other Ambulatory Visit: Payer: Self-pay | Admitting: Family Medicine

## 2019-08-06 ENCOUNTER — Encounter: Payer: Self-pay | Admitting: Orthopaedic Surgery

## 2019-08-06 ENCOUNTER — Other Ambulatory Visit: Payer: Self-pay

## 2019-08-06 ENCOUNTER — Ambulatory Visit: Payer: BC Managed Care – PPO | Admitting: Orthopaedic Surgery

## 2019-08-06 ENCOUNTER — Ambulatory Visit: Payer: Self-pay

## 2019-08-06 DIAGNOSIS — M25512 Pain in left shoulder: Secondary | ICD-10-CM | POA: Diagnosis not present

## 2019-08-06 DIAGNOSIS — G8929 Other chronic pain: Secondary | ICD-10-CM | POA: Diagnosis not present

## 2019-08-06 MED ORDER — METHOCARBAMOL 500 MG PO TABS: 500.0000 mg | ORAL_TABLET | Freq: Four times a day (QID) | ORAL | 1 refills | Status: DC | PRN

## 2019-08-06 MED ORDER — MELOXICAM 15 MG PO TABS: 15.0000 mg | ORAL_TABLET | Freq: Every day | ORAL | 3 refills | Status: DC

## 2019-08-06 NOTE — Progress Notes (Signed)
Office Visit Note   Patient: Tiffany Horn           Date of Birth: [birth year]           MRN: [id number] Visit Date: 08/06/2019              Requested by: [name], DO [address] [place],  [place] [address] PCP: [name], DO   Assessment & Plan: Visit Diagnoses:  1. Chronic left shoulder pain     Plan: I went over the patient's x-rays with her in detail.  We went over her original injury films and the interoperative x-rays combined with the x-rays today.  She has developed certainly posttraumatic arthritis with time.  At this point 1 treatment recommendation will be considering sending her to a shoulder specialist that would remove the hardware and perform a shoulder replacement.  Right now and try to stay conservative and start her on meloxicam as an anti-inflammatory orally as well as a topical Voltaren gel.  She is taking methocarbamol on occasion nothing is reasonable.  She let us know that if he gets worse she will then seek other surgical treatment options.  Otherwise, follow-up will be as needed.  All questions and concerns were answered and addressed.  Follow-Up Instructions: Return if symptoms worsen or fail to improve.   Orders:  Orders Placed This Encounter  Procedures  . XR Shoulder Left   Meds ordered this encounter  Medications  . meloxicam (MOBIC) 15 MG tablet    Sig: Take 1 tablet (15 mg total) by mouth daily.    Dispense:  30 tablet    Refill:  3  . methocarbamol (ROBAXIN) 500 MG tablet    Sig: Take 1 tablet (500 mg total) by mouth 4 (four) times daily as needed for muscle spasms.    Dispense:  60 tablet    Refill:  1      Procedures: No procedures performed   Clinical Data: No additional findings.   Subjective: Chief Complaint  Patient presents with  . Left Shoulder - Pain  The patient is someone who experienced a comminuted left proximal humerus fracture in 2015.  She underwent open reduction/internal fixation of this fracture.  She  has had shoulder stiffness and pain with certain movements and an uncomfortable feeling for some time now.  It has been getting slowly worse with time but decreased strength and decreased motion.  She has been to therapy in the shoulder and still works on moving on a daily basis and exercising her shoulder.  HPI  Review of Systems She currently denies any headache, chest pain, shortness of breath, fever, chills, nausea, vomiting  Objective: Vital Signs: LMP 01/31/2007   Physical Exam She is alert and orient x3 and in no acute distress Ortho Exam Examination of her left shoulder shows a well-healed surgical incision.  She lacks full overhead motion and full external rotation by few degrees.  There are some grinding of the glenohumeral joint but no clicking. Specialty Comments:  No specialty comments available.  Imaging: XR Shoulder Left  Result Date: 08/06/2019 3 views of left shoulder shows significant posttraumatic arthritic changes of the glenohumeral joint and hardware from previous surgery from fixation of a comminuted proximal humerus fracture.    PMFS History: Patient Active Problem List   Diagnosis Date Noted  . Mild intermittent asthma 07/25/2019  . Osteoporosis 06/12/2019  . Strain of thoracic back region 06/06/2019  . Elevated LFTs 06/06/2019  . Hematuria 06/06/2019  .  Frequent urination at night 06/06/2019  . Elevated IgE level 09/12/2017  . 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 Dizziness 09/29/2016  . Headache, migraine 02/15/2015  . Vitamin D deficiency 10/19/2014  . Encounter for preventive health examination 10/19/2014  . Depression with anxiety 09/02/2014  . Dyspnea on exertion 08/26/2014  . Hyperlipidemia 08/18/2014  . Hypothyroidism 08/18/2014  . Obesity (BMI 30-39.9) 08/18/2014   Past Medical History:  Diagnosis Date  . Anxiety   . Arthritis    knees and spine, shoulder  . Asthma   . Bronchitis    hx - recurrent  . Complication  of anesthesia    waking up is not easy  . COVID-19   . Depression   . Elevated IgE level 09/12/2017   09/12/2017 IgE 195  . H/O miscarriage, not currently pregnant   . Hx of irritable bowel syndrome    x2  . Hyperlipidemia    diet controlled - no medication  . Hypertension    not taking any meds at present - under control per patient  . Hypokalemia    with PNA admission (2.5)  . Migraines   . Pneumonia    4 episodes; hosp. admission 2014  . PONV (postoperative nausea and vomiting)   . Thyroid disease     Family History  Problem Relation Age of Onset  . Breast cancer Mother   . Diabetes Mother   . Heart disease Mother   . Diabetes Father   . Dementia Father   . Colon cancer Father   . Stroke Paternal Grandmother   . Diabetes Paternal Grandmother     Past Surgical History:  Procedure Laterality Date  . BREAST SURGERY     implants, then had them removed  . COLONOSCOPY     greater 10 yrs ago - ? [place]    . LAPAROSCOPIC ABDOMINAL EXPLORATION  1994   endometriosis  . ORIF HUMERUS FRACTURE Left 04/01/2013   [facility] - shoulder  . ORIF HUMERUS FRACTURE Left 04/01/2013   Procedure: OPEN REDUCTION INTERNAL FIXATION (ORIF) LEFT PROXIMAL HUMERUS FRACTURE;  Surgeon: Mcarthur Rossetti, MD;  Location: [facility];  Service: Orthopedics;  Laterality: Left;  . TONSILLECTOMY AND ADENOIDECTOMY     Social History   Occupational History  . Occupation: Control and instrumentation engineer  Tobacco Use  . Smoking status: Never Smoker  . Smokeless tobacco: Never Used  Vaping Use  . Vaping Use: Never used  Substance and Sexual Activity  . Alcohol use: Not Currently  . Drug use: No  . Sexual activity: Not Currently    Partners: Male    Birth control/protection: Post-menopausal

## 2019-08-07 NOTE — Telephone Encounter (Signed)
Spoke with the pt and notified of response per [facility]  She verbalized understanding  She will call and check with insurance  She has had both covid vaccines and will want to schedule PFT, however did not want to schedule right now bc she did not have her schedule available  Will await her call back

## 2019-08-13 ENCOUNTER — Other Ambulatory Visit: Payer: Self-pay | Admitting: Physician Assistant

## 2019-08-14 NOTE — Telephone Encounter (Signed)
Last Rx sent is 40 mg, office note has 60 mg?

## 2019-08-20 ENCOUNTER — Ambulatory Visit: Payer: BC Managed Care – PPO

## 2019-08-22 ENCOUNTER — Ambulatory Visit: Payer: BC Managed Care – PPO | Admitting: Family Medicine

## 2019-08-28 ENCOUNTER — Ambulatory Visit: Payer: BC Managed Care – PPO | Admitting: Family Medicine

## 2019-09-03 ENCOUNTER — Ambulatory Visit: Payer: BC Managed Care – PPO | Admitting: Family Medicine

## 2019-09-03 ENCOUNTER — Other Ambulatory Visit: Payer: Self-pay

## 2019-09-03 ENCOUNTER — Encounter: Payer: Self-pay | Admitting: Family Medicine

## 2019-09-03 VITALS — BP 131/86 | HR 71 | Temp 98.6°F | Resp 18 | Ht 66.0 in | Wt 185.0 lb

## 2019-09-03 DIAGNOSIS — E039 Hypothyroidism, unspecified: Secondary | ICD-10-CM

## 2019-09-03 DIAGNOSIS — Z79899 Other long term (current) drug therapy: Secondary | ICD-10-CM

## 2019-09-03 DIAGNOSIS — M810 Age-related osteoporosis without current pathological fracture: Secondary | ICD-10-CM | POA: Diagnosis not present

## 2019-09-03 DIAGNOSIS — E782 Mixed hyperlipidemia: Secondary | ICD-10-CM

## 2019-09-03 DIAGNOSIS — F418 Other specified anxiety disorders: Secondary | ICD-10-CM | POA: Diagnosis not present

## 2019-09-03 DIAGNOSIS — Z23 Encounter for immunization: Secondary | ICD-10-CM

## 2019-09-03 DIAGNOSIS — Z5181 Encounter for therapeutic drug level monitoring: Secondary | ICD-10-CM

## 2019-09-03 DIAGNOSIS — J4520 Mild intermittent asthma, uncomplicated: Secondary | ICD-10-CM

## 2019-09-03 DIAGNOSIS — G43909 Migraine, unspecified, not intractable, without status migrainosus: Secondary | ICD-10-CM

## 2019-09-03 DIAGNOSIS — Z8249 Family history of ischemic heart disease and other diseases of the circulatory system: Secondary | ICD-10-CM

## 2019-09-03 LAB — HEPATIC FUNCTION PANEL
ALT: 25 U/L (ref 0–35)
AST: 18 U/L (ref 0–37)
Albumin: 4.3 g/dL (ref 3.5–5.2)
Alkaline Phosphatase: 61 U/L (ref 39–117)
Bilirubin, Direct: 0.1 mg/dL (ref 0.0–0.3)
Total Bilirubin: 0.6 mg/dL (ref 0.2–1.2)
Total Protein: 6.7 g/dL (ref 6.0–8.3)

## 2019-09-03 LAB — VITAMIN D 25 HYDROXY (VIT D DEFICIENCY, FRACTURES): VITD: 25.95 ng/mL — ABNORMAL LOW (ref 30.00–100.00)

## 2019-09-03 LAB — LIPID PANEL
Cholesterol: 214 mg/dL — ABNORMAL HIGH (ref 0–200)
HDL: 51.9 mg/dL (ref >39.00)
LDL Cholesterol: 131 mg/dL — ABNORMAL HIGH (ref 0–99)
NonHDL: 161.87
Total CHOL/HDL Ratio: 4
Triglycerides: 152 mg/dL — ABNORMAL HIGH (ref 0.0–149.0)
VLDL: 30.4 mg/dL (ref 0.0–40.0)

## 2019-09-03 MED ORDER — RIZATRIPTAN BENZOATE 5 MG PO TABS: 5.0000 mg | ORAL_TABLET | ORAL | 5 refills | Status: DC | PRN

## 2019-09-03 NOTE — Patient Instructions (Addendum)
Great to see you today.  We will call you with lab results.  I have referred you to cardiology as well. They will call you to schedule.

## 2019-09-03 NOTE — Progress Notes (Signed)
This visit occurred during the SARS-CoV-2 public health emergency.  Safety protocols were in place, including screening questions prior to the visit, additional usage of staff PPE, and extensive cleaning of exam room while observing appropriate contact time as indicated for disinfecting solutions.    Patient ID: Tiffany Horn, female  DOB: 1960/12/16, 59 y.o.   MRN: [id number] Patient Care Team    Relationship Specialty Notifications Start End  [name], DO PCP - General Family Medicine  08/18/14   [name], MD Consulting Physician Gastroenterology  05/29/16   [name], CNM Referring Physician Certified Nurse Midwife  05/29/16   Gari Crown, MD Referring Physician Obstetrics and Gynecology  07/17/17   Garner Nash, DO Consulting Physician Pulmonary Disease  06/12/19     Chief Complaint  Patient presents with  . Osteoporosis    Pt states here for labs and shingrix vaccine  . Follow-up    Subjective: Tiffany Horn is a 59 y.o.  Female  present for  Hyperlipidemia/family history of heart disease: Patient presents today for hyperlipidemia follow-up after starting Lipitor 20 mg daily.  She reports she is tolerating Lipitor well.  She is fasting for her labs today.  She has a family history of heart disease in her mother and in her brother.  She would like referral to cardiology today to be proactive in her cardiac health and discuss her concerns of heart disease.  Hypothyroidism: Patient has a history of hyperthyroidism.  She has been noncompliant with medications in the past.  She states she has been taking her thyroid medicine daily since out last appt 3 months ago.   Migraine: Doing well on with maxalt.     Depression screen [facility] 2/9 05/21/2019 03/06/2017 09/29/2016 05/29/2016 09/01/2014  Decreased Interest 3 2 1  0 3  Down, Depressed, Hopeless 3 2 3  0 3  PHQ - 2 Score 6 4 4  0 6  Altered sleeping 1 2 0 - 3  Tired, decreased energy 3 3 3  - 3  Change in  appetite 1 0 0 - 3  Feeling bad or failure about yourself  0 0 0 - 3  Trouble concentrating 0 1 2 - 3  Moving slowly or fidgety/restless 2 0 2 - 0  Suicidal thoughts 0 0 0 - 0  PHQ-9 Score 13 10 11  - 21  Difficult doing work/chores Somewhat difficult - - - -   GAD 7 : Generalized Anxiety Score 03/06/2017 09/29/2016  Nervous, Anxious, on Edge 3 3  Control/stop worrying 2 1  Worry too much - different things 2 0  Trouble relaxing 3 3  Restless 0 3  Easily annoyed or irritable 0 3  Afraid - awful might happen 2 3  Total GAD 7 Score 12 16  Anxiety Difficulty - Very difficult    Immunization History  Administered Date(s) Administered  . Influenza Inj Mdck Quad Pf 10/26/2018  . Influenza,inj,Quad PF,6+ Mos 10/19/2014, 10/01/2018  . Influenza,inj,quad, With Preservative 11/03/2016, 11/18/2017  . Influenza-Unspecified 11/02/2016  . Moderna SARS-COVID-2 Vaccination 04/07/2019, 05/13/2019  . Pneumococcal Conjugate-13 11/18/2017  . Tdap 01/30/2014  . Zoster Recombinat (Shingrix) 06/18/2019, 09/03/2019     Past Medical History:  Diagnosis Date  . Anxiety   . Arthritis    knees and spine, shoulder  . Asthma   . Bronchitis    hx - recurrent  . Complication of anesthesia    waking up is not easy  . COVID-19   . Depression   .  Elevated IgE level 09/12/2017   09/12/2017 IgE 195  . H/O miscarriage, not currently pregnant   . Hx of irritable bowel syndrome    x2  . Hyperlipidemia    diet controlled - no medication  . Hypertension    not taking any meds at present - under control per patient  . Hypokalemia    with PNA admission (2.5)  . Migraines   . Pneumonia    4 episodes; hosp. admission 2014  . PONV (postoperative nausea and vomiting)   . Thyroid disease    Allergies  Allergen Reactions  . Abilify [Aripiprazole] Nausea And Vomiting  . Amoxicillin Diarrhea  . Seroquel [Quetiapine Fumarate]     Unknown   Past Surgical History:  Procedure Laterality Date  . BREAST  SURGERY     implants, then had them removed  . COLONOSCOPY     greater 10 yrs ago - ? [place]    . LAPAROSCOPIC ABDOMINAL EXPLORATION  1994   endometriosis  . ORIF HUMERUS FRACTURE Left 04/01/2013   [facility] - shoulder  . ORIF HUMERUS FRACTURE Left 04/01/2013   Procedure: OPEN REDUCTION INTERNAL FIXATION (ORIF) LEFT PROXIMAL HUMERUS FRACTURE;  Surgeon: Mcarthur Rossetti, MD;  Location: [facility];  Service: Orthopedics;  Laterality: Left;  . TONSILLECTOMY AND ADENOIDECTOMY     Family History  Problem Relation Age of Onset  . Breast cancer Mother   . Diabetes Mother   . Heart disease Mother   . Diabetes Father   . Dementia Father   . Colon cancer Father   . Stroke Paternal Grandmother   . Diabetes Paternal Grandmother    Social History   Social History Narrative   She is originally from [place]. She has traveled to [place], [place], [place], [place], [place], [place], [place], [place], [place]. No international travel. She has dogs. No prior bird, mold, or recent hot tub exposure. She hasn't used her hot tub in 1.5 years. 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 Previously enjoyed gardening and playing tennis. Helps to care for her mother.    Allergies as of 09/03/2019      Reactions   Abilify [aripiprazole] Nausea And Vomiting   Amoxicillin Diarrhea   Seroquel [quetiapine Fumarate]    Unknown      Medication List       Accurate as of September 03, 2019 11:59 PM. If you have any questions, ask your nurse or doctor.        STOP taking these medications   AeroChamber MV inhaler Stopped by: Howard Pouch, DO   ARIPiprazole 2 MG tablet Commonly known as: ABILIFY Stopped by: Howard Pouch, DO   Vitamin D (Ergocalciferol) 1.25 MG (50000 UNIT) Caps capsule Commonly known as: DRISDOL Stopped by: Howard Pouch, DO     TAKE these medications   albuterol 108 (90 Base) MCG/ACT inhaler Commonly known as: VENTOLIN  HFA Inhale 2 puffs into the lungs every 6 (six) hours as needed for wheezing or shortness of breath.   alendronate 70 MG tablet Commonly known as: FOSAMAX Take 1 tablet (70 mg total) by mouth every 7 (seven) days. Take with a full glass of water on an empty stomach.   atorvastatin 40 MG tablet Commonly known as: LIPITOR Take 1 tablet (40 mg total) by mouth daily. What changed:   medication strength  how much to take Changed by: Howard Pouch, DO  clonazePAM 0.5 MG tablet Commonly known as: KLONOPIN 1 po tid prn   levothyroxine 112 MCG tablet Commonly known as: SYNTHROID Take 1 tablet (112 mcg total) by mouth daily.   meloxicam 15 MG tablet Commonly known as: MOBIC Take 1 tablet (15 mg total) by mouth daily.   methocarbamol 500 MG tablet Commonly known as: Robaxin Take 1 tablet (500 mg total) by mouth 4 (four) times daily as needed for muscle spasms.   montelukast 10 MG tablet Commonly known as: SINGULAIR Take 1 tablet (10 mg total) by mouth at bedtime.   PARoxetine 40 MG tablet Commonly known as: PAXIL Take 1 tablet (40 mg total) by mouth daily.   rizatriptan 5 MG tablet Commonly known as: MAXALT Take 1 tablet (5 mg total) by mouth as needed for migraine. May repeat in 2 hours if needed       All past medical history, surgical history, allergies, family history, immunizations andmedications were updated in the EMR today and reviewed under the history and medication portions of their EMR.      ROS: 14 pt review of systems performed and negative (unless mentioned in an HPI)  Objective: BP 131/86 (BP Location: Right Arm, Patient Position: Sitting, Cuff Size: Large)   Pulse 71   Temp 98.6 F (37 C) (Oral)   Resp 18   Ht 5\' 6"  (1.676 m)   Wt 185 lb (83.9 kg)   LMP 01/31/2007   SpO2 96%   BMI 29.86 kg/m  Gen: Afebrile. No acute distress. Nontoxic, pleasant female.  HENT: AT. [place]. No cough or shortness of breath. Eyes:Pupils Equal Round Reactive to light,  Extraocular movements intact,  Conjunctiva without redness, discharge or icterus. CV: RRRno murmur, no edema, +2/4 P posterior tibialis pulses Chest: CTAB, no wheeze or crackles.  Neuro:  Normal gait. PERLA. EOMi. Alert. Oriented x3  Psych: Normal affect, dress and demeanor. Normal speech. Normal thought content and judgment.  No exam data present  Assessment/plan: Tiffany Horn is a 59 y.o. female present for CPE Hypothyroidism, unspecified type Continue levothyrox 112 mcg QD. Refills will be provided in appropriate dose after results received.  - labs UTD Migraine without status migrainosus, not intractable, unspecified migraine type Stable continue Maxalt as needed.  Mixed hyperlipidemia/obesity/FH heart disease (mother and brother) Diet and exercise encouraged.  Tolerating statin.  - lft collected today - Lipid panel> levels improved a great deal but still not at goal.  Increased Lipitor to 40 mg. - referral to cardiology today per her request Osteoporosis, unspecified osteoporosis type, unspecified pathological fracture presence/vit d def Vit d levels collected today She finished high dose Vit d weekly and now is taking OTC daily - but does not know the dose.   Health maintenance: Shingrix No. 2 completed today  No follow-ups on file.  Orders Placed This Encounter  Procedures  . Varicella-zoster vaccine IM (Shingrix)  . Hepatic function panel  . Lipid panel  . Vitamin D (25 hydroxy)  . Ambulatory referral to Cardiology    Meds ordered this encounter  Medications  . rizatriptan (MAXALT) 5 MG tablet    Sig: Take 1 tablet (5 mg total) by mouth as needed for migraine. May repeat in 2 hours if needed    Dispense:  10 tablet    Refill:  5  . atorvastatin (LIPITOR) 40 MG tablet    Sig: Take 1 tablet (40 mg total) by mouth daily.    Dispense:  90 tablet    Refill:  3  Dc prior script dose change. Thanks.    Referral Orders     Ambulatory referral to  Cardiology   Electronically signed by: Howard Pouch, DO [place]

## 2019-09-04 ENCOUNTER — Telehealth: Payer: Self-pay | Admitting: Family Medicine

## 2019-09-04 ENCOUNTER — Other Ambulatory Visit: Payer: Self-pay

## 2019-09-04 MED ORDER — ATORVASTATIN CALCIUM 40 MG PO TABS: 40.0000 mg | ORAL_TABLET | Freq: Every day | ORAL | 3 refills | Status: DC

## 2019-09-04 NOTE — Telephone Encounter (Signed)
Patient advised and voiced understanding. She is currently taking 1000 units of vit D supplement and given recommendation for increasing to 2000 units. Med list updated to reflect changes. Nothing further needed.

## 2019-09-04 NOTE — Telephone Encounter (Signed)
Please inform patient Tiffany Horn liver function is normal. Tiffany Horn vitamin D levels are still low at 25.9-I would encourage Tiffany Horn to increase Tiffany Horn vitamin D supplementation by 1000 units to what ever she is taking now.  Please ask Tiffany Horn the dose she is taking now (she was checking on this for [place]) add the 1000 units to it and then add total recommended dose to Tiffany Horn med list please. Tiffany Horn cholesterol panel has greatly improved with the addition of Lipitor but not quite at goal for Tiffany Horn with Tiffany Horn family history.  Tiffany Horn LDL level was 131, I would recommend we try to get that level closer to LDL of 100 for Tiffany Horn.  I did refill Tiffany Horn Lipitor for Tiffany Horn at the next dose level.  Lipitor 40 mg prescribed.  She can finish the Lipitor 20 mg tabs she has by taking 2 tabs daily-until she picks up the new prescription of 40 mg tabs.  Thanks.  Follow-up for physical yearly.  Last physical 05/21/2019

## 2019-10-19 ENCOUNTER — Other Ambulatory Visit: Payer: Self-pay

## 2019-10-19 ENCOUNTER — Emergency Department (HOSPITAL_BASED_OUTPATIENT_CLINIC_OR_DEPARTMENT_OTHER): Payer: BC Managed Care – PPO

## 2019-10-19 ENCOUNTER — Emergency Department (HOSPITAL_BASED_OUTPATIENT_CLINIC_OR_DEPARTMENT_OTHER)
Admission: EM | Admit: 2019-10-19 | Discharge: 2019-10-19 | Disposition: A | Discharge status: Home / Self Care | Payer: BC Managed Care – PPO | Attending: Emergency Medicine | Admitting: Emergency Medicine

## 2019-10-19 ENCOUNTER — Encounter (HOSPITAL_BASED_OUTPATIENT_CLINIC_OR_DEPARTMENT_OTHER): Payer: Self-pay

## 2019-10-19 DIAGNOSIS — R1032 Left lower quadrant pain: Secondary | ICD-10-CM | POA: Diagnosis present

## 2019-10-19 DIAGNOSIS — E039 Hypothyroidism, unspecified: Secondary | ICD-10-CM | POA: Diagnosis not present

## 2019-10-19 DIAGNOSIS — Z79899 Other long term (current) drug therapy: Secondary | ICD-10-CM | POA: Diagnosis not present

## 2019-10-19 DIAGNOSIS — Z79890 Hormone replacement therapy (postmenopausal): Secondary | ICD-10-CM | POA: Insufficient documentation

## 2019-10-19 DIAGNOSIS — Z8616 Personal history of COVID-19: Secondary | ICD-10-CM | POA: Diagnosis not present

## 2019-10-19 DIAGNOSIS — Z7951 Long term (current) use of inhaled steroids: Secondary | ICD-10-CM | POA: Insufficient documentation

## 2019-10-19 DIAGNOSIS — I10 Essential (primary) hypertension: Secondary | ICD-10-CM | POA: Insufficient documentation

## 2019-10-19 DIAGNOSIS — K529 Noninfective gastroenteritis and colitis, unspecified: Secondary | ICD-10-CM

## 2019-10-19 DIAGNOSIS — J45909 Unspecified asthma, uncomplicated: Secondary | ICD-10-CM | POA: Diagnosis not present

## 2019-10-19 DIAGNOSIS — K5190 Ulcerative colitis, unspecified, without complications: Secondary | ICD-10-CM | POA: Diagnosis not present

## 2019-10-19 LAB — HEPATIC FUNCTION PANEL
ALT: 34 U/L (ref 0–44)
AST: 28 U/L (ref 15–41)
Albumin: 4 g/dL (ref 3.5–5.0)
Alkaline Phosphatase: 54 U/L (ref 38–126)
Bilirubin, Direct: 0.1 mg/dL (ref 0.0–0.2)
Indirect Bilirubin: 0.5 mg/dL (ref 0.3–0.9)
Total Bilirubin: 0.6 mg/dL (ref 0.3–1.2)
Total Protein: 6.9 g/dL (ref 6.5–8.1)

## 2019-10-19 LAB — CBC WITH DIFFERENTIAL/PLATELET
Abs Immature Granulocytes: 0.02 10*3/uL (ref 0.00–0.07)
Basophils Absolute: 0.1 10*3/uL (ref 0.0–0.1)
Basophils Relative: 1 %
Eosinophils Absolute: 0.2 10*3/uL (ref 0.0–0.5)
Eosinophils Relative: 3 %
HCT: 41.4 % (ref 36.0–46.0)
Hemoglobin: 13.8 g/dL (ref 12.0–15.0)
Immature Granulocytes: 0 %
Lymphocytes Relative: 32 %
Lymphs Abs: 1.5 10*3/uL (ref 0.7–4.0)
MCH: 29.5 pg (ref 26.0–34.0)
MCHC: 33.3 g/dL (ref 30.0–36.0)
MCV: 88.5 fL (ref 80.0–100.0)
Monocytes Absolute: 0.5 10*3/uL (ref 0.1–1.0)
Monocytes Relative: 10 %
Neutro Abs: 2.5 10*3/uL (ref 1.7–7.7)
Neutrophils Relative %: 54 %
Platelets: 228 10*3/uL (ref 150–400)
RBC: 4.68 MIL/uL (ref 3.87–5.11)
RDW: 12.2 % (ref 11.5–15.5)
WBC: 4.8 10*3/uL (ref 4.0–10.5)
nRBC: 0 % (ref 0.0–0.2)

## 2019-10-19 LAB — BASIC METABOLIC PANEL
Anion gap: 11 (ref 5–15)
BUN: 10 mg/dL (ref 6–20)
CO2: 22 mmol/L (ref 22–32)
Calcium: 8.7 mg/dL — ABNORMAL LOW (ref 8.9–10.3)
Chloride: 105 mmol/L (ref 98–111)
Creatinine, Ser: 0.75 mg/dL (ref 0.44–1.00)
GFR calc Af Amer: >60 mL/min (ref >60)
GFR calc non Af Amer: >60 mL/min (ref >60)
Glucose, Bld: 105 mg/dL — ABNORMAL HIGH (ref 70–99)
Potassium: 3.7 mmol/L (ref 3.5–5.1)
Sodium: 138 mmol/L (ref 135–145)

## 2019-10-19 LAB — OCCULT BLOOD X 1 CARD TO LAB, STOOL: Fecal Occult Bld: NEGATIVE

## 2019-10-19 LAB — LIPASE, BLOOD: Lipase: 29 U/L (ref 11–51)

## 2019-10-19 MED ORDER — PROCHLORPERAZINE EDISYLATE 10 MG/2ML IJ SOLN
10.0000 mg | Freq: Once | INTRAMUSCULAR | Status: AC
  Administered 2019-10-19: 10 mg via INTRAVENOUS
  Filled 2019-10-19: qty 2

## 2019-10-19 MED ORDER — ONDANSETRON HCL 4 MG/2ML IJ SOLN
4.0000 mg | Freq: Once | INTRAMUSCULAR | Status: AC
  Administered 2019-10-19: 4 mg via INTRAVENOUS
  Filled 2019-10-19: qty 2

## 2019-10-19 MED ORDER — CIPROFLOXACIN HCL 500 MG PO TABS: 500.0000 mg | ORAL_TABLET | Freq: Two times a day (BID) | ORAL | 0 refills | Status: AC

## 2019-10-19 MED ORDER — ONDANSETRON HCL 4 MG PO TABS: 4.0000 mg | ORAL_TABLET | Freq: Three times a day (TID) | ORAL | 0 refills | Status: DC | PRN

## 2019-10-19 MED ORDER — METRONIDAZOLE 500 MG PO TABS: 500.0000 mg | ORAL_TABLET | Freq: Three times a day (TID) | ORAL | 0 refills | Status: AC

## 2019-10-19 MED ORDER — IOHEXOL 300 MG/ML  SOLN
100.0000 mL | Freq: Once | INTRAMUSCULAR | Status: AC | PRN
  Administered 2019-10-19: 100 mL via INTRAVENOUS

## 2019-10-19 MED ORDER — FENTANYL CITRATE (PF) 100 MCG/2ML IJ SOLN
50.0000 ug | Freq: Once | INTRAMUSCULAR | Status: AC
  Administered 2019-10-19: 50 ug via INTRAVENOUS
  Filled 2019-10-19: qty 2

## 2019-10-19 NOTE — ED Notes (Signed)
Given gingerale, states," I feel much better"

## 2019-10-19 NOTE — ED Notes (Signed)
Patient transported to CT 

## 2019-10-19 NOTE — ED Triage Notes (Signed)
Pt arrives with c/o pain in LLQ with nausea and diarrhea. Pt reports decreased appetite and black stool starting this morning.

## 2019-10-19 NOTE — ED Notes (Signed)
Chaperone for rectal exam, Orlando Penner, Therapist, sports for EDP [employer]

## 2019-10-19 NOTE — ED Notes (Signed)
Pt aware urine specimen ordered. Pt reports inability to provide specimen at this time. Specimen collection device provided to patient. 

## 2019-10-19 NOTE — ED Notes (Signed)
Pt reports continued nausea, EDP [facility] notified

## 2019-10-19 NOTE — ED Provider Notes (Signed)
[place] EMERGENCY DEPARTMENT Provider Note   CSN: [id number] Arrival date & time: 10/19/19  1117     History Chief Complaint  Patient presents with  . Abdominal Pain    Tiffany Horn is a 59 y.o. female.  The history is provided by the patient.  Abdominal Pain Pain location:  LLQ Pain quality: aching   Pain radiates to:  Does not radiate Pain severity:  Mild Onset quality:  Gradual Timing:  Intermittent Progression:  Waxing and waning Chronicity:  New Context: not previous surgeries and not suspicious food intake   Relieved by:  Nothing Worsened by:  Nothing Associated symptoms: diarrhea (stool looked darker today)   Associated symptoms: no chest pain, no chills, no cough, no dysuria, no fever, no hematuria, no shortness of breath, no sore throat and no vomiting   Risk factors: no alcohol abuse, no aspirin use, has not had multiple surgeries and no NSAID use        Past Medical History:  Diagnosis Date  . Anxiety   . Arthritis    knees and spine, shoulder  . Asthma   . Bronchitis    hx - recurrent  . Complication of anesthesia    waking up is not easy  . COVID-19   . Depression   . Elevated IgE level 09/12/2017   09/12/2017 IgE 195  . H/O miscarriage, not currently pregnant   . Hx of irritable bowel syndrome    x2  . Hyperlipidemia    diet controlled - no medication  . Hypertension    not taking any meds at present - under control per patient  . Hypokalemia    with PNA admission (2.5)  . Migraines   . Pneumonia    4 episodes; hosp. admission 2014  . PONV (postoperative nausea and vomiting)   . Thyroid disease     Patient Active Problem List   Diagnosis Date Noted  . Family history of heart disease 09/03/2019  . Mild intermittent asthma 07/25/2019  . Osteoporosis 06/12/2019  . Strain of thoracic back region 06/06/2019  . Elevated LFTs 06/06/2019  . Hematuria 06/06/2019  . Frequent urination at night 06/06/2019  . Elevated IgE  level 09/12/2017  . 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 Dizziness 09/29/2016  . Headache, migraine 02/15/2015  . Vitamin D deficiency 10/19/2014  . Encounter for preventive health examination 10/19/2014  . Depression with anxiety 09/02/2014  . Dyspnea on exertion 08/26/2014  . Hyperlipidemia 08/18/2014  . Hypothyroidism 08/18/2014  . Obesity (BMI 30-39.9) 08/18/2014    Past Surgical History:  Procedure Laterality Date  . BREAST SURGERY     implants, then had them removed  . COLONOSCOPY     greater 10 yrs ago - ? [place]    . LAPAROSCOPIC ABDOMINAL EXPLORATION  1994   endometriosis  . ORIF HUMERUS FRACTURE Left 04/01/2013   [facility] - shoulder  . ORIF HUMERUS FRACTURE Left 04/01/2013   Procedure: OPEN REDUCTION INTERNAL FIXATION (ORIF) LEFT PROXIMAL HUMERUS FRACTURE;  Surgeon: Mcarthur Rossetti, MD;  Location: [facility];  Service: Orthopedics;  Laterality: Left;  . TONSILLECTOMY AND ADENOIDECTOMY       OB History    Gravida  2   Para  0   Term  0   Preterm  0   AB  2   Living  0     SAB  2   TAB      Ectopic  Multiple      Live Births              Family History  Problem Relation Age of Onset  . Breast cancer Mother   . Diabetes Mother   . Heart disease Mother   . Diabetes Father   . Dementia Father   . Colon cancer Father   . Stroke Paternal Grandmother   . Diabetes Paternal Grandmother     Social History   Tobacco Use  . Smoking status: Never Smoker  . Smokeless tobacco: Never Used  Vaping Use  . Vaping Use: Never used  Substance Use Topics  . Alcohol use: Yes    Comment: occ   . Drug use: No    Home Medications Prior to Admission medications   Medication Sig Start Date End Date Taking? Authorizing Provider  albuterol (VENTOLIN HFA) 108 (90 Base) MCG/ACT inhaler Inhale 2 puffs into the lungs every 6 (six) hours as needed for wheezing or shortness of breath.  05/21/19   Kuneff, Renee A, DO  alendronate (FOSAMAX) 70 MG tablet Take 1 tablet (70 mg total) by mouth every 7 (seven) days. Take with a full glass of water on an empty stomach. 06/18/19   Kuneff, Renee A, DO  atorvastatin (LIPITOR) 40 MG tablet Take 1 tablet (40 mg total) by mouth daily. 09/04/19   Raoul Pitch, Renee A, DO  clonazePAM (KLONOPIN) 0.5 MG tablet 1 po tid prn 07/04/19   Hurst, Dorothea Glassman, PA-C  levothyroxine (SYNTHROID) 112 MCG tablet Take 1 tablet (112 mcg total) by mouth daily. 05/22/19   Kuneff, Renee A, DO  meloxicam (MOBIC) 15 MG tablet Take 1 tablet (15 mg total) by mouth daily. 08/06/19   Mcarthur Rossetti, MD  methocarbamol (ROBAXIN) 500 MG tablet Take 1 tablet (500 mg total) by mouth 4 (four) times daily as needed for muscle spasms. 08/06/19   Mcarthur Rossetti, MD  montelukast (SINGULAIR) 10 MG tablet Take 1 tablet (10 mg total) by mouth at bedtime. 07/31/19   Martyn Ehrich, NP  PARoxetine (PAXIL) 40 MG tablet Take 1 tablet (40 mg total) by mouth daily. 08/15/19   Donnal Moat T, PA-C  rizatriptan (MAXALT) 5 MG tablet Take 1 tablet (5 mg total) by mouth as needed for migraine. May repeat in 2 hours if needed 09/03/19   Kuneff, Renee A, DO  VITAMIN D PO Take 2,000 Units by mouth daily.    [provider]    Allergies    Abilify [aripiprazole], Amoxicillin, and Seroquel [quetiapine fumarate]  Review of Systems   Review of Systems  Constitutional: Negative for chills and fever.  HENT: Negative for ear pain and sore throat.   Eyes: Negative for pain and visual disturbance.  Respiratory: Negative for cough and shortness of breath.   Cardiovascular: Negative for chest pain and palpitations.  Gastrointestinal: Positive for abdominal pain and diarrhea (stool looked darker today). Negative for vomiting.  Genitourinary: Negative for dysuria and hematuria.  Musculoskeletal: Negative for arthralgias and back pain.  Skin: Negative for color change and rash.  Neurological:  Negative for seizures and syncope.  All other systems reviewed and are negative.   Physical Exam Updated Vital Signs  ED Triage Vitals  Enc Vitals Group     BP 10/19/19 1128 (!) 127/92     Pulse Rate 10/19/19 1128 79     Resp 10/19/19 1128 18     Temp 10/19/19 1128 98.2 F (36.8 C)     Temp Source 10/19/19  1128 Oral     SpO2 10/19/19 1128 99 %     Weight 10/19/19 1128 180 lb (81.6 kg)     Height 10/19/19 1128 5\' 6"  (1.676 m)     Head Circumference --      Peak Flow --      Pain Score 10/19/19 1127 7     Pain Loc --      Pain Edu? --      Excl. in [place]? --     Physical Exam Vitals and nursing note reviewed.  Constitutional:      General: She is not in acute distress.    Appearance: She is well-developed.  HENT:     Head: Normocephalic and atraumatic.     Mouth/Throat:     Mouth: Mucous membranes are moist.  Eyes:     Extraocular Movements: Extraocular movements intact.     Conjunctiva/sclera: Conjunctivae normal.  Cardiovascular:     Rate and Rhythm: Normal rate and regular rhythm.     Heart sounds: Normal heart sounds. No murmur heard.   Pulmonary:     Effort: Pulmonary effort is normal. No respiratory distress.     Breath sounds: Normal breath sounds.  Abdominal:     General: Abdomen is flat.     Palpations: Abdomen is soft.     Tenderness: There is abdominal tenderness in the left lower quadrant.     Hernia: No hernia is present.  Genitourinary:    Rectum: Guaiac result negative (brown stool on exam).  Musculoskeletal:     Cervical back: Neck supple.  Skin:    General: Skin is warm and dry.     Capillary Refill: Capillary refill takes less than 2 seconds.  Neurological:     Mental Status: She is alert.     ED Results / Procedures / Treatments   Labs (all labs ordered are listed, but only abnormal results are displayed) Labs Reviewed  BASIC METABOLIC PANEL - Abnormal; Notable for the following components:      Result Value   Glucose, Bld 105 (*)     Calcium 8.7 (*)    All other components within normal limits  CBC WITH DIFFERENTIAL/PLATELET  HEPATIC FUNCTION PANEL  LIPASE, BLOOD  OCCULT BLOOD X 1 CARD TO LAB, STOOL    EKG EKG Interpretation  Date/Time:  Sunday October 19 2019 11:38:37 EDT Ventricular Rate:  76 PR Interval:    QRS Duration: 98 QT Interval:  409 QTC Calculation: 460 R Axis:   59 Text Interpretation: Sinus rhythm Low voltage, precordial leads Confirmed by Lennice Sites [phone]) on 10/19/2019 12:19:23 PM   Radiology CT ABDOMEN PELVIS W CONTRAST  Result Date: 10/19/2019 CLINICAL DATA:  Suspect left lower quadrant abscess/infection. Nausea and diarrhea 2 days. EXAM: CT ABDOMEN AND PELVIS WITH CONTRAST TECHNIQUE: Multidetector CT imaging of the abdomen and pelvis was performed using the standard protocol following bolus administration of intravenous contrast. CONTRAST:  150mL OMNIPAQUE IOHEXOL 300 MG/ML  SOLN COMPARISON:  12/27/2012 FINDINGS: Lower chest: Lung bases are clear. Hepatobiliary: Liver, gallbladder and biliary tree are normal. Pancreas: Normal. Spleen: Normal. Adrenals/Urinary Tract: Adrenal glands are normal. Kidneys are normal size without hydronephrosis, focal mass or nephrolithiasis. Ureters and bladder are normal. Stomach/Bowel: Stomach and small bowel are normal. Appendix not visualized. No evidence of diverticulosis/diverticulitis. Colon is decompressed over the transverse and descending colon with subtle wall thickening. Findings could be due to underdistention although may be seen with mild acute colitis of infectious or inflammatory nature. Vascular/Lymphatic: Minimal  calcified plaque over the abdominal aorta which is normal in caliber. No evidence of adenopathy. Reproductive: Normal. Other: No free fluid or focal inflammatory change. Musculoskeletal: Mild degenerative change of the spine. There is a moderate compression fracture of T12 age indeterminate but unchanged from chest x-ray 07/25/2019.  IMPRESSION: 1. Subtle wall thickening of the colon which may be due to underdistention, although may be seen with mild acute colitis of infectious or inflammatory nature. 2. Aortic atherosclerosis. Moderate T12 compression fracture age indeterminate but unchanged from chest x-ray 07/25/2019. Aortic Atherosclerosis (ICD10-I70.0). Electronically Signed   By: Marin Olp M.D.   On: 10/19/2019 13:44    Procedures Procedures (including critical care time)  Medications Ordered in ED Medications  ondansetron (ZOFRAN) injection 4 mg (4 mg Intravenous Given 10/19/19 1216)  fentaNYL (SUBLIMAZE) injection 50 mcg (50 mcg Intravenous Given 10/19/19 1216)  iohexol (OMNIPAQUE) 300 MG/ML solution 100 mL (100 mLs Intravenous Contrast Given 10/19/19 1308)  prochlorperazine (COMPAZINE) injection 10 mg (10 mg Intravenous Given 10/19/19 1326)    ED Course  I have reviewed the triage vital signs and the nursing notes.  Pertinent labs & imaging results that were available during my care of the patient were reviewed by me and considered in my medical decision making (see chart for details).    MDM Rules/Calculators/A&P                          Sarenity Ramaker is a 59 year old female with history of irritable bowel syndrome, hypertension, high cholesterol presents the ED with abdominal pain. Normal vitals. No fever. Tenderness in the left lower quadrant with diarrhea. Thought her stool looked black or today. Had brown stool on exam. Hemoccult is negative. No urinary symptoms. Will check basic labs. Will get a CT scan abdomen and pelvis. Suspect diverticulitis versus bowel obstruction versus UTI.  No significant anemia, electrolyte abnormality, kidney injury.  CT scan of abdomen pelvis shows mild colitis.  Will prescribe antibiotics. We will have her follow-up with primary care doctor and GI.  Discharged in good condition.  Understands return precautions.  This chart was dictated using voice recognition software.   Despite best efforts to proofread,  errors can occur which can change the documentation meaning.    Final Clinical Impression(s) / ED Diagnoses Final diagnoses:  Colitis    Rx / DC Orders ED Discharge Orders    None       Lennice Sites, DO 10/19/19 1520

## 2019-10-29 ENCOUNTER — Telehealth: Payer: Self-pay

## 2019-10-29 NOTE — Telephone Encounter (Signed)
Pt states having a bad colitis attack & needs to know what she can take to get some relief.   Pt uses [facility]. Call pt [phone]

## 2019-10-29 NOTE — Telephone Encounter (Signed)
Called and offered 2 appts which were declined. Pt stated she will cb to sched if not feeling better. Pt was advise to go to UC if sx became serve.

## 2019-10-29 NOTE — Telephone Encounter (Signed)
If patient would like evaluation and treatment of her colitis I had be happy to help her.  Please schedule her for appointment.

## 2019-10-29 NOTE — Telephone Encounter (Signed)
Pt recently seen in ED for Colitis do not see where pt has seen you within the last 6 mos. Hx of IBS. Please advise if pt needs appt ?

## 2019-10-31 ENCOUNTER — Other Ambulatory Visit: Payer: Self-pay | Admitting: Physician Assistant

## 2019-10-31 NOTE — Telephone Encounter (Signed)
Please review

## 2019-11-17 ENCOUNTER — Ambulatory Visit: Payer: BC Managed Care – PPO | Admitting: Internal Medicine

## 2019-11-20 ENCOUNTER — Ambulatory Visit: Payer: BC Managed Care – PPO | Admitting: Physician Assistant

## 2019-11-28 ENCOUNTER — Encounter: Payer: Self-pay | Admitting: Family Medicine

## 2019-11-28 ENCOUNTER — Telehealth (INDEPENDENT_AMBULATORY_CARE_PROVIDER_SITE_OTHER): Payer: BC Managed Care – PPO | Admitting: Family Medicine

## 2019-11-28 DIAGNOSIS — G43909 Migraine, unspecified, not intractable, without status migrainosus: Secondary | ICD-10-CM

## 2019-11-28 DIAGNOSIS — J329 Chronic sinusitis, unspecified: Secondary | ICD-10-CM | POA: Diagnosis not present

## 2019-11-28 DIAGNOSIS — B9689 Other specified bacterial agents as the cause of diseases classified elsewhere: Secondary | ICD-10-CM | POA: Diagnosis not present

## 2019-11-28 DIAGNOSIS — E039 Hypothyroidism, unspecified: Secondary | ICD-10-CM

## 2019-11-28 DIAGNOSIS — R059 Cough, unspecified: Secondary | ICD-10-CM | POA: Diagnosis not present

## 2019-11-28 MED ORDER — HYDROCODONE-HOMATROPINE 5-1.5 MG PO TABS
1.0000 | ORAL_TABLET | Freq: Two times a day (BID) | ORAL | 0 refills | Status: DC | PRN
Start: 2019-11-28 — End: 2020-04-28

## 2019-11-28 MED ORDER — DOXYCYCLINE HYCLATE 100 MG PO TABS: 100.0000 mg | ORAL_TABLET | Freq: Two times a day (BID) | ORAL | 0 refills | Status: DC

## 2019-11-28 NOTE — Progress Notes (Signed)
VIRTUAL VISIT VIA VIDEO  I connected with [name] on 12/01/19 at  4:00 PM EDT by elemedicine application and verified that I am speaking with the correct person using two identifiers. Location patient: Home Location provider: [facility], Office Persons participating in the virtual visit: Patient, Dr. Raoul Pitch and Samul Dada, CMA  I discussed the limitations of evaluation and management by telemedicine and the availability of in person appointments. The patient expressed understanding and agreed to proceed.   SUBJECTIVE Chief Complaint  Patient presents with  . Cough    This is a recurrent problem, this episode onset 2 weeks. The problem has been gradually improving. Pt c/o productive cough with sputum of a yellowish tint along with headaches and nasal congestion. Denies chest pain. The symptoms are aggravated by lying down on left side. She has tried prn and maintenance inhaler, OTC cough syrup with moderate relief. Her past medical history is significant for bronchitis, environmental allergies and pneumonia.     HPI: Tiffany Horn is a 59 y.o. female present for cough > 2 weeks.  Patient reports she has had a cough for greater than 2 weeks.  Cough has become productive.  New onset symptoms of headaches and nasal congestion has set in.  She has been using her inhalers and over-the-counter cough syrup without great relief.  Denies fever, chills, nausea, vomit.  She is tolerating p.o.  She has a history of bronchitis.  ROS: See pertinent positives and negatives per HPI.  Patient Active Problem List   Diagnosis Date Noted  . Family history of heart disease 09/03/2019  . Mild intermittent asthma 07/25/2019  . Osteoporosis 06/12/2019  . Strain of thoracic back region 06/06/2019  . Elevated LFTs 06/06/2019  . Hematuria 06/06/2019  . Frequent urination at night 06/06/2019  . Elevated IgE level 09/12/2017  . 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 Dizziness  09/29/2016  . Headache, migraine 02/15/2015  . Vitamin D deficiency 10/19/2014  . Encounter for preventive health examination 10/19/2014  . Depression with anxiety 09/02/2014  . Dyspnea on exertion 08/26/2014  . Hyperlipidemia 08/18/2014  . Hypothyroidism 08/18/2014  . Obesity (BMI 30-39.9) 08/18/2014    Social History   Tobacco Use  . Smoking status: Never Smoker  . Smokeless tobacco: Never Used  Substance Use Topics  . Alcohol use: Yes    Comment: occ     Current Outpatient Medications:  .  albuterol (VENTOLIN HFA) 108 (90 Base) MCG/ACT inhaler, Inhale 2 puffs into the lungs every 6 (six) hours as needed for wheezing or shortness of breath., Disp: 6.7 g, Rfl: 5 .  alendronate (FOSAMAX) 70 MG tablet, Take 1 tablet (70 mg total) by mouth every 7 (seven) days. Take with a full glass of water on an empty stomach., Disp: 4 tablet, Rfl: 11 .  atorvastatin (LIPITOR) 40 MG tablet, Take 1 tablet (40 mg total) by mouth daily., Disp: 90 tablet, Rfl: 3 .  clonazePAM (KLONOPIN) 0.5 MG tablet, 1 po tid prn, Disp: 90 tablet, Rfl: 2 .  levothyroxine (SYNTHROID) 112 MCG tablet, Take 1 tablet (112 mcg total) by mouth daily., Disp: 90 tablet, Rfl: 3 .  meloxicam (MOBIC) 15 MG tablet, Take 1 tablet (15 mg total) by mouth daily., Disp: 30 tablet, Rfl: 3 .  methocarbamol (ROBAXIN) 500 MG tablet, Take 1 tablet (500 mg total) by mouth 4 (four) times daily as needed for muscle spasms., Disp: 60 tablet, Rfl: 1 .  montelukast (SINGULAIR) 10 MG tablet, Take 1 tablet (  10 mg total) by mouth at bedtime., Disp: 30 tablet, Rfl: 11 .  ondansetron (ZOFRAN) 4 MG tablet, Take 1 tablet (4 mg total) by mouth every 8 (eight) hours as needed for up to 10 doses for nausea or vomiting., Disp: 10 tablet, Rfl: 0 .  PARoxetine (PAXIL) 40 MG tablet, Take 1 tablet (40 mg total) by mouth daily., Disp: 90 tablet, Rfl: 0 .  rizatriptan (MAXALT) 5 MG tablet, Take 1 tablet (5 mg total) by mouth as needed for migraine. May repeat in 2  hours if needed, Disp: 10 tablet, Rfl: 5 .  VITAMIN D PO, Take 2,000 Units by mouth daily., Disp: , Rfl:  .  doxycycline (VIBRA-TABS) 100 MG tablet, Take 1 tablet (100 mg total) by mouth 2 (two) times daily., Disp: 20 tablet, Rfl: 0 .  HYDROcodone-Homatropine 5-1.5 MG TABS, Take 1 tablet by mouth 2 (two) times daily as needed., Disp: 40 tablet, Rfl: 0  Allergies  Allergen Reactions  . Abilify [Aripiprazole] Nausea And Vomiting  . Amoxicillin Diarrhea  . Seroquel [Quetiapine Fumarate]     Unknown    OBJECTIVE: LMP 01/31/2007  Gen: No acute distress. Nontoxic in appearance.  HENT: AT. [place].  MMM.  Eyes:Pupils Equal Round Reactive to light, Extraocular movements intact,  Conjunctiva without redness, discharge or icterus. Chest: Cough mild, no shortness of breath Skin: no rashes, purpura or petechiae.  Neuro: . Alert. Oriented x3  Psych: Normal affect and demeanor. Normal speech. Normal thought content and judgment.  ASSESSMENT AND PLAN: Tiffany Horn is a 59 y.o. female present for  Cough/bacterial sinusitis rest.  Hydrate.   Symptoms greater than 2 weeks likely started viral and has set into a bacterial infection. Doxycycline twice daily prescribed Hycodan tabs prescribed.  Patient aware medication has been on national back order.  No refills will be provided As needed      [facility], DO 12/01/2019   No orders of the defined types were placed in this encounter.  Meds ordered this encounter  Medications  . HYDROcodone-Homatropine 5-1.5 MG TABS    Sig: Take 1 tablet by mouth 2 (two) times daily as needed.    Dispense:  40 tablet    Refill:  0  . doxycycline (VIBRA-TABS) 100 MG tablet    Sig: Take 1 tablet (100 mg total) by mouth 2 (two) times daily.    Dispense:  20 tablet    Refill:  0    She has had this medication many times and tolerates- not a true allergy   Referral Orders  No referral(s) requested today

## 2019-12-08 LAB — HM DEXA SCAN

## 2019-12-10 ENCOUNTER — Telehealth: Payer: Self-pay

## 2019-12-10 NOTE — Telephone Encounter (Signed)
Received bone density exam results from [facility]. Placed on PCP desk for review.

## 2019-12-10 NOTE — Telephone Encounter (Signed)
Please call patient: Her bone density has mildly improved from 2 years ago.  Her T score is now -2.6-was still in the osteoporotic range but improved. Continue Fosamax.  We will repeat this scan in 2 years

## 2019-12-11 NOTE — Telephone Encounter (Signed)
Spoke with pt regarding labs and instructions.   

## 2019-12-17 NOTE — Telephone Encounter (Signed)
error 

## 2019-12-29 ENCOUNTER — Ambulatory Visit: Payer: BC Managed Care – PPO | Admitting: Dermatology

## 2020-01-13 ENCOUNTER — Telehealth: Payer: Self-pay | Admitting: Family Medicine

## 2020-01-13 NOTE — Telephone Encounter (Signed)
I am not certain the meaning behind her not wanting to wait until next weeks means? - if it is a ppd test then she can have a nurse visit and read 01-75 hours after application.  If needing a TB blood test she would need to be seen by provider- but I have openings this week?

## 2020-01-13 NOTE — Telephone Encounter (Signed)
Please advise if appropriate.

## 2020-01-13 NOTE — Telephone Encounter (Signed)
Patient needs TB test asap for new job, does not want to wait until next week. Please call patient to advise and schedule if appropriate.

## 2020-01-13 NOTE — Telephone Encounter (Signed)
Pt scheduled for 2:15 on 12/15 for tb placement

## 2020-01-14 ENCOUNTER — Other Ambulatory Visit: Payer: Self-pay

## 2020-01-14 ENCOUNTER — Ambulatory Visit (INDEPENDENT_AMBULATORY_CARE_PROVIDER_SITE_OTHER): Payer: BC Managed Care – PPO

## 2020-01-14 DIAGNOSIS — Z111 Encounter for screening for respiratory tuberculosis: Secondary | ICD-10-CM | POA: Diagnosis not present

## 2020-01-14 NOTE — Progress Notes (Signed)
Patient here for tb skin test per Dr. Raoul Pitch.  Patient needs it for tutoring.  Tb placed in right arm.  Patient will be back Friday to get tb read.

## 2020-01-16 LAB — TB SKIN TEST
Induration: 0 mm
TB Skin Test: NEGATIVE

## 2020-02-13 ENCOUNTER — Telehealth: Payer: Self-pay | Admitting: Family Medicine

## 2020-02-13 NOTE — Telephone Encounter (Signed)
Sorry to hear that she fell and hurt her ribs.  I am unable to see the urgent care notes or x-ray.  Also since I did not see her for this fall, I do not know the severity of her symptoms so I am only able to offer very generic type of advice for her.  600 mg of Advil/ibuprofen every 8 hours, alternated by Tylenol extra strength between Advil doses can help with the pain.  Avoid any type of turning/twisting motion of her torso. Most important thing for her is to remember to take at least 2 deep cleansing breaths, even if it is a little painful every 30 minutes.  It is easy for people with rib injuries to get pneumonia because they are not taking deep enough breaths because it causes pain.  If new cough or fever develop she should be seen immediately to rule out pneumonia.

## 2020-02-13 NOTE — Telephone Encounter (Signed)
Pt failed yesterday in [employer] parking lot, pt has severe pain in rib section. Pt went to ER for xray last night states no fracture and to take otc pain meds. Pt states she would like to know from Dr. Raoul Pitch what she should and should not being doing to cause worse injury to herself states the pain is very severe right now. Please advise.

## 2020-02-13 NOTE — Telephone Encounter (Signed)
Spoke with pt regarding following advice from provider

## 2020-02-13 NOTE — Telephone Encounter (Signed)
Pt was informed by UC to take OTC NSAIDs with Tylenol along with rest. Advise pt to follow those instructions, but states that she is still in severe pain. Please advise

## 2020-02-24 ENCOUNTER — Encounter: Payer: Self-pay | Admitting: General Practice

## 2020-02-25 ENCOUNTER — Ambulatory Visit: Payer: BC Managed Care – PPO | Admitting: Internal Medicine

## 2020-02-26 ENCOUNTER — Emergency Department (HOSPITAL_COMMUNITY): Payer: BC Managed Care – PPO

## 2020-02-26 ENCOUNTER — Telehealth: Payer: Self-pay | Admitting: Family Medicine

## 2020-02-26 ENCOUNTER — Emergency Department (HOSPITAL_COMMUNITY)
Admission: EM | Admit: 2020-02-26 | Discharge: 2020-02-26 | Disposition: A | Discharge status: Home / Self Care | Payer: BC Managed Care – PPO | Attending: Emergency Medicine | Admitting: Emergency Medicine

## 2020-02-26 DIAGNOSIS — I10 Essential (primary) hypertension: Secondary | ICD-10-CM | POA: Insufficient documentation

## 2020-02-26 DIAGNOSIS — R1013 Epigastric pain: Secondary | ICD-10-CM | POA: Insufficient documentation

## 2020-02-26 DIAGNOSIS — J45909 Unspecified asthma, uncomplicated: Secondary | ICD-10-CM | POA: Diagnosis not present

## 2020-02-26 DIAGNOSIS — Z8616 Personal history of COVID-19: Secondary | ICD-10-CM | POA: Insufficient documentation

## 2020-02-26 DIAGNOSIS — R079 Chest pain, unspecified: Secondary | ICD-10-CM | POA: Diagnosis present

## 2020-02-26 LAB — CBC WITH DIFFERENTIAL/PLATELET
Abs Immature Granulocytes: 0.02 10*3/uL (ref 0.00–0.07)
Basophils Absolute: 0.1 10*3/uL (ref 0.0–0.1)
Basophils Relative: 1 %
Eosinophils Absolute: 0.2 10*3/uL (ref 0.0–0.5)
Eosinophils Relative: 3 %
HCT: 40.7 % (ref 36.0–46.0)
Hemoglobin: 13.2 g/dL (ref 12.0–15.0)
Immature Granulocytes: 0 %
Lymphocytes Relative: 25 %
Lymphs Abs: 1.4 10*3/uL (ref 0.7–4.0)
MCH: 29.3 pg (ref 26.0–34.0)
MCHC: 32.4 g/dL (ref 30.0–36.0)
MCV: 90.2 fL (ref 80.0–100.0)
Monocytes Absolute: 0.5 10*3/uL (ref 0.1–1.0)
Monocytes Relative: 9 %
Neutro Abs: 3.5 10*3/uL (ref 1.7–7.7)
Neutrophils Relative %: 62 %
Platelets: 225 10*3/uL (ref 150–400)
RBC: 4.51 MIL/uL (ref 3.87–5.11)
RDW: 12.1 % (ref 11.5–15.5)
WBC: 5.7 10*3/uL (ref 4.0–10.5)
nRBC: 0 % (ref 0.0–0.2)

## 2020-02-26 LAB — TROPONIN I (HIGH SENSITIVITY)
Troponin I (High Sensitivity): <2 ng/L (ref <18)
Troponin I (High Sensitivity): <2 ng/L (ref <18)

## 2020-02-26 LAB — COMPREHENSIVE METABOLIC PANEL
ALT: 18 U/L (ref 0–44)
AST: 20 U/L (ref 15–41)
Albumin: 3.7 g/dL (ref 3.5–5.0)
Alkaline Phosphatase: 63 U/L (ref 38–126)
Anion gap: 8 (ref 5–15)
BUN: 12 mg/dL (ref 6–20)
CO2: 25 mmol/L (ref 22–32)
Calcium: 9 mg/dL (ref 8.9–10.3)
Chloride: 106 mmol/L (ref 98–111)
Creatinine, Ser: 0.77 mg/dL (ref 0.44–1.00)
GFR, Estimated: >60 mL/min (ref >60)
Glucose, Bld: 106 mg/dL — ABNORMAL HIGH (ref 70–99)
Potassium: 3.7 mmol/L (ref 3.5–5.1)
Sodium: 139 mmol/L (ref 135–145)
Total Bilirubin: 0.7 mg/dL (ref 0.3–1.2)
Total Protein: 6.3 g/dL — ABNORMAL LOW (ref 6.5–8.1)

## 2020-02-26 LAB — LIPASE, BLOOD: Lipase: 31 U/L (ref 11–51)

## 2020-02-26 NOTE — Telephone Encounter (Signed)
Patient discharged from [facility] today. States she needs immediate referral for heart testing - stress test, angiogram, echo. No HFU appointments available until next week and patient was instructed not to wait that long.  Please advise if we can see her tomorrow or just put in referral to Cardiology.

## 2020-02-26 NOTE — Telephone Encounter (Signed)
Per review of the EMR,  patient was seen in the emergency room today for chest pain and discharged.  She had normal troponins and EKG was unchanged.  The note does not reflect she needed to see cardiology immediately.  Chest pain patient's are kept in the hospital and observed.  Cardiology is called in to evaluate them if felt cardiac in nature.  Monday at 1130 is appropriate for follow-up.  Of course if her chest pain worsens she should return to the emergency room so that she can have immediate testing.

## 2020-02-26 NOTE — ED Triage Notes (Signed)
[facility] pt awakened at 0330 with chest pain. Also c/o nausea and back pain. 8/10 and "sharp" in nature. Medic administered Nitro SL X1, ASA 324mg  and Zofran 4mg .

## 2020-02-26 NOTE — ED Provider Notes (Signed)
[place] EMERGENCY DEPARTMENT Provider Note  CSN: [id number] Arrival date & time: 02/26/20 0825    History Chief Complaint  Patient presents with  . Chest Pain    HPI  Tiffany Horn is a 60 y.o. female with history of HTN, HLD and strong family history of CAD reports she woke up from sleep around 0330 with severe sharp epigastric and lower sternal chest pain, radiating into L arm, associated with SOB and diaphoresis. She reports the pain waxed and waned through the night and she was not able to get any sleep. She reports while getting dressed for work, she noted increased pain with putting on her shirt/raising her arms, but also worse with walking into work. She was diaphoretic and SOB then as well. Coworkers called EMS, she was given ASA and NTG without much improvement. She was also given Zofran with improved nausea.   Past Medical History:  Diagnosis Date  . Anxiety   . Arthritis    knees and spine, shoulder  . Asthma   . Bronchitis    hx - recurrent  . Complication of anesthesia    waking up is not easy  . COVID-19   . Depression   . Elevated IgE level 09/12/2017   09/12/2017 IgE 195  . H/O miscarriage, not currently pregnant   . Hx of irritable bowel syndrome    x2  . Hyperlipidemia    diet controlled - no medication  . Hypertension    not taking any meds at present - under control per patient  . Hypokalemia    with PNA admission (2.5)  . Migraines   . Pneumonia    4 episodes; hosp. admission 2014  . PONV (postoperative nausea and vomiting)   . Thyroid disease     Past Surgical History:  Procedure Laterality Date  . BREAST SURGERY     implants, then had them removed  . COLONOSCOPY     greater 10 yrs ago - ? [place]    . LAPAROSCOPIC ABDOMINAL EXPLORATION  1994   endometriosis  . ORIF HUMERUS FRACTURE Left 04/01/2013   [facility] - shoulder  . ORIF HUMERUS FRACTURE Left 04/01/2013   Procedure: OPEN  REDUCTION INTERNAL FIXATION (ORIF) LEFT PROXIMAL HUMERUS FRACTURE;  Surgeon: Mcarthur Rossetti, MD;  Location: [facility];  Service: Orthopedics;  Laterality: Left;  . TONSILLECTOMY AND ADENOIDECTOMY      Family History  Problem Relation Age of Onset  . Breast cancer Mother   . Diabetes Mother   . Heart disease Mother   . Diabetes Father   . Dementia Father   . Colon cancer Father   . Stroke Paternal Grandmother   . Diabetes Paternal Grandmother     Social History   Tobacco Use  . Smoking status: Never Smoker  . Smokeless tobacco: Never Used  Vaping Use  . Vaping Use: Never used  Substance Use Topics  . Alcohol use: Yes    Comment: occ   . Drug use: No     Home Medications Prior to Admission medications   Medication Sig Start Date End Date Taking? Authorizing Provider  albuterol (VENTOLIN HFA) 108 (90 Base) MCG/ACT inhaler Inhale 2 puffs into the lungs every 6 (six) hours as needed for wheezing or shortness of breath. 05/21/19   Kuneff, Renee A, DO  alendronate (FOSAMAX) 70 MG tablet Take 1 tablet (70 mg total) by mouth every 7 (seven) days. Take with a full glass  of water on an empty stomach. 06/18/19   Kuneff, Renee A, DO  atorvastatin (LIPITOR) 40 MG tablet Take 1 tablet (40 mg total) by mouth daily. 09/04/19   Raoul Pitch, Renee A, DO  clonazePAM (KLONOPIN) 0.5 MG tablet 1 po tid prn 07/04/19   Donnal Moat T, PA-C  doxycycline (VIBRA-TABS) 100 MG tablet Take 1 tablet (100 mg total) by mouth 2 (two) times daily. 11/28/19   Kuneff, Renee A, DO  HYDROcodone-Homatropine 5-1.5 MG TABS Take 1 tablet by mouth 2 (two) times daily as needed. 11/28/19   Kuneff, Renee A, DO  levothyroxine (SYNTHROID) 112 MCG tablet Take 1 tablet (112 mcg total) by mouth daily. 05/22/19   Kuneff, Renee A, DO  meloxicam (MOBIC) 15 MG tablet Take 1 tablet (15 mg total) by mouth daily. 08/06/19   Mcarthur Rossetti, MD  methocarbamol (ROBAXIN) 500 MG tablet Take 1 tablet (500 mg total) by mouth 4 (four) times  daily as needed for muscle spasms. 08/06/19   Mcarthur Rossetti, MD  montelukast (SINGULAIR) 10 MG tablet Take 1 tablet (10 mg total) by mouth at bedtime. 07/31/19   Martyn Ehrich, NP  ondansetron (ZOFRAN) 4 MG tablet Take 1 tablet (4 mg total) by mouth every 8 (eight) hours as needed for up to 10 doses for nausea or vomiting. 10/19/19   Curatolo, Adam, DO  PARoxetine (PAXIL) 40 MG tablet Take 1 tablet (40 mg total) by mouth daily. 08/15/19   Donnal Moat T, PA-C  rizatriptan (MAXALT) 5 MG tablet Take 1 tablet (5 mg total) by mouth as needed for migraine. May repeat in 2 hours if needed 09/03/19   Kuneff, Renee A, DO  VITAMIN D PO Take 2,000 Units by mouth daily.    [provider]     Allergies    Abilify [aripiprazole], Amoxicillin, and Seroquel [quetiapine fumarate]   Review of Systems   Review of Systems A comprehensive review of systems was completed and negative except as noted in HPI.    Physical Exam BP 123/83 (BP Location: Right Arm)   Pulse 69   Temp 98.7 F (37.1 C)   Resp 12   Ht 5\' 6"  (1.676 m)   Wt 78.9 kg   LMP 01/31/2007   SpO2 98%   BMI 28.08 kg/m   Physical Exam Vitals and nursing note reviewed.  Constitutional:      Appearance: Normal appearance.  HENT:     Head: Normocephalic and atraumatic.     Nose: Nose normal.     Mouth/Throat:     Mouth: Mucous membranes are moist.  Eyes:     Extraocular Movements: Extraocular movements intact.     Conjunctiva/sclera: Conjunctivae normal.  Cardiovascular:     Rate and Rhythm: Normal rate.  Pulmonary:     Effort: Pulmonary effort is normal.     Breath sounds: Normal breath sounds.  Chest:     Chest wall: Tenderness (lower sternum) present.  Abdominal:     General: Abdomen is flat.     Palpations: Abdomen is soft.     Tenderness: There is abdominal tenderness (epigastic). There is no guarding. Negative signs include Murphy's sign and McBurney's sign.  Musculoskeletal:        General: No  swelling. Normal range of motion.     Cervical back: Neck supple.  Skin:    General: Skin is warm and dry.  Neurological:     General: No focal deficit present.     Mental Status: She is alert.  Psychiatric:        Mood and Affect: Mood normal.      ED Results / Procedures / Treatments   Labs (all labs ordered are listed, but only abnormal results are displayed) Labs Reviewed  COMPREHENSIVE METABOLIC PANEL - Abnormal; Notable for the following components:      Result Value   Glucose, Bld 106 (*)    Total Protein 6.3 (*)    All other components within normal limits  LIPASE, BLOOD  CBC WITH DIFFERENTIAL/PLATELET  TROPONIN I (HIGH SENSITIVITY)  TROPONIN I (HIGH SENSITIVITY)    EKG EKG Interpretation  Date/Time:  Thursday February 26 2020 08:38:38 EST Ventricular Rate:  80 PR Interval:    QRS Duration: 91 QT Interval:  390 QTC Calculation: 450 R Axis:   38 Text Interpretation: Sinus rhythm No significant change since last tracing Confirmed by Calvert Cantor [phone]) on 02/26/2020 8:44:25 AM   Radiology DG Chest 2 View  Result Date: 02/26/2020 CLINICAL DATA:  Chest pain. EXAM: CHEST - 2 VIEW COMPARISON:  07/25/2019.  Left humerus 06/11/2015. FINDINGS: Mediastinum and hilar structures normal. Heart size normal. Low lung volumes with mild bibasilar atelectasis. No pleural effusion or pneumothorax. Stable appearance of plate and screw fixation left humerus. IMPRESSION: Low lung volumes with mild bibasilar atelectasis. Electronically Signed   By: Marcello Moores  Register   On: 02/26/2020 09:25   US Abdomen Limited  Result Date: 02/26/2020 CLINICAL DATA:  Right upper quadrant epigastric pain since 3:30 a.m. EXAM: ULTRASOUND ABDOMEN LIMITED RIGHT UPPER QUADRANT COMPARISON:  CT abdomen pelvis 10/19/2019 FINDINGS: Gallbladder: No gallstones or wall thickening visualized. No sonographic Murphy sign noted by sonographer. Common bile duct: Diameter: 5 mm Liver: No focal lesion identified.  Within normal limits in parenchymal echogenicity. Portal vein is patent on color Doppler imaging with normal direction of blood flow towards the liver. Other: None. IMPRESSION: Normal right upper quadrant ultrasound Electronically Signed   By: Miachel Roux M.D.   On: 02/26/2020 11:34    Procedures Procedures  Medications Ordered in the ED Medications - No data to display   MDM Rules/Calculators/A&P MDM Patient with numerous CAD risk factors but no history of cardiac disease here with lower chest, upper abdomen pain since last night. Some concerning features (worse with walking, SOB, diaphoresis) but some atypical features (epigastric, tender to palpation, worse raising arms). Will check labs including CBC, CMP, lipase and troponin to evaluate cardiac but also hepatobiliary etiologies. She is resting comfortably now.   ED Course  I have reviewed the triage vital signs and the nursing notes.  Pertinent labs & imaging results that were available during my care of the patient were reviewed by me and considered in my medical decision making (see chart for details).  Clinical Course as of 02/26/20 1514  Thu Feb 26, 2020  0943 CBC is normal. CXR is clear [CS]  1011 CMP, Lipase and Trop are normal. Continue to monitor in the ED.  [CS]  1141 [place] is negative for cholecystitis or cholelithiasis.  [CS]  [id number] Trop #2 remains normal. No signs of MI. Discussed inpatient vs outpatient evaluation of the patient's symptoms. She is moderate risk by HEART Pathway score of 4. She would prefer to go home and follow up with her PCP for outpatient stress testing. She was encouraged to return to the ED for reassessment if her symptoms worsen or if she has any other concerns.  [CS]    Clinical Course User Index [CS] Truddie Hidden, MD  Final Clinical Impression(s) / ED Diagnoses Final diagnoses:  Nonspecific chest pain    Rx / DC Orders ED Discharge Orders    None       Truddie Hidden,  MD 02/26/20 [phone]

## 2020-02-26 NOTE — ED Notes (Signed)
US at bedside

## 2020-02-26 NOTE — Telephone Encounter (Signed)
Next Hos f/u slot is Monday @1130 . Please advise if it is okay to schedule at that time

## 2020-02-26 NOTE — Telephone Encounter (Signed)
Spoke with pt and informed her of the providers recommendations and instructions. Offered pt the 1130 appt for Monday. Pt became upset and wanted Dr. Raoul Pitch to jut go ahead and place the referral. Pt states she knows what her body needs and she just needs Dr. Raoul Pitch to place the referral. Pt repeatedly stated how the hospital doctors states that she should be seen immediately and just need Dr. Raoul Pitch to place the referral and see her sooner. Pt declined Monday appt and and stated she will find a new PCP.

## 2020-02-26 NOTE — ED Notes (Signed)
Patient transported to Xray via stretcher in stable condition. 

## 2020-02-27 NOTE — Telephone Encounter (Signed)
I can understand her frustration and her desire to get what she believes she needs- and urgently. However, Drs, need patients  to follow up and be evaluated in order to place orders on them or provide medical care.  Since pt is upset and searching for a new PCP, we will remove her from our panel and cover her urgent needs for 30 days- d/t compromise of our working relationship with patient.   Thanks.

## 2020-03-02 ENCOUNTER — Telehealth: Payer: Self-pay

## 2020-03-02 ENCOUNTER — Other Ambulatory Visit: Payer: Self-pay

## 2020-03-02 ENCOUNTER — Encounter: Payer: Self-pay | Admitting: Cardiology

## 2020-03-02 ENCOUNTER — Ambulatory Visit: Payer: BC Managed Care – PPO | Admitting: Cardiology

## 2020-03-02 ENCOUNTER — Other Ambulatory Visit: Payer: Self-pay | Admitting: Physician Assistant

## 2020-03-02 ENCOUNTER — Telehealth: Payer: Self-pay | Admitting: Physician Assistant

## 2020-03-02 VITALS — BP 110/70 | HR 75 | Ht 66.0 in | Wt 175.0 lb

## 2020-03-02 DIAGNOSIS — F419 Anxiety disorder, unspecified: Secondary | ICD-10-CM

## 2020-03-02 DIAGNOSIS — R0600 Dyspnea, unspecified: Secondary | ICD-10-CM

## 2020-03-02 DIAGNOSIS — E7800 Pure hypercholesterolemia, unspecified: Secondary | ICD-10-CM

## 2020-03-02 DIAGNOSIS — R072 Precordial pain: Secondary | ICD-10-CM | POA: Diagnosis not present

## 2020-03-02 DIAGNOSIS — R079 Chest pain, unspecified: Secondary | ICD-10-CM | POA: Diagnosis not present

## 2020-03-02 DIAGNOSIS — R0609 Other forms of dyspnea: Secondary | ICD-10-CM

## 2020-03-02 MED ORDER — ATORVASTATIN CALCIUM 40 MG PO TABS: 40.0000 mg | ORAL_TABLET | Freq: Every day | ORAL | 3 refills | Status: DC

## 2020-03-02 MED ORDER — METOPROLOL TARTRATE 100 MG PO TABS: 100.0000 mg | ORAL_TABLET | Freq: Once | ORAL | 0 refills | Status: DC

## 2020-03-02 MED ORDER — CLONAZEPAM 0.5 MG PO TABS: 0.5000 mg | ORAL_TABLET | Freq: Three times a day (TID) | ORAL | 0 refills | Status: DC | PRN

## 2020-03-02 NOTE — Telephone Encounter (Signed)
Patient needs Dr. Raoul Pitch to complete and sign from for her employer so that she can return to work.  She had TB test in December. After reviewing form, patient will need appt with Dr. Raoul Pitch, I will speak to Adolm Joseph and get direction from her and Dr. Raoul Pitch on how to proceed further  Patient also stated that she had a medication reaction from a combination of meds that they gave her at the ED.  She apologized for any rude behavior she may have expressed the other day and follow up to her ED visit.   She wants to speak to Adolm Joseph to apologize to her.

## 2020-03-02 NOTE — Patient Instructions (Signed)
Medication Instructions: The current medical regimen is effective;  continue present plan and medications.  *If you need a refill on your cardiac medications before your next appointment, please call your pharmacy*  Testing/Procedures: Your physician has requested that you have an echocardiogram. Echocardiography is a painless test that uses sound waves to create images of your heart. It provides your doctor with information about the size and shape of your heart and how well your heart's chambers and valves are working. This procedure takes approximately one hour. There are no restrictions for this procedure.  Your cardiac CT will be scheduled at:   [facility] [address] [place], [place] [address] [phone]  Please arrive at the [facility] main entrance of [facility] 30 minutes prior to test start time. Proceed to the [facility] Radiology Department (first floor) to check-in and test prep.  Please follow these instructions carefully (unless otherwise directed):  On the Night Before the Test: . Be sure to Drink plenty of water. . Do not consume any caffeinated/decaffeinated beverages or chocolate 12 hours prior to your test. . Do not take any antihistamines 12 hours prior to your test  On the Day of the Test: . Drink plenty of water. Do not drink any water within one hour of the test. . Do not eat any food 4 hours prior to the test. . You may take your regular medications prior to the test.  . Take metoprolol (Lopressor) two hours prior to test. . HOLD Furosemide/Hydrochlorothiazide morning of the test. . FEMALES- please wear underwire-free bra if available .    After the Test: . Drink plenty of water. . After receiving IV contrast, you may experience a mild flushed feeling. This is normal. . On occasion, you may experience a mild rash up to 24 hours after the test. This is not dangerous. If this occurs, you can take Benadryl 25 mg and increase your  fluid intake. . If you experience trouble breathing, this can be serious. If it is severe call 911 IMMEDIATELY. If it is mild, please call our office. . If you take any of these medications: Glipizide/Metformin, Avandament, Glucavance, please do not take 48 hours after completing test unless otherwise instructed.   Once we have confirmed authorization from your insurance company, we will call you to set up a date and time for your test. Based on how quickly your insurance processes prior authorizations requests, please allow up to 4 weeks to be contacted for scheduling your Cardiac CT appointment. Be advised that routine Cardiac CT appointments could be scheduled as many as 8 weeks after your provider has ordered it.  For non-scheduling related questions, please contact the cardiac imaging nurse navigator should you have any questions/concerns: Marchia Bond, Cardiac Imaging Nurse Navigator Burley Saver, Interim Cardiac Imaging Nurse [place] [facility] Direct Office Dial: [phone]   For scheduling needs, including cancellations and rescheduling, please call [place], [phone].  Follow-Up: At [facility], you and your health needs are our priority.  As part of our continuing mission to provide you with exceptional heart care, we have created designated Provider Care Teams.  These Care Teams include your primary Cardiologist (physician) and Advanced Practice Providers (APPs -  Physician Assistants and Nurse Practitioners) who all work together to provide you with the care you need, when you need it.  We recommend signing up for the patient portal called "MyChart".  Sign up information is provided on this After Visit Summary.  MyChart is used to connect with patients for Virtual Visits (Telemedicine).  Patients are able to view lab/test results, encounter notes, upcoming appointments, etc.  Non-urgent messages can be sent to your provider as well.   To learn more  about what you can do with MyChart, go to [email or web address].    Your next appointment:   12 month(s)  The format for your next appointment:   In Person  Provider:   Candee Furbish, MD   Thank you for choosing [facility]!!

## 2020-03-02 NOTE — Telephone Encounter (Signed)
Next visit is 04/23/20. Requesting refill on Klonopin called to [facility] in [facility], Phone # is [phone].

## 2020-03-02 NOTE — Progress Notes (Signed)
Cardiology Office Note:    Date:  03/02/2020   ID:  Tiffany Horn, DOB [birth year], MRN [id number]  PCP:  [name], DO  [facility] Cardiologist:  Candee Furbish, MD  [facility] Electrophysiologist:  None   Referring MD: [name], DO     History of Present Illness:    Tiffany Horn is a 60 y.o. female here for the evaluation of chest pain at the request of Dr. Raoul Pitch.  Recent ER visit on 02/26/2020 reviewed.  Has a strong family history of CAD, hyperlipidemia, hypertension, woke up from sleep at about 3:30 in the morning with severe sharp epigastric and lower sternal chest pain radiating to her left arm. Tingling in left fingers. Tight chest severe. Tried water. 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  She felt short of breath and sweaty with this.  It waxed and waned through the night she was unable to get any more sleep.  She was getting dressed for work and started to notice the increased pain when she was putting on her shirt and raising her arms but she also noted when she was walking.  She felt more short of breath and diaphoretic.  Her coworkers ended up calling EMS and she was given both nitroglycerin as well as aspirin without any significant improvement.  Zofran helped with the nausea.  Family had stroke and MI in their 99's. Mom died 31, Dad 4.   So tired and SOB.   Lab work was performed, troponin was normal less than 2.  Creatinine 0.7 hemoglobin 13.2.  Ultrasound of abdomen was performed and was normal.  EKG personally reviewed shows sinus rhythm 80 with no other abnormalities.  Past Medical History:  Diagnosis Date  . Anxiety   . Arthritis    knees and spine, shoulder  . Asthma   . Bronchitis    hx - recurrent  . Complication of anesthesia    waking up is not easy  . COVID-19   . Depression   . Elevated IgE level 09/12/2017   09/12/2017 IgE 195  . H/O miscarriage, not currently pregnant   . Hx of irritable bowel syndrome    x2  . Hyperlipidemia     diet controlled - no medication  . Hypertension    not taking any meds at present - under control per patient  . Hypokalemia    with PNA admission (2.5)  . Migraines   . Pneumonia    4 episodes; hosp. admission 2014  . PONV (postoperative nausea and vomiting)   . Thyroid disease     Past Surgical History:  Procedure Laterality Date  . BREAST SURGERY     implants, then had them removed  . COLONOSCOPY     greater 10 yrs ago - ? [place]    . LAPAROSCOPIC ABDOMINAL EXPLORATION  1994   endometriosis  . ORIF HUMERUS FRACTURE Left 04/01/2013   [facility] - shoulder  . ORIF HUMERUS FRACTURE Left 04/01/2013   Procedure: OPEN REDUCTION INTERNAL FIXATION (ORIF) LEFT PROXIMAL HUMERUS FRACTURE;  Surgeon: Mcarthur Rossetti, MD;  Location: [place];  Service: Orthopedics;  Laterality: Left;  . TONSILLECTOMY AND ADENOIDECTOMY      Current Medications: Current Meds  Medication Sig  . albuterol (VENTOLIN HFA) 108 (90 Base) MCG/ACT inhaler Inhale 2 puffs into the lungs every 6 (six) hours as needed for wheezing or shortness of breath.  Marland Kitchen alendronate (FOSAMAX) 70 MG tablet Take 1 tablet (  70 mg total) by mouth every 7 (seven) days. Take with a full glass of water on an empty stomach.  . clonazePAM (KLONOPIN) 0.5 MG tablet 1 po tid prn  . doxycycline (VIBRA-TABS) 100 MG tablet Take 1 tablet (100 mg total) by mouth 2 (two) times daily.  Marland Kitchen HYDROcodone-Homatropine 5-1.5 MG TABS Take 1 tablet by mouth 2 (two) times daily as needed.  Marland Kitchen levothyroxine (SYNTHROID) 112 MCG tablet Take 1 tablet (112 mcg total) by mouth daily.  . meloxicam (MOBIC) 15 MG tablet Take 1 tablet (15 mg total) by mouth daily.  . methocarbamol (ROBAXIN) 500 MG tablet Take 1 tablet (500 mg total) by mouth 4 (four) times daily as needed for muscle spasms.  . metoprolol tartrate (LOPRESSOR) 100 MG tablet Take 1 tablet (100 mg total) by mouth once for 1 dose. Take 1 tablet 2 hrs before your  Coronary CT  . montelukast (SINGULAIR) 10 MG tablet Take 1 tablet (10 mg total) by mouth at bedtime.  . ondansetron (ZOFRAN) 4 MG tablet Take 1 tablet (4 mg total) by mouth every 8 (eight) hours as needed for up to 10 doses for nausea or vomiting.  Marland Kitchen PARoxetine (PAXIL) 40 MG tablet Take 1 tablet (40 mg total) by mouth daily.  . rizatriptan (MAXALT) 5 MG tablet Take 1 tablet (5 mg total) by mouth as needed for migraine. May repeat in 2 hours if needed  . VITAMIN D PO Take 2,000 Units by mouth daily.  . [DISCONTINUED] atorvastatin (LIPITOR) 40 MG tablet Take 1 tablet (40 mg total) by mouth daily.     Allergies:   Abilify [aripiprazole], Amoxicillin, and Seroquel [quetiapine fumarate]   Social History   Socioeconomic History  . Marital status: Divorced    Spouse name: Not on file  . Number of children: 0  . Years of education: Not on file  . Highest education level: Not on file  Occupational History  . Occupation: Control and instrumentation engineer  Tobacco Use  . Smoking status: Never Smoker  . Smokeless tobacco: Never Used  Vaping Use  . Vaping Use: Never used  Substance and Sexual Activity  . Alcohol use: Yes    Comment: occ   . Drug use: No  . Sexual activity: Not Currently    Partners: Male    Birth control/protection: Post-menopausal  Other Topics Concern  . Not on file  Social History Narrative   She is originally from [place]. She has traveled to [place], [place], [place], [place], [place], [place], [place], [place], [place]. No international travel. She has dogs. No prior bird, mold, or recent hot tub exposure. She hasn't used her hot tub in 1.5 years. 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 Previously enjoyed gardening and playing tennis. Helps to care for her mother.   Social Determinants of Health   Financial Resource Strain: Not on file  Food Insecurity: Not on file  Transportation Needs: Not on file  Physical Activity: Not on file  Stress: Not on file   Social Connections: Not on file     Family History: The patient's family history includes Breast cancer in her mother; Colon cancer in her father; Dementia in her father; Diabetes in her father, mother, and paternal grandmother; Heart disease in her mother; Stroke in her paternal grandmother.  ROS:   Please see the history of present illness.    No fevers chills nausea vomiting syncope bleeding all other systems reviewed and are negative.  EKGs/Labs/Other Studies Reviewed:    The following studies were reviewed today: Reassuring.   Recent Labs: 05/21/2019: TSH 2.11 02/26/2020: ALT 18; BUN 12; Creatinine, Ser 0.77; Hemoglobin 13.2; Platelets 225; Potassium 3.7; Sodium 139  Recent Lipid Panel    Component Value Date/Time   CHOL 214 (H) 09/03/2019 1044   TRIG 152.0 (H) 09/03/2019 1044   HDL 51.90 09/03/2019 1044   CHOLHDL 4 09/03/2019 1044   VLDL 30.4 09/03/2019 1044   LDLCALC 131 (H) 09/03/2019 1044   LDLCALC 172 (H) 05/21/2019 1442     Risk Assessment/Calculations:      Physical Exam:    VS:  BP 110/70 (BP Location: Left Arm, Patient Position: Sitting, Cuff Size: Normal)   Pulse 75   Ht 5' 6"  (1.676 m)   Wt 175 lb (79.4 kg)   LMP 01/31/2007   SpO2 98%   BMI 28.25 kg/m     Wt Readings from Last 3 Encounters:  03/02/20 175 lb (79.4 kg)  02/26/20 174 lb (78.9 kg)  10/19/19 180 lb (81.6 kg)     GEN:  Well nourished, well developed in no acute distress HEENT: Normal NECK: No JVD; No carotid bruits LYMPHATICS: No lymphadenopathy CARDIAC: RRR, no murmurs, rubs, gallops RESPIRATORY:  Clear to auscultation without rales, wheezing or rhonchi  ABDOMEN: Soft, non-tender, non-distended MUSCULOSKELETAL:  No edema; No deformity  SKIN: Warm and dry NEUROLOGIC:  Alert and oriented x 3 PSYCHIATRIC:  Normal affect   ASSESSMENT:    1. Chest pain of uncertain etiology   2. Pure hypercholesterolemia   3. Anxiety   4. Precordial pain   5. Dyspnea on exertion    PLAN:     In order of problems listed above:  Chest pain -We will check a coronary CT scan given her family history of stroke/MI, hyperlipidemia. -Gallbladder ultrasound was normal.  Reassuring.  Certainly other etiologies for chest pain include musculoskeletal, GERD.  Hyperlipidemia -We will go ahead and refill her atorvastatin 40 mg for her.  Aortic atherosclerosis -Mild aortic plaque noted in the distal aorta 10/19/2019 CT abdomen pelvis.  Agree with atorvastatin.  Anxiety -Takes an occasional Klonopin.  Dr. Raoul Pitch.  Paxil.   1 year follow-up prevention  Medication Adjustments/Labs and Tests Ordered: Current medicines are reviewed at length with the patient today.  Concerns regarding medicines are outlined above.  Orders Placed This Encounter  Procedures  . CT CORONARY MORPH W/CTA COR W/SCORE W/CA W/CM &/OR WO/CM  . CT CORONARY FRACTIONAL FLOW RESERVE DATA PREP  . CT CORONARY FRACTIONAL FLOW RESERVE FLUID ANALYSIS  . ECHOCARDIOGRAM COMPLETE   Meds ordered this encounter  Medications  . atorvastatin (LIPITOR) 40 MG tablet    Sig: Take 1 tablet (40 mg total) by mouth daily.    Dispense:  90 tablet    Refill:  3    Dc prior script dose change. Thanks.  . metoprolol tartrate (LOPRESSOR) 100 MG tablet    Sig: Take 1 tablet (100 mg total) by mouth once for 1 dose. Take 1 tablet 2 hrs before your Coronary CT    Dispense:  1 tablet    Refill:  0    Patient Instructions  Medication Instructions: The current medical regimen is effective;  continue present plan and medications.  *If you need a refill on your cardiac medications before your next appointment, please call your pharmacy*  Testing/Procedures: Your physician has requested that you have an echocardiogram. Echocardiography is a painless test that uses sound waves to create images of  your heart. It provides your doctor with information about the size and shape of your heart and how well your heart's chambers and valves are  working. This procedure takes approximately one hour. There are no restrictions for this procedure.  Your cardiac CT will be scheduled at:   [facility] [address] [place], [place] [address] [phone]  Please arrive at the [facility] main entrance of [facility] 30 minutes prior to test start time. Proceed to the [facility] Radiology Department (first floor) to check-in and test prep.  Please follow these instructions carefully (unless otherwise directed):  On the Night Before the Test: . Be sure to Drink plenty of water. . Do not consume any caffeinated/decaffeinated beverages or chocolate 12 hours prior to your test. . Do not take any antihistamines 12 hours prior to your test  On the Day of the Test: . Drink plenty of water. Do not drink any water within one hour of the test. . Do not eat any food 4 hours prior to the test. . You may take your regular medications prior to the test.  . Take metoprolol (Lopressor) two hours prior to test. . HOLD Furosemide/Hydrochlorothiazide morning of the test. . FEMALES- please wear underwire-free bra if available .    After the Test: . Drink plenty of water. . After receiving IV contrast, you may experience a mild flushed feeling. This is normal. . On occasion, you may experience a mild rash up to 24 hours after the test. This is not dangerous. If this occurs, you can take Benadryl 25 mg and increase your fluid intake. . If you experience trouble breathing, this can be serious. If it is severe call 911 IMMEDIATELY. If it is mild, please call our office. . If you take any of these medications: Glipizide/Metformin, Avandament, Glucavance, please do not take 48 hours after completing test unless otherwise instructed.   Once we have confirmed authorization from your insurance company, we will call you to set up a date and time for your test. Based on how quickly your insurance processes prior authorizations requests,  please allow up to 4 weeks to be contacted for scheduling your Cardiac CT appointment. Be advised that routine Cardiac CT appointments could be scheduled as many as 8 weeks after your provider has ordered it.  For non-scheduling related questions, please contact the cardiac imaging nurse navigator should you have any questions/concerns: Marchia Bond, Cardiac Imaging Nurse Navigator Burley Saver, Interim Cardiac Imaging Nurse [place] [facility] Direct Office Dial: [phone]   For scheduling needs, including cancellations and rescheduling, please call [place], [phone].  Follow-Up: At [facility], you and your health needs are our priority.  As part of our continuing mission to provide you with exceptional heart care, we have created designated Provider Care Teams.  These Care Teams include your primary Cardiologist (physician) and Advanced Practice Providers (APPs -  Physician Assistants and Nurse Practitioners) who all work together to provide you with the care you need, when you need it.  We recommend signing up for the patient portal called "MyChart".  Sign up information is provided on this After Visit Summary.  MyChart is used to connect with patients for Virtual Visits (Telemedicine).  Patients are able to view lab/test results, encounter notes, upcoming appointments, etc.  Non-urgent messages can be sent to your provider as well.   To learn more about what you can do with MyChart, go to [email or web address].    Your  next appointment:   12 month(s)  The format for your next appointment:   In Person  Provider:   Candee Furbish, MD   Thank you for choosing [facility]!!         Signed, Candee Furbish, MD  03/02/2020 10:33 AM    [place]

## 2020-03-02 NOTE — Telephone Encounter (Signed)
Prescription was sent.  There will be no more refills until she is seen.

## 2020-03-03 ENCOUNTER — Encounter: Payer: Self-pay | Admitting: Family Medicine

## 2020-03-03 NOTE — Telephone Encounter (Signed)
Left detailed message on patient's voicemail explaining that we are not able to complete her physical exam form since she has not had a physical in the last 6 months/year.  At this time based on phone encounters with staff from January 27 that were reviewed, patient has been dismissed from the practice. Patient was advised that we would only be able to provide urgent medical care to her for the next 30 days and that she will need to seek out a new primary care provider.

## 2020-03-05 ENCOUNTER — Telehealth: Payer: Self-pay | Admitting: Family Medicine

## 2020-03-05 NOTE — Telephone Encounter (Signed)
Please communicate to the patient,  I sincerely appreciate her apology. I also valued our patient-physician relationship and enjoyed providing her care.  I can certainly sympathize with what she must have been going through at that time.  As I am sure she can understand the need for [place] to have a low tolerance policy on patients verbally abusing or arguing with staff, or myself. Since she is sincere in her apology by reaching out to [place], I am willing to remove her dismissal, with an understanding we will not tolerate any repeat of such behavior.   Thanks.

## 2020-03-05 NOTE — Telephone Encounter (Signed)
I received a message that this patient wanted to discuss her dismissal. I called the patient to discuss this on 03/04/2020. She wanted to apologize for her behavior last week and also expressed concerns over being dismissed from all of  practices. She had an understanding that she would be dismissed from [employer] in addition to primary care. I explained to her that it is the policy that when patients are dismissed from one [place] primary care that patients would also need to be dismissed from all of the [place] primary care clinics due to on call coverage. I also explained that she would still be able to continue her care with her specialists. The patient also wanted to apologize to Dr. Raoul Pitch and her staff for her behavior last week. She said that she knows that it was not appropriate. She stated that she was very overwhelmed, scared,on a new medication and that she was having a hard time after the anniversary of her mother's death. She stated that she valued her relationship with Dr. Raoul Pitch and loved coming to [facility]. She stated that she would like to be given a chance to continue that relationship. I told her that I would reach out to Dr. Raoul Pitch and the practice administrator to discuss there dismissal and that I would contact her with a decision She stated that she appreciated being given a chance to apologize and understands that the dismissal outcome may not change. I let her know that someone would contact her next week with the decision and again explained that the dismissal decision may remain the same. She verbalized understanding and thanked me for my time.

## 2020-03-11 ENCOUNTER — Telehealth (HOSPITAL_COMMUNITY): Payer: Self-pay | Admitting: Emergency Medicine

## 2020-03-11 NOTE — Telephone Encounter (Signed)
Attempted to call patient regarding upcoming cardiac CT appointment. °Left message on voicemail with name and callback number °Germani Gavilanes RN Navigator Cardiac Imaging °[facility] °[phone] Office °[phone] Cell ° °

## 2020-03-11 NOTE — Telephone Encounter (Signed)
Spoke with patient and advised the provider had agreed to reverse dismissal. Patient was incredibly appreciative and expressed her gratitude. She also understands that additional demanding/rude behavior would warrant a final dismissal that wouldn't be reversed. I asked her if she still needed to have a physical for her employer and she stated that she had it completed at an urgent care last week.

## 2020-03-11 NOTE — Telephone Encounter (Signed)
Attempted to call patient regarding upcoming cardiac CT appointment. °Left message on voicemail with name and callback number °Jeris Roser RN Navigator Cardiac Imaging °[facility] °[phone] Office °[phone] Cell ° °

## 2020-03-15 ENCOUNTER — Ambulatory Visit (HOSPITAL_COMMUNITY): Payer: BC Managed Care – PPO

## 2020-03-15 ENCOUNTER — Ambulatory Visit (HOSPITAL_COMMUNITY)
Admission: RE | Admit: 2020-03-15 | Discharge: 2020-03-15 | Disposition: A | Discharge status: Home / Self Care | Payer: BC Managed Care – PPO | Source: Ambulatory Visit | Attending: Cardiology | Admitting: Cardiology

## 2020-03-15 ENCOUNTER — Other Ambulatory Visit: Payer: Self-pay

## 2020-03-15 DIAGNOSIS — R0600 Dyspnea, unspecified: Secondary | ICD-10-CM | POA: Insufficient documentation

## 2020-03-15 DIAGNOSIS — R072 Precordial pain: Secondary | ICD-10-CM

## 2020-03-15 DIAGNOSIS — R0609 Other forms of dyspnea: Secondary | ICD-10-CM

## 2020-03-15 MED ORDER — NITROGLYCERIN 0.4 MG SL SUBL: 0.8000 mg | SUBLINGUAL_TABLET | Freq: Once | SUBLINGUAL | Status: AC | PRN

## 2020-03-15 MED ORDER — NITROGLYCERIN 0.4 MG SL SUBL
SUBLINGUAL_TABLET | SUBLINGUAL | Status: AC
  Administered 2020-03-15: 0.8 mg via SUBLINGUAL
  Filled 2020-03-15: qty 2

## 2020-03-15 MED ORDER — IOHEXOL 350 MG/ML SOLN
80.0000 mL | Freq: Once | INTRAVENOUS | Status: AC | PRN
  Administered 2020-03-15: 80 mL via INTRAVENOUS

## 2020-03-18 ENCOUNTER — Telehealth (HOSPITAL_COMMUNITY): Payer: Self-pay | Admitting: Cardiology

## 2020-03-18 NOTE — Telephone Encounter (Signed)
Patient called and cancelled echocardiogram scheduled for 03/19/20 due to she is having COVID symptoms and will call at a later date to reschedule. Order will be removed from the [facility] and when pt calls we can reinstate the order.

## 2020-03-19 ENCOUNTER — Other Ambulatory Visit (HOSPITAL_COMMUNITY): Payer: BC Managed Care – PPO

## 2020-03-25 ENCOUNTER — Other Ambulatory Visit: Payer: BC Managed Care – PPO

## 2020-03-25 ENCOUNTER — Telehealth: Payer: Self-pay | Admitting: Pulmonary Disease

## 2020-03-25 ENCOUNTER — Ambulatory Visit (INDEPENDENT_AMBULATORY_CARE_PROVIDER_SITE_OTHER): Payer: BC Managed Care – PPO

## 2020-03-25 DIAGNOSIS — R042 Hemoptysis: Secondary | ICD-10-CM

## 2020-03-25 LAB — D-DIMER, QUANTITATIVE: D-Dimer, Quant: 0.33 mcg/mL FEU (ref <0.50)

## 2020-03-25 NOTE — Telephone Encounter (Signed)
I have called the pt and she will come in today for the lab and cxr.  Orders have been placed.  cxr was done as STAT.   She will take the delsym and she is aware to avoid all ASA products.  She voiced her understanding that if the hemoptysis gets worse to seek emergency care.  She stated that she is not able to take the prednisone as this makes her not able to sleep and makes her a "crazy person" per the pt.  Will send back to EW as an FYI.  Thanks

## 2020-03-25 NOTE — Telephone Encounter (Signed)
Can she take prednisone, if yes please send in taper (40mg  x 2 days; 30mg  x 2 days; 20mg  x 2 days; 10mg  x 2 days) for asthmatic bronchitis. I would take cough suppressant like delsym twice a day. Avoid aspirin products. Needs CXR and d-dimer. Pulmonary nodule was very small and unchanged from 2019, considered benign. Glad she has a follow-up with Dr. Valeta Harms. If hemoptysis worsens needs ED evaluation.

## 2020-03-25 NOTE — Telephone Encounter (Signed)
Primary Pulmonologist: [facility] Last office visit and with whom: 07/25/19 with Beth What do we see them for (pulmonary problems): asthma Last OV assessment/plan:  Assessment & Plan:   Mild intermittent asthma - Stable; She is doing well, her asthma symptoms are well controlled with SABA. She uses this once daily with improvement. Elevated IgE >100 and eosinophils 200.  - Continue Albuterol HFA 2 puffs every 4-6 hours for shortness of breath/wheezing - Continue Zyrtec qd and Singulair 10mg  at bedtime   - Orders: CXR re: shortness of breath/asthma  - Follow-up: 1 year with Dr. Shirley Friar or sooner if symptoms  Was appointment offered to patient (explain)?  Pt wants recommendations. A f/u appt was scheduled for pt with BI 3/17 which is a f/u after recent CT   Reason for call: Called and spoke with pt who states she has had complaints of chest tightness and due to that she saw cardiology and had a cardiac CT done which ruled out any issues cardiac related. Pt stated on the CT they did see some nodules on her lungs.  Pt stated she has also had SOB, fatigue, and back pain which has been going on for months now. Pt has had hoarseness x1 week.  Pt also stated about 4 days ago she began to cough up mucus that had blood mixed in with it. Pt said the blood does start out bright red but then will become more of a rustic color. Pt said she has only coughed it up twice (she also said she has put a sample in a pill bottle so she could bring to show provider).  Pt denies any complaints of fever as last temp was 97.6. pt has had all 3 of her covid vaccines. Pt's last covid test was 1/27 when she went to [facility] due to the pain in her chest she was having and pt said the covid test did come back negative.  Along with all of pt's other symptoms, she said that she has had a lot of nausea recently and due to this, she is only eating one meal a day due to having no appetite. Pt said when she does eat, she will have  problems with diarrhea.  Pt has not tried any OTC meds other than taking Vitamin D and Vitamin C to see if that would help with how she is feeling. Pt is using her rescue inhaler at least twice a day. Pt is also using her allergy meds of zyrtec and singular as prescribed.  Pt wants recommendations to help with her symptoms.   Allergies  Allergen Reactions  . Abilify [Aripiprazole] Nausea And Vomiting  . Amoxicillin Diarrhea  . Seroquel [Quetiapine Fumarate]     Unknown    Immunization History  Administered Date(s) Administered  . Influenza Inj Mdck Quad Pf 10/26/2018  . Influenza,inj,Quad PF,6+ Mos 10/19/2014, 10/01/2018, 12/29/2019  . Influenza,inj,quad, With Preservative 11/03/2016, 11/18/2017  . Influenza-Unspecified 11/02/2016  . Moderna Sars-Covid-2 Vaccination 04/07/2019, 05/13/2019  . PFIZER(Purple Top)SARS-COV-2 Vaccination 01/02/2020  . PPD Test 01/14/2020  . Pneumococcal Conjugate-13 11/18/2017  . Tdap 01/30/2014  . Zoster Recombinat (Shingrix) 06/18/2019, 09/03/2019

## 2020-03-25 NOTE — Telephone Encounter (Signed)
Ok, thank you. I am out of office this afternoon, if abnormal please have of DOD or Tammy address. Otherwise I will call her tomorrow.   CC: [facility]

## 2020-03-25 NOTE — Telephone Encounter (Signed)
Noted. Will keep encounter in triage to follow up later on results.

## 2020-03-26 MED ORDER — AZITHROMYCIN 250 MG PO TABS: ORAL_TABLET | ORAL | 0 refills | Status: DC

## 2020-03-26 NOTE — Addendum Note (Signed)
Addended by: Valerie Salts on: 03/26/2020 08:49 AM   Modules accepted: Orders

## 2020-03-26 NOTE — Telephone Encounter (Signed)
D-dimer was normal. CXR showed mild bronchitis. Can we please send in zpack if she does not have an allergy

## 2020-03-26 NOTE — Telephone Encounter (Signed)
Ok thanks. Agree lets her for follow up as soon as we can. Make sure the hemoptysis is ok.  Garner Nash, DO [facility] 03/26/2020 6:59 AM

## 2020-03-26 NOTE — Telephone Encounter (Signed)
Spoke with patient. She verbalized understanding of results. She stated that she can take zpaks. I have sent this to [facility] in [place] [facility].   Nothing further needed at time of call.

## 2020-04-15 ENCOUNTER — Ambulatory Visit: Payer: BC Managed Care – PPO | Admitting: Pulmonary Disease

## 2020-04-23 ENCOUNTER — Encounter: Payer: Self-pay | Admitting: Physician Assistant

## 2020-04-23 ENCOUNTER — Telehealth (INDEPENDENT_AMBULATORY_CARE_PROVIDER_SITE_OTHER): Payer: BC Managed Care – PPO | Admitting: Physician Assistant

## 2020-04-23 DIAGNOSIS — F331 Major depressive disorder, recurrent, moderate: Secondary | ICD-10-CM

## 2020-04-23 DIAGNOSIS — F4321 Adjustment disorder with depressed mood: Secondary | ICD-10-CM

## 2020-04-23 DIAGNOSIS — F411 Generalized anxiety disorder: Secondary | ICD-10-CM | POA: Diagnosis not present

## 2020-04-23 MED ORDER — CLONAZEPAM 0.5 MG PO TABS: 0.5000 mg | ORAL_TABLET | Freq: Three times a day (TID) | ORAL | 5 refills | Status: DC | PRN

## 2020-04-23 MED ORDER — PAROXETINE HCL 40 MG PO TABS: 40.0000 mg | ORAL_TABLET | Freq: Every day | ORAL | 1 refills | Status: DC

## 2020-04-23 NOTE — Progress Notes (Signed)
[facility]  Patient ID: Tiffany Horn,  MRN: [id number]  PCP: No primary care provider on file.  Date of Evaluation: 04/23/2020 Time spent:30 minutes  Chief Complaint:  Chief Complaint    Anxiety; Depression; Follow-up; Insomnia     Virtual Visit via Telehealth  I connected with patient by telephone, with their informed consent, and verified patient privacy and that I am speaking with the correct person using two identifiers.  I am private, in my office and the patient is at work.  I discussed the limitations, risks, security and privacy concerns of performing an evaluation and management service by telephone and the availability of in person appointments. I also discussed with the patient that there may be a patient responsible charge related to this service. The patient expressed understanding and agreed to proceed.   I discussed the assessment and treatment plan with the patient. The patient was provided an opportunity to ask questions and all were answered. The patient agreed with the plan and demonstrated an understanding of the instructions.   The patient was advised to call back or seek an in-person evaluation if the symptoms worsen or if the condition fails to improve as anticipated.  I provided 20  30  40  minutes of non-face-to-face time during this encounter.  HISTORY/CURRENT STATUS: HPI For routine med check.  Stays depressed. Since her Mom died last year, feels like her 'light has gone out.' Paxil has helped more than anything ever has.  Is frustrated because nothing works.  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  She of course is still grieving since her mom passed away, but feels like the depression is not only that issue.  She sleeps well when she has things to do but weekends when she does not have a structured schedule she does not sleep as well.  That is no  different than it has been for years.  Has a hard time enjoying things.  Energy and motivation are low.  Cries easily at times.  She will be seeing a new therapist within the next week or so.  She has been in therapy which has been helpful but is going to see someone who specializes in PTSD.  Appetite and weight are stable.  Personal hygiene is normal.  Denies suicidal or homicidal thoughts.  Anxiety is still really high.  She takes 1 Klonopin every morning and occasionally an extra half to 1 during the day.  She does not want to get dependent on it but she definitely needs it right now.  Patient denies increased energy with decreased need for sleep, no increased talkativeness, no racing thoughts, no impulsivity or risky behaviors, no increased spending, no increased libido, no grandiosity, no increased irritability or anger, and no hallucinations.  Denies dizziness, syncope, seizures, numbness, tingling, tremor, tics, unsteady gait, slurred speech, confusion. Denies muscle or joint pain, stiffness, or dystonia.  Individual Medical History/ Review of Systems: Changes? :No    Past medications for mental health diagnoses include:  Cymbalta quit working,  Effexor 'was bad.' Prozac, Zoloft, Wellbutrin and  'everything'  Paxil works best. She can't remember other things she's taken, but "I've tried them all."  Saw a psychiatrist in reference to [place] but was told that she is not a candidate due to a metal plate in her shoulder.  Allergies: Abilify [aripiprazole], Amoxicillin, and Seroquel [quetiapine fumarate]  Current Medications:  Current Outpatient Medications:  .  albuterol (VENTOLIN HFA) 108 (90 Base) MCG/ACT inhaler, Inhale 2 puffs into the lungs every 6 (six) hours as needed for wheezing or shortness of breath., Disp: 6.7 g, Rfl: 5 .  atorvastatin (LIPITOR) 40 MG tablet, Take 1 tablet (40 mg total) by mouth daily., Disp: 90 tablet, Rfl: 3 .  levothyroxine (SYNTHROID) 112 MCG tablet, Take 1  tablet (112 mcg total) by mouth daily., Disp: 90 tablet, Rfl: 3 .  montelukast (SINGULAIR) 10 MG tablet, Take 1 tablet (10 mg total) by mouth at bedtime., Disp: 30 tablet, Rfl: 11 .  rizatriptan (MAXALT) 5 MG tablet, Take 1 tablet (5 mg total) by mouth as needed for migraine. May repeat in 2 hours if needed, Disp: 10 tablet, Rfl: 5 .  VITAMIN D PO, Take 2,000 Units by mouth daily., Disp: , Rfl:  .  alendronate (FOSAMAX) 70 MG tablet, Take 1 tablet (70 mg total) by mouth every 7 (seven) days. Take with a full glass of water on an empty stomach. (Patient not taking: Reported on 04/23/2020), Disp: 4 tablet, Rfl: 11 .  clonazePAM (KLONOPIN) 0.5 MG tablet, Take 1 tablet (0.5 mg total) by mouth 3 (three) times daily as needed for anxiety., Disp: 90 tablet, Rfl: 5 .  HYDROcodone-Homatropine 5-1.5 MG TABS, Take 1 tablet by mouth 2 (two) times daily as needed. (Patient not taking: Reported on 04/23/2020), Disp: 40 tablet, Rfl: 0 .  meloxicam (MOBIC) 15 MG tablet, Take 1 tablet (15 mg total) by mouth daily. (Patient not taking: Reported on 04/23/2020), Disp: 30 tablet, Rfl: 3 .  methocarbamol (ROBAXIN) 500 MG tablet, Take 1 tablet (500 mg total) by mouth 4 (four) times daily as needed for muscle spasms. (Patient not taking: Reported on 04/23/2020), Disp: 60 tablet, Rfl: 1 .  metoprolol tartrate (LOPRESSOR) 100 MG tablet, Take 1 tablet (100 mg total) by mouth once for 1 dose. Take 1 tablet 2 hrs before your Coronary CT, Disp: 1 tablet, Rfl: 0 .  ondansetron (ZOFRAN) 4 MG tablet, Take 1 tablet (4 mg total) by mouth every 8 (eight) hours as needed for up to 10 doses for nausea or vomiting. (Patient not taking: Reported on 04/23/2020), Disp: 10 tablet, Rfl: 0 .  PARoxetine (PAXIL) 40 MG tablet, Take 1 tablet (40 mg total) by mouth daily., Disp: 90 tablet, Rfl: 1 Medication Side Effects: none  Family Medical/ Social History: Changes? No  MENTAL HEALTH EXAM:  Last menstrual period 01/31/2007.There is no height or  weight on file to calculate BMI.  General Appearance: unable to assess  Eye Contact:  unable to assess  Speech:  Clear and Coherent  Volume:  Normal  Mood:  Depressed  Affect:  unable to assess  Thought Process:  Goal Directed and Descriptions of Associations: Intact  Orientation:  Full (Time, Place, and Person)  Thought Content: Logical   Suicidal Thoughts:  No  Homicidal Thoughts:  No  Memory:  WNL  Judgement:  Good  Insight:  Good  Psychomotor Activity:  unable to assess  Concentration:  Concentration: Good and Attention Span: Good  Recall:  Good  Fund of Knowledge: Good  Language: Good  Assets:  Desire for Improvement  ADL's:  Intact  Cognition: WNL  Prognosis:  Good    DIAGNOSES:    ICD-10-CM   1. Grief  F43.21   2. Major depressive disorder, recurrent episode, moderate (HCC)  F33.1   3. Generalized anxiety disorder  F41.1     Receiving Psychotherapy: Yes  RECOMMENDATIONS:  PDMP reviewed. I provided 30 minutes of nonface-to-face time during this encounter, including time spent before and after the visit and reviewing records and chart review. Does not want to try anything new.  We agreed that a lot of what she is feeling is circumstantial so changes may or may not help.  I am glad she will be seeing a new counselor.  Continue Paxil 40 mg p.o. daily.  Check dose. Continue Klonopin 0.5mg  1 po tid prn.  Continue therapy. Return in 3 months.  Donnal Moat, PA-C

## 2020-04-28 ENCOUNTER — Ambulatory Visit: Payer: BC Managed Care – PPO | Admitting: Pulmonary Disease

## 2020-04-28 ENCOUNTER — Other Ambulatory Visit: Payer: Self-pay

## 2020-04-28 ENCOUNTER — Encounter: Payer: Self-pay | Admitting: Pulmonary Disease

## 2020-04-28 VITALS — BP 118/78 | HR 81 | Temp 97.9°F | Ht 67.0 in | Wt 178.4 lb

## 2020-04-28 DIAGNOSIS — R062 Wheezing: Secondary | ICD-10-CM

## 2020-04-28 DIAGNOSIS — R0602 Shortness of breath: Secondary | ICD-10-CM | POA: Diagnosis not present

## 2020-04-28 DIAGNOSIS — R768 Other specified abnormal immunological findings in serum: Secondary | ICD-10-CM

## 2020-04-28 DIAGNOSIS — J4520 Mild intermittent asthma, uncomplicated: Secondary | ICD-10-CM

## 2020-04-28 DIAGNOSIS — T7849XA Other allergy, initial encounter: Secondary | ICD-10-CM

## 2020-04-28 MED ORDER — SPACER/AERO-HOLDING CHAMBERS DEVI: 0 refills | Status: DC

## 2020-04-28 MED ORDER — BREZTRI AEROSPHERE 160-9-4.8 MCG/ACT IN AERO: 2.0000 | INHALATION_SPRAY | Freq: Two times a day (BID) | RESPIRATORY_TRACT | 0 refills | Status: DC

## 2020-04-28 MED ORDER — BREZTRI AEROSPHERE 160-9-4.8 MCG/ACT IN AERO: 2.0000 | INHALATION_SPRAY | Freq: Two times a day (BID) | RESPIRATORY_TRACT | 1 refills | Status: AC

## 2020-04-28 NOTE — Patient Instructions (Addendum)
Thank you for visiting Dr. Valeta Harms at [facility]. Today we recommend the following:  Meds ordered this encounter  Medications  . Budeson-Glycopyrrol-Formoterol (BREZTRI AEROSPHERE) 160-9-4.8 MCG/ACT AERO    Sig: Inhale 2 puffs into the lungs in the morning and at bedtime.    Dispense:  9.6 g    Refill:  0   Breztri samples with spacer New prescription  Inhaler education  Daily Antihistamine Continue singulair   Return in about 3 months (around 07/29/2020).    Please do your part to reduce the spread of COVID-19.

## 2020-04-28 NOTE — Progress Notes (Signed)
Synopsis: Referred in August 2019 for cough by No ref. provider found  Subjective:   PATIENT ID: Tiffany Horn GENDER: female DOB: Oct 09, 1960, MRN: [id number]  Chief Complaint  Patient presents with  . Follow-up    Here to review the results of CT scan    For the past several years she routinely gets bronchitis. She has been hospitalized for pneumonia in the past in [place], [place] about 3 years ago. 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 She travels a lot back and forth from to her mothers house.   She gets bronchitis at least 2 times per year, always in Oct or march and April. She is usually treated with levaquin and stays away from prednisone if possible because it make her irritable. This has been going on and off for at least 30 years. No known allergies, never seen an allergist. Routinely has a deep cough for the past month. Cool and out of the heat and humidity make it better. Going up and down the steps and exertion make it worse. Out side in the heat makes her cough and sob worse. Routinely wheezes. 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   Pets: 12 dogs and 1 cat, combination of in and out Allergies: unknown Occupation: retired Surveyor, quantity: lives alone, no heavy dust or fume exposure, cleans every day with [employer] cleaning solution  Travel: no recent international travel   OV: 10/10/2017  Feeling better since her last visit. With significant exertion she feels winded. When carrying heavy stuff she feels SOB. She is using her albuterol still a few times per day. She has cough, dry and feels fatigued. She does have GERD and occasionally taking her PPI. She only has been using it as needed.  Overall it seems as if her symptomatology is not fully controlled.  She does admit to occasional nocturnal symptoms, nocturnal cough.  Denies fevers, sputum production.  OV 04/28/2020: Here today for follow-up regarding her asthma symptoms.   Unfortunately not on a maintenance regimen anymore.  Had difficulty affording medications for some time.  Still uses her albuterol as needed.  Takes her Singulair daily.  Not taking a daily antihistamine.  She is trying to start taking better care of herself.  She has also noticed increase in her weight recently.  She still has nocturnal cough and occasional wheezing.  She does have shortness of breath with exertion.   Past Medical History:  Diagnosis Date  . Anxiety   . Arthritis    knees and spine, shoulder  . Asthma   . Bronchitis    hx - recurrent  . Complication of anesthesia    waking up is not easy  . COVID-19   . Depression   . Elevated IgE level 09/12/2017   09/12/2017 IgE 195  . H/O miscarriage, not currently pregnant   . Hx of irritable bowel syndrome    x2  . Hyperlipidemia    diet controlled - no medication  . Hypertension    not taking any meds at present - under control per patient  . Hypokalemia    with PNA admission (2.5)  . Migraines   . Pneumonia    4 episodes; hosp. admission 2014  . PONV (postoperative nausea and vomiting)   . Thyroid disease      Family History  Problem Relation Age of Onset  . Breast cancer Mother   . Diabetes Mother   .  Heart disease Mother   . Diabetes Father   . Dementia Father   . Colon cancer Father   . Stroke Paternal Grandmother   . Diabetes Paternal Grandmother      Social History   Socioeconomic History  . Marital status: Divorced    Spouse name: Not on file  . Number of children: 0  . Years of education: Not on file  . Highest education level: Not on file  Occupational History  . Occupation: Control and instrumentation engineer  Tobacco Use  . Smoking status: Never Smoker  . Smokeless tobacco: Never Used  Vaping Use  . Vaping Use: Never used  Substance and Sexual Activity  . Alcohol use: Yes    Alcohol/week: 2.0 standard drinks    Types: 2 Glasses of wine per week  . Drug use: No  . Sexual activity: Not Currently     Partners: Male    Birth control/protection: Post-menopausal  Other Topics Concern  . Not on file  Social History Narrative   She is originally from [facility]. She has traveled to [facility], [place], [place], [place], [place], [place], [place], [place], [place]. No international travel. She has dogs. No prior bird, mold, or recent hot tub exposure. She hasn't used her hot tub in 1.5 years. 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 Previously enjoyed gardening and playing tennis. Helps to care for her mother.   Social Determinants of Health   Financial Resource Strain: Not on file  Food Insecurity: Not on file  Transportation Needs: Not on file  Physical Activity: Not on file  Stress: Not on file  Social Connections: Not on file  Intimate Partner Violence: Not on file     Allergies  Allergen Reactions  . Abilify [Aripiprazole] Nausea And Vomiting  . Amoxicillin Diarrhea  . Seroquel [Quetiapine Fumarate]     Unknown     Outpatient Medications Prior to Visit  Medication Sig Dispense Refill  . albuterol (VENTOLIN HFA) 108 (90 Base) MCG/ACT inhaler Inhale 2 puffs into the lungs every 6 (six) hours as needed for wheezing or shortness of breath. 6.7 g 5  . atorvastatin (LIPITOR) 40 MG tablet Take 1 tablet (40 mg total) by mouth daily. 90 tablet 3  . clonazePAM (KLONOPIN) 0.5 MG tablet Take 1 tablet (0.5 mg total) by mouth 3 (three) times daily as needed for anxiety. 90 tablet 5  . levothyroxine (SYNTHROID) 112 MCG tablet Take 1 tablet (112 mcg total) by mouth daily. 90 tablet 3  . meloxicam (MOBIC) 15 MG tablet Take 1 tablet (15 mg total) by mouth daily. 30 tablet 3  . methocarbamol (ROBAXIN) 500 MG tablet Take 1 tablet (500 mg total) by mouth 4 (four) times daily as needed for muscle spasms. 60 tablet 1  . montelukast (SINGULAIR) 10 MG tablet Take 1 tablet (10 mg total) by mouth at bedtime. 30 tablet 11  . ondansetron (ZOFRAN) 4 MG tablet Take 1 tablet (4 mg total) by mouth  every 8 (eight) hours as needed for up to 10 doses for nausea or vomiting. 10 tablet 0  . PARoxetine (PAXIL) 40 MG tablet Take 1 tablet (40 mg total) by mouth daily. 90 tablet 1  . rizatriptan (MAXALT) 5 MG tablet Take 1 tablet (5 mg total) by mouth as needed for migraine. May repeat in 2 hours if needed 10 tablet 5  . VITAMIN D PO Take 2,000 Units by mouth daily.    Marland Kitchen alendronate (FOSAMAX)  70 MG tablet Take 1 tablet (70 mg total) by mouth every 7 (seven) days. Take with a full glass of water on an empty stomach. (Patient not taking: No sig reported) 4 tablet 11  . HYDROcodone-Homatropine 5-1.5 MG TABS Take 1 tablet by mouth 2 (two) times daily as needed. (Patient not taking: No sig reported) 40 tablet 0  . metoprolol tartrate (LOPRESSOR) 100 MG tablet Take 1 tablet (100 mg total) by mouth once for 1 dose. Take 1 tablet 2 hrs before your Coronary CT 1 tablet 0   No facility-administered medications prior to visit.    Review of Systems  Constitutional: Negative for chills, fever, malaise/fatigue and weight loss.  HENT: Negative for hearing loss, sore throat and tinnitus.   Eyes: Negative for blurred vision and double vision.  Respiratory: Positive for cough (nocturnal), shortness of breath and wheezing. Negative for hemoptysis, sputum production and stridor.   Cardiovascular: Negative for chest pain, palpitations, orthopnea, leg swelling and PND.  Gastrointestinal: Negative for abdominal pain, constipation, diarrhea, heartburn, nausea and vomiting.  Genitourinary: Negative for dysuria, hematuria and urgency.  Musculoskeletal: Negative for joint pain and myalgias.  Skin: Negative for itching and rash.  Neurological: Negative for dizziness, tingling, weakness and headaches.  Endo/Heme/Allergies: Negative for environmental allergies. Does not bruise/bleed easily.  Psychiatric/Behavioral: Negative for depression. The patient is not nervous/anxious and does not have insomnia.   All other systems  reviewed and are negative.    Objective:  Physical Exam Vitals reviewed.  Constitutional:      General: She is not in acute distress.    Appearance: She is well-developed. She is obese.  HENT:     Head: Normocephalic and atraumatic.  Eyes:     General: No scleral icterus.    Conjunctiva/sclera: Conjunctivae normal.     Pupils: Pupils are equal, round, and reactive to light.  Neck:     Vascular: No JVD.     Trachea: No tracheal deviation.  Cardiovascular:     Rate and Rhythm: Normal rate and regular rhythm.     Heart sounds: Normal heart sounds. No murmur heard.   Pulmonary:     Effort: Pulmonary effort is normal. No tachypnea, accessory muscle usage or respiratory distress.     Breath sounds: Normal breath sounds. No stridor. No wheezing, rhonchi or rales.  Abdominal:     General: Bowel sounds are normal. There is no distension.     Palpations: Abdomen is soft.     Tenderness: There is no abdominal tenderness.  Musculoskeletal:        General: No tenderness.     Cervical back: Neck supple.  Lymphadenopathy:     Cervical: No cervical adenopathy.  Skin:    General: Skin is warm and dry.     Capillary Refill: Capillary refill takes less than 2 seconds.     Findings: No rash.  Neurological:     Mental Status: She is alert and oriented to person, place, and time.  Psychiatric:        Behavior: Behavior normal.     Vitals:   04/28/20 1520  BP: 118/78  Pulse: 81  Temp: 97.9 F (36.6 C)  TempSrc: Tympanic  SpO2: 96%  Weight: 178 lb 6 oz (80.9 kg)  Height: 5\' 7"  (1.702 m)   96% on RA BMI Readings from Last 3 Encounters:  04/28/20 27.94 kg/m  03/02/20 28.25 kg/m  02/26/20 28.08 kg/m   Wt Readings from Last 3 Encounters:  04/28/20 178 lb 6 oz (  80.9 kg)  03/02/20 175 lb (79.4 kg)  02/26/20 174 lb (78.9 kg)   CBC    Component Value Date/Time   WBC 5.7 02/26/2020 0905   RBC 4.51 02/26/2020 0905   HGB 13.2 02/26/2020 0905   HCT 40.7 02/26/2020 0905    PLT 225 02/26/2020 0905   MCV 90.2 02/26/2020 0905   MCV 91.9 03/14/2013 1042   MCH 29.3 02/26/2020 0905   MCHC 32.4 02/26/2020 0905   RDW 12.1 02/26/2020 0905   LYMPHSABS 1.4 02/26/2020 0905   MONOABS 0.5 02/26/2020 0905   EOSABS 0.2 02/26/2020 0905   BASOSABS 0.1 02/26/2020 0905   09/07/2017: Rest testing positive for [place] grass, [place] grass, [place]wood, ragweed, cedar trees IgE: 195 IgG, IgA, IgM: All within normal limits  Chest Imaging: CXR from 07/17/2017 - The patients images have been independently reviewed by me.  There is evidence of thoracic vertebral height loss at T12  Pulmonary Functions Testing Results: 10/10/2017: FVC 3.2 L 93% predicted post BD response FEV1 6 2.76 L, 103% predicted post BD response TLC 123% predicted DLCO 106% predicted  FeNO: None   Pathology: None   Echocardiogram: None   Heart Catheterization: None     Assessment & Plan:   Mild intermittent asthma without complication  Wheezing  SOB (shortness of breath)  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)  Elevated IgE level  Discussion:  60 year old moderate persistent asthma symptoms, chronic cough, recurrent episodes of bronchitis, prior positive regional allergy panel, positive Rast, significantly elevated IgE at 195. Prior pulmonary function test with evidence of air trapping no significant bronchodilator response and a DLCO 106%.  At this time patient is not on any maintenance inhalers.  She still having breakthrough symptoms and is not well controlled.  weight gain is also playing a role in her shortness of breath and flaring of her asthma.  Plan: Restart on maintenance inhaler regimen. Patient was given breztri samples as well as a co-pay card. Continue Singulair Restart daily antihistamine Continue daily vitamin D supplementation. Continue PPI and management of reflux. Encouraged weight loss and diet modification. Patient's goal to lose 5 pounds before the next time we  see her. Return to clinic in 3 months.   Current Outpatient Medications:  .  albuterol (VENTOLIN HFA) 108 (90 Base) MCG/ACT inhaler, Inhale 2 puffs into the lungs every 6 (six) hours as needed for wheezing or shortness of breath., Disp: 6.7 g, Rfl: 5 .  atorvastatin (LIPITOR) 40 MG tablet, Take 1 tablet (40 mg total) by mouth daily., Disp: 90 tablet, Rfl: 3 .  clonazePAM (KLONOPIN) 0.5 MG tablet, Take 1 tablet (0.5 mg total) by mouth 3 (three) times daily as needed for anxiety., Disp: 90 tablet, Rfl: 5 .  levothyroxine (SYNTHROID) 112 MCG tablet, Take 1 tablet (112 mcg total) by mouth daily., Disp: 90 tablet, Rfl: 3 .  meloxicam (MOBIC) 15 MG tablet, Take 1 tablet (15 mg total) by mouth daily., Disp: 30 tablet, Rfl: 3 .  methocarbamol (ROBAXIN) 500 MG tablet, Take 1 tablet (500 mg total) by mouth 4 (four) times daily as needed for muscle spasms., Disp: 60 tablet, Rfl: 1 .  montelukast (SINGULAIR) 10 MG tablet, Take 1 tablet (10 mg total) by mouth at bedtime., Disp: 30 tablet, Rfl: 11 .  ondansetron (ZOFRAN) 4 MG tablet, Take 1 tablet (4 mg total) by mouth every 8 (eight) hours as needed for up to 10 doses for nausea or vomiting., Disp: 10 tablet, Rfl: 0 .  PARoxetine (PAXIL) 40 MG tablet,  Take 1 tablet (40 mg total) by mouth daily., Disp: 90 tablet, Rfl: 1 .  rizatriptan (MAXALT) 5 MG tablet, Take 1 tablet (5 mg total) by mouth as needed for migraine. May repeat in 2 hours if needed, Disp: 10 tablet, Rfl: 5 .  VITAMIN D PO, Take 2,000 Units by mouth daily., Disp: , Rfl:    Garner Nash, DO [facility] 04/28/2020 3:34 PM

## 2020-05-05 ENCOUNTER — Telehealth (INDEPENDENT_AMBULATORY_CARE_PROVIDER_SITE_OTHER): Payer: BC Managed Care – PPO | Admitting: Family Medicine

## 2020-05-05 ENCOUNTER — Encounter: Payer: Self-pay | Admitting: Family Medicine

## 2020-05-05 DIAGNOSIS — B9689 Other specified bacterial agents as the cause of diseases classified elsewhere: Secondary | ICD-10-CM | POA: Diagnosis not present

## 2020-05-05 DIAGNOSIS — J029 Acute pharyngitis, unspecified: Secondary | ICD-10-CM

## 2020-05-05 DIAGNOSIS — J019 Acute sinusitis, unspecified: Secondary | ICD-10-CM | POA: Diagnosis not present

## 2020-05-05 LAB — POCT INFLUENZA A/B
Influenza A, POC: NEGATIVE
Influenza B, POC: NEGATIVE

## 2020-05-05 LAB — POCT RAPID STREP A (OFFICE): Rapid Strep A Screen: NEGATIVE

## 2020-05-05 NOTE — Progress Notes (Signed)
VIRTUAL VISIT VIA VIDEO  I connected with Tiffany Horn on 05/05/20 at 11:00 AM EDT by elemedicine application and verified that I am speaking with the correct person using two identifiers. Location patient: Home Location provider: [facility], Office Persons participating in the virtual visit: Patient, Dr. Raoul Pitch and Darnell Level. Cesar, CMA  I discussed the limitations of evaluation and management by telemedicine and the availability of in person appointments. The patient expressed understanding and agreed to proceed.   SUBJECTIVE Chief Complaint  Patient presents with  . Sore Throat    Pt c/o sore throat, HA, sinus pressure, ear pain, nasal congestion, chills x 5 days progressively worsen     HPI: Tiffany Horn is a 60 y.o. female present for acute illness x 5 days of sore throat, headache, sinus pressure, ear pain and nasal congestion. Pt reports there has benn no improvement in her symptoms. Her symptoms have worsened daily.  Last seen by her pulmonologist for routine visit 6 days ago. Normal exam at that time.  Taking zyrtec and floanse- treating as allergies. Chills during night She denies fever, wheezing, shortness of breath or cough. She endorses nausea and change in taste perception- (metallic). Exposures:  works in a school Vaccines: Covid vaccines x3 covid history: no h/o Home covid test negative yesterday. Reports compliance with asthma inhalers, Singulair, Flonase and antihistamine  ROS: See pertinent positives and negatives per HPI.  Patient Active Problem List   Diagnosis Date Noted  . Family history of heart disease 09/03/2019  . Mild intermittent asthma 07/25/2019  . Osteoporosis 06/12/2019  . Elevated LFTs 06/06/2019  . Hematuria 06/06/2019  . Frequent urination at night 06/06/2019  . Elevated IgE level 09/12/2017  . 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 Headache, migraine 02/15/2015  . Vitamin D deficiency 10/19/2014  . Encounter for  preventive health examination 10/19/2014  . Depression with anxiety 09/02/2014  . Dyspnea on exertion 08/26/2014  . Hyperlipidemia 08/18/2014  . Hypothyroidism 08/18/2014  . Obesity (BMI 30-39.9) 08/18/2014    Social History   Tobacco Use  . Smoking status: Never Smoker  . Smokeless tobacco: Never Used  Substance Use Topics  . Alcohol use: Yes    Alcohol/week: 2.0 standard drinks    Types: 2 Glasses of wine per week    Current Outpatient Medications:  .  albuterol (VENTOLIN HFA) 108 (90 Base) MCG/ACT inhaler, Inhale 2 puffs into the lungs every 6 (six) hours as needed for wheezing or shortness of breath., Disp: 6.7 g, Rfl: 5 .  atorvastatin (LIPITOR) 40 MG tablet, Take 1 tablet (40 mg total) by mouth daily., Disp: 90 tablet, Rfl: 3 .  clonazePAM (KLONOPIN) 0.5 MG tablet, Take 1 tablet (0.5 mg total) by mouth 3 (three) times daily as needed for anxiety., Disp: 90 tablet, Rfl: 5 .  levothyroxine (SYNTHROID) 112 MCG tablet, Take 1 tablet (112 mcg total) by mouth daily., Disp: 90 tablet, Rfl: 3 .  montelukast (SINGULAIR) 10 MG tablet, Take 1 tablet (10 mg total) by mouth at bedtime., Disp: 30 tablet, Rfl: 11 .  ondansetron (ZOFRAN) 4 MG tablet, Take 1 tablet (4 mg total) by mouth every 8 (eight) hours as needed for up to 10 doses for nausea or vomiting., Disp: 10 tablet, Rfl: 0 .  PARoxetine (PAXIL) 40 MG tablet, Take 1 tablet (40 mg total) by mouth daily., Disp: 90 tablet, Rfl: 1 .  rizatriptan (MAXALT) 5 MG tablet, Take 1 tablet (5 mg total) by mouth as needed for migraine. May  repeat in 2 hours if needed, Disp: 10 tablet, Rfl: 5 .  Spacer/Aero-Holding Chambers DEVI, Use daily with inhaler, Disp: 1 each, Rfl: 0 .  VITAMIN D PO, Take 2,000 Units by mouth daily., Disp: , Rfl:  .  Budeson-Glycopyrrol-Formoterol (BREZTRI AEROSPHERE) 160-9-4.8 MCG/ACT AERO, Inhale 2 puffs into the lungs in the morning and at bedtime., Disp: 5.9 g, Rfl: 1  Allergies  Allergen Reactions  . Abilify  [Aripiprazole] Nausea And Vomiting  . Amoxicillin Diarrhea  . Seroquel [Quetiapine Fumarate]     Unknown    OBJECTIVE: LMP 01/31/2007  Gen: No acute distress. Nontoxic in appearance.  HENT: AT. [place].  MMM.  Eyes:Pupils Equal Round Reactive to light, Extraocular movements intact,  Conjunctiva without redness, discharge or icterus. Chest: Cough not present on exam Skin: no rashes, purpura or petechiae.  Neuro:  Alert. Oriented x3  Psych: Normal affect and demeanor. Normal speech. Normal thought content and judgment.  ASSESSMENT AND PLAN: Tiffany Horn is a 60 y.o. female present for  Pharyngitis, unspecified etiology/acute rhinosinusitis Rest, hydrate.  Continue normal allergy and asthma regimen  Start Mucinex per label instructions Drive up testing will be arranged today for: - Novel Coronavirus, NAA ([facility]) - POCT rapid strep A - POCT Influenza A/B Once point-of-care tests results received, patient will be called and initiation of treatment will be discussed at that time based on those results. Follow-up in 2 weeks if no improvement is appreciated, sooner if worsening   Howard Pouch, DO 05/05/2020   No follow-ups on file.  Orders Placed This Encounter  Procedures  . Novel Coronavirus, NAA ([facility])  . POCT rapid strep A  . POCT Influenza A/B   No orders of the defined types were placed in this encounter.  Referral Orders  No referral(s) requested today

## 2020-05-06 ENCOUNTER — Telehealth: Payer: Self-pay | Admitting: Family Medicine

## 2020-05-06 LAB — NOVEL CORONAVIRUS, NAA: SARS-CoV-2, NAA: NOT DETECTED

## 2020-05-06 LAB — SARS-COV-2, NAA 2 DAY TAT

## 2020-05-06 MED ORDER — DOXYCYCLINE HYCLATE 100 MG PO TABS: 100.0000 mg | ORAL_TABLET | Freq: Two times a day (BID) | ORAL | 0 refills | Status: DC

## 2020-05-06 NOTE — Telephone Encounter (Signed)
Please apologize to patient that we could not get back to her yesterday afternoon.  Are not work completely crashed and we did not have access to computers or our telephones. Please inform patient her rapid strep and influenza tests are negative. Covid test is pending.  I have called in doxycycline antibiotic twice daily x10 days for her.  This is the medication that we have used in the past to treat her sinus infections.  Continue her allergy and asthma regimen as well as Mucinex over-the-counter to help with symptoms.

## 2020-05-06 NOTE — Telephone Encounter (Signed)
Left patient detailed message per DPR  

## 2020-07-12 ENCOUNTER — Telehealth: Payer: Self-pay

## 2020-07-12 NOTE — Telephone Encounter (Signed)
Please call patient and inform her her mammogram results are normal.

## 2020-07-12 NOTE — Telephone Encounter (Signed)
LVM for pt to CB regarding results.  

## 2020-07-12 NOTE — Telephone Encounter (Signed)
Received Mammogram results from [place] on 07/08/20. Placing on PCP desk for review.

## 2020-07-13 ENCOUNTER — Encounter: Payer: Self-pay | Admitting: Gastroenterology

## 2020-07-13 NOTE — Telephone Encounter (Signed)
Spoke with pt regarding labs and instructions.   

## 2020-09-06 ENCOUNTER — Ambulatory Visit (AMBULATORY_SURGERY_CENTER): Payer: BC Managed Care – PPO | Admitting: *Deleted

## 2020-09-06 ENCOUNTER — Other Ambulatory Visit: Payer: Self-pay

## 2020-09-06 VITALS — Ht 66.5 in | Wt 175.0 lb

## 2020-09-06 DIAGNOSIS — Z800 Family history of malignant neoplasm of digestive organs: Secondary | ICD-10-CM

## 2020-09-06 MED ORDER — CLENPIQ 10-3.5-12 MG-GM -GM/160ML PO SOLN: 1.0000 | ORAL | 0 refills | Status: DC

## 2020-09-06 NOTE — Progress Notes (Signed)
No egg or soy allergy known to patient  No issues with past sedation with any surgeries or procedures- PT HAS EXPERIENCED HARD TO WAKE POST OP  Patient denies ever being told they had issues or difficulty with intubation  No FH of Malignant Hyperthermia No diet pills per patient No home 02 use per patient  No blood thinners per patient  Pt denies issues with constipation  No A fib or A flutter  EMMI video to pt or via [place] 19 guidelines implemented in PV today with Pt and RN  Pt is fully vaccinated  for Covid   CLENPIQ Coupon given to pt in PV today , Code to Pharmacy and  NO PA's for preps discussed with pt In PV today  Discussed with pt there will be an out-of-pocket cost for prep and that varies from $0 to 70 dollars   Due to the COVID-19 pandemic we are asking patients to follow certain guidelines.  Pt aware of COVID protocols and [facility] guidelines

## 2020-09-09 ENCOUNTER — Other Ambulatory Visit: Payer: Self-pay

## 2020-09-09 ENCOUNTER — Encounter: Payer: Self-pay | Admitting: Family Medicine

## 2020-09-09 ENCOUNTER — Ambulatory Visit: Payer: BC Managed Care – PPO | Admitting: Family Medicine

## 2020-09-09 VITALS — BP 112/75 | HR 78 | Temp 98.4°F | Resp 16 | Ht 67.0 in | Wt 181.4 lb

## 2020-09-09 DIAGNOSIS — T63461A Toxic effect of venom of wasps, accidental (unintentional), initial encounter: Secondary | ICD-10-CM | POA: Diagnosis not present

## 2020-09-09 MED ORDER — MUPIROCIN 2 % EX OINT: 1.0000 "application " | TOPICAL_OINTMENT | Freq: Three times a day (TID) | CUTANEOUS | 0 refills | Status: DC

## 2020-09-09 NOTE — Progress Notes (Signed)
OFFICE VISIT  09/09/2020  CC:  Chief Complaint  Patient presents with   Bee sting    X 3 weeks, tried using antibiotic ointment for relief. Located on L upper side of back. Has been itchy and noticed red splotches yesterday     HPI:    Patient is a 60 y.o. Caucasian female with hx of persistent asthma who presents for bee sting. Stung by wasp on L upper back region, no probs afterward but some itching in the area.  She can't reach the area well enough to scratch it.  She saw in mirror today and said it looked red so she wanted to get it checked out. No drainage, no blood from the spot.  and noted it looking a lot worse today.   Past Medical History:  Diagnosis Date   Allergy    Anxiety    Arthritis    knees and spine, shoulder   Asthma    Bronchitis    hx - recurrent   Complication of anesthesia    waking up is not easy   Depression    Elevated IgE level 09/12/2017   09/12/2017 IgE 195   H/O miscarriage, not currently pregnant    Hx of irritable bowel syndrome    x2   Hyperlipidemia    diet controlled - no medication   Hypertension    not taking any meds at present - under control per patient   Hypokalemia    with PNA admission (2.5)   Migraines    Pneumonia    4 episodes; hosp. admission 2014   PONV (postoperative nausea and vomiting)    Thyroid disease    UC (ulcerative colitis) [facility])    DX'D 2021    Past Surgical History:  Procedure Laterality Date   BREAST SURGERY     implants, then had them removed   COLONOSCOPY     greater 10 yrs ago - ? [facility]-2017 LAST   DILATION AND CURETTAGE OF UTERUS     LAPAROSCOPIC ABDOMINAL EXPLORATION  01/31/1992   endometriosis   ORIF HUMERUS FRACTURE Left 04/01/2013   DR Ninfa Linden - shoulder   ORIF HUMERUS FRACTURE Left 04/01/2013   Procedure: OPEN REDUCTION INTERNAL FIXATION (ORIF) LEFT PROXIMAL HUMERUS FRACTURE;  Surgeon: Mcarthur Rossetti, MD;  Location: [facility];  Service: Orthopedics;  Laterality: Left;    TONSILLECTOMY AND ADENOIDECTOMY      Outpatient Medications Prior to Visit  Medication Sig Dispense Refill   Ascorbic Acid (VITAMIN C) 1000 MG tablet Take 1,000 mg by mouth daily.     B Complex Vitamins (B COMPLEX PO) Take by mouth.     Budeson-Glycopyrrol-Formoterol (BREZTRI AEROSPHERE) 160-9-4.8 MCG/ACT AERO Inhale 2 puffs into the lungs in the morning and at bedtime. 5.9 g 1   clonazePAM (KLONOPIN) 0.5 MG tablet Take 1 tablet (0.5 mg total) by mouth 3 (three) times daily as needed for anxiety. 90 tablet 5   levothyroxine (SYNTHROID) 112 MCG tablet Take 1 tablet (112 mcg total) by mouth daily. 90 tablet 3   montelukast (SINGULAIR) 10 MG tablet Take 1 tablet (10 mg total) by mouth at bedtime. 30 tablet 11   PARoxetine (PAXIL) 40 MG tablet Take 1 tablet (40 mg total) by mouth daily. 90 tablet 1   rizatriptan (MAXALT) 5 MG tablet Take 1 tablet (5 mg total) by mouth as needed for migraine. May repeat in 2 hours if needed 10 tablet 5   VITAMIN D PO Take 2,000 Units by mouth daily.  albuterol (VENTOLIN HFA) 108 (90 Base) MCG/ACT inhaler Inhale 2 puffs into the lungs every 6 (six) hours as needed for wheezing or shortness of breath. (Patient not taking: Reported on 09/09/2020) 6.7 g 5   atorvastatin (LIPITOR) 40 MG tablet Take 1 tablet (40 mg total) by mouth daily. (Patient not taking: No sig reported) 90 tablet 3   Sod Picosulfate-Mag Ox-Cit Acd (CLENPIQ) 10-3.5-12 MG-GM -GM/160ML SOLN Take 1 kit by mouth as directed. PT HAS COUPON- NO PA'S (Patient not taking: Reported on 09/09/2020) 320 mL 0   Spacer/Aero-Holding Chambers DEVI Use daily with inhaler (Patient not taking: Reported on 09/09/2020) 1 each 0   No facility-administered medications prior to visit.    Allergies  Allergen Reactions   Abilify [Aripiprazole] Nausea And Vomiting   Amoxicillin Diarrhea   Seroquel [Quetiapine Fumarate]     Unknown    ROS As per HPI  PE: Vitals with BMI 09/09/2020 09/06/2020 04/28/2020  Height 5' 7"  5'  6.5" 5' 7"   Weight 181 lbs 6 oz 175 lbs 178 lbs 6 oz  BMI 28.4 09.81 19.14  Systolic 782 - 956  Diastolic 75 - 78  Pulse 78 - 81   Exam chaperoned by Kavin Leech, CMA.   Gen: Alert, well appearing.  Patient is oriented to person, place, time, and situation. AFFECT: pleasant, lucid thought and speech. L scapula area with 2-3 mm scab with minimal erythema on it. No surrounding skin changes, no tenderness or warmth.  No hives, pustules, or vesicles.  LABS:    Chemistry      Component Value Date/Time   NA 139 02/26/2020 0905   K 3.7 02/26/2020 0905   CL 106 02/26/2020 0905   CO2 25 02/26/2020 0905   BUN 12 02/26/2020 0905   CREATININE 0.77 02/26/2020 0905   CREATININE 0.92 06/06/2019 1435      Component Value Date/Time   CALCIUM 9.0 02/26/2020 0905   ALKPHOS 63 02/26/2020 0905   AST 20 02/26/2020 0905   ALT 18 02/26/2020 0905   BILITOT 0.7 02/26/2020 0905     Lab Results  Component Value Date   WBC 5.7 02/26/2020   HGB 13.2 02/26/2020   HCT 40.7 02/26/2020   MCV 90.2 02/26/2020   PLT 225 02/26/2020    IMPRESSION AND PLAN:  Wasp sting, no signif sign of infection.  A bit slow to heal, likely b/c area is abraded by bra/clothing. I rx'd bactroban ointment to apply bid-tid and cover with bandaid for a few days.  An After Visit Summary was printed and given to the patient.  FOLLOW UP: No follow-ups on file.  Signed:  Crissie Sickles, MD           09/09/2020

## 2020-09-20 ENCOUNTER — Encounter: Payer: Self-pay | Admitting: Gastroenterology

## 2020-09-20 ENCOUNTER — Ambulatory Visit (AMBULATORY_SURGERY_CENTER): Payer: BC Managed Care – PPO | Admitting: Gastroenterology

## 2020-09-20 ENCOUNTER — Other Ambulatory Visit: Payer: Self-pay

## 2020-09-20 ENCOUNTER — Encounter: Payer: BC Managed Care – PPO | Admitting: Gastroenterology

## 2020-09-20 VITALS — BP 128/83 | HR 68 | Temp 98.6°F | Resp 13 | Ht 67.0 in | Wt 175.0 lb

## 2020-09-20 DIAGNOSIS — D126 Benign neoplasm of colon, unspecified: Secondary | ICD-10-CM

## 2020-09-20 DIAGNOSIS — K529 Noninfective gastroenteritis and colitis, unspecified: Secondary | ICD-10-CM

## 2020-09-20 DIAGNOSIS — D122 Benign neoplasm of ascending colon: Secondary | ICD-10-CM | POA: Diagnosis not present

## 2020-09-20 DIAGNOSIS — K635 Polyp of colon: Secondary | ICD-10-CM | POA: Diagnosis not present

## 2020-09-20 DIAGNOSIS — D120 Benign neoplasm of cecum: Secondary | ICD-10-CM

## 2020-09-20 DIAGNOSIS — Z800 Family history of malignant neoplasm of digestive organs: Secondary | ICD-10-CM

## 2020-09-20 MED ORDER — SODIUM CHLORIDE 0.9 % IV SOLN: 500.0000 mL | Freq: Once | INTRAVENOUS | Status: DC

## 2020-09-20 NOTE — Progress Notes (Signed)
Pt's states no medical or surgical changes since previsit or office visit.   CW VS

## 2020-09-20 NOTE — Progress Notes (Signed)
Report given to PACU, vss 

## 2020-09-20 NOTE — Patient Instructions (Signed)
Resume previous diet and continue present medications. No Aspirin, Ibuprofen, Naproxen, or other non-steroidal anti-inflammatory drugs for 2 weeks. Repeat colonoscopy in 1 year for surveillance after piecemeal polypectomy.  YOU HAD AN ENDOSCOPIC PROCEDURE TODAY AT [facility]:   Refer to the procedure report that was given to you for any specific questions about what was found during the examination.  If the procedure report does not answer your questions, please call your gastroenterologist to clarify.  If you requested that your care partner not be given the details of your procedure findings, then the procedure report has been included in a sealed envelope for you to review at your convenience later.  YOU SHOULD EXPECT: Some feelings of bloating in the abdomen. Passage of more gas than usual.  Walking can help get rid of the air that was put into your GI tract during the procedure and reduce the bloating. If you had a lower endoscopy (such as a colonoscopy or flexible sigmoidoscopy) you may notice spotting of blood in your stool or on the toilet paper. If you underwent a bowel prep for your procedure, you may not have a normal bowel movement for a few days.  Please Note:  You might notice some irritation and congestion in your nose or some drainage.  This is from the oxygen used during your procedure.  There is no need for concern and it should clear up in a day or so.  SYMPTOMS TO REPORT IMMEDIATELY:  Following lower endoscopy (colonoscopy or flexible sigmoidoscopy):  Excessive amounts of blood in the stool  Significant tenderness or worsening of abdominal pains  Swelling of the abdomen that is new, acute  Fever of 100F or higher   For urgent or emergent issues, a gastroenterologist can be reached at any hour by calling [phone]. Do not use MyChart messaging for urgent concerns.    DIET:  We do recommend a small meal at first, but then you may proceed to your regular  diet.  Drink plenty of fluids but you should avoid alcoholic beverages for 24 hours.  ACTIVITY:  You should plan to take it easy for the rest of today and you should NOT DRIVE or use heavy machinery until tomorrow (because of the sedation medicines used during the test).    FOLLOW UP: Our staff will call the number listed on your records 48-72 hours following your procedure to check on you and address any questions or concerns that you may have regarding the information given to you following your procedure. If we do not reach you, we will leave a message.  We will attempt to reach you two times.  During this call, we will ask if you have developed any symptoms of COVID 19. If you develop any symptoms (ie: fever, flu-like symptoms, shortness of breath, cough etc.) before then, please call [phone].  If you test positive for Covid 19 in the 2 weeks post procedure, please call and report this information to [place].    If any biopsies were taken you will be contacted by phone or by letter within the next 1-3 weeks.  Please call us at [phone] if you have not heard about the biopsies in 3 weeks.    SIGNATURES/CONFIDENTIALITY: You and/or your care partner have signed paperwork which will be entered into your electronic medical record.  These signatures attest to the fact that that the information above on your After Visit Summary has been reviewed and is understood.  Full responsibility of the  confidentiality of this discharge information lies with you and/or your care-partner.  

## 2020-09-20 NOTE — Progress Notes (Signed)
Called to room to assist during endoscopic procedure.  Patient ID and intended procedure confirmed with present staff. Received instructions for my participation in the procedure from the performing physician.  

## 2020-09-20 NOTE — Op Note (Signed)
[place] Patient Name: Tiffany Horn Procedure Date: 09/20/2020 8:33 AM MRN: [id number] Endoscopist: [name] , MD Age: 60 Referring MD:  Date of Birth: May 15, 1960 Gender: Female Account #: [id number] Procedure:                Colonoscopy Indications:              Screening in patient at increased risk: Family                            history of 1st-degree relative with colorectal                            cancer Medicines:                Monitored Anesthesia Care Procedure:                Pre-Anesthesia Assessment:                           - Prior to the procedure, a History and Physical                            was performed, and patient medications and                            allergies were reviewed. The patient's tolerance of                            previous anesthesia was also reviewed. The risks                            and benefits of the procedure and the sedation                            options and risks were discussed with the patient.                            All questions were answered, and informed consent                            was obtained. Prior Anticoagulants: The patient has                            taken no previous anticoagulant or antiplatelet                            agents. ASA Grade Assessment: II - A patient with                            mild systemic disease. After reviewing the risks                            and benefits, the patient was deemed in  satisfactory condition to undergo the procedure.                           After obtaining informed consent, the colonoscope                            was passed under direct vision. Throughout the                            procedure, the patient's blood pressure, pulse, and                            oxygen saturations were monitored continuously. The                            Olympus [id number]-[id number]) Colonoscope was                             introduced through the anus and advanced to the the                            terminal ileum, with identification of the                            appendiceal orifice and IC valve. The colonoscopy                            was performed without difficulty. The patient                            tolerated the procedure well. The quality of the                            bowel preparation was good. The ileocecal valve,                            appendiceal orifice, and rectum were photographed. Scope In: 8:41:37 AM Scope Out: 9:06:49 AM Scope Withdrawal Time: 0 hours 16 minutes 41 seconds  Total Procedure Duration: 0 hours 25 minutes 12 seconds  Findings:                 The perianal and digital rectal examinations were                            normal.                           Two sessile polyps were found in the ascending                            colon and cecum. The polyps were 4 to 11 mm in                            size. These polyps were removed with a cold snare.  Resection and retrieval were complete.                           A 20 mm polyp was found in the hepatic flexure. The                            polyp was sessile. The polyp was removed with a                            piecemeal technique using a cold snare. Resection                            and retrieval were complete.                           Patchy mild inflammation characterized by altered                            vascularity, congestion (edema), erythema,                            friability, granularity, mucus and scarring was                            found in the entire colon. Biopsies were taken with                            a cold forceps for histology.                           Non-bleeding external and internal hemorrhoids were                            found during retroflexion. The hemorrhoids were                             medium-sized. Complications:            No immediate complications. Estimated Blood Loss:     Estimated blood loss was minimal. Impression:               - Two 4 to 11 mm polyps in the ascending colon and                            in the cecum, removed with a cold snare. Resected                            and retrieved.                           - One 20 mm polyp at the hepatic flexure, removed                            piecemeal using a cold snare. Resected and  retrieved.                           - Patchy mild inflammation was found in the entire                            examined colon secondary to colitis. Biopsied.                           - Non-bleeding external and internal hemorrhoids. Recommendation:           - Patient has a contact number available for                            emergencies. The signs and symptoms of potential                            delayed complications were discussed with the                            patient. Return to normal activities tomorrow.                            Written discharge instructions were provided to the                            patient.                           - Resume previous diet.                           - Continue present medications.                           - No aspirin, ibuprofen, naproxen, or other                            non-steroidal anti-inflammatory drugs for 2 weeks.                           - Repeat colonoscopy in 1 year for surveillance                            after piecemeal polypectomy. [name], MD 09/20/2020 9:15:59 AM This report has been signed electronically.

## 2020-09-22 ENCOUNTER — Telehealth: Payer: Self-pay

## 2020-09-22 NOTE — Telephone Encounter (Signed)
First post procedure follow up call, no answer 

## 2020-10-07 ENCOUNTER — Ambulatory Visit: Payer: BC Managed Care – PPO | Admitting: Obstetrics and Gynecology

## 2020-10-07 NOTE — Progress Notes (Deleted)
60 y.o. G3P0020 Divorced Caucasian female here for annual exam.    PCP:     Patient's last menstrual period was 01/31/2007.           Sexually active: {yes no:314532}  The current method of family planning is post menopausal status.    Exercising: {yes no:314532}  {types:19826} Smoker:  no  Health Maintenance: Pap:  *** History of abnormal Pap:  {YES NO:22349} MMG:  *** Colonoscopy:  *** BMD:   ***  Result  *** TDaP:  *** Gardasil:   {YES NO:22349} HIV: Hep C: Screening Labs:  Hb today: ***, Urine today: ***   reports that she has never smoked. She has never used smokeless tobacco. She reports current alcohol use of about 2.0 standard drinks per week. She reports that she does not use drugs.  Past Medical History:  Diagnosis Date   Allergy    Anxiety    Arthritis    knees and spine, shoulder   Asthma    Bronchitis    hx - recurrent   Complication of anesthesia    waking up is not easy   Depression    Elevated IgE level 09/12/2017   09/12/2017 IgE 195   H/O miscarriage, not currently pregnant    Hx of irritable bowel syndrome    x2   Hyperlipidemia    diet controlled - no medication   Hypertension    not taking any meds at present - under control per patient   Hypokalemia    with PNA admission (2.5)   Migraines    Pneumonia    4 episodes; hosp. admission 2014   PONV (postoperative nausea and vomiting)    Thyroid disease    UC (ulcerative colitis) [facility])    DX'D 2021    Past Surgical History:  Procedure Laterality Date   BREAST SURGERY     implants, then had them removed   COLONOSCOPY     greater 10 yrs ago - ? [facility]-2017 LAST   DILATION AND CURETTAGE OF UTERUS     LAPAROSCOPIC ABDOMINAL EXPLORATION  01/31/1992   endometriosis   ORIF HUMERUS FRACTURE Left 04/01/2013   DR Ninfa Linden - shoulder   ORIF HUMERUS FRACTURE Left 04/01/2013   Procedure: OPEN REDUCTION INTERNAL FIXATION (ORIF) LEFT PROXIMAL HUMERUS FRACTURE;  Surgeon: Mcarthur Rossetti, MD;  Location: [place] [facility];  Service: Orthopedics;  Laterality: Left;   TONSILLECTOMY AND ADENOIDECTOMY      Current Outpatient Medications  Medication Sig Dispense Refill   albuterol (VENTOLIN HFA) 108 (90 Base) MCG/ACT inhaler Inhale 2 puffs into the lungs every 6 (six) hours as needed for wheezing or shortness of breath. (Patient not taking: No sig reported) 6.7 g 5   Ascorbic Acid (VITAMIN C) 1000 MG tablet Take 1,000 mg by mouth daily.     atorvastatin (LIPITOR) 40 MG tablet Take 1 tablet (40 mg total) by mouth daily. (Patient not taking: No sig reported) 90 tablet 3   B Complex Vitamins (B COMPLEX PO) Take by mouth.     Budeson-Glycopyrrol-Formoterol (BREZTRI AEROSPHERE) 160-9-4.8 MCG/ACT AERO Inhale 2 puffs into the lungs in the morning and at bedtime. 5.9 g 1   clonazePAM (KLONOPIN) 0.5 MG tablet Take 1 tablet (0.5 mg total) by mouth 3 (three) times daily as needed for anxiety. 90 tablet 5   levothyroxine (SYNTHROID) 112 MCG tablet Take 1 tablet (112 mcg total) by mouth daily. 90 tablet 3   montelukast (SINGULAIR) 10 MG tablet Take 1 tablet (10 mg  total) by mouth at bedtime. (Patient not taking: Reported on 09/20/2020) 30 tablet 11   mupirocin ointment (BACTROBAN) 2 % Apply 1 application topically 3 (three) times daily. (Patient not taking: Reported on 09/20/2020) 15 g 0   PARoxetine (PAXIL) 40 MG tablet Take 1 tablet (40 mg total) by mouth daily. 90 tablet 1   rizatriptan (MAXALT) 5 MG tablet Take 1 tablet (5 mg total) by mouth as needed for migraine. May repeat in 2 hours if needed 10 tablet 5   Spacer/Aero-Holding [facility] Use daily with inhaler 1 each 0   VITAMIN D PO Take 2,000 Units by mouth daily.     Current Facility-Administered Medications  Medication Dose Route Frequency Provider Last Rate Last Admin   0.9 %  sodium chloride infusion  500 mL Intravenous Once Nandigam, Venia Minks, MD        Family History  Problem Relation Age of Onset   Breast cancer Mother     Diabetes Mother    Heart disease Mother    Diabetes Father    Dementia Father    Colon cancer Father    Stroke Paternal Grandmother    Diabetes Paternal Grandmother    Colon polyps Neg Hx    Esophageal cancer Neg Hx    Stomach cancer Neg Hx    Rectal cancer Neg Hx     Review of Systems  Exam:   LMP 01/31/2007     General appearance: alert, cooperative and appears stated age Head: normocephalic, without obvious abnormality, atraumatic Neck: no adenopathy, supple, symmetrical, trachea midline and thyroid normal to inspection and palpation Lungs: clear to auscultation bilaterally Breasts: normal appearance, no masses or tenderness, No nipple retraction or dimpling, No nipple discharge or bleeding, No axillary adenopathy Heart: regular rate and rhythm Abdomen: soft, non-tender; no masses, no organomegaly Extremities: extremities normal, atraumatic, no cyanosis or edema Skin: skin color, texture, turgor normal. No rashes or lesions Lymph nodes: cervical, supraclavicular, and axillary nodes normal. Neurologic: grossly normal  Pelvic: External genitalia:  no lesions              No abnormal inguinal nodes palpated.              Urethra:  normal appearing urethra with no masses, tenderness or lesions              Bartholins and Skenes: normal                 Vagina: normal appearing vagina with normal color and discharge, no lesions              Cervix: no lesions              Pap taken: {yes no:314532} Bimanual Exam:  Uterus:  normal size, contour, position, consistency, mobility, non-tender              Adnexa: no mass, fullness, tenderness              Rectal exam: {yes no:314532}.  Confirms.              Anus:  normal sphincter tone, no lesions  Chaperone was present for exam:  ***  Assessment:   Well woman visit with gynecologic exam.   Plan: Mammogram screening discussed. Self breast awareness reviewed. Pap and HR HPV as above. Guidelines for Calcium, Vitamin D,  regular exercise program including cardiovascular and weight bearing exercise.   Follow up annually and prn.   Additional counseling given.  {yes  [id number]. _______ minutes face to face time of which over 50% was spent in counseling.    After visit summary provided.

## 2020-10-09 ENCOUNTER — Encounter: Payer: Self-pay | Admitting: Gastroenterology

## 2020-11-12 ENCOUNTER — Other Ambulatory Visit: Payer: Self-pay | Admitting: Physician Assistant

## 2020-11-15 NOTE — Telephone Encounter (Signed)
Please get pt scheduled with Helene Kelp for f/up, she was due in June

## 2020-11-16 ENCOUNTER — Emergency Department (HOSPITAL_BASED_OUTPATIENT_CLINIC_OR_DEPARTMENT_OTHER): Payer: BC Managed Care – PPO

## 2020-11-16 ENCOUNTER — Other Ambulatory Visit: Payer: Self-pay

## 2020-11-16 ENCOUNTER — Inpatient Hospital Stay (HOSPITAL_BASED_OUTPATIENT_CLINIC_OR_DEPARTMENT_OTHER)
Admission: EM | Admit: 2020-11-16 | Discharge: 2020-11-22 | DRG: 026 | Disposition: A | Discharge status: Home / Self Care | Payer: BC Managed Care – PPO | Attending: Neurosurgery | Admitting: Neurosurgery

## 2020-11-16 ENCOUNTER — Encounter (HOSPITAL_BASED_OUTPATIENT_CLINIC_OR_DEPARTMENT_OTHER): Payer: Self-pay | Admitting: *Deleted

## 2020-11-16 DIAGNOSIS — R55 Syncope and collapse: Secondary | ICD-10-CM

## 2020-11-16 DIAGNOSIS — Z833 Family history of diabetes mellitus: Secondary | ICD-10-CM

## 2020-11-16 DIAGNOSIS — I725 Aneurysm of other precerebral arteries: Secondary | ICD-10-CM | POA: Diagnosis present

## 2020-11-16 DIAGNOSIS — H539 Unspecified visual disturbance: Secondary | ICD-10-CM

## 2020-11-16 DIAGNOSIS — Z8669 Personal history of other diseases of the nervous system and sense organs: Secondary | ICD-10-CM

## 2020-11-16 DIAGNOSIS — I671 Cerebral aneurysm, nonruptured: Principal | ICD-10-CM | POA: Diagnosis present

## 2020-11-16 DIAGNOSIS — Z800 Family history of malignant neoplasm of digestive organs: Secondary | ICD-10-CM

## 2020-11-16 DIAGNOSIS — I10 Essential (primary) hypertension: Secondary | ICD-10-CM | POA: Diagnosis present

## 2020-11-16 DIAGNOSIS — N390 Urinary tract infection, site not specified: Secondary | ICD-10-CM | POA: Diagnosis present

## 2020-11-16 DIAGNOSIS — Z803 Family history of malignant neoplasm of breast: Secondary | ICD-10-CM

## 2020-11-16 DIAGNOSIS — Z8249 Family history of ischemic heart disease and other diseases of the circulatory system: Secondary | ICD-10-CM

## 2020-11-16 DIAGNOSIS — Z888 Allergy status to other drugs, medicaments and biological substances status: Secondary | ICD-10-CM

## 2020-11-16 DIAGNOSIS — J45909 Unspecified asthma, uncomplicated: Secondary | ICD-10-CM | POA: Diagnosis present

## 2020-11-16 DIAGNOSIS — E039 Hypothyroidism, unspecified: Secondary | ICD-10-CM | POA: Diagnosis present

## 2020-11-16 DIAGNOSIS — I729 Aneurysm of unspecified site: Secondary | ICD-10-CM | POA: Diagnosis present

## 2020-11-16 DIAGNOSIS — Z880 Allergy status to penicillin: Secondary | ICD-10-CM

## 2020-11-16 DIAGNOSIS — Z20822 Contact with and (suspected) exposure to covid-19: Secondary | ICD-10-CM | POA: Diagnosis present

## 2020-11-16 DIAGNOSIS — Z823 Family history of stroke: Secondary | ICD-10-CM

## 2020-11-16 DIAGNOSIS — N3000 Acute cystitis without hematuria: Secondary | ICD-10-CM

## 2020-11-16 DIAGNOSIS — F418 Other specified anxiety disorders: Secondary | ICD-10-CM | POA: Diagnosis present

## 2020-11-16 LAB — COMPREHENSIVE METABOLIC PANEL
ALT: 19 U/L (ref 0–44)
AST: 19 U/L (ref 15–41)
Albumin: 3.9 g/dL (ref 3.5–5.0)
Alkaline Phosphatase: 57 U/L (ref 38–126)
Anion gap: 7 (ref 5–15)
BUN: 16 mg/dL (ref 6–20)
CO2: 26 mmol/L (ref 22–32)
Calcium: 9.2 mg/dL (ref 8.9–10.3)
Chloride: 105 mmol/L (ref 98–111)
Creatinine, Ser: 0.84 mg/dL (ref 0.44–1.00)
GFR, Estimated: >60 mL/min (ref >60)
Glucose, Bld: 103 mg/dL — ABNORMAL HIGH (ref 70–99)
Potassium: 3.3 mmol/L — ABNORMAL LOW (ref 3.5–5.1)
Sodium: 138 mmol/L (ref 135–145)
Total Bilirubin: 0.3 mg/dL (ref 0.3–1.2)
Total Protein: 7 g/dL (ref 6.5–8.1)

## 2020-11-16 LAB — URINALYSIS, ROUTINE W REFLEX MICROSCOPIC
Bilirubin Urine: NEGATIVE
Glucose, UA: NEGATIVE mg/dL
Ketones, ur: NEGATIVE mg/dL
Nitrite: NEGATIVE
Protein, ur: NEGATIVE mg/dL
Specific Gravity, Urine: 1.025 (ref 1.005–1.030)
pH: 6.5 (ref 5.0–8.0)

## 2020-11-16 LAB — CBC WITH DIFFERENTIAL/PLATELET
Abs Immature Granulocytes: 0.02 10*3/uL (ref 0.00–0.07)
Basophils Absolute: 0.1 10*3/uL (ref 0.0–0.1)
Basophils Relative: 1 %
Eosinophils Absolute: 0.2 10*3/uL (ref 0.0–0.5)
Eosinophils Relative: 2 %
HCT: 39.5 % (ref 36.0–46.0)
Hemoglobin: 13.4 g/dL (ref 12.0–15.0)
Immature Granulocytes: 0 %
Lymphocytes Relative: 32 %
Lymphs Abs: 2.4 10*3/uL (ref 0.7–4.0)
MCH: 30.5 pg (ref 26.0–34.0)
MCHC: 33.9 g/dL (ref 30.0–36.0)
MCV: 89.8 fL (ref 80.0–100.0)
Monocytes Absolute: 0.6 10*3/uL (ref 0.1–1.0)
Monocytes Relative: 9 %
Neutro Abs: 4.2 10*3/uL (ref 1.7–7.7)
Neutrophils Relative %: 56 %
Platelets: 215 10*3/uL (ref 150–400)
RBC: 4.4 MIL/uL (ref 3.87–5.11)
RDW: 12.6 % (ref 11.5–15.5)
WBC: 7.5 10*3/uL (ref 4.0–10.5)
nRBC: 0 % (ref 0.0–0.2)

## 2020-11-16 LAB — URINALYSIS, MICROSCOPIC (REFLEX)

## 2020-11-16 LAB — CBG MONITORING, ED: Glucose-Capillary: 103 mg/dL — ABNORMAL HIGH (ref 70–99)

## 2020-11-16 MED ORDER — IOHEXOL 350 MG/ML SOLN
75.0000 mL | Freq: Once | INTRAVENOUS | Status: AC | PRN
  Administered 2020-11-16: 75 mL via INTRAVENOUS

## 2020-11-16 NOTE — ED Notes (Signed)
Pt. Reports she had an episode that she felt she was having blurred vision and double vision this morning around 10:30 and it quickly went away after a few seconds just after she felt she had an episode of confusion.    Pt. States she had the very same episode around 19:30 this evening.    Pt. Then stated she poss. Has a UTI with frequency and burning a little.

## 2020-11-16 NOTE — ED Triage Notes (Signed)
Double vision that changed to blurred vision that resolved in a minute. She had the same experience 45 minutes ago.

## 2020-11-16 NOTE — ED Notes (Signed)
Checked CBG 103, RN Misty informed

## 2020-11-16 NOTE — ED Provider Notes (Signed)
[facility] Provider Note   CSN: [id number] Arrival date & time: 11/16/20  2038     History Chief Complaint  Patient presents with   Diplopia    Tiffany Horn is a 60 y.o. female.  The history is provided by the patient and medical records. No language interpreter was used.  Neurologic Problem This is a recurrent problem. The current episode started 1 to 2 hours ago. The problem occurs rarely. The problem has been resolved. Pertinent negatives include no chest pain, no abdominal pain, no headaches and no shortness of breath. Nothing aggravates the symptoms. Nothing relieves the symptoms. She has tried nothing for the symptoms. The treatment provided no relief.      Past Medical History:  Diagnosis Date   Allergy    Anxiety    Arthritis    knees and spine, shoulder   Asthma    Bronchitis    hx - recurrent   Complication of anesthesia    waking up is not easy   Depression    Elevated IgE level 09/12/2017   09/12/2017 IgE 195   H/O miscarriage, not currently pregnant    Hx of irritable bowel syndrome    x2   Hyperlipidemia    diet controlled - no medication   Hypertension    not taking any meds at present - under control per patient   Hypokalemia    with PNA admission (2.5)   Migraines    Pneumonia    4 episodes; hosp. 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 [facility]-2017 LAST   DILATION AND CURETTAGE OF UTERUS     LAPAROSCOPIC ABDOMINAL EXPLORATION  01/31/1992   endometriosis   ORIF HUMERUS FRACTURE Left 04/01/2013   DR Ninfa Linden - shoulder   ORIF HUMERUS FRACTURE Left 04/01/2013   Procedure: OPEN REDUCTION INTERNAL FIXATION (ORIF) LEFT PROXIMAL HUMERUS FRACTURE;  Surgeon: Mcarthur Rossetti, MD;  Location: [facility];  Service: Orthopedics;  Laterality: Left;   TONSILLECTOMY AND ADENOIDECTOMY       OB History     Gravida  2   Para  0   Term  0   Preterm  0   AB  2   Living  0      SAB  2   IAB      Ectopic      Multiple      Live Births              Family History  Problem Relation Age of Onset   Breast cancer Mother  Diabetes Mother    Heart disease Mother    Diabetes Father    Dementia Father    Colon cancer Father    Stroke Paternal Grandmother    Diabetes Paternal Grandmother    Colon polyps Neg Hx    Esophageal cancer Neg Hx    Stomach cancer Neg Hx    Rectal cancer Neg Hx     Social History   Tobacco Use   Smoking status: Never   Smokeless tobacco: Never  Vaping Use   Vaping Use: Never used  Substance Use Topics   Alcohol use: Yes    Alcohol/week: 2.0 standard drinks    Types: 2 Glasses of wine per week    Comment: OCC / SOCIAL  MAYBE 2 A WEEK   Drug use: No    Home Medications Prior to Admission medications   Medication Sig Start Date End Date Taking? Authorizing Provider  albuterol (VENTOLIN HFA) 108 (90 Base) MCG/ACT inhaler Inhale 2 puffs into the lungs every 6 (six) hours as needed for wheezing or shortness of breath. Patient not taking: No sig reported 05/21/19   Kuneff, Renee A, DO   Ascorbic Acid (VITAMIN C) 1000 MG tablet Take 1,000 mg by mouth daily.    [provider]  atorvastatin (LIPITOR) 40 MG tablet Take 1 tablet (40 mg total) by mouth daily. Patient not taking: No sig reported 03/02/20   Jerline Pain, MD  B Complex Vitamins (B COMPLEX PO) Take by mouth.    [provider]  Budeson-Glycopyrrol-Formoterol (BREZTRI AEROSPHERE) 160-9-4.8 MCG/ACT AERO Inhale 2 puffs into the lungs in the morning and at bedtime. 04/28/20   Icard, Octavio Graves, DO  clonazePAM (KLONOPIN) 0.5 MG tablet Take 1 tablet (0.5 mg total) by mouth 3 (three) times daily as needed for anxiety. 04/23/20   Addison Lank, PA-C  levothyroxine (SYNTHROID) 112 MCG tablet Take 1 tablet (112 mcg total) by mouth daily. 05/22/19   Kuneff, Renee A, DO  montelukast (SINGULAIR) 10 MG tablet Take 1 tablet (10 mg total) by mouth at bedtime. Patient not taking: Reported on 09/20/2020 07/31/19   Martyn Ehrich, NP  mupirocin ointment (BACTROBAN) 2 % Apply 1 application topically 3 (three) times daily. Patient not taking: Reported on 09/20/2020 09/09/20   Tammi Sou, MD  PARoxetine (PAXIL) 40 MG tablet Take 1 tablet (40 mg total) by mouth daily. 04/23/20   Donnal Moat T, PA-C  rizatriptan (MAXALT) 5 MG tablet Take 1 tablet (5 mg total) by mouth as needed for migraine. May repeat in 2 hours if needed 09/03/19   Howard Pouch A, DO  Spacer/Aero-Holding [facility] Use daily with inhaler 04/28/20   Icard, Leory Plowman L, DO  VITAMIN D PO Take 2,000 Units by mouth daily.    [provider]    Allergies    Abilify [aripiprazole], Amoxicillin, and Seroquel [quetiapine fumarate]  Review of Systems   Review of Systems  Constitutional:  Negative for chills, diaphoresis, fatigue and fever.  HENT:  Negative for congestion.   Eyes:  Positive for visual disturbance.  Respiratory:  Negative for cough, chest tightness, shortness of breath and wheezing.   Cardiovascular:  Negative for chest pain.   Gastrointestinal:  Positive for nausea. Negative for abdominal pain, constipation, diarrhea and vomiting.  Genitourinary:  Positive for dysuria. Negative for flank pain.  Musculoskeletal:  Negative for back pain, neck pain and neck stiffness.  Skin:  Negative for rash and wound.  Neurological:  Positive for tremors, syncope and  light-headedness. Negative for facial asymmetry, speech difficulty, weakness and headaches.  Psychiatric/Behavioral:  Negative for agitation.   All other systems reviewed and are negative.  Physical Exam Updated Vital Signs BP (!) 136/97   Pulse 76   Temp 98.3 F (36.8 C) (Oral)   Resp 13   Ht 5\' 7"  (1.702 m)   Wt 79.4 kg   LMP 01/31/2007   SpO2 97%   BMI 27.42 kg/m   Physical Exam Vitals and nursing note reviewed.  Constitutional:      General: She is not in acute distress.    Appearance: She is well-developed. She is not ill-appearing, toxic-appearing or diaphoretic.  HENT:     Head: Normocephalic and atraumatic.     Nose: No congestion or rhinorrhea.     Mouth/Throat:     Mouth: Mucous membranes are moist.  Eyes:     Extraocular Movements: Extraocular movements intact.     Conjunctiva/sclera: Conjunctivae normal.     Pupils: Pupils are equal, round, and reactive to light.  Neck:     Vascular: No carotid bruit.  Cardiovascular:     Rate and Rhythm: Normal rate and regular rhythm.     Heart sounds: No murmur heard. Pulmonary:     Effort: Pulmonary effort is normal. No respiratory distress.     Breath sounds: Normal breath sounds. No wheezing, rhonchi or rales.  Chest:     Chest wall: No tenderness.  Abdominal:     General: Abdomen is flat.     Palpations: Abdomen is soft.     Tenderness: There is no abdominal tenderness. There is no guarding or rebound.  Musculoskeletal:        General: No tenderness.     Cervical back: Neck supple. No tenderness.  Skin:    General: Skin is warm and dry.     Capillary Refill: Capillary refill takes  less than 2 seconds.     Findings: No erythema.  Neurological:     General: No focal deficit present.     Mental Status: She is alert and oriented to person, place, and time. Mental status is at baseline.     Cranial Nerves: No cranial nerve deficit.     Sensory: No sensory deficit.     Motor: No weakness.     Coordination: Coordination normal.  Psychiatric:        Mood and Affect: Mood normal.    ED Results / Procedures / Treatments   Labs (all labs ordered are listed, but only abnormal results are displayed) Labs Reviewed  URINALYSIS, ROUTINE W REFLEX MICROSCOPIC - Abnormal; Notable for the following components:      Result Value   Hgb urine dipstick TRACE (*)    Leukocytes,Ua SMALL (*)    All other components within normal limits  COMPREHENSIVE METABOLIC PANEL - Abnormal; Notable for the following components:   Potassium 3.3 (*)    Glucose, Bld 103 (*)    All other components within normal limits  URINALYSIS, MICROSCOPIC (REFLEX) - Abnormal; Notable for the following components:   Bacteria, UA FEW (*)    Non Squamous Epithelial PRESENT (*)    All other components within normal limits  CBG MONITORING, ED - Abnormal; Notable for the following components:   Glucose-Capillary 103 (*)    All other components within normal limits  RESP PANEL BY RT-PCR (FLU A&B, COVID) ARPGX2  URINE CULTURE  CBC WITH DIFFERENTIAL/PLATELET  TSH    EKG None  Radiology CT ANGIO HEAD NECK W  WO CM  Result Date: 11/17/2020 CLINICAL DATA:  Diplopia, concern for TIA versus seizure versus stroke EXAM: CT ANGIOGRAPHY HEAD AND NECK TECHNIQUE: Multidetector CT imaging of the head and neck was performed using the standard protocol during bolus administration of intravenous contrast. Multiplanar CT image reconstructions and MIPs were obtained to evaluate the vascular anatomy. Carotid stenosis measurements (when applicable) are obtained utilizing NASCET criteria, using the distal internal carotid diameter  as the denominator. CONTRAST:  65mL OMNIPAQUE IOHEXOL 350 MG/ML SOLN COMPARISON:  no prior CTA, correlation is made with CT head 08/22/2010. FINDINGS: CT HEAD FINDINGS Brain: No evidence of acute infarction, hemorrhage, cerebral edema, mass, mass effect, or midline shift. Ventricles and sulci are within normal limits for age. No extra-axial fluid collection. Vascular: No hyperdense vessel or unexpected calcification. Skull: Normal. Negative for fracture or focal lesion. Sinuses/Orbits: No acute finding. Other: The mastoid air cells are well aerated. 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 Imaged portion shows no evidence of aneurysm or dissection. No significant stenosis. Right carotid system: No evidence of dissection, stenosis (50% or greater) or occlusion. Left carotid system: No evidence of dissection, stenosis (50% or greater) or occlusion. Vertebral arteries: Codominant. No evidence of dissection, stenosis (50% or greater) or occlusion. Skeleton: Degenerative changes in the cervical spine, with straightening and mild reversal of the normal cervical lordosis. No acute osseous abnormality. Other neck: Negative. Upper chest: Negative. Review of the MIP images confirms the above findings CTA HEAD FINDINGS Anterior circulation: Both internal carotid arteries are patent to the termini, without stenosis or other abnormality. A1 segments patent, with mild narrowing versus hypoplasia of the right A1. Normal anterior communicating artery. Anterior cerebral arteries are patent to their distal aspects. No M1 stenosis or occlusion. Normal MCA bifurcations. Distal MCA branches perfused and symmetric. Posterior circulation: 8 x 6 x 7 mm basilar tip aneurysm, with wide neck measuring 4 mm (series 15, images 119 and 120 and series 11, image 238). The aneurysm is directed superiorly and is at the origin of the right PCA. The  right PCA is otherwise well perfused. Normal left PCA. The origins of the superior cerebellar arteries are patent. The posterior communicating arteries are not visualized. Vertebral arteries widely patent to the vertebrobasilar junction without stenosis. Posterior inferior cerebral arteries patent bilaterally. Basilar patent to its distal aspect. Venous sinuses: As permitted by contrast timing, patent. Anatomic variants: None significant Review of the MIP images confirms the above findings IMPRESSION: 1. 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 2. No intracranial large vessel occlusion. 3. No hemodynamically significant stenosis in the neck. These results were called by telephone at the time of interpretation on 11/17/2020 at 12:02 am to provider [facility] , who verbally acknowledged these results. Electronically Signed   By: Merilyn Baba M.D.   On: 11/17/2020 00:04    Procedures Procedures   CRITICAL CARE Performed by: Gwenyth Allegra Tarig Zimmers Total critical care time: 45 minutes Critical care time was exclusive of separately billable procedures and treating other patients. Critical care was necessary to treat or prevent imminent or life-threatening deterioration. Critical care was time spent personally by me on the following activities: development of treatment plan with patient and/or surrogate as well as nursing, discussions with consultants, evaluation of patient's response to treatment, examination of patient, obtaining history from patient or surrogate, ordering and performing  treatments and interventions, ordering and review of laboratory studies, ordering and review of radiographic studies, pulse oximetry and re-evaluation of patient's condition.   Medications Ordered in ED Medications  cefTRIAXone (ROCEPHIN) 1 g in sodium chloride 0.9 % 100 mL IVPB (has no administration in time range)  iohexol (OMNIPAQUE) 350 MG/ML injection 75  mL (75 mLs Intravenous Contrast Given 11/16/20 2332)     ED Course  I have reviewed the triage vital signs and the nursing notes.  Pertinent labs & imaging results that were available during my care of the patient were reviewed by me and considered in my medical decision making (see chart for details).    MDM Rules/Calculators/A&P                           Tiffany Horn is a 60 y.o. female with a past medical history significant for hypothyroidism, hyperlipidemia, migraines, asthma, ulcerative colitis, and strong family history of strokes who presents with transient neurologic complaint.  According to patient, this morning, she had an episode of diplopia followed by blurry vision and feeling dazed for several minutes.  She reports she did not lose control her bowel or bladder and did not have a fall.  She says this resolved and then she went about her day.  She reports this evening while laying in bed, she had another episode of diplopia with horizontal diplopia with some associated right arm shaking and zoning out again.  She think she lost consciousness for several minutes.  She denies any headache or neck pain.  She reports she is feeling at her baseline now.  She reports she is concerned because several family numbers have had strokes in their 69s and many of them had TIAs before the big stroke.  She reports no other preceding symptoms of fevers, chills, ingestion, cough, nausea, vomiting, constipation, or diarrhea.  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  She said she had some nausea during the episode.  On exam, lungs are clear and chest is nontender.  Abdomen is nontender.  Patient had normal strength and sensation in extremities.  Normal finger-nose-finger testing bilaterally.  Symmetric smile.  Pupils are symmetric and reactive normal extract movements.  No carotid bruit appreciated.  Patient otherwise well-appearing.  Clinically I am concerned  about TIA or some other vertebrobasilar insufficiency leading to the arm shaking, vision changes intermittently, and the syncope.  I spoke to neurology who agreed with getting CTA head and neck followed by likely admission for MRI.  12:08 AM Was just called by radiology the patient has a large 8 mm aneurysm, 4 mm at the base that is likely causing the symptoms.  We will touch base with neurology to discuss who needs to be consulted as well.  Patient will need admission.  I informed patient of the discovery.  I spoke with Dr. Theda Sers who initially recommended speaking with Dr. Kathyrn Sheriff who was on the subarachnoid call however Dr. Kathyrn Sheriff recommends I speak with the on-call neurosurgeon Dr. Trenton Gammon to discuss if she needs ED to ED transfer and evaluation for admission.   Also, patient was reporting that she had some dysuria and wanted to know the results of the urine.  The urine does show some leukocytes and bacteria, will treat for UTI.  Spoke to pharmacy who confirmed that she can get Rocephin and this was ordered.   12:54 AM Spoke to Dr. Trenton Gammon who  request that she get admitted to his service for further management.  He felt that this is appropriate for admission tonight but did not need ED to ED transfer given her blood pressure stability and being symptom-free at this time.  If symptoms were to worsen or deteriorate, anticipate ED to ED transfer.  They are aware of the UTI as well.  Patient will be admitted.   Final Clinical Impression(s) / ED Diagnoses Final diagnoses:  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HCC)  Transient vision disturbance  Hx of diplopia  Syncope, unspecified syncope type  Acute cystitis without hematuria      Clinical Impression: 1. 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HCC)   2. Transient vision disturbance   3. Hx of diplopia   4. Syncope, unspecified syncope type   5. Acute cystitis without hematuria     Disposition: Admit  This note was prepared with assistance of  Dragon voice recognition software. Occasional wrong-word or sound-a-like substitutions may have occurred due to the inherent limitations of voice recognition software.     Janye Maynor, Gwenyth Allegra, MD 11/17/20 [phone]

## 2020-11-17 DIAGNOSIS — Z20822 Contact with and (suspected) exposure to covid-19: Secondary | ICD-10-CM | POA: Diagnosis present

## 2020-11-17 DIAGNOSIS — N390 Urinary tract infection, site not specified: Secondary | ICD-10-CM | POA: Diagnosis present

## 2020-11-17 DIAGNOSIS — F418 Other specified anxiety disorders: Secondary | ICD-10-CM | POA: Diagnosis present

## 2020-11-17 DIAGNOSIS — E039 Hypothyroidism, unspecified: Secondary | ICD-10-CM | POA: Diagnosis present

## 2020-11-17 DIAGNOSIS — Z823 Family history of stroke: Secondary | ICD-10-CM | POA: Diagnosis not present

## 2020-11-17 DIAGNOSIS — I725 Aneurysm of other precerebral arteries: Secondary | ICD-10-CM | POA: Diagnosis present

## 2020-11-17 DIAGNOSIS — I10 Essential (primary) hypertension: Secondary | ICD-10-CM | POA: Diagnosis present

## 2020-11-17 DIAGNOSIS — I729 Aneurysm of unspecified site: Secondary | ICD-10-CM | POA: Diagnosis present

## 2020-11-17 DIAGNOSIS — Z833 Family history of diabetes mellitus: Secondary | ICD-10-CM | POA: Diagnosis not present

## 2020-11-17 DIAGNOSIS — Z803 Family history of malignant neoplasm of breast: Secondary | ICD-10-CM | POA: Diagnosis not present

## 2020-11-17 DIAGNOSIS — Z8249 Family history of ischemic heart disease and other diseases of the circulatory system: Secondary | ICD-10-CM | POA: Diagnosis not present

## 2020-11-17 DIAGNOSIS — Z888 Allergy status to other drugs, medicaments and biological substances status: Secondary | ICD-10-CM | POA: Diagnosis not present

## 2020-11-17 DIAGNOSIS — Z880 Allergy status to penicillin: Secondary | ICD-10-CM | POA: Diagnosis not present

## 2020-11-17 DIAGNOSIS — Z800 Family history of malignant neoplasm of digestive organs: Secondary | ICD-10-CM | POA: Diagnosis not present

## 2020-11-17 DIAGNOSIS — I671 Cerebral aneurysm, nonruptured: Secondary | ICD-10-CM | POA: Diagnosis present

## 2020-11-17 DIAGNOSIS — J45909 Unspecified asthma, uncomplicated: Secondary | ICD-10-CM | POA: Diagnosis present

## 2020-11-17 LAB — RESP PANEL BY RT-PCR (FLU A&B, COVID) ARPGX2
Influenza A by PCR: NEGATIVE
Influenza B by PCR: NEGATIVE
SARS Coronavirus 2 by RT PCR: NEGATIVE

## 2020-11-17 LAB — TSH: TSH: 10.657 u[IU]/mL — ABNORMAL HIGH (ref 0.350–4.500)

## 2020-11-17 MED ORDER — HYDROCODONE-ACETAMINOPHEN 5-325 MG PO TABS
1.0000 | ORAL_TABLET | ORAL | Status: DC | PRN
  Administered 2020-11-18 – 2020-11-20 (×4): 1 via ORAL
  Administered 2020-11-20: 2 via ORAL
  Administered 2020-11-20 – 2020-11-21 (×4): 1 via ORAL
  Administered 2020-11-22: 2 via ORAL
  Filled 2020-11-17 (×4): qty 1
  Filled 2020-11-17 (×2): qty 2
  Filled 2020-11-17 (×4): qty 1

## 2020-11-17 MED ORDER — ACETAMINOPHEN 325 MG PO TABS
650.0000 mg | ORAL_TABLET | Freq: Four times a day (QID) | ORAL | Status: DC | PRN
  Administered 2020-11-18: 650 mg via ORAL
  Filled 2020-11-17: qty 2

## 2020-11-17 MED ORDER — ACETAMINOPHEN 650 MG RE SUPP: 650.0000 mg | Freq: Four times a day (QID) | RECTAL | Status: DC | PRN

## 2020-11-17 MED ORDER — ONDANSETRON HCL 4 MG PO TABS: 4.0000 mg | ORAL_TABLET | Freq: Four times a day (QID) | ORAL | Status: DC | PRN

## 2020-11-17 MED ORDER — SODIUM CHLORIDE 0.9 % IV SOLN
1.0000 g | Freq: Once | INTRAVENOUS | Status: AC
  Administered 2020-11-17: 1 g via INTRAVENOUS
  Filled 2020-11-17: qty 10

## 2020-11-17 MED ORDER — ONDANSETRON HCL 4 MG/2ML IJ SOLN: 4.0000 mg | Freq: Four times a day (QID) | INTRAMUSCULAR | Status: DC | PRN

## 2020-11-17 MED ORDER — METOPROLOL TARTRATE 5 MG/5ML IV SOLN
5.0000 mg | Freq: Four times a day (QID) | INTRAVENOUS | Status: DC
  Administered 2020-11-19: 5 mg via INTRAVENOUS
  Filled 2020-11-17 (×2): qty 5

## 2020-11-17 NOTE — ED Notes (Signed)
[facility] at bedside 

## 2020-11-17 NOTE — ED Notes (Signed)
Pt reports she had another episode of dizziness and double vision that lasted 5 minutes. Resolved prior to staff arrival. Pt currently denies vision issues, grips equal, strong bilateral dorsiflexion and flexion of feet.

## 2020-11-17 NOTE — ED Notes (Signed)
ED Provider at bedside. 

## 2020-11-18 ENCOUNTER — Other Ambulatory Visit (HOSPITAL_COMMUNITY): Payer: Self-pay

## 2020-11-18 DIAGNOSIS — I671 Cerebral aneurysm, nonruptured: Secondary | ICD-10-CM | POA: Diagnosis present

## 2020-11-18 LAB — URINE CULTURE: Culture: 10000 — AB

## 2020-11-18 MED ORDER — CLOPIDOGREL BISULFATE 75 MG PO TABS
300.0000 mg | ORAL_TABLET | Freq: Once | ORAL | Status: AC
  Administered 2020-11-18: 300 mg via ORAL
  Filled 2020-11-18: qty 4

## 2020-11-18 MED ORDER — LEVOTHYROXINE SODIUM 112 MCG PO TABS
112.0000 ug | ORAL_TABLET | Freq: Every day | ORAL | Status: DC
  Administered 2020-11-18 – 2020-11-22 (×5): 112 ug via ORAL
  Filled 2020-11-18 (×5): qty 1

## 2020-11-18 MED ORDER — MONTELUKAST SODIUM 10 MG PO TABS: 10.0000 mg | ORAL_TABLET | Freq: Every day | ORAL | Status: DC

## 2020-11-18 MED ORDER — BUDESON-GLYCOPYRROL-FORMOTEROL 160-9-4.8 MCG/ACT IN AERO: 2.0000 | INHALATION_SPRAY | Freq: Two times a day (BID) | RESPIRATORY_TRACT | Status: DC | PRN

## 2020-11-18 MED ORDER — B COMPLEX PO CAPS: ORAL_CAPSULE | Freq: Every day | ORAL | Status: DC

## 2020-11-18 MED ORDER — MUPIROCIN 2 % EX OINT: 1.0000 "application " | TOPICAL_OINTMENT | Freq: Three times a day (TID) | CUTANEOUS | Status: DC

## 2020-11-18 MED ORDER — B COMPLEX-C PO TABS
1.0000 | ORAL_TABLET | Freq: Every day | ORAL | Status: DC
  Administered 2020-11-18 – 2020-11-22 (×5): 1 via ORAL
  Filled 2020-11-18 (×5): qty 1

## 2020-11-18 MED ORDER — ALBUTEROL SULFATE (2.5 MG/3ML) 0.083% IN NEBU: 2.5000 mg | INHALATION_SOLUTION | Freq: Four times a day (QID) | RESPIRATORY_TRACT | Status: DC | PRN

## 2020-11-18 MED ORDER — ALBUTEROL SULFATE HFA 108 (90 BASE) MCG/ACT IN AERS: 2.0000 | INHALATION_SPRAY | Freq: Four times a day (QID) | RESPIRATORY_TRACT | Status: DC | PRN

## 2020-11-18 MED ORDER — UMECLIDINIUM BROMIDE 62.5 MCG/ACT IN AEPB
1.0000 | INHALATION_SPRAY | Freq: Every day | RESPIRATORY_TRACT | Status: DC
  Administered 2020-11-18 – 2020-11-22 (×5): 1 via RESPIRATORY_TRACT
  Filled 2020-11-18: qty 7

## 2020-11-18 MED ORDER — SODIUM CHLORIDE 0.9 % IV SOLN: 500.0000 mL | Freq: Once | INTRAVENOUS | Status: DC

## 2020-11-18 MED ORDER — ATORVASTATIN CALCIUM 40 MG PO TABS: 40.0000 mg | ORAL_TABLET | Freq: Every day | ORAL | Status: DC

## 2020-11-18 MED ORDER — ASPIRIN 325 MG PO TABS
325.0000 mg | ORAL_TABLET | Freq: Every day | ORAL | Status: DC
  Administered 2020-11-18 – 2020-11-20 (×3): 325 mg via ORAL
  Filled 2020-11-18 (×3): qty 1

## 2020-11-18 MED ORDER — SUMATRIPTAN SUCCINATE 50 MG PO TABS
50.0000 mg | ORAL_TABLET | ORAL | Status: DC | PRN
  Filled 2020-11-18: qty 1

## 2020-11-18 MED ORDER — PAROXETINE HCL 20 MG PO TABS
40.0000 mg | ORAL_TABLET | Freq: Every day | ORAL | Status: DC
  Administered 2020-11-18 – 2020-11-22 (×5): 40 mg via ORAL
  Filled 2020-11-18 (×5): qty 2

## 2020-11-18 MED ORDER — MAGNESIUM GLYCINATE 665 MG PO CAPS: ORAL_CAPSULE | Freq: Every day | ORAL | Status: DC

## 2020-11-18 MED ORDER — MOMETASONE FURO-FORMOTEROL FUM 200-5 MCG/ACT IN AERO
2.0000 | INHALATION_SPRAY | Freq: Two times a day (BID) | RESPIRATORY_TRACT | Status: DC
  Administered 2020-11-18 – 2020-11-22 (×7): 2 via RESPIRATORY_TRACT
  Filled 2020-11-18: qty 8.8

## 2020-11-18 MED ORDER — SODIUM CHLORIDE 0.9 % IV SOLN
1.0000 g | INTRAVENOUS | Status: AC
  Administered 2020-11-18 – 2020-11-19 (×2): 1 g via INTRAVENOUS
  Filled 2020-11-18 (×2): qty 10

## 2020-11-18 MED ORDER — ASCORBIC ACID 500 MG PO TABS
1000.0000 mg | ORAL_TABLET | Freq: Every day | ORAL | Status: DC
  Administered 2020-11-18 – 2020-11-22 (×5): 1000 mg via ORAL
  Filled 2020-11-18 (×5): qty 2

## 2020-11-18 NOTE — Progress Notes (Signed)
At approx 1500 pt reported that the headache was gone, still had some blurry vision. And then the blurry vision subsided approx at 1515ish.  Pt not sure of exact times.  Pt calm and more relaxed with pain and blurry vision subsided.   Pt had gotten very emotional and was crying earlier. She spoke with family/friend on phone letting them know about her headache pain and vision.     No return call from Dr Kathyrn Sheriff or his office.   Pt feeling better at this time. Instructed to call if any pain or vision changes.

## 2020-11-18 NOTE — Plan of Care (Signed)

## 2020-11-18 NOTE — H&P (Signed)
Chief Complaint   Chief Complaint  Patient presents with   Diplopia    History of Present Illness  Tiffany Horn is a 60 y.o. female admitted to [facility] after presenting to the emergency department with several episodes of transient diplopia and worsening headaches.  Patient does have a history of chronic headaches, however she says over the last 10 days or so headaches have gotten significantly worse.  In fact, over the last day or 2 they have been relatively constant, "pounding" type headaches.  About 3 days ago, she was teaching some first graders and noted development of double vision.  This lasted for a few minutes and then resolved.  She had a similar episode later that night prompting her visit to the emergency department.  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  She was therefore admitted to [facility] for further evaluation and treatment.  Of note, the patient does not report any history of hypertension or diabetes.  She does report a history of "severe" asthma although she says she takes an inhaler primarily on an as-needed basis.  She does not report any history of kidney disease.  She is a non-smoker.  There is no known family history of intracranial aneurysms or unexplained intracranial hemorrhage.  Past Medical History   Past Medical History:  Diagnosis Date   Allergy    Anxiety    Arthritis    knees and spine, shoulder   Asthma    Bronchitis    hx - recurrent   Complication of anesthesia    waking up is not easy   Depression    Elevated IgE level 09/12/2017   09/12/2017 IgE 195   H/O miscarriage, not currently pregnant    Hx of irritable bowel syndrome    x2   Hyperlipidemia    diet controlled - no medication   Hypertension    not taking any meds at present - under control per patient   Hypokalemia    with PNA admission (2.5)   Migraines    Pneumonia    4 episodes; hosp. admission 2014    PONV (postoperative nausea and vomiting)    Thyroid disease    UC (ulcerative colitis) [facility])    DX'D 2021    Past Surgical History   Past Surgical History:  Procedure Laterality Date   BREAST SURGERY     implants, then had them removed   COLONOSCOPY     greater 10 yrs ago - ? [facility]-2017 LAST   DILATION AND CURETTAGE OF UTERUS     LAPAROSCOPIC ABDOMINAL EXPLORATION  01/31/1992   endometriosis   ORIF HUMERUS FRACTURE Left 04/01/2013   DR Ninfa Linden - shoulder   ORIF HUMERUS FRACTURE Left 04/01/2013   Procedure: OPEN REDUCTION INTERNAL FIXATION (ORIF) LEFT PROXIMAL HUMERUS FRACTURE;  Surgeon: Mcarthur Rossetti, MD;  Location: [place];  Service: Orthopedics;  Laterality: Left;   TONSILLECTOMY AND ADENOIDECTOMY      Social History   Social History   Tobacco Use   Smoking status: Never   Smokeless tobacco: Never  Vaping Use   Vaping Use: Never used  Substance Use Topics   Alcohol use: Yes    Alcohol/week: 2.0 standard drinks    Types: 2 Glasses of wine per week    Comment: OCC / SOCIAL  MAYBE 2 A WEEK   Drug use: No    Medications   Prior to Admission medications   Medication Sig  Start Date End Date Taking? Authorizing Provider  Ascorbic Acid (VITAMIN C) 1000 MG tablet Take 1,000 mg by mouth daily.   Yes [provider]  Budeson-Glycopyrrol-Formoterol (BREZTRI AEROSPHERE) 160-9-4.8 MCG/ACT AERO Inhale 2 puffs into the lungs in the morning and at bedtime. 04/28/20  Yes Icard, Octavio Graves, DO  clonazePAM (KLONOPIN) 0.5 MG tablet Take 1 tablet (0.5 mg total) by mouth 3 (three) times daily as needed for anxiety. Patient taking differently: Take 0.5 mg by mouth daily as needed for anxiety. 04/23/20  Yes Hurst, Dorothea Glassman, PA-C  MAGNESIUM GLYCINATE PO Take 1 tablet by mouth at bedtime.   Yes [provider]  PARoxetine (PAXIL) 40 MG tablet Take 1 tablet (40 mg total) by mouth daily. 04/23/20  Yes Donnal Moat T, PA-C  rizatriptan (MAXALT) 5 MG tablet  Take 1 tablet (5 mg total) by mouth as needed for migraine. May repeat in 2 hours if needed Patient taking differently: Take 5 mg by mouth daily as needed for migraine. 09/03/19  Yes Kuneff, Renee A, DO  albuterol (VENTOLIN HFA) 108 (90 Base) MCG/ACT inhaler Inhale 2 puffs into the lungs every 6 (six) hours as needed for wheezing or shortness of breath. Patient not taking: No sig reported 05/21/19   Kuneff, Renee A, DO  atorvastatin (LIPITOR) 40 MG tablet Take 1 tablet (40 mg total) by mouth daily. Patient not taking: Reported on 11/17/2020 03/02/20   Jerline Pain, MD  B Complex Vitamins (B COMPLEX PO) Take by mouth.    [provider]  levothyroxine (SYNTHROID) 112 MCG tablet Take 1 tablet (112 mcg total) by mouth daily. Patient not taking: Reported on 11/17/2020 05/22/19   Howard Pouch A, DO  montelukast (SINGULAIR) 10 MG tablet Take 1 tablet (10 mg total) by mouth at bedtime. Patient not taking: No sig reported 07/31/19   Martyn Ehrich, NP  mupirocin ointment (BACTROBAN) 2 % Apply 1 application topically 3 (three) times daily. Patient not taking: No sig reported 09/09/20   McGowen, Adrian Blackwater, MD  Spacer/Aero-Holding Dorise Bullion Use daily with inhaler 04/28/20   Icard, Octavio Graves, DO    Allergies   Allergies  Allergen Reactions   Abilify [Aripiprazole] Nausea And Vomiting   Amoxicillin Diarrhea   Seroquel [Quetiapine Fumarate]     Unknown    Review of Systems  ROS  Neurologic Exam  Awake, alert, oriented Memory and concentration grossly intact Speech fluent, appropriate CN grossly intact Motor exam: Upper Extremities Deltoid Bicep Tricep Grip  Right 5/5 5/5 5/5 5/5  Left 5/5 5/5 5/5 5/5   Lower Extremities IP Quad PF DF EHL  Right 5/5 5/5 5/5 5/5 5/5  Left 5/5 5/5 5/5 5/5 5/5   Sensation grossly intact to LT  Imaging  CT angiogram of the head was personally reviewed and demonstrates a superiorly and anteriorly projecting aneurysm which appears to arise at the  origin of the right P1.  Aneurysm measures 8 to 9 mm and appears to have a relatively wide neck.  Impression  - 60 y.o. female with worsening headaches and new onset of intermittent diplopia.  Suspect this is likely related to irritation of the 3rd nerve from the basilar aneurysm.  Given the new symptoms, I do think that treatment of this aneurysm on a relatively urgent basis is necessary.  I believe this aneurysm would be amenable to stent supported coil embolization.  Plan  - I will load the patient with 300 mg Plavix and 325 mg aspirin -We will  plan on diagnostic angiogram and stent supported coil embolization of the aneurysm tomorrow -N.p.o. after midnight tonight  I did review the imaging findings with the patient.  I discussed with her my recommendation for treatment given the size of the aneurysm, its posterior circulation location, as well as her new symptoms which might indicate potential for rupture.  I did review with the patient the details of the procedure as well as the expected postoperative course and recovery.  We discussed the need for antiplatelet medications.  I reviewed with her the risks of endovascular aneurysm treatment including the risk of stroke or aneurysm hemorrhage leading to weakness, paralysis, coma, death.  We also discussed risks of arterial dissection, contrast nephropathy, contrast reaction, and groin hematoma.  General risks of anesthesia including heart attack, stroke, and blood clots were also reviewed.  Patient appeared to understand our discussion and is eager to proceed with treatment of her aneurysm.  All her questions today were answered.   Consuella Lose, MD [facility]

## 2020-11-18 NOTE — Progress Notes (Signed)
At 1424 pt called out with complaints of having blurred vision and bad headache.  Pt states this is has occurred before, this is 5th headache occurrence with blurred vision since the headaches started since the first one last Friday.    Also states this headache is more sudden like a hammer hitting her head. And has blurred vision with it. Rates pain 9/10, pain is sharp right between the eyes.   Pt resting in bed. Dr Cleotilde Neer office called, relayed message to office staff [facility]), she will give information to Dr Kathyrn Sheriff and someone will call back.      Pt adds she feels like the room is spinning.

## 2020-11-19 ENCOUNTER — Encounter (HOSPITAL_COMMUNITY)
Admission: EM | Disposition: A | Discharge status: Home / Self Care | Payer: Self-pay | Source: Home / Self Care | Attending: Neurosurgery

## 2020-11-19 ENCOUNTER — Inpatient Hospital Stay (HOSPITAL_COMMUNITY): Payer: BC Managed Care – PPO

## 2020-11-19 ENCOUNTER — Inpatient Hospital Stay (HOSPITAL_COMMUNITY): Payer: BC Managed Care – PPO | Admitting: Anesthesiology

## 2020-11-19 ENCOUNTER — Encounter (HOSPITAL_COMMUNITY): Payer: Self-pay | Admitting: Neurosurgery

## 2020-11-19 HISTORY — PX: IR ANGIO INTRA EXTRACRAN SEL INTERNAL CAROTID BILAT MOD SED: IMG5363

## 2020-11-19 HISTORY — PX: IR INTRA CRAN STENT: IMG2345

## 2020-11-19 HISTORY — PX: IR ANGIOGRAM FOLLOW UP STUDY: IMG697

## 2020-11-19 HISTORY — PX: RADIOLOGY WITH ANESTHESIA: SHX6223

## 2020-11-19 HISTORY — PX: IR TRANSCATH/EMBOLIZ: IMG695

## 2020-11-19 HISTORY — PX: IR ANGIO VERTEBRAL SEL VERTEBRAL BILAT MOD SED: IMG5369

## 2020-11-19 LAB — MRSA NEXT GEN BY PCR, NASAL: MRSA by PCR Next Gen: NOT DETECTED

## 2020-11-19 SURGERY — IR WITH ANESTHESIA
Anesthesia: General

## 2020-11-19 MED ORDER — CHLORHEXIDINE GLUCONATE CLOTH 2 % EX PADS
6.0000 | MEDICATED_PAD | Freq: Every day | CUTANEOUS | Status: DC
  Administered 2020-11-19 – 2020-11-21 (×3): 6 via TOPICAL

## 2020-11-19 MED ORDER — LABETALOL HCL 5 MG/ML IV SOLN: 10.0000 mg | INTRAVENOUS | Status: DC | PRN

## 2020-11-19 MED ORDER — SUGAMMADEX SODIUM 200 MG/2ML IV SOLN
INTRAVENOUS | Status: DC | PRN
  Administered 2020-11-19: 200 mg via INTRAVENOUS

## 2020-11-19 MED ORDER — ORAL CARE MOUTH RINSE: 15.0000 mL | Freq: Once | OROMUCOSAL | Status: AC

## 2020-11-19 MED ORDER — SCOPOLAMINE 1 MG/3DAYS TD PT72
1.0000 | MEDICATED_PATCH | Freq: Once | TRANSDERMAL | Status: DC
  Administered 2020-11-19: 1.5 mg via TRANSDERMAL

## 2020-11-19 MED ORDER — FENTANYL CITRATE (PF) 250 MCG/5ML IJ SOLN
INTRAMUSCULAR | Status: DC | PRN
  Administered 2020-11-19: 100 ug via INTRAVENOUS

## 2020-11-19 MED ORDER — TICAGRELOR 90 MG PO TABS
90.0000 mg | ORAL_TABLET | Freq: Two times a day (BID) | ORAL | Status: DC
  Administered 2020-11-20 – 2020-11-22 (×5): 90 mg via ORAL
  Filled 2020-11-19 (×5): qty 1

## 2020-11-19 MED ORDER — IOHEXOL 300 MG/ML  SOLN
100.0000 mL | Freq: Once | INTRAMUSCULAR | Status: AC | PRN
  Administered 2020-11-19: 50 mL via INTRA_ARTERIAL

## 2020-11-19 MED ORDER — ROCURONIUM BROMIDE 10 MG/ML (PF) SYRINGE
PREFILLED_SYRINGE | INTRAVENOUS | Status: DC | PRN
  Administered 2020-11-19: 20 mg via INTRAVENOUS
  Administered 2020-11-19 (×2): 10 mg via INTRAVENOUS
  Administered 2020-11-19: 80 mg via INTRAVENOUS

## 2020-11-19 MED ORDER — PANTOPRAZOLE SODIUM 40 MG IV SOLR
40.0000 mg | Freq: Every day | INTRAVENOUS | Status: DC
  Administered 2020-11-19 – 2020-11-21 (×3): 40 mg via INTRAVENOUS
  Filled 2020-11-19 (×3): qty 40

## 2020-11-19 MED ORDER — EPTIFIBATIDE 75 MG/100ML IV SOLN
2.0000 ug/kg/min | INTRAVENOUS | Status: AC
  Administered 2020-11-19 – 2020-11-20 (×4): 2 ug/kg/min via INTRAVENOUS
  Filled 2020-11-19 (×4): qty 100

## 2020-11-19 MED ORDER — PHENYLEPHRINE HCL-NACL 20-0.9 MG/250ML-% IV SOLN
INTRAVENOUS | Status: DC | PRN
  Administered 2020-11-19: 50 ug/min via INTRAVENOUS

## 2020-11-19 MED ORDER — DEXAMETHASONE SODIUM PHOSPHATE 10 MG/ML IJ SOLN
INTRAMUSCULAR | Status: DC | PRN
  Administered 2020-11-19: 5 mg via INTRAVENOUS

## 2020-11-19 MED ORDER — LABETALOL HCL 5 MG/ML IV SOLN
INTRAVENOUS | Status: DC | PRN
  Administered 2020-11-19: 10 mg via INTRAVENOUS

## 2020-11-19 MED ORDER — LIDOCAINE HCL 1 % IJ SOLN
INTRAMUSCULAR | Status: AC
  Filled 2020-11-19: qty 20

## 2020-11-19 MED ORDER — PROPOFOL 10 MG/ML IV BOLUS
INTRAVENOUS | Status: DC | PRN
  Administered 2020-11-19: 120 mg via INTRAVENOUS

## 2020-11-19 MED ORDER — PHENYLEPHRINE 40 MCG/ML (10ML) SYRINGE FOR IV PUSH (FOR BLOOD PRESSURE SUPPORT)
PREFILLED_SYRINGE | INTRAVENOUS | Status: DC | PRN
  Administered 2020-11-19: 80 ug via INTRAVENOUS
  Administered 2020-11-19: 40 ug via INTRAVENOUS

## 2020-11-19 MED ORDER — EPTIFIBATIDE 20 MG/10ML IV SOLN
INTRAVENOUS | Status: AC
  Filled 2020-11-19: qty 10

## 2020-11-19 MED ORDER — ONDANSETRON HCL 4 MG/2ML IJ SOLN
INTRAMUSCULAR | Status: DC | PRN
  Administered 2020-11-19: 4 mg via INTRAVENOUS

## 2020-11-19 MED ORDER — LIDOCAINE 2% (20 MG/ML) 5 ML SYRINGE
INTRAMUSCULAR | Status: DC | PRN
  Administered 2020-11-19: 100 mg via INTRAVENOUS

## 2020-11-19 MED ORDER — LACTATED RINGERS IV SOLN: INTRAVENOUS | Status: DC

## 2020-11-19 MED ORDER — MORPHINE SULFATE (PF) 2 MG/ML IV SOLN
1.0000 mg | INTRAVENOUS | Status: DC | PRN
  Administered 2020-11-19: 2 mg via INTRAVENOUS
  Administered 2020-11-20: 1 mg via INTRAVENOUS
  Administered 2020-11-20: 2 mg via INTRAVENOUS
  Filled 2020-11-19 (×3): qty 1

## 2020-11-19 MED ORDER — HEPARIN SODIUM (PORCINE) 1000 UNIT/ML IJ SOLN
INTRAMUSCULAR | Status: DC | PRN
  Administered 2020-11-19: 1000 [IU] via INTRAVENOUS
  Administered 2020-11-19: 5000 [IU] via INTRAVENOUS
  Administered 2020-11-19 (×2): 1000 [IU] via INTRAVENOUS

## 2020-11-19 MED ORDER — EPTIFIBATIDE 20 MG/10ML IV SOLN
INTRAVENOUS | Status: DC | PRN
  Administered 2020-11-19 (×3): 2 mg via INTRA_ARTERIAL
  Administered 2020-11-19: 8 mg via INTRA_ARTERIAL
  Administered 2020-11-19: 2 mg via INTRA_ARTERIAL

## 2020-11-19 MED ORDER — LACTATED RINGERS IV SOLN: INTRAVENOUS | Status: DC | PRN

## 2020-11-19 MED ORDER — SCOPOLAMINE 1 MG/3DAYS TD PT72
MEDICATED_PATCH | TRANSDERMAL | Status: AC
  Filled 2020-11-19: qty 1

## 2020-11-19 MED ORDER — CHLORHEXIDINE GLUCONATE 0.12 % MT SOLN
15.0000 mL | Freq: Once | OROMUCOSAL | Status: AC
  Administered 2020-11-19: 15 mL via OROMUCOSAL

## 2020-11-19 MED ORDER — SODIUM CHLORIDE 0.9 % IV SOLN: INTRAVENOUS | Status: DC

## 2020-11-19 MED ORDER — CLOPIDOGREL BISULFATE 75 MG PO TABS
75.0000 mg | ORAL_TABLET | Freq: Every day | ORAL | Status: DC
  Administered 2020-11-19: 75 mg via ORAL
  Filled 2020-11-19: qty 1

## 2020-11-19 NOTE — Anesthesia Procedure Notes (Signed)
Arterial Line Insertion Start/End10/21/2022 2:22 PM, 11/19/2020 2:32 PM Performed by: Babs Bertin, CRNA, CRNA  Patient location: Pre-op. Preanesthetic checklist: patient identified, IV checked, site marked, risks and benefits discussed, surgical consent, monitors and equipment checked, pre-op evaluation, timeout performed and anesthesia consent Lidocaine 1% used for infiltration Left, radial was placed Catheter size: 20 G Hand hygiene performed  and maximum sterile barriers used   Attempts: 1 Procedure performed without using ultrasound guided technique. Following insertion, dressing applied and Biopatch. Post procedure assessment: normal  Patient tolerated the procedure well with no immediate complications.

## 2020-11-19 NOTE — Anesthesia Procedure Notes (Signed)
Procedure Name: Intubation Date/Time: 11/19/2020 3:38 PM Performed by: Imagene Riches, CRNA Pre-anesthesia Checklist: Patient identified, Emergency Drugs available, Suction available and Patient being monitored Patient Re-evaluated:Patient Re-evaluated prior to induction Oxygen Delivery Method: Circle System Utilized Preoxygenation: Pre-oxygenation with 100% oxygen Induction Type: IV induction Ventilation: Mask ventilation without difficulty Laryngoscope Size: Miller and 2 Grade View: Grade I Tube type: Oral Tube size: 7.0 mm Number of attempts: 1 Airway Equipment and Method: Stylet and Oral airway Placement Confirmation: ETT inserted through vocal cords under direct vision, positive ETCO2 and breath sounds checked- equal and bilateral Secured at: 20 cm Tube secured with: Tape Dental Injury: Teeth and Oropharynx as per pre-operative assessment

## 2020-11-19 NOTE — Transfer of Care (Signed)
Immediate Anesthesia Transfer of Care Note  Patient: Tiffany Horn  Procedure(s) Performed: stent supported coiling of basilar aneurysm  Patient Location: NICU  Anesthesia Type:General  Level of Consciousness: awake, alert  and oriented  Airway & Oxygen Therapy: Patient Spontanous Breathing  Post-op Assessment: Report given to RN and Post -op Vital signs reviewed and stable  Post vital signs: Reviewed and stable  Last Vitals:  Vitals Value Taken Time  BP    Temp    Pulse 80 11/19/20 2036  Resp 15 11/19/20 2036  SpO2 95 % 11/19/20 2036  Vitals shown include unvalidated device data.  Last Pain:  Vitals:   11/19/20 1425  TempSrc:   PainSc: 0-No pain      Patients Stated Pain Goal: 0 (19/75/88 3254)  Complications: No notable events documented.

## 2020-11-19 NOTE — Progress Notes (Signed)
  NEUROSURGERY PROGRESS NOTE   No issues overnight. Had episode of diplopia yesterday, currently doing well.  EXAM:  BP 130/69 (BP Location: Left Arm)   Pulse 77   Temp 97.6 F (36.4 C) (Oral)   Resp 18   Ht 5\' 7"  (1.702 m)   Wt 79.4 kg   LMP 01/31/2007   SpO2 98%   BMI 27.42 kg/m   Awake, alert, oriented  Speech fluent, appropriate  CN grossly intact  5/5 BUE/BLE   IMPRESSION:  60 y.o. 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 - Cont ASA 325mg  and Plavix 75mg  daily, with dose today - Pt had outpatient UTI, finish course of ceftriaxone  I have again reviewed the plan with the patient. All her questions today were answered and she is eager to proceed.   Consuella Lose, MD [facility]

## 2020-11-19 NOTE — Anesthesia Preprocedure Evaluation (Signed)
Anesthesia Evaluation  Patient identified by MRN, date of birth, ID band Patient awake    Reviewed: Allergy & Precautions, H&P , NPO status , Patient's Chart, lab work & pertinent test results, reviewed documented beta blocker date and time   History of Anesthesia Complications (+) PONV and history of anesthetic complications  Airway Mallampati: II  TM Distance: >3 FB Neck ROM: full    Dental no notable dental hx.    Pulmonary    breath sounds clear to auscultation       Cardiovascular hypertension, Normal cardiovascular exam Rhythm:regular     Neuro/Psych  Headaches, PSYCHIATRIC DISORDERS Anxiety Depression    GI/Hepatic Neg liver ROS,   Endo/Other  Hypothyroidism   Renal/GU negative Renal ROS  negative genitourinary   Musculoskeletal   Abdominal Normal abdominal exam  (+)   Peds  Hematology negative hematology ROS (+)   Anesthesia Other Findings See surgeon's H&P   Reproductive/Obstetrics negative OB ROS                             Anesthesia Physical  Anesthesia Plan  ASA: III  Anesthesia Plan: General   Post-op Pain Management:    Induction: Intravenous  PONV Risk Score and Plan:   Airway Management Planned: Oral ETT  Additional Equipment: None  Intra-op Plan:   Post-operative Plan: Extubation in OR  Informed Consent: I have reviewed the patients History and Physical, chart, labs and discussed the procedure including the risks, benefits and alternatives for the proposed anesthesia with the patient or authorized representative who has indicated his/her understanding and acceptance.     Dental Advisory Given  Plan Discussed with: CRNA and Surgeon  Anesthesia Plan Comments:         Anesthesia Quick Evaluation

## 2020-11-20 LAB — CBC
HCT: 37 % (ref 36.0–46.0)
Hemoglobin: 12.3 g/dL (ref 12.0–15.0)
MCH: 30.8 pg (ref 26.0–34.0)
MCHC: 33.2 g/dL (ref 30.0–36.0)
MCV: 92.7 fL (ref 80.0–100.0)
Platelets: 210 10*3/uL (ref 150–400)
RBC: 3.99 MIL/uL (ref 3.87–5.11)
RDW: 12.9 % (ref 11.5–15.5)
WBC: 6.9 10*3/uL (ref 4.0–10.5)
nRBC: 0 % (ref 0.0–0.2)

## 2020-11-20 MED ORDER — ASPIRIN 81 MG PO CHEW
81.0000 mg | CHEWABLE_TABLET | Freq: Every day | ORAL | Status: DC
  Administered 2020-11-21 – 2020-11-22 (×2): 81 mg via ORAL
  Filled 2020-11-20 (×2): qty 1

## 2020-11-20 NOTE — Progress Notes (Signed)
Patients name and DOB was verified, 71fr right groin sheath was removed at 19:07 and pressure was held on the artery for 25 minutes. A quick clot was applied to the site. After pressure was held the nurse was notified and looked at the groin.                                Jeanine Caven RT (R)

## 2020-11-20 NOTE — Progress Notes (Signed)
Subjective: Patient reports no headache or vision changes  Objective: Vital signs in last 24 hours: Temp:  [97.6 F (36.4 C)-99.2 F (37.3 C)] 99.2 F (37.3 C) (10/22 0800) Pulse Rate:  [66-83] 81 (10/22 0900) Resp:  [8-23] 12 (10/22 0900) BP: (102-149)/(63-87) 114/67 (10/22 0900) SpO2:  [90 %-98 %] 96 % (10/22 0900) Weight:  [79.4 kg] 79.4 kg (10/21 1406)  Intake/Output from previous day: 10/21 0701 - 10/22 0700 In: 850.3 [I.V.:850.3] Out: 600 [Urine:600] Intake/Output this shift: Total I/O In: 12.8 [I.V.:12.8] Out: -  NAD, A+Ox3 Lying flat in bed Sheath site soft, good distal pulses.  VFC, FC x 4  Lab Results: Recent Labs    11/20/20 0724  WBC 6.9  HGB 12.3  HCT 37.0  PLT 210   BMET No results for input(s): NA, K, CL, CO2, GLUCOSE, BUN, CREATININE, CALCIUM in the last 72 hours.  Studies/Results: IR Transcath/Emboliz  Result Date: 11/19/2020 PROCEDURE: DIAGNOSTIC CEREBRAL ANGIOGRAM COIL EMBOLIZATION OF BASILAR ANEURYSM HISTORY: The patient is a 60 year old woman presenting to the emergency department with several episodes of diplopia and approximally 1 week of significantly worsening headache. 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 She was therefore admitted to the hospital. After review of CTA it was felt that she would likely be a candidate for stent supported coiling. It was felt that her aneurysm was likely the cause of her new symptoms likely due to enlargement and irritation of the oculomotor nerve. Patient was loaded with 300 mg of Plavix and 325 mg of aspirin. ACCESS: The technical aspects of the procedure as well as its potential risks and benefits were reviewed with the patient and patient's family. These risks included but were not limited to stroke, intracranial hemorrhage, bleeding, infection, allergic reaction, damage to organs or vital structures, stroke,  non-diagnostic procedure, and the catastrophic outcomes of heart attack, coma, and death. With an understanding of these risks, informed consent was obtained and witnessed. The patient was placed in the supine position on the angiography table and the skin of right groin prepped in the usual sterile fashion. The procedure was performed under general anesthesia. A 5-French sheath was introduced in the right common femoral artery using Seldinger technique. MEDICATIONS: HEPARIN: 8000 Units total. INTEGRILIN: 16mg  IA, 28mcg/kg/min IV continuous infusion CONTRAST:  51mL OMNIPAQUE IOHEXOL 300 MG/ML SOLN, 37mL OMNIPAQUE IOHEXOL 300 MG/ML SOLN, 63mL OMNIPAQUE IOHEXOL 300 MG/ML SOLNcc, Omnipaque 300 FLUOROSCOPY TIME:  FLUOROSCOPY TIME: See IR records TECHNIQUE: CATHETERS AND WIRES 5-French JB-1 catheter 180 cm 0.035" glidewire 6-French NeuronMax guide sheath 6-French Berenstein Select JB-1 catheter 0.070 Sofia guide catheter 0.058" CatV guidecatheter Synchro select standard microwire Synchro select soft microwire Asahi Chikai 10 microwire Excelsior XT-17 microcatheter x 2 COILS/STENTS USED Target 3D 6 mm x 15 cm Target 360 XL soft 5 mm by 10 cm Target 360 XL 4 mm x 12 cm Target 360 XL 3 mm x 9 cm not deployed Target 360 ultra 2 mm x 4 cm Target 360 ultra 2.5 mm x 4 cm x2 Target 360 ultra 2 mm x 4 cm x2 Neuroform Atlas 3.0 mm x 24 mm VESSELS CATHETERIZED Right internal carotid Left internal carotid Left vertebral Right vertebral Right common femoral Basilar artery Right posterior cerebral artery VESSELS STUDIED Right internal carotid, head Left internal carotid, head Left vertebral Right vertebral Right vertebral artery, head (during embolization) Right vertebral artery, head (post-coiling) Left vertebral artery, head (pre stent) Left vertebral artery,  head (immediate post stent) Left vertebral artery, head (during coiling) Left vertebral artery, head (immediate post coiling) Left vertebral artery, head (post thrombolysis)  PROCEDURAL NARRATIVE A 5-Fr JB-1 glide catheter was advanced over a 0.035 glidewire into the aortic arch. The above vessels were then sequentially catheterized and cervical / cerebral angiograms taken. After review of images, the catheter was removed without incident. The 5-Fr sheath was then exchanged over the glidewire for an 8-Fr sheath. Under real-time fluoroscopy, the guide sheath was advanced over the select catheter and glidewire into the descending aorta. 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 The guide sheath was then advanced into the proximal right vertebral artery. The Select catheter was removed and the 070 guide catheter was coaxially introduced over both the XT-17 micro catheters and synchro select micro wires. The microcatheters were then advanced under roadmap guidance into the distal cervical vertebral artery. The guide catheter was then tracked over the microcatheter into the distal vertebral artery. The micro catheters were then further advanced into the proximal basilar artery. This allowed advancement of the guide catheter into the proximal basilar artery. Initially, multiple attempts were made to access the right posterior cerebral artery unsuccessfully. This included a combination of different shape microcatheter tip. I then attempted access with the Chikai 10 microwire again with multiple different tip configurations all unsuccessfully. At this point I elected to attempt to place coils within the aneurysm without the aid of the stent as it did not appear access to the right posterior cerebral artery would be feasible. The three-dimensional coil as well as several filling coils were placed into the aneurysm. There is a small amount of coil loop extending down near the origin of the right posterior cerebral artery. As the last coil was being placed, some coil loops appear to be extending down  further to words the origin of the posterior cerebral arteries and I therefore elected to stop coiling at this point. Angiogram was taken and the micro catheters were withdrawn down into the guide catheter. Guide catheter was then withdrawn down into the cervical vertebral artery and angiogram was taken. This did reveal decreased flow through the right posterior cerebral artery suggesting some compromise of the ostium possibly due to coil mass. I therefore elected to attempt to place a stent by accessing the left vertebral artery. The guide catheter was removed, and the guide sheath was withdrawn down into the descending aorta as was the guide sheath. The select catheter was introduced over the Glidewire, and the left subclavian followed by the proximal left vertebral artery were selected. Guide sheath was then advanced into the proximal left vertebral artery. Under roadmap guidance, the 058 CT 5 guide catheter was introduced over and XT 17 microcatheter and synchro select microwire. These were then navigated until the CAT 5 guide catheter was in the proximal basilar artery. The synchro select microwire was then used to access the right posterior cerebral artery. I was able to gain access to this vessel by buttressing against the coil mass already placed in the aneurysm. Microcatheter was then tracked into the P2 segment. Microwire was removed, and the above stent was introduced. Stent was then deployed under roadmap guidance without incident. The microcatheter was then advanced over the pushing wire into the lumen of the stent. Angiogram was then taken. It did appear that flow through the right posterior cerebral artery was improved in comparison to the prior angiogram. With the  posterior cerebral artery somewhat protected, we elected to attempt to coil more of the residual aneurysm. The microwire was introduced into the microcatheter, and advanced into the lumen of the aneurysm through the stent. Microcatheter was  then advanced into the residual aneurysm. The remainder of the above coils were then sequentially placed. After several coils were placed, I elected to complete the coiling procedure as I did not want to risk coil herniation through the tines of the stent and further compromise the posterior cerebral arteries. The coiling catheter was withdrawn, and the guide catheter was withdrawn down into the cervical vertebral artery. At this point, angiogram was taken which appear to reveal more decreased flow in the right posterior cerebral artery and what appear to be filling defect in the inferior wall of the right posterior cerebral artery adjacent to the coil mass. Therefore under roadmap guidance, I advanced the microcatheter over the microwire back into the lumen of the stent. I then administered a 8 mg bolus dose of Integrilin intra-arterially. After approximately 10 minutes, angiogram was taken which did reveal somewhat increased flow through the right posterior cerebral artery and better perfusion. I then administered another 8 mg bolus dose intra-arterially. After another 10 minutes, another angiogram was taken again revealing improved flow through the right posterior cerebral artery. At this point, the pharmacy was able to deliver intravenous Integrilin which was started at a rate of 2 micrograms/kg/minute with the plan to continue for 18 hours. With improved flow in the right posterior cerebral artery, I elected to complete the procedure at this point. The guide catheter and guide sheath were then synchronously removed without incident. FINDINGS: Right internal carotid, head: Injection reveals the presence of a widely patent ICA, M1, and A1 segments and their branches. No aneurysms, AVMs, or high-flow fistulas are seen. Of note, the posterior communicating artery is very small and no visualization of the posterior circulation is noted. The parenchymal and venous phases are normal. The venous sinuses are widely  patent. Left internal carotid, head: Injection reveals the presence of a widely patent ICA, A1, and M1 segments and their branches. No aneurysms, AVMs, or high-flow fistulas are seen. Similar to the contralateral side, there is no significant visualization of the left posterior communicating artery. The parenchymal and venous phases are normal. The venous sinuses are widely patent. Left vertebral: Injection reveals the presence of a widely patent vertebral artery. This leads to a widely patent basilar artery that terminates in bilateral P1. 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 This aneurysm measures approximately 6.8 x 5.8 mm with a neck measuring approximately 4.5 mm. The parenchymal and venous phases are normal. The venous sinuses are widely patent. Right vertebral: The vertebral artery is widely patent. No PICA aneurysm is seen. See basilar description above. Right vertebral artery, head (during embolization) Angiograms taken during coiling of the basilar aneurysm through the right vertebral artery access demonstrate progressive occlusion primarily of the dome of the aneurysm. The right vertebral artery and bilateral posterior cerebral arteries appear to be widely patent. There is residual aneurysm both at the neck and centrally within the aneurysm. No filling defects are noted. Right vertebral artery, head (post coiling) Angiogram taken after coiling through the right vertebral artery reveal occlusion of the dome of the aneurysm although there is residual aneurysm centrally and at the neck. There are small coil loops which appear to be in proximity to the  origin of the right posterior cerebral artery. No filling defects are seen however flow through the right posterior cerebral artery is delayed in comparison to the left PCA, as well as in comparison to earlier angiograms suggesting flow limitation.  Left vertebral artery, head (pre stent) Angiogram taken through the guide catheter in the left vertebral artery again demonstrates coil mass within the aneurysm with the residual filling as described above. Again seen is delayed filling of the right posterior cerebral artery with decreased perfusion in comparison to prior angiograms. Again, no filling defects are seen to suggest thrombus. Left vertebral artery, head (immediate post stent) Angiogram reveals widely patent left vertebral artery with stent extending from the mid basilar into the right P2 segment. No filling defects are seen. Flow within the right posterior cerebral artery appears to be improved in comparison to the pre stent angiogram with more symmetric filling of the bilateral posterior cerebral arteries. Left vertebral artery, head (during coiling) Angiogram reveals further coil mass within the residual aneurysm which is progressively occluded. No filling defects are noted. The posterior cerebral arteries remain patent. Left vertebral artery, head (immediate post coiling) Angiogram taken from the left vertebral artery reveals patent basilar artery. The left posterior cerebral artery remains widely patent. Stent is in stable position. There is coil mass within the aneurysm, with minimal neck residual. There is decreased flow through the right posterior cerebral artery with filling defect in the inferior wall of the right P1 adjacent to the distal aneurysm neck likely representing thrombus. There is decreased perfusion of the right posterior cerebral artery territory in comparison to the prior angiograms. Left vertebral artery, head (post thrombolysis) Angiograms taken 10 minutes after initial 8 mg bolus dose and taken again 10 minutes after the second 8 mg bolus dose sequentially reveal improvement in flow through the right posterior cerebral artery. There is also much smaller filling defect along the wall of the posterior cerebral artery adjacent to  the coil mass indicating dissolution of some of the thrombus. The final angiogram does show minimally decreased flow through the right posterior cerebral artery with significantly improved perfusion of the right PCA territory in comparison to the previous angiograms. Venous sinuses remain widely patent. DISPOSITION: Upon completion of the study, the femoral sheath was secured in place with nylon suture and connected to a pressure system. The patient was extubated and transferred to the postanesthesia care unit in stable hemodynamic condition. IMPRESSION: 1. 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 Procedure was complicated by stent thrombosis treated with intra arterial and intravenous Integrilin. The preliminary results of this procedure were shared with the patient's family. Electronically Signed   By: Consuella Lose   On: 11/19/2020 20:28   IR Angiogram Follow Up Study  Result Date: 11/19/2020 PROCEDURE: DIAGNOSTIC CEREBRAL ANGIOGRAM COIL EMBOLIZATION OF BASILAR ANEURYSM HISTORY: The patient is a 60 year old woman presenting to the emergency department with several episodes of diplopia and approximally 1 week of significantly worsening headache. 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 She was therefore admitted to the hospital. After review of CTA it was felt that she would likely be a candidate for stent supported coiling. It was felt that her aneurysm was likely the cause of her new symptoms likely due to enlargement and irritation of the oculomotor nerve. Patient was loaded with 300 mg of Plavix and 325 mg of aspirin. ACCESS: The technical  aspects of the procedure as well as its potential risks and benefits were reviewed with the patient and patient's family. These risks included but were not limited to stroke, intracranial  hemorrhage, bleeding, infection, allergic reaction, damage to organs or vital structures, stroke, non-diagnostic procedure, and the catastrophic outcomes of heart attack, coma, and death. With an understanding of these risks, informed consent was obtained and witnessed. The patient was placed in the supine position on the angiography table and the skin of right groin prepped in the usual sterile fashion. The procedure was performed under general anesthesia. A 5-French sheath was introduced in the right common femoral artery using Seldinger technique. MEDICATIONS: HEPARIN: 8000 Units total. INTEGRILIN: 16mg  IA, 48mcg/kg/min IV continuous infusion CONTRAST:  37mL OMNIPAQUE IOHEXOL 300 MG/ML SOLN, 5mL OMNIPAQUE IOHEXOL 300 MG/ML SOLN, 66mL OMNIPAQUE IOHEXOL 300 MG/ML SOLNcc, Omnipaque 300 FLUOROSCOPY TIME:  FLUOROSCOPY TIME: See IR records TECHNIQUE: CATHETERS AND WIRES 5-French JB-1 catheter 180 cm 0.035" glidewire 6-French NeuronMax guide sheath 6-French Berenstein Select JB-1 catheter 0.070 Sofia guide catheter 0.058" CatV guidecatheter Synchro select standard microwire Synchro select soft microwire Asahi Chikai 10 microwire Excelsior XT-17 microcatheter x 2 COILS/STENTS USED Target 3D 6 mm x 15 cm Target 360 XL soft 5 mm by 10 cm Target 360 XL 4 mm x 12 cm Target 360 XL 3 mm x 9 cm not deployed Target 360 ultra 2 mm x 4 cm Target 360 ultra 2.5 mm x 4 cm x2 Target 360 ultra 2 mm x 4 cm x2 Neuroform Atlas 3.0 mm x 24 mm VESSELS CATHETERIZED Right internal carotid Left internal carotid Left vertebral Right vertebral Right common femoral Basilar artery Right posterior cerebral artery VESSELS STUDIED Right internal carotid, head Left internal carotid, head Left vertebral Right vertebral Right vertebral artery, head (during embolization) Right vertebral artery, head (post-coiling) Left vertebral artery, head (pre stent) Left vertebral artery, head (immediate post stent) Left vertebral artery, head (during coiling) Left  vertebral artery, head (immediate post coiling) Left vertebral artery, head (post thrombolysis) PROCEDURAL NARRATIVE A 5-Fr JB-1 glide catheter was advanced over a 0.035 glidewire into the aortic arch. The above vessels were then sequentially catheterized and cervical / cerebral angiograms taken. After review of images, the catheter was removed without incident. The 5-Fr sheath was then exchanged over the glidewire for an 8-Fr sheath. Under real-time fluoroscopy, the guide sheath was advanced over the select catheter and glidewire into the descending aorta. 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 The guide sheath was then advanced into the proximal right vertebral artery. The Select catheter was removed and the 070 guide catheter was coaxially introduced over both the XT-17 micro catheters and synchro select micro wires. The microcatheters were then advanced under roadmap guidance into the distal cervical vertebral artery. The guide catheter was then tracked over the microcatheter into the distal vertebral artery. The micro catheters were then further advanced into the proximal basilar artery. This allowed advancement of the guide catheter into the proximal basilar artery. Initially, multiple attempts were made to access the right posterior cerebral artery unsuccessfully. This included a combination of different shape microcatheter tip. I then attempted access with the Chikai 10 microwire again with multiple different tip configurations all unsuccessfully. At this point I elected to attempt to place coils within the aneurysm without the aid of the stent as it did not appear access to the right posterior cerebral artery would be feasible. The three-dimensional  coil as well as several filling coils were placed into the aneurysm. There is a small amount of coil loop extending down near the origin of the right posterior  cerebral artery. As the last coil was being placed, some coil loops appear to be extending down further to words the origin of the posterior cerebral arteries and I therefore elected to stop coiling at this point. Angiogram was taken and the micro catheters were withdrawn down into the guide catheter. Guide catheter was then withdrawn down into the cervical vertebral artery and angiogram was taken. This did reveal decreased flow through the right posterior cerebral artery suggesting some compromise of the ostium possibly due to coil mass. I therefore elected to attempt to place a stent by accessing the left vertebral artery. The guide catheter was removed, and the guide sheath was withdrawn down into the descending aorta as was the guide sheath. The select catheter was introduced over the Glidewire, and the left subclavian followed by the proximal left vertebral artery were selected. Guide sheath was then advanced into the proximal left vertebral artery. Under roadmap guidance, the 058 CT 5 guide catheter was introduced over and XT 17 microcatheter and synchro select microwire. These were then navigated until the CAT 5 guide catheter was in the proximal basilar artery. The synchro select microwire was then used to access the right posterior cerebral artery. I was able to gain access to this vessel by buttressing against the coil mass already placed in the aneurysm. Microcatheter was then tracked into the P2 segment. Microwire was removed, and the above stent was introduced. Stent was then deployed under roadmap guidance without incident. The microcatheter was then advanced over the pushing wire into the lumen of the stent. Angiogram was then taken. It did appear that flow through the right posterior cerebral artery was improved in comparison to the prior angiogram. With the posterior cerebral artery somewhat protected, we elected to attempt to coil more of the residual aneurysm. The microwire was introduced into the  microcatheter, and advanced into the lumen of the aneurysm through the stent. Microcatheter was then advanced into the residual aneurysm. The remainder of the above coils were then sequentially placed. After several coils were placed, I elected to complete the coiling procedure as I did not want to risk coil herniation through the tines of the stent and further compromise the posterior cerebral arteries. The coiling catheter was withdrawn, and the guide catheter was withdrawn down into the cervical vertebral artery. At this point, angiogram was taken which appear to reveal more decreased flow in the right posterior cerebral artery and what appear to be filling defect in the inferior wall of the right posterior cerebral artery adjacent to the coil mass. Therefore under roadmap guidance, I advanced the microcatheter over the microwire back into the lumen of the stent. I then administered a 8 mg bolus dose of Integrilin intra-arterially. After approximately 10 minutes, angiogram was taken which did reveal somewhat increased flow through the right posterior cerebral artery and better perfusion. I then administered another 8 mg bolus dose intra-arterially. After another 10 minutes, another angiogram was taken again revealing improved flow through the right posterior cerebral artery. At this point, the pharmacy was able to deliver intravenous Integrilin which was started at a rate of 2 micrograms/kg/minute with the plan to continue for 18 hours. With improved flow in the right posterior cerebral artery, I elected to complete the procedure at this point. The guide catheter and guide sheath were then synchronously  removed without incident. FINDINGS: Right internal carotid, head: Injection reveals the presence of a widely patent ICA, M1, and A1 segments and their branches. No aneurysms, AVMs, or high-flow fistulas are seen. Of note, the posterior communicating artery is very small and no visualization of the posterior  circulation is noted. The parenchymal and venous phases are normal. The venous sinuses are widely patent. Left internal carotid, head: Injection reveals the presence of a widely patent ICA, A1, and M1 segments and their branches. No aneurysms, AVMs, or high-flow fistulas are seen. Similar to the contralateral side, there is no significant visualization of the left posterior communicating artery. The parenchymal and venous phases are normal. The venous sinuses are widely patent. Left vertebral: Injection reveals the presence of a widely patent vertebral artery. This leads to a widely patent basilar artery that terminates in bilateral P1. 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 This aneurysm measures approximately 6.8 x 5.8 mm with a neck measuring approximately 4.5 mm. The parenchymal and venous phases are normal. The venous sinuses are widely patent. Right vertebral: The vertebral artery is widely patent. No PICA aneurysm is seen. See basilar description above. Right vertebral artery, head (during embolization) Angiograms taken during coiling of the basilar aneurysm through the right vertebral artery access demonstrate progressive occlusion primarily of the dome of the aneurysm. The right vertebral artery and bilateral posterior cerebral arteries appear to be widely patent. There is residual aneurysm both at the neck and centrally within the aneurysm. No filling defects are noted. Right vertebral artery, head (post coiling) Angiogram taken after coiling through the right vertebral artery reveal occlusion of the dome of the aneurysm although there is residual aneurysm centrally and at the neck. There are small coil loops which appear to be in proximity to the origin of the right posterior cerebral artery. No filling defects are seen however flow through the right posterior cerebral artery is delayed in  comparison to the left PCA, as well as in comparison to earlier angiograms suggesting flow limitation. Left vertebral artery, head (pre stent) Angiogram taken through the guide catheter in the left vertebral artery again demonstrates coil mass within the aneurysm with the residual filling as described above. Again seen is delayed filling of the right posterior cerebral artery with decreased perfusion in comparison to prior angiograms. Again, no filling defects are seen to suggest thrombus. Left vertebral artery, head (immediate post stent) Angiogram reveals widely patent left vertebral artery with stent extending from the mid basilar into the right P2 segment. No filling defects are seen. Flow within the right posterior cerebral artery appears to be improved in comparison to the pre stent angiogram with more symmetric filling of the bilateral posterior cerebral arteries. Left vertebral artery, head (during coiling) Angiogram reveals further coil mass within the residual aneurysm which is progressively occluded. No filling defects are noted. The posterior cerebral arteries remain patent. Left vertebral artery, head (immediate post coiling) Angiogram taken from the left vertebral artery reveals patent basilar artery. The left posterior cerebral artery remains widely patent. Stent is in stable position. There is coil mass within the aneurysm, with minimal neck residual. There is decreased flow through the right posterior cerebral artery with filling defect in the inferior wall of the right P1 adjacent to the distal aneurysm neck likely representing thrombus. There is decreased perfusion of the right posterior cerebral artery territory in comparison to the prior angiograms. Left  vertebral artery, head (post thrombolysis) Angiograms taken 10 minutes after initial 8 mg bolus dose and taken again 10 minutes after the second 8 mg bolus dose sequentially reveal improvement in flow through the right posterior cerebral artery.  There is also much smaller filling defect along the wall of the posterior cerebral artery adjacent to the coil mass indicating dissolution of some of the thrombus. The final angiogram does show minimally decreased flow through the right posterior cerebral artery with significantly improved perfusion of the right PCA territory in comparison to the previous angiograms. Venous sinuses remain widely patent. DISPOSITION: Upon completion of the study, the femoral sheath was secured in place with nylon suture and connected to a pressure system. The patient was extubated and transferred to the postanesthesia care unit in stable hemodynamic condition. IMPRESSION: 1. 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 Procedure was complicated by stent thrombosis treated with intra arterial and intravenous Integrilin. The preliminary results of this procedure were shared with the patient's family. Electronically Signed   By: Consuella Lose   On: 11/19/2020 20:28   IR Angiogram Follow Up Study  Result Date: 11/19/2020 PROCEDURE: DIAGNOSTIC CEREBRAL ANGIOGRAM COIL EMBOLIZATION OF BASILAR ANEURYSM HISTORY: The patient is a 60 year old woman presenting to the emergency department with several episodes of diplopia and approximally 1 week of significantly worsening headache. 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 She was therefore admitted to the hospital. After review of CTA it was felt that she would likely be a candidate for stent supported coiling. It was felt that her aneurysm was likely the cause of her new symptoms likely due to enlargement and irritation of the oculomotor nerve. Patient was loaded with 300 mg of Plavix and 325 mg of aspirin. ACCESS: The technical aspects of the procedure as well as its potential risks and benefits were reviewed with the  patient and patient's family. These risks included but were not limited to stroke, intracranial hemorrhage, bleeding, infection, allergic reaction, damage to organs or vital structures, stroke, non-diagnostic procedure, and the catastrophic outcomes of heart attack, coma, and death. With an understanding of these risks, informed consent was obtained and witnessed. The patient was placed in the supine position on the angiography table and the skin of right groin prepped in the usual sterile fashion. The procedure was performed under general anesthesia. A 5-French sheath was introduced in the right common femoral artery using Seldinger technique. MEDICATIONS: HEPARIN: 8000 Units total. INTEGRILIN: 16mg  IA, 15mcg/kg/min IV continuous infusion CONTRAST:  1mL OMNIPAQUE IOHEXOL 300 MG/ML SOLN, 44mL OMNIPAQUE IOHEXOL 300 MG/ML SOLN, 49mL OMNIPAQUE IOHEXOL 300 MG/ML SOLNcc, Omnipaque 300 FLUOROSCOPY TIME:  FLUOROSCOPY TIME: See IR records TECHNIQUE: CATHETERS AND WIRES 5-French JB-1 catheter 180 cm 0.035" glidewire 6-French NeuronMax guide sheath 6-French Berenstein Select JB-1 catheter 0.070 Sofia guide catheter 0.058" CatV guidecatheter Synchro select standard microwire Synchro select soft microwire Asahi Chikai 10 microwire Excelsior XT-17 microcatheter x 2 COILS/STENTS USED Target 3D 6 mm x 15 cm Target 360 XL soft 5 mm by 10 cm Target 360 XL 4 mm x 12 cm Target 360 XL 3 mm x 9 cm not deployed Target 360 ultra 2 mm x 4 cm Target 360 ultra 2.5 mm x 4 cm x2 Target 360 ultra 2 mm x 4 cm x2 Neuroform Atlas 3.0 mm x 24 mm VESSELS CATHETERIZED Right internal carotid Left internal carotid Left vertebral Right  vertebral Right common femoral Basilar artery Right posterior cerebral artery VESSELS STUDIED Right internal carotid, head Left internal carotid, head Left vertebral Right vertebral Right vertebral artery, head (during embolization) Right vertebral artery, head (post-coiling) Left vertebral artery, head (pre stent) Left  vertebral artery, head (immediate post stent) Left vertebral artery, head (during coiling) Left vertebral artery, head (immediate post coiling) Left vertebral artery, head (post thrombolysis) PROCEDURAL NARRATIVE A 5-Fr JB-1 glide catheter was advanced over a 0.035 glidewire into the aortic arch. The above vessels were then sequentially catheterized and cervical / cerebral angiograms taken. After review of images, the catheter was removed without incident. The 5-Fr sheath was then exchanged over the glidewire for an 8-Fr sheath. Under real-time fluoroscopy, the guide sheath was advanced over the select catheter and glidewire into the descending aorta. 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 The guide sheath was then advanced into the proximal right vertebral artery. The Select catheter was removed and the 070 guide catheter was coaxially introduced over both the XT-17 micro catheters and synchro select micro wires. The microcatheters were then advanced under roadmap guidance into the distal cervical vertebral artery. The guide catheter was then tracked over the microcatheter into the distal vertebral artery. The micro catheters were then further advanced into the proximal basilar artery. This allowed advancement of the guide catheter into the proximal basilar artery. Initially, multiple attempts were made to access the right posterior cerebral artery unsuccessfully. This included a combination of different shape microcatheter tip. I then attempted access with the Chikai 10 microwire again with multiple different tip configurations all unsuccessfully. At this point I elected to attempt to place coils within the aneurysm without the aid of the stent as it did not appear access to the right posterior cerebral artery would be feasible. The three-dimensional coil as well as several filling coils were placed into the  aneurysm. There is a small amount of coil loop extending down near the origin of the right posterior cerebral artery. As the last coil was being placed, some coil loops appear to be extending down further to words the origin of the posterior cerebral arteries and I therefore elected to stop coiling at this point. Angiogram was taken and the micro catheters were withdrawn down into the guide catheter. Guide catheter was then withdrawn down into the cervical vertebral artery and angiogram was taken. This did reveal decreased flow through the right posterior cerebral artery suggesting some compromise of the ostium possibly due to coil mass. I therefore elected to attempt to place a stent by accessing the left vertebral artery. The guide catheter was removed, and the guide sheath was withdrawn down into the descending aorta as was the guide sheath. The select catheter was introduced over the Glidewire, and the left subclavian followed by the proximal left vertebral artery were selected. Guide sheath was then advanced into the proximal left vertebral artery. Under roadmap guidance, the 058 CT 5 guide catheter was introduced over and XT 17 microcatheter and synchro select microwire. These were then navigated until the CAT 5 guide catheter was in the proximal basilar artery. The synchro select microwire was then used to access the right posterior cerebral artery. I was able to gain access to this vessel by buttressing against the coil mass already placed in the aneurysm. Microcatheter was then tracked into the P2 segment. Microwire was removed, and the above stent was introduced. Stent was then deployed under  roadmap guidance without incident. The microcatheter was then advanced over the pushing wire into the lumen of the stent. Angiogram was then taken. It did appear that flow through the right posterior cerebral artery was improved in comparison to the prior angiogram. With the posterior cerebral artery somewhat  protected, we elected to attempt to coil more of the residual aneurysm. The microwire was introduced into the microcatheter, and advanced into the lumen of the aneurysm through the stent. Microcatheter was then advanced into the residual aneurysm. The remainder of the above coils were then sequentially placed. After several coils were placed, I elected to complete the coiling procedure as I did not want to risk coil herniation through the tines of the stent and further compromise the posterior cerebral arteries. The coiling catheter was withdrawn, and the guide catheter was withdrawn down into the cervical vertebral artery. At this point, angiogram was taken which appear to reveal more decreased flow in the right posterior cerebral artery and what appear to be filling defect in the inferior wall of the right posterior cerebral artery adjacent to the coil mass. Therefore under roadmap guidance, I advanced the microcatheter over the microwire back into the lumen of the stent. I then administered a 8 mg bolus dose of Integrilin intra-arterially. After approximately 10 minutes, angiogram was taken which did reveal somewhat increased flow through the right posterior cerebral artery and better perfusion. I then administered another 8 mg bolus dose intra-arterially. After another 10 minutes, another angiogram was taken again revealing improved flow through the right posterior cerebral artery. At this point, the pharmacy was able to deliver intravenous Integrilin which was started at a rate of 2 micrograms/kg/minute with the plan to continue for 18 hours. With improved flow in the right posterior cerebral artery, I elected to complete the procedure at this point. The guide catheter and guide sheath were then synchronously removed without incident. FINDINGS: Right internal carotid, head: Injection reveals the presence of a widely patent ICA, M1, and A1 segments and their branches. No aneurysms, AVMs, or high-flow fistulas  are seen. Of note, the posterior communicating artery is very small and no visualization of the posterior circulation is noted. The parenchymal and venous phases are normal. The venous sinuses are widely patent. Left internal carotid, head: Injection reveals the presence of a widely patent ICA, A1, and M1 segments and their branches. No aneurysms, AVMs, or high-flow fistulas are seen. Similar to the contralateral side, there is no significant visualization of the left posterior communicating artery. The parenchymal and venous phases are normal. The venous sinuses are widely patent. Left vertebral: Injection reveals the presence of a widely patent vertebral artery. This leads to a widely patent basilar artery that terminates in bilateral P1. 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 This aneurysm measures approximately 6.8 x 5.8 mm with a neck measuring approximately 4.5 mm. The parenchymal and venous phases are normal. The venous sinuses are widely patent. Right vertebral: The vertebral artery is widely patent. No PICA aneurysm is seen. See basilar description above. Right vertebral artery, head (during embolization) Angiograms taken during coiling of the basilar aneurysm through the right vertebral artery access demonstrate progressive occlusion primarily of the dome of the aneurysm. The right vertebral artery and bilateral posterior cerebral arteries appear to be widely patent. There is residual aneurysm both at the neck and centrally within the aneurysm. No filling defects are noted. Right vertebral  artery, head (post coiling) Angiogram taken after coiling through the right vertebral artery reveal occlusion of the dome of the aneurysm although there is residual aneurysm centrally and at the neck. There are small coil loops which appear to be in proximity to the origin of the right posterior cerebral artery.  No filling defects are seen however flow through the right posterior cerebral artery is delayed in comparison to the left PCA, as well as in comparison to earlier angiograms suggesting flow limitation. Left vertebral artery, head (pre stent) Angiogram taken through the guide catheter in the left vertebral artery again demonstrates coil mass within the aneurysm with the residual filling as described above. Again seen is delayed filling of the right posterior cerebral artery with decreased perfusion in comparison to prior angiograms. Again, no filling defects are seen to suggest thrombus. Left vertebral artery, head (immediate post stent) Angiogram reveals widely patent left vertebral artery with stent extending from the mid basilar into the right P2 segment. No filling defects are seen. Flow within the right posterior cerebral artery appears to be improved in comparison to the pre stent angiogram with more symmetric filling of the bilateral posterior cerebral arteries. Left vertebral artery, head (during coiling) Angiogram reveals further coil mass within the residual aneurysm which is progressively occluded. No filling defects are noted. The posterior cerebral arteries remain patent. Left vertebral artery, head (immediate post coiling) Angiogram taken from the left vertebral artery reveals patent basilar artery. The left posterior cerebral artery remains widely patent. Stent is in stable position. There is coil mass within the aneurysm, with minimal neck residual. There is decreased flow through the right posterior cerebral artery with filling defect in the inferior wall of the right P1 adjacent to the distal aneurysm neck likely representing thrombus. There is decreased perfusion of the right posterior cerebral artery territory in comparison to the prior angiograms. Left vertebral artery, head (post thrombolysis) Angiograms taken 10 minutes after initial 8 mg bolus dose and taken again 10 minutes after the second 8  mg bolus dose sequentially reveal improvement in flow through the right posterior cerebral artery. There is also much smaller filling defect along the wall of the posterior cerebral artery adjacent to the coil mass indicating dissolution of some of the thrombus. The final angiogram does show minimally decreased flow through the right posterior cerebral artery with significantly improved perfusion of the right PCA territory in comparison to the previous angiograms. Venous sinuses remain widely patent. DISPOSITION: Upon completion of the study, the femoral sheath was secured in place with nylon suture and connected to a pressure system. The patient was extubated and transferred to the postanesthesia care unit in stable hemodynamic condition. IMPRESSION: 1. 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 Procedure was complicated by stent thrombosis treated with intra arterial and intravenous Integrilin. The preliminary results of this procedure were shared with the patient's family. Electronically Signed   By: Consuella Lose   On: 11/19/2020 20:28   IR Angiogram Follow Up Study  Result Date: 11/19/2020 PROCEDURE: DIAGNOSTIC CEREBRAL ANGIOGRAM COIL EMBOLIZATION OF BASILAR ANEURYSM HISTORY: The patient is a 60 year old woman presenting to the emergency department with several episodes of diplopia and approximally 1 week of significantly worsening headache. 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 She was therefore admitted to the hospital. After review of CTA it was felt that she would likely be a candidate  for stent supported coiling. It was felt that her aneurysm was likely the cause of her new symptoms likely due to enlargement and irritation of the oculomotor nerve. Patient was loaded with 300 mg of Plavix and 325 mg of aspirin. ACCESS: The  technical aspects of the procedure as well as its potential risks and benefits were reviewed with the patient and patient's family. These risks included but were not limited to stroke, intracranial hemorrhage, bleeding, infection, allergic reaction, damage to organs or vital structures, stroke, non-diagnostic procedure, and the catastrophic outcomes of heart attack, coma, and death. With an understanding of these risks, informed consent was obtained and witnessed. The patient was placed in the supine position on the angiography table and the skin of right groin prepped in the usual sterile fashion. The procedure was performed under general anesthesia. A 5-French sheath was introduced in the right common femoral artery using Seldinger technique. MEDICATIONS: HEPARIN: 8000 Units total. INTEGRILIN: 16mg  IA, 12mcg/kg/min IV continuous infusion CONTRAST:  61mL OMNIPAQUE IOHEXOL 300 MG/ML SOLN, 74mL OMNIPAQUE IOHEXOL 300 MG/ML SOLN, 83mL OMNIPAQUE IOHEXOL 300 MG/ML SOLNcc, Omnipaque 300 FLUOROSCOPY TIME:  FLUOROSCOPY TIME: See IR records TECHNIQUE: CATHETERS AND WIRES 5-French JB-1 catheter 180 cm 0.035" glidewire 6-French NeuronMax guide sheath 6-French Berenstein Select JB-1 catheter 0.070 Sofia guide catheter 0.058" CatV guidecatheter Synchro select standard microwire Synchro select soft microwire As[employer] Chikai 10 microwire Excelsior XT-17 microcatheter x 2 COILS/STENTS USED Target 3D 6 mm x 15 cm Target 360 XL soft 5 mm by 10 cm Target 360 XL 4 mm x 12 cm Target 360 XL 3 mm x 9 cm not deployed Target 360 ultra 2 mm x 4 cm Target 360 ultra 2.5 mm x 4 cm x2 Target 360 ultra 2 mm x 4 cm x2 Neuroform Atlas 3.0 mm x 24 mm VESSELS CATHETERIZED Right internal carotid Left internal carotid Left vertebral Right vertebral Right common femoral Basilar artery Right posterior cerebral artery VESSELS STUDIED Right internal carotid, head Left internal carotid, head Left vertebral Right vertebral Right vertebral artery, head (during  embolization) Right vertebral artery, head (post-coiling) Left vertebral artery, head (pre stent) Left vertebral artery, head (immediate post stent) Left vertebral artery, head (during coiling) Left vertebral artery, head (immediate post coiling) Left vertebral artery, head (post thrombolysis) PROCEDURAL NARRATIVE A 5-Fr JB-1 glide catheter was advanced over a 0.035 glidewire into the aortic arch. The above vessels were then sequentially catheterized and cervical / cerebral angiograms taken. After review of images, the catheter was removed without incident. The 5-Fr sheath was then exchanged over the glidewire for an 8-Fr sheath. Under real-time fluoroscopy, the guide sheath was advanced over the select catheter and glidewire into the descending aorta. 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 The guide sheath was then advanced into the proximal right vertebral artery. The Select catheter was removed and the 070 guide catheter was coaxially introduced over both the XT-17 micro catheters and synchro select micro wires. The microcatheters were then advanced under roadmap guidance into the distal cervical vertebral artery. The guide catheter was then tracked over the microcatheter into the distal vertebral artery. The micro catheters were then further advanced into the proximal basilar artery. This allowed advancement of the guide catheter into the proximal basilar artery. Initially, multiple attempts were made to access the right posterior cerebral artery unsuccessfully. This included a combination of different shape microcatheter tip. I then attempted access with the Chikai 10 microwire  again with multiple different tip configurations all unsuccessfully. At this point I elected to attempt to place coils within the aneurysm without the aid of the stent as it did not appear access to the right posterior cerebral artery  would be feasible. The three-dimensional coil as well as several filling coils were placed into the aneurysm. There is a small amount of coil loop extending down near the origin of the right posterior cerebral artery. As the last coil was being placed, some coil loops appear to be extending down further to words the origin of the posterior cerebral arteries and I therefore elected to stop coiling at this point. Angiogram was taken and the micro catheters were withdrawn down into the guide catheter. Guide catheter was then withdrawn down into the cervical vertebral artery and angiogram was taken. This did reveal decreased flow through the right posterior cerebral artery suggesting some compromise of the ostium possibly due to coil mass. I therefore elected to attempt to place a stent by accessing the left vertebral artery. The guide catheter was removed, and the guide sheath was withdrawn down into the descending aorta as was the guide sheath. The select catheter was introduced over the Glidewire, and the left subclavian followed by the proximal left vertebral artery were selected. Guide sheath was then advanced into the proximal left vertebral artery. Under roadmap guidance, the 058 CT 5 guide catheter was introduced over and XT 17 microcatheter and synchro select microwire. These were then navigated until the CAT 5 guide catheter was in the proximal basilar artery. The synchro select microwire was then used to access the right posterior cerebral artery. I was able to gain access to this vessel by buttressing against the coil mass already placed in the aneurysm. Microcatheter was then tracked into the P2 segment. Microwire was removed, and the above stent was introduced. Stent was then deployed under roadmap guidance without incident. The microcatheter was then advanced over the pushing wire into the lumen of the stent. Angiogram was then taken. It did appear that flow through the right posterior cerebral artery was  improved in comparison to the prior angiogram. With the posterior cerebral artery somewhat protected, we elected to attempt to coil more of the residual aneurysm. The microwire was introduced into the microcatheter, and advanced into the lumen of the aneurysm through the stent. Microcatheter was then advanced into the residual aneurysm. The remainder of the above coils were then sequentially placed. After several coils were placed, I elected to complete the coiling procedure as I did not want to risk coil herniation through the tines of the stent and further compromise the posterior cerebral arteries. The coiling catheter was withdrawn, and the guide catheter was withdrawn down into the cervical vertebral artery. At this point, angiogram was taken which appear to reveal more decreased flow in the right posterior cerebral artery and what appear to be filling defect in the inferior wall of the right posterior cerebral artery adjacent to the coil mass. Therefore under roadmap guidance, I advanced the microcatheter over the microwire back into the lumen of the stent. I then administered a 8 mg bolus dose of Integrilin intra-arterially. After approximately 10 minutes, angiogram was taken which did reveal somewhat increased flow through the right posterior cerebral artery and better perfusion. I then administered another 8 mg bolus dose intra-arterially. After another 10 minutes, another angiogram was taken again revealing improved flow through the right posterior cerebral artery. At this point, the pharmacy was able to deliver intravenous Integrilin  which was started at a rate of 2 micrograms/kg/minute with the plan to continue for 18 hours. With improved flow in the right posterior cerebral artery, I elected to complete the procedure at this point. The guide catheter and guide sheath were then synchronously removed without incident. FINDINGS: Right internal carotid, head: Injection reveals the presence of a widely  patent ICA, M1, and A1 segments and their branches. No aneurysms, AVMs, or high-flow fistulas are seen. Of note, the posterior communicating artery is very small and no visualization of the posterior circulation is noted. The parenchymal and venous phases are normal. The venous sinuses are widely patent. Left internal carotid, head: Injection reveals the presence of a widely patent ICA, A1, and M1 segments and their branches. No aneurysms, AVMs, or high-flow fistulas are seen. Similar to the contralateral side, there is no significant visualization of the left posterior communicating artery. The parenchymal and venous phases are normal. The venous sinuses are widely patent. Left vertebral: Injection reveals the presence of a widely patent vertebral artery. This leads to a widely patent basilar artery that terminates in bilateral P1. 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 This aneurysm measures approximately 6.8 x 5.8 mm with a neck measuring approximately 4.5 mm. The parenchymal and venous phases are normal. The venous sinuses are widely patent. Right vertebral: The vertebral artery is widely patent. No PICA aneurysm is seen. See basilar description above. Right vertebral artery, head (during embolization) Angiograms taken during coiling of the basilar aneurysm through the right vertebral artery access demonstrate progressive occlusion primarily of the dome of the aneurysm. The right vertebral artery and bilateral posterior cerebral arteries appear to be widely patent. There is residual aneurysm both at the neck and centrally within the aneurysm. No filling defects are noted. Right vertebral artery, head (post coiling) Angiogram taken after coiling through the right vertebral artery reveal occlusion of the dome of the aneurysm although there is residual aneurysm centrally and at the neck. There are small  coil loops which appear to be in proximity to the origin of the right posterior cerebral artery. No filling defects are seen however flow through the right posterior cerebral artery is delayed in comparison to the left PCA, as well as in comparison to earlier angiograms suggesting flow limitation. Left vertebral artery, head (pre stent) Angiogram taken through the guide catheter in the left vertebral artery again demonstrates coil mass within the aneurysm with the residual filling as described above. Again seen is delayed filling of the right posterior cerebral artery with decreased perfusion in comparison to prior angiograms. Again, no filling defects are seen to suggest thrombus. Left vertebral artery, head (immediate post stent) Angiogram reveals widely patent left vertebral artery with stent extending from the mid basilar into the right P2 segment. No filling defects are seen. Flow within the right posterior cerebral artery appears to be improved in comparison to the pre stent angiogram with more symmetric filling of the bilateral posterior cerebral arteries. Left vertebral artery, head (during coiling) Angiogram reveals further coil mass within the residual aneurysm which is progressively occluded. No filling defects are noted. The posterior cerebral arteries remain patent. Left vertebral artery, head (immediate post coiling) Angiogram taken from the left vertebral artery reveals patent basilar artery. The left posterior cerebral artery remains widely patent. Stent is in stable position. There is coil mass within the aneurysm, with minimal neck residual. There is decreased flow  through the right posterior cerebral artery with filling defect in the inferior wall of the right P1 adjacent to the distal aneurysm neck likely representing thrombus. There is decreased perfusion of the right posterior cerebral artery territory in comparison to the prior angiograms. Left vertebral artery, head (post thrombolysis)  Angiograms taken 10 minutes after initial 8 mg bolus dose and taken again 10 minutes after the second 8 mg bolus dose sequentially reveal improvement in flow through the right posterior cerebral artery. There is also much smaller filling defect along the wall of the posterior cerebral artery adjacent to the coil mass indicating dissolution of some of the thrombus. The final angiogram does show minimally decreased flow through the right posterior cerebral artery with significantly improved perfusion of the right PCA territory in comparison to the previous angiograms. Venous sinuses remain widely patent. DISPOSITION: Upon completion of the study, the femoral sheath was secured in place with nylon suture and connected to a pressure system. The patient was extubated and transferred to the postanesthesia care unit in stable hemodynamic condition. IMPRESSION: 1. 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 Procedure was complicated by stent thrombosis treated with intra arterial and intravenous Integrilin. The preliminary results of this procedure were shared with the patient's family. Electronically Signed   By: Consuella Lose   On: 11/19/2020 20:28   IR Angiogram Follow Up Study  Result Date: 11/19/2020 PROCEDURE: DIAGNOSTIC CEREBRAL ANGIOGRAM COIL EMBOLIZATION OF BASILAR ANEURYSM HISTORY: The patient is a 60 year old woman presenting to the emergency department with several episodes of diplopia and approximally 1 week of significantly worsening headache. 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 She was therefore admitted to the hospital. After review of CTA it was felt that she would likely be a candidate for stent supported coiling. It was felt that her aneurysm was likely the cause of her new symptoms likely due to enlargement and irritation of  the oculomotor nerve. Patient was loaded with 300 mg of Plavix and 325 mg of aspirin. ACCESS: The technical aspects of the procedure as well as its potential risks and benefits were reviewed with the patient and patient's family. These risks included but were not limited to stroke, intracranial hemorrhage, bleeding, infection, allergic reaction, damage to organs or vital structures, stroke, non-diagnostic procedure, and the catastrophic outcomes of heart attack, coma, and death. With an understanding of these risks, informed consent was obtained and witnessed. The patient was placed in the supine position on the angiography table and the skin of right groin prepped in the usual sterile fashion. The procedure was performed under general anesthesia. A 5-French sheath was introduced in the right common femoral artery using Seldinger technique. MEDICATIONS: HEPARIN: 8000 Units total. INTEGRILIN: 16mg  IA, 46mcg/kg/min IV continuous infusion CONTRAST:  77mL OMNIPAQUE IOHEXOL 300 MG/ML SOLN, 79mL OMNIPAQUE IOHEXOL 300 MG/ML SOLN, 50mL OMNIPAQUE IOHEXOL 300 MG/ML SOLNcc, Omnipaque 300 FLUOROSCOPY TIME:  FLUOROSCOPY TIME: See IR records TECHNIQUE: CATHETERS AND WIRES 5-French JB-1 catheter 180 cm 0.035" glidewire 6-French NeuronMax guide sheath 6-French Berenstein Select JB-1 catheter 0.070 Sofia guide catheter 0.058" CatV guidecatheter Synchro select standard microwire Synchro select soft microwire Asahi Chikai 10 microwire Excelsior XT-17 microcatheter x 2 COILS/STENTS USED Target 3D 6 mm x 15 cm Target 360 XL soft 5 mm by 10 cm Target 360 XL 4 mm x 12 cm Target 360 XL 3 mm x 9 cm not deployed  Target 360 ultra 2 mm x 4 cm Target 360 ultra 2.5 mm x 4 cm x2 Target 360 ultra 2 mm x 4 cm x2 Neuroform Atlas 3.0 mm x 24 mm VESSELS CATHETERIZED Right internal carotid Left internal carotid Left vertebral Right vertebral Right common femoral Basilar artery Right posterior cerebral artery VESSELS STUDIED Right internal carotid, head  Left internal carotid, head Left vertebral Right vertebral Right vertebral artery, head (during embolization) Right vertebral artery, head (post-coiling) Left vertebral artery, head (pre stent) Left vertebral artery, head (immediate post stent) Left vertebral artery, head (during coiling) Left vertebral artery, head (immediate post coiling) Left vertebral artery, head (post thrombolysis) PROCEDURAL NARRATIVE A 5-Fr JB-1 glide catheter was advanced over a 0.035 glidewire into the aortic arch. The above vessels were then sequentially catheterized and cervical / cerebral angiograms taken. After review of images, the catheter was removed without incident. The 5-Fr sheath was then exchanged over the glidewire for an 8-Fr sheath. Under real-time fluoroscopy, the guide sheath was advanced over the select catheter and glidewire into the descending aorta. 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 The guide sheath was then advanced into the proximal right vertebral artery. The Select catheter was removed and the 070 guide catheter was coaxially introduced over both the XT-17 micro catheters and synchro select micro wires. The microcatheters were then advanced under roadmap guidance into the distal cervical vertebral artery. The guide catheter was then tracked over the microcatheter into the distal vertebral artery. The micro catheters were then further advanced into the proximal basilar artery. This allowed advancement of the guide catheter into the proximal basilar artery. Initially, multiple attempts were made to access the right posterior cerebral artery unsuccessfully. This included a combination of different shape microcatheter tip. I then attempted access with the Chikai 10 microwire again with multiple different tip configurations all unsuccessfully. At this point I elected to attempt to place coils within the aneurysm  without the aid of the stent as it did not appear access to the right posterior cerebral artery would be feasible. The three-dimensional coil as well as several filling coils were placed into the aneurysm. There is a small amount of coil loop extending down near the origin of the right posterior cerebral artery. As the last coil was being placed, some coil loops appear to be extending down further to words the origin of the posterior cerebral arteries and I therefore elected to stop coiling at this point. Angiogram was taken and the micro catheters were withdrawn down into the guide catheter. Guide catheter was then withdrawn down into the cervical vertebral artery and angiogram was taken. This did reveal decreased flow through the right posterior cerebral artery suggesting some compromise of the ostium possibly due to coil mass. I therefore elected to attempt to place a stent by accessing the left vertebral artery. The guide catheter was removed, and the guide sheath was withdrawn down into the descending aorta as was the guide sheath. The select catheter was introduced over the Glidewire, and the left subclavian followed by the proximal left vertebral artery were selected. Guide sheath was then advanced into the proximal left vertebral artery. Under roadmap guidance, the 058 CT 5 guide catheter was introduced over and XT 17 microcatheter and synchro select microwire. These were then navigated until the CAT 5 guide catheter was in the proximal basilar artery. The synchro select microwire was then used to access the right posterior  cerebral artery. I was able to gain access to this vessel by buttressing against the coil mass already placed in the aneurysm. Microcatheter was then tracked into the P2 segment. Microwire was removed, and the above stent was introduced. Stent was then deployed under roadmap guidance without incident. The microcatheter was then advanced over the pushing wire into the lumen of the stent.  Angiogram was then taken. It did appear that flow through the right posterior cerebral artery was improved in comparison to the prior angiogram. With the posterior cerebral artery somewhat protected, we elected to attempt to coil more of the residual aneurysm. The microwire was introduced into the microcatheter, and advanced into the lumen of the aneurysm through the stent. Microcatheter was then advanced into the residual aneurysm. The remainder of the above coils were then sequentially placed. After several coils were placed, I elected to complete the coiling procedure as I did not want to risk coil herniation through the tines of the stent and further compromise the posterior cerebral arteries. The coiling catheter was withdrawn, and the guide catheter was withdrawn down into the cervical vertebral artery. At this point, angiogram was taken which appear to reveal more decreased flow in the right posterior cerebral artery and what appear to be filling defect in the inferior wall of the right posterior cerebral artery adjacent to the coil mass. Therefore under roadmap guidance, I advanced the microcatheter over the microwire back into the lumen of the stent. I then administered a 8 mg bolus dose of Integrilin intra-arterially. After approximately 10 minutes, angiogram was taken which did reveal somewhat increased flow through the right posterior cerebral artery and better perfusion. I then administered another 8 mg bolus dose intra-arterially. After another 10 minutes, another angiogram was taken again revealing improved flow through the right posterior cerebral artery. At this point, the pharmacy was able to deliver intravenous Integrilin which was started at a rate of 2 micrograms/kg/minute with the plan to continue for 18 hours. With improved flow in the right posterior cerebral artery, I elected to complete the procedure at this point. The guide catheter and guide sheath were then synchronously removed without  incident. FINDINGS: Right internal carotid, head: Injection reveals the presence of a widely patent ICA, M1, and A1 segments and their branches. No aneurysms, AVMs, or high-flow fistulas are seen. Of note, the posterior communicating artery is very small and no visualization of the posterior circulation is noted. The parenchymal and venous phases are normal. The venous sinuses are widely patent. Left internal carotid, head: Injection reveals the presence of a widely patent ICA, A1, and M1 segments and their branches. No aneurysms, AVMs, or high-flow fistulas are seen. Similar to the contralateral side, there is no significant visualization of the left posterior communicating artery. The parenchymal and venous phases are normal. The venous sinuses are widely patent. Left vertebral: Injection reveals the presence of a widely patent vertebral artery. This leads to a widely patent basilar artery that terminates in bilateral P1. 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 This aneurysm measures approximately 6.8 x 5.8 mm with a neck measuring approximately 4.5 mm. The parenchymal and venous phases are normal. The venous sinuses are widely patent. Right vertebral: The vertebral artery is widely patent. No PICA aneurysm is seen. See basilar description above. Right vertebral artery, head (during embolization) Angiograms taken during coiling of the basilar aneurysm through the right vertebral artery access  demonstrate progressive occlusion primarily of the dome of the aneurysm. The right vertebral artery and bilateral posterior cerebral arteries appear to be widely patent. There is residual aneurysm both at the neck and centrally within the aneurysm. No filling defects are noted. Right vertebral artery, head (post coiling) Angiogram taken after coiling through the right vertebral artery reveal occlusion of the dome of  the aneurysm although there is residual aneurysm centrally and at the neck. There are small coil loops which appear to be in proximity to the origin of the right posterior cerebral artery. No filling defects are seen however flow through the right posterior cerebral artery is delayed in comparison to the left PCA, as well as in comparison to earlier angiograms suggesting flow limitation. Left vertebral artery, head (pre stent) Angiogram taken through the guide catheter in the left vertebral artery again demonstrates coil mass within the aneurysm with the residual filling as described above. Again seen is delayed filling of the right posterior cerebral artery with decreased perfusion in comparison to prior angiograms. Again, no filling defects are seen to suggest thrombus. Left vertebral artery, head (immediate post stent) Angiogram reveals widely patent left vertebral artery with stent extending from the mid basilar into the right P2 segment. No filling defects are seen. Flow within the right posterior cerebral artery appears to be improved in comparison to the pre stent angiogram with more symmetric filling of the bilateral posterior cerebral arteries. Left vertebral artery, head (during coiling) Angiogram reveals further coil mass within the residual aneurysm which is progressively occluded. No filling defects are noted. The posterior cerebral arteries remain patent. Left vertebral artery, head (immediate post coiling) Angiogram taken from the left vertebral artery reveals patent basilar artery. The left posterior cerebral artery remains widely patent. Stent is in stable position. There is coil mass within the aneurysm, with minimal neck residual. There is decreased flow through the right posterior cerebral artery with filling defect in the inferior wall of the right P1 adjacent to the distal aneurysm neck likely representing thrombus. There is decreased perfusion of the right posterior cerebral artery territory  in comparison to the prior angiograms. Left vertebral artery, head (post thrombolysis) Angiograms taken 10 minutes after initial 8 mg bolus dose and taken again 10 minutes after the second 8 mg bolus dose sequentially reveal improvement in flow through the right posterior cerebral artery. There is also much smaller filling defect along the wall of the posterior cerebral artery adjacent to the coil mass indicating dissolution of some of the thrombus. The final angiogram does show minimally decreased flow through the right posterior cerebral artery with significantly improved perfusion of the right PCA territory in comparison to the previous angiograms. Venous sinuses remain widely patent. DISPOSITION: Upon completion of the study, the femoral sheath was secured in place with nylon suture and connected to a pressure system. The patient was extubated and transferred to the postanesthesia care unit in stable hemodynamic condition. IMPRESSION: 1. 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 Procedure was complicated by stent thrombosis treated with intra arterial and intravenous Integrilin. The preliminary results of this procedure were shared with the patient's family. Electronically Signed   By: Consuella Lose   On: 11/19/2020 20:28   IR Angiogram Follow Up Study  Result Date: 11/19/2020 PROCEDURE: DIAGNOSTIC CEREBRAL ANGIOGRAM COIL EMBOLIZATION OF BASILAR ANEURYSM HISTORY: The patient is a 60 year old woman presenting to the emergency department with several episodes of diplopia and approximally 1 week  of significantly worsening headache. 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 She was therefore admitted to the hospital. After review of CTA it was felt that she would likely be a candidate for stent supported coiling. It was felt that her aneurysm  was likely the cause of her new symptoms likely due to enlargement and irritation of the oculomotor nerve. Patient was loaded with 300 mg of Plavix and 325 mg of aspirin. ACCESS: The technical aspects of the procedure as well as its potential risks and benefits were reviewed with the patient and patient's family. These risks included but were not limited to stroke, intracranial hemorrhage, bleeding, infection, allergic reaction, damage to organs or vital structures, stroke, non-diagnostic procedure, and the catastrophic outcomes of heart attack, coma, and death. With an understanding of these risks, informed consent was obtained and witnessed. The patient was placed in the supine position on the angiography table and the skin of right groin prepped in the usual sterile fashion. The procedure was performed under general anesthesia. A 5-French sheath was introduced in the right common femoral artery using Seldinger technique. MEDICATIONS: HEPARIN: 8000 Units total. INTEGRILIN: 16mg  IA, 8mcg/kg/min IV continuous infusion CONTRAST:  62mL OMNIPAQUE IOHEXOL 300 MG/ML SOLN, 46mL OMNIPAQUE IOHEXOL 300 MG/ML SOLN, 34mL OMNIPAQUE IOHEXOL 300 MG/ML SOLNcc, Omnipaque 300 FLUOROSCOPY TIME:  FLUOROSCOPY TIME: See IR records TECHNIQUE: CATHETERS AND WIRES 5-French JB-1 catheter 180 cm 0.035" glidewire 6-French NeuronMax guide sheath 6-French Berenstein Select JB-1 catheter 0.070 Sofia guide catheter 0.058" CatV guidecatheter Synchro select standard microwire Synchro select soft microwire As[employer] Chikai 10 microwire Excelsior XT-17 microcatheter x 2 COILS/STENTS USED Target 3D 6 mm x 15 cm Target 360 XL soft 5 mm by 10 cm Target 360 XL 4 mm x 12 cm Target 360 XL 3 mm x 9 cm not deployed Target 360 ultra 2 mm x 4 cm Target 360 ultra 2.5 mm x 4 cm x2 Target 360 ultra 2 mm x 4 cm x2 Neuroform Atlas 3.0 mm x 24 mm VESSELS CATHETERIZED Right internal carotid Left internal carotid Left vertebral Right vertebral Right common femoral Basilar  artery Right posterior cerebral artery VESSELS STUDIED Right internal carotid, head Left internal carotid, head Left vertebral Right vertebral Right vertebral artery, head (during embolization) Right vertebral artery, head (post-coiling) Left vertebral artery, head (pre stent) Left vertebral artery, head (immediate post stent) Left vertebral artery, head (during coiling) Left vertebral artery, head (immediate post coiling) Left vertebral artery, head (post thrombolysis) PROCEDURAL NARRATIVE A 5-Fr JB-1 glide catheter was advanced over a 0.035 glidewire into the aortic arch. The above vessels were then sequentially catheterized and cervical / cerebral angiograms taken. After review of images, the catheter was removed without incident. The 5-Fr sheath was then exchanged over the glidewire for an 8-Fr sheath. Under real-time fluoroscopy, the guide sheath was advanced over the select catheter and glidewire into the descending aorta. 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 The guide sheath was then advanced into the proximal right vertebral artery. The Select catheter was removed and the 070 guide catheter was coaxially introduced over both the XT-17 micro catheters and synchro select micro wires. The microcatheters were then advanced under roadmap guidance into the distal cervical vertebral artery. The guide catheter was then tracked over the microcatheter into the distal vertebral artery. The micro catheters were then further advanced into the proximal basilar  artery. This allowed advancement of the guide catheter into the proximal basilar artery. Initially, multiple attempts were made to access the right posterior cerebral artery unsuccessfully. This included a combination of different shape microcatheter tip. I then attempted access with the Chikai 10 microwire again with multiple different tip configurations all  unsuccessfully. At this point I elected to attempt to place coils within the aneurysm without the aid of the stent as it did not appear access to the right posterior cerebral artery would be feasible. The three-dimensional coil as well as several filling coils were placed into the aneurysm. There is a small amount of coil loop extending down near the origin of the right posterior cerebral artery. As the last coil was being placed, some coil loops appear to be extending down further to words the origin of the posterior cerebral arteries and I therefore elected to stop coiling at this point. Angiogram was taken and the micro catheters were withdrawn down into the guide catheter. Guide catheter was then withdrawn down into the cervical vertebral artery and angiogram was taken. This did reveal decreased flow through the right posterior cerebral artery suggesting some compromise of the ostium possibly due to coil mass. I therefore elected to attempt to place a stent by accessing the left vertebral artery. The guide catheter was removed, and the guide sheath was withdrawn down into the descending aorta as was the guide sheath. The select catheter was introduced over the Glidewire, and the left subclavian followed by the proximal left vertebral artery were selected. Guide sheath was then advanced into the proximal left vertebral artery. Under roadmap guidance, the 058 CT 5 guide catheter was introduced over and XT 17 microcatheter and synchro select microwire. These were then navigated until the CAT 5 guide catheter was in the proximal basilar artery. The synchro select microwire was then used to access the right posterior cerebral artery. I was able to gain access to this vessel by buttressing against the coil mass already placed in the aneurysm. Microcatheter was then tracked into the P2 segment. Microwire was removed, and the above stent was introduced. Stent was then deployed under roadmap guidance without incident.  The microcatheter was then advanced over the pushing wire into the lumen of the stent. Angiogram was then taken. It did appear that flow through the right posterior cerebral artery was improved in comparison to the prior angiogram. With the posterior cerebral artery somewhat protected, we elected to attempt to coil more of the residual aneurysm. The microwire was introduced into the microcatheter, and advanced into the lumen of the aneurysm through the stent. Microcatheter was then advanced into the residual aneurysm. The remainder of the above coils were then sequentially placed. After several coils were placed, I elected to complete the coiling procedure as I did not want to risk coil herniation through the tines of the stent and further compromise the posterior cerebral arteries. The coiling catheter was withdrawn, and the guide catheter was withdrawn down into the cervical vertebral artery. At this point, angiogram was taken which appear to reveal more decreased flow in the right posterior cerebral artery and what appear to be filling defect in the inferior wall of the right posterior cerebral artery adjacent to the coil mass. Therefore under roadmap guidance, I advanced the microcatheter over the microwire back into the lumen of the stent. I then administered a 8 mg bolus dose of Integrilin intra-arterially. After approximately 10 minutes, angiogram was taken which did reveal somewhat increased flow through the right  posterior cerebral artery and better perfusion. I then administered another 8 mg bolus dose intra-arterially. After another 10 minutes, another angiogram was taken again revealing improved flow through the right posterior cerebral artery. At this point, the pharmacy was able to deliver intravenous Integrilin which was started at a rate of 2 micrograms/kg/minute with the plan to continue for 18 hours. With improved flow in the right posterior cerebral artery, I elected to complete the procedure at  this point. The guide catheter and guide sheath were then synchronously removed without incident. FINDINGS: Right internal carotid, head: Injection reveals the presence of a widely patent ICA, M1, and A1 segments and their branches. No aneurysms, AVMs, or high-flow fistulas are seen. Of note, the posterior communicating artery is very small and no visualization of the posterior circulation is noted. The parenchymal and venous phases are normal. The venous sinuses are widely patent. Left internal carotid, head: Injection reveals the presence of a widely patent ICA, A1, and M1 segments and their branches. No aneurysms, AVMs, or high-flow fistulas are seen. Similar to the contralateral side, there is no significant visualization of the left posterior communicating artery. The parenchymal and venous phases are normal. The venous sinuses are widely patent. Left vertebral: Injection reveals the presence of a widely patent vertebral artery. This leads to a widely patent basilar artery that terminates in bilateral P1. 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 This aneurysm measures approximately 6.8 x 5.8 mm with a neck measuring approximately 4.5 mm. The parenchymal and venous phases are normal. The venous sinuses are widely patent. Right vertebral: The vertebral artery is widely patent. No PICA aneurysm is seen. See basilar description above. Right vertebral artery, head (during embolization) Angiograms taken during coiling of the basilar aneurysm through the right vertebral artery access demonstrate progressive occlusion primarily of the dome of the aneurysm. The right vertebral artery and bilateral posterior cerebral arteries appear to be widely patent. There is residual aneurysm both at the neck and centrally within the aneurysm. No filling defects are noted. Right vertebral artery, head (post coiling) Angiogram  taken after coiling through the right vertebral artery reveal occlusion of the dome of the aneurysm although there is residual aneurysm centrally and at the neck. There are small coil loops which appear to be in proximity to the origin of the right posterior cerebral artery. No filling defects are seen however flow through the right posterior cerebral artery is delayed in comparison to the left PCA, as well as in comparison to earlier angiograms suggesting flow limitation. Left vertebral artery, head (pre stent) Angiogram taken through the guide catheter in the left vertebral artery again demonstrates coil mass within the aneurysm with the residual filling as described above. Again seen is delayed filling of the right posterior cerebral artery with decreased perfusion in comparison to prior angiograms. Again, no filling defects are seen to suggest thrombus. Left vertebral artery, head (immediate post stent) Angiogram reveals widely patent left vertebral artery with stent extending from the mid basilar into the right P2 segment. No filling defects are seen. Flow within the right posterior cerebral artery appears to be improved in comparison to the pre stent angiogram with more symmetric filling of the bilateral posterior cerebral arteries. Left vertebral artery, head (during coiling) Angiogram reveals further coil mass within the residual aneurysm which is progressively occluded. No filling defects are noted. The posterior cerebral arteries remain patent. Left vertebral  artery, head (immediate post coiling) Angiogram taken from the left vertebral artery reveals patent basilar artery. The left posterior cerebral artery remains widely patent. Stent is in stable position. There is coil mass within the aneurysm, with minimal neck residual. There is decreased flow through the right posterior cerebral artery with filling defect in the inferior wall of the right P1 adjacent to the distal aneurysm neck likely representing  thrombus. There is decreased perfusion of the right posterior cerebral artery territory in comparison to the prior angiograms. Left vertebral artery, head (post thrombolysis) Angiograms taken 10 minutes after initial 8 mg bolus dose and taken again 10 minutes after the second 8 mg bolus dose sequentially reveal improvement in flow through the right posterior cerebral artery. There is also much smaller filling defect along the wall of the posterior cerebral artery adjacent to the coil mass indicating dissolution of some of the thrombus. The final angiogram does show minimally decreased flow through the right posterior cerebral artery with significantly improved perfusion of the right PCA territory in comparison to the previous angiograms. Venous sinuses remain widely patent. DISPOSITION: Upon completion of the study, the femoral sheath was secured in place with nylon suture and connected to a pressure system. The patient was extubated and transferred to the postanesthesia care unit in stable hemodynamic condition. IMPRESSION: 1. 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 Procedure was complicated by stent thrombosis treated with intra arterial and intravenous Integrilin. The preliminary results of this procedure were shared with the patient's family. Electronically Signed   By: Consuella Lose   On: 11/19/2020 20:28   IR Angiogram Follow Up Study  Result Date: 11/19/2020 PROCEDURE: DIAGNOSTIC CEREBRAL ANGIOGRAM COIL EMBOLIZATION OF BASILAR ANEURYSM HISTORY: The patient is a 60 year old woman presenting to the emergency department with several episodes of diplopia and approximally 1 week of significantly worsening headache. 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 She was therefore admitted to the hospital. After review of CTA it was felt that  she would likely be a candidate for stent supported coiling. It was felt that her aneurysm was likely the cause of her new symptoms likely due to enlargement and irritation of the oculomotor nerve. Patient was loaded with 300 mg of Plavix and 325 mg of aspirin. ACCESS: The technical aspects of the procedure as well as its potential risks and benefits were reviewed with the patient and patient's family. These risks included but were not limited to stroke, intracranial hemorrhage, bleeding, infection, allergic reaction, damage to organs or vital structures, stroke, non-diagnostic procedure, and the catastrophic outcomes of heart attack, coma, and death. With an understanding of these risks, informed consent was obtained and witnessed. The patient was placed in the supine position on the angiography table and the skin of right groin prepped in the usual sterile fashion. The procedure was performed under general anesthesia. A 5-French sheath was introduced in the right common femoral artery using Seldinger technique. MEDICATIONS: HEPARIN: 8000 Units total. INTEGRILIN: 16mg  IA, 55mcg/kg/min IV continuous infusion CONTRAST:  65mL OMNIPAQUE IOHEXOL 300 MG/ML SOLN, 3mL OMNIPAQUE IOHEXOL 300 MG/ML SOLN, 47mL OMNIPAQUE IOHEXOL 300 MG/ML SOLNcc, Omnipaque 300 FLUOROSCOPY TIME:  FLUOROSCOPY TIME: See IR records TECHNIQUE: CATHETERS AND WIRES 5-French JB-1 catheter 180 cm 0.035" glidewire 6-French NeuronMax guide sheath 6-French Berenstein Select JB-1 catheter 0.070 Sofia guide catheter 0.058" CatV guidecatheter Synchro select standard microwire Synchro select soft microwire [employer]  Chikai 10 microwire Excelsior XT-17 microcatheter x 2 COILS/STENTS USED Target 3D 6 mm x 15 cm Target 360 XL soft 5 mm by 10 cm Target 360 XL 4 mm x 12 cm Target 360 XL 3 mm x 9 cm not deployed Target 360 ultra 2 mm x 4 cm Target 360 ultra 2.5 mm x 4 cm x2 Target 360 ultra 2 mm x 4 cm x2 Neuroform Atlas 3.0 mm x 24 mm VESSELS CATHETERIZED Right  internal carotid Left internal carotid Left vertebral Right vertebral Right common femoral Basilar artery Right posterior cerebral artery VESSELS STUDIED Right internal carotid, head Left internal carotid, head Left vertebral Right vertebral Right vertebral artery, head (during embolization) Right vertebral artery, head (post-coiling) Left vertebral artery, head (pre stent) Left vertebral artery, head (immediate post stent) Left vertebral artery, head (during coiling) Left vertebral artery, head (immediate post coiling) Left vertebral artery, head (post thrombolysis) PROCEDURAL NARRATIVE A 5-Fr JB-1 glide catheter was advanced over a 0.035 glidewire into the aortic arch. The above vessels were then sequentially catheterized and cervical / cerebral angiograms taken. After review of images, the catheter was removed without incident. The 5-Fr sheath was then exchanged over the glidewire for an 8-Fr sheath. Under real-time fluoroscopy, the guide sheath was advanced over the select catheter and glidewire into the descending aorta. 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 The guide sheath was then advanced into the proximal right vertebral artery. The Select catheter was removed and the 070 guide catheter was coaxially introduced over both the XT-17 micro catheters and synchro select micro wires. The microcatheters were then advanced under roadmap guidance into the distal cervical vertebral artery. The guide catheter was then tracked over the microcatheter into the distal vertebral artery. The micro catheters were then further advanced into the proximal basilar artery. This allowed advancement of the guide catheter into the proximal basilar artery. Initially, multiple attempts were made to access the right posterior cerebral artery unsuccessfully. This included a combination of different shape microcatheter tip. I then  attempted access with the Chikai 10 microwire again with multiple different tip configurations all unsuccessfully. At this point I elected to attempt to place coils within the aneurysm without the aid of the stent as it did not appear access to the right posterior cerebral artery would be feasible. The three-dimensional coil as well as several filling coils were placed into the aneurysm. There is a small amount of coil loop extending down near the origin of the right posterior cerebral artery. As the last coil was being placed, some coil loops appear to be extending down further to words the origin of the posterior cerebral arteries and I therefore elected to stop coiling at this point. Angiogram was taken and the micro catheters were withdrawn down into the guide catheter. Guide catheter was then withdrawn down into the cervical vertebral artery and angiogram was taken. This did reveal decreased flow through the right posterior cerebral artery suggesting some compromise of the ostium possibly due to coil mass. I therefore elected to attempt to place a stent by accessing the left vertebral artery. The guide catheter was removed, and the guide sheath was withdrawn down into the descending aorta as was the guide sheath. The select catheter was introduced over the Glidewire, and the left subclavian followed by the proximal left vertebral artery were selected. Guide sheath was then advanced into the proximal left vertebral artery. Under roadmap guidance, the  058 CT 5 guide catheter was introduced over and XT 17 microcatheter and synchro select microwire. These were then navigated until the CAT 5 guide catheter was in the proximal basilar artery. The synchro select microwire was then used to access the right posterior cerebral artery. I was able to gain access to this vessel by buttressing against the coil mass already placed in the aneurysm. Microcatheter was then tracked into the P2 segment. Microwire was removed, and  the above stent was introduced. Stent was then deployed under roadmap guidance without incident. The microcatheter was then advanced over the pushing wire into the lumen of the stent. Angiogram was then taken. It did appear that flow through the right posterior cerebral artery was improved in comparison to the prior angiogram. With the posterior cerebral artery somewhat protected, we elected to attempt to coil more of the residual aneurysm. The microwire was introduced into the microcatheter, and advanced into the lumen of the aneurysm through the stent. Microcatheter was then advanced into the residual aneurysm. The remainder of the above coils were then sequentially placed. After several coils were placed, I elected to complete the coiling procedure as I did not want to risk coil herniation through the tines of the stent and further compromise the posterior cerebral arteries. The coiling catheter was withdrawn, and the guide catheter was withdrawn down into the cervical vertebral artery. At this point, angiogram was taken which appear to reveal more decreased flow in the right posterior cerebral artery and what appear to be filling defect in the inferior wall of the right posterior cerebral artery adjacent to the coil mass. Therefore under roadmap guidance, I advanced the microcatheter over the microwire back into the lumen of the stent. I then administered a 8 mg bolus dose of Integrilin intra-arterially. After approximately 10 minutes, angiogram was taken which did reveal somewhat increased flow through the right posterior cerebral artery and better perfusion. I then administered another 8 mg bolus dose intra-arterially. After another 10 minutes, another angiogram was taken again revealing improved flow through the right posterior cerebral artery. At this point, the pharmacy was able to deliver intravenous Integrilin which was started at a rate of 2 micrograms/kg/minute with the plan to continue for 18 hours.  With improved flow in the right posterior cerebral artery, I elected to complete the procedure at this point. The guide catheter and guide sheath were then synchronously removed without incident. FINDINGS: Right internal carotid, head: Injection reveals the presence of a widely patent ICA, M1, and A1 segments and their branches. No aneurysms, AVMs, or high-flow fistulas are seen. Of note, the posterior communicating artery is very small and no visualization of the posterior circulation is noted. The parenchymal and venous phases are normal. The venous sinuses are widely patent. Left internal carotid, head: Injection reveals the presence of a widely patent ICA, A1, and M1 segments and their branches. No aneurysms, AVMs, or high-flow fistulas are seen. Similar to the contralateral side, there is no significant visualization of the left posterior communicating artery. The parenchymal and venous phases are normal. The venous sinuses are widely patent. Left vertebral: Injection reveals the presence of a widely patent vertebral artery. This leads to a widely patent basilar artery that terminates in bilateral P1. 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 This aneurysm measures approximately 6.8 x 5.8 mm with a neck measuring approximately 4.5 mm. The parenchymal and venous phases are  normal. The venous sinuses are widely patent. Right vertebral: The vertebral artery is widely patent. No PICA aneurysm is seen. See basilar description above. Right vertebral artery, head (during embolization) Angiograms taken during coiling of the basilar aneurysm through the right vertebral artery access demonstrate progressive occlusion primarily of the dome of the aneurysm. The right vertebral artery and bilateral posterior cerebral arteries appear to be widely patent. There is residual aneurysm both at the neck and centrally within  the aneurysm. No filling defects are noted. Right vertebral artery, head (post coiling) Angiogram taken after coiling through the right vertebral artery reveal occlusion of the dome of the aneurysm although there is residual aneurysm centrally and at the neck. There are small coil loops which appear to be in proximity to the origin of the right posterior cerebral artery. No filling defects are seen however flow through the right posterior cerebral artery is delayed in comparison to the left PCA, as well as in comparison to earlier angiograms suggesting flow limitation. Left vertebral artery, head (pre stent) Angiogram taken through the guide catheter in the left vertebral artery again demonstrates coil mass within the aneurysm with the residual filling as described above. Again seen is delayed filling of the right posterior cerebral artery with decreased perfusion in comparison to prior angiograms. Again, no filling defects are seen to suggest thrombus. Left vertebral artery, head (immediate post stent) Angiogram reveals widely patent left vertebral artery with stent extending from the mid basilar into the right P2 segment. No filling defects are seen. Flow within the right posterior cerebral artery appears to be improved in comparison to the pre stent angiogram with more symmetric filling of the bilateral posterior cerebral arteries. Left vertebral artery, head (during coiling) Angiogram reveals further coil mass within the residual aneurysm which is progressively occluded. No filling defects are noted. The posterior cerebral arteries remain patent. Left vertebral artery, head (immediate post coiling) Angiogram taken from the left vertebral artery reveals patent basilar artery. The left posterior cerebral artery remains widely patent. Stent is in stable position. There is coil mass within the aneurysm, with minimal neck residual. There is decreased flow through the right posterior cerebral artery with filling  defect in the inferior wall of the right P1 adjacent to the distal aneurysm neck likely representing thrombus. There is decreased perfusion of the right posterior cerebral artery territory in comparison to the prior angiograms. Left vertebral artery, head (post thrombolysis) Angiograms taken 10 minutes after initial 8 mg bolus dose and taken again 10 minutes after the second 8 mg bolus dose sequentially reveal improvement in flow through the right posterior cerebral artery. There is also much smaller filling defect along the wall of the posterior cerebral artery adjacent to the coil mass indicating dissolution of some of the thrombus. The final angiogram does show minimally decreased flow through the right posterior cerebral artery with significantly improved perfusion of the right PCA territory in comparison to the previous angiograms. Venous sinuses remain widely patent. DISPOSITION: Upon completion of the study, the femoral sheath was secured in place with nylon suture and connected to a pressure system. The patient was extubated and transferred to the postanesthesia care unit in stable hemodynamic condition. IMPRESSION: 1. 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 Procedure was complicated by stent thrombosis treated with intra arterial and intravenous Integrilin. The preliminary results of this procedure were shared with the patient's family. Electronically Signed   By: Consuella Lose  On: 11/19/2020 20:28   IR Angiogram Follow Up Study  Result Date: 11/19/2020 PROCEDURE: DIAGNOSTIC CEREBRAL ANGIOGRAM COIL EMBOLIZATION OF BASILAR ANEURYSM HISTORY: The patient is a 60 year old woman presenting to the emergency department with several episodes of diplopia and approximally 1 week of significantly worsening headache. 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 She was therefore admitted to the hospital. After review of CTA it was felt that she would likely be a candidate for stent supported coiling. It was felt that her aneurysm was likely the cause of her new symptoms likely due to enlargement and irritation of the oculomotor nerve. Patient was loaded with 300 mg of Plavix and 325 mg of aspirin. ACCESS: The technical aspects of the procedure as well as its potential risks and benefits were reviewed with the patient and patient's family. These risks included but were not limited to stroke, intracranial hemorrhage, bleeding, infection, allergic reaction, damage to organs or vital structures, stroke, non-diagnostic procedure, and the catastrophic outcomes of heart attack, coma, and death. With an understanding of these risks, informed consent was obtained and witnessed. The patient was placed in the supine position on the angiography table and the skin of right groin prepped in the usual sterile fashion. The procedure was performed under general anesthesia. A 5-French sheath was introduced in the right common femoral artery using Seldinger technique. MEDICATIONS: HEPARIN: 8000 Units total. INTEGRILIN: 16mg  IA, 8mcg/kg/min IV continuous infusion CONTRAST:  69mL OMNIPAQUE IOHEXOL 300 MG/ML SOLN, 28mL OMNIPAQUE IOHEXOL 300 MG/ML SOLN, 65mL OMNIPAQUE IOHEXOL 300 MG/ML SOLNcc, Omnipaque 300 FLUOROSCOPY TIME:  FLUOROSCOPY TIME: See IR records TECHNIQUE: CATHETERS AND WIRES 5-French JB-1 catheter 180 cm 0.035" glidewire 6-French NeuronMax guide sheath 6-French Berenstein Select JB-1 catheter 0.070 Sofia guide catheter 0.058" CatV guidecatheter Synchro select standard microwire Synchro select soft microwire Asahi Chikai 10 microwire Excelsior XT-17 microcatheter x 2 COILS/STENTS USED Target 3D 6 mm x 15 cm Target 360 XL soft 5 mm by 10 cm Target 360 XL 4 mm x 12 cm Target 360 XL 3 mm x 9 cm not deployed Target 360 ultra 2 mm x 4 cm Target 360 ultra 2.5 mm x 4  cm x2 Target 360 ultra 2 mm x 4 cm x2 Neuroform Atlas 3.0 mm x 24 mm VESSELS CATHETERIZED Right internal carotid Left internal carotid Left vertebral Right vertebral Right common femoral Basilar artery Right posterior cerebral artery VESSELS STUDIED Right internal carotid, head Left internal carotid, head Left vertebral Right vertebral Right vertebral artery, head (during embolization) Right vertebral artery, head (post-coiling) Left vertebral artery, head (pre stent) Left vertebral artery, head (immediate post stent) Left vertebral artery, head (during coiling) Left vertebral artery, head (immediate post coiling) Left vertebral artery, head (post thrombolysis) PROCEDURAL NARRATIVE A 5-Fr JB-1 glide catheter was advanced over a 0.035 glidewire into the aortic arch. The above vessels were then sequentially catheterized and cervical / cerebral angiograms taken. After review of images, the catheter was removed without incident. The 5-Fr sheath was then exchanged over the glidewire for an 8-Fr sheath. Under real-time fluoroscopy, the guide sheath was advanced over the select catheter and glidewire into the descending aorta. 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 The guide sheath was then advanced into the proximal right vertebral artery. The Select catheter was removed and the 070 guide catheter was coaxially introduced over both the XT-17 micro catheters  and synchro select micro wires. The microcatheters were then advanced under roadmap guidance into the distal cervical vertebral artery. The guide catheter was then tracked over the microcatheter into the distal vertebral artery. The micro catheters were then further advanced into the proximal basilar artery. This allowed advancement of the guide catheter into the proximal basilar artery. Initially, multiple attempts were made to access the right posterior cerebral  artery unsuccessfully. This included a combination of different shape microcatheter tip. I then attempted access with the Chikai 10 microwire again with multiple different tip configurations all unsuccessfully. At this point I elected to attempt to place coils within the aneurysm without the aid of the stent as it did not appear access to the right posterior cerebral artery would be feasible. The three-dimensional coil as well as several filling coils were placed into the aneurysm. There is a small amount of coil loop extending down near the origin of the right posterior cerebral artery. As the last coil was being placed, some coil loops appear to be extending down further to words the origin of the posterior cerebral arteries and I therefore elected to stop coiling at this point. Angiogram was taken and the micro catheters were withdrawn down into the guide catheter. Guide catheter was then withdrawn down into the cervical vertebral artery and angiogram was taken. This did reveal decreased flow through the right posterior cerebral artery suggesting some compromise of the ostium possibly due to coil mass. I therefore elected to attempt to place a stent by accessing the left vertebral artery. The guide catheter was removed, and the guide sheath was withdrawn down into the descending aorta as was the guide sheath. The select catheter was introduced over the Glidewire, and the left subclavian followed by the proximal left vertebral artery were selected. Guide sheath was then advanced into the proximal left vertebral artery. Under roadmap guidance, the 058 CT 5 guide catheter was introduced over and XT 17 microcatheter and synchro select microwire. These were then navigated until the CAT 5 guide catheter was in the proximal basilar artery. The synchro select microwire was then used to access the right posterior cerebral artery. I was able to gain access to this vessel by buttressing against the coil mass already placed  in the aneurysm. Microcatheter was then tracked into the P2 segment. Microwire was removed, and the above stent was introduced. Stent was then deployed under roadmap guidance without incident. The microcatheter was then advanced over the pushing wire into the lumen of the stent. Angiogram was then taken. It did appear that flow through the right posterior cerebral artery was improved in comparison to the prior angiogram. With the posterior cerebral artery somewhat protected, we elected to attempt to coil more of the residual aneurysm. The microwire was introduced into the microcatheter, and advanced into the lumen of the aneurysm through the stent. Microcatheter was then advanced into the residual aneurysm. The remainder of the above coils were then sequentially placed. After several coils were placed, I elected to complete the coiling procedure as I did not want to risk coil herniation through the tines of the stent and further compromise the posterior cerebral arteries. The coiling catheter was withdrawn, and the guide catheter was withdrawn down into the cervical vertebral artery. At this point, angiogram was taken which appear to reveal more decreased flow in the right posterior cerebral artery and what appear to be filling defect in the inferior wall of the right posterior cerebral artery adjacent to the coil mass. Therefore  under roadmap guidance, I advanced the microcatheter over the microwire back into the lumen of the stent. I then administered a 8 mg bolus dose of Integrilin intra-arterially. After approximately 10 minutes, angiogram was taken which did reveal somewhat increased flow through the right posterior cerebral artery and better perfusion. I then administered another 8 mg bolus dose intra-arterially. After another 10 minutes, another angiogram was taken again revealing improved flow through the right posterior cerebral artery. At this point, the pharmacy was able to deliver intravenous Integrilin  which was started at a rate of 2 micrograms/kg/minute with the plan to continue for 18 hours. With improved flow in the right posterior cerebral artery, I elected to complete the procedure at this point. The guide catheter and guide sheath were then synchronously removed without incident. FINDINGS: Right internal carotid, head: Injection reveals the presence of a widely patent ICA, M1, and A1 segments and their branches. No aneurysms, AVMs, or high-flow fistulas are seen. Of note, the posterior communicating artery is very small and no visualization of the posterior circulation is noted. The parenchymal and venous phases are normal. The venous sinuses are widely patent. Left internal carotid, head: Injection reveals the presence of a widely patent ICA, A1, and M1 segments and their branches. No aneurysms, AVMs, or high-flow fistulas are seen. Similar to the contralateral side, there is no significant visualization of the left posterior communicating artery. The parenchymal and venous phases are normal. The venous sinuses are widely patent. Left vertebral: Injection reveals the presence of a widely patent vertebral artery. This leads to a widely patent basilar artery that terminates in bilateral P1. 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 This aneurysm measures approximately 6.8 x 5.8 mm with a neck measuring approximately 4.5 mm. The parenchymal and venous phases are normal. The venous sinuses are widely patent. Right vertebral: The vertebral artery is widely patent. No PICA aneurysm is seen. See basilar description above. Right vertebral artery, head (during embolization) Angiograms taken during coiling of the basilar aneurysm through the right vertebral artery access demonstrate progressive occlusion primarily of the dome of the aneurysm. The right vertebral artery and bilateral posterior cerebral arteries  appear to be widely patent. There is residual aneurysm both at the neck and centrally within the aneurysm. No filling defects are noted. Right vertebral artery, head (post coiling) Angiogram taken after coiling through the right vertebral artery reveal occlusion of the dome of the aneurysm although there is residual aneurysm centrally and at the neck. There are small coil loops which appear to be in proximity to the origin of the right posterior cerebral artery. No filling defects are seen however flow through the right posterior cerebral artery is delayed in comparison to the left PCA, as well as in comparison to earlier angiograms suggesting flow limitation. Left vertebral artery, head (pre stent) Angiogram taken through the guide catheter in the left vertebral artery again demonstrates coil mass within the aneurysm with the residual filling as described above. Again seen is delayed filling of the right posterior cerebral artery with decreased perfusion in comparison to prior angiograms. Again, no filling defects are seen to suggest thrombus. Left vertebral artery, head (immediate post stent) Angiogram reveals widely patent left vertebral artery with stent extending from the mid basilar into the right P2 segment. No filling defects are seen. Flow within the right posterior cerebral artery appears to be improved in comparison to the pre  stent angiogram with more symmetric filling of the bilateral posterior cerebral arteries. Left vertebral artery, head (during coiling) Angiogram reveals further coil mass within the residual aneurysm which is progressively occluded. No filling defects are noted. The posterior cerebral arteries remain patent. Left vertebral artery, head (immediate post coiling) Angiogram taken from the left vertebral artery reveals patent basilar artery. The left posterior cerebral artery remains widely patent. Stent is in stable position. There is coil mass within the aneurysm, with minimal neck  residual. There is decreased flow through the right posterior cerebral artery with filling defect in the inferior wall of the right P1 adjacent to the distal aneurysm neck likely representing thrombus. There is decreased perfusion of the right posterior cerebral artery territory in comparison to the prior angiograms. Left vertebral artery, head (post thrombolysis) Angiograms taken 10 minutes after initial 8 mg bolus dose and taken again 10 minutes after the second 8 mg bolus dose sequentially reveal improvement in flow through the right posterior cerebral artery. There is also much smaller filling defect along the wall of the posterior cerebral artery adjacent to the coil mass indicating dissolution of some of the thrombus. The final angiogram does show minimally decreased flow through the right posterior cerebral artery with significantly improved perfusion of the right PCA territory in comparison to the previous angiograms. Venous sinuses remain widely patent. DISPOSITION: Upon completion of the study, the femoral sheath was secured in place with nylon suture and connected to a pressure system. The patient was extubated and transferred to the postanesthesia care unit in stable hemodynamic condition. IMPRESSION: 1. 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 Procedure was complicated by stent thrombosis treated with intra arterial and intravenous Integrilin. The preliminary results of this procedure were shared with the patient's family. Electronically Signed   By: Consuella Lose   On: 11/19/2020 20:28   IR Angiogram Follow Up Study  Result Date: 11/19/2020 PROCEDURE: DIAGNOSTIC CEREBRAL ANGIOGRAM COIL EMBOLIZATION OF BASILAR ANEURYSM HISTORY: The patient is a 60 year old woman presenting to the emergency department with several episodes of diplopia and approximally 1 week of significantly worsening headache. Patient underwent CT and CT  angiogram in the emergency department demonstrating a relatively large right eccentric basilar apex aneurysm. She was therefore admitted to the hospital. After review of CTA it was felt that she would likely be a candidate for stent supported coiling. It was felt that her aneurysm was likely the cause of her new symptoms likely due to enlargement and irritation of the oculomotor nerve. Patient was loaded with 300 mg of Plavix and 325 mg of aspirin. ACCESS: The technical aspects of the procedure as well as its potential risks and benefits were reviewed with the patient and patient's family. These risks included but were not limited to stroke, intracranial hemorrhage, bleeding, infection, allergic reaction, damage to organs or vital structures, stroke, non-diagnostic procedure, and the catastrophic outcomes of heart attack, coma, and death. With an understanding of these risks, informed consent was obtained and witnessed. The patient was placed in the supine position on the angiography table and the skin of right groin prepped in the usual sterile fashion. The procedure was performed under general anesthesia. A 5-French sheath was introduced in the right common femoral artery using Seldinger technique. MEDICATIONS: HEPARIN: 8000 Units total. INTEGRILIN: 16mg  IA, 27mcg/kg/min IV continuous infusion CONTRAST:  40mL OMNIPAQUE IOHEXOL 300 MG/ML SOLN, 79mL OMNIPAQUE IOHEXOL 300 MG/ML SOLN, 86mL OMNIPAQUE IOHEXOL 300 MG/ML SOLNcc, Omnipaque 300  FLUOROSCOPY TIME:  FLUOROSCOPY TIME: See IR records TECHNIQUE: CATHETERS AND WIRES 5-French JB-1 catheter 180 cm 0.035" glidewire 6-French NeuronMax guide sheath 6-French Berenstein Select JB-1 catheter 0.070 Sofia guide catheter 0.058" CatV guidecatheter Synchro select standard microwire Synchro select soft microwire As[employer] Chikai 10 microwire Excelsior XT-17 microcatheter x 2 COILS/STENTS USED Target 3D 6 mm x 15 cm Target 360 XL soft 5 mm by 10 cm Target 360 XL 4 mm x 12 cm Target  360 XL 3 mm x 9 cm not deployed Target 360 ultra 2 mm x 4 cm Target 360 ultra 2.5 mm x 4 cm x2 Target 360 ultra 2 mm x 4 cm x2 Neuroform Atlas 3.0 mm x 24 mm VESSELS CATHETERIZED Right internal carotid Left internal carotid Left vertebral Right vertebral Right common femoral Basilar artery Right posterior cerebral artery VESSELS STUDIED Right internal carotid, head Left internal carotid, head Left vertebral Right vertebral Right vertebral artery, head (during embolization) Right vertebral artery, head (post-coiling) Left vertebral artery, head (pre stent) Left vertebral artery, head (immediate post stent) Left vertebral artery, head (during coiling) Left vertebral artery, head (immediate post coiling) Left vertebral artery, head (post thrombolysis) PROCEDURAL NARRATIVE A 5-Fr JB-1 glide catheter was advanced over a 0.035 glidewire into the aortic arch. The above vessels were then sequentially catheterized and cervical / cerebral angiograms taken. After review of images, the catheter was removed without incident. The 5-Fr sheath was then exchanged over the glidewire for an 8-Fr sheath. Under real-time fluoroscopy, the guide sheath was advanced over the select catheter and glidewire into the descending aorta. 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 The guide sheath was then advanced into the proximal right vertebral artery. The Select catheter was removed and the 070 guide catheter was coaxially introduced over both the XT-17 micro catheters and synchro select micro wires. The microcatheters were then advanced under roadmap guidance into the distal cervical vertebral artery. The guide catheter was then tracked over the microcatheter into the distal vertebral artery. The micro catheters were then further advanced into the proximal basilar artery. This allowed advancement of the guide catheter into the proximal basilar  artery. Initially, multiple attempts were made to access the right posterior cerebral artery unsuccessfully. This included a combination of different shape microcatheter tip. I then attempted access with the Chikai 10 microwire again with multiple different tip configurations all unsuccessfully. At this point I elected to attempt to place coils within the aneurysm without the aid of the stent as it did not appear access to the right posterior cerebral artery would be feasible. The three-dimensional coil as well as several filling coils were placed into the aneurysm. There is a small amount of coil loop extending down near the origin of the right posterior cerebral artery. As the last coil was being placed, some coil loops appear to be extending down further to words the origin of the posterior cerebral arteries and I therefore elected to stop coiling at this point. Angiogram was taken and the micro catheters were withdrawn down into the guide catheter. Guide catheter was then withdrawn down into the cervical vertebral artery and angiogram was taken. This did reveal decreased flow through the right posterior cerebral artery suggesting some compromise of the ostium possibly due to coil mass. I therefore elected to attempt to place a stent by accessing the left vertebral artery. The guide catheter was removed, and the guide sheath was withdrawn down into  the descending aorta as was the guide sheath. The select catheter was introduced over the Glidewire, and the left subclavian followed by the proximal left vertebral artery were selected. Guide sheath was then advanced into the proximal left vertebral artery. Under roadmap guidance, the 058 CT 5 guide catheter was introduced over and XT 17 microcatheter and synchro select microwire. These were then navigated until the CAT 5 guide catheter was in the proximal basilar artery. The synchro select microwire was then used to access the right posterior cerebral artery. I was  able to gain access to this vessel by buttressing against the coil mass already placed in the aneurysm. Microcatheter was then tracked into the P2 segment. Microwire was removed, and the above stent was introduced. Stent was then deployed under roadmap guidance without incident. The microcatheter was then advanced over the pushing wire into the lumen of the stent. Angiogram was then taken. It did appear that flow through the right posterior cerebral artery was improved in comparison to the prior angiogram. With the posterior cerebral artery somewhat protected, we elected to attempt to coil more of the residual aneurysm. The microwire was introduced into the microcatheter, and advanced into the lumen of the aneurysm through the stent. Microcatheter was then advanced into the residual aneurysm. The remainder of the above coils were then sequentially placed. After several coils were placed, I elected to complete the coiling procedure as I did not want to risk coil herniation through the tines of the stent and further compromise the posterior cerebral arteries. The coiling catheter was withdrawn, and the guide catheter was withdrawn down into the cervical vertebral artery. At this point, angiogram was taken which appear to reveal more decreased flow in the right posterior cerebral artery and what appear to be filling defect in the inferior wall of the right posterior cerebral artery adjacent to the coil mass. Therefore under roadmap guidance, I advanced the microcatheter over the microwire back into the lumen of the stent. I then administered a 8 mg bolus dose of Integrilin intra-arterially. After approximately 10 minutes, angiogram was taken which did reveal somewhat increased flow through the right posterior cerebral artery and better perfusion. I then administered another 8 mg bolus dose intra-arterially. After another 10 minutes, another angiogram was taken again revealing improved flow through the right posterior  cerebral artery. At this point, the pharmacy was able to deliver intravenous Integrilin which was started at a rate of 2 micrograms/kg/minute with the plan to continue for 18 hours. With improved flow in the right posterior cerebral artery, I elected to complete the procedure at this point. The guide catheter and guide sheath were then synchronously removed without incident. FINDINGS: Right internal carotid, head: Injection reveals the presence of a widely patent ICA, M1, and A1 segments and their branches. No aneurysms, AVMs, or high-flow fistulas are seen. Of note, the posterior communicating artery is very small and no visualization of the posterior circulation is noted. The parenchymal and venous phases are normal. The venous sinuses are widely patent. Left internal carotid, head: Injection reveals the presence of a widely patent ICA, A1, and M1 segments and their branches. No aneurysms, AVMs, or high-flow fistulas are seen. Similar to the contralateral side, there is no significant visualization of the left posterior communicating artery. The parenchymal and venous phases are normal. The venous sinuses are widely patent. Left vertebral: Injection reveals the presence of a widely patent vertebral artery. This leads to a widely patent basilar artery that terminates in bilateral P1.  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 This aneurysm measures approximately 6.8 x 5.8 mm with a neck measuring approximately 4.5 mm. The parenchymal and venous phases are normal. The venous sinuses are widely patent. Right vertebral: The vertebral artery is widely patent. No PICA aneurysm is seen. See basilar description above. Right vertebral artery, head (during embolization) Angiograms taken during coiling of the basilar aneurysm through the right vertebral artery access demonstrate progressive occlusion primarily of the  dome of the aneurysm. The right vertebral artery and bilateral posterior cerebral arteries appear to be widely patent. There is residual aneurysm both at the neck and centrally within the aneurysm. No filling defects are noted. Right vertebral artery, head (post coiling) Angiogram taken after coiling through the right vertebral artery reveal occlusion of the dome of the aneurysm although there is residual aneurysm centrally and at the neck. There are small coil loops which appear to be in proximity to the origin of the right posterior cerebral artery. No filling defects are seen however flow through the right posterior cerebral artery is delayed in comparison to the left PCA, as well as in comparison to earlier angiograms suggesting flow limitation. Left vertebral artery, head (pre stent) Angiogram taken through the guide catheter in the left vertebral artery again demonstrates coil mass within the aneurysm with the residual filling as described above. Again seen is delayed filling of the right posterior cerebral artery with decreased perfusion in comparison to prior angiograms. Again, no filling defects are seen to suggest thrombus. Left vertebral artery, head (immediate post stent) Angiogram reveals widely patent left vertebral artery with stent extending from the mid basilar into the right P2 segment. No filling defects are seen. Flow within the right posterior cerebral artery appears to be improved in comparison to the pre stent angiogram with more symmetric filling of the bilateral posterior cerebral arteries. Left vertebral artery, head (during coiling) Angiogram reveals further coil mass within the residual aneurysm which is progressively occluded. No filling defects are noted. The posterior cerebral arteries remain patent. Left vertebral artery, head (immediate post coiling) Angiogram taken from the left vertebral artery reveals patent basilar artery. The left posterior cerebral artery remains widely patent.  Stent is in stable position. There is coil mass within the aneurysm, with minimal neck residual. There is decreased flow through the right posterior cerebral artery with filling defect in the inferior wall of the right P1 adjacent to the distal aneurysm neck likely representing thrombus. There is decreased perfusion of the right posterior cerebral artery territory in comparison to the prior angiograms. Left vertebral artery, head (post thrombolysis) Angiograms taken 10 minutes after initial 8 mg bolus dose and taken again 10 minutes after the second 8 mg bolus dose sequentially reveal improvement in flow through the right posterior cerebral artery. There is also much smaller filling defect along the wall of the posterior cerebral artery adjacent to the coil mass indicating dissolution of some of the thrombus. The final angiogram does show minimally decreased flow through the right posterior cerebral artery with significantly improved perfusion of the right PCA territory in comparison to the previous angiograms. Venous sinuses remain widely patent. DISPOSITION: Upon completion of the study, the femoral sheath was secured in place with nylon suture and connected to a pressure system. The patient was extubated and transferred to the postanesthesia care unit in stable hemodynamic condition. IMPRESSION: 1. 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 Procedure was complicated by stent thrombosis treated with intra arterial and intravenous Integrilin. The preliminary results of this procedure were shared with the patient's family. Electronically Signed   By: Consuella Lose   On: 11/19/2020 20:28   IR Intra Cran Stent  Result Date: 11/19/2020 PROCEDURE: DIAGNOSTIC CEREBRAL ANGIOGRAM COIL EMBOLIZATION OF BASILAR ANEURYSM HISTORY: The patient is a 60 year old woman presenting to the emergency department with several episodes of diplopia and  approximally 1 week of significantly worsening headache. 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 She was therefore admitted to the hospital. After review of CTA it was felt that she would likely be a candidate for stent supported coiling. It was felt that her aneurysm was likely the cause of her new symptoms likely due to enlargement and irritation of the oculomotor nerve. Patient was loaded with 300 mg of Plavix and 325 mg of aspirin. ACCESS: The technical aspects of the procedure as well as its potential risks and benefits were reviewed with the patient and patient's family. These risks included but were not limited to stroke, intracranial hemorrhage, bleeding, infection, allergic reaction, damage to organs or vital structures, stroke, non-diagnostic procedure, and the catastrophic outcomes of heart attack, coma, and death. With an understanding of these risks, informed consent was obtained and witnessed. The patient was placed in the supine position on the angiography table and the skin of right groin prepped in the usual sterile fashion. The procedure was performed under general anesthesia. A 5-French sheath was introduced in the right common femoral artery using Seldinger technique. MEDICATIONS: HEPARIN: 8000 Units total. INTEGRILIN: 16mg  IA, 14mcg/kg/min IV continuous infusion CONTRAST:  47mL OMNIPAQUE IOHEXOL 300 MG/ML SOLN, 24mL OMNIPAQUE IOHEXOL 300 MG/ML SOLN, 72mL OMNIPAQUE IOHEXOL 300 MG/ML SOLNcc, Omnipaque 300 FLUOROSCOPY TIME:  FLUOROSCOPY TIME: See IR records TECHNIQUE: CATHETERS AND WIRES 5-French JB-1 catheter 180 cm 0.035" glidewire 6-French NeuronMax guide sheath 6-French Berenstein Select JB-1 catheter 0.070 Sofia guide catheter 0.058" CatV guidecatheter Synchro select standard microwire Synchro select soft microwire Asahi Chikai 10 microwire Excelsior XT-17 microcatheter x 2 COILS/STENTS USED Target  3D 6 mm x 15 cm Target 360 XL soft 5 mm by 10 cm Target 360 XL 4 mm x 12 cm Target 360 XL 3 mm x 9 cm not deployed Target 360 ultra 2 mm x 4 cm Target 360 ultra 2.5 mm x 4 cm x2 Target 360 ultra 2 mm x 4 cm x2 Neuroform Atlas 3.0 mm x 24 mm VESSELS CATHETERIZED Right internal carotid Left internal carotid Left vertebral Right vertebral Right common femoral Basilar artery Right posterior cerebral artery VESSELS STUDIED Right internal carotid, head Left internal carotid, head Left vertebral Right vertebral Right vertebral artery, head (during embolization) Right vertebral artery, head (post-coiling) Left vertebral artery, head (pre stent) Left vertebral artery, head (immediate post stent) Left vertebral artery, head (during coiling) Left vertebral artery, head (immediate post coiling) Left vertebral artery, head (post thrombolysis) PROCEDURAL NARRATIVE A 5-Fr JB-1 glide catheter was advanced over a 0.035 glidewire into the aortic arch. The above vessels were then sequentially catheterized and cervical / cerebral angiograms taken. After review of images, the catheter was removed without incident. The 5-Fr sheath was then exchanged over the glidewire for an 8-Fr sheath. Under real-time fluoroscopy, the guide sheath was advanced over the select catheter and glidewire into the descending aorta. The select catheter was then advanced into the  aberrant right subclavian artery, and subsequently into the proximal right vertebral artery. The guide sheath was then advanced into the proximal right vertebral artery. The Select catheter was removed and the 070 guide catheter was coaxially introduced over both the XT-17 micro catheters and synchro select micro wires. The microcatheters were then advanced under roadmap guidance into the distal cervical vertebral artery. The guide catheter was then tracked over the microcatheter into the distal vertebral artery. The micro catheters were then further advanced into the proximal basilar  artery. This allowed advancement of the guide catheter into the proximal basilar artery. Initially, multiple attempts were made to access the right posterior cerebral artery unsuccessfully. This included a combination of different shape microcatheter tip. I then attempted access with the Chikai 10 microwire again with multiple different tip configurations all unsuccessfully. At this point I elected to attempt to place coils within the aneurysm without the aid of the stent as it did not appear access to the right posterior cerebral artery would be feasible. The three-dimensional coil as well as several filling coils were placed into the aneurysm. There is a small amount of coil loop extending down near the origin of the right posterior cerebral artery. As the last coil was being placed, some coil loops appear to be extending down further to words the origin of the posterior cerebral arteries and I therefore elected to stop coiling at this point. Angiogram was taken and the micro catheters were withdrawn down into the guide catheter. Guide catheter was then withdrawn down into the cervical vertebral artery and angiogram was taken. This did reveal decreased flow through the right posterior cerebral artery suggesting some compromise of the ostium possibly due to coil mass. I therefore elected to attempt to place a stent by accessing the left vertebral artery. The guide catheter was removed, and the guide sheath was withdrawn down into the descending aorta as was the guide sheath. The select catheter was introduced over the Glidewire, and the left subclavian followed by the proximal left vertebral artery were selected. Guide sheath was then advanced into the proximal left vertebral artery. Under roadmap guidance, the 058 CT 5 guide catheter was introduced over and XT 17 microcatheter and synchro select microwire. These were then navigated until the CAT 5 guide catheter was in the proximal basilar artery. The synchro  select microwire was then used to access the right posterior cerebral artery. I was able to gain access to this vessel by buttressing against the coil mass already placed in the aneurysm. Microcatheter was then tracked into the P2 segment. Microwire was removed, and the above stent was introduced. Stent was then deployed under roadmap guidance without incident. The microcatheter was then advanced over the pushing wire into the lumen of the stent. Angiogram was then taken. It did appear that flow through the right posterior cerebral artery was improved in comparison to the prior angiogram. With the posterior cerebral artery somewhat protected, we elected to attempt to coil more of the residual aneurysm. The microwire was introduced into the microcatheter, and advanced into the lumen of the aneurysm through the stent. Microcatheter was then advanced into the residual aneurysm. The remainder of the above coils were then sequentially placed. After several coils were placed, I elected to complete the coiling procedure as I did not want to risk coil herniation through the tines of the stent and further compromise the posterior cerebral arteries. The coiling catheter was withdrawn, and the guide catheter was withdrawn down into the cervical vertebral  artery. At this point, angiogram was taken which appear to reveal more decreased flow in the right posterior cerebral artery and what appear to be filling defect in the inferior wall of the right posterior cerebral artery adjacent to the coil mass. Therefore under roadmap guidance, I advanced the microcatheter over the microwire back into the lumen of the stent. I then administered a 8 mg bolus dose of Integrilin intra-arterially. After approximately 10 minutes, angiogram was taken which did reveal somewhat increased flow through the right posterior cerebral artery and better perfusion. I then administered another 8 mg bolus dose intra-arterially. After another 10 minutes,  another angiogram was taken again revealing improved flow through the right posterior cerebral artery. At this point, the pharmacy was able to deliver intravenous Integrilin which was started at a rate of 2 micrograms/kg/minute with the plan to continue for 18 hours. With improved flow in the right posterior cerebral artery, I elected to complete the procedure at this point. The guide catheter and guide sheath were then synchronously removed without incident. FINDINGS: Right internal carotid, head: Injection reveals the presence of a widely patent ICA, M1, and A1 segments and their branches. No aneurysms, AVMs, or high-flow fistulas are seen. Of note, the posterior communicating artery is very small and no visualization of the posterior circulation is noted. The parenchymal and venous phases are normal. The venous sinuses are widely patent. Left internal carotid, head: Injection reveals the presence of a widely patent ICA, A1, and M1 segments and their branches. No aneurysms, AVMs, or high-flow fistulas are seen. Similar to the contralateral side, there is no significant visualization of the left posterior communicating artery. The parenchymal and venous phases are normal. The venous sinuses are widely patent. Left vertebral: Injection reveals the presence of a widely patent vertebral artery. This leads to a widely patent basilar artery that terminates in bilateral P1. 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 This aneurysm measures approximately 6.8 x 5.8 mm with a neck measuring approximately 4.5 mm. The parenchymal and venous phases are normal. The venous sinuses are widely patent. Right vertebral: The vertebral artery is widely patent. No PICA aneurysm is seen. See basilar description above. Right vertebral artery, head (during embolization) Angiograms taken during coiling of the basilar aneurysm through  the right vertebral artery access demonstrate progressive occlusion primarily of the dome of the aneurysm. The right vertebral artery and bilateral posterior cerebral arteries appear to be widely patent. There is residual aneurysm both at the neck and centrally within the aneurysm. No filling defects are noted. Right vertebral artery, head (post coiling) Angiogram taken after coiling through the right vertebral artery reveal occlusion of the dome of the aneurysm although there is residual aneurysm centrally and at the neck. There are small coil loops which appear to be in proximity to the origin of the right posterior cerebral artery. No filling defects are seen however flow through the right posterior cerebral artery is delayed in comparison to the left PCA, as well as in comparison to earlier angiograms suggesting flow limitation. Left vertebral artery, head (pre stent) Angiogram taken through the guide catheter in the left vertebral artery again demonstrates coil mass within the aneurysm with the residual filling as described above. Again seen is delayed filling of the right posterior cerebral artery with decreased perfusion in comparison to prior angiograms. Again, no filling defects are seen to suggest thrombus. Left vertebral artery, head (  immediate post stent) Angiogram reveals widely patent left vertebral artery with stent extending from the mid basilar into the right P2 segment. No filling defects are seen. Flow within the right posterior cerebral artery appears to be improved in comparison to the pre stent angiogram with more symmetric filling of the bilateral posterior cerebral arteries. Left vertebral artery, head (during coiling) Angiogram reveals further coil mass within the residual aneurysm which is progressively occluded. No filling defects are noted. The posterior cerebral arteries remain patent. Left vertebral artery, head (immediate post coiling) Angiogram taken from the left vertebral artery  reveals patent basilar artery. The left posterior cerebral artery remains widely patent. Stent is in stable position. There is coil mass within the aneurysm, with minimal neck residual. There is decreased flow through the right posterior cerebral artery with filling defect in the inferior wall of the right P1 adjacent to the distal aneurysm neck likely representing thrombus. There is decreased perfusion of the right posterior cerebral artery territory in comparison to the prior angiograms. Left vertebral artery, head (post thrombolysis) Angiograms taken 10 minutes after initial 8 mg bolus dose and taken again 10 minutes after the second 8 mg bolus dose sequentially reveal improvement in flow through the right posterior cerebral artery. There is also much smaller filling defect along the wall of the posterior cerebral artery adjacent to the coil mass indicating dissolution of some of the thrombus. The final angiogram does show minimally decreased flow through the right posterior cerebral artery with significantly improved perfusion of the right PCA territory in comparison to the previous angiograms. Venous sinuses remain widely patent. DISPOSITION: Upon completion of the study, the femoral sheath was secured in place with nylon suture and connected to a pressure system. The patient was extubated and transferred to the postanesthesia care unit in stable hemodynamic condition. IMPRESSION: 1. 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 Procedure was complicated by stent thrombosis treated with intra arterial and intravenous Integrilin. The preliminary results of this procedure were shared with the patient's family. Electronically Signed   By: Consuella Lose   On: 11/19/2020 20:28   IR ANGIO INTRA EXTRACRAN SEL INTERNAL CAROTID BILAT MOD SED  Result Date: 11/19/2020 PROCEDURE: DIAGNOSTIC CEREBRAL ANGIOGRAM COIL EMBOLIZATION OF BASILAR ANEURYSM  HISTORY: The patient is a 60 year old woman presenting to the emergency department with several episodes of diplopia and approximally 1 week of significantly worsening headache. 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 She was therefore admitted to the hospital. After review of CTA it was felt that she would likely be a candidate for stent supported coiling. It was felt that her aneurysm was likely the cause of her new symptoms likely due to enlargement and irritation of the oculomotor nerve. Patient was loaded with 300 mg of Plavix and 325 mg of aspirin. ACCESS: The technical aspects of the procedure as well as its potential risks and benefits were reviewed with the patient and patient's family. These risks included but were not limited to stroke, intracranial hemorrhage, bleeding, infection, allergic reaction, damage to organs or vital structures, stroke, non-diagnostic procedure, and the catastrophic outcomes of heart attack, coma, and death. With an understanding of these risks, informed consent was obtained and witnessed. The patient was placed in the supine position on the angiography table and the skin of right groin prepped in the usual sterile fashion. The procedure was performed under general anesthesia.  A 5-French sheath was introduced in the right common femoral artery using Seldinger technique. MEDICATIONS: HEPARIN: 8000 Units total. INTEGRILIN: 16mg  IA, 75mcg/kg/min IV continuous infusion CONTRAST:  28mL OMNIPAQUE IOHEXOL 300 MG/ML SOLN, 59mL OMNIPAQUE IOHEXOL 300 MG/ML SOLN, 72mL OMNIPAQUE IOHEXOL 300 MG/ML SOLNcc, Omnipaque 300 FLUOROSCOPY TIME:  FLUOROSCOPY TIME: See IR records TECHNIQUE: CATHETERS AND WIRES 5-French JB-1 catheter 180 cm 0.035" glidewire 6-French NeuronMax guide sheath 6-French Berenstein Select JB-1 catheter 0.070 Sofia guide catheter 0.058" CatV guidecatheter Synchro select standard  microwire Synchro select soft microwire Asahi Chikai 10 microwire Excelsior XT-17 microcatheter x 2 COILS/STENTS USED Target 3D 6 mm x 15 cm Target 360 XL soft 5 mm by 10 cm Target 360 XL 4 mm x 12 cm Target 360 XL 3 mm x 9 cm not deployed Target 360 ultra 2 mm x 4 cm Target 360 ultra 2.5 mm x 4 cm x2 Target 360 ultra 2 mm x 4 cm x2 Neuroform Atlas 3.0 mm x 24 mm VESSELS CATHETERIZED Right internal carotid Left internal carotid Left vertebral Right vertebral Right common femoral Basilar artery Right posterior cerebral artery VESSELS STUDIED Right internal carotid, head Left internal carotid, head Left vertebral Right vertebral Right vertebral artery, head (during embolization) Right vertebral artery, head (post-coiling) Left vertebral artery, head (pre stent) Left vertebral artery, head (immediate post stent) Left vertebral artery, head (during coiling) Left vertebral artery, head (immediate post coiling) Left vertebral artery, head (post thrombolysis) PROCEDURAL NARRATIVE A 5-Fr JB-1 glide catheter was advanced over a 0.035 glidewire into the aortic arch. The above vessels were then sequentially catheterized and cervical / cerebral angiograms taken. After review of images, the catheter was removed without incident. The 5-Fr sheath was then exchanged over the glidewire for an 8-Fr sheath. Under real-time fluoroscopy, the guide sheath was advanced over the select catheter and glidewire into the descending aorta. 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 The guide sheath was then advanced into the proximal right vertebral artery. The Select catheter was removed and the 070 guide catheter was coaxially introduced over both the XT-17 micro catheters and synchro select micro wires. The microcatheters were then advanced under roadmap guidance into the distal cervical vertebral artery. The guide catheter was then tracked over  the microcatheter into the distal vertebral artery. The micro catheters were then further advanced into the proximal basilar artery. This allowed advancement of the guide catheter into the proximal basilar artery. Initially, multiple attempts were made to access the right posterior cerebral artery unsuccessfully. This included a combination of different shape microcatheter tip. I then attempted access with the Chikai 10 microwire again with multiple different tip configurations all unsuccessfully. At this point I elected to attempt to place coils within the aneurysm without the aid of the stent as it did not appear access to the right posterior cerebral artery would be feasible. The three-dimensional coil as well as several filling coils were placed into the aneurysm. There is a small amount of coil loop extending down near the origin of the right posterior cerebral artery. As the last coil was being placed, some coil loops appear to be extending down further to words the origin of the posterior cerebral arteries and I therefore elected to stop coiling at this point. Angiogram was taken and the micro catheters were withdrawn down into the guide catheter. Guide catheter was then withdrawn down into the cervical vertebral artery and angiogram was taken. This did  reveal decreased flow through the right posterior cerebral artery suggesting some compromise of the ostium possibly due to coil mass. I therefore elected to attempt to place a stent by accessing the left vertebral artery. The guide catheter was removed, and the guide sheath was withdrawn down into the descending aorta as was the guide sheath. The select catheter was introduced over the Glidewire, and the left subclavian followed by the proximal left vertebral artery were selected. Guide sheath was then advanced into the proximal left vertebral artery. Under roadmap guidance, the 058 CT 5 guide catheter was introduced over and XT 17 microcatheter and synchro  select microwire. These were then navigated until the CAT 5 guide catheter was in the proximal basilar artery. The synchro select microwire was then used to access the right posterior cerebral artery. I was able to gain access to this vessel by buttressing against the coil mass already placed in the aneurysm. Microcatheter was then tracked into the P2 segment. Microwire was removed, and the above stent was introduced. Stent was then deployed under roadmap guidance without incident. The microcatheter was then advanced over the pushing wire into the lumen of the stent. Angiogram was then taken. It did appear that flow through the right posterior cerebral artery was improved in comparison to the prior angiogram. With the posterior cerebral artery somewhat protected, we elected to attempt to coil more of the residual aneurysm. The microwire was introduced into the microcatheter, and advanced into the lumen of the aneurysm through the stent. Microcatheter was then advanced into the residual aneurysm. The remainder of the above coils were then sequentially placed. After several coils were placed, I elected to complete the coiling procedure as I did not want to risk coil herniation through the tines of the stent and further compromise the posterior cerebral arteries. The coiling catheter was withdrawn, and the guide catheter was withdrawn down into the cervical vertebral artery. At this point, angiogram was taken which appear to reveal more decreased flow in the right posterior cerebral artery and what appear to be filling defect in the inferior wall of the right posterior cerebral artery adjacent to the coil mass. Therefore under roadmap guidance, I advanced the microcatheter over the microwire back into the lumen of the stent. I then administered a 8 mg bolus dose of Integrilin intra-arterially. After approximately 10 minutes, angiogram was taken which did reveal somewhat increased flow through the right posterior  cerebral artery and better perfusion. I then administered another 8 mg bolus dose intra-arterially. After another 10 minutes, another angiogram was taken again revealing improved flow through the right posterior cerebral artery. At this point, the pharmacy was able to deliver intravenous Integrilin which was started at a rate of 2 micrograms/kg/minute with the plan to continue for 18 hours. With improved flow in the right posterior cerebral artery, I elected to complete the procedure at this point. The guide catheter and guide sheath were then synchronously removed without incident. FINDINGS: Right internal carotid, head: Injection reveals the presence of a widely patent ICA, M1, and A1 segments and their branches. No aneurysms, AVMs, or high-flow fistulas are seen. Of note, the posterior communicating artery is very small and no visualization of the posterior circulation is noted. The parenchymal and venous phases are normal. The venous sinuses are widely patent. Left internal carotid, head: Injection reveals the presence of a widely patent ICA, A1, and M1 segments and their branches. No aneurysms, AVMs, or high-flow fistulas are seen. Similar to the contralateral side, there  is no significant visualization of the left posterior communicating artery. The parenchymal and venous phases are normal. The venous sinuses are widely patent. Left vertebral: Injection reveals the presence of a widely patent vertebral artery. This leads to a widely patent basilar artery that terminates in bilateral P1. 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 This aneurysm measures approximately 6.8 x 5.8 mm with a neck measuring approximately 4.5 mm. The parenchymal and venous phases are normal. The venous sinuses are widely patent. Right vertebral: The vertebral artery is widely patent. No PICA aneurysm is seen. See basilar  description above. Right vertebral artery, head (during embolization) Angiograms taken during coiling of the basilar aneurysm through the right vertebral artery access demonstrate progressive occlusion primarily of the dome of the aneurysm. The right vertebral artery and bilateral posterior cerebral arteries appear to be widely patent. There is residual aneurysm both at the neck and centrally within the aneurysm. No filling defects are noted. Right vertebral artery, head (post coiling) Angiogram taken after coiling through the right vertebral artery reveal occlusion of the dome of the aneurysm although there is residual aneurysm centrally and at the neck. There are small coil loops which appear to be in proximity to the origin of the right posterior cerebral artery. No filling defects are seen however flow through the right posterior cerebral artery is delayed in comparison to the left PCA, as well as in comparison to earlier angiograms suggesting flow limitation. Left vertebral artery, head (pre stent) Angiogram taken through the guide catheter in the left vertebral artery again demonstrates coil mass within the aneurysm with the residual filling as described above. Again seen is delayed filling of the right posterior cerebral artery with decreased perfusion in comparison to prior angiograms. Again, no filling defects are seen to suggest thrombus. Left vertebral artery, head (immediate post stent) Angiogram reveals widely patent left vertebral artery with stent extending from the mid basilar into the right P2 segment. No filling defects are seen. Flow within the right posterior cerebral artery appears to be improved in comparison to the pre stent angiogram with more symmetric filling of the bilateral posterior cerebral arteries. Left vertebral artery, head (during coiling) Angiogram reveals further coil mass within the residual aneurysm which is progressively occluded. No filling defects are noted. The posterior  cerebral arteries remain patent. Left vertebral artery, head (immediate post coiling) Angiogram taken from the left vertebral artery reveals patent basilar artery. The left posterior cerebral artery remains widely patent. Stent is in stable position. There is coil mass within the aneurysm, with minimal neck residual. There is decreased flow through the right posterior cerebral artery with filling defect in the inferior wall of the right P1 adjacent to the distal aneurysm neck likely representing thrombus. There is decreased perfusion of the right posterior cerebral artery territory in comparison to the prior angiograms. Left vertebral artery, head (post thrombolysis) Angiograms taken 10 minutes after initial 8 mg bolus dose and taken again 10 minutes after the second 8 mg bolus dose sequentially reveal improvement in flow through the right posterior cerebral artery. There is also much smaller filling defect along the wall of the posterior cerebral artery adjacent to the coil mass indicating dissolution of some of the thrombus. The final angiogram does show minimally decreased flow through the right posterior cerebral artery with significantly improved perfusion of the right PCA territory in comparison to the previous angiograms. Venous sinuses remain  widely patent. DISPOSITION: Upon completion of the study, the femoral sheath was secured in place with nylon suture and connected to a pressure system. The patient was extubated and transferred to the postanesthesia care unit in stable hemodynamic condition. IMPRESSION: 1. 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 Procedure was complicated by stent thrombosis treated with intra arterial and intravenous Integrilin. The preliminary results of this procedure were shared with the patient's family. Electronically Signed   By: Consuella Lose   On: 11/19/2020 20:28   IR ANGIO VERTEBRAL SEL VERTEBRAL  BILAT MOD SED  Result Date: 11/19/2020 PROCEDURE: DIAGNOSTIC CEREBRAL ANGIOGRAM COIL EMBOLIZATION OF BASILAR ANEURYSM HISTORY: The patient is a 60 year old woman presenting to the emergency department with several episodes of diplopia and approximally 1 week of significantly worsening headache. 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 She was therefore admitted to the hospital. After review of CTA it was felt that she would likely be a candidate for stent supported coiling. It was felt that her aneurysm was likely the cause of her new symptoms likely due to enlargement and irritation of the oculomotor nerve. Patient was loaded with 300 mg of Plavix and 325 mg of aspirin. ACCESS: The technical aspects of the procedure as well as its potential risks and benefits were reviewed with the patient and patient's family. These risks included but were not limited to stroke, intracranial hemorrhage, bleeding, infection, allergic reaction, damage to organs or vital structures, stroke, non-diagnostic procedure, and the catastrophic outcomes of heart attack, coma, and death. With an understanding of these risks, informed consent was obtained and witnessed. The patient was placed in the supine position on the angiography table and the skin of right groin prepped in the usual sterile fashion. The procedure was performed under general anesthesia. A 5-French sheath was introduced in the right common femoral artery using Seldinger technique. MEDICATIONS: HEPARIN: 8000 Units total. INTEGRILIN: 16mg  IA, 23mcg/kg/min IV continuous infusion CONTRAST:  93mL OMNIPAQUE IOHEXOL 300 MG/ML SOLN, 60mL OMNIPAQUE IOHEXOL 300 MG/ML SOLN, 53mL OMNIPAQUE IOHEXOL 300 MG/ML SOLNcc, Omnipaque 300 FLUOROSCOPY TIME:  FLUOROSCOPY TIME: See IR records TECHNIQUE: CATHETERS AND WIRES 5-French JB-1 catheter 180 cm 0.035" glidewire 6-French NeuronMax guide sheath  6-French Berenstein Select JB-1 catheter 0.070 Sofia guide catheter 0.058" CatV guidecatheter Synchro select standard microwire Synchro select soft microwire [employer] Chikai 10 microwire Excelsior XT-17 microcatheter x 2 COILS/STENTS USED Target 3D 6 mm x 15 cm Target 360 XL soft 5 mm by 10 cm Target 360 XL 4 mm x 12 cm Target 360 XL 3 mm x 9 cm not deployed Target 360 ultra 2 mm x 4 cm Target 360 ultra 2.5 mm x 4 cm x2 Target 360 ultra 2 mm x 4 cm x2 Neuroform Atlas 3.0 mm x 24 mm VESSELS CATHETERIZED Right internal carotid Left internal carotid Left vertebral Right vertebral Right common femoral Basilar artery Right posterior cerebral artery VESSELS STUDIED Right internal carotid, head Left internal carotid, head Left vertebral Right vertebral Right vertebral artery, head (during embolization) Right vertebral artery, head (post-coiling) Left vertebral artery, head (pre stent) Left vertebral artery, head (immediate post stent) Left vertebral artery, head (during coiling) Left vertebral artery, head (immediate post coiling) Left vertebral artery, head (post thrombolysis) PROCEDURAL NARRATIVE A 5-Fr JB-1 glide catheter was advanced over a 0.035 glidewire into the aortic arch. The above vessels were then sequentially catheterized and cervical /  cerebral angiograms taken. After review of images, the catheter was removed without incident. The 5-Fr sheath was then exchanged over the glidewire for an 8-Fr sheath. Under real-time fluoroscopy, the guide sheath was advanced over the select catheter and glidewire into the descending aorta. 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 The guide sheath was then advanced into the proximal right vertebral artery. The Select catheter was removed and the 070 guide catheter was coaxially introduced over both the XT-17 micro catheters and synchro select micro wires. The microcatheters were  then advanced under roadmap guidance into the distal cervical vertebral artery. The guide catheter was then tracked over the microcatheter into the distal vertebral artery. The micro catheters were then further advanced into the proximal basilar artery. This allowed advancement of the guide catheter into the proximal basilar artery. Initially, multiple attempts were made to access the right posterior cerebral artery unsuccessfully. This included a combination of different shape microcatheter tip. I then attempted access with the Chikai 10 microwire again with multiple different tip configurations all unsuccessfully. At this point I elected to attempt to place coils within the aneurysm without the aid of the stent as it did not appear access to the right posterior cerebral artery would be feasible. The three-dimensional coil as well as several filling coils were placed into the aneurysm. There is a small amount of coil loop extending down near the origin of the right posterior cerebral artery. As the last coil was being placed, some coil loops appear to be extending down further to words the origin of the posterior cerebral arteries and I therefore elected to stop coiling at this point. Angiogram was taken and the micro catheters were withdrawn down into the guide catheter. Guide catheter was then withdrawn down into the cervical vertebral artery and angiogram was taken. This did reveal decreased flow through the right posterior cerebral artery suggesting some compromise of the ostium possibly due to coil mass. I therefore elected to attempt to place a stent by accessing the left vertebral artery. The guide catheter was removed, and the guide sheath was withdrawn down into the descending aorta as was the guide sheath. The select catheter was introduced over the Glidewire, and the left subclavian followed by the proximal left vertebral artery were selected. Guide sheath was then advanced into the proximal left  vertebral artery. Under roadmap guidance, the 058 CT 5 guide catheter was introduced over and XT 17 microcatheter and synchro select microwire. These were then navigated until the CAT 5 guide catheter was in the proximal basilar artery. The synchro select microwire was then used to access the right posterior cerebral artery. I was able to gain access to this vessel by buttressing against the coil mass already placed in the aneurysm. Microcatheter was then tracked into the P2 segment. Microwire was removed, and the above stent was introduced. Stent was then deployed under roadmap guidance without incident. The microcatheter was then advanced over the pushing wire into the lumen of the stent. Angiogram was then taken. It did appear that flow through the right posterior cerebral artery was improved in comparison to the prior angiogram. With the posterior cerebral artery somewhat protected, we elected to attempt to coil more of the residual aneurysm. The microwire was introduced into the microcatheter, and advanced into the lumen of the aneurysm through the stent. Microcatheter was then advanced into the residual aneurysm. The remainder of the above coils were then  sequentially placed. After several coils were placed, I elected to complete the coiling procedure as I did not want to risk coil herniation through the tines of the stent and further compromise the posterior cerebral arteries. The coiling catheter was withdrawn, and the guide catheter was withdrawn down into the cervical vertebral artery. At this point, angiogram was taken which appear to reveal more decreased flow in the right posterior cerebral artery and what appear to be filling defect in the inferior wall of the right posterior cerebral artery adjacent to the coil mass. Therefore under roadmap guidance, I advanced the microcatheter over the microwire back into the lumen of the stent. I then administered a 8 mg bolus dose of Integrilin intra-arterially.  After approximately 10 minutes, angiogram was taken which did reveal somewhat increased flow through the right posterior cerebral artery and better perfusion. I then administered another 8 mg bolus dose intra-arterially. After another 10 minutes, another angiogram was taken again revealing improved flow through the right posterior cerebral artery. At this point, the pharmacy was able to deliver intravenous Integrilin which was started at a rate of 2 micrograms/kg/minute with the plan to continue for 18 hours. With improved flow in the right posterior cerebral artery, I elected to complete the procedure at this point. The guide catheter and guide sheath were then synchronously removed without incident. FINDINGS: Right internal carotid, head: Injection reveals the presence of a widely patent ICA, M1, and A1 segments and their branches. No aneurysms, AVMs, or high-flow fistulas are seen. Of note, the posterior communicating artery is very small and no visualization of the posterior circulation is noted. The parenchymal and venous phases are normal. The venous sinuses are widely patent. Left internal carotid, head: Injection reveals the presence of a widely patent ICA, A1, and M1 segments and their branches. No aneurysms, AVMs, or high-flow fistulas are seen. Similar to the contralateral side, there is no significant visualization of the left posterior communicating artery. The parenchymal and venous phases are normal. The venous sinuses are widely patent. Left vertebral: Injection reveals the presence of a widely patent vertebral artery. This leads to a widely patent basilar artery that terminates in bilateral P1. 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 This aneurysm measures approximately 6.8 x 5.8 mm with a neck measuring approximately 4.5 mm. The parenchymal and venous phases are normal. The venous sinuses  are widely patent. Right vertebral: The vertebral artery is widely patent. No PICA aneurysm is seen. See basilar description above. Right vertebral artery, head (during embolization) Angiograms taken during coiling of the basilar aneurysm through the right vertebral artery access demonstrate progressive occlusion primarily of the dome of the aneurysm. The right vertebral artery and bilateral posterior cerebral arteries appear to be widely patent. There is residual aneurysm both at the neck and centrally within the aneurysm. No filling defects are noted. Right vertebral artery, head (post coiling) Angiogram taken after coiling through the right vertebral artery reveal occlusion of the dome of the aneurysm although there is residual aneurysm centrally and at the neck. There are small coil loops which appear to be in proximity to the origin of the right posterior cerebral artery. No filling defects are seen however flow through the right posterior cerebral artery is delayed in comparison to the left PCA, as well as in comparison to earlier angiograms suggesting flow limitation. Left vertebral artery, head (pre stent) Angiogram taken through the guide  catheter in the left vertebral artery again demonstrates coil mass within the aneurysm with the residual filling as described above. Again seen is delayed filling of the right posterior cerebral artery with decreased perfusion in comparison to prior angiograms. Again, no filling defects are seen to suggest thrombus. Left vertebral artery, head (immediate post stent) Angiogram reveals widely patent left vertebral artery with stent extending from the mid basilar into the right P2 segment. No filling defects are seen. Flow within the right posterior cerebral artery appears to be improved in comparison to the pre stent angiogram with more symmetric filling of the bilateral posterior cerebral arteries. Left vertebral artery, head (during coiling) Angiogram reveals further coil  mass within the residual aneurysm which is progressively occluded. No filling defects are noted. The posterior cerebral arteries remain patent. Left vertebral artery, head (immediate post coiling) Angiogram taken from the left vertebral artery reveals patent basilar artery. The left posterior cerebral artery remains widely patent. Stent is in stable position. There is coil mass within the aneurysm, with minimal neck residual. There is decreased flow through the right posterior cerebral artery with filling defect in the inferior wall of the right P1 adjacent to the distal aneurysm neck likely representing thrombus. There is decreased perfusion of the right posterior cerebral artery territory in comparison to the prior angiograms. Left vertebral artery, head (post thrombolysis) Angiograms taken 10 minutes after initial 8 mg bolus dose and taken again 10 minutes after the second 8 mg bolus dose sequentially reveal improvement in flow through the right posterior cerebral artery. There is also much smaller filling defect along the wall of the posterior cerebral artery adjacent to the coil mass indicating dissolution of some of the thrombus. The final angiogram does show minimally decreased flow through the right posterior cerebral artery with significantly improved perfusion of the right PCA territory in comparison to the previous angiograms. Venous sinuses remain widely patent. DISPOSITION: Upon completion of the study, the femoral sheath was secured in place with nylon suture and connected to a pressure system. The patient was extubated and transferred to the postanesthesia care unit in stable hemodynamic condition. IMPRESSION: 1. 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 Procedure was complicated by stent thrombosis treated with intra arterial and intravenous Integrilin. The preliminary results of this procedure were shared with the patient's  family. Electronically Signed   By: Consuella Lose   On: 11/19/2020 20:28    Assessment/Plan: S/p stent-assisted coiling of basilar-P1 aneurysm - cont Integrilin gtt until 4pm - sheath removal at 8 pm followed by pressure - aspirin, brilinta - flat bedrest until tomorrow   Vallarie Mare 11/20/2020, 10:35 AM

## 2020-11-20 NOTE — Anesthesia Postprocedure Evaluation (Signed)
Anesthesia Post Note  Patient: Tiffany Horn  Procedure(s) Performed: stent supported coiling of basilar aneurysm     Patient location during evaluation: ICU Anesthesia Type: General Level of consciousness: patient cooperative and awake Pain management: pain level controlled Vital Signs Assessment: post-procedure vital signs reviewed and stable Respiratory status: spontaneous breathing, respiratory function stable, nonlabored ventilation and patient connected to nasal cannula oxygen Cardiovascular status: stable Postop Assessment: no apparent nausea or vomiting Anesthetic complications: no   No notable events documented.  Last Vitals:  Vitals:   11/19/20 2215 11/20/20 0000  BP:    Pulse: 77   Resp: 17   Temp:  37.1 C  SpO2: 94%     Last Pain:  Vitals:   11/20/20 0000  TempSrc: Oral  PainSc:                  Draeden Kellman

## 2020-11-21 NOTE — Progress Notes (Signed)
Subjective: Patient reports no headaches or leg or groin issues  Objective: Vital signs in last 24 hours: Temp:  [97.8 F (36.6 C)-99 F (37.2 C)] 97.8 F (36.6 C) (10/23 0800) Pulse Rate:  [58-82] 82 (10/23 0842) Resp:  [8-20] 16 (10/23 0842) BP: (92-141)/(57-104) 128/76 (10/23 0800) SpO2:  [92 %-98 %] 97 % (10/23 0842)  Intake/Output from previous day: 10/22 0701 - 10/23 0700 In: 2197 [I.V.:2197] Out: 1350 [Urine:1350] Intake/Output this shift: Total I/O In: 240 [P.O.:240] Out: -   Awake, alert and oriented x3.  Sitting in chair comfortably.  Groin is soft.  Good distal pulses.  Visual fields full by confrontation.  No pronator drift.  Lab Results: Recent Labs    11/20/20 0724  WBC 6.9  HGB 12.3  HCT 37.0  PLT 210   BMET No results for input(s): NA, K, CL, CO2, GLUCOSE, BUN, CREATININE, CALCIUM in the last 72 hours.  Studies/Results: IR Transcath/Emboliz  Result Date: 11/19/2020 PROCEDURE: DIAGNOSTIC CEREBRAL ANGIOGRAM COIL EMBOLIZATION OF BASILAR ANEURYSM HISTORY: The patient is a 60 year old woman presenting to the emergency department with several episodes of diplopia and approximally 1 week of significantly worsening headache. 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 She was therefore admitted to the hospital. After review of CTA it was felt that she would likely be a candidate for stent supported coiling. It was felt that her aneurysm was likely the cause of her new symptoms likely due to enlargement and irritation of the oculomotor nerve. Patient was loaded with 300 mg of Plavix and 325 mg of aspirin. ACCESS: The technical aspects of the procedure as well as its potential risks and benefits were reviewed with the patient and patient's family. These risks included but were not limited to stroke, intracranial hemorrhage, bleeding, infection, allergic reaction, damage to  organs or vital structures, stroke, non-diagnostic procedure, and the catastrophic outcomes of heart attack, coma, and death. With an understanding of these risks, informed consent was obtained and witnessed. The patient was placed in the supine position on the angiography table and the skin of right groin prepped in the usual sterile fashion. The procedure was performed under general anesthesia. A 5-French sheath was introduced in the right common femoral artery using Seldinger technique. MEDICATIONS: HEPARIN: 8000 Units total. INTEGRILIN: 16mg  IA, 2mcg/kg/min IV continuous infusion CONTRAST:  110mL OMNIPAQUE IOHEXOL 300 MG/ML SOLN, 30mL OMNIPAQUE IOHEXOL 300 MG/ML SOLN, 67mL OMNIPAQUE IOHEXOL 300 MG/ML SOLNcc, Omnipaque 300 FLUOROSCOPY TIME:  FLUOROSCOPY TIME: See IR records TECHNIQUE: CATHETERS AND WIRES 5-French JB-1 catheter 180 cm 0.035" glidewire 6-French NeuronMax guide sheath 6-French Berenstein Select JB-1 catheter 0.070 Sofia guide catheter 0.058" CatV guidecatheter Synchro select standard microwire Synchro select soft microwire Asahi Chikai 10 microwire Excelsior XT-17 microcatheter x 2 COILS/STENTS USED Target 3D 6 mm x 15 cm Target 360 XL soft 5 mm by 10 cm Target 360 XL 4 mm x 12 cm Target 360 XL 3 mm x 9 cm not deployed Target 360 ultra 2 mm x 4 cm Target 360 ultra 2.5 mm x 4 cm x2 Target 360 ultra 2 mm x 4 cm x2 Neuroform Atlas 3.0 mm x 24 mm VESSELS CATHETERIZED Right internal carotid Left internal carotid Left vertebral Right vertebral Right common femoral Basilar artery Right posterior cerebral artery VESSELS STUDIED Right internal carotid, head Left internal carotid, head Left vertebral Right vertebral Right vertebral artery, head (during embolization) Right vertebral artery, head (post-coiling) Left vertebral artery,  head (pre stent) Left vertebral artery, head (immediate post stent) Left vertebral artery, head (during coiling) Left vertebral artery, head (immediate post coiling) Left vertebral  artery, head (post thrombolysis) PROCEDURAL NARRATIVE A 5-Fr JB-1 glide catheter was advanced over a 0.035 glidewire into the aortic arch. The above vessels were then sequentially catheterized and cervical / cerebral angiograms taken. After review of images, the catheter was removed without incident. The 5-Fr sheath was then exchanged over the glidewire for an 8-Fr sheath. Under real-time fluoroscopy, the guide sheath was advanced over the select catheter and glidewire into the descending aorta. 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 The guide sheath was then advanced into the proximal right vertebral artery. The Select catheter was removed and the 070 guide catheter was coaxially introduced over both the XT-17 micro catheters and synchro select micro wires. The microcatheters were then advanced under roadmap guidance into the distal cervical vertebral artery. The guide catheter was then tracked over the microcatheter into the distal vertebral artery. The micro catheters were then further advanced into the proximal basilar artery. This allowed advancement of the guide catheter into the proximal basilar artery. Initially, multiple attempts were made to access the right posterior cerebral artery unsuccessfully. This included a combination of different shape microcatheter tip. I then attempted access with the Chikai 10 microwire again with multiple different tip configurations all unsuccessfully. At this point I elected to attempt to place coils within the aneurysm without the aid of the stent as it did not appear access to the right posterior cerebral artery would be feasible. The three-dimensional coil as well as several filling coils were placed into the aneurysm. There is a small amount of coil loop extending down near the origin of the right posterior cerebral artery. As the last coil was being placed, some coil  loops appear to be extending down further to words the origin of the posterior cerebral arteries and I therefore elected to stop coiling at this point. Angiogram was taken and the micro catheters were withdrawn down into the guide catheter. Guide catheter was then withdrawn down into the cervical vertebral artery and angiogram was taken. This did reveal decreased flow through the right posterior cerebral artery suggesting some compromise of the ostium possibly due to coil mass. I therefore elected to attempt to place a stent by accessing the left vertebral artery. The guide catheter was removed, and the guide sheath was withdrawn down into the descending aorta as was the guide sheath. The select catheter was introduced over the Glidewire, and the left subclavian followed by the proximal left vertebral artery were selected. Guide sheath was then advanced into the proximal left vertebral artery. Under roadmap guidance, the 058 CT 5 guide catheter was introduced over and XT 17 microcatheter and synchro select microwire. These were then navigated until the CAT 5 guide catheter was in the proximal basilar artery. The synchro select microwire was then used to access the right posterior cerebral artery. I was able to gain access to this vessel by buttressing against the coil mass already placed in the aneurysm. Microcatheter was then tracked into the P2 segment. Microwire was removed, and the above stent was introduced. Stent was then deployed under roadmap guidance without incident. The microcatheter was then advanced over the pushing wire into the lumen of the stent. Angiogram was then taken. It did appear that flow through the right posterior cerebral artery was improved in comparison  to the prior angiogram. With the posterior cerebral artery somewhat protected, we elected to attempt to coil more of the residual aneurysm. The microwire was introduced into the microcatheter, and advanced into the lumen of the aneurysm  through the stent. Microcatheter was then advanced into the residual aneurysm. The remainder of the above coils were then sequentially placed. After several coils were placed, I elected to complete the coiling procedure as I did not want to risk coil herniation through the tines of the stent and further compromise the posterior cerebral arteries. The coiling catheter was withdrawn, and the guide catheter was withdrawn down into the cervical vertebral artery. At this point, angiogram was taken which appear to reveal more decreased flow in the right posterior cerebral artery and what appear to be filling defect in the inferior wall of the right posterior cerebral artery adjacent to the coil mass. Therefore under roadmap guidance, I advanced the microcatheter over the microwire back into the lumen of the stent. I then administered a 8 mg bolus dose of Integrilin intra-arterially. After approximately 10 minutes, angiogram was taken which did reveal somewhat increased flow through the right posterior cerebral artery and better perfusion. I then administered another 8 mg bolus dose intra-arterially. After another 10 minutes, another angiogram was taken again revealing improved flow through the right posterior cerebral artery. At this point, the pharmacy was able to deliver intravenous Integrilin which was started at a rate of 2 micrograms/kg/minute with the plan to continue for 18 hours. With improved flow in the right posterior cerebral artery, I elected to complete the procedure at this point. The guide catheter and guide sheath were then synchronously removed without incident. FINDINGS: Right internal carotid, head: Injection reveals the presence of a widely patent ICA, M1, and A1 segments and their branches. No aneurysms, AVMs, or high-flow fistulas are seen. Of note, the posterior communicating artery is very small and no visualization of the posterior circulation is noted. The parenchymal and venous phases are  normal. The venous sinuses are widely patent. Left internal carotid, head: Injection reveals the presence of a widely patent ICA, A1, and M1 segments and their branches. No aneurysms, AVMs, or high-flow fistulas are seen. Similar to the contralateral side, there is no significant visualization of the left posterior communicating artery. The parenchymal and venous phases are normal. The venous sinuses are widely patent. Left vertebral: Injection reveals the presence of a widely patent vertebral artery. This leads to a widely patent basilar artery that terminates in bilateral P1. 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 This aneurysm measures approximately 6.8 x 5.8 mm with a neck measuring approximately 4.5 mm. The parenchymal and venous phases are normal. The venous sinuses are widely patent. Right vertebral: The vertebral artery is widely patent. No PICA aneurysm is seen. See basilar description above. Right vertebral artery, head (during embolization) Angiograms taken during coiling of the basilar aneurysm through the right vertebral artery access demonstrate progressive occlusion primarily of the dome of the aneurysm. The right vertebral artery and bilateral posterior cerebral arteries appear to be widely patent. There is residual aneurysm both at the neck and centrally within the aneurysm. No filling defects are noted. Right vertebral artery, head (post coiling) Angiogram taken after coiling through the right vertebral artery reveal occlusion of the dome of the aneurysm although there is residual aneurysm centrally and at the neck. There are small coil loops which appear  to be in proximity to the origin of the right posterior cerebral artery. No filling defects are seen however flow through the right posterior cerebral artery is delayed in comparison to the left PCA, as well as in comparison to earlier  angiograms suggesting flow limitation. Left vertebral artery, head (pre stent) Angiogram taken through the guide catheter in the left vertebral artery again demonstrates coil mass within the aneurysm with the residual filling as described above. Again seen is delayed filling of the right posterior cerebral artery with decreased perfusion in comparison to prior angiograms. Again, no filling defects are seen to suggest thrombus. Left vertebral artery, head (immediate post stent) Angiogram reveals widely patent left vertebral artery with stent extending from the mid basilar into the right P2 segment. No filling defects are seen. Flow within the right posterior cerebral artery appears to be improved in comparison to the pre stent angiogram with more symmetric filling of the bilateral posterior cerebral arteries. Left vertebral artery, head (during coiling) Angiogram reveals further coil mass within the residual aneurysm which is progressively occluded. No filling defects are noted. The posterior cerebral arteries remain patent. Left vertebral artery, head (immediate post coiling) Angiogram taken from the left vertebral artery reveals patent basilar artery. The left posterior cerebral artery remains widely patent. Stent is in stable position. There is coil mass within the aneurysm, with minimal neck residual. There is decreased flow through the right posterior cerebral artery with filling defect in the inferior wall of the right P1 adjacent to the distal aneurysm neck likely representing thrombus. There is decreased perfusion of the right posterior cerebral artery territory in comparison to the prior angiograms. Left vertebral artery, head (post thrombolysis) Angiograms taken 10 minutes after initial 8 mg bolus dose and taken again 10 minutes after the second 8 mg bolus dose sequentially reveal improvement in flow through the right posterior cerebral artery. There is also much smaller filling defect along the wall of the  posterior cerebral artery adjacent to the coil mass indicating dissolution of some of the thrombus. The final angiogram does show minimally decreased flow through the right posterior cerebral artery with significantly improved perfusion of the right PCA territory in comparison to the previous angiograms. Venous sinuses remain widely patent. DISPOSITION: Upon completion of the study, the femoral sheath was secured in place with nylon suture and connected to a pressure system. The patient was extubated and transferred to the postanesthesia care unit in stable hemodynamic condition. IMPRESSION: 1. 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 Procedure was complicated by stent thrombosis treated with intra arterial and intravenous Integrilin. The preliminary results of this procedure were shared with the patient's family. Electronically Signed   By: Consuella Lose   On: 11/19/2020 20:28   IR Angiogram Follow Up Study  Result Date: 11/19/2020 PROCEDURE: DIAGNOSTIC CEREBRAL ANGIOGRAM COIL EMBOLIZATION OF BASILAR ANEURYSM HISTORY: The patient is a 60 year old woman presenting to the emergency department with several episodes of diplopia and approximally 1 week of significantly worsening headache. 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 She was therefore admitted to the hospital. After review of CTA it was felt that she would likely be a candidate for stent supported coiling. It was felt that her aneurysm was likely the cause of her new symptoms likely due to enlargement and irritation of the oculomotor nerve. Patient was loaded with 300 mg of Plavix and 325  mg of aspirin. ACCESS: The technical aspects of the procedure as well as its potential risks and benefits were reviewed with the patient and patient's family. These risks included but were not  limited to stroke, intracranial hemorrhage, bleeding, infection, allergic reaction, damage to organs or vital structures, stroke, non-diagnostic procedure, and the catastrophic outcomes of heart attack, coma, and death. With an understanding of these risks, informed consent was obtained and witnessed. The patient was placed in the supine position on the angiography table and the skin of right groin prepped in the usual sterile fashion. The procedure was performed under general anesthesia. A 5-French sheath was introduced in the right common femoral artery using Seldinger technique. MEDICATIONS: HEPARIN: 8000 Units total. INTEGRILIN: 16mg  IA, 31mcg/kg/min IV continuous infusion CONTRAST:  62mL OMNIPAQUE IOHEXOL 300 MG/ML SOLN, 23mL OMNIPAQUE IOHEXOL 300 MG/ML SOLN, 75mL OMNIPAQUE IOHEXOL 300 MG/ML SOLNcc, Omnipaque 300 FLUOROSCOPY TIME:  FLUOROSCOPY TIME: See IR records TECHNIQUE: CATHETERS AND WIRES 5-French JB-1 catheter 180 cm 0.035" glidewire 6-French NeuronMax guide sheath 6-French Berenstein Select JB-1 catheter 0.070 Sofia guide catheter 0.058" CatV guidecatheter Synchro select standard microwire Synchro select soft microwire As[employer] Chikai 10 microwire Excelsior XT-17 microcatheter x 2 COILS/STENTS USED Target 3D 6 mm x 15 cm Target 360 XL soft 5 mm by 10 cm Target 360 XL 4 mm x 12 cm Target 360 XL 3 mm x 9 cm not deployed Target 360 ultra 2 mm x 4 cm Target 360 ultra 2.5 mm x 4 cm x2 Target 360 ultra 2 mm x 4 cm x2 Neuroform Atlas 3.0 mm x 24 mm VESSELS CATHETERIZED Right internal carotid Left internal carotid Left vertebral Right vertebral Right common femoral Basilar artery Right posterior cerebral artery VESSELS STUDIED Right internal carotid, head Left internal carotid, head Left vertebral Right vertebral Right vertebral artery, head (during embolization) Right vertebral artery, head (post-coiling) Left vertebral artery, head (pre stent) Left vertebral artery, head (immediate post stent) Left vertebral  artery, head (during coiling) Left vertebral artery, head (immediate post coiling) Left vertebral artery, head (post thrombolysis) PROCEDURAL NARRATIVE A 5-Fr JB-1 glide catheter was advanced over a 0.035 glidewire into the aortic arch. The above vessels were then sequentially catheterized and cervical / cerebral angiograms taken. After review of images, the catheter was removed without incident. The 5-Fr sheath was then exchanged over the glidewire for an 8-Fr sheath. Under real-time fluoroscopy, the guide sheath was advanced over the select catheter and glidewire into the descending aorta. 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 The guide sheath was then advanced into the proximal right vertebral artery. The Select catheter was removed and the 070 guide catheter was coaxially introduced over both the XT-17 micro catheters and synchro select micro wires. The microcatheters were then advanced under roadmap guidance into the distal cervical vertebral artery. The guide catheter was then tracked over the microcatheter into the distal vertebral artery. The micro catheters were then further advanced into the proximal basilar artery. This allowed advancement of the guide catheter into the proximal basilar artery. Initially, multiple attempts were made to access the right posterior cerebral artery unsuccessfully. This included a combination of different shape microcatheter tip. I then attempted access with the Chikai 10 microwire again with multiple different tip configurations all unsuccessfully. At this point I elected to attempt to place coils within the aneurysm without the aid of the stent as it did not appear access to the right posterior cerebral  artery would be feasible. The three-dimensional coil as well as several filling coils were placed into the aneurysm. There is a small amount of coil loop extending down near  the origin of the right posterior cerebral artery. As the last coil was being placed, some coil loops appear to be extending down further to words the origin of the posterior cerebral arteries and I therefore elected to stop coiling at this point. Angiogram was taken and the micro catheters were withdrawn down into the guide catheter. Guide catheter was then withdrawn down into the cervical vertebral artery and angiogram was taken. This did reveal decreased flow through the right posterior cerebral artery suggesting some compromise of the ostium possibly due to coil mass. I therefore elected to attempt to place a stent by accessing the left vertebral artery. The guide catheter was removed, and the guide sheath was withdrawn down into the descending aorta as was the guide sheath. The select catheter was introduced over the Glidewire, and the left subclavian followed by the proximal left vertebral artery were selected. Guide sheath was then advanced into the proximal left vertebral artery. Under roadmap guidance, the 058 CT 5 guide catheter was introduced over and XT 17 microcatheter and synchro select microwire. These were then navigated until the CAT 5 guide catheter was in the proximal basilar artery. The synchro select microwire was then used to access the right posterior cerebral artery. I was able to gain access to this vessel by buttressing against the coil mass already placed in the aneurysm. Microcatheter was then tracked into the P2 segment. Microwire was removed, and the above stent was introduced. Stent was then deployed under roadmap guidance without incident. The microcatheter was then advanced over the pushing wire into the lumen of the stent. Angiogram was then taken. It did appear that flow through the right posterior cerebral artery was improved in comparison to the prior angiogram. With the posterior cerebral artery somewhat protected, we elected to attempt to coil more of the residual aneurysm. The  microwire was introduced into the microcatheter, and advanced into the lumen of the aneurysm through the stent. Microcatheter was then advanced into the residual aneurysm. The remainder of the above coils were then sequentially placed. After several coils were placed, I elected to complete the coiling procedure as I did not want to risk coil herniation through the tines of the stent and further compromise the posterior cerebral arteries. The coiling catheter was withdrawn, and the guide catheter was withdrawn down into the cervical vertebral artery. At this point, angiogram was taken which appear to reveal more decreased flow in the right posterior cerebral artery and what appear to be filling defect in the inferior wall of the right posterior cerebral artery adjacent to the coil mass. Therefore under roadmap guidance, I advanced the microcatheter over the microwire back into the lumen of the stent. I then administered a 8 mg bolus dose of Integrilin intra-arterially. After approximately 10 minutes, angiogram was taken which did reveal somewhat increased flow through the right posterior cerebral artery and better perfusion. I then administered another 8 mg bolus dose intra-arterially. After another 10 minutes, another angiogram was taken again revealing improved flow through the right posterior cerebral artery. At this point, the pharmacy was able to deliver intravenous Integrilin which was started at a rate of 2 micrograms/kg/minute with the plan to continue for 18 hours. With improved flow in the right posterior cerebral artery, I elected to complete the procedure at this point. The guide catheter  and guide sheath were then synchronously removed without incident. FINDINGS: Right internal carotid, head: Injection reveals the presence of a widely patent ICA, M1, and A1 segments and their branches. No aneurysms, AVMs, or high-flow fistulas are seen. Of note, the posterior communicating artery is very small and no  visualization of the posterior circulation is noted. The parenchymal and venous phases are normal. The venous sinuses are widely patent. Left internal carotid, head: Injection reveals the presence of a widely patent ICA, A1, and M1 segments and their branches. No aneurysms, AVMs, or high-flow fistulas are seen. Similar to the contralateral side, there is no significant visualization of the left posterior communicating artery. The parenchymal and venous phases are normal. The venous sinuses are widely patent. Left vertebral: Injection reveals the presence of a widely patent vertebral artery. This leads to a widely patent basilar artery that terminates in bilateral P1. 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 This aneurysm measures approximately 6.8 x 5.8 mm with a neck measuring approximately 4.5 mm. The parenchymal and venous phases are normal. The venous sinuses are widely patent. Right vertebral: The vertebral artery is widely patent. No PICA aneurysm is seen. See basilar description above. Right vertebral artery, head (during embolization) Angiograms taken during coiling of the basilar aneurysm through the right vertebral artery access demonstrate progressive occlusion primarily of the dome of the aneurysm. The right vertebral artery and bilateral posterior cerebral arteries appear to be widely patent. There is residual aneurysm both at the neck and centrally within the aneurysm. No filling defects are noted. Right vertebral artery, head (post coiling) Angiogram taken after coiling through the right vertebral artery reveal occlusion of the dome of the aneurysm although there is residual aneurysm centrally and at the neck. There are small coil loops which appear to be in proximity to the origin of the right posterior cerebral artery. No filling defects are seen however flow through the right posterior  cerebral artery is delayed in comparison to the left PCA, as well as in comparison to earlier angiograms suggesting flow limitation. Left vertebral artery, head (pre stent) Angiogram taken through the guide catheter in the left vertebral artery again demonstrates coil mass within the aneurysm with the residual filling as described above. Again seen is delayed filling of the right posterior cerebral artery with decreased perfusion in comparison to prior angiograms. Again, no filling defects are seen to suggest thrombus. Left vertebral artery, head (immediate post stent) Angiogram reveals widely patent left vertebral artery with stent extending from the mid basilar into the right P2 segment. No filling defects are seen. Flow within the right posterior cerebral artery appears to be improved in comparison to the pre stent angiogram with more symmetric filling of the bilateral posterior cerebral arteries. Left vertebral artery, head (during coiling) Angiogram reveals further coil mass within the residual aneurysm which is progressively occluded. No filling defects are noted. The posterior cerebral arteries remain patent. Left vertebral artery, head (immediate post coiling) Angiogram taken from the left vertebral artery reveals patent basilar artery. The left posterior cerebral artery remains widely patent. Stent is in stable position. There is coil mass within the aneurysm, with minimal neck residual. There is decreased flow through the right posterior cerebral artery with filling defect in the inferior wall of the right P1 adjacent to the distal aneurysm neck likely representing thrombus. There is decreased perfusion of the right posterior cerebral artery territory in  comparison to the prior angiograms. Left vertebral artery, head (post thrombolysis) Angiograms taken 10 minutes after initial 8 mg bolus dose and taken again 10 minutes after the second 8 mg bolus dose sequentially reveal improvement in flow through the  right posterior cerebral artery. There is also much smaller filling defect along the wall of the posterior cerebral artery adjacent to the coil mass indicating dissolution of some of the thrombus. The final angiogram does show minimally decreased flow through the right posterior cerebral artery with significantly improved perfusion of the right PCA territory in comparison to the previous angiograms. Venous sinuses remain widely patent. DISPOSITION: Upon completion of the study, the femoral sheath was secured in place with nylon suture and connected to a pressure system. The patient was extubated and transferred to the postanesthesia care unit in stable hemodynamic condition. IMPRESSION: 1. 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 Procedure was complicated by stent thrombosis treated with intra arterial and intravenous Integrilin. The preliminary results of this procedure were shared with the patient's family. Electronically Signed   By: Consuella Lose   On: 11/19/2020 20:28   IR Angiogram Follow Up Study  Result Date: 11/19/2020 PROCEDURE: DIAGNOSTIC CEREBRAL ANGIOGRAM COIL EMBOLIZATION OF BASILAR ANEURYSM HISTORY: The patient is a 60 year old woman presenting to the emergency department with several episodes of diplopia and approximally 1 week of significantly worsening headache. 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 She was therefore admitted to the hospital. After review of CTA it was felt that she would likely be a candidate for stent supported coiling. It was felt that her aneurysm was likely the cause of her new symptoms likely due to enlargement and irritation of the oculomotor nerve. Patient was loaded with 300 mg of Plavix and 325 mg of aspirin. ACCESS: The technical aspects of the procedure as well as its potential risks and  benefits were reviewed with the patient and patient's family. These risks included but were not limited to stroke, intracranial hemorrhage, bleeding, infection, allergic reaction, damage to organs or vital structures, stroke, non-diagnostic procedure, and the catastrophic outcomes of heart attack, coma, and death. With an understanding of these risks, informed consent was obtained and witnessed. The patient was placed in the supine position on the angiography table and the skin of right groin prepped in the usual sterile fashion. The procedure was performed under general anesthesia. A 5-French sheath was introduced in the right common femoral artery using Seldinger technique. MEDICATIONS: HEPARIN: 8000 Units total. INTEGRILIN: 16mg  IA, 32mcg/kg/min IV continuous infusion CONTRAST:  25mL OMNIPAQUE IOHEXOL 300 MG/ML SOLN, 59mL OMNIPAQUE IOHEXOL 300 MG/ML SOLN, 31mL OMNIPAQUE IOHEXOL 300 MG/ML SOLNcc, Omnipaque 300 FLUOROSCOPY TIME:  FLUOROSCOPY TIME: See IR records TECHNIQUE: CATHETERS AND WIRES 5-French JB-1 catheter 180 cm 0.035" glidewire 6-French NeuronMax guide sheath 6-French Berenstein Select JB-1 catheter 0.070 Sofia guide catheter 0.058" CatV guidecatheter Synchro select standard microwire Synchro select soft microwire Asahi Chikai 10 microwire Excelsior XT-17 microcatheter x 2 COILS/STENTS USED Target 3D 6 mm x 15 cm Target 360 XL soft 5 mm by 10 cm Target 360 XL 4 mm x 12 cm Target 360 XL 3 mm x 9 cm not deployed Target 360 ultra 2 mm x 4 cm Target 360 ultra 2.5 mm x 4 cm x2 Target 360 ultra 2 mm x 4 cm x2 Neuroform Atlas 3.0 mm x 24 mm VESSELS CATHETERIZED Right internal carotid  Left internal carotid Left vertebral Right vertebral Right common femoral Basilar artery Right posterior cerebral artery VESSELS STUDIED Right internal carotid, head Left internal carotid, head Left vertebral Right vertebral Right vertebral artery, head (during embolization) Right vertebral artery, head (post-coiling) Left vertebral  artery, head (pre stent) Left vertebral artery, head (immediate post stent) Left vertebral artery, head (during coiling) Left vertebral artery, head (immediate post coiling) Left vertebral artery, head (post thrombolysis) PROCEDURAL NARRATIVE A 5-Fr JB-1 glide catheter was advanced over a 0.035 glidewire into the aortic arch. The above vessels were then sequentially catheterized and cervical / cerebral angiograms taken. After review of images, the catheter was removed without incident. The 5-Fr sheath was then exchanged over the glidewire for an 8-Fr sheath. Under real-time fluoroscopy, the guide sheath was advanced over the select catheter and glidewire into the descending aorta. 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 The guide sheath was then advanced into the proximal right vertebral artery. The Select catheter was removed and the 070 guide catheter was coaxially introduced over both the XT-17 micro catheters and synchro select micro wires. The microcatheters were then advanced under roadmap guidance into the distal cervical vertebral artery. The guide catheter was then tracked over the microcatheter into the distal vertebral artery. The micro catheters were then further advanced into the proximal basilar artery. This allowed advancement of the guide catheter into the proximal basilar artery. Initially, multiple attempts were made to access the right posterior cerebral artery unsuccessfully. This included a combination of different shape microcatheter tip. I then attempted access with the Chikai 10 microwire again with multiple different tip configurations all unsuccessfully. At this point I elected to attempt to place coils within the aneurysm without the aid of the stent as it did not appear access to the right posterior cerebral artery would be feasible. The three-dimensional coil as well as several filling  coils were placed into the aneurysm. There is a small amount of coil loop extending down near the origin of the right posterior cerebral artery. As the last coil was being placed, some coil loops appear to be extending down further to words the origin of the posterior cerebral arteries and I therefore elected to stop coiling at this point. Angiogram was taken and the micro catheters were withdrawn down into the guide catheter. Guide catheter was then withdrawn down into the cervical vertebral artery and angiogram was taken. This did reveal decreased flow through the right posterior cerebral artery suggesting some compromise of the ostium possibly due to coil mass. I therefore elected to attempt to place a stent by accessing the left vertebral artery. The guide catheter was removed, and the guide sheath was withdrawn down into the descending aorta as was the guide sheath. The select catheter was introduced over the Glidewire, and the left subclavian followed by the proximal left vertebral artery were selected. Guide sheath was then advanced into the proximal left vertebral artery. Under roadmap guidance, the 058 CT 5 guide catheter was introduced over and XT 17 microcatheter and synchro select microwire. These were then navigated until the CAT 5 guide catheter was in the proximal basilar artery. The synchro select microwire was then used to access the right posterior cerebral artery. I was able to gain access to this vessel by buttressing against the coil mass already placed in the aneurysm. Microcatheter was then tracked into the P2 segment. Microwire was removed, and the above stent was  introduced. Stent was then deployed under roadmap guidance without incident. The microcatheter was then advanced over the pushing wire into the lumen of the stent. Angiogram was then taken. It did appear that flow through the right posterior cerebral artery was improved in comparison to the prior angiogram. With the posterior  cerebral artery somewhat protected, we elected to attempt to coil more of the residual aneurysm. The microwire was introduced into the microcatheter, and advanced into the lumen of the aneurysm through the stent. Microcatheter was then advanced into the residual aneurysm. The remainder of the above coils were then sequentially placed. After several coils were placed, I elected to complete the coiling procedure as I did not want to risk coil herniation through the tines of the stent and further compromise the posterior cerebral arteries. The coiling catheter was withdrawn, and the guide catheter was withdrawn down into the cervical vertebral artery. At this point, angiogram was taken which appear to reveal more decreased flow in the right posterior cerebral artery and what appear to be filling defect in the inferior wall of the right posterior cerebral artery adjacent to the coil mass. Therefore under roadmap guidance, I advanced the microcatheter over the microwire back into the lumen of the stent. I then administered a 8 mg bolus dose of Integrilin intra-arterially. After approximately 10 minutes, angiogram was taken which did reveal somewhat increased flow through the right posterior cerebral artery and better perfusion. I then administered another 8 mg bolus dose intra-arterially. After another 10 minutes, another angiogram was taken again revealing improved flow through the right posterior cerebral artery. At this point, the pharmacy was able to deliver intravenous Integrilin which was started at a rate of 2 micrograms/kg/minute with the plan to continue for 18 hours. With improved flow in the right posterior cerebral artery, I elected to complete the procedure at this point. The guide catheter and guide sheath were then synchronously removed without incident. FINDINGS: Right internal carotid, head: Injection reveals the presence of a widely patent ICA, M1, and A1 segments and their branches. No aneurysms, AVMs,  or high-flow fistulas are seen. Of note, the posterior communicating artery is very small and no visualization of the posterior circulation is noted. The parenchymal and venous phases are normal. The venous sinuses are widely patent. Left internal carotid, head: Injection reveals the presence of a widely patent ICA, A1, and M1 segments and their branches. No aneurysms, AVMs, or high-flow fistulas are seen. Similar to the contralateral side, there is no significant visualization of the left posterior communicating artery. The parenchymal and venous phases are normal. The venous sinuses are widely patent. Left vertebral: Injection reveals the presence of a widely patent vertebral artery. This leads to a widely patent basilar artery that terminates in bilateral P1. 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 This aneurysm measures approximately 6.8 x 5.8 mm with a neck measuring approximately 4.5 mm. The parenchymal and venous phases are normal. The venous sinuses are widely patent. Right vertebral: The vertebral artery is widely patent. No PICA aneurysm is seen. See basilar description above. Right vertebral artery, head (during embolization) Angiograms taken during coiling of the basilar aneurysm through the right vertebral artery access demonstrate progressive occlusion primarily of the dome of the aneurysm. The right vertebral artery and bilateral posterior cerebral arteries appear to be widely patent. There is residual aneurysm both at the neck and centrally within the aneurysm. No  filling defects are noted. Right vertebral artery, head (post coiling) Angiogram taken after coiling through the right vertebral artery reveal occlusion of the dome of the aneurysm although there is residual aneurysm centrally and at the neck. There are small coil loops which appear to be in proximity to the origin of the right  posterior cerebral artery. No filling defects are seen however flow through the right posterior cerebral artery is delayed in comparison to the left PCA, as well as in comparison to earlier angiograms suggesting flow limitation. Left vertebral artery, head (pre stent) Angiogram taken through the guide catheter in the left vertebral artery again demonstrates coil mass within the aneurysm with the residual filling as described above. Again seen is delayed filling of the right posterior cerebral artery with decreased perfusion in comparison to prior angiograms. Again, no filling defects are seen to suggest thrombus. Left vertebral artery, head (immediate post stent) Angiogram reveals widely patent left vertebral artery with stent extending from the mid basilar into the right P2 segment. No filling defects are seen. Flow within the right posterior cerebral artery appears to be improved in comparison to the pre stent angiogram with more symmetric filling of the bilateral posterior cerebral arteries. Left vertebral artery, head (during coiling) Angiogram reveals further coil mass within the residual aneurysm which is progressively occluded. No filling defects are noted. The posterior cerebral arteries remain patent. Left vertebral artery, head (immediate post coiling) Angiogram taken from the left vertebral artery reveals patent basilar artery. The left posterior cerebral artery remains widely patent. Stent is in stable position. There is coil mass within the aneurysm, with minimal neck residual. There is decreased flow through the right posterior cerebral artery with filling defect in the inferior wall of the right P1 adjacent to the distal aneurysm neck likely representing thrombus. There is decreased perfusion of the right posterior cerebral artery territory in comparison to the prior angiograms. Left vertebral artery, head (post thrombolysis) Angiograms taken 10 minutes after initial 8 mg bolus dose and taken again 10  minutes after the second 8 mg bolus dose sequentially reveal improvement in flow through the right posterior cerebral artery. There is also much smaller filling defect along the wall of the posterior cerebral artery adjacent to the coil mass indicating dissolution of some of the thrombus. The final angiogram does show minimally decreased flow through the right posterior cerebral artery with significantly improved perfusion of the right PCA territory in comparison to the previous angiograms. Venous sinuses remain widely patent. DISPOSITION: Upon completion of the study, the femoral sheath was secured in place with nylon suture and connected to a pressure system. The patient was extubated and transferred to the postanesthesia care unit in stable hemodynamic condition. IMPRESSION: 1. 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 Procedure was complicated by stent thrombosis treated with intra arterial and intravenous Integrilin. The preliminary results of this procedure were shared with the patient's family. Electronically Signed   By: Consuella Lose   On: 11/19/2020 20:28   IR Angiogram Follow Up Study  Result Date: 11/19/2020 PROCEDURE: DIAGNOSTIC CEREBRAL ANGIOGRAM COIL EMBOLIZATION OF BASILAR ANEURYSM HISTORY: The patient is a 60 year old woman presenting to the emergency department with several episodes of diplopia and approximally 1 week of significantly worsening headache. 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 She was therefore admitted to the hospital. After review of CTA it was felt that  she would likely be a candidate for stent supported coiling. It was felt that her aneurysm was likely the cause of her new symptoms likely due to enlargement and irritation of the oculomotor nerve. Patient was loaded with 300 mg of Plavix and 325 mg of  aspirin. ACCESS: The technical aspects of the procedure as well as its potential risks and benefits were reviewed with the patient and patient's family. These risks included but were not limited to stroke, intracranial hemorrhage, bleeding, infection, allergic reaction, damage to organs or vital structures, stroke, non-diagnostic procedure, and the catastrophic outcomes of heart attack, coma, and death. With an understanding of these risks, informed consent was obtained and witnessed. The patient was placed in the supine position on the angiography table and the skin of right groin prepped in the usual sterile fashion. The procedure was performed under general anesthesia. A 5-French sheath was introduced in the right common femoral artery using Seldinger technique. MEDICATIONS: HEPARIN: 8000 Units total. INTEGRILIN: 16mg  IA, 55mcg/kg/min IV continuous infusion CONTRAST:  58mL OMNIPAQUE IOHEXOL 300 MG/ML SOLN, 43mL OMNIPAQUE IOHEXOL 300 MG/ML SOLN, 38mL OMNIPAQUE IOHEXOL 300 MG/ML SOLNcc, Omnipaque 300 FLUOROSCOPY TIME:  FLUOROSCOPY TIME: See IR records TECHNIQUE: CATHETERS AND WIRES 5-French JB-1 catheter 180 cm 0.035" glidewire 6-French NeuronMax guide sheath 6-French Berenstein Select JB-1 catheter 0.070 Sofia guide catheter 0.058" CatV guidecatheter Synchro select standard microwire Synchro select soft microwire Asahi Chikai 10 microwire Excelsior XT-17 microcatheter x 2 COILS/STENTS USED Target 3D 6 mm x 15 cm Target 360 XL soft 5 mm by 10 cm Target 360 XL 4 mm x 12 cm Target 360 XL 3 mm x 9 cm not deployed Target 360 ultra 2 mm x 4 cm Target 360 ultra 2.5 mm x 4 cm x2 Target 360 ultra 2 mm x 4 cm x2 Neuroform Atlas 3.0 mm x 24 mm VESSELS CATHETERIZED Right internal carotid Left internal carotid Left vertebral Right vertebral Right common femoral Basilar artery Right posterior cerebral artery VESSELS STUDIED Right internal carotid, head Left internal carotid, head Left vertebral Right vertebral Right vertebral  artery, head (during embolization) Right vertebral artery, head (post-coiling) Left vertebral artery, head (pre stent) Left vertebral artery, head (immediate post stent) Left vertebral artery, head (during coiling) Left vertebral artery, head (immediate post coiling) Left vertebral artery, head (post thrombolysis) PROCEDURAL NARRATIVE A 5-Fr JB-1 glide catheter was advanced over a 0.035 glidewire into the aortic arch. The above vessels were then sequentially catheterized and cervical / cerebral angiograms taken. After review of images, the catheter was removed without incident. The 5-Fr sheath was then exchanged over the glidewire for an 8-Fr sheath. Under real-time fluoroscopy, the guide sheath was advanced over the select catheter and glidewire into the descending aorta. 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 The guide sheath was then advanced into the proximal right vertebral artery. The Select catheter was removed and the 070 guide catheter was coaxially introduced over both the XT-17 micro catheters and synchro select micro wires. The microcatheters were then advanced under roadmap guidance into the distal cervical vertebral artery. The guide catheter was then tracked over the microcatheter into the distal vertebral artery. The micro catheters were then further advanced into the proximal basilar artery. This allowed advancement of the guide catheter into the proximal basilar artery. Initially, multiple attempts were made to access the right posterior cerebral artery unsuccessfully. This included a combination of different shape microcatheter tip. I then attempted  access with the Chikai 10 microwire again with multiple different tip configurations all unsuccessfully. At this point I elected to attempt to place coils within the aneurysm without the aid of the stent as it did not appear access to the right  posterior cerebral artery would be feasible. The three-dimensional coil as well as several filling coils were placed into the aneurysm. There is a small amount of coil loop extending down near the origin of the right posterior cerebral artery. As the last coil was being placed, some coil loops appear to be extending down further to words the origin of the posterior cerebral arteries and I therefore elected to stop coiling at this point. Angiogram was taken and the micro catheters were withdrawn down into the guide catheter. Guide catheter was then withdrawn down into the cervical vertebral artery and angiogram was taken. This did reveal decreased flow through the right posterior cerebral artery suggesting some compromise of the ostium possibly due to coil mass. I therefore elected to attempt to place a stent by accessing the left vertebral artery. The guide catheter was removed, and the guide sheath was withdrawn down into the descending aorta as was the guide sheath. The select catheter was introduced over the Glidewire, and the left subclavian followed by the proximal left vertebral artery were selected. Guide sheath was then advanced into the proximal left vertebral artery. Under roadmap guidance, the 058 CT 5 guide catheter was introduced over and XT 17 microcatheter and synchro select microwire. These were then navigated until the CAT 5 guide catheter was in the proximal basilar artery. The synchro select microwire was then used to access the right posterior cerebral artery. I was able to gain access to this vessel by buttressing against the coil mass already placed in the aneurysm. Microcatheter was then tracked into the P2 segment. Microwire was removed, and the above stent was introduced. Stent was then deployed under roadmap guidance without incident. The microcatheter was then advanced over the pushing wire into the lumen of the stent. Angiogram was then taken. It did appear that flow through the right  posterior cerebral artery was improved in comparison to the prior angiogram. With the posterior cerebral artery somewhat protected, we elected to attempt to coil more of the residual aneurysm. The microwire was introduced into the microcatheter, and advanced into the lumen of the aneurysm through the stent. Microcatheter was then advanced into the residual aneurysm. The remainder of the above coils were then sequentially placed. After several coils were placed, I elected to complete the coiling procedure as I did not want to risk coil herniation through the tines of the stent and further compromise the posterior cerebral arteries. The coiling catheter was withdrawn, and the guide catheter was withdrawn down into the cervical vertebral artery. At this point, angiogram was taken which appear to reveal more decreased flow in the right posterior cerebral artery and what appear to be filling defect in the inferior wall of the right posterior cerebral artery adjacent to the coil mass. Therefore under roadmap guidance, I advanced the microcatheter over the microwire back into the lumen of the stent. I then administered a 8 mg bolus dose of Integrilin intra-arterially. After approximately 10 minutes, angiogram was taken which did reveal somewhat increased flow through the right posterior cerebral artery and better perfusion. I then administered another 8 mg bolus dose intra-arterially. After another 10 minutes, another angiogram was taken again revealing improved flow through the right posterior cerebral artery. At this point, the pharmacy  was able to deliver intravenous Integrilin which was started at a rate of 2 micrograms/kg/minute with the plan to continue for 18 hours. With improved flow in the right posterior cerebral artery, I elected to complete the procedure at this point. The guide catheter and guide sheath were then synchronously removed without incident. FINDINGS: Right internal carotid, head: Injection reveals  the presence of a widely patent ICA, M1, and A1 segments and their branches. No aneurysms, AVMs, or high-flow fistulas are seen. Of note, the posterior communicating artery is very small and no visualization of the posterior circulation is noted. The parenchymal and venous phases are normal. The venous sinuses are widely patent. Left internal carotid, head: Injection reveals the presence of a widely patent ICA, A1, and M1 segments and their branches. No aneurysms, AVMs, or high-flow fistulas are seen. Similar to the contralateral side, there is no significant visualization of the left posterior communicating artery. The parenchymal and venous phases are normal. The venous sinuses are widely patent. Left vertebral: Injection reveals the presence of a widely patent vertebral artery. This leads to a widely patent basilar artery that terminates in bilateral P1. 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 This aneurysm measures approximately 6.8 x 5.8 mm with a neck measuring approximately 4.5 mm. The parenchymal and venous phases are normal. The venous sinuses are widely patent. Right vertebral: The vertebral artery is widely patent. No PICA aneurysm is seen. See basilar description above. Right vertebral artery, head (during embolization) Angiograms taken during coiling of the basilar aneurysm through the right vertebral artery access demonstrate progressive occlusion primarily of the dome of the aneurysm. The right vertebral artery and bilateral posterior cerebral arteries appear to be widely patent. There is residual aneurysm both at the neck and centrally within the aneurysm. No filling defects are noted. Right vertebral artery, head (post coiling) Angiogram taken after coiling through the right vertebral artery reveal occlusion of the dome of the aneurysm although there is residual aneurysm centrally and at the  neck. There are small coil loops which appear to be in proximity to the origin of the right posterior cerebral artery. No filling defects are seen however flow through the right posterior cerebral artery is delayed in comparison to the left PCA, as well as in comparison to earlier angiograms suggesting flow limitation. Left vertebral artery, head (pre stent) Angiogram taken through the guide catheter in the left vertebral artery again demonstrates coil mass within the aneurysm with the residual filling as described above. Again seen is delayed filling of the right posterior cerebral artery with decreased perfusion in comparison to prior angiograms. Again, no filling defects are seen to suggest thrombus. Left vertebral artery, head (immediate post stent) Angiogram reveals widely patent left vertebral artery with stent extending from the mid basilar into the right P2 segment. No filling defects are seen. Flow within the right posterior cerebral artery appears to be improved in comparison to the pre stent angiogram with more symmetric filling of the bilateral posterior cerebral arteries. Left vertebral artery, head (during coiling) Angiogram reveals further coil mass within the residual aneurysm which is progressively occluded. No filling defects are noted. The posterior cerebral arteries remain patent. Left vertebral artery, head (immediate post coiling) Angiogram taken from the left vertebral artery reveals patent basilar artery. The left posterior cerebral artery remains widely patent. Stent is in stable position. There is coil mass within the aneurysm, with minimal  neck residual. There is decreased flow through the right posterior cerebral artery with filling defect in the inferior wall of the right P1 adjacent to the distal aneurysm neck likely representing thrombus. There is decreased perfusion of the right posterior cerebral artery territory in comparison to the prior angiograms. Left vertebral artery, head  (post thrombolysis) Angiograms taken 10 minutes after initial 8 mg bolus dose and taken again 10 minutes after the second 8 mg bolus dose sequentially reveal improvement in flow through the right posterior cerebral artery. There is also much smaller filling defect along the wall of the posterior cerebral artery adjacent to the coil mass indicating dissolution of some of the thrombus. The final angiogram does show minimally decreased flow through the right posterior cerebral artery with significantly improved perfusion of the right PCA territory in comparison to the previous angiograms. Venous sinuses remain widely patent. DISPOSITION: Upon completion of the study, the femoral sheath was secured in place with nylon suture and connected to a pressure system. The patient was extubated and transferred to the postanesthesia care unit in stable hemodynamic condition. IMPRESSION: 1. 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 Procedure was complicated by stent thrombosis treated with intra arterial and intravenous Integrilin. The preliminary results of this procedure were shared with the patient's family. Electronically Signed   By: Consuella Lose   On: 11/19/2020 20:28   IR Angiogram Follow Up Study  Result Date: 11/19/2020 PROCEDURE: DIAGNOSTIC CEREBRAL ANGIOGRAM COIL EMBOLIZATION OF BASILAR ANEURYSM HISTORY: The patient is a 60 year old woman presenting to the emergency department with several episodes of diplopia and approximally 1 week of significantly worsening headache. 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 She was therefore admitted to the hospital. After review of CTA it was felt that she would likely be a candidate for stent supported coiling. It was felt that her aneurysm was likely the cause of her new symptoms likely due to enlargement  and irritation of the oculomotor nerve. Patient was loaded with 300 mg of Plavix and 325 mg of aspirin. ACCESS: The technical aspects of the procedure as well as its potential risks and benefits were reviewed with the patient and patient's family. These risks included but were not limited to stroke, intracranial hemorrhage, bleeding, infection, allergic reaction, damage to organs or vital structures, stroke, non-diagnostic procedure, and the catastrophic outcomes of heart attack, coma, and death. With an understanding of these risks, informed consent was obtained and witnessed. The patient was placed in the supine position on the angiography table and the skin of right groin prepped in the usual sterile fashion. The procedure was performed under general anesthesia. A 5-French sheath was introduced in the right common femoral artery using Seldinger technique. MEDICATIONS: HEPARIN: 8000 Units total. INTEGRILIN: 16mg  IA, 9mcg/kg/min IV continuous infusion CONTRAST:  24mL OMNIPAQUE IOHEXOL 300 MG/ML SOLN, 63mL OMNIPAQUE IOHEXOL 300 MG/ML SOLN, 7mL OMNIPAQUE IOHEXOL 300 MG/ML SOLNcc, Omnipaque 300 FLUOROSCOPY TIME:  FLUOROSCOPY TIME: See IR records TECHNIQUE: CATHETERS AND WIRES 5-French JB-1 catheter 180 cm 0.035" glidewire 6-French NeuronMax guide sheath 6-French Berenstein Select JB-1 catheter 0.070 Sofia guide catheter 0.058" CatV guidecatheter Synchro select standard microwire Synchro select soft microwire Asahi Chikai 10 microwire Excelsior XT-17 microcatheter x 2 COILS/STENTS USED Target 3D 6 mm x 15 cm Target 360 XL soft 5 mm by 10 cm Target 360 XL 4 mm x 12 cm Target 360 XL 3  mm x 9 cm not deployed Target 360 ultra 2 mm x 4 cm Target 360 ultra 2.5 mm x 4 cm x2 Target 360 ultra 2 mm x 4 cm x2 Neuroform Atlas 3.0 mm x 24 mm VESSELS CATHETERIZED Right internal carotid Left internal carotid Left vertebral Right vertebral Right common femoral Basilar artery Right posterior cerebral artery VESSELS STUDIED Right  internal carotid, head Left internal carotid, head Left vertebral Right vertebral Right vertebral artery, head (during embolization) Right vertebral artery, head (post-coiling) Left vertebral artery, head (pre stent) Left vertebral artery, head (immediate post stent) Left vertebral artery, head (during coiling) Left vertebral artery, head (immediate post coiling) Left vertebral artery, head (post thrombolysis) PROCEDURAL NARRATIVE A 5-Fr JB-1 glide catheter was advanced over a 0.035 glidewire into the aortic arch. The above vessels were then sequentially catheterized and cervical / cerebral angiograms taken. After review of images, the catheter was removed without incident. The 5-Fr sheath was then exchanged over the glidewire for an 8-Fr sheath. Under real-time fluoroscopy, the guide sheath was advanced over the select catheter and glidewire into the descending aorta. 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 The guide sheath was then advanced into the proximal right vertebral artery. The Select catheter was removed and the 070 guide catheter was coaxially introduced over both the XT-17 micro catheters and synchro select micro wires. The microcatheters were then advanced under roadmap guidance into the distal cervical vertebral artery. The guide catheter was then tracked over the microcatheter into the distal vertebral artery. The micro catheters were then further advanced into the proximal basilar artery. This allowed advancement of the guide catheter into the proximal basilar artery. Initially, multiple attempts were made to access the right posterior cerebral artery unsuccessfully. This included a combination of different shape microcatheter tip. I then attempted access with the Chikai 10 microwire again with multiple different tip configurations all unsuccessfully. At this point I elected to attempt to place coils  within the aneurysm without the aid of the stent as it did not appear access to the right posterior cerebral artery would be feasible. The three-dimensional coil as well as several filling coils were placed into the aneurysm. There is a small amount of coil loop extending down near the origin of the right posterior cerebral artery. As the last coil was being placed, some coil loops appear to be extending down further to words the origin of the posterior cerebral arteries and I therefore elected to stop coiling at this point. Angiogram was taken and the micro catheters were withdrawn down into the guide catheter. Guide catheter was then withdrawn down into the cervical vertebral artery and angiogram was taken. This did reveal decreased flow through the right posterior cerebral artery suggesting some compromise of the ostium possibly due to coil mass. I therefore elected to attempt to place a stent by accessing the left vertebral artery. The guide catheter was removed, and the guide sheath was withdrawn down into the descending aorta as was the guide sheath. The select catheter was introduced over the Glidewire, and the left subclavian followed by the proximal left vertebral artery were selected. Guide sheath was then advanced into the proximal left vertebral artery. Under roadmap guidance, the 058 CT 5 guide catheter was introduced over and XT 17 microcatheter and synchro select microwire. These were then navigated until the CAT 5 guide catheter was in the proximal basilar artery. The synchro select microwire was then  used to access the right posterior cerebral artery. I was able to gain access to this vessel by buttressing against the coil mass already placed in the aneurysm. Microcatheter was then tracked into the P2 segment. Microwire was removed, and the above stent was introduced. Stent was then deployed under roadmap guidance without incident. The microcatheter was then advanced over the pushing wire into the  lumen of the stent. Angiogram was then taken. It did appear that flow through the right posterior cerebral artery was improved in comparison to the prior angiogram. With the posterior cerebral artery somewhat protected, we elected to attempt to coil more of the residual aneurysm. The microwire was introduced into the microcatheter, and advanced into the lumen of the aneurysm through the stent. Microcatheter was then advanced into the residual aneurysm. The remainder of the above coils were then sequentially placed. After several coils were placed, I elected to complete the coiling procedure as I did not want to risk coil herniation through the tines of the stent and further compromise the posterior cerebral arteries. The coiling catheter was withdrawn, and the guide catheter was withdrawn down into the cervical vertebral artery. At this point, angiogram was taken which appear to reveal more decreased flow in the right posterior cerebral artery and what appear to be filling defect in the inferior wall of the right posterior cerebral artery adjacent to the coil mass. Therefore under roadmap guidance, I advanced the microcatheter over the microwire back into the lumen of the stent. I then administered a 8 mg bolus dose of Integrilin intra-arterially. After approximately 10 minutes, angiogram was taken which did reveal somewhat increased flow through the right posterior cerebral artery and better perfusion. I then administered another 8 mg bolus dose intra-arterially. After another 10 minutes, another angiogram was taken again revealing improved flow through the right posterior cerebral artery. At this point, the pharmacy was able to deliver intravenous Integrilin which was started at a rate of 2 micrograms/kg/minute with the plan to continue for 18 hours. With improved flow in the right posterior cerebral artery, I elected to complete the procedure at this point. The guide catheter and guide sheath were then  synchronously removed without incident. FINDINGS: Right internal carotid, head: Injection reveals the presence of a widely patent ICA, M1, and A1 segments and their branches. No aneurysms, AVMs, or high-flow fistulas are seen. Of note, the posterior communicating artery is very small and no visualization of the posterior circulation is noted. The parenchymal and venous phases are normal. The venous sinuses are widely patent. Left internal carotid, head: Injection reveals the presence of a widely patent ICA, A1, and M1 segments and their branches. No aneurysms, AVMs, or high-flow fistulas are seen. Similar to the contralateral side, there is no significant visualization of the left posterior communicating artery. The parenchymal and venous phases are normal. The venous sinuses are widely patent. Left vertebral: Injection reveals the presence of a widely patent vertebral artery. This leads to a widely patent basilar artery that terminates in bilateral P1. 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 This aneurysm measures approximately 6.8 x 5.8 mm with a neck measuring approximately 4.5 mm. The parenchymal and venous phases are normal. The venous sinuses are widely patent. Right vertebral: The vertebral artery is widely patent. No PICA aneurysm is seen. See basilar description above. Right vertebral artery, head (during embolization) Angiograms taken during coiling of the basilar aneurysm  through the right vertebral artery access demonstrate progressive occlusion primarily of the dome of the aneurysm. The right vertebral artery and bilateral posterior cerebral arteries appear to be widely patent. There is residual aneurysm both at the neck and centrally within the aneurysm. No filling defects are noted. Right vertebral artery, head (post coiling) Angiogram taken after coiling through the right vertebral artery  reveal occlusion of the dome of the aneurysm although there is residual aneurysm centrally and at the neck. There are small coil loops which appear to be in proximity to the origin of the right posterior cerebral artery. No filling defects are seen however flow through the right posterior cerebral artery is delayed in comparison to the left PCA, as well as in comparison to earlier angiograms suggesting flow limitation. Left vertebral artery, head (pre stent) Angiogram taken through the guide catheter in the left vertebral artery again demonstrates coil mass within the aneurysm with the residual filling as described above. Again seen is delayed filling of the right posterior cerebral artery with decreased perfusion in comparison to prior angiograms. Again, no filling defects are seen to suggest thrombus. Left vertebral artery, head (immediate post stent) Angiogram reveals widely patent left vertebral artery with stent extending from the mid basilar into the right P2 segment. No filling defects are seen. Flow within the right posterior cerebral artery appears to be improved in comparison to the pre stent angiogram with more symmetric filling of the bilateral posterior cerebral arteries. Left vertebral artery, head (during coiling) Angiogram reveals further coil mass within the residual aneurysm which is progressively occluded. No filling defects are noted. The posterior cerebral arteries remain patent. Left vertebral artery, head (immediate post coiling) Angiogram taken from the left vertebral artery reveals patent basilar artery. The left posterior cerebral artery remains widely patent. Stent is in stable position. There is coil mass within the aneurysm, with minimal neck residual. There is decreased flow through the right posterior cerebral artery with filling defect in the inferior wall of the right P1 adjacent to the distal aneurysm neck likely representing thrombus. There is decreased perfusion of the right  posterior cerebral artery territory in comparison to the prior angiograms. Left vertebral artery, head (post thrombolysis) Angiograms taken 10 minutes after initial 8 mg bolus dose and taken again 10 minutes after the second 8 mg bolus dose sequentially reveal improvement in flow through the right posterior cerebral artery. There is also much smaller filling defect along the wall of the posterior cerebral artery adjacent to the coil mass indicating dissolution of some of the thrombus. The final angiogram does show minimally decreased flow through the right posterior cerebral artery with significantly improved perfusion of the right PCA territory in comparison to the previous angiograms. Venous sinuses remain widely patent. DISPOSITION: Upon completion of the study, the femoral sheath was secured in place with nylon suture and connected to a pressure system. The patient was extubated and transferred to the postanesthesia care unit in stable hemodynamic condition. IMPRESSION: 1. 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 Procedure was complicated by stent thrombosis treated with intra arterial and intravenous Integrilin. The preliminary results of this procedure were shared with the patient's family. Electronically Signed   By: Consuella Lose   On: 11/19/2020 20:28   IR Angiogram Follow Up Study  Result Date: 11/19/2020 PROCEDURE: DIAGNOSTIC CEREBRAL ANGIOGRAM COIL EMBOLIZATION OF BASILAR ANEURYSM HISTORY: The patient is a 60 year old woman presenting to the emergency department with several episodes  of diplopia and approximally 1 week of significantly worsening headache. 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 She was therefore admitted to the hospital. After review of CTA it was felt that she would likely be a candidate for stent supported  coiling. It was felt that her aneurysm was likely the cause of her new symptoms likely due to enlargement and irritation of the oculomotor nerve. Patient was loaded with 300 mg of Plavix and 325 mg of aspirin. ACCESS: The technical aspects of the procedure as well as its potential risks and benefits were reviewed with the patient and patient's family. These risks included but were not limited to stroke, intracranial hemorrhage, bleeding, infection, allergic reaction, damage to organs or vital structures, stroke, non-diagnostic procedure, and the catastrophic outcomes of heart attack, coma, and death. With an understanding of these risks, informed consent was obtained and witnessed. The patient was placed in the supine position on the angiography table and the skin of right groin prepped in the usual sterile fashion. The procedure was performed under general anesthesia. A 5-French sheath was introduced in the right common femoral artery using Seldinger technique. MEDICATIONS: HEPARIN: 8000 Units total. INTEGRILIN: 16mg  IA, 67mcg/kg/min IV continuous infusion CONTRAST:  5mL OMNIPAQUE IOHEXOL 300 MG/ML SOLN, 23mL OMNIPAQUE IOHEXOL 300 MG/ML SOLN, 59mL OMNIPAQUE IOHEXOL 300 MG/ML SOLNcc, Omnipaque 300 FLUOROSCOPY TIME:  FLUOROSCOPY TIME: See IR records TECHNIQUE: CATHETERS AND WIRES 5-French JB-1 catheter 180 cm 0.035" glidewire 6-French NeuronMax guide sheath 6-French Berenstein Select JB-1 catheter 0.070 Sofia guide catheter 0.058" CatV guidecatheter Synchro select standard microwire Synchro select soft microwire [employer] Chikai 10 microwire Excelsior XT-17 microcatheter x 2 COILS/STENTS USED Target 3D 6 mm x 15 cm Target 360 XL soft 5 mm by 10 cm Target 360 XL 4 mm x 12 cm Target 360 XL 3 mm x 9 cm not deployed Target 360 ultra 2 mm x 4 cm Target 360 ultra 2.5 mm x 4 cm x2 Target 360 ultra 2 mm x 4 cm x2 Neuroform Atlas 3.0 mm x 24 mm VESSELS CATHETERIZED Right internal carotid Left internal carotid Left vertebral Right  vertebral Right common femoral Basilar artery Right posterior cerebral artery VESSELS STUDIED Right internal carotid, head Left internal carotid, head Left vertebral Right vertebral Right vertebral artery, head (during embolization) Right vertebral artery, head (post-coiling) Left vertebral artery, head (pre stent) Left vertebral artery, head (immediate post stent) Left vertebral artery, head (during coiling) Left vertebral artery, head (immediate post coiling) Left vertebral artery, head (post thrombolysis) PROCEDURAL NARRATIVE A 5-Fr JB-1 glide catheter was advanced over a 0.035 glidewire into the aortic arch. The above vessels were then sequentially catheterized and cervical / cerebral angiograms taken. After review of images, the catheter was removed without incident. The 5-Fr sheath was then exchanged over the glidewire for an 8-Fr sheath. Under real-time fluoroscopy, the guide sheath was advanced over the select catheter and glidewire into the descending aorta. 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 The guide sheath was then advanced into the proximal right vertebral artery. The Select catheter was removed and the 070 guide catheter was coaxially introduced over both the XT-17 micro catheters and synchro select micro wires. The microcatheters were then advanced under roadmap guidance into the distal cervical vertebral artery. The guide catheter was then tracked over the microcatheter into the distal vertebral artery. The micro catheters were then  further advanced into the proximal basilar artery. This allowed advancement of the guide catheter into the proximal basilar artery. Initially, multiple attempts were made to access the right posterior cerebral artery unsuccessfully. This included a combination of different shape microcatheter tip. I then attempted access with the Chikai 10 microwire again with multiple  different tip configurations all unsuccessfully. At this point I elected to attempt to place coils within the aneurysm without the aid of the stent as it did not appear access to the right posterior cerebral artery would be feasible. The three-dimensional coil as well as several filling coils were placed into the aneurysm. There is a small amount of coil loop extending down near the origin of the right posterior cerebral artery. As the last coil was being placed, some coil loops appear to be extending down further to words the origin of the posterior cerebral arteries and I therefore elected to stop coiling at this point. Angiogram was taken and the micro catheters were withdrawn down into the guide catheter. Guide catheter was then withdrawn down into the cervical vertebral artery and angiogram was taken. This did reveal decreased flow through the right posterior cerebral artery suggesting some compromise of the ostium possibly due to coil mass. I therefore elected to attempt to place a stent by accessing the left vertebral artery. The guide catheter was removed, and the guide sheath was withdrawn down into the descending aorta as was the guide sheath. The select catheter was introduced over the Glidewire, and the left subclavian followed by the proximal left vertebral artery were selected. Guide sheath was then advanced into the proximal left vertebral artery. Under roadmap guidance, the 058 CT 5 guide catheter was introduced over and XT 17 microcatheter and synchro select microwire. These were then navigated until the CAT 5 guide catheter was in the proximal basilar artery. The synchro select microwire was then used to access the right posterior cerebral artery. I was able to gain access to this vessel by buttressing against the coil mass already placed in the aneurysm. Microcatheter was then tracked into the P2 segment. Microwire was removed, and the above stent was introduced. Stent was then deployed under  roadmap guidance without incident. The microcatheter was then advanced over the pushing wire into the lumen of the stent. Angiogram was then taken. It did appear that flow through the right posterior cerebral artery was improved in comparison to the prior angiogram. With the posterior cerebral artery somewhat protected, we elected to attempt to coil more of the residual aneurysm. The microwire was introduced into the microcatheter, and advanced into the lumen of the aneurysm through the stent. Microcatheter was then advanced into the residual aneurysm. The remainder of the above coils were then sequentially placed. After several coils were placed, I elected to complete the coiling procedure as I did not want to risk coil herniation through the tines of the stent and further compromise the posterior cerebral arteries. The coiling catheter was withdrawn, and the guide catheter was withdrawn down into the cervical vertebral artery. At this point, angiogram was taken which appear to reveal more decreased flow in the right posterior cerebral artery and what appear to be filling defect in the inferior wall of the right posterior cerebral artery adjacent to the coil mass. Therefore under roadmap guidance, I advanced the microcatheter over the microwire back into the lumen of the stent. I then administered a 8 mg bolus dose of Integrilin intra-arterially. After approximately 10 minutes, angiogram was taken which did reveal  somewhat increased flow through the right posterior cerebral artery and better perfusion. I then administered another 8 mg bolus dose intra-arterially. After another 10 minutes, another angiogram was taken again revealing improved flow through the right posterior cerebral artery. At this point, the pharmacy was able to deliver intravenous Integrilin which was started at a rate of 2 micrograms/kg/minute with the plan to continue for 18 hours. With improved flow in the right posterior cerebral artery, I  elected to complete the procedure at this point. The guide catheter and guide sheath were then synchronously removed without incident. FINDINGS: Right internal carotid, head: Injection reveals the presence of a widely patent ICA, M1, and A1 segments and their branches. No aneurysms, AVMs, or high-flow fistulas are seen. Of note, the posterior communicating artery is very small and no visualization of the posterior circulation is noted. The parenchymal and venous phases are normal. The venous sinuses are widely patent. Left internal carotid, head: Injection reveals the presence of a widely patent ICA, A1, and M1 segments and their branches. No aneurysms, AVMs, or high-flow fistulas are seen. Similar to the contralateral side, there is no significant visualization of the left posterior communicating artery. The parenchymal and venous phases are normal. The venous sinuses are widely patent. Left vertebral: Injection reveals the presence of a widely patent vertebral artery. This leads to a widely patent basilar artery that terminates in bilateral P1. 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 This aneurysm measures approximately 6.8 x 5.8 mm with a neck measuring approximately 4.5 mm. The parenchymal and venous phases are normal. The venous sinuses are widely patent. Right vertebral: The vertebral artery is widely patent. No PICA aneurysm is seen. See basilar description above. Right vertebral artery, head (during embolization) Angiograms taken during coiling of the basilar aneurysm through the right vertebral artery access demonstrate progressive occlusion primarily of the dome of the aneurysm. The right vertebral artery and bilateral posterior cerebral arteries appear to be widely patent. There is residual aneurysm both at the neck and centrally within the aneurysm. No filling defects are noted. Right vertebral  artery, head (post coiling) Angiogram taken after coiling through the right vertebral artery reveal occlusion of the dome of the aneurysm although there is residual aneurysm centrally and at the neck. There are small coil loops which appear to be in proximity to the origin of the right posterior cerebral artery. No filling defects are seen however flow through the right posterior cerebral artery is delayed in comparison to the left PCA, as well as in comparison to earlier angiograms suggesting flow limitation. Left vertebral artery, head (pre stent) Angiogram taken through the guide catheter in the left vertebral artery again demonstrates coil mass within the aneurysm with the residual filling as described above. Again seen is delayed filling of the right posterior cerebral artery with decreased perfusion in comparison to prior angiograms. Again, no filling defects are seen to suggest thrombus. Left vertebral artery, head (immediate post stent) Angiogram reveals widely patent left vertebral artery with stent extending from the mid basilar into the right P2 segment. No filling defects are seen. Flow within the right posterior cerebral artery appears to be improved in comparison to the pre stent angiogram with more symmetric filling of the bilateral posterior cerebral arteries. Left vertebral artery, head (during coiling) Angiogram reveals further coil mass within the residual aneurysm which is progressively occluded. No filling defects are noted. The posterior  cerebral arteries remain patent. Left vertebral artery, head (immediate post coiling) Angiogram taken from the left vertebral artery reveals patent basilar artery. The left posterior cerebral artery remains widely patent. Stent is in stable position. There is coil mass within the aneurysm, with minimal neck residual. There is decreased flow through the right posterior cerebral artery with filling defect in the inferior wall of the right P1 adjacent to the  distal aneurysm neck likely representing thrombus. There is decreased perfusion of the right posterior cerebral artery territory in comparison to the prior angiograms. Left vertebral artery, head (post thrombolysis) Angiograms taken 10 minutes after initial 8 mg bolus dose and taken again 10 minutes after the second 8 mg bolus dose sequentially reveal improvement in flow through the right posterior cerebral artery. There is also much smaller filling defect along the wall of the posterior cerebral artery adjacent to the coil mass indicating dissolution of some of the thrombus. The final angiogram does show minimally decreased flow through the right posterior cerebral artery with significantly improved perfusion of the right PCA territory in comparison to the previous angiograms. Venous sinuses remain widely patent. DISPOSITION: Upon completion of the study, the femoral sheath was secured in place with nylon suture and connected to a pressure system. The patient was extubated and transferred to the postanesthesia care unit in stable hemodynamic condition. IMPRESSION: 1. 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 Procedure was complicated by stent thrombosis treated with intra arterial and intravenous Integrilin. The preliminary results of this procedure were shared with the patient's family. Electronically Signed   By: Consuella Lose   On: 11/19/2020 20:28   IR Angiogram Follow Up Study  Result Date: 11/19/2020 PROCEDURE: DIAGNOSTIC CEREBRAL ANGIOGRAM COIL EMBOLIZATION OF BASILAR ANEURYSM HISTORY: The patient is a 60 year old woman presenting to the emergency department with several episodes of diplopia and approximally 1 week of significantly worsening headache. 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 She was therefore admitted to the  hospital. After review of CTA it was felt that she would likely be a candidate for stent supported coiling. It was felt that her aneurysm was likely the cause of her new symptoms likely due to enlargement and irritation of the oculomotor nerve. Patient was loaded with 300 mg of Plavix and 325 mg of aspirin. ACCESS: The technical aspects of the procedure as well as its potential risks and benefits were reviewed with the patient and patient's family. These risks included but were not limited to stroke, intracranial hemorrhage, bleeding, infection, allergic reaction, damage to organs or vital structures, stroke, non-diagnostic procedure, and the catastrophic outcomes of heart attack, coma, and death. With an understanding of these risks, informed consent was obtained and witnessed. The patient was placed in the supine position on the angiography table and the skin of right groin prepped in the usual sterile fashion. The procedure was performed under general anesthesia. A 5-French sheath was introduced in the right common femoral artery using Seldinger technique. MEDICATIONS: HEPARIN: 8000 Units total. INTEGRILIN: 16mg  IA, 97mcg/kg/min IV continuous infusion CONTRAST:  72mL OMNIPAQUE IOHEXOL 300 MG/ML SOLN, 47mL OMNIPAQUE IOHEXOL 300 MG/ML SOLN, 34mL OMNIPAQUE IOHEXOL 300 MG/ML SOLNcc, Omnipaque 300 FLUOROSCOPY TIME:  FLUOROSCOPY TIME: See IR records TECHNIQUE: CATHETERS AND WIRES 5-French JB-1 catheter 180 cm 0.035" glidewire 6-French NeuronMax guide sheath 6-French Berenstein Select JB-1 catheter 0.070 Sofia guide catheter 0.058" CatV guidecatheter Synchro select standard  microwire Synchro select soft microwire Asahi Chikai 10 microwire Excelsior XT-17 microcatheter x 2 COILS/STENTS USED Target 3D 6 mm x 15 cm Target 360 XL soft 5 mm by 10 cm Target 360 XL 4 mm x 12 cm Target 360 XL 3 mm x 9 cm not deployed Target 360 ultra 2 mm x 4 cm Target 360 ultra 2.5 mm x 4 cm x2 Target 360 ultra 2 mm x 4 cm x2 Neuroform Atlas  3.0 mm x 24 mm VESSELS CATHETERIZED Right internal carotid Left internal carotid Left vertebral Right vertebral Right common femoral Basilar artery Right posterior cerebral artery VESSELS STUDIED Right internal carotid, head Left internal carotid, head Left vertebral Right vertebral Right vertebral artery, head (during embolization) Right vertebral artery, head (post-coiling) Left vertebral artery, head (pre stent) Left vertebral artery, head (immediate post stent) Left vertebral artery, head (during coiling) Left vertebral artery, head (immediate post coiling) Left vertebral artery, head (post thrombolysis) PROCEDURAL NARRATIVE A 5-Fr JB-1 glide catheter was advanced over a 0.035 glidewire into the aortic arch. The above vessels were then sequentially catheterized and cervical / cerebral angiograms taken. After review of images, the catheter was removed without incident. The 5-Fr sheath was then exchanged over the glidewire for an 8-Fr sheath. Under real-time fluoroscopy, the guide sheath was advanced over the select catheter and glidewire into the descending aorta. 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 The guide sheath was then advanced into the proximal right vertebral artery. The Select catheter was removed and the 070 guide catheter was coaxially introduced over both the XT-17 micro catheters and synchro select micro wires. The microcatheters were then advanced under roadmap guidance into the distal cervical vertebral artery. The guide catheter was then tracked over the microcatheter into the distal vertebral artery. The micro catheters were then further advanced into the proximal basilar artery. This allowed advancement of the guide catheter into the proximal basilar artery. Initially, multiple attempts were made to access the right posterior cerebral artery unsuccessfully. This included a combination of  different shape microcatheter tip. I then attempted access with the Chikai 10 microwire again with multiple different tip configurations all unsuccessfully. At this point I elected to attempt to place coils within the aneurysm without the aid of the stent as it did not appear access to the right posterior cerebral artery would be feasible. The three-dimensional coil as well as several filling coils were placed into the aneurysm. There is a small amount of coil loop extending down near the origin of the right posterior cerebral artery. As the last coil was being placed, some coil loops appear to be extending down further to words the origin of the posterior cerebral arteries and I therefore elected to stop coiling at this point. Angiogram was taken and the micro catheters were withdrawn down into the guide catheter. Guide catheter was then withdrawn down into the cervical vertebral artery and angiogram was taken. This did reveal decreased flow through the right posterior cerebral artery suggesting some compromise of the ostium possibly due to coil mass. I therefore elected to attempt to place a stent by accessing the left vertebral artery. The guide catheter was removed, and the guide sheath was withdrawn down into the descending aorta as was the guide sheath. The select catheter was introduced over the Glidewire, and the left subclavian followed by the proximal left vertebral artery were selected. Guide sheath was then advanced into the proximal left  vertebral artery. Under roadmap guidance, the 058 CT 5 guide catheter was introduced over and XT 17 microcatheter and synchro select microwire. These were then navigated until the CAT 5 guide catheter was in the proximal basilar artery. The synchro select microwire was then used to access the right posterior cerebral artery. I was able to gain access to this vessel by buttressing against the coil mass already placed in the aneurysm. Microcatheter was then tracked into  the P2 segment. Microwire was removed, and the above stent was introduced. Stent was then deployed under roadmap guidance without incident. The microcatheter was then advanced over the pushing wire into the lumen of the stent. Angiogram was then taken. It did appear that flow through the right posterior cerebral artery was improved in comparison to the prior angiogram. With the posterior cerebral artery somewhat protected, we elected to attempt to coil more of the residual aneurysm. The microwire was introduced into the microcatheter, and advanced into the lumen of the aneurysm through the stent. Microcatheter was then advanced into the residual aneurysm. The remainder of the above coils were then sequentially placed. After several coils were placed, I elected to complete the coiling procedure as I did not want to risk coil herniation through the tines of the stent and further compromise the posterior cerebral arteries. The coiling catheter was withdrawn, and the guide catheter was withdrawn down into the cervical vertebral artery. At this point, angiogram was taken which appear to reveal more decreased flow in the right posterior cerebral artery and what appear to be filling defect in the inferior wall of the right posterior cerebral artery adjacent to the coil mass. Therefore under roadmap guidance, I advanced the microcatheter over the microwire back into the lumen of the stent. I then administered a 8 mg bolus dose of Integrilin intra-arterially. After approximately 10 minutes, angiogram was taken which did reveal somewhat increased flow through the right posterior cerebral artery and better perfusion. I then administered another 8 mg bolus dose intra-arterially. After another 10 minutes, another angiogram was taken again revealing improved flow through the right posterior cerebral artery. At this point, the pharmacy was able to deliver intravenous Integrilin which was started at a rate of 2 micrograms/kg/minute  with the plan to continue for 18 hours. With improved flow in the right posterior cerebral artery, I elected to complete the procedure at this point. The guide catheter and guide sheath were then synchronously removed without incident. FINDINGS: Right internal carotid, head: Injection reveals the presence of a widely patent ICA, M1, and A1 segments and their branches. No aneurysms, AVMs, or high-flow fistulas are seen. Of note, the posterior communicating artery is very small and no visualization of the posterior circulation is noted. The parenchymal and venous phases are normal. The venous sinuses are widely patent. Left internal carotid, head: Injection reveals the presence of a widely patent ICA, A1, and M1 segments and their branches. No aneurysms, AVMs, or high-flow fistulas are seen. Similar to the contralateral side, there is no significant visualization of the left posterior communicating artery. The parenchymal and venous phases are normal. The venous sinuses are widely patent. Left vertebral: Injection reveals the presence of a widely patent vertebral artery. This leads to a widely patent basilar artery that terminates in bilateral P1. 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 This aneurysm measures approximately 6.8 x 5.8 mm with a neck measuring approximately 4.5 mm.  The parenchymal and venous phases are normal. The venous sinuses are widely patent. Right vertebral: The vertebral artery is widely patent. No PICA aneurysm is seen. See basilar description above. Right vertebral artery, head (during embolization) Angiograms taken during coiling of the basilar aneurysm through the right vertebral artery access demonstrate progressive occlusion primarily of the dome of the aneurysm. The right vertebral artery and bilateral posterior cerebral arteries appear to be widely patent. There is residual  aneurysm both at the neck and centrally within the aneurysm. No filling defects are noted. Right vertebral artery, head (post coiling) Angiogram taken after coiling through the right vertebral artery reveal occlusion of the dome of the aneurysm although there is residual aneurysm centrally and at the neck. There are small coil loops which appear to be in proximity to the origin of the right posterior cerebral artery. No filling defects are seen however flow through the right posterior cerebral artery is delayed in comparison to the left PCA, as well as in comparison to earlier angiograms suggesting flow limitation. Left vertebral artery, head (pre stent) Angiogram taken through the guide catheter in the left vertebral artery again demonstrates coil mass within the aneurysm with the residual filling as described above. Again seen is delayed filling of the right posterior cerebral artery with decreased perfusion in comparison to prior angiograms. Again, no filling defects are seen to suggest thrombus. Left vertebral artery, head (immediate post stent) Angiogram reveals widely patent left vertebral artery with stent extending from the mid basilar into the right P2 segment. No filling defects are seen. Flow within the right posterior cerebral artery appears to be improved in comparison to the pre stent angiogram with more symmetric filling of the bilateral posterior cerebral arteries. Left vertebral artery, head (during coiling) Angiogram reveals further coil mass within the residual aneurysm which is progressively occluded. No filling defects are noted. The posterior cerebral arteries remain patent. Left vertebral artery, head (immediate post coiling) Angiogram taken from the left vertebral artery reveals patent basilar artery. The left posterior cerebral artery remains widely patent. Stent is in stable position. There is coil mass within the aneurysm, with minimal neck residual. There is decreased flow through the  right posterior cerebral artery with filling defect in the inferior wall of the right P1 adjacent to the distal aneurysm neck likely representing thrombus. There is decreased perfusion of the right posterior cerebral artery territory in comparison to the prior angiograms. Left vertebral artery, head (post thrombolysis) Angiograms taken 10 minutes after initial 8 mg bolus dose and taken again 10 minutes after the second 8 mg bolus dose sequentially reveal improvement in flow through the right posterior cerebral artery. There is also much smaller filling defect along the wall of the posterior cerebral artery adjacent to the coil mass indicating dissolution of some of the thrombus. The final angiogram does show minimally decreased flow through the right posterior cerebral artery with significantly improved perfusion of the right PCA territory in comparison to the previous angiograms. Venous sinuses remain widely patent. DISPOSITION: Upon completion of the study, the femoral sheath was secured in place with nylon suture and connected to a pressure system. The patient was extubated and transferred to the postanesthesia care unit in stable hemodynamic condition. IMPRESSION: 1. 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 Procedure was complicated by stent thrombosis treated with intra arterial and intravenous Integrilin. The preliminary results of this procedure were shared with the patient's family. Electronically Signed  By: Consuella Lose   On: 11/19/2020 20:28   IR Angiogram Follow Up Study  Result Date: 11/19/2020 PROCEDURE: DIAGNOSTIC CEREBRAL ANGIOGRAM COIL EMBOLIZATION OF BASILAR ANEURYSM HISTORY: The patient is a 61 year old woman presenting to the emergency department with several episodes of diplopia and approximally 1 week of significantly worsening headache. 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 She was therefore admitted to the hospital. After review of CTA it was felt that she would likely be a candidate for stent supported coiling. It was felt that her aneurysm was likely the cause of her new symptoms likely due to enlargement and irritation of the oculomotor nerve. Patient was loaded with 300 mg of Plavix and 325 mg of aspirin. ACCESS: The technical aspects of the procedure as well as its potential risks and benefits were reviewed with the patient and patient's family. These risks included but were not limited to stroke, intracranial hemorrhage, bleeding, infection, allergic reaction, damage to organs or vital structures, stroke, non-diagnostic procedure, and the catastrophic outcomes of heart attack, coma, and death. With an understanding of these risks, informed consent was obtained and witnessed. The patient was placed in the supine position on the angiography table and the skin of right groin prepped in the usual sterile fashion. The procedure was performed under general anesthesia. A 5-French sheath was introduced in the right common femoral artery using Seldinger technique. MEDICATIONS: HEPARIN: 8000 Units total. INTEGRILIN: 16mg  IA, 33mcg/kg/min IV continuous infusion CONTRAST:  26mL OMNIPAQUE IOHEXOL 300 MG/ML SOLN, 14mL OMNIPAQUE IOHEXOL 300 MG/ML SOLN, 72mL OMNIPAQUE IOHEXOL 300 MG/ML SOLNcc, Omnipaque 300 FLUOROSCOPY TIME:  FLUOROSCOPY TIME: See IR records TECHNIQUE: CATHETERS AND WIRES 5-French JB-1 catheter 180 cm 0.035" glidewire 6-French NeuronMax guide sheath 6-French Berenstein Select JB-1 catheter 0.070 Sofia guide catheter 0.058" CatV guidecatheter Synchro select standard microwire Synchro select soft microwire Asahi Chikai 10 microwire Excelsior XT-17 microcatheter x 2 COILS/STENTS USED Target 3D 6 mm x 15 cm Target 360 XL soft 5 mm by 10 cm Target 360 XL 4 mm x 12 cm Target 360 XL 3 mm x 9 cm not deployed Target  360 ultra 2 mm x 4 cm Target 360 ultra 2.5 mm x 4 cm x2 Target 360 ultra 2 mm x 4 cm x2 Neuroform Atlas 3.0 mm x 24 mm VESSELS CATHETERIZED Right internal carotid Left internal carotid Left vertebral Right vertebral Right common femoral Basilar artery Right posterior cerebral artery VESSELS STUDIED Right internal carotid, head Left internal carotid, head Left vertebral Right vertebral Right vertebral artery, head (during embolization) Right vertebral artery, head (post-coiling) Left vertebral artery, head (pre stent) Left vertebral artery, head (immediate post stent) Left vertebral artery, head (during coiling) Left vertebral artery, head (immediate post coiling) Left vertebral artery, head (post thrombolysis) PROCEDURAL NARRATIVE A 5-Fr JB-1 glide catheter was advanced over a 0.035 glidewire into the aortic arch. The above vessels were then sequentially catheterized and cervical / cerebral angiograms taken. After review of images, the catheter was removed without incident. The 5-Fr sheath was then exchanged over the glidewire for an 8-Fr sheath. Under real-time fluoroscopy, the guide sheath was advanced over the select catheter and glidewire into the descending aorta. 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 The guide sheath was then advanced into the proximal right vertebral artery. The Select catheter was removed and the 070 guide catheter was coaxially introduced  over both the XT-17 micro catheters and synchro select micro wires. The microcatheters were then advanced under roadmap guidance into the distal cervical vertebral artery. The guide catheter was then tracked over the microcatheter into the distal vertebral artery. The micro catheters were then further advanced into the proximal basilar artery. This allowed advancement of the guide catheter into the proximal basilar artery. Initially, multiple attempts were  made to access the right posterior cerebral artery unsuccessfully. This included a combination of different shape microcatheter tip. I then attempted access with the Chikai 10 microwire again with multiple different tip configurations all unsuccessfully. At this point I elected to attempt to place coils within the aneurysm without the aid of the stent as it did not appear access to the right posterior cerebral artery would be feasible. The three-dimensional coil as well as several filling coils were placed into the aneurysm. There is a small amount of coil loop extending down near the origin of the right posterior cerebral artery. As the last coil was being placed, some coil loops appear to be extending down further to words the origin of the posterior cerebral arteries and I therefore elected to stop coiling at this point. Angiogram was taken and the micro catheters were withdrawn down into the guide catheter. Guide catheter was then withdrawn down into the cervical vertebral artery and angiogram was taken. This did reveal decreased flow through the right posterior cerebral artery suggesting some compromise of the ostium possibly due to coil mass. I therefore elected to attempt to place a stent by accessing the left vertebral artery. The guide catheter was removed, and the guide sheath was withdrawn down into the descending aorta as was the guide sheath. The select catheter was introduced over the Glidewire, and the left subclavian followed by the proximal left vertebral artery were selected. Guide sheath was then advanced into the proximal left vertebral artery. Under roadmap guidance, the 058 CT 5 guide catheter was introduced over and XT 17 microcatheter and synchro select microwire. These were then navigated until the CAT 5 guide catheter was in the proximal basilar artery. The synchro select microwire was then used to access the right posterior cerebral artery. I was able to gain access to this vessel by  buttressing against the coil mass already placed in the aneurysm. Microcatheter was then tracked into the P2 segment. Microwire was removed, and the above stent was introduced. Stent was then deployed under roadmap guidance without incident. The microcatheter was then advanced over the pushing wire into the lumen of the stent. Angiogram was then taken. It did appear that flow through the right posterior cerebral artery was improved in comparison to the prior angiogram. With the posterior cerebral artery somewhat protected, we elected to attempt to coil more of the residual aneurysm. The microwire was introduced into the microcatheter, and advanced into the lumen of the aneurysm through the stent. Microcatheter was then advanced into the residual aneurysm. The remainder of the above coils were then sequentially placed. After several coils were placed, I elected to complete the coiling procedure as I did not want to risk coil herniation through the tines of the stent and further compromise the posterior cerebral arteries. The coiling catheter was withdrawn, and the guide catheter was withdrawn down into the cervical vertebral artery. At this point, angiogram was taken which appear to reveal more decreased flow in the right posterior cerebral artery and what appear to be filling defect in the inferior wall of the right posterior cerebral artery  adjacent to the coil mass. Therefore under roadmap guidance, I advanced the microcatheter over the microwire back into the lumen of the stent. I then administered a 8 mg bolus dose of Integrilin intra-arterially. After approximately 10 minutes, angiogram was taken which did reveal somewhat increased flow through the right posterior cerebral artery and better perfusion. I then administered another 8 mg bolus dose intra-arterially. After another 10 minutes, another angiogram was taken again revealing improved flow through the right posterior cerebral artery. At this point, the  pharmacy was able to deliver intravenous Integrilin which was started at a rate of 2 micrograms/kg/minute with the plan to continue for 18 hours. With improved flow in the right posterior cerebral artery, I elected to complete the procedure at this point. The guide catheter and guide sheath were then synchronously removed without incident. FINDINGS: Right internal carotid, head: Injection reveals the presence of a widely patent ICA, M1, and A1 segments and their branches. No aneurysms, AVMs, or high-flow fistulas are seen. Of note, the posterior communicating artery is very small and no visualization of the posterior circulation is noted. The parenchymal and venous phases are normal. The venous sinuses are widely patent. Left internal carotid, head: Injection reveals the presence of a widely patent ICA, A1, and M1 segments and their branches. No aneurysms, AVMs, or high-flow fistulas are seen. Similar to the contralateral side, there is no significant visualization of the left posterior communicating artery. The parenchymal and venous phases are normal. The venous sinuses are widely patent. Left vertebral: Injection reveals the presence of a widely patent vertebral artery. This leads to a widely patent basilar artery that terminates in bilateral P1. 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 This aneurysm measures approximately 6.8 x 5.8 mm with a neck measuring approximately 4.5 mm. The parenchymal and venous phases are normal. The venous sinuses are widely patent. Right vertebral: The vertebral artery is widely patent. No PICA aneurysm is seen. See basilar description above. Right vertebral artery, head (during embolization) Angiograms taken during coiling of the basilar aneurysm through the right vertebral artery access demonstrate progressive occlusion primarily of the dome of the aneurysm. The right  vertebral artery and bilateral posterior cerebral arteries appear to be widely patent. There is residual aneurysm both at the neck and centrally within the aneurysm. No filling defects are noted. Right vertebral artery, head (post coiling) Angiogram taken after coiling through the right vertebral artery reveal occlusion of the dome of the aneurysm although there is residual aneurysm centrally and at the neck. There are small coil loops which appear to be in proximity to the origin of the right posterior cerebral artery. No filling defects are seen however flow through the right posterior cerebral artery is delayed in comparison to the left PCA, as well as in comparison to earlier angiograms suggesting flow limitation. Left vertebral artery, head (pre stent) Angiogram taken through the guide catheter in the left vertebral artery again demonstrates coil mass within the aneurysm with the residual filling as described above. Again seen is delayed filling of the right posterior cerebral artery with decreased perfusion in comparison to prior angiograms. Again, no filling defects are seen to suggest thrombus. Left vertebral artery, head (immediate post stent) Angiogram reveals widely patent left vertebral artery with stent extending from the mid basilar into the right P2 segment. No filling defects are seen. Flow within the right posterior cerebral artery appears to be  improved in comparison to the pre stent angiogram with more symmetric filling of the bilateral posterior cerebral arteries. Left vertebral artery, head (during coiling) Angiogram reveals further coil mass within the residual aneurysm which is progressively occluded. No filling defects are noted. The posterior cerebral arteries remain patent. Left vertebral artery, head (immediate post coiling) Angiogram taken from the left vertebral artery reveals patent basilar artery. The left posterior cerebral artery remains widely patent. Stent is in stable position.  There is coil mass within the aneurysm, with minimal neck residual. There is decreased flow through the right posterior cerebral artery with filling defect in the inferior wall of the right P1 adjacent to the distal aneurysm neck likely representing thrombus. There is decreased perfusion of the right posterior cerebral artery territory in comparison to the prior angiograms. Left vertebral artery, head (post thrombolysis) Angiograms taken 10 minutes after initial 8 mg bolus dose and taken again 10 minutes after the second 8 mg bolus dose sequentially reveal improvement in flow through the right posterior cerebral artery. There is also much smaller filling defect along the wall of the posterior cerebral artery adjacent to the coil mass indicating dissolution of some of the thrombus. The final angiogram does show minimally decreased flow through the right posterior cerebral artery with significantly improved perfusion of the right PCA territory in comparison to the previous angiograms. Venous sinuses remain widely patent. DISPOSITION: Upon completion of the study, the femoral sheath was secured in place with nylon suture and connected to a pressure system. The patient was extubated and transferred to the postanesthesia care unit in stable hemodynamic condition. IMPRESSION: 1. 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 Procedure was complicated by stent thrombosis treated with intra arterial and intravenous Integrilin. The preliminary results of this procedure were shared with the patient's family. Electronically Signed   By: Consuella Lose   On: 11/19/2020 20:28   IR Angiogram Follow Up Study  Result Date: 11/19/2020 PROCEDURE: DIAGNOSTIC CEREBRAL ANGIOGRAM COIL EMBOLIZATION OF BASILAR ANEURYSM HISTORY: The patient is a 60 year old woman presenting to the emergency department with several episodes of diplopia and approximally 1 week of  significantly worsening headache. 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 She was therefore admitted to the hospital. After review of CTA it was felt that she would likely be a candidate for stent supported coiling. It was felt that her aneurysm was likely the cause of her new symptoms likely due to enlargement and irritation of the oculomotor nerve. Patient was loaded with 300 mg of Plavix and 325 mg of aspirin. ACCESS: The technical aspects of the procedure as well as its potential risks and benefits were reviewed with the patient and patient's family. These risks included but were not limited to stroke, intracranial hemorrhage, bleeding, infection, allergic reaction, damage to organs or vital structures, stroke, non-diagnostic procedure, and the catastrophic outcomes of heart attack, coma, and death. With an understanding of these risks, informed consent was obtained and witnessed. The patient was placed in the supine position on the angiography table and the skin of right groin prepped in the usual sterile fashion. The procedure was performed under general anesthesia. A 5-French sheath was introduced in the right common femoral artery using Seldinger technique. MEDICATIONS: HEPARIN: 8000 Units total. INTEGRILIN: 16mg  IA, 3mcg/kg/min IV continuous infusion CONTRAST:  19mL OMNIPAQUE IOHEXOL 300 MG/ML SOLN, 64mL OMNIPAQUE IOHEXOL 300 MG/ML SOLN, 29mL OMNIPAQUE  IOHEXOL 300 MG/ML SOLNcc, Omnipaque 300 FLUOROSCOPY TIME:  FLUOROSCOPY TIME: See IR records TECHNIQUE: CATHETERS AND WIRES 5-French JB-1 catheter 180 cm 0.035" glidewire 6-French NeuronMax guide sheath 6-French Berenstein Select JB-1 catheter 0.070 Sofia guide catheter 0.058" CatV guidecatheter Synchro select standard microwire Synchro select soft microwire As[employer] Chikai 10 microwire Excelsior XT-17 microcatheter x 2 COILS/STENTS USED Target 3D 6 mm x 15 cm Target  360 XL soft 5 mm by 10 cm Target 360 XL 4 mm x 12 cm Target 360 XL 3 mm x 9 cm not deployed Target 360 ultra 2 mm x 4 cm Target 360 ultra 2.5 mm x 4 cm x2 Target 360 ultra 2 mm x 4 cm x2 Neuroform Atlas 3.0 mm x 24 mm VESSELS CATHETERIZED Right internal carotid Left internal carotid Left vertebral Right vertebral Right common femoral Basilar artery Right posterior cerebral artery VESSELS STUDIED Right internal carotid, head Left internal carotid, head Left vertebral Right vertebral Right vertebral artery, head (during embolization) Right vertebral artery, head (post-coiling) Left vertebral artery, head (pre stent) Left vertebral artery, head (immediate post stent) Left vertebral artery, head (during coiling) Left vertebral artery, head (immediate post coiling) Left vertebral artery, head (post thrombolysis) PROCEDURAL NARRATIVE A 5-Fr JB-1 glide catheter was advanced over a 0.035 glidewire into the aortic arch. The above vessels were then sequentially catheterized and cervical / cerebral angiograms taken. After review of images, the catheter was removed without incident. The 5-Fr sheath was then exchanged over the glidewire for an 8-Fr sheath. Under real-time fluoroscopy, the guide sheath was advanced over the select catheter and glidewire into the descending aorta. 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 The guide sheath was then advanced into the proximal right vertebral artery. The Select catheter was removed and the 070 guide catheter was coaxially introduced over both the XT-17 micro catheters and synchro select micro wires. The microcatheters were then advanced under roadmap guidance into the distal cervical vertebral artery. The guide catheter was then tracked over the microcatheter into the distal vertebral artery. The micro catheters were then further advanced into the proximal basilar artery. This allowed  advancement of the guide catheter into the proximal basilar artery. Initially, multiple attempts were made to access the right posterior cerebral artery unsuccessfully. This included a combination of different shape microcatheter tip. I then attempted access with the Chikai 10 microwire again with multiple different tip configurations all unsuccessfully. At this point I elected to attempt to place coils within the aneurysm without the aid of the stent as it did not appear access to the right posterior cerebral artery would be feasible. The three-dimensional coil as well as several filling coils were placed into the aneurysm. There is a small amount of coil loop extending down near the origin of the right posterior cerebral artery. As the last coil was being placed, some coil loops appear to be extending down further to words the origin of the posterior cerebral arteries and I therefore elected to stop coiling at this point. Angiogram was taken and the micro catheters were withdrawn down into the guide catheter. Guide catheter was then withdrawn down into the cervical vertebral artery and angiogram was taken. This did reveal decreased flow through the right posterior cerebral artery suggesting some compromise of the ostium possibly due to coil mass. I therefore elected to attempt to place a stent by accessing the left vertebral artery. The guide catheter was removed, and the  guide sheath was withdrawn down into the descending aorta as was the guide sheath. The select catheter was introduced over the Glidewire, and the left subclavian followed by the proximal left vertebral artery were selected. Guide sheath was then advanced into the proximal left vertebral artery. Under roadmap guidance, the 058 CT 5 guide catheter was introduced over and XT 17 microcatheter and synchro select microwire. These were then navigated until the CAT 5 guide catheter was in the proximal basilar artery. The synchro select microwire was then  used to access the right posterior cerebral artery. I was able to gain access to this vessel by buttressing against the coil mass already placed in the aneurysm. Microcatheter was then tracked into the P2 segment. Microwire was removed, and the above stent was introduced. Stent was then deployed under roadmap guidance without incident. The microcatheter was then advanced over the pushing wire into the lumen of the stent. Angiogram was then taken. It did appear that flow through the right posterior cerebral artery was improved in comparison to the prior angiogram. With the posterior cerebral artery somewhat protected, we elected to attempt to coil more of the residual aneurysm. The microwire was introduced into the microcatheter, and advanced into the lumen of the aneurysm through the stent. Microcatheter was then advanced into the residual aneurysm. The remainder of the above coils were then sequentially placed. After several coils were placed, I elected to complete the coiling procedure as I did not want to risk coil herniation through the tines of the stent and further compromise the posterior cerebral arteries. The coiling catheter was withdrawn, and the guide catheter was withdrawn down into the cervical vertebral artery. At this point, angiogram was taken which appear to reveal more decreased flow in the right posterior cerebral artery and what appear to be filling defect in the inferior wall of the right posterior cerebral artery adjacent to the coil mass. Therefore under roadmap guidance, I advanced the microcatheter over the microwire back into the lumen of the stent. I then administered a 8 mg bolus dose of Integrilin intra-arterially. After approximately 10 minutes, angiogram was taken which did reveal somewhat increased flow through the right posterior cerebral artery and better perfusion. I then administered another 8 mg bolus dose intra-arterially. After another 10 minutes, another angiogram was taken  again revealing improved flow through the right posterior cerebral artery. At this point, the pharmacy was able to deliver intravenous Integrilin which was started at a rate of 2 micrograms/kg/minute with the plan to continue for 18 hours. With improved flow in the right posterior cerebral artery, I elected to complete the procedure at this point. The guide catheter and guide sheath were then synchronously removed without incident. FINDINGS: Right internal carotid, head: Injection reveals the presence of a widely patent ICA, M1, and A1 segments and their branches. No aneurysms, AVMs, or high-flow fistulas are seen. Of note, the posterior communicating artery is very small and no visualization of the posterior circulation is noted. The parenchymal and venous phases are normal. The venous sinuses are widely patent. Left internal carotid, head: Injection reveals the presence of a widely patent ICA, A1, and M1 segments and their branches. No aneurysms, AVMs, or high-flow fistulas are seen. Similar to the contralateral side, there is no significant visualization of the left posterior communicating artery. The parenchymal and venous phases are normal. The venous sinuses are widely patent. Left vertebral: Injection reveals the presence of a widely patent vertebral artery. This leads to a widely patent basilar  artery that terminates in bilateral P1. 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 This aneurysm measures approximately 6.8 x 5.8 mm with a neck measuring approximately 4.5 mm. The parenchymal and venous phases are normal. The venous sinuses are widely patent. Right vertebral: The vertebral artery is widely patent. No PICA aneurysm is seen. See basilar description above. Right vertebral artery, head (during embolization) Angiograms taken during coiling of the basilar aneurysm through the right vertebral artery  access demonstrate progressive occlusion primarily of the dome of the aneurysm. The right vertebral artery and bilateral posterior cerebral arteries appear to be widely patent. There is residual aneurysm both at the neck and centrally within the aneurysm. No filling defects are noted. Right vertebral artery, head (post coiling) Angiogram taken after coiling through the right vertebral artery reveal occlusion of the dome of the aneurysm although there is residual aneurysm centrally and at the neck. There are small coil loops which appear to be in proximity to the origin of the right posterior cerebral artery. No filling defects are seen however flow through the right posterior cerebral artery is delayed in comparison to the left PCA, as well as in comparison to earlier angiograms suggesting flow limitation. Left vertebral artery, head (pre stent) Angiogram taken through the guide catheter in the left vertebral artery again demonstrates coil mass within the aneurysm with the residual filling as described above. Again seen is delayed filling of the right posterior cerebral artery with decreased perfusion in comparison to prior angiograms. Again, no filling defects are seen to suggest thrombus. Left vertebral artery, head (immediate post stent) Angiogram reveals widely patent left vertebral artery with stent extending from the mid basilar into the right P2 segment. No filling defects are seen. Flow within the right posterior cerebral artery appears to be improved in comparison to the pre stent angiogram with more symmetric filling of the bilateral posterior cerebral arteries. Left vertebral artery, head (during coiling) Angiogram reveals further coil mass within the residual aneurysm which is progressively occluded. No filling defects are noted. The posterior cerebral arteries remain patent. Left vertebral artery, head (immediate post coiling) Angiogram taken from the left vertebral artery reveals patent basilar artery.  The left posterior cerebral artery remains widely patent. Stent is in stable position. There is coil mass within the aneurysm, with minimal neck residual. There is decreased flow through the right posterior cerebral artery with filling defect in the inferior wall of the right P1 adjacent to the distal aneurysm neck likely representing thrombus. There is decreased perfusion of the right posterior cerebral artery territory in comparison to the prior angiograms. Left vertebral artery, head (post thrombolysis) Angiograms taken 10 minutes after initial 8 mg bolus dose and taken again 10 minutes after the second 8 mg bolus dose sequentially reveal improvement in flow through the right posterior cerebral artery. There is also much smaller filling defect along the wall of the posterior cerebral artery adjacent to the coil mass indicating dissolution of some of the thrombus. The final angiogram does show minimally decreased flow through the right posterior cerebral artery with significantly improved perfusion of the right PCA territory in comparison to the previous angiograms. Venous sinuses remain widely patent. DISPOSITION: Upon completion of the study, the femoral sheath was secured in place with nylon suture and connected to a pressure system. The patient was extubated and transferred to the postanesthesia care unit in stable hemodynamic condition. IMPRESSION: 1. 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 Procedure was complicated by stent thrombosis treated with intra arterial and intravenous Integrilin. The preliminary results of this procedure were shared with the patient's family. Electronically Signed   By: Consuella Lose   On: 11/19/2020 20:28   IR Intra Cran Stent  Result Date: 11/19/2020 PROCEDURE: DIAGNOSTIC CEREBRAL ANGIOGRAM COIL EMBOLIZATION OF BASILAR ANEURYSM HISTORY: The patient is a 60 year old woman presenting to the emergency  department with several episodes of diplopia and approximally 1 week of significantly worsening headache. 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 She was therefore admitted to the hospital. After review of CTA it was felt that she would likely be a candidate for stent supported coiling. It was felt that her aneurysm was likely the cause of her new symptoms likely due to enlargement and irritation of the oculomotor nerve. Patient was loaded with 300 mg of Plavix and 325 mg of aspirin. ACCESS: The technical aspects of the procedure as well as its potential risks and benefits were reviewed with the patient and patient's family. These risks included but were not limited to stroke, intracranial hemorrhage, bleeding, infection, allergic reaction, damage to organs or vital structures, stroke, non-diagnostic procedure, and the catastrophic outcomes of heart attack, coma, and death. With an understanding of these risks, informed consent was obtained and witnessed. The patient was placed in the supine position on the angiography table and the skin of right groin prepped in the usual sterile fashion. The procedure was performed under general anesthesia. A 5-French sheath was introduced in the right common femoral artery using Seldinger technique. MEDICATIONS: HEPARIN: 8000 Units total. INTEGRILIN: 16mg  IA, 6mcg/kg/min IV continuous infusion CONTRAST:  1mL OMNIPAQUE IOHEXOL 300 MG/ML SOLN, 37mL OMNIPAQUE IOHEXOL 300 MG/ML SOLN, 78mL OMNIPAQUE IOHEXOL 300 MG/ML SOLNcc, Omnipaque 300 FLUOROSCOPY TIME:  FLUOROSCOPY TIME: See IR records TECHNIQUE: CATHETERS AND WIRES 5-French JB-1 catheter 180 cm 0.035" glidewire 6-French NeuronMax guide sheath 6-French Berenstein Select JB-1 catheter 0.070 Sofia guide catheter 0.058" CatV guidecatheter Synchro select standard microwire Synchro select soft microwire Asahi Chikai 10 microwire Excelsior  XT-17 microcatheter x 2 COILS/STENTS USED Target 3D 6 mm x 15 cm Target 360 XL soft 5 mm by 10 cm Target 360 XL 4 mm x 12 cm Target 360 XL 3 mm x 9 cm not deployed Target 360 ultra 2 mm x 4 cm Target 360 ultra 2.5 mm x 4 cm x2 Target 360 ultra 2 mm x 4 cm x2 Neuroform Atlas 3.0 mm x 24 mm VESSELS CATHETERIZED Right internal carotid Left internal carotid Left vertebral Right vertebral Right common femoral Basilar artery Right posterior cerebral artery VESSELS STUDIED Right internal carotid, head Left internal carotid, head Left vertebral Right vertebral Right vertebral artery, head (during embolization) Right vertebral artery, head (post-coiling) Left vertebral artery, head (pre stent) Left vertebral artery, head (immediate post stent) Left vertebral artery, head (during coiling) Left vertebral artery, head (immediate post coiling) Left vertebral artery, head (post thrombolysis) PROCEDURAL NARRATIVE A 5-Fr JB-1 glide catheter was advanced over a 0.035 glidewire into the aortic arch. The above vessels were then sequentially catheterized and cervical / cerebral angiograms taken. After review of images, the catheter was removed without incident. The 5-Fr sheath was then exchanged over the glidewire for an 8-Fr sheath. Under real-time fluoroscopy, the guide sheath was advanced over the select catheter and glidewire into the descending aorta. 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 The guide sheath was then advanced into the proximal right vertebral artery. The Select catheter was removed and the 070 guide catheter was coaxially introduced over both the XT-17 micro catheters and synchro select micro wires. The microcatheters were then advanced under roadmap guidance into the distal cervical vertebral artery. The guide catheter was then tracked over the microcatheter into the distal vertebral artery. The micro catheters were  then further advanced into the proximal basilar artery. This allowed advancement of the guide catheter into the proximal basilar artery. Initially, multiple attempts were made to access the right posterior cerebral artery unsuccessfully. This included a combination of different shape microcatheter tip. I then attempted access with the Chikai 10 microwire again with multiple different tip configurations all unsuccessfully. At this point I elected to attempt to place coils within the aneurysm without the aid of the stent as it did not appear access to the right posterior cerebral artery would be feasible. The three-dimensional coil as well as several filling coils were placed into the aneurysm. There is a small amount of coil loop extending down near the origin of the right posterior cerebral artery. As the last coil was being placed, some coil loops appear to be extending down further to words the origin of the posterior cerebral arteries and I therefore elected to stop coiling at this point. Angiogram was taken and the micro catheters were withdrawn down into the guide catheter. Guide catheter was then withdrawn down into the cervical vertebral artery and angiogram was taken. This did reveal decreased flow through the right posterior cerebral artery suggesting some compromise of the ostium possibly due to coil mass. I therefore elected to attempt to place a stent by accessing the left vertebral artery. The guide catheter was removed, and the guide sheath was withdrawn down into the descending aorta as was the guide sheath. The select catheter was introduced over the Glidewire, and the left subclavian followed by the proximal left vertebral artery were selected. Guide sheath was then advanced into the proximal left vertebral artery. Under roadmap guidance, the 058 CT 5 guide catheter was introduced over and XT 17 microcatheter and synchro select microwire. These were then navigated until the CAT 5 guide catheter was  in the proximal basilar artery. The synchro select microwire was then used to access the right posterior cerebral artery. I was able to gain access to this vessel by buttressing against the coil mass already placed in the aneurysm. Microcatheter was then tracked into the P2 segment. Microwire was removed, and the above stent was introduced. Stent was then deployed under roadmap guidance without incident. The microcatheter was then advanced over the pushing wire into the lumen of the stent. Angiogram was then taken. It did appear that flow through the right posterior cerebral artery was improved in comparison to the prior angiogram. With the posterior cerebral artery somewhat protected, we elected to attempt to coil more of the residual aneurysm. The microwire was introduced into the microcatheter, and advanced into the lumen of the aneurysm through the stent. Microcatheter was then advanced into the residual aneurysm. The remainder of the above coils were then sequentially placed. After several coils were placed, I elected to complete the coiling procedure as I did not want to risk coil herniation through the tines of the stent and further compromise the posterior cerebral arteries. The coiling catheter was withdrawn, and the guide catheter was  withdrawn down into the cervical vertebral artery. At this point, angiogram was taken which appear to reveal more decreased flow in the right posterior cerebral artery and what appear to be filling defect in the inferior wall of the right posterior cerebral artery adjacent to the coil mass. Therefore under roadmap guidance, I advanced the microcatheter over the microwire back into the lumen of the stent. I then administered a 8 mg bolus dose of Integrilin intra-arterially. After approximately 10 minutes, angiogram was taken which did reveal somewhat increased flow through the right posterior cerebral artery and better perfusion. I then administered another 8 mg bolus dose  intra-arterially. After another 10 minutes, another angiogram was taken again revealing improved flow through the right posterior cerebral artery. At this point, the pharmacy was able to deliver intravenous Integrilin which was started at a rate of 2 micrograms/kg/minute with the plan to continue for 18 hours. With improved flow in the right posterior cerebral artery, I elected to complete the procedure at this point. The guide catheter and guide sheath were then synchronously removed without incident. FINDINGS: Right internal carotid, head: Injection reveals the presence of a widely patent ICA, M1, and A1 segments and their branches. No aneurysms, AVMs, or high-flow fistulas are seen. Of note, the posterior communicating artery is very small and no visualization of the posterior circulation is noted. The parenchymal and venous phases are normal. The venous sinuses are widely patent. Left internal carotid, head: Injection reveals the presence of a widely patent ICA, A1, and M1 segments and their branches. No aneurysms, AVMs, or high-flow fistulas are seen. Similar to the contralateral side, there is no significant visualization of the left posterior communicating artery. The parenchymal and venous phases are normal. The venous sinuses are widely patent. Left vertebral: Injection reveals the presence of a widely patent vertebral artery. This leads to a widely patent basilar artery that terminates in bilateral P1. 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 This aneurysm measures approximately 6.8 x 5.8 mm with a neck measuring approximately 4.5 mm. The parenchymal and venous phases are normal. The venous sinuses are widely patent. Right vertebral: The vertebral artery is widely patent. No PICA aneurysm is seen. See basilar description above. Right vertebral artery, head (during embolization) Angiograms taken  during coiling of the basilar aneurysm through the right vertebral artery access demonstrate progressive occlusion primarily of the dome of the aneurysm. The right vertebral artery and bilateral posterior cerebral arteries appear to be widely patent. There is residual aneurysm both at the neck and centrally within the aneurysm. No filling defects are noted. Right vertebral artery, head (post coiling) Angiogram taken after coiling through the right vertebral artery reveal occlusion of the dome of the aneurysm although there is residual aneurysm centrally and at the neck. There are small coil loops which appear to be in proximity to the origin of the right posterior cerebral artery. No filling defects are seen however flow through the right posterior cerebral artery is delayed in comparison to the left PCA, as well as in comparison to earlier angiograms suggesting flow limitation. Left vertebral artery, head (pre stent) Angiogram taken through the guide catheter in the left vertebral artery again demonstrates coil mass within the aneurysm with the residual filling as described above. Again seen is delayed filling of the right posterior cerebral artery with decreased perfusion in comparison to prior angiograms. Again, no filling defects are seen to  suggest thrombus. Left vertebral artery, head (immediate post stent) Angiogram reveals widely patent left vertebral artery with stent extending from the mid basilar into the right P2 segment. No filling defects are seen. Flow within the right posterior cerebral artery appears to be improved in comparison to the pre stent angiogram with more symmetric filling of the bilateral posterior cerebral arteries. Left vertebral artery, head (during coiling) Angiogram reveals further coil mass within the residual aneurysm which is progressively occluded. No filling defects are noted. The posterior cerebral arteries remain patent. Left vertebral artery, head (immediate post coiling)  Angiogram taken from the left vertebral artery reveals patent basilar artery. The left posterior cerebral artery remains widely patent. Stent is in stable position. There is coil mass within the aneurysm, with minimal neck residual. There is decreased flow through the right posterior cerebral artery with filling defect in the inferior wall of the right P1 adjacent to the distal aneurysm neck likely representing thrombus. There is decreased perfusion of the right posterior cerebral artery territory in comparison to the prior angiograms. Left vertebral artery, head (post thrombolysis) Angiograms taken 10 minutes after initial 8 mg bolus dose and taken again 10 minutes after the second 8 mg bolus dose sequentially reveal improvement in flow through the right posterior cerebral artery. There is also much smaller filling defect along the wall of the posterior cerebral artery adjacent to the coil mass indicating dissolution of some of the thrombus. The final angiogram does show minimally decreased flow through the right posterior cerebral artery with significantly improved perfusion of the right PCA territory in comparison to the previous angiograms. Venous sinuses remain widely patent. DISPOSITION: Upon completion of the study, the femoral sheath was secured in place with nylon suture and connected to a pressure system. The patient was extubated and transferred to the postanesthesia care unit in stable hemodynamic condition. IMPRESSION: 1. 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 Procedure was complicated by stent thrombosis treated with intra arterial and intravenous Integrilin. The preliminary results of this procedure were shared with the patient's family. Electronically Signed   By: Consuella Lose   On: 11/19/2020 20:28   IR ANGIO INTRA EXTRACRAN SEL INTERNAL CAROTID BILAT MOD SED  Result Date: 11/19/2020 PROCEDURE: DIAGNOSTIC CEREBRAL  ANGIOGRAM COIL EMBOLIZATION OF BASILAR ANEURYSM HISTORY: The patient is a 60 year old woman presenting to the emergency department with several episodes of diplopia and approximally 1 week of significantly worsening headache. 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 She was therefore admitted to the hospital. After review of CTA it was felt that she would likely be a candidate for stent supported coiling. It was felt that her aneurysm was likely the cause of her new symptoms likely due to enlargement and irritation of the oculomotor nerve. Patient was loaded with 300 mg of Plavix and 325 mg of aspirin. ACCESS: The technical aspects of the procedure as well as its potential risks and benefits were reviewed with the patient and patient's family. These risks included but were not limited to stroke, intracranial hemorrhage, bleeding, infection, allergic reaction, damage to organs or vital structures, stroke, non-diagnostic procedure, and the catastrophic outcomes of heart attack, coma, and death. With an understanding of these risks, informed consent was obtained and witnessed. The patient was placed in the supine position on the angiography table and the skin of right groin prepped in the usual sterile fashion. The  procedure was performed under general anesthesia. A 5-French sheath was introduced in the right common femoral artery using Seldinger technique. MEDICATIONS: HEPARIN: 8000 Units total. INTEGRILIN: 16mg  IA, 104mcg/kg/min IV continuous infusion CONTRAST:  54mL OMNIPAQUE IOHEXOL 300 MG/ML SOLN, 8mL OMNIPAQUE IOHEXOL 300 MG/ML SOLN, 26mL OMNIPAQUE IOHEXOL 300 MG/ML SOLNcc, Omnipaque 300 FLUOROSCOPY TIME:  FLUOROSCOPY TIME: See IR records TECHNIQUE: CATHETERS AND WIRES 5-French JB-1 catheter 180 cm 0.035" glidewire 6-French NeuronMax guide sheath 6-French Berenstein Select JB-1 catheter 0.070 Sofia guide catheter  0.058" CatV guidecatheter Synchro select standard microwire Synchro select soft microwire As[employer] Chikai 10 microwire Excelsior XT-17 microcatheter x 2 COILS/STENTS USED Target 3D 6 mm x 15 cm Target 360 XL soft 5 mm by 10 cm Target 360 XL 4 mm x 12 cm Target 360 XL 3 mm x 9 cm not deployed Target 360 ultra 2 mm x 4 cm Target 360 ultra 2.5 mm x 4 cm x2 Target 360 ultra 2 mm x 4 cm x2 Neuroform Atlas 3.0 mm x 24 mm VESSELS CATHETERIZED Right internal carotid Left internal carotid Left vertebral Right vertebral Right common femoral Basilar artery Right posterior cerebral artery VESSELS STUDIED Right internal carotid, head Left internal carotid, head Left vertebral Right vertebral Right vertebral artery, head (during embolization) Right vertebral artery, head (post-coiling) Left vertebral artery, head (pre stent) Left vertebral artery, head (immediate post stent) Left vertebral artery, head (during coiling) Left vertebral artery, head (immediate post coiling) Left vertebral artery, head (post thrombolysis) PROCEDURAL NARRATIVE A 5-Fr JB-1 glide catheter was advanced over a 0.035 glidewire into the aortic arch. The above vessels were then sequentially catheterized and cervical / cerebral angiograms taken. After review of images, the catheter was removed without incident. The 5-Fr sheath was then exchanged over the glidewire for an 8-Fr sheath. Under real-time fluoroscopy, the guide sheath was advanced over the select catheter and glidewire into the descending aorta. 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 The guide sheath was then advanced into the proximal right vertebral artery. The Select catheter was removed and the 070 guide catheter was coaxially introduced over both the XT-17 micro catheters and synchro select micro wires. The microcatheters were then advanced under roadmap guidance into the distal cervical vertebral  artery. The guide catheter was then tracked over the microcatheter into the distal vertebral artery. The micro catheters were then further advanced into the proximal basilar artery. This allowed advancement of the guide catheter into the proximal basilar artery. Initially, multiple attempts were made to access the right posterior cerebral artery unsuccessfully. This included a combination of different shape microcatheter tip. I then attempted access with the Chikai 10 microwire again with multiple different tip configurations all unsuccessfully. At this point I elected to attempt to place coils within the aneurysm without the aid of the stent as it did not appear access to the right posterior cerebral artery would be feasible. The three-dimensional coil as well as several filling coils were placed into the aneurysm. There is a small amount of coil loop extending down near the origin of the right posterior cerebral artery. As the last coil was being placed, some coil loops appear to be extending down further to words the origin of the posterior cerebral arteries and I therefore elected to stop coiling at this point. Angiogram was taken and the micro catheters were withdrawn down into the guide catheter. Guide catheter was then withdrawn down into the cervical vertebral artery  and angiogram was taken. This did reveal decreased flow through the right posterior cerebral artery suggesting some compromise of the ostium possibly due to coil mass. I therefore elected to attempt to place a stent by accessing the left vertebral artery. The guide catheter was removed, and the guide sheath was withdrawn down into the descending aorta as was the guide sheath. The select catheter was introduced over the Glidewire, and the left subclavian followed by the proximal left vertebral artery were selected. Guide sheath was then advanced into the proximal left vertebral artery. Under roadmap guidance, the 058 CT 5 guide catheter was  introduced over and XT 17 microcatheter and synchro select microwire. These were then navigated until the CAT 5 guide catheter was in the proximal basilar artery. The synchro select microwire was then used to access the right posterior cerebral artery. I was able to gain access to this vessel by buttressing against the coil mass already placed in the aneurysm. Microcatheter was then tracked into the P2 segment. Microwire was removed, and the above stent was introduced. Stent was then deployed under roadmap guidance without incident. The microcatheter was then advanced over the pushing wire into the lumen of the stent. Angiogram was then taken. It did appear that flow through the right posterior cerebral artery was improved in comparison to the prior angiogram. With the posterior cerebral artery somewhat protected, we elected to attempt to coil more of the residual aneurysm. The microwire was introduced into the microcatheter, and advanced into the lumen of the aneurysm through the stent. Microcatheter was then advanced into the residual aneurysm. The remainder of the above coils were then sequentially placed. After several coils were placed, I elected to complete the coiling procedure as I did not want to risk coil herniation through the tines of the stent and further compromise the posterior cerebral arteries. The coiling catheter was withdrawn, and the guide catheter was withdrawn down into the cervical vertebral artery. At this point, angiogram was taken which appear to reveal more decreased flow in the right posterior cerebral artery and what appear to be filling defect in the inferior wall of the right posterior cerebral artery adjacent to the coil mass. Therefore under roadmap guidance, I advanced the microcatheter over the microwire back into the lumen of the stent. I then administered a 8 mg bolus dose of Integrilin intra-arterially. After approximately 10 minutes, angiogram was taken which did reveal  somewhat increased flow through the right posterior cerebral artery and better perfusion. I then administered another 8 mg bolus dose intra-arterially. After another 10 minutes, another angiogram was taken again revealing improved flow through the right posterior cerebral artery. At this point, the pharmacy was able to deliver intravenous Integrilin which was started at a rate of 2 micrograms/kg/minute with the plan to continue for 18 hours. With improved flow in the right posterior cerebral artery, I elected to complete the procedure at this point. The guide catheter and guide sheath were then synchronously removed without incident. FINDINGS: Right internal carotid, head: Injection reveals the presence of a widely patent ICA, M1, and A1 segments and their branches. No aneurysms, AVMs, or high-flow fistulas are seen. Of note, the posterior communicating artery is very small and no visualization of the posterior circulation is noted. The parenchymal and venous phases are normal. The venous sinuses are widely patent. Left internal carotid, head: Injection reveals the presence of a widely patent ICA, A1, and M1 segments and their branches. No aneurysms, AVMs, or high-flow fistulas are seen.  Similar to the contralateral side, there is no significant visualization of the left posterior communicating artery. The parenchymal and venous phases are normal. The venous sinuses are widely patent. Left vertebral: Injection reveals the presence of a widely patent vertebral artery. This leads to a widely patent basilar artery that terminates in bilateral P1. 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 This aneurysm measures approximately 6.8 x 5.8 mm with a neck measuring approximately 4.5 mm. The parenchymal and venous phases are normal. The venous sinuses are widely patent. Right vertebral: The vertebral artery is widely  patent. No PICA aneurysm is seen. See basilar description above. Right vertebral artery, head (during embolization) Angiograms taken during coiling of the basilar aneurysm through the right vertebral artery access demonstrate progressive occlusion primarily of the dome of the aneurysm. The right vertebral artery and bilateral posterior cerebral arteries appear to be widely patent. There is residual aneurysm both at the neck and centrally within the aneurysm. No filling defects are noted. Right vertebral artery, head (post coiling) Angiogram taken after coiling through the right vertebral artery reveal occlusion of the dome of the aneurysm although there is residual aneurysm centrally and at the neck. There are small coil loops which appear to be in proximity to the origin of the right posterior cerebral artery. No filling defects are seen however flow through the right posterior cerebral artery is delayed in comparison to the left PCA, as well as in comparison to earlier angiograms suggesting flow limitation. Left vertebral artery, head (pre stent) Angiogram taken through the guide catheter in the left vertebral artery again demonstrates coil mass within the aneurysm with the residual filling as described above. Again seen is delayed filling of the right posterior cerebral artery with decreased perfusion in comparison to prior angiograms. Again, no filling defects are seen to suggest thrombus. Left vertebral artery, head (immediate post stent) Angiogram reveals widely patent left vertebral artery with stent extending from the mid basilar into the right P2 segment. No filling defects are seen. Flow within the right posterior cerebral artery appears to be improved in comparison to the pre stent angiogram with more symmetric filling of the bilateral posterior cerebral arteries. Left vertebral artery, head (during coiling) Angiogram reveals further coil mass within the residual aneurysm which is progressively occluded.  No filling defects are noted. The posterior cerebral arteries remain patent. Left vertebral artery, head (immediate post coiling) Angiogram taken from the left vertebral artery reveals patent basilar artery. The left posterior cerebral artery remains widely patent. Stent is in stable position. There is coil mass within the aneurysm, with minimal neck residual. There is decreased flow through the right posterior cerebral artery with filling defect in the inferior wall of the right P1 adjacent to the distal aneurysm neck likely representing thrombus. There is decreased perfusion of the right posterior cerebral artery territory in comparison to the prior angiograms. Left vertebral artery, head (post thrombolysis) Angiograms taken 10 minutes after initial 8 mg bolus dose and taken again 10 minutes after the second 8 mg bolus dose sequentially reveal improvement in flow through the right posterior cerebral artery. There is also much smaller filling defect along the wall of the posterior cerebral artery adjacent to the coil mass indicating dissolution of some of the thrombus. The final angiogram does show minimally decreased flow through the right posterior cerebral artery with significantly improved perfusion of the right PCA territory in comparison to  the previous angiograms. Venous sinuses remain widely patent. DISPOSITION: Upon completion of the study, the femoral sheath was secured in place with nylon suture and connected to a pressure system. The patient was extubated and transferred to the postanesthesia care unit in stable hemodynamic condition. IMPRESSION: 1. 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 Procedure was complicated by stent thrombosis treated with intra arterial and intravenous Integrilin. The preliminary results of this procedure were shared with the patient's family. Electronically Signed   By: Consuella Lose   On: 11/19/2020  20:28   IR ANGIO VERTEBRAL SEL VERTEBRAL BILAT MOD SED  Result Date: 11/19/2020 PROCEDURE: DIAGNOSTIC CEREBRAL ANGIOGRAM COIL EMBOLIZATION OF BASILAR ANEURYSM HISTORY: The patient is a 60 year old woman presenting to the emergency department with several episodes of diplopia and approximally 1 week of significantly worsening headache. 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 She was therefore admitted to the hospital. After review of CTA it was felt that she would likely be a candidate for stent supported coiling. It was felt that her aneurysm was likely the cause of her new symptoms likely due to enlargement and irritation of the oculomotor nerve. Patient was loaded with 300 mg of Plavix and 325 mg of aspirin. ACCESS: The technical aspects of the procedure as well as its potential risks and benefits were reviewed with the patient and patient's family. These risks included but were not limited to stroke, intracranial hemorrhage, bleeding, infection, allergic reaction, damage to organs or vital structures, stroke, non-diagnostic procedure, and the catastrophic outcomes of heart attack, coma, and death. With an understanding of these risks, informed consent was obtained and witnessed. The patient was placed in the supine position on the angiography table and the skin of right groin prepped in the usual sterile fashion. The procedure was performed under general anesthesia. A 5-French sheath was introduced in the right common femoral artery using Seldinger technique. MEDICATIONS: HEPARIN: 8000 Units total. INTEGRILIN: 16mg  IA, 14mcg/kg/min IV continuous infusion CONTRAST:  23mL OMNIPAQUE IOHEXOL 300 MG/ML SOLN, 44mL OMNIPAQUE IOHEXOL 300 MG/ML SOLN, 90mL OMNIPAQUE IOHEXOL 300 MG/ML SOLNcc, Omnipaque 300 FLUOROSCOPY TIME:  FLUOROSCOPY TIME: See IR records TECHNIQUE: CATHETERS AND WIRES 5-French JB-1 catheter 180 cm 0.035"  glidewire 6-French NeuronMax guide sheath 6-French Berenstein Select JB-1 catheter 0.070 Sofia guide catheter 0.058" CatV guidecatheter Synchro select standard microwire Synchro select soft microwire As[employer] Chikai 10 microwire Excelsior XT-17 microcatheter x 2 COILS/STENTS USED Target 3D 6 mm x 15 cm Target 360 XL soft 5 mm by 10 cm Target 360 XL 4 mm x 12 cm Target 360 XL 3 mm x 9 cm not deployed Target 360 ultra 2 mm x 4 cm Target 360 ultra 2.5 mm x 4 cm x2 Target 360 ultra 2 mm x 4 cm x2 Neuroform Atlas 3.0 mm x 24 mm VESSELS CATHETERIZED Right internal carotid Left internal carotid Left vertebral Right vertebral Right common femoral Basilar artery Right posterior cerebral artery VESSELS STUDIED Right internal carotid, head Left internal carotid, head Left vertebral Right vertebral Right vertebral artery, head (during embolization) Right vertebral artery, head (post-coiling) Left vertebral artery, head (pre stent) Left vertebral artery, head (immediate post stent) Left vertebral artery, head (during coiling) Left vertebral artery, head (immediate post coiling) Left vertebral artery, head (post thrombolysis) PROCEDURAL NARRATIVE A 5-Fr JB-1 glide catheter was advanced over a 0.035 glidewire into the aortic arch. The above vessels were  then sequentially catheterized and cervical / cerebral angiograms taken. After review of images, the catheter was removed without incident. The 5-Fr sheath was then exchanged over the glidewire for an 8-Fr sheath. Under real-time fluoroscopy, the guide sheath was advanced over the select catheter and glidewire into the descending aorta. 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 The guide sheath was then advanced into the proximal right vertebral artery. The Select catheter was removed and the 070 guide catheter was coaxially introduced over both the XT-17 micro catheters and synchro  select micro wires. The microcatheters were then advanced under roadmap guidance into the distal cervical vertebral artery. The guide catheter was then tracked over the microcatheter into the distal vertebral artery. The micro catheters were then further advanced into the proximal basilar artery. This allowed advancement of the guide catheter into the proximal basilar artery. Initially, multiple attempts were made to access the right posterior cerebral artery unsuccessfully. This included a combination of different shape microcatheter tip. I then attempted access with the Chikai 10 microwire again with multiple different tip configurations all unsuccessfully. At this point I elected to attempt to place coils within the aneurysm without the aid of the stent as it did not appear access to the right posterior cerebral artery would be feasible. The three-dimensional coil as well as several filling coils were placed into the aneurysm. There is a small amount of coil loop extending down near the origin of the right posterior cerebral artery. As the last coil was being placed, some coil loops appear to be extending down further to words the origin of the posterior cerebral arteries and I therefore elected to stop coiling at this point. Angiogram was taken and the micro catheters were withdrawn down into the guide catheter. Guide catheter was then withdrawn down into the cervical vertebral artery and angiogram was taken. This did reveal decreased flow through the right posterior cerebral artery suggesting some compromise of the ostium possibly due to coil mass. I therefore elected to attempt to place a stent by accessing the left vertebral artery. The guide catheter was removed, and the guide sheath was withdrawn down into the descending aorta as was the guide sheath. The select catheter was introduced over the Glidewire, and the left subclavian followed by the proximal left vertebral artery were selected. Guide sheath was  then advanced into the proximal left vertebral artery. Under roadmap guidance, the 058 CT 5 guide catheter was introduced over and XT 17 microcatheter and synchro select microwire. These were then navigated until the CAT 5 guide catheter was in the proximal basilar artery. The synchro select microwire was then used to access the right posterior cerebral artery. I was able to gain access to this vessel by buttressing against the coil mass already placed in the aneurysm. Microcatheter was then tracked into the P2 segment. Microwire was removed, and the above stent was introduced. Stent was then deployed under roadmap guidance without incident. The microcatheter was then advanced over the pushing wire into the lumen of the stent. Angiogram was then taken. It did appear that flow through the right posterior cerebral artery was improved in comparison to the prior angiogram. With the posterior cerebral artery somewhat protected, we elected to attempt to coil more of the residual aneurysm. The microwire was introduced into the microcatheter, and advanced into the lumen of the aneurysm through the stent. Microcatheter was then advanced into the residual aneurysm. The remainder  of the above coils were then sequentially placed. After several coils were placed, I elected to complete the coiling procedure as I did not want to risk coil herniation through the tines of the stent and further compromise the posterior cerebral arteries. The coiling catheter was withdrawn, and the guide catheter was withdrawn down into the cervical vertebral artery. At this point, angiogram was taken which appear to reveal more decreased flow in the right posterior cerebral artery and what appear to be filling defect in the inferior wall of the right posterior cerebral artery adjacent to the coil mass. Therefore under roadmap guidance, I advanced the microcatheter over the microwire back into the lumen of the stent. I then administered a 8 mg bolus  dose of Integrilin intra-arterially. After approximately 10 minutes, angiogram was taken which did reveal somewhat increased flow through the right posterior cerebral artery and better perfusion. I then administered another 8 mg bolus dose intra-arterially. After another 10 minutes, another angiogram was taken again revealing improved flow through the right posterior cerebral artery. At this point, the pharmacy was able to deliver intravenous Integrilin which was started at a rate of 2 micrograms/kg/minute with the plan to continue for 18 hours. With improved flow in the right posterior cerebral artery, I elected to complete the procedure at this point. The guide catheter and guide sheath were then synchronously removed without incident. FINDINGS: Right internal carotid, head: Injection reveals the presence of a widely patent ICA, M1, and A1 segments and their branches. No aneurysms, AVMs, or high-flow fistulas are seen. Of note, the posterior communicating artery is very small and no visualization of the posterior circulation is noted. The parenchymal and venous phases are normal. The venous sinuses are widely patent. Left internal carotid, head: Injection reveals the presence of a widely patent ICA, A1, and M1 segments and their branches. No aneurysms, AVMs, or high-flow fistulas are seen. Similar to the contralateral side, there is no significant visualization of the left posterior communicating artery. The parenchymal and venous phases are normal. The venous sinuses are widely patent. Left vertebral: Injection reveals the presence of a widely patent vertebral artery. This leads to a widely patent basilar artery that terminates in bilateral P1. 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 This aneurysm measures approximately 6.8 x 5.8 mm with a neck measuring approximately 4.5 mm. The parenchymal and venous  phases are normal. The venous sinuses are widely patent. Right vertebral: The vertebral artery is widely patent. No PICA aneurysm is seen. See basilar description above. Right vertebral artery, head (during embolization) Angiograms taken during coiling of the basilar aneurysm through the right vertebral artery access demonstrate progressive occlusion primarily of the dome of the aneurysm. The right vertebral artery and bilateral posterior cerebral arteries appear to be widely patent. There is residual aneurysm both at the neck and centrally within the aneurysm. No filling defects are noted. Right vertebral artery, head (post coiling) Angiogram taken after coiling through the right vertebral artery reveal occlusion of the dome of the aneurysm although there is residual aneurysm centrally and at the neck. There are small coil loops which appear to be in proximity to the origin of the right posterior cerebral artery. No filling defects are seen however flow through the right posterior cerebral artery is delayed in comparison to the left PCA, as well as in comparison to earlier angiograms suggesting flow limitation. Left vertebral artery, head (pre  stent) Angiogram taken through the guide catheter in the left vertebral artery again demonstrates coil mass within the aneurysm with the residual filling as described above. Again seen is delayed filling of the right posterior cerebral artery with decreased perfusion in comparison to prior angiograms. Again, no filling defects are seen to suggest thrombus. Left vertebral artery, head (immediate post stent) Angiogram reveals widely patent left vertebral artery with stent extending from the mid basilar into the right P2 segment. No filling defects are seen. Flow within the right posterior cerebral artery appears to be improved in comparison to the pre stent angiogram with more symmetric filling of the bilateral posterior cerebral arteries. Left vertebral artery, head (during  coiling) Angiogram reveals further coil mass within the residual aneurysm which is progressively occluded. No filling defects are noted. The posterior cerebral arteries remain patent. Left vertebral artery, head (immediate post coiling) Angiogram taken from the left vertebral artery reveals patent basilar artery. The left posterior cerebral artery remains widely patent. Stent is in stable position. There is coil mass within the aneurysm, with minimal neck residual. There is decreased flow through the right posterior cerebral artery with filling defect in the inferior wall of the right P1 adjacent to the distal aneurysm neck likely representing thrombus. There is decreased perfusion of the right posterior cerebral artery territory in comparison to the prior angiograms. Left vertebral artery, head (post thrombolysis) Angiograms taken 10 minutes after initial 8 mg bolus dose and taken again 10 minutes after the second 8 mg bolus dose sequentially reveal improvement in flow through the right posterior cerebral artery. There is also much smaller filling defect along the wall of the posterior cerebral artery adjacent to the coil mass indicating dissolution of some of the thrombus. The final angiogram does show minimally decreased flow through the right posterior cerebral artery with significantly improved perfusion of the right PCA territory in comparison to the previous angiograms. Venous sinuses remain widely patent. DISPOSITION: Upon completion of the study, the femoral sheath was secured in place with nylon suture and connected to a pressure system. The patient was extubated and transferred to the postanesthesia care unit in stable hemodynamic condition. IMPRESSION: 1. 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 Procedure was complicated by stent thrombosis treated with intra arterial and intravenous Integrilin. The preliminary results of this  procedure were shared with the patient's family. 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 Kathyrn Sheriff.   Vallarie Mare 11/21/2020, 11:13 AM

## 2020-11-22 ENCOUNTER — Encounter (HOSPITAL_COMMUNITY): Payer: Self-pay | Admitting: Neurosurgery

## 2020-11-22 MED ORDER — TICAGRELOR 90 MG PO TABS: 90.0000 mg | ORAL_TABLET | Freq: Two times a day (BID) | ORAL | 3 refills | Status: DC

## 2020-11-22 MED ORDER — ASPIRIN 81 MG PO CHEW: 81.0000 mg | CHEWABLE_TABLET | Freq: Every day | ORAL | 3 refills | Status: DC

## 2020-11-22 MED ORDER — HYDROCODONE-ACETAMINOPHEN 5-325 MG PO TABS: 1.0000 | ORAL_TABLET | Freq: Four times a day (QID) | ORAL | 0 refills | Status: AC | PRN

## 2020-11-22 NOTE — Progress Notes (Signed)
  NEUROSURGERY PROGRESS NOTE   No issues overnight. No HA currently. No visual changes. Tolerating diet, ambulating independently.  EXAM:  BP 133/68   Pulse 64   Temp 97.6 F (36.4 C) (Oral)   Resp 18   Ht 5\' 7"  (1.702 m)   Wt 79.4 kg   LMP 01/31/2007   SpO2 96%   BMI 27.42 kg/m   Awake, alert, oriented  Speech fluent, appropriate  CN grossly intact  5/5 BUE/BLE  Right groin site soft, no hematoma  IMPRESSION:  60 y.o. female s/p stent-supported coiling of symptomatic basilar aneurysm, doing well  PLAN: - d/c home today on daily ASA/Brilinta   Consuella Lose, MD [facility]

## 2020-11-22 NOTE — Discharge Summary (Signed)
Physician Discharge Summary  Patient ID: Delorese Horn MRN: [id number] DOB/AGE: 1960/07/24 60 y.o.  Admit date: 11/16/2020 Discharge date: 11/22/2020  Admission Diagnoses:  Cerebral aneurysm, non-ruptured  Discharge Diagnoses:  Same Active Problems:   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HCC)   Aneurysm ([place])   Aneurysm, cerebral, nonruptured   Discharged Condition: Stable  Hospital Course:  Tiffany Horn is a 60 y.o. female admitted with HA and diplopia and found to have a large basilar aneurysm but no SAH. She was loaded with ASA and plavix and taken for stent-supported coil embolization. She did have partial intraoperative thrombosis within her stent and was started on Integrilin drop postop for 18hrs with the sheath left in place. She was at neurologic baseline. Sheath was removed with manual compression the next day without hematoma. She remained at baseline and was observed overnight and was then ambulating independently, tolerating diet with minimal HA. She requested discharge.  Treatments: Surgery - Stent-supported coil embolization of basilar aneurysm  Discharge Exam: Blood pressure 133/68, pulse 64, temperature 97.6 F (36.4 C), temperature source Oral, resp. rate 18, height 5\' 7"  (1.702 m), weight 79.4 kg, last menstrual period 01/31/2007, SpO2 96 %. Awake, alert, oriented Speech fluent, appropriate CN grossly intact 5/5 BUE/BLE Wound c/d/i  Disposition: Discharge disposition: 01-Home or Self Care       Discharge Instructions     Call MD for:  redness, tenderness, or signs of infection (pain, swelling, redness, odor or green/yellow discharge around incision site)   Complete by: As directed    Call MD for:  temperature >100.4   Complete by: As directed    Diet - low sodium heart healthy   Complete by: As directed    Discharge instructions   Complete by: As directed    Walk at home as much as possible, at least 4 times / day   Increase activity slowly    Complete by: As directed    Lifting restrictions   Complete by: As directed    No lifting > 10 lbs   May shower / Bathe   Complete by: As directed    48 hours after surgery   May walk up steps   Complete by: As directed    Other Restrictions   Complete by: As directed    No bending/twisting at waist   Remove dressing in 24 hours   Complete by: As directed       Allergies as of 11/22/2020       Reactions   Abilify [aripiprazole] Nausea And Vomiting   Amoxicillin Diarrhea   Seroquel [quetiapine Fumarate]    Unknown        Medication List     STOP taking these medications    atorvastatin 40 MG tablet Commonly known as: LIPITOR       TAKE these medications    albuterol 108 (90 Base) MCG/ACT inhaler Commonly known as: VENTOLIN HFA Inhale 2 puffs into the lungs every 6 (six) hours as needed for wheezing or shortness of breath.   aspirin 81 MG chewable tablet Chew 1 tablet (81 mg total) by mouth daily.   B COMPLEX PO Take by mouth.   Breztri Aerosphere 160-9-4.8 MCG/ACT Aero Generic drug: Budeson-Glycopyrrol-Formoterol Inhale 2 puffs into the lungs in the morning and at bedtime.   clonazePAM 0.5 MG tablet Commonly known as: KLONOPIN Take 1 tablet (0.5 mg total) by mouth 3 (three) times daily as needed for anxiety. What changed: when to take this   HYDROcodone-acetaminophen 5-325 MG tablet  Commonly known as: NORCO/VICODIN Take 1 tablet by mouth every 6 (six) hours as needed for up to 7 days for moderate pain.   levothyroxine 112 MCG tablet Commonly known as: SYNTHROID Take 1 tablet (112 mcg total) by mouth daily.   MAGNESIUM GLYCINATE PO Take 1 tablet by mouth at bedtime.   PARoxetine 40 MG tablet Commonly known as: PAXIL Take 1 tablet (40 mg total) by mouth daily.   rizatriptan 5 MG tablet Commonly known as: MAXALT Take 1 tablet (5 mg total) by mouth as needed for migraine. May repeat in 2 hours if needed What changed:  when to take  this additional instructions   Spacer/Aero-Holding Dorise Bullion Use daily with inhaler   ticagrelor 90 MG Tabs tablet Commonly known as: BRILINTA Take 1 tablet (90 mg total) by mouth 2 (two) times daily.   vitamin C 1000 MG tablet Take 1,000 mg by mouth daily.        Follow-up Information     Consuella Lose, MD Follow up in 2 week(s).   Specialty: Neurosurgery Contact information: [address] [place] [address] [phone]                 Signed: Jairo Ben 11/22/2020, 10:06 AM

## 2020-11-25 ENCOUNTER — Telehealth (INDEPENDENT_AMBULATORY_CARE_PROVIDER_SITE_OTHER): Payer: BC Managed Care – PPO | Admitting: Family Medicine

## 2020-11-25 ENCOUNTER — Ambulatory Visit: Payer: BC Managed Care – PPO

## 2020-11-25 ENCOUNTER — Encounter: Payer: Self-pay | Admitting: Family Medicine

## 2020-11-25 ENCOUNTER — Other Ambulatory Visit: Payer: Self-pay

## 2020-11-25 ENCOUNTER — Telehealth: Payer: Self-pay

## 2020-11-25 ENCOUNTER — Ambulatory Visit (HOSPITAL_BASED_OUTPATIENT_CLINIC_OR_DEPARTMENT_OTHER)
Admission: RE | Admit: 2020-11-25 | Discharge: 2020-11-25 | Disposition: A | Discharge status: Home / Self Care | Payer: BC Managed Care – PPO | Source: Ambulatory Visit | Attending: Family Medicine | Admitting: Family Medicine

## 2020-11-25 VITALS — Temp 97.1°F

## 2020-11-25 DIAGNOSIS — Z9889 Other specified postprocedural states: Secondary | ICD-10-CM

## 2020-11-25 DIAGNOSIS — G43909 Migraine, unspecified, not intractable, without status migrainosus: Secondary | ICD-10-CM | POA: Diagnosis not present

## 2020-11-25 DIAGNOSIS — R051 Acute cough: Secondary | ICD-10-CM

## 2020-11-25 DIAGNOSIS — E039 Hypothyroidism, unspecified: Secondary | ICD-10-CM | POA: Diagnosis not present

## 2020-11-25 DIAGNOSIS — J4521 Mild intermittent asthma with (acute) exacerbation: Secondary | ICD-10-CM

## 2020-11-25 LAB — POCT RAPID STREP A (OFFICE): Rapid Strep A Screen: NEGATIVE

## 2020-11-25 LAB — POCT INFLUENZA A/B
Influenza A, POC: NEGATIVE
Influenza B, POC: NEGATIVE

## 2020-11-25 MED ORDER — LEVOTHYROXINE SODIUM 112 MCG PO TABS: 112.0000 ug | ORAL_TABLET | Freq: Every day | ORAL | 0 refills | Status: DC

## 2020-11-25 MED ORDER — LEVOFLOXACIN 500 MG PO TABS: 500.0000 mg | ORAL_TABLET | Freq: Every day | ORAL | 0 refills | Status: AC

## 2020-11-25 MED ORDER — RIZATRIPTAN BENZOATE 5 MG PO TABS: 5.0000 mg | ORAL_TABLET | Freq: Every day | ORAL | 5 refills | Status: DC | PRN

## 2020-11-25 NOTE — Telephone Encounter (Signed)
A user error has taken place: encounter opened in error, closed for administrative reasons.

## 2020-11-25 NOTE — Progress Notes (Signed)
VIRTUAL VISIT VIA VIDEO  I connected with Tiffany Horn on 11/25/20 at 10:00 AM EDT by elemedicine application and verified that I am speaking with the correct person using two identifiers. Location patient: Home Location provider: [facility], Office Persons participating in the virtual visit: Patient, Dr. Raoul Pitch and Darnell Level. Cesar, CMA  I discussed the limitations of evaluation and management by telemedicine and the availability of in person appointments. The patient expressed understanding and agreed to proceed.   SUBJECTIVE Chief Complaint  Patient presents with   Cough    Pt c/o dry cough, chest congestion, fever 100.9, fatigue x 2 days; pt recently had surgery Friday    HPI: Tiffany Horn is a 60 y.o. female present for acute illness.  Exposure:pt reports she recently was in the hospital for headache and found to have a basilar brain aneurysm. She underwent surgical coiling under general anes. Per pt. 2 days ago she noticed increase fatigue, dry cough, chest congestion and fever 100.9 F. She states she has not had a fever in over 24 hours now. She denies shortness of breath or wheezing. She is tolerating Po. Her tsh was high in the hospital . She has not taken her levo 112 mcg in couple days.  Sx start: 2 days ago.  Vaccine:completed covid x3 GFR: normal Anticoag: yes   ROS: See pertinent positives and negatives per HPI.  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) 09/12/2017   Headache, migraine 02/15/2015   Vitamin D deficiency 10/19/2014   Encounter for preventive health examination 10/19/2014   Depression with anxiety 09/02/2014   Dyspnea on exertion 08/26/2014    Hyperlipidemia 08/18/2014   Hypothyroidism 08/18/2014   Obesity (BMI 30-39.9) 08/18/2014    Social History   Tobacco Use   Smoking status: Never   Smokeless tobacco: Never  Substance Use Topics   Alcohol use: Yes    Alcohol/week: 2.0 standard drinks    Types: 2 Glasses of wine per week    Comment: OCC / SOCIAL  MAYBE 2 A WEEK    Current Outpatient Medications:    albuterol (VENTOLIN HFA) 108 (90 Base) MCG/ACT inhaler, Inhale 2 puffs into the lungs every 6 (six) hours as needed for wheezing or shortness of breath., Disp: 6.7 g, Rfl: 5   Ascorbic Acid (VITAMIN C) 1000 MG tablet, Take 1,000 mg by mouth daily., Disp: , Rfl:    aspirin 81 MG chewable tablet, Chew 1 tablet (81 mg total) by mouth daily., Disp: 30 tablet, Rfl: 3   B Complex Vitamins (B COMPLEX PO), Take by mouth., Disp: , Rfl:    Budeson-Glycopyrrol-Formoterol (BREZTRI AEROSPHERE) 160-9-4.8 MCG/ACT AERO, Inhale 2 puffs into the lungs in the morning and at bedtime., Disp: 5.9 g, Rfl: 1   clonazePAM (KLONOPIN) 0.5 MG tablet, Take 1 tablet (0.5 mg total) by mouth 3 (three) times daily as needed for anxiety. (Patient taking differently: Take 0.5 mg by mouth daily as needed for anxiety.), Disp: 90 tablet, Rfl: 5   HYDROcodone-acetaminophen (NORCO/VICODIN) 5-325 MG tablet, Take 1 tablet by mouth every 6 (six) hours as needed for up to 7 days for moderate pain., Disp: 28 tablet, Rfl: 0   levofloxacin (LEVAQUIN) 500 MG tablet, Take 1  tablet (500 mg total) by mouth daily for 7 days., Disp: 7 tablet, Rfl: 0   MAGNESIUM GLYCINATE PO, Take 1 tablet by mouth at bedtime., Disp: , Rfl:    PARoxetine (PAXIL) 40 MG tablet, Take 1 tablet (40 mg total) by mouth daily., Disp: 90 tablet, Rfl: 1   Spacer/Aero-Holding Chambers DEVI, Use daily with inhaler, Disp: 1 each, Rfl: 0   ticagrelor (BRILINTA) 90 MG TABS tablet, Take 1 tablet (90 mg total) by mouth 2 (two) times daily., Disp: 60 tablet, Rfl: 3   levothyroxine (SYNTHROID) 112 MCG tablet, Take 1  tablet (112 mcg total) by mouth daily., Disp: 30 tablet, Rfl: 0   rizatriptan (MAXALT) 5 MG tablet, Take 1 tablet (5 mg total) by mouth daily as needed for migraine., Disp: 10 tablet, Rfl: 5  Current Facility-Administered Medications:    0.9 %  sodium chloride infusion, 500 mL, Intravenous, Once, Nandigam, Kavitha V, MD  Allergies  Allergen Reactions   Abilify [Aripiprazole] Nausea And Vomiting   Amoxicillin Diarrhea   Seroquel [Quetiapine Fumarate]     Unknown    OBJECTIVE: Temp (!) 97.1 F (36.2 C) (Oral)   LMP 01/31/2007  Gen: No acute distress. Nontoxic in appearance.  HENT: AT. [place].  MMM.  Eyes:Pupils Equal Round Reactive to light, Extraocular movements intact,  Conjunctiva without redness, discharge or icterus. Chest: Cough not present Skin: no rashes, purpura or petechiae.  Neuro: Alert. Oriented x3  Psych: Normal affect and demeanor. Normal speech. Normal thought content and judgment.  ASSESSMENT AND PLAN: Tiffany Horn is a 60 y.o. female present for  Recent major surgery/acute cough Rule out pneumonia secondary to recent hospitalization/anesthesia. - DG Chest 2 View; Future> pending - POCT Influenza A/B; Future> negative - POCT rapid strep A; Future> negative - Novel Coronavirus, NAA ([facility]); Future> Pending  Migraine without status migrainosus, not intractable, unspecified migraine type - rizatriptan (MAXALT) 5 MG tablet; Take 1 tablet (5 mg total) by mouth daily as needed for migraine.  Dispense: 10 tablet; Refill: 5  Hypothyroidism, unspecified type Urged her to schedule an appointment with this provider mid November to recheck her thyroid.  TSH was elevated during her hospitalization. - levothyroxine (SYNTHROID) 112 MCG tablet; Take 1 tablet (112 mcg total) by mouth daily.  Dispense: 30 tablet; Refill: 0  Mild intermittent asthmatic bronchitis with acute exacerbation Rest, hydrate.  mucinex (DM if cough), nettie pot or nasal saline.  LQ x7d prescribed,  take until completed.  If cough present it can last up to 6-8 weeks.  F/U 2 weeks if not improved.  Influenza and strep tests are negative. Elect to treat as at least bronchitis, possible rule out pneumonia. Reviewed home care instructions for COVID. Advised self-isolation at home for at least 5 days. After 5 days, if improved and fever resolved, can be in public, but should wear a mask around others for an additional 5 days. If symptoms, esp, dyspnea develops/worsens, recommend in-person evaluation at either an urgent care or the emergency room.  Howard Pouch, DO 11/25/2020   No follow-ups on file.  Orders Placed This Encounter  Procedures   Novel Coronavirus, NAA ([facility])   DG Chest 2 View   POCT Influenza A/B   POCT rapid strep A   Meds ordered this encounter  Medications   rizatriptan (MAXALT) 5 MG tablet    Sig: Take 1 tablet (5 mg total) by mouth daily as needed for migraine.    Dispense:  10 tablet    Refill:  5  levothyroxine (SYNTHROID) 112 MCG tablet    Sig: Take 1 tablet (112 mcg total) by mouth daily.    Dispense:  30 tablet    Refill:  0   levofloxacin (LEVAQUIN) 500 MG tablet    Sig: Take 1 tablet (500 mg total) by mouth daily for 7 days.    Dispense:  7 tablet    Refill:  0   Referral Orders  No referral(s) requested today

## 2020-11-26 ENCOUNTER — Telehealth: Payer: Self-pay | Admitting: Family Medicine

## 2020-11-26 ENCOUNTER — Telehealth: Payer: Self-pay

## 2020-11-26 LAB — NOVEL CORONAVIRUS, NAA: SARS-CoV-2, NAA: NOT DETECTED

## 2020-11-26 LAB — SARS-COV-2, NAA 2 DAY TAT

## 2020-11-26 NOTE — Telephone Encounter (Signed)
Pt called to check on covid and xray results. Advised pt results not received yet.

## 2020-11-26 NOTE — Telephone Encounter (Signed)
Pt aware results are still pending and will receive a call once received

## 2020-11-26 NOTE — Telephone Encounter (Signed)
Transition Care Management Unsuccessful Follow-up Telephone Call  Date of discharge and from where:  11/22/2020  [name]  Attempts:  1st Attempt  Reason for unsuccessful TCM follow-up call:  No answer/busy  [name], RN, BSN, CEN [place] Coordinator [phone]

## 2020-11-29 ENCOUNTER — Telehealth: Payer: Self-pay

## 2020-11-29 NOTE — Telephone Encounter (Signed)
Transition Care Management Unsuccessful Follow-up Telephone Call  Date of discharge and from where:  11/22/20 from [facility]  Attempts:  2nd Attempt  Reason for unsuccessful TCM follow-up call:  Left voice message   Thea Silversmith, RN, MSN, BSN, [place] Care Management Coordinator [phone]

## 2020-12-02 ENCOUNTER — Inpatient Hospital Stay (HOSPITAL_COMMUNITY)
Admission: RE | Admit: 2020-12-02 | Discharge: 2020-12-02 | Disposition: A | Discharge status: Home / Self Care | Payer: BC Managed Care – PPO | Source: Ambulatory Visit | Attending: Neurosurgery | Admitting: Neurosurgery

## 2020-12-02 ENCOUNTER — Other Ambulatory Visit (HOSPITAL_COMMUNITY): Payer: Self-pay | Admitting: Neurosurgery

## 2020-12-02 ENCOUNTER — Other Ambulatory Visit: Payer: Self-pay | Admitting: Neurosurgery

## 2020-12-02 DIAGNOSIS — I639 Cerebral infarction, unspecified: Secondary | ICD-10-CM | POA: Diagnosis not present

## 2020-12-02 DIAGNOSIS — I671 Cerebral aneurysm, nonruptured: Secondary | ICD-10-CM

## 2020-12-02 DIAGNOSIS — G9782 Other postprocedural complications and disorders of nervous system: Secondary | ICD-10-CM | POA: Diagnosis not present

## 2020-12-04 ENCOUNTER — Other Ambulatory Visit: Payer: Self-pay

## 2020-12-04 ENCOUNTER — Encounter (HOSPITAL_COMMUNITY): Payer: Self-pay

## 2020-12-04 ENCOUNTER — Emergency Department (HOSPITAL_COMMUNITY): Payer: BC Managed Care – PPO

## 2020-12-04 ENCOUNTER — Inpatient Hospital Stay (HOSPITAL_COMMUNITY)
Admission: EM | Admit: 2020-12-04 | Discharge: 2020-12-06 | DRG: 091 | Disposition: A | Discharge status: Home / Self Care | Payer: BC Managed Care – PPO | Attending: Student | Admitting: Student

## 2020-12-04 DIAGNOSIS — Z803 Family history of malignant neoplasm of breast: Secondary | ICD-10-CM | POA: Diagnosis not present

## 2020-12-04 DIAGNOSIS — Z7902 Long term (current) use of antithrombotics/antiplatelets: Secondary | ICD-10-CM | POA: Diagnosis not present

## 2020-12-04 DIAGNOSIS — Z6830 Body mass index (BMI) 30.0-30.9, adult: Secondary | ICD-10-CM | POA: Diagnosis not present

## 2020-12-04 DIAGNOSIS — F32A Depression, unspecified: Secondary | ICD-10-CM | POA: Diagnosis present

## 2020-12-04 DIAGNOSIS — G9782 Other postprocedural complications and disorders of nervous system: Principal | ICD-10-CM | POA: Diagnosis present

## 2020-12-04 DIAGNOSIS — J4520 Mild intermittent asthma, uncomplicated: Secondary | ICD-10-CM | POA: Diagnosis present

## 2020-12-04 DIAGNOSIS — I10 Essential (primary) hypertension: Secondary | ICD-10-CM | POA: Diagnosis present

## 2020-12-04 DIAGNOSIS — Z79899 Other long term (current) drug therapy: Secondary | ICD-10-CM

## 2020-12-04 DIAGNOSIS — H4902 Third [oculomotor] nerve palsy, left eye: Secondary | ICD-10-CM | POA: Diagnosis present

## 2020-12-04 DIAGNOSIS — I6340 Cerebral infarction due to embolism of unspecified cerebral artery: Secondary | ICD-10-CM | POA: Diagnosis present

## 2020-12-04 DIAGNOSIS — Z7982 Long term (current) use of aspirin: Secondary | ICD-10-CM

## 2020-12-04 DIAGNOSIS — E039 Hypothyroidism, unspecified: Secondary | ICD-10-CM | POA: Diagnosis present

## 2020-12-04 DIAGNOSIS — Z8673 Personal history of transient ischemic attack (TIA), and cerebral infarction without residual deficits: Secondary | ICD-10-CM

## 2020-12-04 DIAGNOSIS — R42 Dizziness and giddiness: Secondary | ICD-10-CM | POA: Diagnosis not present

## 2020-12-04 DIAGNOSIS — I639 Cerebral infarction, unspecified: Secondary | ICD-10-CM

## 2020-12-04 DIAGNOSIS — Z800 Family history of malignant neoplasm of digestive organs: Secondary | ICD-10-CM | POA: Diagnosis not present

## 2020-12-04 DIAGNOSIS — I6389 Other cerebral infarction: Secondary | ICD-10-CM | POA: Diagnosis not present

## 2020-12-04 DIAGNOSIS — G43909 Migraine, unspecified, not intractable, without status migrainosus: Secondary | ICD-10-CM | POA: Diagnosis present

## 2020-12-04 DIAGNOSIS — Z833 Family history of diabetes mellitus: Secondary | ICD-10-CM | POA: Diagnosis not present

## 2020-12-04 DIAGNOSIS — Z20822 Contact with and (suspected) exposure to covid-19: Secondary | ICD-10-CM | POA: Diagnosis present

## 2020-12-04 DIAGNOSIS — H538 Other visual disturbances: Secondary | ICD-10-CM | POA: Diagnosis not present

## 2020-12-04 DIAGNOSIS — F419 Anxiety disorder, unspecified: Secondary | ICD-10-CM | POA: Diagnosis present

## 2020-12-04 DIAGNOSIS — E785 Hyperlipidemia, unspecified: Secondary | ICD-10-CM | POA: Diagnosis present

## 2020-12-04 DIAGNOSIS — Z79890 Hormone replacement therapy (postmenopausal): Secondary | ICD-10-CM

## 2020-12-04 DIAGNOSIS — Z7951 Long term (current) use of inhaled steroids: Secondary | ICD-10-CM

## 2020-12-04 DIAGNOSIS — Z823 Family history of stroke: Secondary | ICD-10-CM

## 2020-12-04 DIAGNOSIS — R29705 NIHSS score 5: Secondary | ICD-10-CM | POA: Diagnosis present

## 2020-12-04 DIAGNOSIS — G8194 Hemiplegia, unspecified affecting left nondominant side: Secondary | ICD-10-CM | POA: Diagnosis present

## 2020-12-04 DIAGNOSIS — E669 Obesity, unspecified: Secondary | ICD-10-CM | POA: Diagnosis present

## 2020-12-04 DIAGNOSIS — I97821 Postprocedural cerebrovascular infarction during other surgery: Secondary | ICD-10-CM | POA: Diagnosis present

## 2020-12-04 DIAGNOSIS — Z888 Allergy status to other drugs, medicaments and biological substances status: Secondary | ICD-10-CM

## 2020-12-04 DIAGNOSIS — Z8249 Family history of ischemic heart disease and other diseases of the circulatory system: Secondary | ICD-10-CM

## 2020-12-04 DIAGNOSIS — Z880 Allergy status to penicillin: Secondary | ICD-10-CM

## 2020-12-04 DIAGNOSIS — I725 Aneurysm of other precerebral arteries: Secondary | ICD-10-CM | POA: Diagnosis not present

## 2020-12-04 LAB — DIFFERENTIAL
Abs Immature Granulocytes: 0.01 10*3/uL (ref 0.00–0.07)
Basophils Absolute: 0.1 10*3/uL (ref 0.0–0.1)
Basophils Relative: 2 %
Eosinophils Absolute: 0.2 10*3/uL (ref 0.0–0.5)
Eosinophils Relative: 4 %
Immature Granulocytes: 0 %
Lymphocytes Relative: 40 %
Lymphs Abs: 2.1 10*3/uL (ref 0.7–4.0)
Monocytes Absolute: 0.4 10*3/uL (ref 0.1–1.0)
Monocytes Relative: 7 %
Neutro Abs: 2.5 10*3/uL (ref 1.7–7.7)
Neutrophils Relative %: 47 %

## 2020-12-04 LAB — COMPREHENSIVE METABOLIC PANEL
ALT: 21 U/L (ref 0–44)
AST: 19 U/L (ref 15–41)
Albumin: 3.6 g/dL (ref 3.5–5.0)
Alkaline Phosphatase: 52 U/L (ref 38–126)
Anion gap: 9 (ref 5–15)
BUN: 9 mg/dL (ref 6–20)
CO2: 23 mmol/L (ref 22–32)
Calcium: 8.6 mg/dL — ABNORMAL LOW (ref 8.9–10.3)
Chloride: 107 mmol/L (ref 98–111)
Creatinine, Ser: 0.64 mg/dL (ref 0.44–1.00)
GFR, Estimated: >60 mL/min (ref >60)
Glucose, Bld: 120 mg/dL — ABNORMAL HIGH (ref 70–99)
Potassium: 3.4 mmol/L — ABNORMAL LOW (ref 3.5–5.1)
Sodium: 139 mmol/L (ref 135–145)
Total Bilirubin: 0.5 mg/dL (ref 0.3–1.2)
Total Protein: 6.3 g/dL — ABNORMAL LOW (ref 6.5–8.1)

## 2020-12-04 LAB — CBC
HCT: 39.9 % (ref 36.0–46.0)
Hemoglobin: 13.2 g/dL (ref 12.0–15.0)
MCH: 30.2 pg (ref 26.0–34.0)
MCHC: 33.1 g/dL (ref 30.0–36.0)
MCV: 91.3 fL (ref 80.0–100.0)
Platelets: 233 10*3/uL (ref 150–400)
RBC: 4.37 MIL/uL (ref 3.87–5.11)
RDW: 12.5 % (ref 11.5–15.5)
WBC: 5.3 10*3/uL (ref 4.0–10.5)
nRBC: 0 % (ref 0.0–0.2)

## 2020-12-04 LAB — RESP PANEL BY RT-PCR (FLU A&B, COVID) ARPGX2
Influenza A by PCR: NEGATIVE
Influenza B by PCR: NEGATIVE
SARS Coronavirus 2 by RT PCR: NEGATIVE

## 2020-12-04 LAB — I-STAT CHEM 8, ED
BUN: 10 mg/dL (ref 6–20)
Calcium, Ion: 1.08 mmol/L — ABNORMAL LOW (ref 1.15–1.40)
Chloride: 105 mmol/L (ref 98–111)
Creatinine, Ser: 0.6 mg/dL (ref 0.44–1.00)
Glucose, Bld: 123 mg/dL — ABNORMAL HIGH (ref 70–99)
HCT: 39 % (ref 36.0–46.0)
Hemoglobin: 13.3 g/dL (ref 12.0–15.0)
Potassium: 3.4 mmol/L — ABNORMAL LOW (ref 3.5–5.1)
Sodium: 141 mmol/L (ref 135–145)
TCO2: 24 mmol/L (ref 22–32)

## 2020-12-04 LAB — I-STAT BETA HCG BLOOD, ED (MC, WL, AP ONLY): I-stat hCG, quantitative: <5 m[IU]/mL (ref <5)

## 2020-12-04 LAB — APTT: aPTT: 27 seconds (ref 24–36)

## 2020-12-04 LAB — CBG MONITORING, ED: Glucose-Capillary: 115 mg/dL — ABNORMAL HIGH (ref 70–99)

## 2020-12-04 LAB — PROTIME-INR
INR: 1 (ref 0.8–1.2)
Prothrombin Time: 12.8 seconds (ref 11.4–15.2)

## 2020-12-04 MED ORDER — PAROXETINE HCL 20 MG PO TABS
40.0000 mg | ORAL_TABLET | Freq: Every day | ORAL | Status: DC
  Administered 2020-12-04 – 2020-12-06 (×3): 40 mg via ORAL
  Filled 2020-12-04 (×4): qty 2

## 2020-12-04 MED ORDER — BUDESON-GLYCOPYRROL-FORMOTEROL 160-9-4.8 MCG/ACT IN AERO
2.0000 | INHALATION_SPRAY | Freq: Two times a day (BID) | RESPIRATORY_TRACT | Status: DC
Start: 2020-12-04 — End: 2020-12-04

## 2020-12-04 MED ORDER — ALBUTEROL SULFATE (2.5 MG/3ML) 0.083% IN NEBU: 3.0000 mL | INHALATION_SOLUTION | Freq: Four times a day (QID) | RESPIRATORY_TRACT | Status: DC | PRN

## 2020-12-04 MED ORDER — STROKE: EARLY STAGES OF RECOVERY BOOK
Freq: Once | Status: AC
  Filled 2020-12-04 (×2): qty 1

## 2020-12-04 MED ORDER — HYDRALAZINE HCL 25 MG PO TABS: 25.0000 mg | ORAL_TABLET | Freq: Four times a day (QID) | ORAL | Status: DC | PRN

## 2020-12-04 MED ORDER — ACETAMINOPHEN 650 MG RE SUPP: 650.0000 mg | RECTAL | Status: DC | PRN

## 2020-12-04 MED ORDER — LEVOTHYROXINE SODIUM 112 MCG PO TABS
112.0000 ug | ORAL_TABLET | Freq: Every day | ORAL | Status: DC
  Administered 2020-12-05 – 2020-12-06 (×2): 112 ug via ORAL
  Filled 2020-12-04 (×3): qty 1

## 2020-12-04 MED ORDER — ACETAMINOPHEN 500 MG PO TABS
1000.0000 mg | ORAL_TABLET | Freq: Once | ORAL | Status: AC
  Administered 2020-12-04: 1000 mg via ORAL
  Filled 2020-12-04: qty 2

## 2020-12-04 MED ORDER — ENOXAPARIN SODIUM 40 MG/0.4ML IJ SOSY
40.0000 mg | PREFILLED_SYRINGE | INTRAMUSCULAR | Status: DC
  Administered 2020-12-04 – 2020-12-05 (×2): 40 mg via SUBCUTANEOUS
  Filled 2020-12-04 (×2): qty 0.4

## 2020-12-04 MED ORDER — ASPIRIN 81 MG PO CHEW
81.0000 mg | CHEWABLE_TABLET | Freq: Every day | ORAL | Status: DC
  Administered 2020-12-04 – 2020-12-06 (×3): 81 mg via ORAL
  Filled 2020-12-04 (×3): qty 1

## 2020-12-04 MED ORDER — UMECLIDINIUM BROMIDE 62.5 MCG/ACT IN AEPB
1.0000 | INHALATION_SPRAY | Freq: Every day | RESPIRATORY_TRACT | Status: DC
  Administered 2020-12-05 – 2020-12-06 (×2): 1 via RESPIRATORY_TRACT
  Filled 2020-12-04: qty 7

## 2020-12-04 MED ORDER — SODIUM CHLORIDE 0.9% FLUSH: 3.0000 mL | Freq: Once | INTRAVENOUS | Status: DC

## 2020-12-04 MED ORDER — FLUTICASONE FUROATE-VILANTEROL 200-25 MCG/ACT IN AEPB
1.0000 | INHALATION_SPRAY | Freq: Every day | RESPIRATORY_TRACT | Status: DC
  Administered 2020-12-05 – 2020-12-06 (×2): 1 via RESPIRATORY_TRACT
  Filled 2020-12-04: qty 28

## 2020-12-04 MED ORDER — ACETAMINOPHEN 160 MG/5ML PO SOLN: 650.0000 mg | ORAL | Status: DC | PRN

## 2020-12-04 MED ORDER — NIMODIPINE 30 MG PO CAPS
60.0000 mg | ORAL_CAPSULE | Freq: Four times a day (QID) | ORAL | Status: DC
  Administered 2020-12-04 – 2020-12-06 (×6): 60 mg via ORAL
  Filled 2020-12-04 (×12): qty 2

## 2020-12-04 MED ORDER — IOHEXOL 350 MG/ML SOLN
80.0000 mL | Freq: Once | INTRAVENOUS | Status: AC | PRN
  Administered 2020-12-04: 80 mL via INTRAVENOUS

## 2020-12-04 MED ORDER — ACETAMINOPHEN 325 MG PO TABS
650.0000 mg | ORAL_TABLET | ORAL | Status: DC | PRN
  Administered 2020-12-04 – 2020-12-05 (×2): 650 mg via ORAL
  Filled 2020-12-04 (×2): qty 2

## 2020-12-04 MED ORDER — TICAGRELOR 90 MG PO TABS
90.0000 mg | ORAL_TABLET | Freq: Two times a day (BID) | ORAL | Status: DC
  Administered 2020-12-04 – 2020-12-06 (×4): 90 mg via ORAL
  Filled 2020-12-04 (×4): qty 1

## 2020-12-04 MED ORDER — CLONAZEPAM 0.5 MG PO TABS
0.5000 mg | ORAL_TABLET | Freq: Every day | ORAL | Status: DC | PRN
  Administered 2020-12-05: 0.5 mg via ORAL
  Filled 2020-12-04: qty 1

## 2020-12-04 MED ORDER — SUMATRIPTAN SUCCINATE 50 MG PO TABS: 50.0000 mg | ORAL_TABLET | ORAL | Status: DC | PRN

## 2020-12-04 MED ORDER — SENNOSIDES-DOCUSATE SODIUM 8.6-50 MG PO TABS: 1.0000 | ORAL_TABLET | Freq: Every evening | ORAL | Status: DC | PRN

## 2020-12-04 MED ORDER — POTASSIUM CHLORIDE CRYS ER 20 MEQ PO TBCR
40.0000 meq | EXTENDED_RELEASE_TABLET | Freq: Once | ORAL | Status: AC
  Administered 2020-12-04: 40 meq via ORAL
  Filled 2020-12-04: qty 2

## 2020-12-04 NOTE — ED Provider Notes (Signed)
[facility] EMERGENCY DEPARTMENT Provider Note   CSN: [id number] Arrival date & time: 12/04/20  1422     History Chief Complaint  Patient presents with   Numbness    Tiffany Horn is a 60 y.o. female.  Patient is a 60 year old female who presents with left side tingling and numbness in her hand and leg as well as some vision changes in the periphery of her left eye.  She was recently admitted from October 18 to October 24 after she presented with a headache and double vision.  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  There was noted to be some partial intraoperative thrombus at the stent site and she was placed on Integrilin for 18 hours.  She was discharged on aspirin and Brilinta.  She states she has been having a headache for the last couple of days.  Today she noted some difficulty with her peripheral vision and some tingling in her left arm and left leg.  She does not report any weakness in her extremities.  No speech deficits.  Code stroke was activated in triage.      Past Medical History:  Diagnosis Date   Allergy    Anxiety    Arthritis    knees and spine, shoulder   Asthma    Bronchitis    hx - recurrent   Complication of anesthesia    waking up is not easy   Depression    Elevated IgE level 09/12/2017   09/12/2017 IgE 195   H/O miscarriage, not currently pregnant    Hx of irritable bowel syndrome    x2   Hyperlipidemia    diet controlled - no medication   Hypertension    not taking any meds at present - under control per patient   Hypokalemia    with PNA admission (2.5)   Migraines    Pneumonia    4 episodes; hosp. admission 2014   PONV (postoperative nausea and vomiting)    Thyroid disease    UC (ulcerative colitis) [facility])    DX'D 2021    Patient Active Problem List   Diagnosis Date Noted   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[place]) 11/17/2020   Family history of heart disease  09/03/2019   Mild intermittent asthma 07/25/2019   Osteoporosis 06/12/2019   Elevated LFTs 06/06/2019   Hematuria 06/06/2019   Elevated IgE level 09/12/2017   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 [facility]-2017 LAST   DILATION AND CURETTAGE OF UTERUS     IR ANGIO INTRA EXTRACRAN SEL INTERNAL CAROTID BILAT MOD SED  11/19/2020   IR ANGIO VERTEBRAL SEL VERTEBRAL BILAT MOD SED  11/19/2020   IR ANGIOGRAM FOLLOW UP STUDY  11/19/2020   IR ANGIOGRAM FOLLOW UP STUDY  11/19/2020   IR ANGIOGRAM FOLLOW UP STUDY  11/19/2020   IR ANGIOGRAM FOLLOW UP STUDY  11/19/2020   IR ANGIOGRAM FOLLOW UP STUDY  11/19/2020   IR ANGIOGRAM FOLLOW UP STUDY  11/19/2020  IR ANGIOGRAM FOLLOW UP STUDY  11/19/2020   IR ANGIOGRAM FOLLOW UP STUDY  11/19/2020   IR INTRA CRAN STENT  11/19/2020   IR TRANSCATH/EMBOLIZ  11/19/2020   LAPAROSCOPIC ABDOMINAL EXPLORATION  01/31/1992   endometriosis   ORIF HUMERUS FRACTURE Left 04/01/2013   DR Ninfa Linden - shoulder   ORIF HUMERUS FRACTURE Left 04/01/2013   Procedure: OPEN REDUCTION INTERNAL FIXATION (ORIF) LEFT PROXIMAL HUMERUS FRACTURE;  Surgeon: Mcarthur Rossetti, MD;  Location: [facility];  Service: Orthopedics;  Laterality: Left;   RADIOLOGY WITH ANESTHESIA N/A 11/19/2020   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;  Surgeon: Consuella Lose, MD;  Location: [facility];  Service: Radiology;  Laterality: N/A;   TONSILLECTOMY AND ADENOIDECTOMY       OB History     Gravida  2   Para  0   Term  0    Preterm  0   AB  2   Living  0      SAB  2   IAB      Ectopic      Multiple      Live Births              Family History  Problem Relation Age of Onset   Breast cancer Mother    Diabetes Mother    Heart disease Mother    Diabetes Father    Dementia Father    Colon cancer Father    Stroke Paternal Grandmother    Diabetes Paternal Grandmother    Colon polyps Neg Hx    Esophageal cancer Neg Hx    Stomach cancer Neg Hx    Rectal cancer Neg Hx     Social History   Tobacco Use   Smoking status: Never   Smokeless tobacco: Never  Vaping Use   Vaping Use: Never used  Substance Use Topics   Alcohol use: Yes    Alcohol/week: 2.0 standard drinks    Types: 2 Glasses of wine per week    Comment: OCC / SOCIAL  MAYBE 2 A WEEK   Drug use: No    Home Medications Prior to Admission medications   Medication Sig Start Date End Date Taking? Authorizing Provider  albuterol (VENTOLIN HFA) 108 (90 Base) MCG/ACT inhaler Inhale 2 puffs into the lungs every 6 (six) hours as needed for wheezing or shortness of breath. 05/21/19   Kuneff, Renee A, DO  Ascorbic Acid (VITAMIN C) 1000 MG tablet Take 1,000 mg by mouth daily.    [provider]  aspirin 81 MG chewable tablet Chew 1 tablet (81 mg total) by mouth daily. 11/22/20   Consuella Lose, MD  B Complex Vitamins (B COMPLEX PO) Take by mouth.    [provider]  Budeson-Glycopyrrol-Formoterol (BREZTRI AEROSPHERE) 160-9-4.8 MCG/ACT AERO Inhale 2 puffs into the lungs in the morning and at bedtime. 04/28/20   Icard, Octavio Graves, DO  clonazePAM (KLONOPIN) 0.5 MG tablet Take 1 tablet (0.5 mg total) by mouth 3 (three) times daily as needed for anxiety. Patient taking differently: Take 0.5 mg by mouth daily as needed for anxiety. 04/23/20   Addison Lank, PA-C  levothyroxine (SYNTHROID) 112 MCG tablet Take 1 tablet (112 mcg total) by mouth daily. 11/25/20   Kuneff, Renee A, DO  MAGNESIUM GLYCINATE PO Take 1 tablet by  mouth at bedtime.    [provider]  PARoxetine (PAXIL) 40 MG tablet Take 1 tablet (40 mg total) by mouth daily. 04/23/20   Addison Lank,  PA-C  rizatriptan (MAXALT) 5 MG tablet Take 1 tablet (5 mg total) by mouth daily as needed for migraine. 11/25/20   Howard Pouch A, DO  Spacer/Aero-Holding Dorise Bullion Use daily with inhaler 04/28/20   Icard, Octavio Graves, DO  ticagrelor (BRILINTA) 90 MG TABS tablet Take 1 tablet (90 mg total) by mouth 2 (two) times daily. 11/22/20   Consuella Lose, MD    Allergies    Abilify [aripiprazole], Amoxicillin, and Seroquel [quetiapine fumarate]  Review of Systems   Review of Systems  Constitutional:  Negative for chills, diaphoresis, fatigue and fever.  HENT:  Negative for congestion, rhinorrhea and sneezing.   Eyes:  Positive for visual disturbance.  Respiratory:  Negative for cough, chest tightness and shortness of breath.   Cardiovascular:  Negative for chest pain and leg swelling.  Gastrointestinal:  Negative for abdominal pain, blood in stool, diarrhea, nausea and vomiting.  Genitourinary:  Negative for difficulty urinating, flank pain, frequency and hematuria.  Musculoskeletal:  Negative for arthralgias and back pain.  Skin:  Negative for rash.  Neurological:  Positive for numbness and headaches. Negative for dizziness, speech difficulty and weakness.   Physical Exam Updated Vital Signs BP (!) 145/99 (BP Location: Right Arm)   Pulse 73   Temp 98.5 F (36.9 C) (Oral) Comment: Simultaneous filing. User may not have seen previous data. Comment (Src): Simultaneous filing. User may not have seen previous data.  Resp 14   Ht 5\' 6"  (1.676 m)   Wt 84.7 kg   LMP 01/31/2007   SpO2 98%   BMI 30.14 kg/m   Physical Exam Constitutional:      Appearance: She is well-developed.  HENT:     Head: Normocephalic and atraumatic.  Eyes:     Pupils: Pupils are equal, round, and reactive to light.  Cardiovascular:     Rate and Rhythm: Normal  rate and regular rhythm.     Heart sounds: Normal heart sounds.  Pulmonary:     Effort: Pulmonary effort is normal. No respiratory distress.     Breath sounds: Normal breath sounds. No wheezing or rales.  Chest:     Chest wall: No tenderness.  Abdominal:     General: Bowel sounds are normal.     Palpations: Abdomen is soft.     Tenderness: There is no abdominal tenderness. There is no guarding or rebound.  Musculoskeletal:        General: Normal range of motion.     Cervical back: Normal range of motion and neck supple.  Lymphadenopathy:     Cervical: No cervical adenopathy.  Skin:    General: Skin is warm and dry.     Findings: No rash.  Neurological:     Mental Status: She is alert and oriented to person, place, and time.     Comments: Motor 5 out of 5 all extremities, sensation grossly intact to light touch all extremities, cranial nerves II through XII grossly intact.    ED Results / Procedures / Treatments   Labs (all labs ordered are listed, but only abnormal results are displayed) Labs Reviewed  COMPREHENSIVE METABOLIC PANEL - Abnormal; Notable for the following components:      Result Value   Potassium 3.4 (*)    Glucose, Bld 120 (*)    Calcium 8.6 (*)    Total Protein 6.3 (*)    All other components within normal limits  I-STAT CHEM 8, ED - Abnormal; Notable for the following components:   Potassium 3.4 (*)  Glucose, Bld 123 (*)    Calcium, Ion 1.08 (*)    All other components within normal limits  CBG MONITORING, ED - Abnormal; Notable for the following components:   Glucose-Capillary 115 (*)    All other components within normal limits  RESP PANEL BY RT-PCR (FLU A&B, COVID) ARPGX2  PROTIME-INR  APTT  CBC  DIFFERENTIAL  I-STAT BETA HCG BLOOD, ED ([facility], [facility], [facility] ONLY)    EKG None  Radiology MR ANGIO HEAD WO CONTRAST  Result Date: 12/02/2020 CLINICAL DATA:  Cerebral aneurysm EXAM: MRI HEAD WITHOUT CONTRAST MRA HEAD WITHOUT CONTRAST TECHNIQUE:  Multiplanar, multi-echo pulse sequences of the brain and surrounding structures were acquired without intravenous contrast. Angiographic images of the Circle of Willis were acquired using MRA technique without intravenous contrast. COMPARISON:  No pertinent prior exam. FINDINGS: MRI HEAD FINDINGS Brain: Small foci of abnormal diffusion restriction in the right thalamus and right occipital lobe. No acute or chronic hemorrhage. Normal white matter signal, parenchymal volume and CSF spaces. The midline structures are normal. Vascular: Major flow voids are preserved. Skull and upper cervical spine: Normal calvarium and skull base. Visualized upper cervical spine and soft tissues are normal. Sinuses/Orbits:No paranasal sinus fluid levels or advanced mucosal thickening. No mastoid or middle ear effusion. Normal orbits. MRA HEAD FINDINGS POSTERIOR CIRCULATION: --Vertebral arteries: Normal --Inferior cerebellar arteries: Normal. --Basilar artery: Coil mass near the tip of the basilar artery. There is no visible residual aneurysm filling. --Superior cerebellar arteries: Normal. --Posterior cerebral arteries: The right P1 segment is poorly visualized due to susceptibility effects from the basilar coil mass. Left PCA is normal. The distal right PCA is patent. ANTERIOR CIRCULATION: --Intracranial internal carotid arteries: Normal. --Anterior cerebral arteries (ACA): Normal. --Middle cerebral arteries (MCA): Normal. ANATOMIC VARIANTS: None IMPRESSION: 1. Small foci of acute ischemia in the right thalamus and right occipital lobe, both within the right PCA territory. 2. Coil mass near the tip of the basilar artery. No residual aneurysm filling identified. Electronically Signed   By: Ulyses Jarred M.D.   On: 12/02/2020 22:57   MR BRAIN WO CONTRAST  Result Date: 12/02/2020 CLINICAL DATA:  Cerebral aneurysm EXAM: MRI HEAD WITHOUT CONTRAST MRA HEAD WITHOUT CONTRAST TECHNIQUE: Multiplanar, multi-echo pulse sequences of the brain  and surrounding structures were acquired without intravenous contrast. Angiographic images of the Circle of Willis were acquired using MRA technique without intravenous contrast. COMPARISON:  No pertinent prior exam. FINDINGS: MRI HEAD FINDINGS Brain: Small foci of abnormal diffusion restriction in the right thalamus and right occipital lobe. No acute or chronic hemorrhage. Normal white matter signal, parenchymal volume and CSF spaces. The midline structures are normal. Vascular: Major flow voids are preserved. Skull and upper cervical spine: Normal calvarium and skull base. Visualized upper cervical spine and soft tissues are normal. Sinuses/Orbits:No paranasal sinus fluid levels or advanced mucosal thickening. No mastoid or middle ear effusion. Normal orbits. MRA HEAD FINDINGS POSTERIOR CIRCULATION: --Vertebral arteries: Normal --Inferior cerebellar arteries: Normal. --Basilar artery: Coil mass near the tip of the basilar artery. There is no visible residual aneurysm filling. --Superior cerebellar arteries: Normal. --Posterior cerebral arteries: The right P1 segment is poorly visualized due to susceptibility effects from the basilar coil mass. Left PCA is normal. The distal right PCA is patent. ANTERIOR CIRCULATION: --Intracranial internal carotid arteries: Normal. --Anterior cerebral arteries (ACA): Normal. --Middle cerebral arteries (MCA): Normal. ANATOMIC VARIANTS: None IMPRESSION: 1. Small foci of acute ischemia in the right thalamus and right occipital lobe, both within the right PCA territory. 2.  Coil mass near the tip of the basilar artery. No residual aneurysm filling identified. Electronically Signed   By: Ulyses Jarred M.D.   On: 12/02/2020 22:57   CT HEAD CODE STROKE WO CONTRAST  Result Date: 12/04/2020 CLINICAL DATA:  Code stroke. Neuro deficit, acute, stroke suspected. Left-sided weakness with tingling in the arm and leg. Visual deficit with blurry vision following treatment of an aneurysm last  month. EXAM: CT HEAD WITHOUT CONTRAST TECHNIQUE: Contiguous axial images were obtained from the base of the skull through the vertex without intravenous contrast. COMPARISON:  Head MRI 12/02/2020 FINDINGS: Brain: There is no evidence of an acute large territory infarct, intracranial hemorrhage, mass, midline shift, or extra-axial fluid collection. There is slight hypoattenuation in the right occipital lobe in the region of the small acute to subacute infarcts on the recent prior MRI. The ventricles and sulci are normal. 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 No gross hyperdense vessel. Skull: No fracture or suspicious osseous lesion. Sinuses/Orbits: Visualized paranasal sinuses and mastoid air cells are clear. Unremarkable orbits. Other: None. ASPECTS [facility] Stroke Program Early CT Score) - Ganglionic level infarction (caudate, lentiform nuclei, internal capsule, insula, M1-M3 cortex): 7 - Supraganglionic infarction (M4-M6 cortex): 3 Total score (0-10 with 10 being normal): 10 IMPRESSION: 1. No intracranial hemorrhage or definite new infarct. ASPECTS of 10. 2. Small subacute right occipital infarcts. These results were communicated to Dr. Rory Percy at 2:55 pm on 12/04/2020 by text page via the [facility] messaging system. Electronically Signed   By: Logan Bores M.D.   On: 12/04/2020 14:55    Procedures Procedures   Medications Ordered in ED Medications  sodium chloride flush (NS) 0.9 % injection 3 mL (has no administration in time range)  acetaminophen (TYLENOL) tablet 1,000 mg (1,000 mg Oral Given 12/04/20 1533)    ED Course  I have reviewed the triage vital signs and the nursing notes.  Pertinent labs & imaging results that were available during my care of the patient were reviewed by me and considered in my medical decision making (see chart for details).    MDM Rules/Calculators/A&P                           Patient is a 60 year old who presented with left side tingling and some  vision deficits after having a recently stented basilar artery aneurysm.  Code stroke was activated in triage.  Dr. Rory Percy with neurology has assessed the patient.  On chart review, it was determined that she had a recent MRI done 2 days ago that showed some acute infarcts in the right thalamus and occipital lobe.  Given this and that her headache has been going on for 2 days, she was not a candidate for thrombolytics.  Neurology requested a CTA of the head and neck.  MR does not need to be repeated per neuro. She will be admitted to the hospitalist service.  Her labs are nonconcerning.  I spoke with Dr. Roosevelt Locks who will admit the pt.  CRITICAL CARE Performed by: Malvin Johns Total critical care time: 60 minutes Critical care time was exclusive of separately billable procedures and treating other patients. Critical care was necessary to treat or prevent imminent or life-threatening deterioration. Critical care was time spent personally by me on the following activities: development of treatment plan with patient and/or surrogate as well as nursing, discussions with consultants, evaluation of patient's response to treatment, examination of patient, obtaining history from patient or  surrogate, ordering and performing treatments and interventions, ordering and review of laboratory studies, ordering and review of radiographic studies, pulse oximetry and re-evaluation of patient's condition.  Final Clinical Impression(s) / ED Diagnoses Final diagnoses:  Acute CVA (cerebrovascular accident) [facility])    Rx / [place] Orders ED Discharge Orders     None        Malvin Johns, MD 12/04/20 1546

## 2020-12-04 NOTE — ED Triage Notes (Addendum)
Pt BIB [facility] with a c/o L side numbness and problems with peripheral vision. They report symptoms started at 3:00 today. Pt with history of aneurism repair in past couple of weeks.   Code Stroke called on arrival at 1426.  Pt to ct with this RN at 14:27. Neurologist at pt side at 14:30.  Neurologist updated last known normal to 1930 yesterday related to an onset of dizziness and balance issues.   Neurologist cancels code CVA at 1445 due to CVA noted on previous MRI.

## 2020-12-04 NOTE — H&P (Signed)
History and Physical    Tiffany Horn HBZ:[id number] DOB: Apr 30, 1960 DOA: 12/04/2020  PCP: Tiffany Hillock, Horn (Confirm with patient/family/NH records and if not entered, this has to be entered at [facility] point of entry) Patient coming from: Home  I have personally briefly reviewed patient's old medical records in [place]  Chief Complaint: Left-sided weakness, blurry vision.  HPI: Tiffany Horn is Horn 60 y.o. 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  Patient underwent right-sided basilar artery aneurysm stenting on 10/21, during the procedure, patient developed partial intraoperative thrombosis within the stent, pretreatment of Integrilin x18 hours was done and patient discharged home with aspirin and Brilinta.  (before that, patient developed double vision x2 in one day on 10/18 and worsening of headaches came to the hospital and CT angiogram showed right basilar aneurysm).  7 days ago, patient started to have left-sided weakness and numbness as well as bilateral blurry vision.  She called neurosurgery's office, and had Horn MRI done 2 days ago.  This morning, patient woke up with worsening of blurry vision and persistent left-sided weakness and came to ED.  Left-sided numbness/improved upon arrival in ED.  ED Course: Blood pressure borderline elevated, neuro exam slight left-sided weakness compared to right side.  MRI from 11/3 at [facility] showed acute ischemia in the right thalamus and right occipital lobe within the right PCA territory.  CTA head and neck pending.  Review of Systems: As per HPI otherwise 14 point review of systems negative.    Past Medical History:  Diagnosis Date   Allergy    Anxiety    Arthritis    knees and spine, shoulder   Asthma     Bronchitis    hx - recurrent   Complication of anesthesia    waking up is not easy   Depression    Elevated IgE level 09/12/2017   09/12/2017 IgE 195   H/O miscarriage, not currently pregnant    Hx of irritable bowel syndrome    x2   Hyperlipidemia    diet controlled - no medication   Hypertension    not taking any meds at present - under control per patient   Hypokalemia    with PNA admission (2.5)   Migraines    Pneumonia    4 episodes; hosp. admission 2014   PONV (postoperative nausea and vomiting)    Thyroid disease    UC (ulcerative colitis) [facility])    DX'D 2021    Past Surgical History:  Procedure Laterality Date   BREAST SURGERY     implants, then had them removed   COLONOSCOPY     greater 10 yrs ago - ? [facility]-2017 LAST   DILATION AND CURETTAGE OF UTERUS     IR ANGIO INTRA EXTRACRAN SEL INTERNAL CAROTID BILAT MOD SED  11/19/2020   IR ANGIO VERTEBRAL SEL VERTEBRAL BILAT MOD SED  11/19/2020   IR ANGIOGRAM FOLLOW UP STUDY  11/19/2020   IR ANGIOGRAM FOLLOW UP STUDY  11/19/2020   IR ANGIOGRAM FOLLOW UP STUDY  11/19/2020   IR ANGIOGRAM FOLLOW UP STUDY  11/19/2020   IR ANGIOGRAM FOLLOW UP STUDY  11/19/2020   IR ANGIOGRAM FOLLOW UP STUDY  11/19/2020   IR ANGIOGRAM FOLLOW UP STUDY  11/19/2020   IR ANGIOGRAM FOLLOW UP STUDY  11/19/2020   IR INTRA CRAN STENT  11/19/2020   IR TRANSCATH/EMBOLIZ  11/19/2020   LAPAROSCOPIC ABDOMINAL EXPLORATION  01/31/1992   endometriosis   ORIF HUMERUS FRACTURE Left 04/01/2013   DR Tiffany Horn - shoulder   ORIF HUMERUS FRACTURE Left 04/01/2013   Procedure: OPEN REDUCTION INTERNAL FIXATION (ORIF) LEFT PROXIMAL HUMERUS FRACTURE;  Surgeon: Tiffany Rossetti, MD;  Location: [place];  Service: Orthopedics;  Laterality: Left;   RADIOLOGY WITH ANESTHESIA N/Horn 11/19/2020   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;  Surgeon: Tiffany Lose, MD;  Location: [place];  Service: Radiology;  Laterality: N/Horn;   TONSILLECTOMY AND  ADENOIDECTOMY       reports that she has never smoked. She has never used smokeless tobacco. She reports current alcohol use of about 2.0 standard drinks per week. She reports that she does not use drugs.  Allergies  Allergen Reactions   Abilify [Aripiprazole] Nausea And Vomiting   Amoxicillin Diarrhea   Seroquel [Quetiapine Fumarate]     Unknown    Family History  Problem Relation Age of Onset   Breast cancer Mother    Diabetes Mother    Heart disease Mother    Diabetes Father    Dementia Father    Colon cancer Father    Stroke Paternal Grandmother    Diabetes Paternal Grandmother    Colon polyps Neg Hx    Esophageal cancer Neg Hx    Stomach cancer Neg Hx    Rectal cancer Neg Hx      Prior to Admission medications   Medication Sig Start Date End Date Taking? Authorizing Provider  albuterol (VENTOLIN HFA) 108 (90 Base) MCG/ACT inhaler Inhale 2 puffs into the lungs every 6 (six) hours as needed for wheezing or shortness of breath. 05/21/19   Horn, Tiffany Horn, Horn  Ascorbic Acid (VITAMIN C) 1000 MG tablet Take 1,000 mg by mouth daily.    [provider]  aspirin 81 MG chewable tablet Chew 1 tablet (81 mg total) by mouth daily. 11/22/20   Tiffany Lose, MD  B Complex Vitamins (B COMPLEX PO) Take by mouth.    [provider]  Budeson-Glycopyrrol-Formoterol (BREZTRI AEROSPHERE) 160-9-4.8 MCG/ACT AERO Inhale 2 puffs into the lungs in the morning and at bedtime. 04/28/20   Tiffany Horn  clonazePAM (KLONOPIN) 0.5 MG tablet Take 1 tablet (0.5 mg total) by mouth 3 (three) times daily as needed for anxiety. Patient taking differently: Take 0.5 mg by mouth daily as needed for anxiety. 04/23/20   Tiffany Lank, PA-C  levothyroxine (SYNTHROID) 112 MCG tablet Take 1 tablet (112 mcg total) by mouth daily. 11/25/20   Horn, Tiffany Horn, Horn  MAGNESIUM GLYCINATE PO Take 1 tablet by mouth at bedtime.    [provider]  PARoxetine (PAXIL) 40 MG tablet Take 1  tablet (40 mg total) by mouth daily. 04/23/20   Tiffany Moat T, PA-C  rizatriptan (MAXALT) 5 MG tablet Take 1 tablet (5 mg total) by mouth daily as needed for migraine. 11/25/20   Tiffany Pouch Horn, Horn  Spacer/Aero-Holding Dorise Bullion Use daily with inhaler 04/28/20   Tiffany Horn  ticagrelor (BRILINTA) 90 MG TABS tablet Take 1 tablet (90 mg total) by mouth 2 (two) times daily. 11/22/20   Tiffany Lose, MD    Physical Exam: Vitals:   12/04/20 1505 12/04/20 1515 12/04/20 1530 12/04/20 1630  BP:  139/90 (!) 142/92 133/79  Pulse:  73 72 71  Resp:  16 16 20   Temp:      TempSrc:  SpO2:  98% 96% 99%  Weight: 84.7 kg     Height:        Constitutional: NAD, calm, comfortable Vitals:   12/04/20 1505 12/04/20 1515 12/04/20 1530 12/04/20 1630  BP:  139/90 (!) 142/92 133/79  Pulse:  73 72 71  Resp:  16 16 20   Temp:      TempSrc:      SpO2:  98% 96% 99%  Weight: 84.7 kg     Height:       Eyes: PERRL, lids and conjunctivae normal ENMT: Mucous membranes are moist. Posterior pharynx clear of any exudate or lesions.Normal dentition.  Neck: normal, supple, no masses, no thyromegaly Respiratory: clear to auscultation bilaterally, no wheezing, no crackles. Normal respiratory effort. No accessory muscle use.  Cardiovascular: Regular rate and rhythm, no murmurs / rubs / gallops. No extremity edema. 2+ pedal pulses. No carotid bruits.  Abdomen: no tenderness, no masses palpated. No hepatosplenomegaly. Bowel sounds positive.  Musculoskeletal: no clubbing / cyanosis. No joint deformity upper and lower extremities. Good ROM, no contractures. Normal muscle tone.  Skin: no rashes, lesions, ulcers. No induration Neurologic: CN 2-12 grossly intact. Sensation intact, DTR normal. Strength 5/5 in all 4.  Psychiatric: Normal judgment and insight. Alert and oriented x 3. Normal mood.     Labs on Admission: I have personally reviewed following labs and imaging studies  CBC: Recent Labs   Lab 12/04/20 1423 12/04/20 1431  WBC 5.3  --   NEUTROABS 2.5  --   HGB 13.2 13.3  HCT 39.9 39.0  MCV 91.3  --   PLT 233  --    Basic Metabolic Panel: Recent Labs  Lab 12/04/20 1423 12/04/20 1431  NA 139 141  K 3.4* 3.4*  CL 107 105  CO2 23  --   GLUCOSE 120* 123*  BUN 9 10  CREATININE 0.64 0.60  CALCIUM 8.6*  --    GFR: Estimated Creatinine Clearance: 84.1 mL/min (by C-G formula based on SCr of 0.6 mg/dL). Liver Function Tests: Recent Labs  Lab 12/04/20 1423  AST 19  ALT 21  ALKPHOS 52  BILITOT 0.5  PROT 6.3*  ALBUMIN 3.6   No results for input(s): LIPASE, AMYLASE in the last 168 hours. No results for input(s): AMMONIA in the last 168 hours. Coagulation Profile: Recent Labs  Lab 12/04/20 1423  INR 1.0   Cardiac Enzymes: No results for input(s): CKTOTAL, CKMB, CKMBINDEX, TROPONINI in the last 168 hours. BNP (last 3 results) No results for input(s): PROBNP in the last 8760 hours. HbA1C: No results for input(s): HGBA1C in the last 72 hours. CBG: Recent Labs  Lab 12/04/20 1524  GLUCAP 115*   Lipid Profile: No results for input(s): CHOL, HDL, LDLCALC, TRIG, CHOLHDL, LDLDIRECT in the last 72 hours. Thyroid Function Tests: No results for input(s): TSH, T4TOTAL, FREET4, T3FREE, THYROIDAB in the last 72 hours. Anemia Panel: No results for input(s): VITAMINB12, FOLATE, FERRITIN, TIBC, IRON, RETICCTPCT in the last 72 hours. Urine analysis:    Component Value Date/Time   COLORURINE YELLOW 11/16/2020 [place] 11/16/2020 2246   LABSPEC 1.025 11/16/2020 2246   PHURINE 6.5 11/16/2020 2246   GLUCOSEU NEGATIVE 11/16/2020 2246   HGBUR TRACE (Horn) 11/16/2020 2246   [place] 11/16/2020 2246   BILIRUBINUR neg 06/06/2019 1413   [place] 11/16/2020 2246   PROTEINUR NEGATIVE 11/16/2020 2246   UROBILINOGEN 0.2 06/06/2019 1413   UROBILINOGEN 1.0 12/27/2012 1025   NITRITE NEGATIVE 11/16/2020 2246  LEUKOCYTESUR SMALL (Horn)  11/16/2020 2246    Radiological Exams on Admission: CT Angio Head W or Wo Contrast  Result Date: 12/04/2020 CLINICAL DATA:  Neuro deficit, acute, stroke suspected recent stenting of aneurysm; Neuro deficit, acute, stroke suspected. Left-sided weakness with tingling in the arm and leg. Visual deficit with blurry vision following treatment of an aneurysm last month. EXAM: CT ANGIOGRAPHY HEAD AND NECK TECHNIQUE: Multidetector CT imaging of the head and neck was performed using the standard protocol during bolus administration of intravenous contrast. Multiplanar CT image reconstructions and MIPs were obtained to evaluate the vascular anatomy. Carotid stenosis measurements (when applicable) are obtained utilizing NASCET criteria, using the distal internal carotid diameter as the denominator. CONTRAST:  5mL OMNIPAQUE IOHEXOL 350 MG/ML SOLN COMPARISON:  CTA head and neck 11/16/2020.  Head MRA 12/02/2020. 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 Widely patent arch vessel origins. Right carotid system: Patent without evidence of stenosis, dissection, or significant atherosclerosis. Left carotid system: Patent with minimal atherosclerotic plaque at the carotid bifurcation. No evidence of stenosis or dissection. Vertebral arteries: Patent without evidence of stenosis, dissection, or significant atherosclerosis. Mildly dominant left vertebral artery. Skeleton: Moderate mid to lower cervical disc degeneration. Asymmetrically advanced left facet arthrosis at C2-3. Other neck: No evidence of cervical lymphadenopathy or mass. Upper chest: No apical lung consolidation or mass. Review of the MIP images confirms the above findings CTA HEAD FINDINGS Anterior circulation: The internal carotid arteries are widely patent from skull base to carotid termini. ACAs and MCAs are patent without evidence of  Horn proximal branch occlusion or flow limiting proximal stenosis although streak artifact obscures portions of the right A1 and proximal A2 segments. No aneurysm is identified. Posterior circulation: The intracranial vertebral arteries are widely patent to the basilar. Patent PICA and SCA origins are seen bilaterally. The basilar artery is widely patent with Horn stent noted in the distal basilar artery extending into the right PCA. There has been coiling of Horn basilar tip aneurysm with prominent associated streak artifact. No residual aneurysm filling is identified. Both PCAs are grossly patent although the P1 segments are poorly evaluated due to streak artifact. Venous sinuses: Patent. Anatomic variants: None. Review of the MIP images confirms the above findings IMPRESSION: 1. No large vessel occlusion. 2. Basilar tip aneurysm coiling without evidence of recurrent aneurysm filling. 3. Suboptimal assessment of the proximal PCAs and right ACA due to streak artifact. 4. Widely patent cervical carotid and vertebral arteries. Electronically Signed   By: Logan Bores M.D.   On: 12/04/2020 16:53   CT Angio Neck W and/or Wo Contrast  Result Date: 12/04/2020 CLINICAL DATA:  Neuro deficit, acute, stroke suspected recent stenting of aneurysm; Neuro deficit, acute, stroke suspected. Left-sided weakness with tingling in the arm and leg. Visual deficit with blurry vision following treatment of an aneurysm last month. EXAM: CT ANGIOGRAPHY HEAD AND NECK TECHNIQUE: Multidetector CT imaging of the head and neck was performed using the standard protocol during bolus administration of intravenous contrast. Multiplanar CT image reconstructions and MIPs were obtained to evaluate the vascular anatomy. Carotid stenosis measurements (when applicable) are obtained utilizing NASCET criteria, using the distal internal carotid diameter as the denominator. CONTRAST:  18mL OMNIPAQUE IOHEXOL 350 MG/ML SOLN COMPARISON:  CTA head and neck  11/16/2020.  Head MRA 12/02/2020. FINDINGS: CTA NECK FINDINGS Aortic arch: Normal variant aortic arch branching pattern with aberrant right subclavian artery  coursing posterior to the esophagus and trachea. Widely patent arch vessel origins. Right carotid system: Patent without evidence of stenosis, dissection, or significant atherosclerosis. Left carotid system: Patent with minimal atherosclerotic plaque at the carotid bifurcation. No evidence of stenosis or dissection. Vertebral arteries: Patent without evidence of stenosis, dissection, or significant atherosclerosis. Mildly dominant left vertebral artery. Skeleton: Moderate mid to lower cervical disc degeneration. Asymmetrically advanced left facet arthrosis at C2-3. Other neck: No evidence of cervical lymphadenopathy or mass. Upper chest: No apical lung consolidation or mass. Review of the MIP images confirms the above findings CTA HEAD FINDINGS Anterior circulation: The internal carotid arteries are widely patent from skull base to carotid termini. ACAs and MCAs are patent without evidence of Horn proximal branch occlusion or flow limiting proximal stenosis although streak artifact obscures portions of the right A1 and proximal A2 segments. No aneurysm is identified. Posterior circulation: The intracranial vertebral arteries are widely patent to the basilar. Patent PICA and SCA origins are seen bilaterally. The basilar artery is widely patent with Horn stent noted in the distal basilar artery extending into the right PCA. There has been coiling of Horn basilar tip aneurysm with prominent associated streak artifact. No residual aneurysm filling is identified. Both PCAs are grossly patent although the P1 segments are poorly evaluated due to streak artifact. Venous sinuses: Patent. Anatomic variants: None. Review of the MIP images confirms the above findings IMPRESSION: 1. No large vessel occlusion. 2. Basilar tip aneurysm coiling without evidence of recurrent aneurysm  filling. 3. Suboptimal assessment of the proximal PCAs and right ACA due to streak artifact. 4. Widely patent cervical carotid and vertebral arteries. Electronically Signed   By: Logan Bores M.D.   On: 12/04/2020 16:53   MR ANGIO HEAD WO CONTRAST  Result Date: 12/02/2020 CLINICAL DATA:  Cerebral aneurysm EXAM: MRI HEAD WITHOUT CONTRAST MRA HEAD WITHOUT CONTRAST TECHNIQUE: Multiplanar, multi-echo pulse sequences of the brain and surrounding structures were acquired without intravenous contrast. Angiographic images of the Circle of Willis were acquired using MRA technique without intravenous contrast. COMPARISON:  No pertinent prior exam. FINDINGS: MRI HEAD FINDINGS Brain: Small foci of abnormal diffusion restriction in the right thalamus and right occipital lobe. No acute or chronic hemorrhage. Normal white matter signal, parenchymal volume and CSF spaces. The midline structures are normal. Vascular: Major flow voids are preserved. Skull and upper cervical spine: Normal calvarium and skull base. Visualized upper cervical spine and soft tissues are normal. Sinuses/Orbits:No paranasal sinus fluid levels or advanced mucosal thickening. No mastoid or middle ear effusion. Normal orbits. MRA HEAD FINDINGS POSTERIOR CIRCULATION: --Vertebral arteries: Normal --Inferior cerebellar arteries: Normal. --Basilar artery: Coil mass near the tip of the basilar artery. There is no visible residual aneurysm filling. --Superior cerebellar arteries: Normal. --Posterior cerebral arteries: The right P1 segment is poorly visualized due to susceptibility effects from the basilar coil mass. Left PCA is normal. The distal right PCA is patent. ANTERIOR CIRCULATION: --Intracranial internal carotid arteries: Normal. --Anterior cerebral arteries (ACA): Normal. --Middle cerebral arteries (MCA): Normal. ANATOMIC VARIANTS: None IMPRESSION: 1. Small foci of acute ischemia in the right thalamus and right occipital lobe, both within the right PCA  territory. 2. Coil mass near the tip of the basilar artery. No residual aneurysm filling identified. Electronically Signed   By: Ulyses Jarred M.D.   On: 12/02/2020 22:57   MR BRAIN WO CONTRAST  Result Date: 12/02/2020 CLINICAL DATA:  Cerebral aneurysm EXAM: MRI HEAD WITHOUT CONTRAST MRA HEAD WITHOUT CONTRAST TECHNIQUE: Multiplanar, multi-echo pulse  sequences of the brain and surrounding structures were acquired without intravenous contrast. Angiographic images of the Circle of Willis were acquired using MRA technique without intravenous contrast. COMPARISON:  No pertinent prior exam. FINDINGS: MRI HEAD FINDINGS Brain: Small foci of abnormal diffusion restriction in the right thalamus and right occipital lobe. No acute or chronic hemorrhage. Normal white matter signal, parenchymal volume and CSF spaces. The midline structures are normal. Vascular: Major flow voids are preserved. Skull and upper cervical spine: Normal calvarium and skull base. Visualized upper cervical spine and soft tissues are normal. Sinuses/Orbits:No paranasal sinus fluid levels or advanced mucosal thickening. No mastoid or middle ear effusion. Normal orbits. MRA HEAD FINDINGS POSTERIOR CIRCULATION: --Vertebral arteries: Normal --Inferior cerebellar arteries: Normal. --Basilar artery: Coil mass near the tip of the basilar artery. There is no visible residual aneurysm filling. --Superior cerebellar arteries: Normal. --Posterior cerebral arteries: The right P1 segment is poorly visualized due to susceptibility effects from the basilar coil mass. Left PCA is normal. The distal right PCA is patent. ANTERIOR CIRCULATION: --Intracranial internal carotid arteries: Normal. --Anterior cerebral arteries (ACA): Normal. --Middle cerebral arteries (MCA): Normal. ANATOMIC VARIANTS: None IMPRESSION: 1. Small foci of acute ischemia in the right thalamus and right occipital lobe, both within the right PCA territory. 2. Coil mass near the tip of the basilar  artery. No residual aneurysm filling identified. Electronically Signed   By: Ulyses Jarred M.D.   On: 12/02/2020 22:57   CT HEAD CODE STROKE WO CONTRAST  Result Date: 12/04/2020 CLINICAL DATA:  Code stroke. Neuro deficit, acute, stroke suspected. Left-sided weakness with tingling in the arm and leg. Visual deficit with blurry vision following treatment of an aneurysm last month. EXAM: CT HEAD WITHOUT CONTRAST TECHNIQUE: Contiguous axial images were obtained from the base of the skull through the vertex without intravenous contrast. COMPARISON:  Head MRI 12/02/2020 FINDINGS: Brain: There is no evidence of an acute large territory infarct, intracranial hemorrhage, mass, midline shift, or extra-axial fluid collection. There is slight hypoattenuation in the right occipital lobe in the region of the small acute to subacute infarcts on the recent prior MRI. The ventricles and sulci are normal. Vascular: Stent assisted coiling of Horn basilar aneurysm. No gross hyperdense vessel. Skull: No fracture or suspicious osseous lesion. Sinuses/Orbits: Visualized paranasal sinuses and mastoid air cells are clear. Unremarkable orbits. Other: None. ASPECTS [facility] Stroke Program Early CT Score) - Ganglionic level infarction (caudate, lentiform nuclei, internal capsule, insula, M1-M3 cortex): 7 - Supraganglionic infarction (M4-M6 cortex): 3 Total score (0-10 with 10 being normal): 10 IMPRESSION: 1. No intracranial hemorrhage or definite new infarct. ASPECTS of 10. 2. Small subacute right occipital infarcts. These results were communicated to Dr. Rory Percy at 2:55 pm on 12/04/2020 by text page via the [facility] messaging system. Electronically Signed   By: Logan Bores M.D.   On: 12/04/2020 14:55    EKG: Independently reviewed.  Sinus, no acute ST-Horn changes no PR or QTc interval changes  Assessment/Plan Active Problems:   CVA (cerebral vascular accident) ([place])  (please populate well all problems here in Problem List. (For example, if  patient is on BP meds at home and you resume or decide to hold them, it is Horn problem that needs to be her. Same for CAD, COPD, HLD and so on)  Acute embolic stroke -With left-sided paresis and blurry vision, improving. -MRI showed right thalamus and right occipital acute stroke, corresponding to patient main symptoms. -Discussed with neurosurgery over the phone, recommend start patient on amlodipine 60  mg every 6 hours to prevent vasospasm, continue aspirin and Brilinta. -Telemetry monitoring x24 hours to rule out PAF.  Neurology on board, further outpatient Zio patch monitoring as per neurology. -A1c, lipid panel, PT evaluation.  Expect home PT.  Hypothyroidism -Continue Synthroid  Mild intermittent asthma -Stable.  Migraines -Stable, continue as needed medications.  DVT prophylaxis: Lovenox Code Status: Full code Family Communication: None at bedside Disposition Plan: Expect 1 to 2 days hospital stay. Consults called: Neurology and neurosurgery. Admission status: Tele admit   Lequita Halt MD [facility] Pager [phone]  12/04/2020, 5:58 PM

## 2020-12-04 NOTE — Consult Note (Signed)
Neurology Consultation  Reason for Consult: Code stroke, left-sided tingling Referring Physician: Dr. Tamera Punt  CC: Left-sided tingling  History is obtained from: Patient, chart  HPI: Tiffany Horn is a 60 y.o. female past medical history of hypertension, hyperlipidemia, migraines, recent basilar artery stent supported coil embolization, presenting with complaints of dizziness, presented to the emergency room for evaluation of left-sided tingling numbness and dizziness with last known well at 7:30 PM yesterday. 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  She has had headaches as well as feelings of dizziness and imbalance but she was doing reasonably well till 7:30 PM yesterday when she had an episode of dizziness and feeling imbalance.  She did not move much, remained in bed.  Upon waking up this morning she continued to feel unwell and at some point also started to feel some tingling on the left side of her body. She has been feeling off and on dizziness and sensation of being imbalance for the past few days.  She was sent for an MRI that she got done on 12/02/2020 at [facility].  She did not know the results of that. Denies any current headaches at this time. EMS was called to her house, they were under the impression that the last known well was sometime within the [place] K window in the activated a code stroke.    LKW: 30 p.m. yesterday tpa given?: no, outside the window Premorbid modified Rankin scale (mRS): 0 ROS: Full ROS was performed and is negative except as noted in the HPI.   Past Medical History:  Diagnosis Date   Allergy    Anxiety    Arthritis    knees and spine, shoulder   Asthma    Bronchitis    hx - recurrent   Complication of anesthesia    waking up is not easy   Depression    Elevated IgE level 09/12/2017   09/12/2017 IgE 195   H/O miscarriage, not currently pregnant     Hx of irritable bowel syndrome    x2   Hyperlipidemia    diet controlled - no medication   Hypertension    not taking any meds at present - under control per patient   Hypokalemia    with PNA admission (2.5)   Migraines    Pneumonia    4 episodes; hosp. admission 2014   PONV (postoperative nausea and vomiting)    Thyroid disease    UC (ulcerative colitis) ([place])    DX'D 2021        Family History  Problem Relation Age of Onset   Breast cancer Mother    Diabetes Mother    Heart disease Mother    Diabetes Father    Dementia Father    Colon cancer Father    Stroke Paternal Grandmother    Diabetes Paternal Grandmother    Colon polyps Neg Hx    Esophageal cancer Neg Hx    Stomach cancer Neg Hx    Rectal cancer Neg Hx      Social History:   reports that she has never smoked. She has never used smokeless tobacco. She reports current alcohol use of about 2.0 standard drinks per week. She reports that she does not use drugs.  Medications  Current Facility-Administered Medications:    0.9 %  sodium chloride infusion, 500 mL, Intravenous, Once, Nandigam, Kavitha V, MD   sodium chloride flush (NS) 0.9 %  injection 3 mL, 3 mL, Intravenous, Once, Malvin Johns, MD  Current Outpatient Medications:    albuterol (VENTOLIN HFA) 108 (90 Base) MCG/ACT inhaler, Inhale 2 puffs into the lungs every 6 (six) hours as needed for wheezing or shortness of breath., Disp: 6.7 g, Rfl: 5   Ascorbic Acid (VITAMIN C) 1000 MG tablet, Take 1,000 mg by mouth daily., Disp: , Rfl:    aspirin 81 MG chewable tablet, Chew 1 tablet (81 mg total) by mouth daily., Disp: 30 tablet, Rfl: 3   B Complex Vitamins (B COMPLEX PO), Take by mouth., Disp: , Rfl:    Budeson-Glycopyrrol-Formoterol (BREZTRI AEROSPHERE) 160-9-4.8 MCG/ACT AERO, Inhale 2 puffs into the lungs in the morning and at bedtime., Disp: 5.9 g, Rfl: 1   clonazePAM (KLONOPIN) 0.5 MG tablet, Take 1 tablet (0.5 mg total) by mouth 3 (three) times daily as  needed for anxiety. (Patient taking differently: Take 0.5 mg by mouth daily as needed for anxiety.), Disp: 90 tablet, Rfl: 5   levothyroxine (SYNTHROID) 112 MCG tablet, Take 1 tablet (112 mcg total) by mouth daily., Disp: 30 tablet, Rfl: 0   MAGNESIUM GLYCINATE PO, Take 1 tablet by mouth at bedtime., Disp: , Rfl:    PARoxetine (PAXIL) 40 MG tablet, Take 1 tablet (40 mg total) by mouth daily., Disp: 90 tablet, Rfl: 1   rizatriptan (MAXALT) 5 MG tablet, Take 1 tablet (5 mg total) by mouth daily as needed for migraine., Disp: 10 tablet, Rfl: 5   Spacer/Aero-Holding Chambers DEVI, Use daily with inhaler, Disp: 1 each, Rfl: 0   ticagrelor (BRILINTA) 90 MG TABS tablet, Take 1 tablet (90 mg total) by mouth 2 (two) times daily., Disp: 60 tablet, Rfl: 3   Exam: Current vital signs: BP (!) 142/92   Pulse 72   Temp 98.5 F (36.9 C) (Oral) Comment: Simultaneous filing. User may not have seen previous data. Comment (Src): Simultaneous filing. User may not have seen previous data.  Resp 16   Ht 5\' 6"  (1.676 m)   Wt 84.7 kg   LMP 01/31/2007   SpO2 96%   BMI 30.14 kg/m  Vital signs in last 24 hours: Temp:  [98.5 F (36.9 C)] 98.5 F (36.9 C) (11/05 1504) Pulse Rate:  [72-73] 72 (11/05 1530) Resp:  [14-16] 16 (11/05 1530) BP: (139-145)/(90-99) 142/92 (11/05 1530) SpO2:  [96 %-98 %] 96 % (11/05 1530) Weight:  [84.7 kg] 84.7 kg (11/05 1505) General: Awake alert in no distress HEENT: Normocephalic/atraumatic Lungs: Clear Prevascular: Regular rhythm Abdomen nondistended nontender Extremities warm well perfused Neurological Awake alert oriented x3 Speech is nondysarthric Follows all commands Naming repetition comprehension intact Fluency intact Cranial nerve examination with no pupillary asymmetry, visual field examination with subtle left lower quadrantanopsia, extraocular movements reveal bidirectional nystagmus that is fatigable in both directions, face symmetric, tongue and palate  midline. Motor examination with subtle left upper and lower extremity weakness.  Right side normal Sensation: Intact to light touch with subjective sensation of tingling on the left face arm and leg. Coordination with mild left upper and lower extremity dysmetria NIH stroke scale 1a Level of Conscious.: 0 1b LOC Questions: 0 1c LOC Commands: 0 2 Best Gaze: 0 3 Visual: 1 4 Facial Palsy: 0 5a Motor Arm - left: 1 5b Motor Arm - Right: 0 6a Motor Leg - Left: 1 6b Motor Leg - Right: 0 7 Limb Ataxia: 2 8 Sensory: 1 9 Best Language: 0 10 Dysarthria: 0 11 Extinct. and Inatten.: 0 TOTAL: 6  Labs  I have reviewed labs in epic and the results pertinent to this consultation are:   CBC    Component Value Date/Time   WBC 6.9 11/20/2020 0724   RBC 3.99 11/20/2020 0724   HGB 13.3 12/04/2020 1431   HCT 39.0 12/04/2020 1431   PLT 210 11/20/2020 0724   MCV 92.7 11/20/2020 0724   MCV 91.9 03/14/2013 1042   MCH 30.8 11/20/2020 0724   MCHC 33.2 11/20/2020 0724   RDW 12.9 11/20/2020 0724   LYMPHSABS 2.4 11/16/2020 2246   MONOABS 0.6 11/16/2020 2246   EOSABS 0.2 11/16/2020 2246   BASOSABS 0.1 11/16/2020 2246    CMP     Component Value Date/Time   NA 141 12/04/2020 1431   K 3.4 (L) 12/04/2020 1431   CL 105 12/04/2020 1431   CO2 26 11/16/2020 2246   GLUCOSE 123 (H) 12/04/2020 1431   BUN 10 12/04/2020 1431   CREATININE 0.60 12/04/2020 1431   CREATININE 0.92 06/06/2019 1435   CALCIUM 9.2 11/16/2020 2246   PROT 7.0 11/16/2020 2246   ALBUMIN 3.9 11/16/2020 2246   AST 19 11/16/2020 2246   ALT 19 11/16/2020 2246   ALKPHOS 57 11/16/2020 2246   BILITOT 0.3 11/16/2020 2246   GFRNONAA >60 11/16/2020 2246   GFRNONAA 76 09/29/2016 1537   GFRAA >60 10/19/2019 1208   GFRAA 87 09/29/2016 1537    Lipid Panel     Component Value Date/Time   CHOL 214 (H) 09/03/2019 1044   TRIG 152.0 (H) 09/03/2019 1044   HDL 51.90 09/03/2019 1044   CHOLHDL 4 09/03/2019 1044   VLDL 30.4 09/03/2019 1044    LDLCALC 131 (H) 09/03/2019 1044   LDLCALC 172 (H) 05/21/2019 1442     Imaging I have reviewed the images obtained: CT head with no acute changes.  Subacute right PCA infarcts. MRI brain and MRA head from 12/02/2020-small foci of ischemia within the right thalamus and right occipital lobe within the right PCA territory.  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  No residual aneurysm filling identified.    Assessment:  60 year old with a recent basilar aneurysm stent assisted coiling presenting with multiple days of dizziness, visual symptoms noted to have right PCA territory infarcts including the occipital lobe and thalamus-that were present on an MRI done 2 days ago. She was not aware of these results. Not a candidate for tPA due to symptoms being ongoing for a few days Not a candidate for thrombectomy again being outside the window. Stroke likely periprocedural but will admit for further risk factor work-up  Recommendations: Admit to hospitalist Frequent neurochecks Continue antiplatelets as advised by neurosurgery-aspirin and Brilinta Telemetry 2D echo A1c Lipid panel CT angiography head and neck PT OT speech therapy Allow for permissive hypertension-given recent stent coiling, I would keep her blood pressure higher limit to no more than 180. Plan discussed with Dr. Tamera Punt  -- Amie Portland, MD Neurologist [facility] Pager: [phone]

## 2020-12-05 ENCOUNTER — Encounter (HOSPITAL_COMMUNITY): Payer: Self-pay | Admitting: Internal Medicine

## 2020-12-05 ENCOUNTER — Other Ambulatory Visit: Payer: Self-pay

## 2020-12-05 ENCOUNTER — Inpatient Hospital Stay (HOSPITAL_COMMUNITY): Payer: BC Managed Care – PPO

## 2020-12-05 DIAGNOSIS — I725 Aneurysm of other precerebral arteries: Secondary | ICD-10-CM

## 2020-12-05 DIAGNOSIS — F419 Anxiety disorder, unspecified: Secondary | ICD-10-CM

## 2020-12-05 DIAGNOSIS — E785 Hyperlipidemia, unspecified: Secondary | ICD-10-CM

## 2020-12-05 DIAGNOSIS — Z6830 Body mass index (BMI) 30.0-30.9, adult: Secondary | ICD-10-CM

## 2020-12-05 DIAGNOSIS — F32A Depression, unspecified: Secondary | ICD-10-CM

## 2020-12-05 DIAGNOSIS — I6389 Other cerebral infarction: Secondary | ICD-10-CM

## 2020-12-05 DIAGNOSIS — E669 Obesity, unspecified: Secondary | ICD-10-CM

## 2020-12-05 LAB — HEMOGLOBIN A1C
Hgb A1c MFr Bld: 5.4 % (ref 4.8–5.6)
Mean Plasma Glucose: 108.28 mg/dL

## 2020-12-05 LAB — LIPID PANEL
Cholesterol: 227 mg/dL — ABNORMAL HIGH (ref 0–200)
HDL: 48 mg/dL (ref >40)
LDL Cholesterol: 156 mg/dL — ABNORMAL HIGH (ref 0–99)
Total CHOL/HDL Ratio: 4.7 RATIO
Triglycerides: 114 mg/dL (ref <150)
VLDL: 23 mg/dL (ref 0–40)

## 2020-12-05 LAB — ECHOCARDIOGRAM COMPLETE
Area-P 1/2: 3.31 cm2
Height: 66 in
S' Lateral: 2.3 cm
Weight: 2987.67 oz

## 2020-12-05 MED ORDER — ATORVASTATIN CALCIUM 80 MG PO TABS
80.0000 mg | ORAL_TABLET | Freq: Every day | ORAL | Status: DC
  Administered 2020-12-05 – 2020-12-06 (×2): 80 mg via ORAL
  Filled 2020-12-05: qty 1
  Filled 2020-12-05: qty 2

## 2020-12-05 MED ORDER — IBUPROFEN 400 MG PO TABS: 200.0000 mg | ORAL_TABLET | Freq: Four times a day (QID) | ORAL | Status: DC | PRN

## 2020-12-05 NOTE — Progress Notes (Signed)
  Echocardiogram 2D Echocardiogram has been performed.  Tiffany Horn 12/05/2020, 8:29 AM

## 2020-12-05 NOTE — ED Notes (Signed)
Pt resting comfortably at this time when RN rounded on pt. Visible chest rise and fall noted. VS WNL.

## 2020-12-05 NOTE — Progress Notes (Signed)
Patient is status post recently recent coil/stent embolization of basilar apex aneurysm.  Patient with some visual symptoms and mild left-sided sensory changes with some associated weakness.  MRI scanning demonstrates small thalamic and right occipital lobe hypoperfusion worrisome for infarct.  CT angiogram demonstrates stable appearance of coiling/stenting.  At this point no additional interventions are recommended other than observation and continued antiplatelet therapy with aspirin and Brilinta.  Dr. Kathyrn Sheriff to see in the morning.

## 2020-12-05 NOTE — Progress Notes (Signed)
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  Neurosurgery and neurology consulted.  CTA head and neck without LVO but suboptimal assessment of proximal and PCA and right ACA due to streak artifacts.  Basilar tip aneurysm coiling without evidence of recurrent aneurysm feeling.  TTE without significant finding.  LDL 156.  A1c 5.1%.  Already on Praluent and aspirin.  Started on Lipitor 80 mg daily as well.   Subjective: Seen and examined earlier this morning.  No major events overnight of this morning.  Reports improvement in weakness and numbness.  Still with blurry vision bilaterally.  Does not change with covering one eye or the other.  Also reports frontal headache between her eyes but not typical of her migraine.  Objective: Vitals:   12/05/20 1000 12/05/20 1100 12/05/20 1130 12/05/20 1200  BP: (!) 142/98 123/81 120/84 (!) 133/108  Pulse: 72 68 62 63  Resp: 15 15 17 15   Temp:      TempSrc:      SpO2: 97% 98% 98% 97%  Weight:      Height:       No intake or output data in the 24 hours ending 12/05/20 1324 Filed Weights   12/04/20 1505  Weight: 84.7 kg    Examination:  GENERAL: No apparent distress.  Nontoxic. HEENT: MMM.  Vision and hearing grossly intact.  NECK: Supple.  No apparent JVD.  RESP:  No IWOB.  Fair aeration bilaterally. CVS:  RRR. Heart sounds normal.  ABD/GI/GU: BS+. Abd soft, NTND.  MSK/EXT:  Moves extremities. No apparent deformity. No edema.   SKIN: no apparent skin lesion or wound NEURO: Awake, alert and oriented appropriately. Speech clear. Cranial nerves II-XII grossly intact except for blurry vision. Motor 5/5 in all muscle groups of UE and LE bilaterally, Normal tone. Light sensation intact in all dermatomes of upper and lower ext bilaterally. Patellar reflex symmetric.  No pronator drift.  Finger to nose intact. PSYCH: Calm. Normal affect.   Procedures:  None  Microbiology summarized: [id number] and influenza PCR nonreactive.  Assessment & Plan: Acute CVA with blurry vision, left paresthesia and paresis-improving.  MRI, CTA head and neck and TTE as above.  Still with blurry vision but paresthesia and paresis seems to have resolved.  LDL 156.  A1c 5.1%. -Appreciate input by neurosurgery -Neurology following-we will follow recommendations. 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 -Neurosurgery following -Continue Brilinta and aspirin.   Hypothyroidism: Stable -Continue Synthroid   Mild intermittent asthma: Stable -Stable.   Migraines: Stable -Stable, continue as needed medications.  Hyperlipidemia -Started statin.  Anxiety and depression: Stable. -Continue home meds  Class I obesity Body mass index is 30.14 kg/m.         DVT prophylaxis:  enoxaparin (LOVENOX) injection 40 mg Start: 12/04/20 1715  Code Status: Full code Family Communication: Patient and/or RN. Available if any question.  Level of care: Telemetry Medical Status is:  Inpatient  Remains inpatient appropriate because: Acute CVA with blurry vision, left paresis and paresthesia       Consultants:  Neurology Neurosurgery   Sch Meds:  Scheduled Meds:   stroke: mapping our early stages of recovery book   Does not apply Once   aspirin  81 mg Oral Daily   atorvastatin  80 mg Oral Daily   enoxaparin (LOVENOX) injection  40  mg Subcutaneous Q24H   fluticasone furoate-vilanterol  1 puff Inhalation Daily   And   umeclidinium bromide  1 puff Inhalation Daily   levothyroxine  112 mcg Oral Daily   niMODipine  60 mg Oral Q6H   PARoxetine  40 mg Oral Daily   sodium chloride flush  3 mL Intravenous Once   ticagrelor  90 mg Oral BID   Continuous Infusions:  sodium chloride     PRN Meds:.acetaminophen **OR** acetaminophen (TYLENOL) oral liquid 160 mg/5 mL **OR** acetaminophen, albuterol, clonazePAM, hydrALAZINE, ibuprofen, senna-docusate, SUMAtriptan  Antimicrobials: Anti-infectives (From admission, onward)    None        I have personally reviewed the following labs and images: CBC: Recent Labs  Lab 12/04/20 1423 12/04/20 1431  WBC 5.3  --   NEUTROABS 2.5  --   HGB 13.2 13.3  HCT 39.9 39.0  MCV 91.3  --   PLT 233  --    BMP &GFR Recent Labs  Lab 12/04/20 1423 12/04/20 1431  NA 139 141  K 3.4* 3.4*  CL 107 105  CO2 23  --   GLUCOSE 120* 123*  BUN 9 10  CREATININE 0.64 0.60  CALCIUM 8.6*  --    Estimated Creatinine Clearance: 84.1 mL/min (by C-G formula based on SCr of 0.6 mg/dL). Liver & Pancreas: Recent Labs  Lab 12/04/20 1423  AST 19  ALT 21  ALKPHOS 52  BILITOT 0.5  PROT 6.3*  ALBUMIN 3.6   No results for input(s): LIPASE, AMYLASE in the last 168 hours. No results for input(s): AMMONIA in the last 168 hours. Diabetic: Recent Labs    12/05/20 0434  HGBA1C 5.4   Recent Labs  Lab 12/04/20 1524  GLUCAP 115*   Cardiac Enzymes: No results for input(s): CKTOTAL, CKMB, CKMBINDEX, TROPONINI in the last 168 hours. No results for input(s): PROBNP in the last 8760 hours. Coagulation Profile: Recent Labs  Lab 12/04/20 1423  INR 1.0   Thyroid Function Tests: No results for input(s): TSH, T4TOTAL, FREET4, T3FREE, THYROIDAB in the last 72 hours. Lipid Profile: Recent Labs    12/05/20 0435  CHOL 227*  HDL 48  LDLCALC 156*  TRIG 114  CHOLHDL 4.7   Anemia Panel: No  results for input(s): VITAMINB12, FOLATE, FERRITIN, TIBC, IRON, RETICCTPCT in the last 72 hours. Urine analysis:    Component Value Date/Time   COLORURINE YELLOW 11/16/2020 2246   APPEARANCEUR CLEAR 11/16/2020 2246   LABSPEC 1.025 11/16/2020 2246   PHURINE 6.5 11/16/2020 2246   GLUCOSEU NEGATIVE 11/16/2020 2246   HGBUR TRACE (A) 11/16/2020 2246   [place] 11/16/2020 2246   BILIRUBINUR neg 06/06/2019 1413   [place] 11/16/2020 2246   PROTEINUR NEGATIVE 11/16/2020 2246   UROBILINOGEN 0.2 06/06/2019 1413   UROBILINOGEN 1.0 12/27/2012 1025   NITRITE NEGATIVE 11/16/2020 2246   LEUKOCYTESUR SMALL (A) 11/16/2020 2246   Sepsis Labs: Invalid input(s): PROCALCITONIN, [place]  Microbiology: Recent Results (from the past 240 hour(s))  Resp Panel by RT-PCR (Flu A&B, Covid) Nasopharyngeal Swab     Status: None  Collection Time: 12/04/20  3:34 PM   Specimen: Nasopharyngeal Swab; Nasopharyngeal(NP) swabs in vial transport medium  Result Value Ref Range Status   SARS Coronavirus 2 by RT PCR NEGATIVE NEGATIVE Final    Comment: (NOTE) SARS-CoV-2 target nucleic acids are NOT DETECTED.  The SARS-CoV-2 RNA is generally detectable in upper respiratory specimens during the acute phase of infection. The lowest concentration of SARS-CoV-2 viral copies this assay can detect is 138 copies/mL. A negative result does not preclude SARS-Cov-2 infection and should not be used as the sole basis for treatment or other patient management decisions. A negative result may occur with  improper specimen collection/handling, submission of specimen other than nasopharyngeal swab, presence of viral mutation(s) within the areas targeted by this assay, and inadequate number of viral copies(<138 copies/mL). A negative result must be combined with clinical observations, patient history, and epidemiological information. The expected result is Negative.  Fact Sheet for Patients:   EntrepreneurPulse.com.au  Fact Sheet for Healthcare Providers:  IncredibleEmployment.be  This test is no t yet approved or cleared by the [place] FDA and  has been authorized for detection and/or diagnosis of SARS-CoV-2 by FDA under an Emergency Use Authorization (EUA). This EUA will remain  in effect (meaning this test can be used) for the duration of the COVID-19 declaration under Section 564(b)(1) of the Act, 21 U.S.C.section 360bbb-3(b)(1), unless the authorization is terminated  or revoked sooner.       Influenza A by PCR NEGATIVE NEGATIVE Final   Influenza B by PCR NEGATIVE NEGATIVE Final    Comment: (NOTE) The Xpert Xpress SARS-CoV-2/FLU/RSV plus assay is intended as an aid in the diagnosis of influenza from Nasopharyngeal swab specimens and should not be used as a sole basis for treatment. Nasal washings and aspirates are unacceptable for Xpert Xpress SARS-CoV-2/FLU/RSV testing.  Fact Sheet for Patients: EntrepreneurPulse.com.au  Fact Sheet for Healthcare Providers: IncredibleEmployment.be  This test is not yet approved or cleared by the [place] FDA and has been authorized for detection and/or diagnosis of SARS-CoV-2 by FDA under an Emergency Use Authorization (EUA). This EUA will remain in effect (meaning this test can be used) for the duration of the COVID-19 declaration under Section 564(b)(1) of the Act, 21 U.S.C. section 360bbb-3(b)(1), unless the authorization is terminated or revoked.  Performed at [facility], [address], [place], [place] [address]     Radiology Studies: CT Angio Head W or Wo Contrast  Result Date: 12/04/2020 CLINICAL DATA:  Neuro deficit, acute, stroke suspected recent stenting of aneurysm; Neuro deficit, acute, stroke suspected. Left-sided weakness with tingling in the arm and leg. Visual deficit with blurry vision following treatment of an  aneurysm last month. EXAM: CT ANGIOGRAPHY HEAD AND NECK TECHNIQUE: Multidetector CT imaging of the head and neck was performed using the standard protocol during bolus administration of intravenous contrast. Multiplanar CT image reconstructions and MIPs were obtained to evaluate the vascular anatomy. Carotid stenosis measurements (when applicable) are obtained utilizing NASCET criteria, using the distal internal carotid diameter as the denominator. CONTRAST:  75mL OMNIPAQUE IOHEXOL 350 MG/ML SOLN COMPARISON:  CTA head and neck 11/16/2020.  Head MRA 12/02/2020. 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 Widely patent arch vessel origins. Right carotid system: Patent without evidence of stenosis, dissection, or significant atherosclerosis. Left carotid system: Patent with minimal atherosclerotic plaque at the carotid bifurcation. No evidence of stenosis or dissection.  Vertebral arteries: Patent without evidence of stenosis, dissection, or significant atherosclerosis. Mildly dominant left vertebral artery. Skeleton: Moderate mid to lower cervical disc degeneration. Asymmetrically advanced left facet arthrosis at C2-3. Other neck: No evidence of cervical lymphadenopathy or mass. Upper chest: No apical lung consolidation or mass. Review of the MIP images confirms the above findings CTA HEAD FINDINGS Anterior circulation: The internal carotid arteries are widely patent from skull base to carotid termini. ACAs and MCAs are patent without evidence of a proximal branch occlusion or flow limiting proximal stenosis although streak artifact obscures portions of the right A1 and proximal A2 segments. No aneurysm is identified. Posterior circulation: The intracranial vertebral arteries are widely patent to the basilar. Patent PICA and SCA origins are seen bilaterally. The basilar artery is widely  patent with a stent noted in the distal basilar artery extending into the right PCA. There has been coiling of a basilar tip aneurysm with prominent associated streak artifact. No residual aneurysm filling is identified. Both PCAs are grossly patent although the P1 segments are poorly evaluated due to streak artifact. Venous sinuses: Patent. Anatomic variants: None. Review of the MIP images confirms the above findings IMPRESSION: 1. No large vessel occlusion. 2. Basilar tip aneurysm coiling without evidence of recurrent aneurysm filling. 3. Suboptimal assessment of the proximal PCAs and right ACA due to streak artifact. 4. Widely patent cervical carotid and vertebral arteries. Electronically Signed   By: Logan Bores M.D.   On: 12/04/2020 16:53   CT Angio Neck W and/or Wo Contrast  Result Date: 12/04/2020 CLINICAL DATA:  Neuro deficit, acute, stroke suspected recent stenting of aneurysm; Neuro deficit, acute, stroke suspected. Left-sided weakness with tingling in the arm and leg. Visual deficit with blurry vision following treatment of an aneurysm last month. EXAM: CT ANGIOGRAPHY HEAD AND NECK TECHNIQUE: Multidetector CT imaging of the head and neck was performed using the standard protocol during bolus administration of intravenous contrast. Multiplanar CT image reconstructions and MIPs were obtained to evaluate the vascular anatomy. Carotid stenosis measurements (when applicable) are obtained utilizing NASCET criteria, using the distal internal carotid diameter as the denominator. CONTRAST:  49mL OMNIPAQUE IOHEXOL 350 MG/ML SOLN COMPARISON:  CTA head and neck 11/16/2020.  Head MRA 12/02/2020. 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 Widely patent arch vessel origins. Right carotid system: Patent without evidence of stenosis, dissection, or significant atherosclerosis. Left carotid  system: Patent with minimal atherosclerotic plaque at the carotid bifurcation. No evidence of stenosis or dissection. Vertebral arteries: Patent without evidence of stenosis, dissection, or significant atherosclerosis. Mildly dominant left vertebral artery. Skeleton: Moderate mid to lower cervical disc degeneration. Asymmetrically advanced left facet arthrosis at C2-3. Other neck: No evidence of cervical lymphadenopathy or mass. Upper chest: No apical lung consolidation or mass. Review of the MIP images confirms the above findings CTA HEAD FINDINGS Anterior circulation: The internal carotid arteries are widely patent from skull base to carotid termini. ACAs and MCAs are patent without evidence of a proximal branch occlusion or flow limiting proximal stenosis although streak artifact obscures portions of the right A1 and proximal A2 segments. No aneurysm is identified. Posterior circulation: The intracranial vertebral arteries are widely patent to the basilar. Patent PICA and SCA origins are seen bilaterally. The basilar artery is widely patent with a stent noted in the distal basilar artery extending into the right PCA. There has been coiling of a basilar tip  aneurysm with prominent associated streak artifact. No residual aneurysm filling is identified. Both PCAs are grossly patent although the P1 segments are poorly evaluated due to streak artifact. Venous sinuses: Patent. Anatomic variants: None. Review of the MIP images confirms the above findings IMPRESSION: 1. No large vessel occlusion. 2. Basilar tip aneurysm coiling without evidence of recurrent aneurysm filling. 3. Suboptimal assessment of the proximal PCAs and right ACA due to streak artifact. 4. Widely patent cervical carotid and vertebral arteries. Electronically Signed   By: Logan Bores M.D.   On: 12/04/2020 16:53   ECHOCARDIOGRAM COMPLETE  Result Date: 12/05/2020    ECHOCARDIOGRAM REPORT   Patient Name:   PAMILA MENDIBLES Date of Exam: 12/05/2020  Medical Rec #:  [id number]      Height:       66.0 in Accession #:    [id number]     Weight:       186.7 lb Date of Birth:  15-Dec-1960      BSA:          1.942 m Patient Age:    43 years       BP:           110/67 mmHg Patient Gender: F              HR:           73 bpm. Exam Location:  Inpatient Procedure: 2D Echo Indications:    stroke  History:        Patient has no prior history of Echocardiogram examinations.  Sonographer:    [place] Referring Phys: [id number] [place]  1. Left ventricular ejection fraction, by estimation, is 60 to 65%. The left ventricle has normal function. The left ventricle has no regional wall motion abnormalities. Left ventricular diastolic parameters were normal.  2. Right ventricular systolic function is normal. The right ventricular size is normal.  3. The mitral valve is normal in structure. No evidence of mitral valve regurgitation. No evidence of mitral stenosis.  4. The aortic valve is normal in structure. Aortic valve regurgitation is not visualized. No aortic stenosis is present.  5. The inferior vena cava is normal in size with greater than 50% respiratory variability, suggesting right atrial pressure of 3 mmHg. FINDINGS  Left Ventricle: Left ventricular ejection fraction, by estimation, is 60 to 65%. The left ventricle has normal function. The left ventricle has no regional wall motion abnormalities. The left ventricular internal cavity size was normal in size. There is  no left ventricular hypertrophy. Left ventricular diastolic parameters were normal. Right Ventricle: The right ventricular size is normal. No increase in right ventricular wall thickness. Right ventricular systolic function is normal. Left Atrium: Left atrial size was normal in size. Right Atrium: Right atrial size was normal in size. Pericardium: There is no evidence of pericardial effusion. Mitral Valve: The mitral valve is normal in structure. No evidence of mitral valve  regurgitation. No evidence of mitral valve stenosis. Tricuspid Valve: The tricuspid valve is normal in structure. Tricuspid valve regurgitation is trivial. No evidence of tricuspid stenosis. Aortic Valve: The aortic valve is normal in structure. Aortic valve regurgitation is not visualized. No aortic stenosis is present. Pulmonic Valve: The pulmonic valve was normal in structure. Pulmonic valve regurgitation is not visualized. No evidence of pulmonic stenosis. Aorta: The aortic root is normal in size and structure. Venous: The inferior vena cava is normal in size with greater than 50% respiratory variability, suggesting right atrial pressure  of 3 mmHg. IAS/Shunts: No atrial level shunt detected by color flow Doppler.  LEFT VENTRICLE PLAX 2D LVIDd:         3.60 cm   Diastology LVIDs:         2.30 cm   LV e' medial:    8.49 cm/s LV PW:         1.00 cm   LV E/e' medial:  7.5 LV IVS:        1.10 cm   LV e' lateral:   9.36 cm/s LVOT diam:     1.90 cm   LV E/e' lateral: 6.8 LV SV:         48 LV SV Index:   25 LVOT Area:     2.84 cm  RIGHT VENTRICLE             IVC RV S prime:     16.90 cm/s  IVC diam: 0.90 cm TAPSE (M-mode): 2.1 cm LEFT ATRIUM             Index        RIGHT ATRIUM           Index LA diam:        3.20 cm 1.65 cm/m   RA Area:     10.70 cm LA Vol (A2C):   27.0 ml 13.90 ml/m  RA Volume:   21.70 ml  11.17 ml/m LA Vol (A4C):   36.1 ml 18.59 ml/m LA Biplane Vol: 32.4 ml 16.68 ml/m  AORTIC VALVE LVOT Vmax:   89.30 cm/s LVOT Vmean:  58.900 cm/s LVOT VTI:    0.171 m  AORTA Ao Root diam: 3.20 cm Ao Asc diam:  3.30 cm MITRAL VALVE MV Area (PHT): 3.31 cm    SHUNTS MV Decel Time: 229 msec    Systemic VTI:  0.17 m MV E velocity: 63.60 cm/s  Systemic Diam: 1.90 cm MV A velocity: 65.60 cm/s MV E/A ratio:  0.97 Jenkins Rouge MD Electronically signed by Jenkins Rouge MD Signature Date/Time: 12/05/2020/11:04:17 AM    Final    CT HEAD CODE STROKE WO CONTRAST  Result Date: 12/04/2020 CLINICAL DATA:  Code stroke. Neuro  deficit, acute, stroke suspected. Left-sided weakness with tingling in the arm and leg. Visual deficit with blurry vision following treatment of an aneurysm last month. EXAM: CT HEAD WITHOUT CONTRAST TECHNIQUE: Contiguous axial images were obtained from the base of the skull through the vertex without intravenous contrast. COMPARISON:  Head MRI 12/02/2020 FINDINGS: Brain: There is no evidence of an acute large territory infarct, intracranial hemorrhage, mass, midline shift, or extra-axial fluid collection. There is slight hypoattenuation in the right occipital lobe in the region of the small acute to subacute infarcts on the recent prior MRI. The ventricles and sulci are normal. 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 No gross hyperdense vessel. Skull: No fracture or suspicious osseous lesion. Sinuses/Orbits: Visualized paranasal sinuses and mastoid air cells are clear. Unremarkable orbits. Other: None. ASPECTS [facility] Early CT Score) - Ganglionic level infarction (caudate, lentiform nuclei, internal capsule, insula, M1-M3 cortex): 7 - Supraganglionic infarction (M4-M6 cortex): 3 Total score (0-10 with 10 being normal): 10 IMPRESSION: 1. No intracranial hemorrhage or definite new infarct. ASPECTS of 10. 2. Small subacute right occipital infarcts. These results were communicated to Dr. Rory Percy at 2:55 pm on 12/04/2020 by text page via the [facility] messaging system. Electronically Signed   By: Logan Bores M.D.   On: 12/04/2020 14:55  Bing Duffey T. [place]  If 7PM-7AM, please contact night-coverage [email or web address] 12/05/2020, 1:24 PM

## 2020-12-05 NOTE — ED Notes (Signed)
Pt transported to echo 

## 2020-12-05 NOTE — ED Notes (Signed)
Pt ambulatory to restroom

## 2020-12-05 NOTE — Progress Notes (Signed)
STROKE TEAM PROGRESS NOTE   INTERVAL HISTORY She is sitting up on stretcher in NAD. She reports her symptoms have somewhat improved, Left side weakness, numbness and tingling has resolved, however still with dizziness with sitting up, blurry vision and intermittent headache. Will check orthostatic vitals. Will consult neurosurgery since recent coil/stent embolization of basilar aneurysm.   Vitals:   12/05/20 1000 12/05/20 1100 12/05/20 1130 12/05/20 1200  BP: (!) 142/98 123/81 120/84 (!) 133/108  Pulse: 72 68 62 63  Resp: 15 15 17 15   Temp:      TempSrc:      SpO2: 97% 98% 98% 97%  Weight:      Height:       CBC:  Recent Labs  Lab 12/04/20 1423 12/04/20 1431  WBC 5.3  --   NEUTROABS 2.5  --   HGB 13.2 13.3  HCT 39.9 39.0  MCV 91.3  --   PLT 233  --    Basic Metabolic Panel:  Recent Labs  Lab 12/04/20 1423 12/04/20 1431  NA 139 141  K 3.4* 3.4*  CL 107 105  CO2 23  --   GLUCOSE 120* 123*  BUN 9 10  CREATININE 0.64 0.60  CALCIUM 8.6*  --    Lipid Panel:  Recent Labs  Lab 12/05/20 0435  CHOL 227*  TRIG 114  HDL 48  CHOLHDL 4.7  VLDL 23  LDLCALC 156*   HgbA1c:  Recent Labs  Lab 12/05/20 0434  HGBA1C 5.4   Urine Drug Screen: No results for input(s): LABOPIA, COCAINSCRNUR, LABBENZ, AMPHETMU, THCU, LABBARB in the last 168 hours.  Alcohol Level No results for input(s): ETH in the last 168 hours.  IMAGING past 24 hours CT Angio Head W or Wo Contrast  Result Date: 12/04/2020 CLINICAL DATA:  Neuro deficit, acute, stroke suspected recent stenting of aneurysm; Neuro deficit, acute, stroke suspected. Left-sided weakness with tingling in the arm and leg. Visual deficit with blurry vision following treatment of an aneurysm last month. EXAM: CT ANGIOGRAPHY HEAD AND NECK TECHNIQUE: Multidetector CT imaging of the head and neck was performed using the standard protocol during bolus administration of intravenous contrast. Multiplanar CT image reconstructions and MIPs were  obtained to evaluate the vascular anatomy. Carotid stenosis measurements (when applicable) are obtained utilizing NASCET criteria, using the distal internal carotid diameter as the denominator. CONTRAST:  30mL OMNIPAQUE IOHEXOL 350 MG/ML SOLN COMPARISON:  CTA head and neck 11/16/2020.  Head MRA 12/02/2020. 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 Widely patent arch vessel origins. Right carotid system: Patent without evidence of stenosis, dissection, or significant atherosclerosis. Left carotid system: Patent with minimal atherosclerotic plaque at the carotid bifurcation. No evidence of stenosis or dissection. Vertebral arteries: Patent without evidence of stenosis, dissection, or significant atherosclerosis. Mildly dominant left vertebral artery. Skeleton: Moderate mid to lower cervical disc degeneration. Asymmetrically advanced left facet arthrosis at C2-3. Other neck: No evidence of cervical lymphadenopathy or mass. Upper chest: No apical lung consolidation or mass. Review of the MIP images confirms the above findings CTA HEAD FINDINGS Anterior circulation: The internal carotid arteries are widely patent from skull base to carotid termini. ACAs and MCAs are patent without evidence of a proximal branch occlusion or flow limiting proximal stenosis although streak artifact obscures portions of the right A1 and proximal A2 segments. No aneurysm is identified. Posterior circulation: The intracranial vertebral arteries are widely patent to  the basilar. Patent PICA and SCA origins are seen bilaterally. The basilar artery is widely patent with a stent noted in the distal basilar artery extending into the right PCA. There has been coiling of a basilar tip aneurysm with prominent associated streak artifact. No residual aneurysm filling is identified. Both PCAs are grossly patent although the  P1 segments are poorly evaluated due to streak artifact. Venous sinuses: Patent. Anatomic variants: None. Review of the MIP images confirms the above findings IMPRESSION: 1. No large vessel occlusion. 2. Basilar tip aneurysm coiling without evidence of recurrent aneurysm filling. 3. Suboptimal assessment of the proximal PCAs and right ACA due to streak artifact. 4. Widely patent cervical carotid and vertebral arteries. Electronically Signed   By: Logan Bores M.D.   On: 12/04/2020 16:53   CT Angio Neck W and/or Wo Contrast  Result Date: 12/04/2020 CLINICAL DATA:  Neuro deficit, acute, stroke suspected recent stenting of aneurysm; Neuro deficit, acute, stroke suspected. Left-sided weakness with tingling in the arm and leg. Visual deficit with blurry vision following treatment of an aneurysm last month. EXAM: CT ANGIOGRAPHY HEAD AND NECK TECHNIQUE: Multidetector CT imaging of the head and neck was performed using the standard protocol during bolus administration of intravenous contrast. Multiplanar CT image reconstructions and MIPs were obtained to evaluate the vascular anatomy. Carotid stenosis measurements (when applicable) are obtained utilizing NASCET criteria, using the distal internal carotid diameter as the denominator. CONTRAST:  76mL OMNIPAQUE IOHEXOL 350 MG/ML SOLN COMPARISON:  CTA head and neck 11/16/2020.  Head MRA 12/02/2020. 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 Widely patent arch vessel origins. Right carotid system: Patent without evidence of stenosis, dissection, or significant atherosclerosis. Left carotid system: Patent with minimal atherosclerotic plaque at the carotid bifurcation. No evidence of stenosis or dissection. Vertebral arteries: Patent without evidence of stenosis, dissection, or significant atherosclerosis. Mildly dominant left vertebral artery.  Skeleton: Moderate mid to lower cervical disc degeneration. Asymmetrically advanced left facet arthrosis at C2-3. Other neck: No evidence of cervical lymphadenopathy or mass. Upper chest: No apical lung consolidation or mass. Review of the MIP images confirms the above findings CTA HEAD FINDINGS Anterior circulation: The internal carotid arteries are widely patent from skull base to carotid termini. ACAs and MCAs are patent without evidence of a proximal branch occlusion or flow limiting proximal stenosis although streak artifact obscures portions of the right A1 and proximal A2 segments. No aneurysm is identified. Posterior circulation: The intracranial vertebral arteries are widely patent to the basilar. Patent PICA and SCA origins are seen bilaterally. The basilar artery is widely patent with a stent noted in the distal basilar artery extending into the right PCA. There has been coiling of a basilar tip aneurysm with prominent associated streak artifact. No residual aneurysm filling is identified. Both PCAs are grossly patent although the P1 segments are poorly evaluated due to streak artifact. Venous sinuses: Patent. Anatomic variants: None. Review of the MIP images confirms the above findings IMPRESSION: 1. No large vessel occlusion. 2. Basilar tip aneurysm coiling without evidence of recurrent aneurysm filling. 3. Suboptimal assessment of the proximal PCAs and right ACA due to streak artifact. 4. Widely patent cervical carotid and vertebral arteries. Electronically Signed   By: Logan Bores M.D.   On: 12/04/2020 16:53   ECHOCARDIOGRAM COMPLETE  Result Date: 12/05/2020    ECHOCARDIOGRAM REPORT   Patient Name:   [facility] Date of  Exam: 12/05/2020 Medical Rec #:  [id number]      Height:       66.0 in Accession #:    [id number]     Weight:       186.7 lb Date of Birth:  Jan 11, 1961      BSA:          1.942 m Patient Age:    39 years       BP:           110/67 mmHg Patient Gender: F              HR:            73 bpm. Exam Location:  Inpatient Procedure: 2D Echo Indications:    stroke  History:        Patient has no prior history of Echocardiogram examinations.  Sonographer:    Herkimer Referring Phys: [id number] [place]  1. Left ventricular ejection fraction, by estimation, is 60 to 65%. The left ventricle has normal function. The left ventricle has no regional wall motion abnormalities. Left ventricular diastolic parameters were normal.  2. Right ventricular systolic function is normal. The right ventricular size is normal.  3. The mitral valve is normal in structure. No evidence of mitral valve regurgitation. No evidence of mitral stenosis.  4. The aortic valve is normal in structure. Aortic valve regurgitation is not visualized. No aortic stenosis is present.  5. The inferior vena cava is normal in size with greater than 50% respiratory variability, suggesting right atrial pressure of 3 mmHg. FINDINGS  Left Ventricle: Left ventricular ejection fraction, by estimation, is 60 to 65%. The left ventricle has normal function. The left ventricle has no regional wall motion abnormalities. The left ventricular internal cavity size was normal in size. There is  no left ventricular hypertrophy. Left ventricular diastolic parameters were normal. Right Ventricle: The right ventricular size is normal. No increase in right ventricular wall thickness. Right ventricular systolic function is normal. Left Atrium: Left atrial size was normal in size. Right Atrium: Right atrial size was normal in size. Pericardium: There is no evidence of pericardial effusion. Mitral Valve: The mitral valve is normal in structure. No evidence of mitral valve regurgitation. No evidence of mitral valve stenosis. Tricuspid Valve: The tricuspid valve is normal in structure. Tricuspid valve regurgitation is trivial. No evidence of tricuspid stenosis. Aortic Valve: The aortic valve is normal in structure. Aortic valve regurgitation  is not visualized. No aortic stenosis is present. Pulmonic Valve: The pulmonic valve was normal in structure. Pulmonic valve regurgitation is not visualized. No evidence of pulmonic stenosis. Aorta: The aortic root is normal in size and structure. Venous: The inferior vena cava is normal in size with greater than 50% respiratory variability, suggesting right atrial pressure of 3 mmHg. IAS/Shunts: No atrial level shunt detected by color flow Doppler.  LEFT VENTRICLE PLAX 2D LVIDd:         3.60 cm   Diastology LVIDs:         2.30 cm   LV e' medial:    8.49 cm/s LV PW:         1.00 cm   LV E/e' medial:  7.5 LV IVS:        1.10 cm   LV e' lateral:   9.36 cm/s LVOT diam:     1.90 cm   LV E/e' lateral: 6.8 LV SV:         48 LV SV  Index:   25 LVOT Area:     2.84 cm  RIGHT VENTRICLE             IVC RV S prime:     16.90 cm/s  IVC diam: 0.90 cm TAPSE (M-mode): 2.1 cm LEFT ATRIUM             Index        RIGHT ATRIUM           Index LA diam:        3.20 cm 1.65 cm/m   RA Area:     10.70 cm LA Vol (A2C):   27.0 ml 13.90 ml/m  RA Volume:   21.70 ml  11.17 ml/m LA Vol (A4C):   36.1 ml 18.59 ml/m LA Biplane Vol: 32.4 ml 16.68 ml/m  AORTIC VALVE LVOT Vmax:   89.30 cm/s LVOT Vmean:  58.900 cm/s LVOT VTI:    0.171 m  AORTA Ao Root diam: 3.20 cm Ao Asc diam:  3.30 cm MITRAL VALVE MV Area (PHT): 3.31 cm    SHUNTS MV Decel Time: 229 msec    Systemic VTI:  0.17 m MV E velocity: 63.60 cm/s  Systemic Diam: 1.90 cm MV A velocity: 65.60 cm/s MV E/A ratio:  0.97 Jenkins Rouge MD Electronically signed by Jenkins Rouge MD Signature Date/Time: 12/05/2020/11:04:17 AM    Final    CT HEAD CODE STROKE WO CONTRAST  Result Date: 12/04/2020 CLINICAL DATA:  Code stroke. Neuro deficit, acute, stroke suspected. Left-sided weakness with tingling in the arm and leg. Visual deficit with blurry vision following treatment of an aneurysm last month. EXAM: CT HEAD WITHOUT CONTRAST TECHNIQUE: Contiguous axial images were obtained from the base of the  skull through the vertex without intravenous contrast. COMPARISON:  Head MRI 12/02/2020 FINDINGS: Brain: There is no evidence of an acute large territory infarct, intracranial hemorrhage, mass, midline shift, or extra-axial fluid collection. There is slight hypoattenuation in the right occipital lobe in the region of the small acute to subacute infarcts on the recent prior MRI. The ventricles and sulci are normal. 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 No gross hyperdense vessel. Skull: No fracture or suspicious osseous lesion. Sinuses/Orbits: Visualized paranasal sinuses and mastoid air cells are clear. Unremarkable orbits. Other: None. ASPECTS [facility] Stroke Program Early CT Score) - Ganglionic level infarction (caudate, lentiform nuclei, internal capsule, insula, M1-M3 cortex): 7 - Supraganglionic infarction (M4-M6 cortex): 3 Total score (0-10 with 10 being normal): 10 IMPRESSION: 1. No intracranial hemorrhage or definite new infarct. ASPECTS of 10. 2. Small subacute right occipital infarcts. These results were communicated to Dr. Rory Percy at 2:55 pm on 12/04/2020 by text page via the [facility] messaging system. Electronically Signed   By: Logan Bores M.D.   On: 12/04/2020 14:55    PHYSICAL EXAM  Temp:  [98.5 F (36.9 C)] 98.5 F (36.9 C) (11/05 1504) Pulse Rate:  [62-87] 63 (11/06 1200) Resp:  [10-20] 15 (11/06 1200) BP: (106-145)/(63-108) 133/108 (11/06 1200) SpO2:  [95 %-99 %] 97 % (11/06 1200) Weight:  [84.7 kg] 84.7 kg (11/05 1505)  General - Well nourished, well developed, in no apparent distress.  Ophthalmologic - fundi not visualized due to noncooperation.  Cardiovascular - Regular rhythm and rate.  Mental Status -  Level of arousal and orientation to time, place, and person were intact. Language including expression, naming, repetition, comprehension was assessed and found intact. Attention span and concentration were normal. Recent and remote memory were intact. Fund  of  Knowledge was assessed and was intact.  Cranial Nerves II - XII - II - Visual field intact OU, notes blurry vision bilaterally, no difference when covering either eye.. III, IV, VI - Extraocular movements intact. V - Facial sensation intact bilaterally. VII - Facial movement intact bilaterally. VIII - Hearing & vestibular intact bilaterally. X - Palate elevates symmetrically. XI - Chin turning & shoulder shrug intact bilaterally. XII - Tongue protrusion intact.  Motor Strength - The patient's strength was normal in all extremities and pronator drift was absent.  Bulk was normal and fasciculations were absent.   Motor Tone - Muscle tone was assessed at the neck and appendages and was normal.  Sensory - Light touch, temperature/pinprick were assessed and were symmetrical.    Coordination - The patient had normal movements in the hands and feet with no ataxia or dysmetria.  Tremor was absent.  Gait and Station - deferred.   ASSESSMENT/PLAN Tiffany Horn is a 60 y.o. 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  7 days ago, patient started to have left-sided weakness and numbness as well as bilateral blurry vision.  She called neurosurgery's office, and had a MRI done 2 days ago.   On day of admission patient woke up with worsening of blurry vision and persistent left-sided weakness and came to ED.  Left-sided numbness/improved upon arrival in ED.  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 ASPECTS 10.    CTA head & neck 1. No large vessel occlusion. 2. Basilar tip aneurysm coiling without evidence of recurrent aneurysm filling. 3. Suboptimal assessment  of the proximal PCAs and right ACA due to streak artifact. 4. Widely patent cervical carotid and vertebral arteries.  MRI  1. Small foci of acute ischemia in the right thalamus and right occipital lobe, both within the right PCA territory. 2. Coil mass near the tip of the basilar artery. No residual aneurysm filling identified. 2D Echo EF 60-65%. No atrial shunt LDL 156 HgbA1c 5.4 VTE prophylaxis - Lovenox/ SCD's    Diet   Diet Heart Room service appropriate? Yes; Fluid consistency: Thin   aspirin 81 mg daily and Brilinta (ticagrelor) 90 mg bid prior to admission, now on aspirin 81 mg daily and Brilinta (ticagrelor) 90 mg bid.  Therapy recommendations:  pending Disposition:  pending  Hypertension Home meds:  none Stable Permissive hypertension up to <days Long-term BP goal normotensive  Hyperlipidemia Home meds:  Praluent, not resumed in hospital LDL 156, goal < 70 Add atorvastatin 80mg    Continue statin at discharge I  Other Stroke Risk Factors Migraines  Other Active Problems Hypothyroidism Asthma  Hospital day # 1  Beulah Gandy, NP   ATTENDING ATTESTATION:  Pt with recent aneurysm coiling. MRI with new stroke. Taking aspirin and brilinta at home. Seen by neurosurgery today.  She gets dizzy on sitting up. Recommend checking orthostatics. Encouraged hydration.   Dr. Reeves Forth evaluated pt independently, reviewed imaging, chart, labs. Discussed and formulated plan with the APP. Please see APP note above for details.   Total 30 minutes spent on counseling patient and coordinating care, writing notes and reviewing chart.     To contact Stroke Continuity provider, please refer to [email or web address] After hours, contact General Neurology

## 2020-12-06 ENCOUNTER — Ambulatory Visit (HOSPITAL_COMMUNITY): Payer: BC Managed Care – PPO

## 2020-12-06 ENCOUNTER — Other Ambulatory Visit (HOSPITAL_COMMUNITY): Payer: Self-pay

## 2020-12-06 DIAGNOSIS — I10 Essential (primary) hypertension: Secondary | ICD-10-CM

## 2020-12-06 DIAGNOSIS — H538 Other visual disturbances: Secondary | ICD-10-CM

## 2020-12-06 DIAGNOSIS — R42 Dizziness and giddiness: Secondary | ICD-10-CM

## 2020-12-06 LAB — CBC
HCT: 40.8 % (ref 36.0–46.0)
Hemoglobin: 13.8 g/dL (ref 12.0–15.0)
MCH: 30.5 pg (ref 26.0–34.0)
MCHC: 33.8 g/dL (ref 30.0–36.0)
MCV: 90.1 fL (ref 80.0–100.0)
Platelets: 234 10*3/uL (ref 150–400)
RBC: 4.53 MIL/uL (ref 3.87–5.11)
RDW: 12.6 % (ref 11.5–15.5)
WBC: 6 10*3/uL (ref 4.0–10.5)
nRBC: 0 % (ref 0.0–0.2)

## 2020-12-06 LAB — MAGNESIUM: Magnesium: 2 mg/dL (ref 1.7–2.4)

## 2020-12-06 LAB — RENAL FUNCTION PANEL
Albumin: 3.5 g/dL (ref 3.5–5.0)
Anion gap: 9 (ref 5–15)
BUN: 14 mg/dL (ref 6–20)
CO2: 25 mmol/L (ref 22–32)
Calcium: 9 mg/dL (ref 8.9–10.3)
Chloride: 105 mmol/L (ref 98–111)
Creatinine, Ser: 0.72 mg/dL (ref 0.44–1.00)
GFR, Estimated: >60 mL/min (ref >60)
Glucose, Bld: 139 mg/dL — ABNORMAL HIGH (ref 70–99)
Phosphorus: 4.8 mg/dL — ABNORMAL HIGH (ref 2.5–4.6)
Potassium: 3.5 mmol/L (ref 3.5–5.1)
Sodium: 139 mmol/L (ref 135–145)

## 2020-12-06 MED ORDER — AMLODIPINE BESYLATE 10 MG PO TABS: 10.0000 mg | ORAL_TABLET | Freq: Every day | ORAL | Status: DC

## 2020-12-06 MED ORDER — AMLODIPINE BESYLATE 10 MG PO TABS
10.0000 mg | ORAL_TABLET | Freq: Every day | ORAL | 1 refills | Status: DC
  Filled 2020-12-06: qty 90, 90d supply, fill #0

## 2020-12-06 MED ORDER — POTASSIUM CHLORIDE CRYS ER 20 MEQ PO TBCR
40.0000 meq | EXTENDED_RELEASE_TABLET | Freq: Once | ORAL | Status: AC
  Administered 2020-12-06: 40 meq via ORAL
  Filled 2020-12-06: qty 2

## 2020-12-06 MED ORDER — ATORVASTATIN CALCIUM 80 MG PO TABS
80.0000 mg | ORAL_TABLET | Freq: Every day | ORAL | 1 refills | Status: DC
  Filled 2020-12-06: qty 90, 90d supply, fill #0

## 2020-12-06 NOTE — Plan of Care (Signed)
  Problem: Education: Goal: Knowledge of General Education information will improve Description: Including pain rating scale, medication(s)/side effects and non-pharmacologic comfort measures Outcome: Adequate for Discharge   Problem: Health Behavior/Discharge Planning: Goal: Ability to manage health-related needs will improve Outcome: Adequate for Discharge   Problem: Clinical Measurements: Goal: Ability to maintain clinical measurements within normal limits will improve Outcome: Adequate for Discharge Goal: Will remain free from infection Outcome: Adequate for Discharge Goal: Diagnostic test results will improve Outcome: Adequate for Discharge Goal: Respiratory complications will improve Outcome: Adequate for Discharge Goal: Cardiovascular complication will be avoided Outcome: Adequate for Discharge   Problem: Activity: Goal: Risk for activity intolerance will decrease Outcome: Adequate for Discharge   Problem: Nutrition: Goal: Adequate nutrition will be maintained Outcome: Adequate for Discharge   Problem: Coping: Goal: Level of anxiety will decrease Outcome: Adequate for Discharge   Problem: Elimination: Goal: Will not experience complications related to bowel motility Outcome: Adequate for Discharge Goal: Will not experience complications related to urinary retention Outcome: Adequate for Discharge   Problem: Pain Managment: Goal: General experience of comfort will improve Outcome: Adequate for Discharge   Problem: Safety: Goal: Ability to remain free from injury will improve Outcome: Adequate for Discharge   Problem: Skin Integrity: Goal: Risk for impaired skin integrity will decrease Outcome: Adequate for Discharge   Problem: Education: Goal: Knowledge of disease or condition will improve Outcome: Adequate for Discharge Goal: Knowledge of secondary prevention will improve (SELECT ALL) Outcome: Adequate for Discharge Goal: Knowledge of patient specific  risk factors will improve (INDIVIDUALIZE FOR PATIENT) Outcome: Adequate for Discharge Goal: Individualized Educational Video(s) Outcome: Adequate for Discharge   Problem: Coping: Goal: Will verbalize positive feelings about self Outcome: Adequate for Discharge Goal: Will identify appropriate support needs Outcome: Adequate for Discharge   Problem: Health Behavior/Discharge Planning: Goal: Ability to manage health-related needs will improve Outcome: Adequate for Discharge   Problem: Self-Care: Goal: Ability to participate in self-care as condition permits will improve Outcome: Adequate for Discharge Goal: Verbalization of feelings and concerns over difficulty with self-care will improve Outcome: Adequate for Discharge Goal: Ability to communicate needs accurately will improve Outcome: Adequate for Discharge   Problem: Nutrition: Goal: Risk of aspiration will decrease Outcome: Adequate for Discharge   Problem: Ischemic Stroke/TIA Tissue Perfusion: Goal: Complications of ischemic stroke/TIA will be minimized Outcome: Adequate for Discharge

## 2020-12-06 NOTE — TOC Transition Note (Signed)
Transition of Care [facility]) - CM/SW Discharge Note   Patient Details  Name: Tiffany Horn MRN: [id number] Date of Birth: 01/29/1961  [facility] of [facility] CM/SW Contact:  Cyndi Bender, RN Phone Number: 12/06/2020, 1:20 PM   Clinical Narrative: Patient stable for discharge. Orders for outpatient speech therapy. Spoke to patient and referral sent to [facility] outpatient rehab center.  No other needs noted.     Final next level of care: OP Rehab Barriers to Discharge: Barriers Resolved   Patient Goals and CMS Choice Patient states their goals for this hospitalization and ongoing recovery are:: return home      Discharge Placement             home          Discharge Plan and [place] with outpatient speech therapy                 Social Determinants of Health (SDOH) Interventions     Readmission Risk Interventions Readmission Risk Prevention Plan 12/06/2020  Post Dischage Appt Complete  Medication Screening Complete  Transportation Screening Complete  Some recent data might be hidden

## 2020-12-06 NOTE — Evaluation (Addendum)
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   Family history of heart disease 09/03/2019   Mild intermittent asthma 07/25/2019   Osteoporosis 06/12/2019   Elevated LFTs 06/06/2019   Hematuria 06/06/2019   Elevated IgE level 09/12/2017   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) 09/12/2017   Headache, migraine 02/15/2015   Vitamin D deficiency 10/19/2014   Encounter for preventive health examination 10/19/2014   Depression with anxiety 09/02/2014   Dyspnea on exertion 08/26/2014   Hyperlipidemia 08/18/2014   Hypothyroidism 08/18/2014   Obesity (BMI 30-39.9) 08/18/2014   Past Medical History:  Past Medical History:  Diagnosis Date   Allergy    Anxiety    Arthritis    knees and spine, shoulder   Asthma    Bronchitis    hx - recurrent   Complication of anesthesia    waking up is not easy   Depression    Elevated IgE level 09/12/2017   09/12/2017 IgE 195   H/O miscarriage, not currently pregnant    Hx of irritable bowel syndrome    x2   Hyperlipidemia    diet controlled - no medication   Hypertension    not taking any meds at present - under control per patient   Hypokalemia    with PNA admission (2.5)   Migraines    Pneumonia    4 episodes; hosp. admission 2014   PONV (postoperative nausea and vomiting)    Thyroid disease    UC (ulcerative colitis) [facility])    DX'D 2021   Past Surgical History:  Past Surgical History:  Procedure Laterality Date   BREAST SURGERY     implants, then had them removed   COLONOSCOPY     greater 10 yrs ago - ? [facility]-2017 LAST   DILATION AND CURETTAGE OF UTERUS     IR ANGIO INTRA EXTRACRAN SEL INTERNAL  CAROTID BILAT MOD SED  11/19/2020   IR ANGIO VERTEBRAL SEL VERTEBRAL BILAT MOD SED  11/19/2020   IR ANGIOGRAM FOLLOW UP STUDY  11/19/2020   IR ANGIOGRAM FOLLOW UP STUDY  11/19/2020   IR ANGIOGRAM FOLLOW UP STUDY  11/19/2020   IR ANGIOGRAM FOLLOW UP STUDY  11/19/2020   IR ANGIOGRAM FOLLOW UP STUDY  11/19/2020   IR ANGIOGRAM FOLLOW UP STUDY  11/19/2020   IR ANGIOGRAM FOLLOW UP STUDY  11/19/2020   IR ANGIOGRAM FOLLOW UP STUDY  11/19/2020   IR INTRA CRAN STENT  11/19/2020   IR TRANSCATH/EMBOLIZ  11/19/2020   LAPAROSCOPIC ABDOMINAL EXPLORATION  01/31/1992   endometriosis   ORIF HUMERUS FRACTURE Left 04/01/2013   DR Ninfa Linden - shoulder   ORIF HUMERUS FRACTURE Left 04/01/2013   Procedure: OPEN REDUCTION INTERNAL FIXATION (ORIF) LEFT PROXIMAL HUMERUS FRACTURE;  Surgeon: Mcarthur Rossetti, MD;  Location: [place];  Service: Orthopedics;  Laterality: Left;   RADIOLOGY WITH ANESTHESIA N/A 11/19/2020   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;  Surgeon: Consuella Lose, MD;  Location: [place];  Service: Radiology;  Laterality: N/A;   TONSILLECTOMY AND ADENOIDECTOMY     HPI:  Pt is 60 yo female who presented to the ED with L sided weakness and blurry vision. MRI brain: Small foci of acute ischemia in the right thalamus and  right  occipital lobe, both within the right PCA territory. 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   Assessment / Plan / Recommendation Clinical Impression  Pt participated in speech/language/cognition evaluation. 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 Pt denied any baseline deficits in speech, language, or cognition; however, she reported that she has noticed acute difficulty with short-term memory. Pt's speech and language skills were WNL. [facility] Mental Status  Examination was completed to evaluate the pt's cognitive-linguistic skills. She achieved a score of 27/30 which is within the normal limits of 27 or more out of 30. However, mild deficits were noted in the areas of memory, and executive function, and additional processing time was required for tasks which required mental flexibility. Skilled SLP services are clinically indicated at this time to improve cognitive-linguistic function. Pt currently has discharge orders; LCSW & case management have been contacted regarding recommendation for outpatient SLP services.     SLP Assessment  SLP Recommendation/Assessment: Patient needs continued [facility] SLP Visit Diagnosis: Cognitive communication deficit (R41.841)    Recommendations for follow up therapy are one component of a multi-disciplinary discharge planning process, led by the attending physician.  Recommendations may be updated based on patient status, additional functional criteria and insurance authorization.    Follow Up Recommendations  Outpatient SLP    Frequency and Duration    2 weeks      SLP Evaluation Cognition  Overall Cognitive Status: Impaired/Different from baseline Arousal/Alertness: Awake/alert Orientation Level: Oriented X4 Year: 2022 Month: November Day of Week: Correct (Date incorrect by 1 day;) Attention: Focused;Sustained Focused Attention: Appears intact Sustained Attention: Appears intact Memory: Impaired Memory Impairment: Retrieval deficit;Decreased recall of new information (Immediate: 5/5; delayed: 4/5; with cues: 1/1) Awareness: Appears intact Problem Solving: Appears intact Executive Function: Sequencing;Organizing Sequencing: Impaired Sequencing Impairment: Verbal complex (Clock drawing: 2/4) Organizing: Impaired Organizing Impairment: Verbal complex (Backward digit span: 2/2 with additional processing time.) Safety/Judgment: Appears intact       Comprehension  Auditory  Comprehension Overall Auditory Comprehension: Appears within functional limits for tasks assessed Yes/No Questions: Within Functional Limits Commands: Within Functional Limits Conversation: Complex    Expression Expression Primary Mode of Expression: Verbal Verbal Expression Overall Verbal Expression: Appears within functional limits for tasks assessed Initiation: No impairment Level of Generative/Spontaneous Verbalization: Conversation Repetition: No impairment Naming: No impairment Pragmatics: No impairment   Oral / Motor  Oral Motor/Sensory Function Overall Oral Motor/Sensory Function: Within functional limits Motor Speech Overall Motor Speech: Appears within functional limits for tasks assessed Respiration: Within functional limits Phonation: Normal Resonance: Within functional limits Articulation: Within functional limitis Intelligibility: Intelligible Motor Planning: Witnin functional limits Motor Speech Errors: Not applicable   Jahnavi Muratore I. Hardin Negus, [place], [place] Office number [phone] Pager [place] 12/06/2020, 12:29 PM

## 2020-12-06 NOTE — Evaluation (Signed)
Physical Therapy Evaluation Patient Details Name: Tiffany Horn MRN: [id number] DOB: 1960-12-16 Today's Date: 12/06/2020  History of Present Illness  Pt is 60 yo female presenting to [facility] with L sided weakness and blurry vision with acute ischemia in R thalamus and R occipital lobe noted on MRI. 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  Clinical Impression   Patient evaluated by Physical Therapy with no further acute PT needs identified. All education has been completed and the patient has no further questions. Patient scored 24/24 on Dynamic Gait Index and 48/56 on Berg Balance Assessment. She demonstrates slightly decr balance, however is able to accomodate and mobilize independently. Discussed plan to wait 1 week for follow-up with MD and if she continues to feel unsteady she will seek PT referral from MD at that time. She has seen spontaneous improvement in last couple of days. PT is signing off. Thank you for this referral.        Recommendations for follow up therapy are one component of a multi-disciplinary discharge planning process, led by the attending physician.  Recommendations may be updated based on patient status, additional functional criteria and insurance authorization.  Follow Up Recommendations No PT follow up (discussed with pt slight decr balance and plan to wait until follow-up with MD and see if additional PT warranted at that time)    Assistance Recommended at Discharge PRN  Functional Status Assessment Patient has had a recent decline in their functional status and demonstrates the ability to make significant improvements in function in a reasonable and predictable amount of time.  Equipment Recommendations  None recommended by PT    Recommendations for Other Services       Precautions / Restrictions Precautions Precautions: Fall Restrictions Weight Bearing Restrictions: No      Mobility   Bed Mobility Overal bed mobility: Independent             General bed mobility comments: no physical assist needed. no dizziness reported    Transfers Overall transfer level: Needs assistance   Transfers: Sit to/from Stand Sit to Stand: Independent           General transfer comment: supervision for safety only    Ambulation/Gait Ambulation/Gait assistance: Independent Gait Distance (Feet): 250 Feet Assistive device: None Gait Pattern/deviations: WFL(Within Functional Limits)   Gait velocity interpretation: >2.62 ft/sec, indicative of community ambulatory   General Gait Details: see DGI  Stairs            Wheelchair Mobility    Modified Rankin (Stroke Patients Only) Modified Rankin (Stroke Patients Only) Pre-Morbid Rankin Score: No symptoms Modified Rankin: No significant disability     Balance Overall balance assessment: No apparent balance deficits (not formally assessed)                               Standardized Balance Assessment Standardized Balance Assessment : Berg Balance Test;Dynamic Gait Index Berg Balance Test Sit to Stand: Able to stand without using hands and stabilize independently Standing Unsupported: Able to stand safely 2 minutes Sitting with Back Unsupported but Feet Supported on Floor or Stool: Able to sit safely and securely 2 minutes Stand to Sit: Sits safely with minimal use of hands Transfers: Able to transfer safely, minor use of hands Standing Unsupported with Eyes Closed: Able to stand 10 seconds with supervision Standing Ubsupported with Feet Together: Able to place feet together independently and stand for  1 minute with supervision From Standing, Reach Forward with Outstretched Arm: Can reach confidently >25 cm (10") From Standing Position, Pick up Object from Floor: Able to pick up shoe safely and easily From Standing Position, Turn to Look Behind Over each Shoulder: Looks behind from both sides and weight  shifts well Turn 360 Degrees: Able to turn 360 degrees safely in 4 seconds or less Standing Unsupported, Alternately Place Feet on Step/Stool: Able to complete 4 steps without aid or supervision Standing Unsupported, One Foot in Front: Able to take small step independently and hold 30 seconds Standing on One Leg: Able to lift leg independently and hold equal to or more than 3 seconds Total Score: 48 Dynamic Gait Index Level Surface: Normal Change in Gait Speed: Normal Gait with Horizontal Head Turns: Normal Gait with Vertical Head Turns: Normal Gait and Pivot Turn: Normal Step Over Obstacle: Normal Step Around Obstacles: Normal Steps: Normal Total Score: 24       Pertinent Vitals/Pain Pain Assessment: No/denies pain    Home Living Family/patient expects to be discharged to:: Private residence Living Arrangements: Alone Available Help at Discharge: Family;Friend(s);Available PRN/intermittently Type of Home: House Home Access: Stairs to enter Entrance Stairs-Rails: None Entrance Stairs-Number of Steps: 2 Alternate Level Stairs-Number of Steps: flight Home Layout: Two level;Able to live on main level with bedroom/bathroom Home Equipment: None      Prior Function Prior Level of Function : Independent/Modified Independent             Mobility Comments: no AD ADLs Comments: indep, drives, works as a Engineer, civil (consulting)   Dominant Hand: Right    Extremity/Trunk Assessment   Upper Extremity Assessment Upper Extremity Assessment: Overall WFL for tasks assessed    Lower Extremity Assessment Lower Extremity Assessment: Overall WFL for tasks assessed    Cervical / Trunk Assessment Cervical / Trunk Assessment: Normal  Communication   Communication: No difficulties  Cognition Arousal/Alertness: Awake/alert Behavior During Therapy: WFL for tasks assessed/performed Overall Cognitive Status: Within Functional Limits for tasks assessed                                           General Comments General comments (skin integrity, edema, etc.): Patient tearful about her decr memory and recommendation for f/u SLP. States she has "heightened emotions" since CVA    Exercises     Assessment/Plan    PT Assessment Patient does not need any further PT services  PT Problem List         PT Treatment Interventions      PT Goals (Current goals can be found in the Care Plan section)  Acute Rehab PT Goals Patient Stated Goal: get back to normal PT Goal Formulation: All assessment and education complete, DC therapy    Frequency     Barriers to discharge        Co-evaluation               AM-PAC PT "6 Clicks" Mobility  Outcome Measure Help needed turning from your back to your side while in a flat bed without using bedrails?: None Help needed moving from lying on your back to sitting on the side of a flat bed without using bedrails?: None Help needed moving to and from a bed to a chair (including a wheelchair)?: None Help needed standing up from a chair using  your arms (e.g., wheelchair or bedside chair)?: None Help needed to walk in hospital room?: None Help needed climbing 3-5 steps with a railing? : None 6 Click Score: 24    End of Session   Activity Tolerance: Patient tolerated treatment well Patient left: in bed;with call bell/phone within reach   PT Visit Diagnosis: Unsteadiness on feet (R26.81)    Time: 8891-6945 PT Time Calculation (min) (ACUTE ONLY): 19 min   Charges:   PT Evaluation $PT Eval Low Complexity: [place], PT Acute Rehabilitation Services  Pager [phone] Office [phone]   Rexanne Mano 12/06/2020, 12:53 PM

## 2020-12-06 NOTE — Progress Notes (Signed)
PT Cancellation Note  Patient Details Name: Tiffany Horn MRN: [id number] DOB: Jun 13, 1960   Cancelled Treatment:    Reason Eval/Treat Not Completed: Patient at procedure or test/unavailable  Patient undergoing Speech evaluation. Will reattempt    Arby Barrette, PT Acute Rehabilitation Services  Pager [phone] Office [phone]  Rexanne Mano 12/06/2020, 11:57 AM

## 2020-12-06 NOTE — Evaluation (Addendum)
Occupational Therapy Evaluation Patient Details Name: Tiffany Horn MRN: [id number] DOB: 02/01/1960 Today's Date: 12/06/2020   History of Present Illness Pt is 60 yo female presenting to [facility] with L sided weakness and blurry vision with acute ischemia in R thalamus and R occipital lobe noted on MRI. 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   Clinical Impression   Tiffany Horn was indep prior to her recent surgery, she drives and works as a Pharmacist, hospital. She lives alone in a 2 level home, 2 STE, but is able to live on the first floor with bed/bath. Upon evaluation pt completed all mobility and ADLs at a mod I/indep level, denies dizziness with no apparent LOB or unsteadiness. She has complaints of blurry vision, denies double vision however states that she intermittently experiences DV. Overall her vision was [facility] for all areas assessed, see below. Educated on BE FAST, pt verbalized great understanding. Pt does not require further OT acutely. Recommend pt follow up with her eye doctor to further assess her vision.      Recommendations for follow up therapy are one component of a multi-disciplinary discharge planning process, led by the attending physician.  Recommendations may be updated based on patient status, additional functional criteria and insurance authorization.   Follow Up Recommendations  Other (comment) (follow up with eye doctor)    Assistance Recommended at Discharge PRN  Functional Status Assessment  Patient has had a recent decline in their functional status and demonstrates the ability to make significant improvements in function in a reasonable and predictable amount of time.  Equipment Recommendations  None recommended by OT    Recommendations for Other Services       Precautions / Restrictions Precautions Precautions: Fall Restrictions Weight Bearing Restrictions: No      Mobility Bed Mobility Overal bed  mobility: Independent             General bed mobility comments: no physical assist needed. no dizziness reported    Transfers Overall transfer level: Needs assistance   Transfers: Sit to/from Stand Sit to Stand: Supervision           General transfer comment: supervision for safety only      Balance Overall balance assessment: No apparent balance deficits (not formally assessed)                                         ADL either performed or assessed with clinical judgement   ADL Overall ADL's : Modified independent;At baseline                                       General ADL Comments: mod I for incrased time, overall mod I wihtout use of device. Educated pt on strategies to increase safety within the home to reduce fall risk, pt has great understanding.     Vision Baseline Vision/History: 1 Wears glasses Ability to See in Adequate Light: 0 Adequate Patient Visual Report: Blurring of vision Vision Assessment?: Yes Eye Alignment: Within Functional Limits Ocular Range of Motion: Within Functional Limits Alignment/Gaze Preference: Within Defined Limits Tracking/Visual Pursuits: Able to track stimulus in all quads without difficulty Saccades: Within functional limits Convergence: Within functional limits Visual Fields: No apparent deficits Additional Comments: pt reports new "blurriness" of vision, able to read  clock and signs in room, and hospital menu with normal reading glasses. Pt reports intermittent double vision with last episode yesterday afternoon.     Perception Perception Perception: Within Functional Limits   Praxis      Pertinent Vitals/Pain Pain Assessment: No/denies pain     Hand Dominance     Extremity/Trunk Assessment Upper Extremity Assessment Upper Extremity Assessment: Overall WFL for tasks assessed   Lower Extremity Assessment Lower Extremity Assessment: Defer to PT evaluation   Cervical / Trunk  Assessment Cervical / Trunk Assessment: Normal   Communication Communication Communication: No difficulties   Cognition Arousal/Alertness: Awake/alert Behavior During Therapy: WFL for tasks assessed/performed Overall Cognitive Status: Within Functional Limits for tasks assessed                                       General Comments  VSS on RA, MD present during session, gave the okay to go home today    Exercises     Shoulder Instructions      Home Living Family/patient expects to be discharged to:: Private residence Living Arrangements: Alone Available Help at Discharge: Family;Friend(s);Available PRN/intermittently Type of Home: House Home Access: Stairs to enter [employer] of Steps: 2 Entrance Stairs-Rails: None Home Layout: Two level;Able to live on main level with bedroom/bathroom Alternate Level Stairs-Number of Steps: flight   Bathroom Shower/Tub: Tub/shower unit;Curtain   Bathroom Toilet: Standard     Home Equipment: None          Prior Functioning/Environment Prior Level of Function : Independent/Modified Independent             Mobility Comments: no AD ADLs Comments: indep, drives, works as a Scientist, product/process development Problem List: Decreased activity tolerance;Impaired vision/perception      OT Treatment/Interventions:      OT Goals(Current goals can be found in the care plan section) Acute Rehab OT Goals Patient Stated Goal: home today OT Goal Formulation: All assessment and education complete, DC therapy  OT Frequency:     Barriers to D/C:            Co-evaluation              AM-PAC OT "6 Clicks" Daily Activity     Outcome Measure Help from another person eating meals?: None Help from another person taking care of personal grooming?: None Help from another person toileting, which includes using toliet, bedpan, or urinal?: None Help from another person bathing (including washing, rinsing,  drying)?: None Help from another person to put on and taking off regular upper body clothing?: None Help from another person to put on and taking off regular lower body clothing?: None 6 Click Score: 24   End of Session Nurse Communication: Mobility status  Activity Tolerance: Patient tolerated treatment well Patient left: Other (comment) (in bathroom)  OT Visit Diagnosis: Low vision, both eyes (H54.2);Dizziness and giddiness (R42)                Time: 4920-1007 OT Time Calculation (min): 26 min Charges:  OT General Charges $OT Visit: 1 Visit OT Evaluation $OT Eval Moderate Complexity: 1 Mod OT Treatments $Therapeutic Activity: 8-22 mins   Carolos Fecher A Jachai Okazaki 12/06/2020, 9:42 AM

## 2020-12-06 NOTE — Plan of Care (Signed)
  Problem: Education: Goal: Knowledge of General Education information will improve Description: Including pain rating scale, medication(s)/side effects and non-pharmacologic comfort measures Outcome: Adequate for Discharge   

## 2020-12-06 NOTE — Discharge Summary (Signed)
Physician Discharge Summary  Lavaya Defreitas IZT:[id number] DOB: [birth year] DOA: 12/04/2020  PCP: [name], DO  Admit date: 12/04/2020 Discharge date: 12/06/2020 Admitted From: Home Disposition: Home Recommendations for Outpatient Follow-up:  Follow ups as below. Please obtain CBC/BMP/Mag at follow up Please follow up on the following pending results: None Home Health: Not indicated.  Outpatient PT/SLP Equipment/Devices: Not indicated Discharge Condition: Stable CODE STATUS: Full code  Follow-up Information     Kuneff, Renee A, DO. Schedule an appointment as soon as possible for a visit in 1 week(s).   Specialty: Family Medicine Contact information: [address] [place] [address] [phone]         [facility] Follow up.   Specialty: Rehabilitation Why: Call them to setup an apt Contact information: 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  Neurosurgery and neurology consulted.  CTA head and neck without LVO but suboptimal assessment of proximal and PCA and right ACA due to streak artifacts.  Basilar tip aneurysm coiling without evidence of recurrent aneurysm feeling.  TTE without significant finding.  LDL 156.  A1c 5.1%.  Already on Brilinta and aspirin.  Started on Lipitor 80 mg daily as well.  Patient's paresthesia improved.  Continues to have some residual blurry vision.  She was cleared for discharge by neurology and neurosurgery.   See individual problem list below for more on hospital course.  Discharge Diagnoses:  Acute CVA with  blurry vision, left paresthesia and paresis-improving.  MRI, CTA head and neck and TTE as above.  Still with blurry vision but paresthesia and paresis seems to have resolved.  LDL 156.  A1c 5.1%. -Cleared for discharge by neurosurgery and neurology. -Continue home Brilinta and aspirin -Added Lipitor 80 mg daily and amlodipine 10 mg daily -Outpatient therapy follow-up as above. 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 -Continue Brilinta and aspirin.   Hypothyroidism: Stable -Continue Synthroid   Mild intermittent asthma: Stable -Stable.   Migraines: Stable -Stable, continue as needed medications.   Hyperlipidemia -Started statin.   Anxiety and depression: Stable. -Continue home meds  Vertigo/dizziness-chronic.  No nystagmus on exam.  Orthostatic vitals negative. -Outpatient follow-up with therapy as above   Class I obesity Body mass index is 30.14 kg/m.           Discharge Exam: Vitals:   12/06/20 0557 12/06/20 0558 12/06/20 0750 12/06/20 0807  BP: 117/78 (!) 114/93 (!) 126/94   Pulse: 71 77 78   Temp:   97.9 F (36.6 C)   Resp: 14 18 13    Height:      Weight:      SpO2: 96% 98% 97% 99%  TempSrc:   Oral   BMI (Calculated):         GENERAL: No apparent distress.  Nontoxic. HEENT: MMM.  Vision and hearing grossly intact.  NECK: Supple.  No apparent JVD.  RESP:  No IWOB.  Fair aeration bilaterally. CVS:  RRR. Heart sounds normal.  ABD/GI/GU: Bowel sounds present. Soft. Non tender.  MSK/EXT:  Moves extremities.  No apparent deformity. No edema.  SKIN: no apparent skin lesion or wound NEURO: Awake, alert and oriented appropriately. Speech clear. Cranial nerves II-XII except for some blurry vision.  No nystagmus.  Motor 5/5 in all muscle groups of UE and LE bilaterally, Normal tone. Light sensation intact in all dermatomes of upper and lower ext bilaterally. Patellar reflex symmetric.   No pronator drift.  Finger to nose intact. PSYCH: Calm. Normal affect.   Discharge Instructions  Discharge Instructions     Ambulatory referral to Speech Therapy   Complete by: As directed    CVA   Diet - low sodium heart healthy   Complete by: As directed    Discharge instructions   Complete by: As directed    It has been a pleasure taking care of you!  You were hospitalized with blurry vision and left-sided weakness and numbness likely due to stroke.  You are already on aspirin and Brilinta.  We have started you on Lipitor (atorvastatin) for high cholesterol and reduce your future risk of stroke.  We have also started you on blood pressure medication.  Please review your new medication list and the directions on your medications before you take them.  Follow-up with your primary care doctor in 1 to 2 weeks or sooner if needed.  Follow-up with your neurosurgeon as recommended to you.  We also recommend follow-up with ophthalmology for an eye exam.   Take care,   Increase activity slowly   Complete by: As directed       Allergies as of 12/06/2020       Reactions   Abilify [aripiprazole] Nausea And Vomiting   Amoxicillin Diarrhea   Seroquel [quetiapine Fumarate]    Unknown        Medication List     TAKE these medications    albuterol 108 (90 Base) MCG/ACT inhaler Commonly known as: VENTOLIN HFA Inhale 2 puffs into the lungs every 6 (six) hours as needed for wheezing or shortness of breath.   amLODipine 10 MG tablet Commonly known as: NORVASC Take 1 tablet (10 mg total) by mouth daily.   aspirin 81 MG chewable tablet Chew 1 tablet (81 mg total) by mouth daily.   atorvastatin 80 MG tablet Commonly known as: LIPITOR Take 1 tablet (80 mg total) by mouth daily. Start taking on: December 07, 2020   B COMPLEX PO Take 1 tablet by mouth daily.   Breztri Aerosphere 160-9-4.8 MCG/ACT Aero Generic drug: Budeson-Glycopyrrol-Formoterol Inhale 2 puffs into the lungs in  the morning and at bedtime.   clonazePAM 0.5 MG tablet Commonly known as: KLONOPIN Take 1 tablet (0.5 mg total) by mouth 3 (three) times daily as needed for anxiety. What changed: when to take this   levothyroxine 112 MCG tablet Commonly known as: SYNTHROID Take 1 tablet (112 mcg total) by mouth daily.   MAGNESIUM GLYCINATE PO Take 1 tablet by mouth at bedtime.   PARoxetine 40 MG tablet Commonly known as: PAXIL Take 1 tablet (40 mg total) by mouth daily.   rizatriptan 5 MG tablet Commonly known as: MAXALT Take 1 tablet (5 mg total) by mouth daily as needed for migraine.   Spacer/Aero-Holding [employer] Use daily with inhaler   ticagrelor 90 MG Tabs tablet Commonly known as: BRILINTA Take 1 tablet (90 mg total) by mouth 2 (two) times daily.   vitamin C 1000 MG tablet Take 1,000 mg by mouth daily.        Consultations: Neurology Neurosurgery  Procedures/Studies:  CT Angio Head W or Wo Contrast  Result Date: 12/04/2020 CLINICAL DATA:  Neuro deficit, acute, stroke suspected recent stenting of aneurysm; Neuro deficit, acute, stroke suspected. Left-sided weakness with tingling in the arm and leg. Visual deficit with blurry vision following treatment of an aneurysm last month. EXAM: CT ANGIOGRAPHY HEAD AND NECK TECHNIQUE: Multidetector CT imaging of the head and neck was performed using the standard protocol during bolus administration of intravenous contrast. Multiplanar CT image reconstructions and MIPs were obtained to evaluate the vascular anatomy. Carotid stenosis measurements (when applicable) are obtained utilizing NASCET criteria, using the distal internal carotid diameter as the denominator. CONTRAST:  63mL OMNIPAQUE IOHEXOL 350 MG/ML SOLN COMPARISON:  CTA head and neck 11/16/2020.  Head MRA 12/02/2020. 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 Widely patent arch vessel origins. Right carotid system: Patent without evidence of stenosis, dissection, or significant atherosclerosis. Left carotid system: Patent with minimal atherosclerotic plaque at the carotid bifurcation. No evidence of stenosis or dissection. Vertebral arteries: Patent without evidence of stenosis, dissection, or significant atherosclerosis. Mildly dominant left vertebral artery. Skeleton: Moderate mid to lower cervical disc degeneration. Asymmetrically advanced left facet arthrosis at C2-3. Other neck: No evidence of cervical lymphadenopathy or mass. Upper chest: No apical lung consolidation or mass. Review of the MIP images confirms the above findings CTA HEAD FINDINGS Anterior circulation: The internal carotid arteries are widely patent from skull base to carotid termini. ACAs and MCAs are patent without evidence of a proximal branch occlusion or flow limiting proximal stenosis although streak artifact obscures portions of the right A1 and proximal A2 segments. No aneurysm is identified. Posterior circulation: The intracranial vertebral arteries are widely patent to the basilar. Patent PICA and SCA origins are seen bilaterally. The basilar artery is widely patent with a stent noted in the distal basilar artery extending into the right PCA. There has been coiling of a basilar tip aneurysm with prominent associated streak artifact. No residual aneurysm filling is identified. Both PCAs are grossly patent although the P1 segments are poorly evaluated due to streak artifact. Venous sinuses: Patent. Anatomic variants: None. Review of the MIP images confirms the above findings IMPRESSION: 1. No large vessel occlusion. 2. Basilar tip aneurysm coiling without evidence of recurrent aneurysm filling. 3. Suboptimal assessment of the proximal PCAs and right ACA due to streak artifact. 4. Widely patent cervical carotid and vertebral arteries. Electronically Signed   By: Logan Bores M.D.    On: 12/04/2020 16:53   CT ANGIO HEAD NECK W WO CM  Result Date: 11/17/2020 CLINICAL DATA:  Diplopia, concern for TIA versus seizure versus stroke EXAM: CT ANGIOGRAPHY HEAD AND NECK TECHNIQUE: Multidetector CT imaging of the head and neck was performed using the standard protocol during bolus administration of intravenous contrast. Multiplanar CT image reconstructions and MIPs were obtained to evaluate the vascular anatomy. Carotid stenosis measurements (when applicable) are obtained utilizing NASCET criteria, using the distal internal carotid diameter as the denominator. CONTRAST:  45mL OMNIPAQUE IOHEXOL 350 MG/ML SOLN COMPARISON:  no prior CTA, correlation is made with CT head 08/22/2010. FINDINGS: CT HEAD FINDINGS Brain: No evidence of acute infarction, hemorrhage, cerebral edema, mass, mass effect, or midline shift. Ventricles and sulci are within normal limits for age. No extra-axial fluid collection. Vascular: No hyperdense vessel or unexpected calcification. Skull: Normal. Negative for fracture or focal lesion. Sinuses/Orbits: No acute finding. Other: The mastoid air cells  are well aerated. 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 Imaged portion shows no evidence of aneurysm or dissection. No significant stenosis. Right carotid system: No evidence of dissection, stenosis (50% or greater) or occlusion. Left carotid system: No evidence of dissection, stenosis (50% or greater) or occlusion. Vertebral arteries: Codominant. No evidence of dissection, stenosis (50% or greater) or occlusion. Skeleton: Degenerative changes in the cervical spine, with straightening and mild reversal of the normal cervical lordosis. No acute osseous abnormality. Other neck: Negative. Upper chest: Negative. Review of the MIP images confirms the above findings CTA HEAD FINDINGS Anterior circulation: Both internal carotid arteries  are patent to the termini, without stenosis or other abnormality. A1 segments patent, with mild narrowing versus hypoplasia of the right A1. Normal anterior communicating artery. Anterior cerebral arteries are patent to their distal aspects. No M1 stenosis or occlusion. Normal MCA bifurcations. Distal MCA branches perfused and symmetric. Posterior circulation: 8 x 6 x 7 mm basilar tip aneurysm, with wide neck measuring 4 mm (series 15, images 119 and 120 and series 11, image 238). The aneurysm is directed superiorly and is at the origin of the right PCA. The right PCA is otherwise well perfused. Normal left PCA. The origins of the superior cerebellar arteries are patent. The posterior communicating arteries are not visualized. Vertebral arteries widely patent to the vertebrobasilar junction without stenosis. Posterior inferior cerebral arteries patent bilaterally. Basilar patent to its distal aspect. Venous sinuses: As permitted by contrast timing, patent. Anatomic variants: None significant Review of the MIP images confirms the above findings IMPRESSION: 1. 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 2. No intracranial large vessel occlusion. 3. No hemodynamically significant stenosis in the neck. These results were called by telephone at the time of interpretation on 11/17/2020 at 12:02 am to provider [facility] who verbally acknowledged these results. Electronically Signed   By: Merilyn Baba M.D.   On: 11/17/2020 00:04   DG Chest 2 View  Result Date: 11/27/2020 CLINICAL DATA:  Cough, fever EXAM: CHEST - 2 VIEW COMPARISON:  03/25/2020 FINDINGS: The heart size and mediastinal contours are within normal limits. Both lungs are clear. Disc degenerative disease of the thoracic spine. IMPRESSION: No acute abnormality of the lungs. Electronically Signed   By: Delanna Ahmadi M.D.   On: 11/27/2020 09:43   CT Angio Neck W and/or Wo Contrast  Result Date:  12/04/2020 CLINICAL DATA:  Neuro deficit, acute, stroke suspected recent stenting of aneurysm; Neuro deficit, acute, stroke suspected. Left-sided weakness with tingling in the arm and leg. Visual deficit with blurry vision following treatment of an aneurysm last month. EXAM: CT ANGIOGRAPHY HEAD AND NECK TECHNIQUE: Multidetector CT imaging of the head and neck was performed using the standard protocol during bolus administration of intravenous contrast. Multiplanar CT image reconstructions and MIPs were obtained to evaluate the vascular anatomy. Carotid stenosis measurements (when applicable) are obtained utilizing NASCET criteria, using the distal internal carotid diameter as the denominator. CONTRAST:  26mL OMNIPAQUE IOHEXOL 350 MG/ML SOLN COMPARISON:  CTA head and neck 11/16/2020.  Head MRA 12/02/2020. 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 Widely patent arch vessel origins. Right carotid system: Patent without evidence of stenosis, dissection, or significant atherosclerosis. Left carotid system: Patent with minimal  atherosclerotic plaque at the carotid bifurcation. No evidence of stenosis or dissection. Vertebral arteries: Patent without evidence of stenosis, dissection, or significant atherosclerosis. Mildly dominant left vertebral artery. Skeleton: Moderate mid to lower cervical disc degeneration. Asymmetrically advanced left facet arthrosis at C2-3. Other neck: No evidence of cervical lymphadenopathy or mass. Upper chest: No apical lung consolidation or mass. Review of the MIP images confirms the above findings CTA HEAD FINDINGS Anterior circulation: The internal carotid arteries are widely patent from skull base to carotid termini. ACAs and MCAs are patent without evidence of a proximal branch occlusion or flow limiting proximal stenosis although streak artifact obscures  portions of the right A1 and proximal A2 segments. No aneurysm is identified. Posterior circulation: The intracranial vertebral arteries are widely patent to the basilar. Patent PICA and SCA origins are seen bilaterally. The basilar artery is widely patent with a stent noted in the distal basilar artery extending into the right PCA. There has been coiling of a basilar tip aneurysm with prominent associated streak artifact. No residual aneurysm filling is identified. Both PCAs are grossly patent although the P1 segments are poorly evaluated due to streak artifact. Venous sinuses: Patent. Anatomic variants: None. Review of the MIP images confirms the above findings IMPRESSION: 1. No large vessel occlusion. 2. Basilar tip aneurysm coiling without evidence of recurrent aneurysm filling. 3. Suboptimal assessment of the proximal PCAs and right ACA due to streak artifact. 4. Widely patent cervical carotid and vertebral arteries. Electronically Signed   By: Logan Bores M.D.   On: 12/04/2020 16:53   MR ANGIO HEAD WO CONTRAST  Result Date: 12/02/2020 CLINICAL DATA:  Cerebral aneurysm EXAM: MRI HEAD WITHOUT CONTRAST MRA HEAD WITHOUT CONTRAST TECHNIQUE: Multiplanar, multi-echo pulse sequences of the brain and surrounding structures were acquired without intravenous contrast. Angiographic images of the Circle of Willis were acquired using MRA technique without intravenous contrast. COMPARISON:  No pertinent prior exam. FINDINGS: MRI HEAD FINDINGS Brain: Small foci of abnormal diffusion restriction in the right thalamus and right occipital lobe. No acute or chronic hemorrhage. Normal white matter signal, parenchymal volume and CSF spaces. The midline structures are normal. Vascular: Major flow voids are preserved. Skull and upper cervical spine: Normal calvarium and skull base. Visualized upper cervical spine and soft tissues are normal. Sinuses/Orbits:No paranasal sinus fluid levels or advanced mucosal thickening. No  mastoid or middle ear effusion. Normal orbits. MRA HEAD FINDINGS POSTERIOR CIRCULATION: --Vertebral arteries: Normal --Inferior cerebellar arteries: Normal. --Basilar artery: Coil mass near the tip of the basilar artery. There is no visible residual aneurysm filling. --Superior cerebellar arteries: Normal. --Posterior cerebral arteries: The right P1 segment is poorly visualized due to susceptibility effects from the basilar coil mass. Left PCA is normal. The distal right PCA is patent. ANTERIOR CIRCULATION: --Intracranial internal carotid arteries: Normal. --Anterior cerebral arteries (ACA): Normal. --Middle cerebral arteries (MCA): Normal. ANATOMIC VARIANTS: None IMPRESSION: 1. Small foci of acute ischemia in the right thalamus and right occipital lobe, both within the right PCA territory. 2. Coil mass near the tip of the basilar artery. No residual aneurysm filling identified. Electronically Signed   By: Ulyses Jarred M.D.   On: 12/02/2020 22:57   MR BRAIN WO CONTRAST  Result Date: 12/02/2020 CLINICAL DATA:  Cerebral aneurysm EXAM: MRI HEAD WITHOUT CONTRAST MRA HEAD WITHOUT CONTRAST TECHNIQUE: Multiplanar, multi-echo pulse sequences of the brain and surrounding structures were acquired without intravenous contrast. Angiographic images of the Circle of Willis were acquired using MRA technique without intravenous contrast. COMPARISON:  No  pertinent prior exam. FINDINGS: MRI HEAD FINDINGS Brain: Small foci of abnormal diffusion restriction in the right thalamus and right occipital lobe. No acute or chronic hemorrhage. Normal white matter signal, parenchymal volume and CSF spaces. The midline structures are normal. Vascular: Major flow voids are preserved. Skull and upper cervical spine: Normal calvarium and skull base. Visualized upper cervical spine and soft tissues are normal. Sinuses/Orbits:No paranasal sinus fluid levels or advanced mucosal thickening. No mastoid or middle ear effusion. Normal orbits. MRA  HEAD FINDINGS POSTERIOR CIRCULATION: --Vertebral arteries: Normal --Inferior cerebellar arteries: Normal. --Basilar artery: Coil mass near the tip of the basilar artery. There is no visible residual aneurysm filling. --Superior cerebellar arteries: Normal. --Posterior cerebral arteries: The right P1 segment is poorly visualized due to susceptibility effects from the basilar coil mass. Left PCA is normal. The distal right PCA is patent. ANTERIOR CIRCULATION: --Intracranial internal carotid arteries: Normal. --Anterior cerebral arteries (ACA): Normal. --Middle cerebral arteries (MCA): Normal. ANATOMIC VARIANTS: None IMPRESSION: 1. Small foci of acute ischemia in the right thalamus and right occipital lobe, both within the right PCA territory. 2. Coil mass near the tip of the basilar artery. No residual aneurysm filling identified. Electronically Signed   By: Ulyses Jarred M.D.   On: 12/02/2020 22:57   IR Transcath/Emboliz  Result Date: 11/19/2020 PROCEDURE: DIAGNOSTIC CEREBRAL ANGIOGRAM COIL EMBOLIZATION OF BASILAR ANEURYSM HISTORY: The patient is a 60 year old woman presenting to the emergency department with several episodes of diplopia and approximally 1 week of significantly worsening headache. 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 She was therefore admitted to the hospital. After review of CTA it was felt that she would likely be a candidate for stent supported coiling. It was felt that her aneurysm was likely the cause of her new symptoms likely due to enlargement and irritation of the oculomotor nerve. Patient was loaded with 300 mg of Plavix and 325 mg of aspirin. ACCESS: The technical aspects of the procedure as well as its potential risks and benefits were reviewed with the patient and patient's family. These risks included but were not limited to stroke, intracranial hemorrhage, bleeding, infection,  allergic reaction, damage to organs or vital structures, stroke, non-diagnostic procedure, and the catastrophic outcomes of heart attack, coma, and death. With an understanding of these risks, informed consent was obtained and witnessed. The patient was placed in the supine position on the angiography table and the skin of right groin prepped in the usual sterile fashion. The procedure was performed under general anesthesia. A 5-French sheath was introduced in the right common femoral artery using Seldinger technique. MEDICATIONS: HEPARIN: 8000 Units total. INTEGRILIN: 16mg  IA, 61mcg/kg/min IV continuous infusion CONTRAST:  61mL OMNIPAQUE IOHEXOL 300 MG/ML SOLN, 32mL OMNIPAQUE IOHEXOL 300 MG/ML SOLN, 22mL OMNIPAQUE IOHEXOL 300 MG/ML SOLNcc, Omnipaque 300 FLUOROSCOPY TIME:  FLUOROSCOPY TIME: See IR records TECHNIQUE: CATHETERS AND WIRES 5-French JB-1 catheter 180 cm 0.035" glidewire 6-French NeuronMax guide sheath 6-French Berenstein Select JB-1 catheter 0.070 Sofia guide catheter 0.058" CatV guidecatheter Synchro select standard microwire Synchro select soft microwire [employer]ikai 10 microwire Excelsior XT-17 microcatheter x 2 COILS/STENTS USED Target 3D 6 mm x 15 cm Target 360 XL soft 5 mm by 10 cm Target 360 XL 4 mm x 12 cm Target 360 XL 3 mm x 9 cm not deployed Target 360 ultra 2 mm x 4 cm Target 360 ultra 2.5 mm x 4 cm x2 Target 360 ultra 2 mm x  4 cm x2 Neuroform Atlas 3.0 mm x 24 mm VESSELS CATHETERIZED Right internal carotid Left internal carotid Left vertebral Right vertebral Right common femoral Basilar artery Right posterior cerebral artery VESSELS STUDIED Right internal carotid, head Left internal carotid, head Left vertebral Right vertebral Right vertebral artery, head (during embolization) Right vertebral artery, head (post-coiling) Left vertebral artery, head (pre stent) Left vertebral artery, head (immediate post stent) Left vertebral artery, head (during coiling) Left vertebral artery, head (immediate  post coiling) Left vertebral artery, head (post thrombolysis) PROCEDURAL NARRATIVE A 5-Fr JB-1 glide catheter was advanced over a 0.035 glidewire into the aortic arch. The above vessels were then sequentially catheterized and cervical / cerebral angiograms taken. After review of images, the catheter was removed without incident. The 5-Fr sheath was then exchanged over the glidewire for an 8-Fr sheath. Under real-time fluoroscopy, the guide sheath was advanced over the select catheter and glidewire into the descending aorta. 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 The guide sheath was then advanced into the proximal right vertebral artery. The Select catheter was removed and the 070 guide catheter was coaxially introduced over both the XT-17 micro catheters and synchro select micro wires. The microcatheters were then advanced under roadmap guidance into the distal cervical vertebral artery. The guide catheter was then tracked over the microcatheter into the distal vertebral artery. The micro catheters were then further advanced into the proximal basilar artery. This allowed advancement of the guide catheter into the proximal basilar artery. Initially, multiple attempts were made to access the right posterior cerebral artery unsuccessfully. This included a combination of different shape microcatheter tip. I then attempted access with the Chikai 10 microwire again with multiple different tip configurations all unsuccessfully. At this point I elected to attempt to place coils within the aneurysm without the aid of the stent as it did not appear access to the right posterior cerebral artery would be feasible. The three-dimensional coil as well as several filling coils were placed into the aneurysm. There is a small amount of coil loop extending down near the origin of the right posterior cerebral artery. As the last coil  was being placed, some coil loops appear to be extending down further to words the origin of the posterior cerebral arteries and I therefore elected to stop coiling at this point. Angiogram was taken and the micro catheters were withdrawn down into the guide catheter. Guide catheter was then withdrawn down into the cervical vertebral artery and angiogram was taken. This did reveal decreased flow through the right posterior cerebral artery suggesting some compromise of the ostium possibly due to coil mass. I therefore elected to attempt to place a stent by accessing the left vertebral artery. The guide catheter was removed, and the guide sheath was withdrawn down into the descending aorta as was the guide sheath. The select catheter was introduced over the Glidewire, and the left subclavian followed by the proximal left vertebral artery were selected. Guide sheath was then advanced into the proximal left vertebral artery. Under roadmap guidance, the 058 CT 5 guide catheter was introduced over and XT 17 microcatheter and synchro select microwire. These were then navigated until the CAT 5 guide catheter was in the proximal basilar artery. The synchro select microwire was then used to access the right posterior cerebral artery. I was able to gain access to this vessel by buttressing against the coil mass already placed in the aneurysm. Microcatheter  was then tracked into the P2 segment. Microwire was removed, and the above stent was introduced. Stent was then deployed under roadmap guidance without incident. The microcatheter was then advanced over the pushing wire into the lumen of the stent. Angiogram was then taken. It did appear that flow through the right posterior cerebral artery was improved in comparison to the prior angiogram. With the posterior cerebral artery somewhat protected, we elected to attempt to coil more of the residual aneurysm. The microwire was introduced into the microcatheter, and advanced into  the lumen of the aneurysm through the stent. Microcatheter was then advanced into the residual aneurysm. The remainder of the above coils were then sequentially placed. After several coils were placed, I elected to complete the coiling procedure as I did not want to risk coil herniation through the tines of the stent and further compromise the posterior cerebral arteries. The coiling catheter was withdrawn, and the guide catheter was withdrawn down into the cervical vertebral artery. At this point, angiogram was taken which appear to reveal more decreased flow in the right posterior cerebral artery and what appear to be filling defect in the inferior wall of the right posterior cerebral artery adjacent to the coil mass. Therefore under roadmap guidance, I advanced the microcatheter over the microwire back into the lumen of the stent. I then administered a 8 mg bolus dose of Integrilin intra-arterially. After approximately 10 minutes, angiogram was taken which did reveal somewhat increased flow through the right posterior cerebral artery and better perfusion. I then administered another 8 mg bolus dose intra-arterially. After another 10 minutes, another angiogram was taken again revealing improved flow through the right posterior cerebral artery. At this point, the pharmacy was able to deliver intravenous Integrilin which was started at a rate of 2 micrograms/kg/minute with the plan to continue for 18 hours. With improved flow in the right posterior cerebral artery, I elected to complete the procedure at this point. The guide catheter and guide sheath were then synchronously removed without incident. FINDINGS: Right internal carotid, head: Injection reveals the presence of a widely patent ICA, M1, and A1 segments and their branches. No aneurysms, AVMs, or high-flow fistulas are seen. Of note, the posterior communicating artery is very small and no visualization of the posterior circulation is noted. The parenchymal  and venous phases are normal. The venous sinuses are widely patent. Left internal carotid, head: Injection reveals the presence of a widely patent ICA, A1, and M1 segments and their branches. No aneurysms, AVMs, or high-flow fistulas are seen. Similar to the contralateral side, there is no significant visualization of the left posterior communicating artery. The parenchymal and venous phases are normal. The venous sinuses are widely patent. Left vertebral: Injection reveals the presence of a widely patent vertebral artery. This leads to a widely patent basilar artery that terminates in bilateral P1. 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 This aneurysm measures approximately 6.8 x 5.8 mm with a neck measuring approximately 4.5 mm. The parenchymal and venous phases are normal. The venous sinuses are widely patent. Right vertebral: The vertebral artery is widely patent. No PICA aneurysm is seen. See basilar description above. Right vertebral artery, head (during embolization) Angiograms taken during coiling of the basilar aneurysm through the right vertebral artery access demonstrate progressive occlusion primarily of the dome of the aneurysm. The right vertebral artery and bilateral posterior cerebral arteries appear to be widely  patent. There is residual aneurysm both at the neck and centrally within the aneurysm. No filling defects are noted. Right vertebral artery, head (post coiling) Angiogram taken after coiling through the right vertebral artery reveal occlusion of the dome of the aneurysm although there is residual aneurysm centrally and at the neck. There are small coil loops which appear to be in proximity to the origin of the right posterior cerebral artery. No filling defects are seen however flow through the right posterior cerebral artery is delayed in comparison to the left PCA, as well as in  comparison to earlier angiograms suggesting flow limitation. Left vertebral artery, head (pre stent) Angiogram taken through the guide catheter in the left vertebral artery again demonstrates coil mass within the aneurysm with the residual filling as described above. Again seen is delayed filling of the right posterior cerebral artery with decreased perfusion in comparison to prior angiograms. Again, no filling defects are seen to suggest thrombus. Left vertebral artery, head (immediate post stent) Angiogram reveals widely patent left vertebral artery with stent extending from the mid basilar into the right P2 segment. No filling defects are seen. Flow within the right posterior cerebral artery appears to be improved in comparison to the pre stent angiogram with more symmetric filling of the bilateral posterior cerebral arteries. Left vertebral artery, head (during coiling) Angiogram reveals further coil mass within the residual aneurysm which is progressively occluded. No filling defects are noted. The posterior cerebral arteries remain patent. Left vertebral artery, head (immediate post coiling) Angiogram taken from the left vertebral artery reveals patent basilar artery. The left posterior cerebral artery remains widely patent. Stent is in stable position. There is coil mass within the aneurysm, with minimal neck residual. There is decreased flow through the right posterior cerebral artery with filling defect in the inferior wall of the right P1 adjacent to the distal aneurysm neck likely representing thrombus. There is decreased perfusion of the right posterior cerebral artery territory in comparison to the prior angiograms. Left vertebral artery, head (post thrombolysis) Angiograms taken 10 minutes after initial 8 mg bolus dose and taken again 10 minutes after the second 8 mg bolus dose sequentially reveal improvement in flow through the right posterior cerebral artery. There is also much smaller filling defect  along the wall of the posterior cerebral artery adjacent to the coil mass indicating dissolution of some of the thrombus. The final angiogram does show minimally decreased flow through the right posterior cerebral artery with significantly improved perfusion of the right PCA territory in comparison to the previous angiograms. Venous sinuses remain widely patent. DISPOSITION: Upon completion of the study, the femoral sheath was secured in place with nylon suture and connected to a pressure system. The patient was extubated and transferred to the postanesthesia care unit in stable hemodynamic condition. IMPRESSION: 1. 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 Procedure was complicated by stent thrombosis treated with intra arterial and intravenous Integrilin. The preliminary results of this procedure were shared with the patient's family. Electronically Signed   By: Consuella Lose   On: 11/19/2020 20:28   IR Angiogram Follow Up Study  Result Date: 11/19/2020 PROCEDURE: DIAGNOSTIC CEREBRAL ANGIOGRAM COIL EMBOLIZATION OF BASILAR ANEURYSM HISTORY: The patient is a 60 year old woman presenting to the emergency department with several episodes of diplopia and approximally 1 week of significantly worsening headache. 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  She was therefore admitted to the hospital. After review of CTA it was felt that she would likely be a candidate for stent supported coiling. It was felt that her aneurysm was likely the cause of her new symptoms likely due to enlargement and irritation of the oculomotor nerve. Patient was loaded with 300 mg of Plavix and 325 mg of aspirin. ACCESS: The technical aspects of the procedure as well as its potential risks and benefits were reviewed with the patient and patient's family. These risks  included but were not limited to stroke, intracranial hemorrhage, bleeding, infection, allergic reaction, damage to organs or vital structures, stroke, non-diagnostic procedure, and the catastrophic outcomes of heart attack, coma, and death. With an understanding of these risks, informed consent was obtained and witnessed. The patient was placed in the supine position on the angiography table and the skin of right groin prepped in the usual sterile fashion. The procedure was performed under general anesthesia. A 5-French sheath was introduced in the right common femoral artery using Seldinger technique. MEDICATIONS: HEPARIN: 8000 Units total. INTEGRILIN: 16mg  IA, 73mcg/kg/min IV continuous infusion CONTRAST:  4mL OMNIPAQUE IOHEXOL 300 MG/ML SOLN, 59mL OMNIPAQUE IOHEXOL 300 MG/ML SOLN, 88mL OMNIPAQUE IOHEXOL 300 MG/ML SOLNcc, Omnipaque 300 FLUOROSCOPY TIME:  FLUOROSCOPY TIME: See IR records TECHNIQUE: CATHETERS AND WIRES 5-French JB-1 catheter 180 cm 0.035" glidewire 6-French NeuronMax guide sheath 6-French Berenstein Select JB-1 catheter 0.070 Sofia guide catheter 0.058" CatV guidecatheter Synchro select standard microwire Synchro select soft microwire [employer] Chikai 10 microwire Excelsior XT-17 microcatheter x 2 COILS/STENTS USED Target 3D 6 mm x 15 cm Target 360 XL soft 5 mm by 10 cm Target 360 XL 4 mm x 12 cm Target 360 XL 3 mm x 9 cm not deployed Target 360 ultra 2 mm x 4 cm Target 360 ultra 2.5 mm x 4 cm x2 Target 360 ultra 2 mm x 4 cm x2 Neuroform Atlas 3.0 mm x 24 mm VESSELS CATHETERIZED Right internal carotid Left internal carotid Left vertebral Right vertebral Right common femoral Basilar artery Right posterior cerebral artery VESSELS STUDIED Right internal carotid, head Left internal carotid, head Left vertebral Right vertebral Right vertebral artery, head (during embolization) Right vertebral artery, head (post-coiling) Left vertebral artery, head (pre stent) Left vertebral artery, head (immediate post  stent) Left vertebral artery, head (during coiling) Left vertebral artery, head (immediate post coiling) Left vertebral artery, head (post thrombolysis) PROCEDURAL NARRATIVE A 5-Fr JB-1 glide catheter was advanced over a 0.035 glidewire into the aortic arch. The above vessels were then sequentially catheterized and cervical / cerebral angiograms taken. After review of images, the catheter was removed without incident. The 5-Fr sheath was then exchanged over the glidewire for an 8-Fr sheath. Under real-time fluoroscopy, the guide sheath was advanced over the select catheter and glidewire into the descending aorta. 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 The guide sheath was then advanced into the proximal right vertebral artery. The Select catheter was removed and the 070 guide catheter was coaxially introduced over both the XT-17 micro catheters and synchro select micro wires. The microcatheters were then advanced under roadmap guidance into the distal cervical vertebral artery. The guide catheter was then tracked over the microcatheter into the distal vertebral artery. The micro catheters were then further advanced into the proximal basilar artery. This allowed advancement of the guide catheter into the proximal basilar artery. Initially, multiple attempts were made to access the right posterior  cerebral artery unsuccessfully. This included a combination of different shape microcatheter tip. I then attempted access with the Chikai 10 microwire again with multiple different tip configurations all unsuccessfully. At this point I elected to attempt to place coils within the aneurysm without the aid of the stent as it did not appear access to the right posterior cerebral artery would be feasible. The three-dimensional coil as well as several filling coils were placed into the aneurysm. There is a small amount of coil  loop extending down near the origin of the right posterior cerebral artery. As the last coil was being placed, some coil loops appear to be extending down further to words the origin of the posterior cerebral arteries and I therefore elected to stop coiling at this point. Angiogram was taken and the micro catheters were withdrawn down into the guide catheter. Guide catheter was then withdrawn down into the cervical vertebral artery and angiogram was taken. This did reveal decreased flow through the right posterior cerebral artery suggesting some compromise of the ostium possibly due to coil mass. I therefore elected to attempt to place a stent by accessing the left vertebral artery. The guide catheter was removed, and the guide sheath was withdrawn down into the descending aorta as was the guide sheath. The select catheter was introduced over the Glidewire, and the left subclavian followed by the proximal left vertebral artery were selected. Guide sheath was then advanced into the proximal left vertebral artery. Under roadmap guidance, the 058 CT 5 guide catheter was introduced over and XT 17 microcatheter and synchro select microwire. These were then navigated until the CAT 5 guide catheter was in the proximal basilar artery. The synchro select microwire was then used to access the right posterior cerebral artery. I was able to gain access to this vessel by buttressing against the coil mass already placed in the aneurysm. Microcatheter was then tracked into the P2 segment. Microwire was removed, and the above stent was introduced. Stent was then deployed under roadmap guidance without incident. The microcatheter was then advanced over the pushing wire into the lumen of the stent. Angiogram was then taken. It did appear that flow through the right posterior cerebral artery was improved in comparison to the prior angiogram. With the posterior cerebral artery somewhat protected, we elected to attempt to coil more of  the residual aneurysm. The microwire was introduced into the microcatheter, and advanced into the lumen of the aneurysm through the stent. Microcatheter was then advanced into the residual aneurysm. The remainder of the above coils were then sequentially placed. After several coils were placed, I elected to complete the coiling procedure as I did not want to risk coil herniation through the tines of the stent and further compromise the posterior cerebral arteries. The coiling catheter was withdrawn, and the guide catheter was withdrawn down into the cervical vertebral artery. At this point, angiogram was taken which appear to reveal more decreased flow in the right posterior cerebral artery and what appear to be filling defect in the inferior wall of the right posterior cerebral artery adjacent to the coil mass. Therefore under roadmap guidance, I advanced the microcatheter over the microwire back into the lumen of the stent. I then administered a 8 mg bolus dose of Integrilin intra-arterially. After approximately 10 minutes, angiogram was taken which did reveal somewhat increased flow through the right posterior cerebral artery and better perfusion. I then administered another 8 mg bolus dose intra-arterially. After another 10 minutes, another angiogram was taken  again revealing improved flow through the right posterior cerebral artery. At this point, the pharmacy was able to deliver intravenous Integrilin which was started at a rate of 2 micrograms/kg/minute with the plan to continue for 18 hours. With improved flow in the right posterior cerebral artery, I elected to complete the procedure at this point. The guide catheter and guide sheath were then synchronously removed without incident. FINDINGS: Right internal carotid, head: Injection reveals the presence of a widely patent ICA, M1, and A1 segments and their branches. No aneurysms, AVMs, or high-flow fistulas are seen. Of note, the posterior communicating  artery is very small and no visualization of the posterior circulation is noted. The parenchymal and venous phases are normal. The venous sinuses are widely patent. Left internal carotid, head: Injection reveals the presence of a widely patent ICA, A1, and M1 segments and their branches. No aneurysms, AVMs, or high-flow fistulas are seen. Similar to the contralateral side, there is no significant visualization of the left posterior communicating artery. The parenchymal and venous phases are normal. The venous sinuses are widely patent. Left vertebral: Injection reveals the presence of a widely patent vertebral artery. This leads to a widely patent basilar artery that terminates in bilateral P1. 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 This aneurysm measures approximately 6.8 x 5.8 mm with a neck measuring approximately 4.5 mm. The parenchymal and venous phases are normal. The venous sinuses are widely patent. Right vertebral: The vertebral artery is widely patent. No PICA aneurysm is seen. See basilar description above. Right vertebral artery, head (during embolization) Angiograms taken during coiling of the basilar aneurysm through the right vertebral artery access demonstrate progressive occlusion primarily of the dome of the aneurysm. The right vertebral artery and bilateral posterior cerebral arteries appear to be widely patent. There is residual aneurysm both at the neck and centrally within the aneurysm. No filling defects are noted. Right vertebral artery, head (post coiling) Angiogram taken after coiling through the right vertebral artery reveal occlusion of the dome of the aneurysm although there is residual aneurysm centrally and at the neck. There are small coil loops which appear to be in proximity to the origin of the right posterior cerebral artery. No filling defects are seen however flow  through the right posterior cerebral artery is delayed in comparison to the left PCA, as well as in comparison to earlier angiograms suggesting flow limitation. Left vertebral artery, head (pre stent) Angiogram taken through the guide catheter in the left vertebral artery again demonstrates coil mass within the aneurysm with the residual filling as described above. Again seen is delayed filling of the right posterior cerebral artery with decreased perfusion in comparison to prior angiograms. Again, no filling defects are seen to suggest thrombus. Left vertebral artery, head (immediate post stent) Angiogram reveals widely patent left vertebral artery with stent extending from the mid basilar into the right P2 segment. No filling defects are seen. Flow within the right posterior cerebral artery appears to be improved in comparison to the pre stent angiogram with more symmetric filling of the bilateral posterior cerebral arteries. Left vertebral artery, head (during coiling) Angiogram reveals further coil mass within the residual aneurysm which is progressively occluded. No filling defects are noted. The posterior cerebral arteries remain patent. Left vertebral artery, head (immediate post coiling) Angiogram taken from the left vertebral artery reveals patent basilar artery. The left posterior cerebral artery remains widely  patent. Stent is in stable position. There is coil mass within the aneurysm, with minimal neck residual. There is decreased flow through the right posterior cerebral artery with filling defect in the inferior wall of the right P1 adjacent to the distal aneurysm neck likely representing thrombus. There is decreased perfusion of the right posterior cerebral artery territory in comparison to the prior angiograms. Left vertebral artery, head (post thrombolysis) Angiograms taken 10 minutes after initial 8 mg bolus dose and taken again 10 minutes after the second 8 mg bolus dose sequentially reveal  improvement in flow through the right posterior cerebral artery. There is also much smaller filling defect along the wall of the posterior cerebral artery adjacent to the coil mass indicating dissolution of some of the thrombus. The final angiogram does show minimally decreased flow through the right posterior cerebral artery with significantly improved perfusion of the right PCA territory in comparison to the previous angiograms. Venous sinuses remain widely patent. DISPOSITION: Upon completion of the study, the femoral sheath was secured in place with nylon suture and connected to a pressure system. The patient was extubated and transferred to the postanesthesia care unit in stable hemodynamic condition. IMPRESSION: 1. 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 Procedure was complicated by stent thrombosis treated with intra arterial and intravenous Integrilin. The preliminary results of this procedure were shared with the patient's family. Electronically Signed   By: Consuella Lose   On: 11/19/2020 20:28   IR Angiogram Follow Up Study  Result Date: 11/19/2020 PROCEDURE: DIAGNOSTIC CEREBRAL ANGIOGRAM COIL EMBOLIZATION OF BASILAR ANEURYSM HISTORY: The patient is a 60 year old woman presenting to the emergency department with several episodes of diplopia and approximally 1 week of significantly worsening headache. 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 She was therefore admitted to the hospital. After review of CTA it was felt that she would likely be a candidate for stent supported coiling. It was felt that her aneurysm was likely the cause of her new symptoms likely due to enlargement and irritation of the oculomotor nerve. Patient was loaded with 300 mg of Plavix and 325 mg of aspirin. ACCESS: The technical aspects of the procedure as  well as its potential risks and benefits were reviewed with the patient and patient's family. These risks included but were not limited to stroke, intracranial hemorrhage, bleeding, infection, allergic reaction, damage to organs or vital structures, stroke, non-diagnostic procedure, and the catastrophic outcomes of heart attack, coma, and death. With an understanding of these risks, informed consent was obtained and witnessed. The patient was placed in the supine position on the angiography table and the skin of right groin prepped in the usual sterile fashion. The procedure was performed under general anesthesia. A 5-French sheath was introduced in the right common femoral artery using Seldinger technique. MEDICATIONS: HEPARIN: 8000 Units total. INTEGRILIN: 16mg  IA, 38mcg/kg/min IV continuous infusion CONTRAST:  79mL OMNIPAQUE IOHEXOL 300 MG/ML SOLN, 47mL OMNIPAQUE IOHEXOL 300 MG/ML SOLN, 41mL OMNIPAQUE IOHEXOL 300 MG/ML SOLNcc, Omnipaque 300 FLUOROSCOPY TIME:  FLUOROSCOPY TIME: See IR records TECHNIQUE: CATHETERS AND WIRES 5-French JB-1 catheter 180 cm 0.035" glidewire 6-French NeuronMax guide sheath 6-French Berenstein Select JB-1 catheter 0.070 Sofia guide catheter 0.058" CatV guidecatheter Synchro select standard microwire Synchro select soft microwire Asahi Chikai 10 microwire Excelsior XT-17 microcatheter x 2 COILS/STENTS USED Target 3D 6 mm x 15 cm Target 360 XL soft 5 mm  by 10 cm Target 360 XL 4 mm x 12 cm Target 360 XL 3 mm x 9 cm not deployed Target 360 ultra 2 mm x 4 cm Target 360 ultra 2.5 mm x 4 cm x2 Target 360 ultra 2 mm x 4 cm x2 Neuroform Atlas 3.0 mm x 24 mm VESSELS CATHETERIZED Right internal carotid Left internal carotid Left vertebral Right vertebral Right common femoral Basilar artery Right posterior cerebral artery VESSELS STUDIED Right internal carotid, head Left internal carotid, head Left vertebral Right vertebral Right vertebral artery, head (during embolization) Right vertebral artery,  head (post-coiling) Left vertebral artery, head (pre stent) Left vertebral artery, head (immediate post stent) Left vertebral artery, head (during coiling) Left vertebral artery, head (immediate post coiling) Left vertebral artery, head (post thrombolysis) PROCEDURAL NARRATIVE A 5-Fr JB-1 glide catheter was advanced over a 0.035 glidewire into the aortic arch. The above vessels were then sequentially catheterized and cervical / cerebral angiograms taken. After review of images, the catheter was removed without incident. The 5-Fr sheath was then exchanged over the glidewire for an 8-Fr sheath. Under real-time fluoroscopy, the guide sheath was advanced over the select catheter and glidewire into the descending aorta. 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 The guide sheath was then advanced into the proximal right vertebral artery. The Select catheter was removed and the 070 guide catheter was coaxially introduced over both the XT-17 micro catheters and synchro select micro wires. The microcatheters were then advanced under roadmap guidance into the distal cervical vertebral artery. The guide catheter was then tracked over the microcatheter into the distal vertebral artery. The micro catheters were then further advanced into the proximal basilar artery. This allowed advancement of the guide catheter into the proximal basilar artery. Initially, multiple attempts were made to access the right posterior cerebral artery unsuccessfully. This included a combination of different shape microcatheter tip. I then attempted access with the Chikai 10 microwire again with multiple different tip configurations all unsuccessfully. At this point I elected to attempt to place coils within the aneurysm without the aid of the stent as it did not appear access to the right posterior cerebral artery would be feasible. The three-dimensional  coil as well as several filling coils were placed into the aneurysm. There is a small amount of coil loop extending down near the origin of the right posterior cerebral artery. As the last coil was being placed, some coil loops appear to be extending down further to words the origin of the posterior cerebral arteries and I therefore elected to stop coiling at this point. Angiogram was taken and the micro catheters were withdrawn down into the guide catheter. Guide catheter was then withdrawn down into the cervical vertebral artery and angiogram was taken. This did reveal decreased flow through the right posterior cerebral artery suggesting some compromise of the ostium possibly due to coil mass. I therefore elected to attempt to place a stent by accessing the left vertebral artery. The guide catheter was removed, and the guide sheath was withdrawn down into the descending aorta as was the guide sheath. The select catheter was introduced over the Glidewire, and the left subclavian followed by the proximal left vertebral artery were selected. Guide sheath was then advanced into the proximal left vertebral artery. Under roadmap guidance, the 058 CT 5 guide catheter was introduced over and XT 17 microcatheter and synchro select microwire. These were then navigated until the CAT  5 guide catheter was in the proximal basilar artery. The synchro select microwire was then used to access the right posterior cerebral artery. I was able to gain access to this vessel by buttressing against the coil mass already placed in the aneurysm. Microcatheter was then tracked into the P2 segment. Microwire was removed, and the above stent was introduced. Stent was then deployed under roadmap guidance without incident. The microcatheter was then advanced over the pushing wire into the lumen of the stent. Angiogram was then taken. It did appear that flow through the right posterior cerebral artery was improved in comparison to the prior  angiogram. With the posterior cerebral artery somewhat protected, we elected to attempt to coil more of the residual aneurysm. The microwire was introduced into the microcatheter, and advanced into the lumen of the aneurysm through the stent. Microcatheter was then advanced into the residual aneurysm. The remainder of the above coils were then sequentially placed. After several coils were placed, I elected to complete the coiling procedure as I did not want to risk coil herniation through the tines of the stent and further compromise the posterior cerebral arteries. The coiling catheter was withdrawn, and the guide catheter was withdrawn down into the cervical vertebral artery. At this point, angiogram was taken which appear to reveal more decreased flow in the right posterior cerebral artery and what appear to be filling defect in the inferior wall of the right posterior cerebral artery adjacent to the coil mass. Therefore under roadmap guidance, I advanced the microcatheter over the microwire back into the lumen of the stent. I then administered a 8 mg bolus dose of Integrilin intra-arterially. After approximately 10 minutes, angiogram was taken which did reveal somewhat increased flow through the right posterior cerebral artery and better perfusion. I then administered another 8 mg bolus dose intra-arterially. After another 10 minutes, another angiogram was taken again revealing improved flow through the right posterior cerebral artery. At this point, the pharmacy was able to deliver intravenous Integrilin which was started at a rate of 2 micrograms/kg/minute with the plan to continue for 18 hours. With improved flow in the right posterior cerebral artery, I elected to complete the procedure at this point. The guide catheter and guide sheath were then synchronously removed without incident. FINDINGS: Right internal carotid, head: Injection reveals the presence of a widely patent ICA, M1, and A1 segments and their  branches. No aneurysms, AVMs, or high-flow fistulas are seen. Of note, the posterior communicating artery is very small and no visualization of the posterior circulation is noted. The parenchymal and venous phases are normal. The venous sinuses are widely patent. Left internal carotid, head: Injection reveals the presence of a widely patent ICA, A1, and M1 segments and their branches. No aneurysms, AVMs, or high-flow fistulas are seen. Similar to the contralateral side, there is no significant visualization of the left posterior communicating artery. The parenchymal and venous phases are normal. The venous sinuses are widely patent. Left vertebral: Injection reveals the presence of a widely patent vertebral artery. This leads to a widely patent basilar artery that terminates in bilateral P1. 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 This aneurysm measures approximately 6.8 x 5.8 mm with a neck measuring approximately 4.5 mm. The parenchymal and venous phases are normal. The venous sinuses are widely patent. Right vertebral: The vertebral artery is widely patent. No PICA aneurysm is seen. See basilar description  above. Right vertebral artery, head (during embolization) Angiograms taken during coiling of the basilar aneurysm through the right vertebral artery access demonstrate progressive occlusion primarily of the dome of the aneurysm. The right vertebral artery and bilateral posterior cerebral arteries appear to be widely patent. There is residual aneurysm both at the neck and centrally within the aneurysm. No filling defects are noted. Right vertebral artery, head (post coiling) Angiogram taken after coiling through the right vertebral artery reveal occlusion of the dome of the aneurysm although there is residual aneurysm centrally and at the neck. There are small coil loops which appear to be in proximity  to the origin of the right posterior cerebral artery. No filling defects are seen however flow through the right posterior cerebral artery is delayed in comparison to the left PCA, as well as in comparison to earlier angiograms suggesting flow limitation. Left vertebral artery, head (pre stent) Angiogram taken through the guide catheter in the left vertebral artery again demonstrates coil mass within the aneurysm with the residual filling as described above. Again seen is delayed filling of the right posterior cerebral artery with decreased perfusion in comparison to prior angiograms. Again, no filling defects are seen to suggest thrombus. Left vertebral artery, head (immediate post stent) Angiogram reveals widely patent left vertebral artery with stent extending from the mid basilar into the right P2 segment. No filling defects are seen. Flow within the right posterior cerebral artery appears to be improved in comparison to the pre stent angiogram with more symmetric filling of the bilateral posterior cerebral arteries. Left vertebral artery, head (during coiling) Angiogram reveals further coil mass within the residual aneurysm which is progressively occluded. No filling defects are noted. The posterior cerebral arteries remain patent. Left vertebral artery, head (immediate post coiling) Angiogram taken from the left vertebral artery reveals patent basilar artery. The left posterior cerebral artery remains widely patent. Stent is in stable position. There is coil mass within the aneurysm, with minimal neck residual. There is decreased flow through the right posterior cerebral artery with filling defect in the inferior wall of the right P1 adjacent to the distal aneurysm neck likely representing thrombus. There is decreased perfusion of the right posterior cerebral artery territory in comparison to the prior angiograms. Left vertebral artery, head (post thrombolysis) Angiograms taken 10 minutes after initial 8 mg  bolus dose and taken again 10 minutes after the second 8 mg bolus dose sequentially reveal improvement in flow through the right posterior cerebral artery. There is also much smaller filling defect along the wall of the posterior cerebral artery adjacent to the coil mass indicating dissolution of some of the thrombus. The final angiogram does show minimally decreased flow through the right posterior cerebral artery with significantly improved perfusion of the right PCA territory in comparison to the previous angiograms. Venous sinuses remain widely patent. DISPOSITION: Upon completion of the study, the femoral sheath was secured in place with nylon suture and connected to a pressure system. The patient was extubated and transferred to the postanesthesia care unit in stable hemodynamic condition. IMPRESSION: 1. 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 Procedure was complicated by stent thrombosis treated with intra arterial and intravenous Integrilin. The preliminary results of this procedure were shared with the patient's family. Electronically Signed   By: Consuella Lose   On: 11/19/2020 20:28   IR Angiogram Follow Up Study  Result Date: 11/19/2020 PROCEDURE: DIAGNOSTIC CEREBRAL ANGIOGRAM COIL EMBOLIZATION OF BASILAR ANEURYSM  HISTORY: The patient is a 60 year old woman presenting to the emergency department with several episodes of diplopia and approximally 1 week of significantly worsening headache. 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 She was therefore admitted to the hospital. After review of CTA it was felt that she would likely be a candidate for stent supported coiling. It was felt that her aneurysm was likely the cause of her new symptoms likely due to enlargement and irritation of the oculomotor nerve. Patient was loaded with  300 mg of Plavix and 325 mg of aspirin. ACCESS: The technical aspects of the procedure as well as its potential risks and benefits were reviewed with the patient and patient's family. These risks included but were not limited to stroke, intracranial hemorrhage, bleeding, infection, allergic reaction, damage to organs or vital structures, stroke, non-diagnostic procedure, and the catastrophic outcomes of heart attack, coma, and death. With an understanding of these risks, informed consent was obtained and witnessed. The patient was placed in the supine position on the angiography table and the skin of right groin prepped in the usual sterile fashion. The procedure was performed under general anesthesia. A 5-French sheath was introduced in the right common femoral artery using Seldinger technique. MEDICATIONS: HEPARIN: 8000 Units total. INTEGRILIN: 16mg  IA, 40mcg/kg/min IV continuous infusion CONTRAST:  37mL OMNIPAQUE IOHEXOL 300 MG/ML SOLN, 52mL OMNIPAQUE IOHEXOL 300 MG/ML SOLN, 8mL OMNIPAQUE IOHEXOL 300 MG/ML SOLNcc, Omnipaque 300 FLUOROSCOPY TIME:  FLUOROSCOPY TIME: See IR records TECHNIQUE: CATHETERS AND WIRES 5-French JB-1 catheter 180 cm 0.035" glidewire 6-French NeuronMax guide sheath 6-French Berenstein Select JB-1 catheter 0.070 Sofia guide catheter 0.058" CatV guidecatheter Synchro select standard microwire Synchro select soft microwire Asahi Chikai 10 microwire Excelsior XT-17 microcatheter x 2 COILS/STENTS USED Target 3D 6 mm x 15 cm Target 360 XL soft 5 mm by 10 cm Target 360 XL 4 mm x 12 cm Target 360 XL 3 mm x 9 cm not deployed Target 360 ultra 2 mm x 4 cm Target 360 ultra 2.5 mm x 4 cm x2 Target 360 ultra 2 mm x 4 cm x2 Neuroform Atlas 3.0 mm x 24 mm VESSELS CATHETERIZED Right internal carotid Left internal carotid Left vertebral Right vertebral Right common femoral Basilar artery Right posterior cerebral artery VESSELS STUDIED Right internal carotid, head Left internal carotid, head Left vertebral  Right vertebral Right vertebral artery, head (during embolization) Right vertebral artery, head (post-coiling) Left vertebral artery, head (pre stent) Left vertebral artery, head (immediate post stent) Left vertebral artery, head (during coiling) Left vertebral artery, head (immediate post coiling) Left vertebral artery, head (post thrombolysis) PROCEDURAL NARRATIVE A 5-Fr JB-1 glide catheter was advanced over a 0.035 glidewire into the aortic arch. The above vessels were then sequentially catheterized and cervical / cerebral angiograms taken. After review of images, the catheter was removed without incident. The 5-Fr sheath was then exchanged over the glidewire for an 8-Fr sheath. Under real-time fluoroscopy, the guide sheath was advanced over the select catheter and glidewire into the descending aorta. 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 The guide sheath was then advanced into the proximal right vertebral artery. The Select catheter was removed and the 070 guide catheter was coaxially introduced over both the XT-17 micro catheters and synchro select micro wires. The microcatheters were then advanced under roadmap guidance into the distal cervical vertebral artery. The guide catheter was  then tracked over the microcatheter into the distal vertebral artery. The micro catheters were then further advanced into the proximal basilar artery. This allowed advancement of the guide catheter into the proximal basilar artery. Initially, multiple attempts were made to access the right posterior cerebral artery unsuccessfully. This included a combination of different shape microcatheter tip. I then attempted access with the Chikai 10 microwire again with multiple different tip configurations all unsuccessfully. At this point I elected to attempt to place coils within the aneurysm without the aid of the stent as it did not  appear access to the right posterior cerebral artery would be feasible. The three-dimensional coil as well as several filling coils were placed into the aneurysm. There is a small amount of coil loop extending down near the origin of the right posterior cerebral artery. As the last coil was being placed, some coil loops appear to be extending down further to words the origin of the posterior cerebral arteries and I therefore elected to stop coiling at this point. Angiogram was taken and the micro catheters were withdrawn down into the guide catheter. Guide catheter was then withdrawn down into the cervical vertebral artery and angiogram was taken. This did reveal decreased flow through the right posterior cerebral artery suggesting some compromise of the ostium possibly due to coil mass. I therefore elected to attempt to place a stent by accessing the left vertebral artery. The guide catheter was removed, and the guide sheath was withdrawn down into the descending aorta as was the guide sheath. The select catheter was introduced over the Glidewire, and the left subclavian followed by the proximal left vertebral artery were selected. Guide sheath was then advanced into the proximal left vertebral artery. Under roadmap guidance, the 058 CT 5 guide catheter was introduced over and XT 17 microcatheter and synchro select microwire. These were then navigated until the CAT 5 guide catheter was in the proximal basilar artery. The synchro select microwire was then used to access the right posterior cerebral artery. I was able to gain access to this vessel by buttressing against the coil mass already placed in the aneurysm. Microcatheter was then tracked into the P2 segment. Microwire was removed, and the above stent was introduced. Stent was then deployed under roadmap guidance without incident. The microcatheter was then advanced over the pushing wire into the lumen of the stent. Angiogram was then taken. It did appear that  flow through the right posterior cerebral artery was improved in comparison to the prior angiogram. With the posterior cerebral artery somewhat protected, we elected to attempt to coil more of the residual aneurysm. The microwire was introduced into the microcatheter, and advanced into the lumen of the aneurysm through the stent. Microcatheter was then advanced into the residual aneurysm. The remainder of the above coils were then sequentially placed. After several coils were placed, I elected to complete the coiling procedure as I did not want to risk coil herniation through the tines of the stent and further compromise the posterior cerebral arteries. The coiling catheter was withdrawn, and the guide catheter was withdrawn down into the cervical vertebral artery. At this point, angiogram was taken which appear to reveal more decreased flow in the right posterior cerebral artery and what appear to be filling defect in the inferior wall of the right posterior cerebral artery adjacent to the coil mass. Therefore under roadmap guidance, I advanced the microcatheter over the microwire back into the lumen of the stent. I then administered a 8 mg  bolus dose of Integrilin intra-arterially. After approximately 10 minutes, angiogram was taken which did reveal somewhat increased flow through the right posterior cerebral artery and better perfusion. I then administered another 8 mg bolus dose intra-arterially. After another 10 minutes, another angiogram was taken again revealing improved flow through the right posterior cerebral artery. At this point, the pharmacy was able to deliver intravenous Integrilin which was started at a rate of 2 micrograms/kg/minute with the plan to continue for 18 hours. With improved flow in the right posterior cerebral artery, I elected to complete the procedure at this point. The guide catheter and guide sheath were then synchronously removed without incident. FINDINGS: Right internal carotid,  head: Injection reveals the presence of a widely patent ICA, M1, and A1 segments and their branches. No aneurysms, AVMs, or high-flow fistulas are seen. Of note, the posterior communicating artery is very small and no visualization of the posterior circulation is noted. The parenchymal and venous phases are normal. The venous sinuses are widely patent. Left internal carotid, head: Injection reveals the presence of a widely patent ICA, A1, and M1 segments and their branches. No aneurysms, AVMs, or high-flow fistulas are seen. Similar to the contralateral side, there is no significant visualization of the left posterior communicating artery. The parenchymal and venous phases are normal. The venous sinuses are widely patent. Left vertebral: Injection reveals the presence of a widely patent vertebral artery. This leads to a widely patent basilar artery that terminates in bilateral P1. 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 This aneurysm measures approximately 6.8 x 5.8 mm with a neck measuring approximately 4.5 mm. The parenchymal and venous phases are normal. The venous sinuses are widely patent. Right vertebral: The vertebral artery is widely patent. No PICA aneurysm is seen. See basilar description above. Right vertebral artery, head (during embolization) Angiograms taken during coiling of the basilar aneurysm through the right vertebral artery access demonstrate progressive occlusion primarily of the dome of the aneurysm. The right vertebral artery and bilateral posterior cerebral arteries appear to be widely patent. There is residual aneurysm both at the neck and centrally within the aneurysm. No filling defects are noted. Right vertebral artery, head (post coiling) Angiogram taken after coiling through the right vertebral artery reveal occlusion of the dome of the aneurysm although there is residual  aneurysm centrally and at the neck. There are small coil loops which appear to be in proximity to the origin of the right posterior cerebral artery. No filling defects are seen however flow through the right posterior cerebral artery is delayed in comparison to the left PCA, as well as in comparison to earlier angiograms suggesting flow limitation. Left vertebral artery, head (pre stent) Angiogram taken through the guide catheter in the left vertebral artery again demonstrates coil mass within the aneurysm with the residual filling as described above. Again seen is delayed filling of the right posterior cerebral artery with decreased perfusion in comparison to prior angiograms. Again, no filling defects are seen to suggest thrombus. Left vertebral artery, head (immediate post stent) Angiogram reveals widely patent left vertebral artery with stent extending from the mid basilar into the right P2 segment. No filling defects are seen. Flow within the right posterior cerebral artery appears to be improved in comparison to the pre stent angiogram with more symmetric filling of the bilateral posterior cerebral arteries. Left vertebral artery, head (during coiling) Angiogram reveals further coil mass  within the residual aneurysm which is progressively occluded. No filling defects are noted. The posterior cerebral arteries remain patent. Left vertebral artery, head (immediate post coiling) Angiogram taken from the left vertebral artery reveals patent basilar artery. The left posterior cerebral artery remains widely patent. Stent is in stable position. There is coil mass within the aneurysm, with minimal neck residual. There is decreased flow through the right posterior cerebral artery with filling defect in the inferior wall of the right P1 adjacent to the distal aneurysm neck likely representing thrombus. There is decreased perfusion of the right posterior cerebral artery territory in comparison to the prior angiograms.  Left vertebral artery, head (post thrombolysis) Angiograms taken 10 minutes after initial 8 mg bolus dose and taken again 10 minutes after the second 8 mg bolus dose sequentially reveal improvement in flow through the right posterior cerebral artery. There is also much smaller filling defect along the wall of the posterior cerebral artery adjacent to the coil mass indicating dissolution of some of the thrombus. The final angiogram does show minimally decreased flow through the right posterior cerebral artery with significantly improved perfusion of the right PCA territory in comparison to the previous angiograms. Venous sinuses remain widely patent. DISPOSITION: Upon completion of the study, the femoral sheath was secured in place with nylon suture and connected to a pressure system. The patient was extubated and transferred to the postanesthesia care unit in stable hemodynamic condition. IMPRESSION: 1. 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 Procedure was complicated by stent thrombosis treated with intra arterial and intravenous Integrilin. The preliminary results of this procedure were shared with the patient's family. Electronically Signed   By: Consuella Lose   On: 11/19/2020 20:28   IR Angiogram Follow Up Study  Result Date: 11/19/2020 PROCEDURE: DIAGNOSTIC CEREBRAL ANGIOGRAM COIL EMBOLIZATION OF BASILAR ANEURYSM HISTORY: The patient is a 60 year old woman presenting to the emergency department with several episodes of diplopia and approximally 1 week of significantly worsening headache. 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 She was therefore admitted to the hospital. After review of CTA it was felt that she would likely be a candidate for stent supported coiling. It was felt that her aneurysm was likely the cause of her new  symptoms likely due to enlargement and irritation of the oculomotor nerve. Patient was loaded with 300 mg of Plavix and 325 mg of aspirin. ACCESS: The technical aspects of the procedure as well as its potential risks and benefits were reviewed with the patient and patient's family. These risks included but were not limited to stroke, intracranial hemorrhage, bleeding, infection, allergic reaction, damage to organs or vital structures, stroke, non-diagnostic procedure, and the catastrophic outcomes of heart attack, coma, and death. With an understanding of these risks, informed consent was obtained and witnessed. The patient was placed in the supine position on the angiography table and the skin of right groin prepped in the usual sterile fashion. The procedure was performed under general anesthesia. A 5-French sheath was introduced in the right common femoral artery using Seldinger technique. MEDICATIONS: HEPARIN: 8000 Units total. INTEGRILIN: 16mg  IA, 56mcg/kg/min IV continuous infusion CONTRAST:  71mL OMNIPAQUE IOHEXOL 300 MG/ML SOLN, 63mL OMNIPAQUE IOHEXOL 300 MG/ML SOLN, 25mL OMNIPAQUE IOHEXOL 300 MG/ML SOLNcc, Omnipaque 300 FLUOROSCOPY TIME:  FLUOROSCOPY TIME: See IR records TECHNIQUE: CATHETERS AND WIRES 5-French JB-1 catheter 180 cm 0.035" glidewire 6-French NeuronMax guide sheath  6-French Berenstein Select JB-1 catheter 0.070 Sofia guide catheter 0.058" CatV guidecatheter Synchro select standard microwire Synchro select soft microwire Asahi Chikai 10 microwire Excelsior XT-17 microcatheter x 2 COILS/STENTS USED Target 3D 6 mm x 15 cm Target 360 XL soft 5 mm by 10 cm Target 360 XL 4 mm x 12 cm Target 360 XL 3 mm x 9 cm not deployed Target 360 ultra 2 mm x 4 cm Target 360 ultra 2.5 mm x 4 cm x2 Target 360 ultra 2 mm x 4 cm x2 Neuroform Atlas 3.0 mm x 24 mm VESSELS CATHETERIZED Right internal carotid Left internal carotid Left vertebral Right vertebral Right common femoral Basilar artery Right posterior cerebral  artery VESSELS STUDIED Right internal carotid, head Left internal carotid, head Left vertebral Right vertebral Right vertebral artery, head (during embolization) Right vertebral artery, head (post-coiling) Left vertebral artery, head (pre stent) Left vertebral artery, head (immediate post stent) Left vertebral artery, head (during coiling) Left vertebral artery, head (immediate post coiling) Left vertebral artery, head (post thrombolysis) PROCEDURAL NARRATIVE A 5-Fr JB-1 glide catheter was advanced over a 0.035 glidewire into the aortic arch. The above vessels were then sequentially catheterized and cervical / cerebral angiograms taken. After review of images, the catheter was removed without incident. The 5-Fr sheath was then exchanged over the glidewire for an 8-Fr sheath. Under real-time fluoroscopy, the guide sheath was advanced over the select catheter and glidewire into the descending aorta. 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 The guide sheath was then advanced into the proximal right vertebral artery. The Select catheter was removed and the 070 guide catheter was coaxially introduced over both the XT-17 micro catheters and synchro select micro wires. The microcatheters were then advanced under roadmap guidance into the distal cervical vertebral artery. The guide catheter was then tracked over the microcatheter into the distal vertebral artery. The micro catheters were then further advanced into the proximal basilar artery. This allowed advancement of the guide catheter into the proximal basilar artery. Initially, multiple attempts were made to access the right posterior cerebral artery unsuccessfully. This included a combination of different shape microcatheter tip. I then attempted access with the Chikai 10 microwire again with multiple different tip configurations all unsuccessfully. At this point I  elected to attempt to place coils within the aneurysm without the aid of the stent as it did not appear access to the right posterior cerebral artery would be feasible. The three-dimensional coil as well as several filling coils were placed into the aneurysm. There is a small amount of coil loop extending down near the origin of the right posterior cerebral artery. As the last coil was being placed, some coil loops appear to be extending down further to words the origin of the posterior cerebral arteries and I therefore elected to stop coiling at this point. Angiogram was taken and the micro catheters were withdrawn down into the guide catheter. Guide catheter was then withdrawn down into the cervical vertebral artery and angiogram was taken. This did reveal decreased flow through the right posterior cerebral artery suggesting some compromise of the ostium possibly due to coil mass. I therefore elected to attempt to place a stent by accessing the left vertebral artery. The guide catheter was removed, and the guide sheath was withdrawn down into the descending aorta as was the guide sheath. The select catheter was introduced over the Glidewire, and the left subclavian followed by the  proximal left vertebral artery were selected. Guide sheath was then advanced into the proximal left vertebral artery. Under roadmap guidance, the 058 CT 5 guide catheter was introduced over and XT 17 microcatheter and synchro select microwire. These were then navigated until the CAT 5 guide catheter was in the proximal basilar artery. The synchro select microwire was then used to access the right posterior cerebral artery. I was able to gain access to this vessel by buttressing against the coil mass already placed in the aneurysm. Microcatheter was then tracked into the P2 segment. Microwire was removed, and the above stent was introduced. Stent was then deployed under roadmap guidance without incident. The microcatheter was then  advanced over the pushing wire into the lumen of the stent. Angiogram was then taken. It did appear that flow through the right posterior cerebral artery was improved in comparison to the prior angiogram. With the posterior cerebral artery somewhat protected, we elected to attempt to coil more of the residual aneurysm. The microwire was introduced into the microcatheter, and advanced into the lumen of the aneurysm through the stent. Microcatheter was then advanced into the residual aneurysm. The remainder of the above coils were then sequentially placed. After several coils were placed, I elected to complete the coiling procedure as I did not want to risk coil herniation through the tines of the stent and further compromise the posterior cerebral arteries. The coiling catheter was withdrawn, and the guide catheter was withdrawn down into the cervical vertebral artery. At this point, angiogram was taken which appear to reveal more decreased flow in the right posterior cerebral artery and what appear to be filling defect in the inferior wall of the right posterior cerebral artery adjacent to the coil mass. Therefore under roadmap guidance, I advanced the microcatheter over the microwire back into the lumen of the stent. I then administered a 8 mg bolus dose of Integrilin intra-arterially. After approximately 10 minutes, angiogram was taken which did reveal somewhat increased flow through the right posterior cerebral artery and better perfusion. I then administered another 8 mg bolus dose intra-arterially. After another 10 minutes, another angiogram was taken again revealing improved flow through the right posterior cerebral artery. At this point, the pharmacy was able to deliver intravenous Integrilin which was started at a rate of 2 micrograms/kg/minute with the plan to continue for 18 hours. With improved flow in the right posterior cerebral artery, I elected to complete the procedure at this point. The guide  catheter and guide sheath were then synchronously removed without incident. FINDINGS: Right internal carotid, head: Injection reveals the presence of a widely patent ICA, M1, and A1 segments and their branches. No aneurysms, AVMs, or high-flow fistulas are seen. Of note, the posterior communicating artery is very small and no visualization of the posterior circulation is noted. The parenchymal and venous phases are normal. The venous sinuses are widely patent. Left internal carotid, head: Injection reveals the presence of a widely patent ICA, A1, and M1 segments and their branches. No aneurysms, AVMs, or high-flow fistulas are seen. Similar to the contralateral side, there is no significant visualization of the left posterior communicating artery. The parenchymal and venous phases are normal. The venous sinuses are widely patent. Left vertebral: Injection reveals the presence of a widely patent vertebral artery. This leads to a widely patent basilar artery that terminates in bilateral P1. 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  This aneurysm measures approximately 6.8 x 5.8 mm with a neck measuring approximately 4.5 mm. The parenchymal and venous phases are normal. The venous sinuses are widely patent. Right vertebral: The vertebral artery is widely patent. No PICA aneurysm is seen. See basilar description above. Right vertebral artery, head (during embolization) Angiograms taken during coiling of the basilar aneurysm through the right vertebral artery access demonstrate progressive occlusion primarily of the dome of the aneurysm. The right vertebral artery and bilateral posterior cerebral arteries appear to be widely patent. There is residual aneurysm both at the neck and centrally within the aneurysm. No filling defects are noted. Right vertebral artery, head (post coiling) Angiogram taken after coiling  through the right vertebral artery reveal occlusion of the dome of the aneurysm although there is residual aneurysm centrally and at the neck. There are small coil loops which appear to be in proximity to the origin of the right posterior cerebral artery. No filling defects are seen however flow through the right posterior cerebral artery is delayed in comparison to the left PCA, as well as in comparison to earlier angiograms suggesting flow limitation. Left vertebral artery, head (pre stent) Angiogram taken through the guide catheter in the left vertebral artery again demonstrates coil mass within the aneurysm with the residual filling as described above. Again seen is delayed filling of the right posterior cerebral artery with decreased perfusion in comparison to prior angiograms. Again, no filling defects are seen to suggest thrombus. Left vertebral artery, head (immediate post stent) Angiogram reveals widely patent left vertebral artery with stent extending from the mid basilar into the right P2 segment. No filling defects are seen. Flow within the right posterior cerebral artery appears to be improved in comparison to the pre stent angiogram with more symmetric filling of the bilateral posterior cerebral arteries. Left vertebral artery, head (during coiling) Angiogram reveals further coil mass within the residual aneurysm which is progressively occluded. No filling defects are noted. The posterior cerebral arteries remain patent. Left vertebral artery, head (immediate post coiling) Angiogram taken from the left vertebral artery reveals patent basilar artery. The left posterior cerebral artery remains widely patent. Stent is in stable position. There is coil mass within the aneurysm, with minimal neck residual. There is decreased flow through the right posterior cerebral artery with filling defect in the inferior wall of the right P1 adjacent to the distal aneurysm neck likely representing thrombus. There is  decreased perfusion of the right posterior cerebral artery territory in comparison to the prior angiograms. Left vertebral artery, head (post thrombolysis) Angiograms taken 10 minutes after initial 8 mg bolus dose and taken again 10 minutes after the second 8 mg bolus dose sequentially reveal improvement in flow through the right posterior cerebral artery. There is also much smaller filling defect along the wall of the posterior cerebral artery adjacent to the coil mass indicating dissolution of some of the thrombus. The final angiogram does show minimally decreased flow through the right posterior cerebral artery with significantly improved perfusion of the right PCA territory in comparison to the previous angiograms. Venous sinuses remain widely patent. DISPOSITION: Upon completion of the study, the femoral sheath was secured in place with nylon suture and connected to a pressure system. The patient was extubated and transferred to the postanesthesia care unit in stable hemodynamic condition. IMPRESSION: 1. 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 Procedure was complicated by stent thrombosis treated with intra arterial and intravenous Integrilin. The  preliminary results of this procedure were shared with the patient's family. Electronically Signed   By: Consuella Lose   On: 11/19/2020 20:28   IR Angiogram Follow Up Study  Result Date: 11/19/2020 PROCEDURE: DIAGNOSTIC CEREBRAL ANGIOGRAM COIL EMBOLIZATION OF BASILAR ANEURYSM HISTORY: The patient is a 60 year old woman presenting to the emergency department with several episodes of diplopia and approximally 1 week of significantly worsening headache. 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 She was therefore admitted to the hospital. After review of CTA it was felt that she would likely be a  candidate for stent supported coiling. It was felt that her aneurysm was likely the cause of her new symptoms likely due to enlargement and irritation of the oculomotor nerve. Patient was loaded with 300 mg of Plavix and 325 mg of aspirin. ACCESS: The technical aspects of the procedure as well as its potential risks and benefits were reviewed with the patient and patient's family. These risks included but were not limited to stroke, intracranial hemorrhage, bleeding, infection, allergic reaction, damage to organs or vital structures, stroke, non-diagnostic procedure, and the catastrophic outcomes of heart attack, coma, and death. With an understanding of these risks, informed consent was obtained and witnessed. The patient was placed in the supine position on the angiography table and the skin of right groin prepped in the usual sterile fashion. The procedure was performed under general anesthesia. A 5-French sheath was introduced in the right common femoral artery using Seldinger technique. MEDICATIONS: HEPARIN: 8000 Units total. INTEGRILIN: 16mg  IA, 69mcg/kg/min IV continuous infusion CONTRAST:  16mL OMNIPAQUE IOHEXOL 300 MG/ML SOLN, 67mL OMNIPAQUE IOHEXOL 300 MG/ML SOLN, 43mL OMNIPAQUE IOHEXOL 300 MG/ML SOLNcc, Omnipaque 300 FLUOROSCOPY TIME:  FLUOROSCOPY TIME: See IR records TECHNIQUE: CATHETERS AND WIRES 5-French JB-1 catheter 180 cm 0.035" glidewire 6-French NeuronMax guide sheath 6-French Berenstein Select JB-1 catheter 0.070 Sofia guide catheter 0.058" CatV guidecatheter Synchro select standard microwire Synchro select soft microwire Asahi Chikai 10 microwire Excelsior XT-17 microcatheter x 2 COILS/STENTS USED Target 3D 6 mm x 15 cm Target 360 XL soft 5 mm by 10 cm Target 360 XL 4 mm x 12 cm Target 360 XL 3 mm x 9 cm not deployed Target 360 ultra 2 mm x 4 cm Target 360 ultra 2.5 mm x 4 cm x2 Target 360 ultra 2 mm x 4 cm x2 Neuroform Atlas 3.0 mm x 24 mm VESSELS CATHETERIZED Right internal carotid Left  internal carotid Left vertebral Right vertebral Right common femoral Basilar artery Right posterior cerebral artery VESSELS STUDIED Right internal carotid, head Left internal carotid, head Left vertebral Right vertebral Right vertebral artery, head (during embolization) Right vertebral artery, head (post-coiling) Left vertebral artery, head (pre stent) Left vertebral artery, head (immediate post stent) Left vertebral artery, head (during coiling) Left vertebral artery, head (immediate post coiling) Left vertebral artery, head (post thrombolysis) PROCEDURAL NARRATIVE A 5-Fr JB-1 glide catheter was advanced over a 0.035 glidewire into the aortic arch. The above vessels were then sequentially catheterized and cervical / cerebral angiograms taken. After review of images, the catheter was removed without incident. The 5-Fr sheath was then exchanged over the glidewire for an 8-Fr sheath. Under real-time fluoroscopy, the guide sheath was advanced over the select catheter and glidewire into the descending aorta. 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 The guide sheath was then advanced into the proximal right  vertebral artery. The Select catheter was removed and the 070 guide catheter was coaxially introduced over both the XT-17 micro catheters and synchro select micro wires. The microcatheters were then advanced under roadmap guidance into the distal cervical vertebral artery. The guide catheter was then tracked over the microcatheter into the distal vertebral artery. The micro catheters were then further advanced into the proximal basilar artery. This allowed advancement of the guide catheter into the proximal basilar artery. Initially, multiple attempts were made to access the right posterior cerebral artery unsuccessfully. This included a combination of different shape microcatheter tip. I then attempted access with the  Chikai 10 microwire again with multiple different tip configurations all unsuccessfully. At this point I elected to attempt to place coils within the aneurysm without the aid of the stent as it did not appear access to the right posterior cerebral artery would be feasible. The three-dimensional coil as well as several filling coils were placed into the aneurysm. There is a small amount of coil loop extending down near the origin of the right posterior cerebral artery. As the last coil was being placed, some coil loops appear to be extending down further to words the origin of the posterior cerebral arteries and I therefore elected to stop coiling at this point. Angiogram was taken and the micro catheters were withdrawn down into the guide catheter. Guide catheter was then withdrawn down into the cervical vertebral artery and angiogram was taken. This did reveal decreased flow through the right posterior cerebral artery suggesting some compromise of the ostium possibly due to coil mass. I therefore elected to attempt to place a stent by accessing the left vertebral artery. The guide catheter was removed, and the guide sheath was withdrawn down into the descending aorta as was the guide sheath. The select catheter was introduced over the Glidewire, and the left subclavian followed by the proximal left vertebral artery were selected. Guide sheath was then advanced into the proximal left vertebral artery. Under roadmap guidance, the 058 CT 5 guide catheter was introduced over and XT 17 microcatheter and synchro select microwire. These were then navigated until the CAT 5 guide catheter was in the proximal basilar artery. The synchro select microwire was then used to access the right posterior cerebral artery. I was able to gain access to this vessel by buttressing against the coil mass already placed in the aneurysm. Microcatheter was then tracked into the P2 segment. Microwire was removed, and the above stent was  introduced. Stent was then deployed under roadmap guidance without incident. The microcatheter was then advanced over the pushing wire into the lumen of the stent. Angiogram was then taken. It did appear that flow through the right posterior cerebral artery was improved in comparison to the prior angiogram. With the posterior cerebral artery somewhat protected, we elected to attempt to coil more of the residual aneurysm. The microwire was introduced into the microcatheter, and advanced into the lumen of the aneurysm through the stent. Microcatheter was then advanced into the residual aneurysm. The remainder of the above coils were then sequentially placed. After several coils were placed, I elected to complete the coiling procedure as I did not want to risk coil herniation through the tines of the stent and further compromise the posterior cerebral arteries. The coiling catheter was withdrawn, and the guide catheter was withdrawn down into the cervical vertebral artery. At this point, angiogram was taken which appear to reveal more decreased flow in the right posterior cerebral artery and what  appear to be filling defect in the inferior wall of the right posterior cerebral artery adjacent to the coil mass. Therefore under roadmap guidance, I advanced the microcatheter over the microwire back into the lumen of the stent. I then administered a 8 mg bolus dose of Integrilin intra-arterially. After approximately 10 minutes, angiogram was taken which did reveal somewhat increased flow through the right posterior cerebral artery and better perfusion. I then administered another 8 mg bolus dose intra-arterially. After another 10 minutes, another angiogram was taken again revealing improved flow through the right posterior cerebral artery. At this point, the pharmacy was able to deliver intravenous Integrilin which was started at a rate of 2 micrograms/kg/minute with the plan to continue for 18 hours. With improved flow in  the right posterior cerebral artery, I elected to complete the procedure at this point. The guide catheter and guide sheath were then synchronously removed without incident. FINDINGS: Right internal carotid, head: Injection reveals the presence of a widely patent ICA, M1, and A1 segments and their branches. No aneurysms, AVMs, or high-flow fistulas are seen. Of note, the posterior communicating artery is very small and no visualization of the posterior circulation is noted. The parenchymal and venous phases are normal. The venous sinuses are widely patent. Left internal carotid, head: Injection reveals the presence of a widely patent ICA, A1, and M1 segments and their branches. No aneurysms, AVMs, or high-flow fistulas are seen. Similar to the contralateral side, there is no significant visualization of the left posterior communicating artery. The parenchymal and venous phases are normal. The venous sinuses are widely patent. Left vertebral: Injection reveals the presence of a widely patent vertebral artery. This leads to a widely patent basilar artery that terminates in bilateral P1. 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 This aneurysm measures approximately 6.8 x 5.8 mm with a neck measuring approximately 4.5 mm. The parenchymal and venous phases are normal. The venous sinuses are widely patent. Right vertebral: The vertebral artery is widely patent. No PICA aneurysm is seen. See basilar description above. Right vertebral artery, head (during embolization) Angiograms taken during coiling of the basilar aneurysm through the right vertebral artery access demonstrate progressive occlusion primarily of the dome of the aneurysm. The right vertebral artery and bilateral posterior cerebral arteries appear to be widely patent. There is residual aneurysm both at the neck and centrally within the aneurysm. No  filling defects are noted. Right vertebral artery, head (post coiling) Angiogram taken after coiling through the right vertebral artery reveal occlusion of the dome of the aneurysm although there is residual aneurysm centrally and at the neck. There are small coil loops which appear to be in proximity to the origin of the right posterior cerebral artery. No filling defects are seen however flow through the right posterior cerebral artery is delayed in comparison to the left PCA, as well as in comparison to earlier angiograms suggesting flow limitation. Left vertebral artery, head (pre stent) Angiogram taken through the guide catheter in the left vertebral artery again demonstrates coil mass within the aneurysm with the residual filling as described above. Again seen is delayed filling of the right posterior cerebral artery with decreased perfusion in comparison to prior angiograms. Again, no filling defects are seen to suggest thrombus. Left vertebral artery, head (immediate post stent) Angiogram reveals widely patent left vertebral artery with stent extending from the mid basilar into the right P2 segment.  No filling defects are seen. Flow within the right posterior cerebral artery appears to be improved in comparison to the pre stent angiogram with more symmetric filling of the bilateral posterior cerebral arteries. Left vertebral artery, head (during coiling) Angiogram reveals further coil mass within the residual aneurysm which is progressively occluded. No filling defects are noted. The posterior cerebral arteries remain patent. Left vertebral artery, head (immediate post coiling) Angiogram taken from the left vertebral artery reveals patent basilar artery. The left posterior cerebral artery remains widely patent. Stent is in stable position. There is coil mass within the aneurysm, with minimal neck residual. There is decreased flow through the right posterior cerebral artery with filling defect in the inferior  wall of the right P1 adjacent to the distal aneurysm neck likely representing thrombus. There is decreased perfusion of the right posterior cerebral artery territory in comparison to the prior angiograms. Left vertebral artery, head (post thrombolysis) Angiograms taken 10 minutes after initial 8 mg bolus dose and taken again 10 minutes after the second 8 mg bolus dose sequentially reveal improvement in flow through the right posterior cerebral artery. There is also much smaller filling defect along the wall of the posterior cerebral artery adjacent to the coil mass indicating dissolution of some of the thrombus. The final angiogram does show minimally decreased flow through the right posterior cerebral artery with significantly improved perfusion of the right PCA territory in comparison to the previous angiograms. Venous sinuses remain widely patent. DISPOSITION: Upon completion of the study, the femoral sheath was secured in place with nylon suture and connected to a pressure system. The patient was extubated and transferred to the postanesthesia care unit in stable hemodynamic condition. IMPRESSION: 1. 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 Procedure was complicated by stent thrombosis treated with intra arterial and intravenous Integrilin. The preliminary results of this procedure were shared with the patient's family. Electronically Signed   By: Consuella Lose   On: 11/19/2020 20:28   IR Angiogram Follow Up Study  Result Date: 11/19/2020 PROCEDURE: DIAGNOSTIC CEREBRAL ANGIOGRAM COIL EMBOLIZATION OF BASILAR ANEURYSM HISTORY: The patient is a 60 year old woman presenting to the emergency department with several episodes of diplopia and approximally 1 week of significantly worsening headache. 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 She  was therefore admitted to the hospital. After review of CTA it was felt that she would likely be a candidate for stent supported coiling. It was felt that her aneurysm was likely the cause of her new symptoms likely due to enlargement and irritation of the oculomotor nerve. Patient was loaded with 300 mg of Plavix and 325 mg of aspirin. ACCESS: The technical aspects of the procedure as well as its potential risks and benefits were reviewed with the patient and patient's family. These risks included but were not limited to stroke, intracranial hemorrhage, bleeding, infection, allergic reaction, damage to organs or vital structures, stroke, non-diagnostic procedure, and the catastrophic outcomes of heart attack, coma, and death. With an understanding of these risks, informed consent was obtained and witnessed. The patient was placed in the supine position on the angiography table and the skin of right groin prepped in the usual sterile fashion. The procedure was performed under general anesthesia. A 5-French sheath was introduced in the right common femoral artery using Seldinger technique. MEDICATIONS: HEPARIN: 8000 Units total. INTEGRILIN: 16mg  IA, 60mcg/kg/min IV continuous infusion CONTRAST:  33mL OMNIPAQUE IOHEXOL 300 MG/ML SOLN, 51mL OMNIPAQUE IOHEXOL 300 MG/ML SOLN, 31mL OMNIPAQUE IOHEXOL 300 MG/ML SOLNcc, Omnipaque 300 FLUOROSCOPY TIME:  FLUOROSCOPY TIME: See IR records TECHNIQUE: CATHETERS AND WIRES 5-French JB-1 catheter 180 cm 0.035" glidewire 6-French NeuronMax guide sheath 6-French Berenstein Select JB-1 catheter 0.070 Sofia guide catheter 0.058" CatV guidecatheter Synchro select standard microwire Synchro select soft microwire Asahi Chikai 10 microwire Excelsior XT-17 microcatheter x 2 COILS/STENTS USED Target 3D 6 mm x 15 cm Target 360 XL soft 5 mm by 10 cm Target 360 XL 4 mm x 12 cm Target 360 XL 3 mm x 9 cm not deployed Target 360 ultra 2 mm x 4 cm Target 360 ultra 2.5 mm x 4 cm x2 Target 360 ultra 2  mm x 4 cm x2 Neuroform Atlas 3.0 mm x 24 mm VESSELS CATHETERIZED Right internal carotid Left internal carotid Left vertebral Right vertebral Right common femoral Basilar artery Right posterior cerebral artery VESSELS STUDIED Right internal carotid, head Left internal carotid, head Left vertebral Right vertebral Right vertebral artery, head (during embolization) Right vertebral artery, head (post-coiling) Left vertebral artery, head (pre stent) Left vertebral artery, head (immediate post stent) Left vertebral artery, head (during coiling) Left vertebral artery, head (immediate post coiling) Left vertebral artery, head (post thrombolysis) PROCEDURAL NARRATIVE A 5-Fr JB-1 glide catheter was advanced over a 0.035 glidewire into the aortic arch. The above vessels were then sequentially catheterized and cervical / cerebral angiograms taken. After review of images, the catheter was removed without incident. The 5-Fr sheath was then exchanged over the glidewire for an 8-Fr sheath. Under real-time fluoroscopy, the guide sheath was advanced over the select catheter and glidewire into the descending aorta. 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 The guide sheath was then advanced into the proximal right vertebral artery. The Select catheter was removed and the 070 guide catheter was coaxially introduced over both the XT-17 micro catheters and synchro select micro wires. The microcatheters were then advanced under roadmap guidance into the distal cervical vertebral artery. The guide catheter was then tracked over the microcatheter into the distal vertebral artery. The micro catheters were then further advanced into the proximal basilar artery. This allowed advancement of the guide catheter into the proximal basilar artery. Initially, multiple attempts were made to access the right posterior cerebral artery unsuccessfully. This  included a combination of different shape microcatheter tip. I then attempted access with the Chikai 10 microwire again with multiple different tip configurations all unsuccessfully. At this point I elected to attempt to place coils within the aneurysm without the aid of the stent as it did not appear access to the right posterior cerebral artery would be feasible. The three-dimensional coil as well as several filling coils were placed into the aneurysm. There is a small amount of coil loop extending down near the origin of the right posterior cerebral artery. As the last coil was being placed, some coil loops appear to be extending down further to words the origin of the posterior cerebral arteries and I therefore elected to stop coiling at this point. Angiogram was taken and the micro catheters were withdrawn down into the guide catheter. Guide catheter was then withdrawn down into the cervical vertebral artery and angiogram was taken. This did reveal decreased flow through the right posterior cerebral artery suggesting some compromise of the ostium possibly due to coil mass. I therefore elected to attempt to place a  stent by accessing the left vertebral artery. The guide catheter was removed, and the guide sheath was withdrawn down into the descending aorta as was the guide sheath. The select catheter was introduced over the Glidewire, and the left subclavian followed by the proximal left vertebral artery were selected. Guide sheath was then advanced into the proximal left vertebral artery. Under roadmap guidance, the 058 CT 5 guide catheter was introduced over and XT 17 microcatheter and synchro select microwire. These were then navigated until the CAT 5 guide catheter was in the proximal basilar artery. The synchro select microwire was then used to access the right posterior cerebral artery. I was able to gain access to this vessel by buttressing against the coil mass already placed in the aneurysm.  Microcatheter was then tracked into the P2 segment. Microwire was removed, and the above stent was introduced. Stent was then deployed under roadmap guidance without incident. The microcatheter was then advanced over the pushing wire into the lumen of the stent. Angiogram was then taken. It did appear that flow through the right posterior cerebral artery was improved in comparison to the prior angiogram. With the posterior cerebral artery somewhat protected, we elected to attempt to coil more of the residual aneurysm. The microwire was introduced into the microcatheter, and advanced into the lumen of the aneurysm through the stent. Microcatheter was then advanced into the residual aneurysm. The remainder of the above coils were then sequentially placed. After several coils were placed, I elected to complete the coiling procedure as I did not want to risk coil herniation through the tines of the stent and further compromise the posterior cerebral arteries. The coiling catheter was withdrawn, and the guide catheter was withdrawn down into the cervical vertebral artery. At this point, angiogram was taken which appear to reveal more decreased flow in the right posterior cerebral artery and what appear to be filling defect in the inferior wall of the right posterior cerebral artery adjacent to the coil mass. Therefore under roadmap guidance, I advanced the microcatheter over the microwire back into the lumen of the stent. I then administered a 8 mg bolus dose of Integrilin intra-arterially. After approximately 10 minutes, angiogram was taken which did reveal somewhat increased flow through the right posterior cerebral artery and better perfusion. I then administered another 8 mg bolus dose intra-arterially. After another 10 minutes, another angiogram was taken again revealing improved flow through the right posterior cerebral artery. At this point, the pharmacy was able to deliver intravenous Integrilin which was started  at a rate of 2 micrograms/kg/minute with the plan to continue for 18 hours. With improved flow in the right posterior cerebral artery, I elected to complete the procedure at this point. The guide catheter and guide sheath were then synchronously removed without incident. FINDINGS: Right internal carotid, head: Injection reveals the presence of a widely patent ICA, M1, and A1 segments and their branches. No aneurysms, AVMs, or high-flow fistulas are seen. Of note, the posterior communicating artery is very small and no visualization of the posterior circulation is noted. The parenchymal and venous phases are normal. The venous sinuses are widely patent. Left internal carotid, head: Injection reveals the presence of a widely patent ICA, A1, and M1 segments and their branches. No aneurysms, AVMs, or high-flow fistulas are seen. Similar to the contralateral side, there is no significant visualization of the left posterior communicating artery. The parenchymal and venous phases are normal. The venous sinuses are widely patent. Left vertebral: Injection reveals the  presence of a widely patent vertebral artery. This leads to a widely patent basilar artery that terminates in bilateral P1. 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 This aneurysm measures approximately 6.8 x 5.8 mm with a neck measuring approximately 4.5 mm. The parenchymal and venous phases are normal. The venous sinuses are widely patent. Right vertebral: The vertebral artery is widely patent. No PICA aneurysm is seen. See basilar description above. Right vertebral artery, head (during embolization) Angiograms taken during coiling of the basilar aneurysm through the right vertebral artery access demonstrate progressive occlusion primarily of the dome of the aneurysm. The right vertebral artery and bilateral posterior cerebral arteries appear to be  widely patent. There is residual aneurysm both at the neck and centrally within the aneurysm. No filling defects are noted. Right vertebral artery, head (post coiling) Angiogram taken after coiling through the right vertebral artery reveal occlusion of the dome of the aneurysm although there is residual aneurysm centrally and at the neck. There are small coil loops which appear to be in proximity to the origin of the right posterior cerebral artery. No filling defects are seen however flow through the right posterior cerebral artery is delayed in comparison to the left PCA, as well as in comparison to earlier angiograms suggesting flow limitation. Left vertebral artery, head (pre stent) Angiogram taken through the guide catheter in the left vertebral artery again demonstrates coil mass within the aneurysm with the residual filling as described above. Again seen is delayed filling of the right posterior cerebral artery with decreased perfusion in comparison to prior angiograms. Again, no filling defects are seen to suggest thrombus. Left vertebral artery, head (immediate post stent) Angiogram reveals widely patent left vertebral artery with stent extending from the mid basilar into the right P2 segment. No filling defects are seen. Flow within the right posterior cerebral artery appears to be improved in comparison to the pre stent angiogram with more symmetric filling of the bilateral posterior cerebral arteries. Left vertebral artery, head (during coiling) Angiogram reveals further coil mass within the residual aneurysm which is progressively occluded. No filling defects are noted. The posterior cerebral arteries remain patent. Left vertebral artery, head (immediate post coiling) Angiogram taken from the left vertebral artery reveals patent basilar artery. The left posterior cerebral artery remains widely patent. Stent is in stable position. There is coil mass within the aneurysm, with minimal neck residual. There  is decreased flow through the right posterior cerebral artery with filling defect in the inferior wall of the right P1 adjacent to the distal aneurysm neck likely representing thrombus. There is decreased perfusion of the right posterior cerebral artery territory in comparison to the prior angiograms. Left vertebral artery, head (post thrombolysis) Angiograms taken 10 minutes after initial 8 mg bolus dose and taken again 10 minutes after the second 8 mg bolus dose sequentially reveal improvement in flow through the right posterior cerebral artery. There is also much smaller filling defect along the wall of the posterior cerebral artery adjacent to the coil mass indicating dissolution of some of the thrombus. The final angiogram does show minimally decreased flow through the right posterior cerebral artery with significantly improved perfusion of the right PCA territory in comparison to the previous angiograms. Venous sinuses remain widely patent. DISPOSITION: Upon completion of the study, the femoral sheath was secured in place with nylon suture and connected to a pressure system. The patient was extubated  and transferred to the postanesthesia care unit in stable hemodynamic condition. IMPRESSION: 1. 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 Procedure was complicated by stent thrombosis treated with intra arterial and intravenous Integrilin. The preliminary results of this procedure were shared with the patient's family. Electronically Signed   By: Consuella Lose   On: 11/19/2020 20:28   IR Angiogram Follow Up Study  Result Date: 11/19/2020 PROCEDURE: DIAGNOSTIC CEREBRAL ANGIOGRAM COIL EMBOLIZATION OF BASILAR ANEURYSM HISTORY: The patient is a 60 year old woman presenting to the emergency department with several episodes of diplopia and approximally 1 week of significantly worsening headache. 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 She was therefore admitted to the hospital. After review of CTA it was felt that she would likely be a candidate for stent supported coiling. It was felt that her aneurysm was likely the cause of her new symptoms likely due to enlargement and irritation of the oculomotor nerve. Patient was loaded with 300 mg of Plavix and 325 mg of aspirin. ACCESS: The technical aspects of the procedure as well as its potential risks and benefits were reviewed with the patient and patient's family. These risks included but were not limited to stroke, intracranial hemorrhage, bleeding, infection, allergic reaction, damage to organs or vital structures, stroke, non-diagnostic procedure, and the catastrophic outcomes of heart attack, coma, and death. With an understanding of these risks, informed consent was obtained and witnessed. The patient was placed in the supine position on the angiography table and the skin of right groin prepped in the usual sterile fashion. The procedure was performed under general anesthesia. A 5-French sheath was introduced in the right common femoral artery using Seldinger technique. MEDICATIONS: HEPARIN: 8000 Units total. INTEGRILIN: 16mg  IA, 33mcg/kg/min IV continuous infusion CONTRAST:  45mL OMNIPAQUE IOHEXOL 300 MG/ML SOLN, 53mL OMNIPAQUE IOHEXOL 300 MG/ML SOLN, 71mL OMNIPAQUE IOHEXOL 300 MG/ML SOLNcc, Omnipaque 300 FLUOROSCOPY TIME:  FLUOROSCOPY TIME: See IR records TECHNIQUE: CATHETERS AND WIRES 5-French JB-1 catheter 180 cm 0.035" glidewire 6-French NeuronMax guide sheath 6-French Berenstein Select JB-1 catheter 0.070 Sofia guide catheter 0.058" CatV guidecatheter Synchro select standard microwire Synchro select soft microwire Asahi Chikai 10 microwire Excelsior XT-17 microcatheter x 2 COILS/STENTS USED Target 3D 6 mm x 15 cm Target 360 XL soft 5 mm by 10 cm Target 360 XL 4 mm x 12 cm Target 360 XL 3 mm x 9 cm  not deployed Target 360 ultra 2 mm x 4 cm Target 360 ultra 2.5 mm x 4 cm x2 Target 360 ultra 2 mm x 4 cm x2 Neuroform Atlas 3.0 mm x 24 mm VESSELS CATHETERIZED Right internal carotid Left internal carotid Left vertebral Right vertebral Right common femoral Basilar artery Right posterior cerebral artery VESSELS STUDIED Right internal carotid, head Left internal carotid, head Left vertebral Right vertebral Right vertebral artery, head (during embolization) Right vertebral artery, head (post-coiling) Left vertebral artery, head (pre stent) Left vertebral artery, head (immediate post stent) Left vertebral artery, head (during coiling) Left vertebral artery, head (immediate post coiling) Left vertebral artery, head (post thrombolysis) PROCEDURAL NARRATIVE A 5-Fr JB-1 glide catheter was advanced over a 0.035 glidewire into the aortic arch. The above vessels were then sequentially catheterized and cervical / cerebral angiograms taken. After review of images, the catheter was removed without incident. The 5-Fr sheath was then exchanged over the glidewire for an 8-Fr sheath. Under real-time fluoroscopy, the guide  sheath was advanced over the select catheter and glidewire into the descending aorta. 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 The guide sheath was then advanced into the proximal right vertebral artery. The Select catheter was removed and the 070 guide catheter was coaxially introduced over both the XT-17 micro catheters and synchro select micro wires. The microcatheters were then advanced under roadmap guidance into the distal cervical vertebral artery. The guide catheter was then tracked over the microcatheter into the distal vertebral artery. The micro catheters were then further advanced into the proximal basilar artery. This allowed advancement of the guide catheter into the proximal basilar artery. Initially,  multiple attempts were made to access the right posterior cerebral artery unsuccessfully. This included a combination of different shape microcatheter tip. I then attempted access with the Chikai 10 microwire again with multiple different tip configurations all unsuccessfully. At this point I elected to attempt to place coils within the aneurysm without the aid of the stent as it did not appear access to the right posterior cerebral artery would be feasible. The three-dimensional coil as well as several filling coils were placed into the aneurysm. There is a small amount of coil loop extending down near the origin of the right posterior cerebral artery. As the last coil was being placed, some coil loops appear to be extending down further to words the origin of the posterior cerebral arteries and I therefore elected to stop coiling at this point. Angiogram was taken and the micro catheters were withdrawn down into the guide catheter. Guide catheter was then withdrawn down into the cervical vertebral artery and angiogram was taken. This did reveal decreased flow through the right posterior cerebral artery suggesting some compromise of the ostium possibly due to coil mass. I therefore elected to attempt to place a stent by accessing the left vertebral artery. The guide catheter was removed, and the guide sheath was withdrawn down into the descending aorta as was the guide sheath. The select catheter was introduced over the Glidewire, and the left subclavian followed by the proximal left vertebral artery were selected. Guide sheath was then advanced into the proximal left vertebral artery. Under roadmap guidance, the 058 CT 5 guide catheter was introduced over and XT 17 microcatheter and synchro select microwire. These were then navigated until the CAT 5 guide catheter was in the proximal basilar artery. The synchro select microwire was then used to access the right posterior cerebral artery. I was able to gain access  to this vessel by buttressing against the coil mass already placed in the aneurysm. Microcatheter was then tracked into the P2 segment. Microwire was removed, and the above stent was introduced. Stent was then deployed under roadmap guidance without incident. The microcatheter was then advanced over the pushing wire into the lumen of the stent. Angiogram was then taken. It did appear that flow through the right posterior cerebral artery was improved in comparison to the prior angiogram. With the posterior cerebral artery somewhat protected, we elected to attempt to coil more of the residual aneurysm. The microwire was introduced into the microcatheter, and advanced into the lumen of the aneurysm through the stent. Microcatheter was then advanced into the residual aneurysm. The remainder of the above coils were then sequentially placed. After several coils were placed, I elected to complete the coiling procedure as I did not want to risk coil herniation through the tines of the stent and further  compromise the posterior cerebral arteries. The coiling catheter was withdrawn, and the guide catheter was withdrawn down into the cervical vertebral artery. At this point, angiogram was taken which appear to reveal more decreased flow in the right posterior cerebral artery and what appear to be filling defect in the inferior wall of the right posterior cerebral artery adjacent to the coil mass. Therefore under roadmap guidance, I advanced the microcatheter over the microwire back into the lumen of the stent. I then administered a 8 mg bolus dose of Integrilin intra-arterially. After approximately 10 minutes, angiogram was taken which did reveal somewhat increased flow through the right posterior cerebral artery and better perfusion. I then administered another 8 mg bolus dose intra-arterially. After another 10 minutes, another angiogram was taken again revealing improved flow through the right posterior cerebral artery. At  this point, the pharmacy was able to deliver intravenous Integrilin which was started at a rate of 2 micrograms/kg/minute with the plan to continue for 18 hours. With improved flow in the right posterior cerebral artery, I elected to complete the procedure at this point. The guide catheter and guide sheath were then synchronously removed without incident. FINDINGS: Right internal carotid, head: Injection reveals the presence of a widely patent ICA, M1, and A1 segments and their branches. No aneurysms, AVMs, or high-flow fistulas are seen. Of note, the posterior communicating artery is very small and no visualization of the posterior circulation is noted. The parenchymal and venous phases are normal. The venous sinuses are widely patent. Left internal carotid, head: Injection reveals the presence of a widely patent ICA, A1, and M1 segments and their branches. No aneurysms, AVMs, or high-flow fistulas are seen. Similar to the contralateral side, there is no significant visualization of the left posterior communicating artery. The parenchymal and venous phases are normal. The venous sinuses are widely patent. Left vertebral: Injection reveals the presence of a widely patent vertebral artery. This leads to a widely patent basilar artery that terminates in bilateral P1. 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 This aneurysm measures approximately 6.8 x 5.8 mm with a neck measuring approximately 4.5 mm. The parenchymal and venous phases are normal. The venous sinuses are widely patent. Right vertebral: The vertebral artery is widely patent. No PICA aneurysm is seen. See basilar description above. Right vertebral artery, head (during embolization) Angiograms taken during coiling of the basilar aneurysm through the right vertebral artery access demonstrate progressive occlusion primarily of the dome of the aneurysm.  The right vertebral artery and bilateral posterior cerebral arteries appear to be widely patent. There is residual aneurysm both at the neck and centrally within the aneurysm. No filling defects are noted. Right vertebral artery, head (post coiling) Angiogram taken after coiling through the right vertebral artery reveal occlusion of the dome of the aneurysm although there is residual aneurysm centrally and at the neck. There are small coil loops which appear to be in proximity to the origin of the right posterior cerebral artery. No filling defects are seen however flow through the right posterior cerebral artery is delayed in comparison to the left PCA, as well as in comparison to earlier angiograms suggesting flow limitation. Left vertebral artery, head (pre stent) Angiogram taken through the guide catheter in the left vertebral artery again demonstrates coil mass within the aneurysm with the residual filling as described above. Again seen is delayed filling of the right posterior cerebral artery  with decreased perfusion in comparison to prior angiograms. Again, no filling defects are seen to suggest thrombus. Left vertebral artery, head (immediate post stent) Angiogram reveals widely patent left vertebral artery with stent extending from the mid basilar into the right P2 segment. No filling defects are seen. Flow within the right posterior cerebral artery appears to be improved in comparison to the pre stent angiogram with more symmetric filling of the bilateral posterior cerebral arteries. Left vertebral artery, head (during coiling) Angiogram reveals further coil mass within the residual aneurysm which is progressively occluded. No filling defects are noted. The posterior cerebral arteries remain patent. Left vertebral artery, head (immediate post coiling) Angiogram taken from the left vertebral artery reveals patent basilar artery. The left posterior cerebral artery remains widely patent. Stent is in stable  position. There is coil mass within the aneurysm, with minimal neck residual. There is decreased flow through the right posterior cerebral artery with filling defect in the inferior wall of the right P1 adjacent to the distal aneurysm neck likely representing thrombus. There is decreased perfusion of the right posterior cerebral artery territory in comparison to the prior angiograms. Left vertebral artery, head (post thrombolysis) Angiograms taken 10 minutes after initial 8 mg bolus dose and taken again 10 minutes after the second 8 mg bolus dose sequentially reveal improvement in flow through the right posterior cerebral artery. There is also much smaller filling defect along the wall of the posterior cerebral artery adjacent to the coil mass indicating dissolution of some of the thrombus. The final angiogram does show minimally decreased flow through the right posterior cerebral artery with significantly improved perfusion of the right PCA territory in comparison to the previous angiograms. Venous sinuses remain widely patent. DISPOSITION: Upon completion of the study, the femoral sheath was secured in place with nylon suture and connected to a pressure system. The patient was extubated and transferred to the postanesthesia care unit in stable hemodynamic condition. IMPRESSION: 1. 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 Procedure was complicated by stent thrombosis treated with intra arterial and intravenous Integrilin. The preliminary results of this procedure were shared with the patient's family. Electronically Signed   By: Consuella Lose   On: 11/19/2020 20:28   IR Angiogram Follow Up Study  Result Date: 11/19/2020 PROCEDURE: DIAGNOSTIC CEREBRAL ANGIOGRAM COIL EMBOLIZATION OF BASILAR ANEURYSM HISTORY: The patient is a 60 year old woman presenting to the emergency department with several episodes of diplopia and approximally 1  week of significantly worsening headache. 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 She was therefore admitted to the hospital. After review of CTA it was felt that she would likely be a candidate for stent supported coiling. It was felt that her aneurysm was likely the cause of her new symptoms likely due to enlargement and irritation of the oculomotor nerve. Patient was loaded with 300 mg of Plavix and 325 mg of aspirin. ACCESS: The technical aspects of the procedure as well as its potential risks and benefits were reviewed with the patient and patient's family. These risks included but were not limited to stroke, intracranial hemorrhage, bleeding, infection, allergic reaction, damage to organs or vital structures, stroke, non-diagnostic procedure, and the catastrophic outcomes of heart attack, coma, and death. With an understanding of these risks, informed consent was obtained and witnessed. The patient was placed in the supine position on the angiography table and the skin  of right groin prepped in the usual sterile fashion. The procedure was performed under general anesthesia. A 5-French sheath was introduced in the right common femoral artery using Seldinger technique. MEDICATIONS: HEPARIN: 8000 Units total. INTEGRILIN: 16mg  IA, 77mcg/kg/min IV continuous infusion CONTRAST:  37mL OMNIPAQUE IOHEXOL 300 MG/ML SOLN, 23mL OMNIPAQUE IOHEXOL 300 MG/ML SOLN, 29mL OMNIPAQUE IOHEXOL 300 MG/ML SOLNcc, Omnipaque 300 FLUOROSCOPY TIME:  FLUOROSCOPY TIME: See IR records TECHNIQUE: CATHETERS AND WIRES 5-French JB-1 catheter 180 cm 0.035" glidewire 6-French NeuronMax guide sheath 6-French Berenstein Select JB-1 catheter 0.070 Sofia guide catheter 0.058" CatV guidecatheter Synchro select standard microwire Synchro select soft microwire [employer] Chikai 10 microwire Excelsior XT-17 microcatheter x 2 COILS/STENTS USED Target 3D 6 mm x 15  cm Target 360 XL soft 5 mm by 10 cm Target 360 XL 4 mm x 12 cm Target 360 XL 3 mm x 9 cm not deployed Target 360 ultra 2 mm x 4 cm Target 360 ultra 2.5 mm x 4 cm x2 Target 360 ultra 2 mm x 4 cm x2 Neuroform Atlas 3.0 mm x 24 mm VESSELS CATHETERIZED Right internal carotid Left internal carotid Left vertebral Right vertebral Right common femoral Basilar artery Right posterior cerebral artery VESSELS STUDIED Right internal carotid, head Left internal carotid, head Left vertebral Right vertebral Right vertebral artery, head (during embolization) Right vertebral artery, head (post-coiling) Left vertebral artery, head (pre stent) Left vertebral artery, head (immediate post stent) Left vertebral artery, head (during coiling) Left vertebral artery, head (immediate post coiling) Left vertebral artery, head (post thrombolysis) PROCEDURAL NARRATIVE A 5-Fr JB-1 glide catheter was advanced over a 0.035 glidewire into the aortic arch. The above vessels were then sequentially catheterized and cervical / cerebral angiograms taken. After review of images, the catheter was removed without incident. The 5-Fr sheath was then exchanged over the glidewire for an 8-Fr sheath. Under real-time fluoroscopy, the guide sheath was advanced over the select catheter and glidewire into the descending aorta. 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 The guide sheath was then advanced into the proximal right vertebral artery. The Select catheter was removed and the 070 guide catheter was coaxially introduced over both the XT-17 micro catheters and synchro select micro wires. The microcatheters were then advanced under roadmap guidance into the distal cervical vertebral artery. The guide catheter was then tracked over the microcatheter into the distal vertebral artery. The micro catheters were then further advanced into the proximal basilar artery. This  allowed advancement of the guide catheter into the proximal basilar artery. Initially, multiple attempts were made to access the right posterior cerebral artery unsuccessfully. This included a combination of different shape microcatheter tip. I then attempted access with the Chikai 10 microwire again with multiple different tip configurations all unsuccessfully. At this point I elected to attempt to place coils within the aneurysm without the aid of the stent as it did not appear access to the right posterior cerebral artery would be feasible. The three-dimensional coil as well as several filling coils were placed into the aneurysm. There is a small amount of coil loop extending down near the origin of the right posterior cerebral artery. As the last coil was being placed, some coil loops appear to be extending down further to words the origin of the posterior cerebral arteries and I therefore elected to stop coiling at this point. Angiogram was taken and the micro catheters were withdrawn down into the guide catheter. Guide  catheter was then withdrawn down into the cervical vertebral artery and angiogram was taken. This did reveal decreased flow through the right posterior cerebral artery suggesting some compromise of the ostium possibly due to coil mass. I therefore elected to attempt to place a stent by accessing the left vertebral artery. The guide catheter was removed, and the guide sheath was withdrawn down into the descending aorta as was the guide sheath. The select catheter was introduced over the Glidewire, and the left subclavian followed by the proximal left vertebral artery were selected. Guide sheath was then advanced into the proximal left vertebral artery. Under roadmap guidance, the 058 CT 5 guide catheter was introduced over and XT 17 microcatheter and synchro select microwire. These were then navigated until the CAT 5 guide catheter was in the proximal basilar artery. The synchro select microwire  was then used to access the right posterior cerebral artery. I was able to gain access to this vessel by buttressing against the coil mass already placed in the aneurysm. Microcatheter was then tracked into the P2 segment. Microwire was removed, and the above stent was introduced. Stent was then deployed under roadmap guidance without incident. The microcatheter was then advanced over the pushing wire into the lumen of the stent. Angiogram was then taken. It did appear that flow through the right posterior cerebral artery was improved in comparison to the prior angiogram. With the posterior cerebral artery somewhat protected, we elected to attempt to coil more of the residual aneurysm. The microwire was introduced into the microcatheter, and advanced into the lumen of the aneurysm through the stent. Microcatheter was then advanced into the residual aneurysm. The remainder of the above coils were then sequentially placed. After several coils were placed, I elected to complete the coiling procedure as I did not want to risk coil herniation through the tines of the stent and further compromise the posterior cerebral arteries. The coiling catheter was withdrawn, and the guide catheter was withdrawn down into the cervical vertebral artery. At this point, angiogram was taken which appear to reveal more decreased flow in the right posterior cerebral artery and what appear to be filling defect in the inferior wall of the right posterior cerebral artery adjacent to the coil mass. Therefore under roadmap guidance, I advanced the microcatheter over the microwire back into the lumen of the stent. I then administered a 8 mg bolus dose of Integrilin intra-arterially. After approximately 10 minutes, angiogram was taken which did reveal somewhat increased flow through the right posterior cerebral artery and better perfusion. I then administered another 8 mg bolus dose intra-arterially. After another 10 minutes, another angiogram  was taken again revealing improved flow through the right posterior cerebral artery. At this point, the pharmacy was able to deliver intravenous Integrilin which was started at a rate of 2 micrograms/kg/minute with the plan to continue for 18 hours. With improved flow in the right posterior cerebral artery, I elected to complete the procedure at this point. The guide catheter and guide sheath were then synchronously removed without incident. FINDINGS: Right internal carotid, head: Injection reveals the presence of a widely patent ICA, M1, and A1 segments and their branches. No aneurysms, AVMs, or high-flow fistulas are seen. Of note, the posterior communicating artery is very small and no visualization of the posterior circulation is noted. The parenchymal and venous phases are normal. The venous sinuses are widely patent. Left internal carotid, head: Injection reveals the presence of a widely patent ICA, A1, and M1 segments and  their branches. No aneurysms, AVMs, or high-flow fistulas are seen. Similar to the contralateral side, there is no significant visualization of the left posterior communicating artery. The parenchymal and venous phases are normal. The venous sinuses are widely patent. Left vertebral: Injection reveals the presence of a widely patent vertebral artery. This leads to a widely patent basilar artery that terminates in bilateral P1. 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 This aneurysm measures approximately 6.8 x 5.8 mm with a neck measuring approximately 4.5 mm. The parenchymal and venous phases are normal. The venous sinuses are widely patent. Right vertebral: The vertebral artery is widely patent. No PICA aneurysm is seen. See basilar description above. Right vertebral artery, head (during embolization) Angiograms taken during coiling of the basilar aneurysm through the right vertebral  artery access demonstrate progressive occlusion primarily of the dome of the aneurysm. The right vertebral artery and bilateral posterior cerebral arteries appear to be widely patent. There is residual aneurysm both at the neck and centrally within the aneurysm. No filling defects are noted. Right vertebral artery, head (post coiling) Angiogram taken after coiling through the right vertebral artery reveal occlusion of the dome of the aneurysm although there is residual aneurysm centrally and at the neck. There are small coil loops which appear to be in proximity to the origin of the right posterior cerebral artery. No filling defects are seen however flow through the right posterior cerebral artery is delayed in comparison to the left PCA, as well as in comparison to earlier angiograms suggesting flow limitation. Left vertebral artery, head (pre stent) Angiogram taken through the guide catheter in the left vertebral artery again demonstrates coil mass within the aneurysm with the residual filling as described above. Again seen is delayed filling of the right posterior cerebral artery with decreased perfusion in comparison to prior angiograms. Again, no filling defects are seen to suggest thrombus. Left vertebral artery, head (immediate post stent) Angiogram reveals widely patent left vertebral artery with stent extending from the mid basilar into the right P2 segment. No filling defects are seen. Flow within the right posterior cerebral artery appears to be improved in comparison to the pre stent angiogram with more symmetric filling of the bilateral posterior cerebral arteries. Left vertebral artery, head (during coiling) Angiogram reveals further coil mass within the residual aneurysm which is progressively occluded. No filling defects are noted. The posterior cerebral arteries remain patent. Left vertebral artery, head (immediate post coiling) Angiogram taken from the left vertebral artery reveals patent basilar  artery. The left posterior cerebral artery remains widely patent. Stent is in stable position. There is coil mass within the aneurysm, with minimal neck residual. There is decreased flow through the right posterior cerebral artery with filling defect in the inferior wall of the right P1 adjacent to the distal aneurysm neck likely representing thrombus. There is decreased perfusion of the right posterior cerebral artery territory in comparison to the prior angiograms. Left vertebral artery, head (post thrombolysis) Angiograms taken 10 minutes after initial 8 mg bolus dose and taken again 10 minutes after the second 8 mg bolus dose sequentially reveal improvement in flow through the right posterior cerebral artery. There is also much smaller filling defect along the wall of the posterior cerebral artery adjacent to the coil mass indicating dissolution of some of the thrombus. The final angiogram does show minimally decreased flow through the right posterior cerebral artery with significantly  improved perfusion of the right PCA territory in comparison to the previous angiograms. Venous sinuses remain widely patent. DISPOSITION: Upon completion of the study, the femoral sheath was secured in place with nylon suture and connected to a pressure system. The patient was extubated and transferred to the postanesthesia care unit in stable hemodynamic condition. IMPRESSION: 1. 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 Procedure was complicated by stent thrombosis treated with intra arterial and intravenous Integrilin. The preliminary results of this procedure were shared with the patient's family. Electronically Signed   By: Consuella Lose   On: 11/19/2020 20:28   IR Intra Cran Stent  Result Date: 11/19/2020 PROCEDURE: DIAGNOSTIC CEREBRAL ANGIOGRAM COIL EMBOLIZATION OF BASILAR ANEURYSM HISTORY: The patient is a 60 year old woman presenting to the  emergency department with several episodes of diplopia and approximally 1 week of significantly worsening headache. 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 She was therefore admitted to the hospital. After review of CTA it was felt that she would likely be a candidate for stent supported coiling. It was felt that her aneurysm was likely the cause of her new symptoms likely due to enlargement and irritation of the oculomotor nerve. Patient was loaded with 300 mg of Plavix and 325 mg of aspirin. ACCESS: The technical aspects of the procedure as well as its potential risks and benefits were reviewed with the patient and patient's family. These risks included but were not limited to stroke, intracranial hemorrhage, bleeding, infection, allergic reaction, damage to organs or vital structures, stroke, non-diagnostic procedure, and the catastrophic outcomes of heart attack, coma, and death. With an understanding of these risks, informed consent was obtained and witnessed. The patient was placed in the supine position on the angiography table and the skin of right groin prepped in the usual sterile fashion. The procedure was performed under general anesthesia. A 5-French sheath was introduced in the right common femoral artery using Seldinger technique. MEDICATIONS: HEPARIN: 8000 Units total. INTEGRILIN: 16mg  IA, 51mcg/kg/min IV continuous infusion CONTRAST:  75mL OMNIPAQUE IOHEXOL 300 MG/ML SOLN, 36mL OMNIPAQUE IOHEXOL 300 MG/ML SOLN, 72mL OMNIPAQUE IOHEXOL 300 MG/ML SOLNcc, Omnipaque 300 FLUOROSCOPY TIME:  FLUOROSCOPY TIME: See IR records TECHNIQUE: CATHETERS AND WIRES 5-French JB-1 catheter 180 cm 0.035" glidewire 6-French NeuronMax guide sheath 6-French Berenstein Select JB-1 catheter 0.070 Sofia guide catheter 0.058" CatV guidecatheter Synchro select standard microwire Synchro select soft microwire Asahi Chikai 10 microwire  Excelsior XT-17 microcatheter x 2 COILS/STENTS USED Target 3D 6 mm x 15 cm Target 360 XL soft 5 mm by 10 cm Target 360 XL 4 mm x 12 cm Target 360 XL 3 mm x 9 cm not deployed Target 360 ultra 2 mm x 4 cm Target 360 ultra 2.5 mm x 4 cm x2 Target 360 ultra 2 mm x 4 cm x2 Neuroform Atlas 3.0 mm x 24 mm VESSELS CATHETERIZED Right internal carotid Left internal carotid Left vertebral Right vertebral Right common femoral Basilar artery Right posterior cerebral artery VESSELS STUDIED Right internal carotid, head Left internal carotid, head Left vertebral Right vertebral Right vertebral artery, head (during embolization) Right vertebral artery, head (post-coiling) Left vertebral artery, head (pre stent) Left vertebral artery, head (immediate post stent) Left vertebral artery, head (during coiling) Left vertebral artery, head (immediate post coiling) Left vertebral artery, head (post thrombolysis) PROCEDURAL NARRATIVE A 5-Fr JB-1 glide catheter was advanced over a 0.035 glidewire into the  aortic arch. The above vessels were then sequentially catheterized and cervical / cerebral angiograms taken. After review of images, the catheter was removed without incident. The 5-Fr sheath was then exchanged over the glidewire for an 8-Fr sheath. Under real-time fluoroscopy, the guide sheath was advanced over the select catheter and glidewire into the descending aorta. 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 The guide sheath was then advanced into the proximal right vertebral artery. The Select catheter was removed and the 070 guide catheter was coaxially introduced over both the XT-17 micro catheters and synchro select micro wires. The microcatheters were then advanced under roadmap guidance into the distal cervical vertebral artery. The guide catheter was then tracked over the microcatheter into the distal vertebral artery. The micro  catheters were then further advanced into the proximal basilar artery. This allowed advancement of the guide catheter into the proximal basilar artery. Initially, multiple attempts were made to access the right posterior cerebral artery unsuccessfully. This included a combination of different shape microcatheter tip. I then attempted access with the Chikai 10 microwire again with multiple different tip configurations all unsuccessfully. At this point I elected to attempt to place coils within the aneurysm without the aid of the stent as it did not appear access to the right posterior cerebral artery would be feasible. The three-dimensional coil as well as several filling coils were placed into the aneurysm. There is a small amount of coil loop extending down near the origin of the right posterior cerebral artery. As the last coil was being placed, some coil loops appear to be extending down further to words the origin of the posterior cerebral arteries and I therefore elected to stop coiling at this point. Angiogram was taken and the micro catheters were withdrawn down into the guide catheter. Guide catheter was then withdrawn down into the cervical vertebral artery and angiogram was taken. This did reveal decreased flow through the right posterior cerebral artery suggesting some compromise of the ostium possibly due to coil mass. I therefore elected to attempt to place a stent by accessing the left vertebral artery. The guide catheter was removed, and the guide sheath was withdrawn down into the descending aorta as was the guide sheath. The select catheter was introduced over the Glidewire, and the left subclavian followed by the proximal left vertebral artery were selected. Guide sheath was then advanced into the proximal left vertebral artery. Under roadmap guidance, the 058 CT 5 guide catheter was introduced over and XT 17 microcatheter and synchro select microwire. These were then navigated until the CAT 5  guide catheter was in the proximal basilar artery. The synchro select microwire was then used to access the right posterior cerebral artery. I was able to gain access to this vessel by buttressing against the coil mass already placed in the aneurysm. Microcatheter was then tracked into the P2 segment. Microwire was removed, and the above stent was introduced. Stent was then deployed under roadmap guidance without incident. The microcatheter was then advanced over the pushing wire into the lumen of the stent. Angiogram was then taken. It did appear that flow through the right posterior cerebral artery was improved in comparison to the prior angiogram. With the posterior cerebral artery somewhat protected, we elected to attempt to coil more of the residual aneurysm. The microwire was introduced into the microcatheter, and advanced into the lumen of the aneurysm through the stent. Microcatheter was then advanced  into the residual aneurysm. The remainder of the above coils were then sequentially placed. After several coils were placed, I elected to complete the coiling procedure as I did not want to risk coil herniation through the tines of the stent and further compromise the posterior cerebral arteries. The coiling catheter was withdrawn, and the guide catheter was withdrawn down into the cervical vertebral artery. At this point, angiogram was taken which appear to reveal more decreased flow in the right posterior cerebral artery and what appear to be filling defect in the inferior wall of the right posterior cerebral artery adjacent to the coil mass. Therefore under roadmap guidance, I advanced the microcatheter over the microwire back into the lumen of the stent. I then administered a 8 mg bolus dose of Integrilin intra-arterially. After approximately 10 minutes, angiogram was taken which did reveal somewhat increased flow through the right posterior cerebral artery and better perfusion. I then administered another  8 mg bolus dose intra-arterially. After another 10 minutes, another angiogram was taken again revealing improved flow through the right posterior cerebral artery. At this point, the pharmacy was able to deliver intravenous Integrilin which was started at a rate of 2 micrograms/kg/minute with the plan to continue for 18 hours. With improved flow in the right posterior cerebral artery, I elected to complete the procedure at this point. The guide catheter and guide sheath were then synchronously removed without incident. FINDINGS: Right internal carotid, head: Injection reveals the presence of a widely patent ICA, M1, and A1 segments and their branches. No aneurysms, AVMs, or high-flow fistulas are seen. Of note, the posterior communicating artery is very small and no visualization of the posterior circulation is noted. The parenchymal and venous phases are normal. The venous sinuses are widely patent. Left internal carotid, head: Injection reveals the presence of a widely patent ICA, A1, and M1 segments and their branches. No aneurysms, AVMs, or high-flow fistulas are seen. Similar to the contralateral side, there is no significant visualization of the left posterior communicating artery. The parenchymal and venous phases are normal. The venous sinuses are widely patent. Left vertebral: Injection reveals the presence of a widely patent vertebral artery. This leads to a widely patent basilar artery that terminates in bilateral P1. 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 This aneurysm measures approximately 6.8 x 5.8 mm with a neck measuring approximately 4.5 mm. The parenchymal and venous phases are normal. The venous sinuses are widely patent. Right vertebral: The vertebral artery is widely patent. No PICA aneurysm is seen. See basilar description above. Right vertebral artery, head (during embolization)  Angiograms taken during coiling of the basilar aneurysm through the right vertebral artery access demonstrate progressive occlusion primarily of the dome of the aneurysm. The right vertebral artery and bilateral posterior cerebral arteries appear to be widely patent. There is residual aneurysm both at the neck and centrally within the aneurysm. No filling defects are noted. Right vertebral artery, head (post coiling) Angiogram taken after coiling through the right vertebral artery reveal occlusion of the dome of the aneurysm although there is residual aneurysm centrally and at the neck. There are small coil loops which appear to be in proximity to the origin of the right posterior cerebral artery. No filling defects are seen however flow through the right posterior cerebral artery is delayed in comparison to the left PCA, as well as in comparison to earlier angiograms suggesting flow  limitation. Left vertebral artery, head (pre stent) Angiogram taken through the guide catheter in the left vertebral artery again demonstrates coil mass within the aneurysm with the residual filling as described above. Again seen is delayed filling of the right posterior cerebral artery with decreased perfusion in comparison to prior angiograms. Again, no filling defects are seen to suggest thrombus. Left vertebral artery, head (immediate post stent) Angiogram reveals widely patent left vertebral artery with stent extending from the mid basilar into the right P2 segment. No filling defects are seen. Flow within the right posterior cerebral artery appears to be improved in comparison to the pre stent angiogram with more symmetric filling of the bilateral posterior cerebral arteries. Left vertebral artery, head (during coiling) Angiogram reveals further coil mass within the residual aneurysm which is progressively occluded. No filling defects are noted. The posterior cerebral arteries remain patent. Left vertebral artery, head (immediate  post coiling) Angiogram taken from the left vertebral artery reveals patent basilar artery. The left posterior cerebral artery remains widely patent. Stent is in stable position. There is coil mass within the aneurysm, with minimal neck residual. There is decreased flow through the right posterior cerebral artery with filling defect in the inferior wall of the right P1 adjacent to the distal aneurysm neck likely representing thrombus. There is decreased perfusion of the right posterior cerebral artery territory in comparison to the prior angiograms. Left vertebral artery, head (post thrombolysis) Angiograms taken 10 minutes after initial 8 mg bolus dose and taken again 10 minutes after the second 8 mg bolus dose sequentially reveal improvement in flow through the right posterior cerebral artery. There is also much smaller filling defect along the wall of the posterior cerebral artery adjacent to the coil mass indicating dissolution of some of the thrombus. The final angiogram does show minimally decreased flow through the right posterior cerebral artery with significantly improved perfusion of the right PCA territory in comparison to the previous angiograms. Venous sinuses remain widely patent. DISPOSITION: Upon completion of the study, the femoral sheath was secured in place with nylon suture and connected to a pressure system. The patient was extubated and transferred to the postanesthesia care unit in stable hemodynamic condition. IMPRESSION: 1. 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 Procedure was complicated by stent thrombosis treated with intra arterial and intravenous Integrilin. The preliminary results of this procedure were shared with the patient's family. Electronically Signed   By: Consuella Lose   On: 11/19/2020 20:28   ECHOCARDIOGRAM COMPLETE  Result Date: 12/05/2020    ECHOCARDIOGRAM REPORT   Patient Name:   [facility] Date of Exam: 12/05/2020 Medical Rec #:  [id number]      Height:       66.0 in Accession #:    [id number]     Weight:       186.7 lb Date of Birth:  08-16-1960      BSA:          1.942 m Patient Age:    60 years       BP:           110/67 mmHg Patient Gender: F              HR:           73 bpm. Exam Location:  Inpatient Procedure: 2D Echo Indications:    stroke  History:        Patient has no prior history of  Echocardiogram examinations.  Sonographer:    Holt Referring Phys: [id number] Otis  1. Left ventricular ejection fraction, by estimation, is 60 to 65%. The left ventricle has normal function. The left ventricle has no regional wall motion abnormalities. Left ventricular diastolic parameters were normal.  2. Right ventricular systolic function is normal. The right ventricular size is normal.  3. The mitral valve is normal in structure. No evidence of mitral valve regurgitation. No evidence of mitral stenosis.  4. The aortic valve is normal in structure. Aortic valve regurgitation is not visualized. No aortic stenosis is present.  5. The inferior vena cava is normal in size with greater than 50% respiratory variability, suggesting right atrial pressure of 3 mmHg. FINDINGS  Left Ventricle: Left ventricular ejection fraction, by estimation, is 60 to 65%. The left ventricle has normal function. The left ventricle has no regional wall motion abnormalities. The left ventricular internal cavity size was normal in size. There is  no left ventricular hypertrophy. Left ventricular diastolic parameters were normal. Right Ventricle: The right ventricular size is normal. No increase in right ventricular wall thickness. Right ventricular systolic function is normal. Left Atrium: Left atrial size was normal in size. Right Atrium: Right atrial size was normal in size. Pericardium: There is no evidence of pericardial effusion. Mitral Valve: The mitral valve is normal in structure. No  evidence of mitral valve regurgitation. No evidence of mitral valve stenosis. Tricuspid Valve: The tricuspid valve is normal in structure. Tricuspid valve regurgitation is trivial. No evidence of tricuspid stenosis. Aortic Valve: The aortic valve is normal in structure. Aortic valve regurgitation is not visualized. No aortic stenosis is present. Pulmonic Valve: The pulmonic valve was normal in structure. Pulmonic valve regurgitation is not visualized. No evidence of pulmonic stenosis. Aorta: The aortic root is normal in size and structure. Venous: The inferior vena cava is normal in size with greater than 50% respiratory variability, suggesting right atrial pressure of 3 mmHg. IAS/Shunts: No atrial level shunt detected by color flow Doppler.  LEFT VENTRICLE PLAX 2D LVIDd:         3.60 cm   Diastology LVIDs:         2.30 cm   LV e' medial:    8.49 cm/s LV PW:         1.00 cm   LV E/e' medial:  7.5 LV IVS:        1.10 cm   LV e' lateral:   9.36 cm/s LVOT diam:     1.90 cm   LV E/e' lateral: 6.8 LV SV:         48 LV SV Index:   25 LVOT Area:     2.84 cm  RIGHT VENTRICLE             IVC RV S prime:     16.90 cm/s  IVC diam: 0.90 cm TAPSE (M-mode): 2.1 cm LEFT ATRIUM             Index        RIGHT ATRIUM           Index LA diam:        3.20 cm 1.65 cm/m   RA Area:     10.70 cm LA Vol (A2C):   27.0 ml 13.90 ml/m  RA Volume:   21.70 ml  11.17 ml/m LA Vol (A4C):   36.1 ml 18.59 ml/m LA Biplane Vol: 32.4 ml 16.68 ml/m  AORTIC VALVE LVOT Vmax:  89.30 cm/s LVOT Vmean:  58.900 cm/s LVOT VTI:    0.171 m  AORTA Ao Root diam: 3.20 cm Ao Asc diam:  3.30 cm MITRAL VALVE MV Area (PHT): 3.31 cm    SHUNTS MV Decel Time: 229 msec    Systemic VTI:  0.17 m MV E velocity: 63.60 cm/s  Systemic Diam: 1.90 cm MV A velocity: 65.60 cm/s MV E/A ratio:  0.97 Jenkins Rouge MD Electronically signed by Jenkins Rouge MD Signature Date/Time: 12/05/2020/11:04:17 AM    Final    CT HEAD CODE STROKE WO CONTRAST  Result Date: 12/04/2020 CLINICAL  DATA:  Code stroke. Neuro deficit, acute, stroke suspected. Left-sided weakness with tingling in the arm and leg. Visual deficit with blurry vision following treatment of an aneurysm last month. EXAM: CT HEAD WITHOUT CONTRAST TECHNIQUE: Contiguous axial images were obtained from the base of the skull through the vertex without intravenous contrast. COMPARISON:  Head MRI 12/02/2020 FINDINGS: Brain: There is no evidence of an acute large territory infarct, intracranial hemorrhage, mass, midline shift, or extra-axial fluid collection. There is slight hypoattenuation in the right occipital lobe in the region of the small acute to subacute infarcts on the recent prior MRI. The ventricles and sulci are normal. 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 No gross hyperdense vessel. Skull: No fracture or suspicious osseous lesion. Sinuses/Orbits: Visualized paranasal sinuses and mastoid air cells are clear. Unremarkable orbits. Other: None. ASPECTS [facility] Stroke Program Early CT Score) - Ganglionic level infarction (caudate, lentiform nuclei, internal capsule, insula, M1-M3 cortex): 7 - Supraganglionic infarction (M4-M6 cortex): 3 Total score (0-10 with 10 being normal): 10 IMPRESSION: 1. No intracranial hemorrhage or definite new infarct. ASPECTS of 10. 2. Small subacute right occipital infarcts. These results were communicated to Dr. Rory Percy at 2:55 pm on 12/04/2020 by text page via the [facility] messaging system. Electronically Signed   By: Logan Bores M.D.   On: 12/04/2020 14:55   IR ANGIO INTRA EXTRACRAN SEL INTERNAL CAROTID BILAT MOD SED  Result Date: 11/19/2020 PROCEDURE: DIAGNOSTIC CEREBRAL ANGIOGRAM COIL EMBOLIZATION OF BASILAR ANEURYSM HISTORY: The patient is a 60 year old woman presenting to the emergency department with several episodes of diplopia and approximally 1 week of significantly worsening headache. Patient underwent CT and CT angiogram in the emergency department demonstrating a relatively  large right eccentric basilar apex aneurysm. She was therefore admitted to the hospital. After review of CTA it was felt that she would likely be a candidate for stent supported coiling. It was felt that her aneurysm was likely the cause of her new symptoms likely due to enlargement and irritation of the oculomotor nerve. Patient was loaded with 300 mg of Plavix and 325 mg of aspirin. ACCESS: The technical aspects of the procedure as well as its potential risks and benefits were reviewed with the patient and patient's family. These risks included but were not limited to stroke, intracranial hemorrhage, bleeding, infection, allergic reaction, damage to organs or vital structures, stroke, non-diagnostic procedure, and the catastrophic outcomes of heart attack, coma, and death. With an understanding of these risks, informed consent was obtained and witnessed. The patient was placed in the supine position on the angiography table and the skin of right groin prepped in the usual sterile fashion. The procedure was performed under general anesthesia. A 5-French sheath was introduced in the right common femoral artery using Seldinger technique. MEDICATIONS: HEPARIN: 8000 Units total. INTEGRILIN: 16mg  IA, 47mcg/kg/min IV continuous infusion CONTRAST:  66mL OMNIPAQUE IOHEXOL 300 MG/ML SOLN, 65mL OMNIPAQUE IOHEXOL  300 MG/ML SOLN, 30mL OMNIPAQUE IOHEXOL 300 MG/ML SOLNcc, Omnipaque 300 FLUOROSCOPY TIME:  FLUOROSCOPY TIME: See IR records TECHNIQUE: CATHETERS AND WIRES 5-French JB-1 catheter 180 cm 0.035" glidewire 6-French NeuronMax guide sheath 6-French Berenstein Select JB-1 catheter 0.070 Sofia guide catheter 0.058" CatV guidecatheter Synchro select standard microwire Synchro select soft microwire Asahi Chikai 10 microwire Excelsior XT-17 microcatheter x 2 COILS/STENTS USED Target 3D 6 mm x 15 cm Target 360 XL soft 5 mm by 10 cm Target 360 XL 4 mm x 12 cm Target 360 XL 3 mm x 9 cm not deployed Target 360 ultra 2 mm x 4 cm  Target 360 ultra 2.5 mm x 4 cm x2 Target 360 ultra 2 mm x 4 cm x2 Neuroform Atlas 3.0 mm x 24 mm VESSELS CATHETERIZED Right internal carotid Left internal carotid Left vertebral Right vertebral Right common femoral Basilar artery Right posterior cerebral artery VESSELS STUDIED Right internal carotid, head Left internal carotid, head Left vertebral Right vertebral Right vertebral artery, head (during embolization) Right vertebral artery, head (post-coiling) Left vertebral artery, head (pre stent) Left vertebral artery, head (immediate post stent) Left vertebral artery, head (during coiling) Left vertebral artery, head (immediate post coiling) Left vertebral artery, head (post thrombolysis) PROCEDURAL NARRATIVE A 5-Fr JB-1 glide catheter was advanced over a 0.035 glidewire into the aortic arch. The above vessels were then sequentially catheterized and cervical / cerebral angiograms taken. After review of images, the catheter was removed without incident. The 5-Fr sheath was then exchanged over the glidewire for an 8-Fr sheath. Under real-time fluoroscopy, the guide sheath was advanced over the select catheter and glidewire into the descending aorta. 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 The guide sheath was then advanced into the proximal right vertebral artery. The Select catheter was removed and the 070 guide catheter was coaxially introduced over both the XT-17 micro catheters and synchro select micro wires. The microcatheters were then advanced under roadmap guidance into the distal cervical vertebral artery. The guide catheter was then tracked over the microcatheter into the distal vertebral artery. The micro catheters were then further advanced into the proximal basilar artery. This allowed advancement of the guide catheter into the proximal basilar artery. Initially, multiple attempts were made to access the  right posterior cerebral artery unsuccessfully. This included a combination of different shape microcatheter tip. I then attempted access with the Chikai 10 microwire again with multiple different tip configurations all unsuccessfully. At this point I elected to attempt to place coils within the aneurysm without the aid of the stent as it did not appear access to the right posterior cerebral artery would be feasible. The three-dimensional coil as well as several filling coils were placed into the aneurysm. There is a small amount of coil loop extending down near the origin of the right posterior cerebral artery. As the last coil was being placed, some coil loops appear to be extending down further to words the origin of the posterior cerebral arteries and I therefore elected to stop coiling at this point. Angiogram was taken and the micro catheters were withdrawn down into the guide catheter. Guide catheter was then withdrawn down into the cervical vertebral artery and angiogram was taken. This did reveal decreased flow through the right posterior cerebral artery suggesting some compromise of the ostium possibly due to coil mass. I therefore elected to attempt to place a stent by accessing the left vertebral artery. The guide  catheter was removed, and the guide sheath was withdrawn down into the descending aorta as was the guide sheath. The select catheter was introduced over the Glidewire, and the left subclavian followed by the proximal left vertebral artery were selected. Guide sheath was then advanced into the proximal left vertebral artery. Under roadmap guidance, the 058 CT 5 guide catheter was introduced over and XT 17 microcatheter and synchro select microwire. These were then navigated until the CAT 5 guide catheter was in the proximal basilar artery. The synchro select microwire was then used to access the right posterior cerebral artery. I was able to gain access to this vessel by buttressing against the  coil mass already placed in the aneurysm. Microcatheter was then tracked into the P2 segment. Microwire was removed, and the above stent was introduced. Stent was then deployed under roadmap guidance without incident. The microcatheter was then advanced over the pushing wire into the lumen of the stent. Angiogram was then taken. It did appear that flow through the right posterior cerebral artery was improved in comparison to the prior angiogram. With the posterior cerebral artery somewhat protected, we elected to attempt to coil more of the residual aneurysm. The microwire was introduced into the microcatheter, and advanced into the lumen of the aneurysm through the stent. Microcatheter was then advanced into the residual aneurysm. The remainder of the above coils were then sequentially placed. After several coils were placed, I elected to complete the coiling procedure as I did not want to risk coil herniation through the tines of the stent and further compromise the posterior cerebral arteries. The coiling catheter was withdrawn, and the guide catheter was withdrawn down into the cervical vertebral artery. At this point, angiogram was taken which appear to reveal more decreased flow in the right posterior cerebral artery and what appear to be filling defect in the inferior wall of the right posterior cerebral artery adjacent to the coil mass. Therefore under roadmap guidance, I advanced the microcatheter over the microwire back into the lumen of the stent. I then administered a 8 mg bolus dose of Integrilin intra-arterially. After approximately 10 minutes, angiogram was taken which did reveal somewhat increased flow through the right posterior cerebral artery and better perfusion. I then administered another 8 mg bolus dose intra-arterially. After another 10 minutes, another angiogram was taken again revealing improved flow through the right posterior cerebral artery. At this point, the pharmacy was able to  deliver intravenous Integrilin which was started at a rate of 2 micrograms/kg/minute with the plan to continue for 18 hours. With improved flow in the right posterior cerebral artery, I elected to complete the procedure at this point. The guide catheter and guide sheath were then synchronously removed without incident. FINDINGS: Right internal carotid, head: Injection reveals the presence of a widely patent ICA, M1, and A1 segments and their branches. No aneurysms, AVMs, or high-flow fistulas are seen. Of note, the posterior communicating artery is very small and no visualization of the posterior circulation is noted. The parenchymal and venous phases are normal. The venous sinuses are widely patent. Left internal carotid, head: Injection reveals the presence of a widely patent ICA, A1, and M1 segments and their branches. No aneurysms, AVMs, or high-flow fistulas are seen. Similar to the contralateral side, there is no significant visualization of the left posterior communicating artery. The parenchymal and venous phases are normal. The venous sinuses are widely patent. Left vertebral: Injection reveals the presence of a widely patent vertebral artery. This leads  to a widely patent basilar artery that terminates in bilateral P1. 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 This aneurysm measures approximately 6.8 x 5.8 mm with a neck measuring approximately 4.5 mm. The parenchymal and venous phases are normal. The venous sinuses are widely patent. Right vertebral: The vertebral artery is widely patent. No PICA aneurysm is seen. See basilar description above. Right vertebral artery, head (during embolization) Angiograms taken during coiling of the basilar aneurysm through the right vertebral artery access demonstrate progressive occlusion primarily of the dome of the aneurysm. The right vertebral artery and  bilateral posterior cerebral arteries appear to be widely patent. There is residual aneurysm both at the neck and centrally within the aneurysm. No filling defects are noted. Right vertebral artery, head (post coiling) Angiogram taken after coiling through the right vertebral artery reveal occlusion of the dome of the aneurysm although there is residual aneurysm centrally and at the neck. There are small coil loops which appear to be in proximity to the origin of the right posterior cerebral artery. No filling defects are seen however flow through the right posterior cerebral artery is delayed in comparison to the left PCA, as well as in comparison to earlier angiograms suggesting flow limitation. Left vertebral artery, head (pre stent) Angiogram taken through the guide catheter in the left vertebral artery again demonstrates coil mass within the aneurysm with the residual filling as described above. Again seen is delayed filling of the right posterior cerebral artery with decreased perfusion in comparison to prior angiograms. Again, no filling defects are seen to suggest thrombus. Left vertebral artery, head (immediate post stent) Angiogram reveals widely patent left vertebral artery with stent extending from the mid basilar into the right P2 segment. No filling defects are seen. Flow within the right posterior cerebral artery appears to be improved in comparison to the pre stent angiogram with more symmetric filling of the bilateral posterior cerebral arteries. Left vertebral artery, head (during coiling) Angiogram reveals further coil mass within the residual aneurysm which is progressively occluded. No filling defects are noted. The posterior cerebral arteries remain patent. Left vertebral artery, head (immediate post coiling) Angiogram taken from the left vertebral artery reveals patent basilar artery. The left posterior cerebral artery remains widely patent. Stent is in stable position. There is coil mass  within the aneurysm, with minimal neck residual. There is decreased flow through the right posterior cerebral artery with filling defect in the inferior wall of the right P1 adjacent to the distal aneurysm neck likely representing thrombus. There is decreased perfusion of the right posterior cerebral artery territory in comparison to the prior angiograms. Left vertebral artery, head (post thrombolysis) Angiograms taken 10 minutes after initial 8 mg bolus dose and taken again 10 minutes after the second 8 mg bolus dose sequentially reveal improvement in flow through the right posterior cerebral artery. There is also much smaller filling defect along the wall of the posterior cerebral artery adjacent to the coil mass indicating dissolution of some of the thrombus. The final angiogram does show minimally decreased flow through the right posterior cerebral artery with significantly improved perfusion of the right PCA territory in comparison to the previous angiograms. Venous sinuses remain widely patent. DISPOSITION: Upon completion of the study, the femoral sheath was secured in place with nylon suture and connected to a pressure system. The patient was extubated and transferred to the postanesthesia care unit in stable  hemodynamic condition. IMPRESSION: 1. 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 Procedure was complicated by stent thrombosis treated with intra arterial and intravenous Integrilin. The preliminary results of this procedure were shared with the patient's family. Electronically Signed   By: Consuella Lose   On: 11/19/2020 20:28   IR ANGIO VERTEBRAL SEL VERTEBRAL BILAT MOD SED  Result Date: 11/19/2020 PROCEDURE: DIAGNOSTIC CEREBRAL ANGIOGRAM COIL EMBOLIZATION OF BASILAR ANEURYSM HISTORY: The patient is a 60 year old woman presenting to the emergency department with several episodes of diplopia and approximally 1 week of  significantly worsening headache. 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 She was therefore admitted to the hospital. After review of CTA it was felt that she would likely be a candidate for stent supported coiling. It was felt that her aneurysm was likely the cause of her new symptoms likely due to enlargement and irritation of the oculomotor nerve. Patient was loaded with 300 mg of Plavix and 325 mg of aspirin. ACCESS: The technical aspects of the procedure as well as its potential risks and benefits were reviewed with the patient and patient's family. These risks included but were not limited to stroke, intracranial hemorrhage, bleeding, infection, allergic reaction, damage to organs or vital structures, stroke, non-diagnostic procedure, and the catastrophic outcomes of heart attack, coma, and death. With an understanding of these risks, informed consent was obtained and witnessed. The patient was placed in the supine position on the angiography table and the skin of right groin prepped in the usual sterile fashion. The procedure was performed under general anesthesia. A 5-French sheath was introduced in the right common femoral artery using Seldinger technique. MEDICATIONS: HEPARIN: 8000 Units total. INTEGRILIN: 16mg  IA, 25mcg/kg/min IV continuous infusion CONTRAST:  72mL OMNIPAQUE IOHEXOL 300 MG/ML SOLN, 70mL OMNIPAQUE IOHEXOL 300 MG/ML SOLN, 23mL OMNIPAQUE IOHEXOL 300 MG/ML SOLNcc, Omnipaque 300 FLUOROSCOPY TIME:  FLUOROSCOPY TIME: See IR records TECHNIQUE: CATHETERS AND WIRES 5-French JB-1 catheter 180 cm 0.035" glidewire 6-French NeuronMax guide sheath 6-French Berenstein Select JB-1 catheter 0.070 Sofia guide catheter 0.058" CatV guidecatheter Synchro select standard microwire Synchro select soft microwire As[employer] Chikai 10 microwire Excelsior XT-17 microcatheter x 2 COILS/STENTS USED Target 3D 6 mm x 15 cm Target  360 XL soft 5 mm by 10 cm Target 360 XL 4 mm x 12 cm Target 360 XL 3 mm x 9 cm not deployed Target 360 ultra 2 mm x 4 cm Target 360 ultra 2.5 mm x 4 cm x2 Target 360 ultra 2 mm x 4 cm x2 Neuroform Atlas 3.0 mm x 24 mm VESSELS CATHETERIZED Right internal carotid Left internal carotid Left vertebral Right vertebral Right common femoral Basilar artery Right posterior cerebral artery VESSELS STUDIED Right internal carotid, head Left internal carotid, head Left vertebral Right vertebral Right vertebral artery, head (during embolization) Right vertebral artery, head (post-coiling) Left vertebral artery, head (pre stent) Left vertebral artery, head (immediate post stent) Left vertebral artery, head (during coiling) Left vertebral artery, head (immediate post coiling) Left vertebral artery, head (post thrombolysis) PROCEDURAL NARRATIVE A 5-Fr JB-1 glide catheter was advanced over a 0.035 glidewire into the aortic arch. The above vessels were then sequentially catheterized and cervical / cerebral angiograms taken. After review of images, the catheter was removed without incident. The 5-Fr sheath was then exchanged over the glidewire for an 8-Fr sheath. Under real-time fluoroscopy, the guide sheath was advanced over the select  catheter and glidewire into the descending aorta. 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 The guide sheath was then advanced into the proximal right vertebral artery. The Select catheter was removed and the 070 guide catheter was coaxially introduced over both the XT-17 micro catheters and synchro select micro wires. The microcatheters were then advanced under roadmap guidance into the distal cervical vertebral artery. The guide catheter was then tracked over the microcatheter into the distal vertebral artery. The micro catheters were then further advanced into the proximal basilar artery. This allowed  advancement of the guide catheter into the proximal basilar artery. Initially, multiple attempts were made to access the right posterior cerebral artery unsuccessfully. This included a combination of different shape microcatheter tip. I then attempted access with the Chikai 10 microwire again with multiple different tip configurations all unsuccessfully. At this point I elected to attempt to place coils within the aneurysm without the aid of the stent as it did not appear access to the right posterior cerebral artery would be feasible. The three-dimensional coil as well as several filling coils were placed into the aneurysm. There is a small amount of coil loop extending down near the origin of the right posterior cerebral artery. As the last coil was being placed, some coil loops appear to be extending down further to words the origin of the posterior cerebral arteries and I therefore elected to stop coiling at this point. Angiogram was taken and the micro catheters were withdrawn down into the guide catheter. Guide catheter was then withdrawn down into the cervical vertebral artery and angiogram was taken. This did reveal decreased flow through the right posterior cerebral artery suggesting some compromise of the ostium possibly due to coil mass. I therefore elected to attempt to place a stent by accessing the left vertebral artery. The guide catheter was removed, and the guide sheath was withdrawn down into the descending aorta as was the guide sheath. The select catheter was introduced over the Glidewire, and the left subclavian followed by the proximal left vertebral artery were selected. Guide sheath was then advanced into the proximal left vertebral artery. Under roadmap guidance, the 058 CT 5 guide catheter was introduced over and XT 17 microcatheter and synchro select microwire. These were then navigated until the CAT 5 guide catheter was in the proximal basilar artery. The synchro select microwire was then  used to access the right posterior cerebral artery. I was able to gain access to this vessel by buttressing against the coil mass already placed in the aneurysm. Microcatheter was then tracked into the P2 segment. Microwire was removed, and the above stent was introduced. Stent was then deployed under roadmap guidance without incident. The microcatheter was then advanced over the pushing wire into the lumen of the stent. Angiogram was then taken. It did appear that flow through the right posterior cerebral artery was improved in comparison to the prior angiogram. With the posterior cerebral artery somewhat protected, we elected to attempt to coil more of the residual aneurysm. The microwire was introduced into the microcatheter, and advanced into the lumen of the aneurysm through the stent. Microcatheter was then advanced into the residual aneurysm. The remainder of the above coils were then sequentially placed. After several coils were placed, I elected to complete the coiling procedure as I did not want to risk coil herniation through the tines of the stent and further compromise the posterior cerebral arteries. The  coiling catheter was withdrawn, and the guide catheter was withdrawn down into the cervical vertebral artery. At this point, angiogram was taken which appear to reveal more decreased flow in the right posterior cerebral artery and what appear to be filling defect in the inferior wall of the right posterior cerebral artery adjacent to the coil mass. Therefore under roadmap guidance, I advanced the microcatheter over the microwire back into the lumen of the stent. I then administered a 8 mg bolus dose of Integrilin intra-arterially. After approximately 10 minutes, angiogram was taken which did reveal somewhat increased flow through the right posterior cerebral artery and better perfusion. I then administered another 8 mg bolus dose intra-arterially. After another 10 minutes, another angiogram was taken  again revealing improved flow through the right posterior cerebral artery. At this point, the pharmacy was able to deliver intravenous Integrilin which was started at a rate of 2 micrograms/kg/minute with the plan to continue for 18 hours. With improved flow in the right posterior cerebral artery, I elected to complete the procedure at this point. The guide catheter and guide sheath were then synchronously removed without incident. FINDINGS: Right internal carotid, head: Injection reveals the presence of a widely patent ICA, M1, and A1 segments and their branches. No aneurysms, AVMs, or high-flow fistulas are seen. Of note, the posterior communicating artery is very small and no visualization of the posterior circulation is noted. The parenchymal and venous phases are normal. The venous sinuses are widely patent. Left internal carotid, head: Injection reveals the presence of a widely patent ICA, A1, and M1 segments and their branches. No aneurysms, AVMs, or high-flow fistulas are seen. Similar to the contralateral side, there is no significant visualization of the left posterior communicating artery. The parenchymal and venous phases are normal. The venous sinuses are widely patent. Left vertebral: Injection reveals the presence of a widely patent vertebral artery. This leads to a widely patent basilar artery that terminates in bilateral P1. 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 This aneurysm measures approximately 6.8 x 5.8 mm with a neck measuring approximately 4.5 mm. The parenchymal and venous phases are normal. The venous sinuses are widely patent. Right vertebral: The vertebral artery is widely patent. No PICA aneurysm is seen. See basilar description above. Right vertebral artery, head (during embolization) Angiograms taken during coiling of the basilar aneurysm through the right vertebral artery  access demonstrate progressive occlusion primarily of the dome of the aneurysm. The right vertebral artery and bilateral posterior cerebral arteries appear to be widely patent. There is residual aneurysm both at the neck and centrally within the aneurysm. No filling defects are noted. Right vertebral artery, head (post coiling) Angiogram taken after coiling through the right vertebral artery reveal occlusion of the dome of the aneurysm although there is residual aneurysm centrally and at the neck. There are small coil loops which appear to be in proximity to the origin of the right posterior cerebral artery. No filling defects are seen however flow through the right posterior cerebral artery is delayed in comparison to the left PCA, as well as in comparison to earlier angiograms suggesting flow limitation. Left vertebral artery, head (pre stent) Angiogram taken through the guide catheter in the left vertebral artery again demonstrates coil mass within the aneurysm with the residual filling as described above. Again seen is delayed filling of the right posterior cerebral artery with decreased perfusion in comparison to  prior angiograms. Again, no filling defects are seen to suggest thrombus. Left vertebral artery, head (immediate post stent) Angiogram reveals widely patent left vertebral artery with stent extending from the mid basilar into the right P2 segment. No filling defects are seen. Flow within the right posterior cerebral artery appears to be improved in comparison to the pre stent angiogram with more symmetric filling of the bilateral posterior cerebral arteries. Left vertebral artery, head (during coiling) Angiogram reveals further coil mass within the residual aneurysm which is progressively occluded. No filling defects are noted. The posterior cerebral arteries remain patent. Left vertebral artery, head (immediate post coiling) Angiogram taken from the left vertebral artery reveals patent basilar artery.  The left posterior cerebral artery remains widely patent. Stent is in stable position. There is coil mass within the aneurysm, with minimal neck residual. There is decreased flow through the right posterior cerebral artery with filling defect in the inferior wall of the right P1 adjacent to the distal aneurysm neck likely representing thrombus. There is decreased perfusion of the right posterior cerebral artery territory in comparison to the prior angiograms. Left vertebral artery, head (post thrombolysis) Angiograms taken 10 minutes after initial 8 mg bolus dose and taken again 10 minutes after the second 8 mg bolus dose sequentially reveal improvement in flow through the right posterior cerebral artery. There is also much smaller filling defect along the wall of the posterior cerebral artery adjacent to the coil mass indicating dissolution of some of the thrombus. The final angiogram does show minimally decreased flow through the right posterior cerebral artery with significantly improved perfusion of the right PCA territory in comparison to the previous angiograms. Venous sinuses remain widely patent. DISPOSITION: Upon completion of the study, the femoral sheath was secured in place with nylon suture and connected to a pressure system. The patient was extubated and transferred to the postanesthesia care unit in stable hemodynamic condition. IMPRESSION: 1. 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 Procedure was complicated by stent thrombosis treated with intra arterial and intravenous Integrilin. The preliminary results of this procedure were shared with the patient's family. Electronically Signed   By: Consuella Lose   On: 11/19/2020 20:28       The results of significant diagnostics from this hospitalization (including imaging, microbiology, ancillary and laboratory) are listed below for reference.     Microbiology: Recent  Results (from the past 240 hour(s))  Resp Panel by RT-PCR (Flu A&B, Covid) Nasopharyngeal Swab     Status: None   Collection Time: 12/04/20  3:34 PM   Specimen: Nasopharyngeal Swab; Nasopharyngeal(NP) swabs in vial transport medium  Result Value Ref Range Status   SARS Coronavirus 2 by RT PCR NEGATIVE NEGATIVE Final    Comment: (NOTE) SARS-CoV-2 target nucleic acids are NOT DETECTED.  The SARS-CoV-2 RNA is generally detectable in upper respiratory specimens during the acute phase of infection. The lowest concentration of SARS-CoV-2 viral copies this assay can detect is 138 copies/mL. A negative result does not preclude SARS-Cov-2 infection and should not be used as the sole basis for treatment or other patient management decisions. A negative result may occur with  improper specimen collection/handling, submission of specimen other than nasopharyngeal swab, presence of viral mutation(s) within the areas targeted by this assay, and inadequate number of viral copies(<138 copies/mL). A negative result must be combined with clinical observations, patient history, and epidemiological information. The expected result is Negative.  Fact Sheet for Patients:  EntrepreneurPulse.com.au  Fact Sheet for Healthcare Providers:  IncredibleEmployment.be  This test is no t yet approved or cleared by the [place] FDA and  has been authorized for detection and/or diagnosis of SARS-CoV-2 by FDA under an Emergency Use Authorization (EUA). This EUA will remain  in effect (meaning this test can be used) for the duration of the COVID-19 declaration under Section 564(b)(1) of the Act, 21 U.S.C.section 360bbb-3(b)(1), unless the authorization is terminated  or revoked sooner.       Influenza A by PCR NEGATIVE NEGATIVE Final   Influenza B by PCR NEGATIVE NEGATIVE Final    Comment: (NOTE) The Xpert Xpress SARS-CoV-2/FLU/RSV plus assay is intended as an aid in the  diagnosis of influenza from Nasopharyngeal swab specimens and should not be used as a sole basis for treatment. Nasal washings and aspirates are unacceptable for Xpert Xpress SARS-CoV-2/FLU/RSV testing.  Fact Sheet for Patients: EntrepreneurPulse.com.au  Fact Sheet for Healthcare Providers: IncredibleEmployment.be  This test is not yet approved or cleared by the [place] FDA and has been authorized for detection and/or diagnosis of SARS-CoV-2 by FDA under an Emergency Use Authorization (EUA). This EUA will remain in effect (meaning this test can be used) for the duration of the COVID-19 declaration under Section 564(b)(1) of the Act, 21 U.S.C. section 360bbb-3(b)(1), unless the authorization is terminated or revoked.  Performed at [facility], [place] [address] [place], [place] [address]      Labs:  CBC: Recent Labs  Lab 12/04/20 1423 12/04/20 1431 12/06/20 0044  WBC 5.3  --  6.0  NEUTROABS 2.5  --   --   HGB 13.2 13.3 13.8  HCT 39.9 39.0 40.8  MCV 91.3  --  90.1  PLT 233  --  234   BMP &GFR Recent Labs  Lab 12/04/20 1423 12/04/20 1431 12/06/20 0044  NA 139 141 139  K 3.4* 3.4* 3.5  CL 107 105 105  CO2 23  --  25  GLUCOSE 120* 123* 139*  BUN 9 10 14   CREATININE 0.64 0.60 0.72  CALCIUM 8.6*  --  9.0  MG  --   --  2.0  PHOS  --   --  4.8*   Estimated Creatinine Clearance: 84.1 mL/min (by C-G formula based on SCr of 0.72 mg/dL). Liver & Pancreas: Recent Labs  Lab 12/04/20 1423 12/06/20 0044  AST 19  --   ALT 21  --   ALKPHOS 52  --   BILITOT 0.5  --   PROT 6.3*  --   ALBUMIN 3.6 3.5   No results for input(s): LIPASE, AMYLASE in the last 168 hours. No results for input(s): AMMONIA in the last 168 hours. Diabetic: Recent Labs    12/05/20 0434  HGBA1C 5.4   Recent Labs  Lab 12/04/20 1524  GLUCAP 115*   Cardiac Enzymes: No results for input(s): CKTOTAL, CKMB, CKMBINDEX, TROPONINI in the last 168  hours. No results for input(s): PROBNP in the last 8760 hours. Coagulation Profile: Recent Labs  Lab 12/04/20 1423  INR 1.0   Thyroid Function Tests: No results for input(s): TSH, T4TOTAL, FREET4, T3FREE, THYROIDAB in the last 72 hours. Lipid Profile: Recent Labs    12/05/20 0435  CHOL 227*  HDL 48  LDLCALC 156*  TRIG 114  CHOLHDL 4.7   Anemia Panel: No results for input(s): VITAMINB12, FOLATE, FERRITIN, TIBC, IRON, RETICCTPCT in the last 72 hours. Urine analysis:    Component Value Date/Time   COLORURINE YELLOW 11/16/2020  [place] 11/16/2020 2246   LABSPEC 1.025 11/16/2020 2246   PHURINE 6.5 11/16/2020 [place] 11/16/2020 2246   HGBUR TRACE (A) 11/16/2020 [place] 11/16/2020 2246   BILIRUBINUR neg 06/06/2019 1413   [place] 11/16/2020 2246   PROTEINUR NEGATIVE 11/16/2020 2246   UROBILINOGEN 0.2 06/06/2019 1413   UROBILINOGEN 1.0 12/27/2012 1025   NITRITE NEGATIVE 11/16/2020 2246   LEUKOCYTESUR SMALL (A) 11/16/2020 2246   Sepsis Labs: Invalid input(s): PROCALCITONIN, LACTICIDVEN   Time coordinating discharge: 50 minutes  SIGNED:  Mercy Riding, MD  [facility] 12/06/2020, 3:39 PM

## 2020-12-06 NOTE — Progress Notes (Signed)
STROKE TEAM PROGRESS NOTE   INTERVAL HISTORY She is sitting up in bed in NAD. She reports her symptoms have   improved, Left side weakness, numbness and tingling has resolved, however still with dizziness with sitting up, blurry vision and intermittent headache.  MRI shows tiny right occipital and thalamic infarct and MR angiogram shows artifact from aneurysm coil but no recurrent aneurysm.  Echo showed normal ejection fraction.  LDL cholesterol is 156 mg percent and hemoglobin A1c is 5.4.  Vitals:   12/06/20 0557 12/06/20 0558 12/06/20 0750 12/06/20 0807  BP: 117/78 (!) 114/93 (!) 126/94   Pulse: 71 77 78   Resp: 14 18 13    Temp:   97.9 F (36.6 C)   TempSrc:   Oral   SpO2: 96% 98% 97% 99%  Weight:      Height:       CBC:  Recent Labs  Lab 12/04/20 1423 12/04/20 1431 12/06/20 0044  WBC 5.3  --  6.0  NEUTROABS 2.5  --   --   HGB 13.2 13.3 13.8  HCT 39.9 39.0 40.8  MCV 91.3  --  90.1  PLT 233  --  562   Basic Metabolic Panel:  Recent Labs  Lab 12/04/20 1423 12/04/20 1431 12/06/20 0044  NA 139 141 139  K 3.4* 3.4* 3.5  CL 107 105 105  CO2 23  --  25  GLUCOSE 120* 123* 139*  BUN 9 10 14   CREATININE 0.64 0.60 0.72  CALCIUM 8.6*  --  9.0  MG  --   --  2.0  PHOS  --   --  4.8*   Lipid Panel:  Recent Labs  Lab 12/05/20 0435  CHOL 227*  TRIG 114  HDL 48  CHOLHDL 4.7  VLDL 23  LDLCALC 156*   HgbA1c:  Recent Labs  Lab 12/05/20 0434  HGBA1C 5.4   Urine Drug Screen: No results for input(s): LABOPIA, COCAINSCRNUR, LABBENZ, AMPHETMU, THCU, LABBARB in the last 168 hours.  Alcohol Level No results for input(s): ETH in the last 168 hours.  IMAGING past 24 hours No results found.  PHYSICAL EXAM  Temp:  [97.9 F (36.6 C)-99.2 F (37.3 C)] 97.9 F (36.6 C) (11/07 0750) Pulse Rate:  [70-79] 78 (11/07 0750) Resp:  [13-19] 13 (11/07 0750) BP: (114-144)/(76-96) 126/94 (11/07 0750) SpO2:  [96 %-99 %] 99 % (11/07 0807)  General - Well nourished, well developed,  in no apparent distress.  Ophthalmologic - fundi not visualized due to noncooperation.  Cardiovascular - Regular rhythm and rate.  Mental Status -  Level of arousal and orientation to time, place, and person were intact. Language including expression, naming, repetition, comprehension was assessed and found intact. Attention span and concentration were normal. Recent and remote memory were intact. Fund of Knowledge was assessed and was intact.  Cranial Nerves II - XII - II - Visual field intact OU, notes no visual field defect.. III, IV, VI - Extraocular movements intact. V - Facial sensation intact bilaterally. VII - Facial movement intact bilaterally. VIII - Hearing & vestibular intact bilaterally. X - Palate elevates symmetrically. XI - Chin turning & shoulder shrug intact bilaterally. XII - Tongue protrusion intact.  Motor Strength - The patient's strength was normal in all extremities and pronator drift was absent.  Bulk was normal and fasciculations were absent.   Motor Tone - Muscle tone was assessed at the neck and appendages and was normal.  Sensory - Light touch, temperature/pinprick were assessed and were  symmetrical.    Coordination - The patient had normal movements in the hands and feet with no ataxia or dysmetria.  Tremor was absent.  Gait and Station - deferred.   ASSESSMENT/PLAN [name] is a 60 y.o. 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  7 days ago, patient started to have left-sided weakness and numbness as well as bilateral blurry vision.  She called neurosurgery's office, and had a MRI done 2 days ago.   On day of admission patient woke up with worsening of blurry vision and persistent left-sided weakness and came to ED.  Left-sided numbness/improved upon arrival in  ED.  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 ASPECTS 10.    CTA head & neck 1. No large vessel occlusion. 2. Basilar tip aneurysm coiling without evidence of recurrent aneurysm filling. 3. Suboptimal assessment of the proximal PCAs and right ACA due to streak artifact. 4. Widely patent cervical carotid and vertebral arteries.  MRI  1. Small foci of acute ischemia in the right thalamus and right occipital lobe, both within the right PCA territory. 2. Coil mass near the tip of the basilar artery. No residual aneurysm filling identified. 2D Echo EF 60-65%. No atrial shunt LDL 156 HgbA1c 5.4 VTE prophylaxis - Lovenox/ SCD's    Diet   Diet Heart Room service appropriate? Yes; Fluid consistency: Thin   aspirin 81 mg daily and Brilinta (ticagrelor) 90 mg bid prior to admission, now on aspirin 81 mg daily and Brilinta (ticagrelor) 90 mg bid.  Therapy recommendations: No need  disposition: Home Hypertension Home meds:  none Stable Permissive hypertension up to <days Long-term BP goal normotensive  Hyperlipidemia Home meds:  Praluent, not resumed in hospital LDL 156, goal < 70 Add atorvastatin 80mg    Continue statin at discharge I  Other Stroke Risk Factors Migraines  Other Active Problems Hypothyroidism Asthma  Hospital day # 2    Pt with recent aneurysm coiling. MRI with new right occipital and thalamic stroke. Taking aspirin and brilinta at home.  Recommend continue dual antiplatelet therapy and aggressive risk factor modification.  Patient advised not to drive till the blurred vision improves.  Follow-up with Dr. Kathyrn Sheriff aneurysm follow-up. Follow-up as an outpatient stroke clinic in 2 months.  Greater than 50% time during this 25-minute visit was spent in counseling and coordination of care and discussion with care  team and answering questions. Antony Contras, MD  To contact Stroke Continuity provider, please refer to [email or web address] After hours, contact General Neurology

## 2020-12-06 NOTE — Progress Notes (Signed)
Discharge instructions (including medications) discussed with and copy provided to patient/caregiver 

## 2020-12-06 NOTE — Progress Notes (Signed)
  NEUROSURGERY PROGRESS NOTE   No issues overnight. History reviewed with pt and in EMR. Does cont to c/o some blurry vision, no further numbness/tingling or weakness.  EXAM:  BP (!) 126/94 (BP Location: Right Arm)   Pulse 78   Temp 97.9 F (36.6 C) (Oral)   Resp 13   Ht 5\' 6"  (1.676 m)   Wt 84.7 kg   LMP 01/31/2007   SpO2 99%   BMI 30.14 kg/m   Awake, alert, oriented  Speech fluent, appropriate  CN grossly intact  5/5 BUE/BLE   IMPRESSION:  60 y.o. female s/p basilar artery stent-coil with likely stent-related small thalamic/occipital infarct. Appears to be stable neurologically over last 48 hrs.  PLAN: - Appears to be stable for d/c from neurosurgical standpoint - Cont daily ASA/Brilinta - f/u in my office next Monday, 11/14   Consuella Lose, MD [facility]

## 2020-12-09 ENCOUNTER — Telehealth: Payer: Self-pay

## 2020-12-09 NOTE — Telephone Encounter (Signed)
Transition Care Management Unsuccessful Follow-up Telephone Call  Date of discharge and from where:  12/06/2020 / [name]  Attempts:  1st Attempt  Reason for unsuccessful TCM follow-up call:  HIPAA compliant. Left voice message  Quinn Plowman RN,BSN,CCM RN Case Manager [place].  [phone]

## 2020-12-15 ENCOUNTER — Telehealth: Payer: Self-pay

## 2020-12-15 NOTE — Telephone Encounter (Signed)
Transition Care Management Unsuccessful Follow-up Telephone Call  Date of discharge and from where:  12/06/2020  [name]  Attempts:  2nd Attempt  Reason for unsuccessful TCM follow-up call:  No answer/busy [name], RN, BSN, CEN [place] Coordinator [phone]

## 2020-12-16 ENCOUNTER — Emergency Department (HOSPITAL_COMMUNITY): Payer: BC Managed Care – PPO

## 2020-12-16 ENCOUNTER — Observation Stay (HOSPITAL_COMMUNITY)
Admission: EM | Admit: 2020-12-16 | Discharge: 2020-12-18 | Disposition: A | Discharge status: Home / Self Care | Payer: BC Managed Care – PPO | Attending: Internal Medicine | Admitting: Internal Medicine

## 2020-12-16 ENCOUNTER — Encounter (HOSPITAL_COMMUNITY): Payer: Self-pay | Admitting: Emergency Medicine

## 2020-12-16 ENCOUNTER — Other Ambulatory Visit: Payer: Self-pay

## 2020-12-16 DIAGNOSIS — R42 Dizziness and giddiness: Secondary | ICD-10-CM | POA: Diagnosis present

## 2020-12-16 DIAGNOSIS — Z79899 Other long term (current) drug therapy: Secondary | ICD-10-CM | POA: Diagnosis not present

## 2020-12-16 DIAGNOSIS — R319 Hematuria, unspecified: Secondary | ICD-10-CM | POA: Insufficient documentation

## 2020-12-16 DIAGNOSIS — J45909 Unspecified asthma, uncomplicated: Secondary | ICD-10-CM | POA: Diagnosis not present

## 2020-12-16 DIAGNOSIS — Z7982 Long term (current) use of aspirin: Secondary | ICD-10-CM | POA: Diagnosis not present

## 2020-12-16 DIAGNOSIS — Z8673 Personal history of transient ischemic attack (TIA), and cerebral infarction without residual deficits: Secondary | ICD-10-CM | POA: Diagnosis not present

## 2020-12-16 DIAGNOSIS — Y900 Blood alcohol level of less than 20 mg/100 ml: Secondary | ICD-10-CM | POA: Insufficient documentation

## 2020-12-16 DIAGNOSIS — E039 Hypothyroidism, unspecified: Secondary | ICD-10-CM | POA: Diagnosis present

## 2020-12-16 DIAGNOSIS — Z20822 Contact with and (suspected) exposure to covid-19: Secondary | ICD-10-CM | POA: Diagnosis not present

## 2020-12-16 DIAGNOSIS — I639 Cerebral infarction, unspecified: Principal | ICD-10-CM | POA: Diagnosis present

## 2020-12-16 DIAGNOSIS — G459 Transient cerebral ischemic attack, unspecified: Secondary | ICD-10-CM | POA: Diagnosis not present

## 2020-12-16 DIAGNOSIS — H538 Other visual disturbances: Secondary | ICD-10-CM | POA: Diagnosis not present

## 2020-12-16 DIAGNOSIS — E785 Hyperlipidemia, unspecified: Secondary | ICD-10-CM | POA: Diagnosis present

## 2020-12-16 DIAGNOSIS — E876 Hypokalemia: Secondary | ICD-10-CM | POA: Diagnosis present

## 2020-12-16 DIAGNOSIS — I10 Essential (primary) hypertension: Secondary | ICD-10-CM | POA: Diagnosis present

## 2020-12-16 DIAGNOSIS — J4540 Moderate persistent asthma, uncomplicated: Secondary | ICD-10-CM | POA: Diagnosis present

## 2020-12-16 HISTORY — DX: Cerebral infarction, unspecified: I63.9

## 2020-12-16 LAB — CBC
HCT: 38.5 % (ref 36.0–46.0)
Hemoglobin: 13 g/dL (ref 12.0–15.0)
MCH: 30.7 pg (ref 26.0–34.0)
MCHC: 33.8 g/dL (ref 30.0–36.0)
MCV: 91 fL (ref 80.0–100.0)
Platelets: 213 10*3/uL (ref 150–400)
RBC: 4.23 MIL/uL (ref 3.87–5.11)
RDW: 12.6 % (ref 11.5–15.5)
WBC: 6.7 10*3/uL (ref 4.0–10.5)
nRBC: 0 % (ref 0.0–0.2)

## 2020-12-16 LAB — I-STAT CHEM 8, ED
BUN: 17 mg/dL (ref 6–20)
Calcium, Ion: 1.17 mmol/L (ref 1.15–1.40)
Chloride: 106 mmol/L (ref 98–111)
Creatinine, Ser: 0.7 mg/dL (ref 0.44–1.00)
Glucose, Bld: 116 mg/dL — ABNORMAL HIGH (ref 70–99)
HCT: 37 % (ref 36.0–46.0)
Hemoglobin: 12.6 g/dL (ref 12.0–15.0)
Potassium: 3.4 mmol/L — ABNORMAL LOW (ref 3.5–5.1)
Sodium: 144 mmol/L (ref 135–145)
TCO2: 25 mmol/L (ref 22–32)

## 2020-12-16 LAB — COMPREHENSIVE METABOLIC PANEL
ALT: 27 U/L (ref 0–44)
AST: 29 U/L (ref 15–41)
Albumin: 3.6 g/dL (ref 3.5–5.0)
Alkaline Phosphatase: 63 U/L (ref 38–126)
Anion gap: 9 (ref 5–15)
BUN: 15 mg/dL (ref 6–20)
CO2: 24 mmol/L (ref 22–32)
Calcium: 8.7 mg/dL — ABNORMAL LOW (ref 8.9–10.3)
Chloride: 106 mmol/L (ref 98–111)
Creatinine, Ser: 0.78 mg/dL (ref 0.44–1.00)
GFR, Estimated: >60 mL/min (ref >60)
Glucose, Bld: 118 mg/dL — ABNORMAL HIGH (ref 70–99)
Potassium: 3.4 mmol/L — ABNORMAL LOW (ref 3.5–5.1)
Sodium: 139 mmol/L (ref 135–145)
Total Bilirubin: 0.5 mg/dL (ref 0.3–1.2)
Total Protein: 6.6 g/dL (ref 6.5–8.1)

## 2020-12-16 LAB — ETHANOL: Alcohol, Ethyl (B): <10 mg/dL (ref <10)

## 2020-12-16 LAB — I-STAT BETA HCG BLOOD, ED (MC, WL, AP ONLY): I-stat hCG, quantitative: <5 m[IU]/mL (ref <5)

## 2020-12-16 LAB — DIFFERENTIAL
Abs Immature Granulocytes: 0.02 10*3/uL (ref 0.00–0.07)
Basophils Absolute: 0.1 10*3/uL (ref 0.0–0.1)
Basophils Relative: 1 %
Eosinophils Absolute: 0.3 10*3/uL (ref 0.0–0.5)
Eosinophils Relative: 4 %
Immature Granulocytes: 0 %
Lymphocytes Relative: 33 %
Lymphs Abs: 2.2 10*3/uL (ref 0.7–4.0)
Monocytes Absolute: 0.5 10*3/uL (ref 0.1–1.0)
Monocytes Relative: 7 %
Neutro Abs: 3.7 10*3/uL (ref 1.7–7.7)
Neutrophils Relative %: 55 %

## 2020-12-16 LAB — PROTIME-INR
INR: 0.9 (ref 0.8–1.2)
Prothrombin Time: 12.3 seconds (ref 11.4–15.2)

## 2020-12-16 LAB — CBG MONITORING, ED: Glucose-Capillary: 126 mg/dL — ABNORMAL HIGH (ref 70–99)

## 2020-12-16 LAB — APTT: aPTT: 27 seconds (ref 24–36)

## 2020-12-16 MED ORDER — SODIUM CHLORIDE 0.9% FLUSH: 3.0000 mL | Freq: Once | INTRAVENOUS | Status: DC

## 2020-12-16 MED ORDER — SODIUM CHLORIDE 0.9 % IV SOLN
25.0000 mg | Freq: Four times a day (QID) | INTRAVENOUS | Status: DC | PRN
  Administered 2020-12-17: 25 mg via INTRAVENOUS
  Filled 2020-12-16: qty 1

## 2020-12-16 MED ORDER — DIPHENHYDRAMINE HCL 50 MG/ML IJ SOLN: 25.0000 mg | Freq: Once | INTRAMUSCULAR | Status: DC

## 2020-12-16 MED ORDER — PROCHLORPERAZINE EDISYLATE 10 MG/2ML IJ SOLN: 10.0000 mg | Freq: Once | INTRAMUSCULAR | Status: DC

## 2020-12-16 NOTE — ED Provider Notes (Signed)
[facility] EMERGENCY DEPARTMENT Provider Note   CSN: [id number] Arrival date & time: 12/16/20  2240     History Chief Complaint  Patient presents with   Code Stroke    Tiffany Horn is a 60 y.o. female.  60 yo F with a chief complaints of acute onsets of dizziness blurred vision and left-sided weakness.  Unfortunately this is not the first time this is happened to this patient and last time she was told she had a couple strokes ended up coming into the hospital.  She had got home from work and had it suddenly happened to her about 8 PM.  She denies any head injury denies loss of consciousness is having a bit of a headache which happened last time as well.  Denies any change in her medicines.  Has been recently approved to go back to work by the interventional neurology team.  The history is provided by the patient and the EMS personnel.  Illness Severity:  Moderate Onset quality:  Sudden Duration:  2 hours Timing:  Constant Progression:  Unchanged Chronicity:  Recurrent Associated symptoms: headaches   Associated symptoms: no chest pain, no congestion, no fever, no myalgias, no nausea, no rhinorrhea, no shortness of breath, no vomiting and no wheezing       Past Medical History:  Diagnosis Date   Allergy    Anxiety    Arthritis    knees and spine, shoulder   Asthma    Bronchitis    hx - recurrent   Complication of anesthesia    waking up is not easy   Depression    Elevated IgE level 09/12/2017   09/12/2017 IgE 195   H/O miscarriage, not currently pregnant    Hx of irritable bowel syndrome    x2   Hyperlipidemia    diet controlled - no medication   Hypertension    not taking any meds at present - under control per patient   Hypokalemia    with PNA admission (2.5)   Migraines    Pneumonia    4 episodes; hosp. admission 2014   PONV (postoperative nausea and vomiting)    Thyroid disease    UC (ulcerative colitis) [facility])    DX'D 2021     Patient Active Problem List   Diagnosis Date Noted   Acute CVA (cerebrovascular accident) ([place]) 12/04/2020   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[place]) 11/17/2020   Family history of heart disease 09/03/2019   Mild intermittent asthma 07/25/2019   Osteoporosis 06/12/2019   Elevated LFTs 06/06/2019   Hematuria 06/06/2019   Elevated IgE level 09/12/2017   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 [facility]-2017 LAST   DILATION AND CURETTAGE OF UTERUS     IR ANGIO INTRA EXTRACRAN SEL INTERNAL CAROTID BILAT MOD SED  11/19/2020   IR ANGIO VERTEBRAL SEL VERTEBRAL BILAT MOD SED  11/19/2020   IR ANGIOGRAM FOLLOW UP STUDY  11/19/2020   IR ANGIOGRAM FOLLOW UP STUDY  11/19/2020   IR ANGIOGRAM FOLLOW UP STUDY  11/19/2020  IR ANGIOGRAM FOLLOW UP STUDY  11/19/2020   IR ANGIOGRAM FOLLOW UP STUDY  11/19/2020   IR ANGIOGRAM FOLLOW UP STUDY  11/19/2020   IR ANGIOGRAM FOLLOW UP STUDY  11/19/2020   IR ANGIOGRAM FOLLOW UP STUDY  11/19/2020   IR INTRA CRAN STENT  11/19/2020   IR TRANSCATH/EMBOLIZ  11/19/2020   LAPAROSCOPIC ABDOMINAL EXPLORATION  01/31/1992   endometriosis   ORIF HUMERUS FRACTURE Left 04/01/2013   DR Ninfa Linden - shoulder   ORIF HUMERUS FRACTURE Left 04/01/2013   Procedure: OPEN REDUCTION INTERNAL FIXATION (ORIF) LEFT PROXIMAL HUMERUS FRACTURE;  Surgeon: Mcarthur Rossetti, MD;  Location: [facility];  Service: Orthopedics;  Laterality: Left;   RADIOLOGY WITH ANESTHESIA N/A 11/19/2020   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;   Surgeon: Consuella Lose, MD;  Location: [facility];  Service: Radiology;  Laterality: N/A;   TONSILLECTOMY AND ADENOIDECTOMY       OB History     Gravida  2   Para  0   Term  0   Preterm  0   AB  2   Living  0      SAB  2   IAB      Ectopic      Multiple      Live Births              Family History  Problem Relation Age of Onset   Breast cancer Mother    Diabetes Mother    Heart disease Mother    Diabetes Father    Dementia Father    Colon cancer Father    Stroke Paternal Grandmother    Diabetes Paternal Grandmother    Colon polyps Neg Hx    Esophageal cancer Neg Hx    Stomach cancer Neg Hx    Rectal cancer Neg Hx     Social History   Tobacco Use   Smoking status: Never   Smokeless tobacco: Never  Vaping Use   Vaping Use: Never used  Substance Use Topics   Alcohol use: Yes    Alcohol/week: 2.0 standard drinks    Types: 2 Glasses of wine per week    Comment: OCC / SOCIAL  MAYBE 2 A WEEK   Drug use: No    Home Medications Prior to Admission medications   Medication Sig Start Date End Date Taking? Authorizing Provider  albuterol (VENTOLIN HFA) 108 (90 Base) MCG/ACT inhaler Inhale 2 puffs into the lungs every 6 (six) hours as needed for wheezing or shortness of breath. 05/21/19   Kuneff, Renee A, DO  amLODipine (NORVASC) 10 MG tablet Take 1 tablet (10 mg total) by mouth daily. 12/06/20   Mercy Riding, MD  Ascorbic Acid (VITAMIN C) 1000 MG tablet Take 1,000 mg by mouth daily.    [provider]  aspirin 81 MG chewable tablet Chew 1 tablet (81 mg total) by mouth daily. 11/22/20   Consuella Lose, MD  atorvastatin (LIPITOR) 80 MG tablet Take 1 tablet (80 mg total) by mouth daily. 12/07/20   Mercy Riding, MD  B Complex Vitamins (B COMPLEX PO) Take 1 tablet by mouth daily.    [provider]  Budeson-Glycopyrrol-Formoterol (BREZTRI AEROSPHERE) 160-9-4.8 MCG/ACT AERO Inhale 2 puffs into the lungs in the morning and at bedtime.  04/28/20   Icard, Octavio Graves, DO  clonazePAM (KLONOPIN) 0.5 MG tablet Take 1 tablet (0.5 mg total) by mouth 3 (three) times daily as needed for anxiety. Patient taking differently: Take 0.5 mg  by mouth daily as needed for anxiety. 04/23/20   Addison Lank, PA-C  levothyroxine (SYNTHROID) 112 MCG tablet Take 1 tablet (112 mcg total) by mouth daily. 11/25/20   Kuneff, Renee A, DO  MAGNESIUM GLYCINATE PO Take 1 tablet by mouth at bedtime.    [provider]  PARoxetine (PAXIL) 40 MG tablet Take 1 tablet (40 mg total) by mouth daily. 04/23/20   Donnal Moat T, PA-C  rizatriptan (MAXALT) 5 MG tablet Take 1 tablet (5 mg total) by mouth daily as needed for migraine. 11/25/20   Howard Pouch A, DO  Spacer/Aero-Holding Dorise Bullion Use daily with inhaler 04/28/20   Icard, Octavio Graves, DO  ticagrelor (BRILINTA) 90 MG TABS tablet Take 1 tablet (90 mg total) by mouth 2 (two) times daily. 11/22/20   Consuella Lose, MD    Allergies    Abilify [aripiprazole], Amoxicillin, and Seroquel [quetiapine fumarate]  Review of Systems   Review of Systems  Constitutional:  Negative for chills and fever.  HENT:  Negative for congestion and rhinorrhea.   Eyes:  Positive for visual disturbance. Negative for redness.  Respiratory:  Negative for shortness of breath and wheezing.   Cardiovascular:  Negative for chest pain and palpitations.  Gastrointestinal:  Negative for nausea and vomiting.  Genitourinary:  Negative for dysuria and urgency.  Musculoskeletal:  Negative for arthralgias and myalgias.  Skin:  Negative for pallor and wound.  Neurological:  Positive for dizziness and headaches.   Physical Exam Updated Vital Signs BP (!) 154/101 (BP Location: Left Arm)   Pulse 72   Temp 98 F (36.7 C) (Oral)   Resp 16   Ht 5\' 6"  (8.841 m)   Wt 77.1 kg   LMP 01/31/2007   SpO2 98%   BMI 27.44 kg/m   Physical Exam Vitals and nursing note reviewed.  Constitutional:      General: She is not in acute  distress.    Appearance: She is well-developed. She is not diaphoretic.  HENT:     Head: Normocephalic and atraumatic.  Eyes:     Pupils: Pupils are equal, round, and reactive to light.  Cardiovascular:     Rate and Rhythm: Normal rate and regular rhythm.     Heart sounds: No murmur heard.   No friction rub. No gallop.  Pulmonary:     Effort: Pulmonary effort is normal.     Breath sounds: No wheezing or rales.  Abdominal:     General: There is no distension.     Palpations: Abdomen is soft.     Tenderness: There is no abdominal tenderness.  Musculoskeletal:        General: No tenderness.     Cervical back: Normal range of motion and neck supple.  Skin:    General: Skin is warm and dry.  Neurological:     Mental Status: She is alert and oriented to person, place, and time.     GCS: GCS eye subscore is 4. GCS verbal subscore is 5. GCS motor subscore is 6.     Cranial Nerves: Cranial nerves 2-12 are intact.     Sensory: Sensation is intact.     Motor: Motor function is intact.     Coordination: Coordination is intact.  Psychiatric:        Behavior: Behavior normal.    ED Results / Procedures / Treatments   Labs (all labs ordered are listed, but only abnormal results are displayed) Labs Reviewed  CBG MONITORING, ED - Abnormal; Notable  for the following components:      Result Value   Glucose-Capillary 126 (*)    All other components within normal limits  RESP PANEL BY RT-PCR (FLU A&B, COVID) ARPGX2  ETHANOL  PROTIME-INR  APTT  CBC  DIFFERENTIAL  COMPREHENSIVE METABOLIC PANEL  RAPID URINE DRUG SCREEN, HOSP PERFORMED  URINALYSIS, ROUTINE W REFLEX MICROSCOPIC  PROTIME-INR  APTT  CBC  DIFFERENTIAL  COMPREHENSIVE METABOLIC PANEL  I-STAT CHEM 8, ED  I-STAT BETA HCG BLOOD, ED ([facility], [facility], [facility] ONLY)  I-STAT CHEM 8, ED  CBG MONITORING, ED  I-STAT BETA HCG BLOOD, ED ([facility], [facility], [facility] ONLY)    EKG None  Radiology No results found.  Procedures Procedures   Medications  Ordered in ED Medications  prochlorperazine (COMPAZINE) injection 10 mg (has no administration in time range)  diphenhydrAMINE (BENADRYL) injection 25 mg (has no administration in time range)  sodium chloride flush (NS) 0.9 % injection 3 mL (has no administration in time range)    ED Course  I have reviewed the triage vital signs and the nursing notes.  Pertinent labs & imaging results that were available during my care of the patient were reviewed by me and considered in my medical decision making (see chart for details).    MDM Rules/Calculators/A&P                           60 yo F with acute onset blurred vision headache and dizziness and left arm weakness.  The patient had the exact same symptoms last time she was seen in the hospital and found to have a possible new stroke versus stroke from her prior coiling.  With her having acute onsets of symptoms will make her a code stroke.  Signed out to Dr. Leonette Monarch, please see their note for further details of care in the ED.  The patients results and plan were reviewed and discussed.   Any x-rays performed were independently reviewed by myself.   Differential diagnosis were considered with the presenting HPI.  Medications  prochlorperazine (COMPAZINE) injection 10 mg (has no administration in time range)  diphenhydrAMINE (BENADRYL) injection 25 mg (has no administration in time range)  sodium chloride flush (NS) 0.9 % injection 3 mL (has no administration in time range)    Vitals:   12/16/20 2246 12/16/20 2253 12/16/20 2254  BP:  (!) 154/101   Pulse:  72   Resp:  16   Temp:  98 F (36.7 C)   TempSrc:  Oral   SpO2: 96% 98%   Weight:   77.1 kg  Height:   5\' 6"  (1.676 m)    Final diagnoses:  Dizziness  Blurred vision    Admission/ observation were discussed with the admitting physician, patient and/or family and they are comfortable with the plan.   Final Clinical Impression(s) / ED Diagnoses Final diagnoses:   Dizziness  Blurred vision    Rx / DC Orders ED Discharge Orders     None        Deno Etienne, DO 12/16/20 2315

## 2020-12-16 NOTE — ED Triage Notes (Signed)
Pt arrives from home via [facility] reporting new blurred vision, dizziness, headache, LSN 2000.  MD made aware.  Pt has recent hx of neurosurgery and CVAx2. AOx4 on arrival.

## 2020-12-16 NOTE — Code Documentation (Signed)
Stroke Response Nurse Documentation Code Documentation  Tiffany Horn is a 60 y.o. female arriving to [facility] ED via [facility] on 11/17 with past medical hx of cva x2, aneurysm coiling/stent. On No antithrombotic. Code stroke was activated by ED.   Patient from home where she was LKW at 2000 and now complaining of blurred vision and dizzyness.   Stroke team at the bedside on patient arrival. Labs drawn and patient cleared for CT by Dr. Tyrone Nine. Patient to CT with team. NIHSS 0, see documentation for details and code stroke times. Patient with no deficit on exam. The following imaging was completed:  CT. Patient is not a candidate for IV Thrombolytic due to no fixed deficit. Patient is not a candidate for IR due to No LVO.   Care/Plan: MRI.   Bedside handoff with ED RN [place].    Madelynn Done  Rapid Response RN

## 2020-12-16 NOTE — Consult Note (Addendum)
NEURO HOSPITALIST CONSULT NOTE   Requestig physician: Dr. Tyrone Nine  Reason for Consult: Acute onset of vertigo with nausea  History obtained from:   Patient and Chart     HPI:                                                                                                                                          Tiffany Horn is an 60 y.o. female recently discharged after aneurysm coiling in addition to stent placement by Dr. Kathyrn Sheriff, 2 perioperative strokes diagnosed by MRI 3 days after the procedure, anxiety, asthma, depression, prior miscarriage, HLD, HTN, migraines and ulcerative colitis, presenting to the ED as a Code Stroke with acute onset of vertigo, blurred vision and left sided weakness.   Symptom onset was today after work. LKN 2000.  She had got home from work and the above symptoms suddenly started at about 8 PM.  No head injury or LOC. Does have a bit of a headache which happened last time she was seen for similar symptoms about 2 weeks ago as well. Was recently approved to go back to work by the interventional neurology team. She has had some dizziness. She did have double vision with her initial presentation that led to the diagnosis of the aneurysm, but has not had double vision with this episode.   Home medications include ASA, Brilinta and atorvastatin.   Past Medical History:  Diagnosis Date   Allergy    Anxiety    Arthritis    knees and spine, shoulder   Asthma    Bronchitis    hx - recurrent   Complication of anesthesia    waking up is not easy   Depression    Elevated IgE level 09/12/2017   09/12/2017 IgE 195   H/O miscarriage, not currently pregnant    Hx of irritable bowel syndrome    x2   Hyperlipidemia    diet controlled - no medication   Hypertension    not taking any meds at present - under control per patient   Hypokalemia    with PNA admission (2.5)   Migraines    Pneumonia    4 episodes; hosp. admission 2014   PONV  (postoperative nausea and vomiting)    Thyroid disease    UC (ulcerative colitis) [facility])    DX'D 2021    Past Surgical History:  Procedure Laterality Date   BREAST SURGERY     implants, then had them removed   COLONOSCOPY     greater 10 yrs ago - ? [facility]-2017 LAST   DILATION AND CURETTAGE OF UTERUS     IR ANGIO INTRA EXTRACRAN SEL INTERNAL CAROTID BILAT MOD SED  11/19/2020   IR ANGIO VERTEBRAL SEL VERTEBRAL BILAT MOD SED  11/19/2020  IR ANGIOGRAM FOLLOW UP STUDY  11/19/2020   IR ANGIOGRAM FOLLOW UP STUDY  11/19/2020   IR ANGIOGRAM FOLLOW UP STUDY  11/19/2020   IR ANGIOGRAM FOLLOW UP STUDY  11/19/2020   IR ANGIOGRAM FOLLOW UP STUDY  11/19/2020   IR ANGIOGRAM FOLLOW UP STUDY  11/19/2020   IR ANGIOGRAM FOLLOW UP STUDY  11/19/2020   IR ANGIOGRAM FOLLOW UP STUDY  11/19/2020   IR INTRA CRAN STENT  11/19/2020   IR TRANSCATH/EMBOLIZ  11/19/2020   LAPAROSCOPIC ABDOMINAL EXPLORATION  01/31/1992   endometriosis   ORIF HUMERUS FRACTURE Left 04/01/2013   DR Ninfa Linden - shoulder   ORIF HUMERUS FRACTURE Left 04/01/2013   Procedure: OPEN REDUCTION INTERNAL FIXATION (ORIF) LEFT PROXIMAL HUMERUS FRACTURE;  Surgeon: Mcarthur Rossetti, MD;  Location: [facility];  Service: Orthopedics;  Laterality: Left;   RADIOLOGY WITH ANESTHESIA N/A 11/19/2020   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;  Surgeon: Consuella Lose, MD;  Location: [place];  Service: Radiology;  Laterality: N/A;   TONSILLECTOMY AND ADENOIDECTOMY      Family History  Problem Relation Age of Onset   Breast cancer Mother    Diabetes Mother    Heart disease Mother    Diabetes Father    Dementia Father    Colon cancer Father    Stroke Paternal Grandmother    Diabetes Paternal Grandmother    Colon polyps Neg Hx    Esophageal cancer Neg Hx    Stomach cancer Neg Hx    Rectal cancer Neg Hx             Social History:  reports that she has never smoked. She has never used smokeless tobacco. She reports  current alcohol use of about 2.0 standard drinks per week. She reports that she does not use drugs.  Allergies  Allergen Reactions   Abilify [Aripiprazole] Nausea And Vomiting   Amoxicillin Diarrhea   Seroquel [Quetiapine Fumarate]     Unknown    MEDICATIONS:                                                                                                                      Current Facility-Administered Medications on File Prior to Encounter  Medication Dose Route Frequency Provider Last Rate Last Admin   0.9 %  sodium chloride infusion  500 mL Intravenous Once Nandigam, Venia Minks, MD       Current Outpatient Medications on File Prior to Encounter  Medication Sig Dispense Refill   albuterol (VENTOLIN HFA) 108 (90 Base) MCG/ACT inhaler Inhale 2 puffs into the lungs every 6 (six) hours as needed for wheezing or shortness of breath. 6.7 g 5   amLODipine (NORVASC) 10 MG tablet Take 1 tablet (10 mg total) by mouth daily. 90 tablet 1   Ascorbic Acid (VITAMIN C) 1000 MG tablet Take 1,000 mg by mouth daily.     aspirin 81 MG chewable tablet Chew 1 tablet (81 mg total) by mouth daily. 30 tablet 3  atorvastatin (LIPITOR) 80 MG tablet Take 1 tablet (80 mg total) by mouth daily. 90 tablet 1   B Complex Vitamins (B COMPLEX PO) Take 1 tablet by mouth daily.     Budeson-Glycopyrrol-Formoterol (BREZTRI AEROSPHERE) 160-9-4.8 MCG/ACT AERO Inhale 2 puffs into the lungs in the morning and at bedtime. 5.9 g 1   clonazePAM (KLONOPIN) 0.5 MG tablet Take 1 tablet (0.5 mg total) by mouth 3 (three) times daily as needed for anxiety. (Patient taking differently: Take 0.5 mg by mouth daily as needed for anxiety.) 90 tablet 5   levothyroxine (SYNTHROID) 112 MCG tablet Take 1 tablet (112 mcg total) by mouth daily. 30 tablet 0   MAGNESIUM GLYCINATE PO Take 1 tablet by mouth at bedtime.     PARoxetine (PAXIL) 40 MG tablet Take 1 tablet (40 mg total) by mouth daily. 90 tablet 1   rizatriptan (MAXALT) 5 MG tablet Take 1  tablet (5 mg total) by mouth daily as needed for migraine. 10 tablet 5   Spacer/Aero-Holding Chambers DEVI Use daily with inhaler 1 each 0   ticagrelor (BRILINTA) 90 MG TABS tablet Take 1 tablet (90 mg total) by mouth 2 (two) times daily. 60 tablet 3     ROS:                                                                                                                                       Has some nausea with dry heaves. Still with vertigo in CT. Denies any focal weakness or sensory numbness. Other ROS as per HPI.    Blood pressure (!) 154/101, pulse 72, temperature 98 F (36.7 C), temperature source Oral, resp. rate 16, height 5\' 6"  (1.676 m), weight 77.1 kg, last menstrual period 01/31/2007, SpO2 98 %.   General Examination:                                                                                                       Physical Exam  HEENT-  [place]/AT    Lungs- Respirations unlabored Extremities- Warm and well perfused  Neurological Examination Mental Status: Alert, fully oriented, mildly anxious affect.  Speech fluent without evidence of aphasia.  Able to follow all commands without difficulty. Cranial Nerves: II: Temporal visual fields intact with no extinction to DSS. Pupils round bilaterally and briskly reactive. Right pupil 3 mm >> 1 mm, left pupil 3 mm >> 2 mm.  III,IV, VI: No ptosis. EOMI. No nystagmus. Gaze is conjugate. No double vision.  V,VII:  Smile symmetric, facial temp sensation equal bilaterally VIII: Hearing intact to voice IX,X: Palate rises symmetrically XI: Symmetric shoulder shrug XII: Midline tongue extension Motor: Right : Upper extremity   5/5    Left:     Upper extremity   5/5  Lower extremity   5/5     Lower extremity   5/5 No pronator drift Sensory: Temp and light touch intact throughout, bilaterally. No extinction to DSS.  Deep Tendon Reflexes: 2+ and symmetric throughout Plantars: Right: downgoing   Left: downgoing Cerebellar: No ataxia with FNF  and H-S bilaterally  Gait: Deferred due to severe nausea precipitated by sitting upright on CT table   Lab Results: Basic Metabolic Panel: Recent Labs  Lab 12/16/20 2317  NA 144  K 3.4*  CL 106  GLUCOSE 116*  BUN 17  CREATININE 0.70    CBC: Recent Labs  Lab 12/16/20 2300 12/16/20 2317  WBC 6.7  --   NEUTROABS 3.7  --   HGB 13.0 12.6  HCT 38.5 37.0  MCV 91.0  --   PLT 213  --     Cardiac Enzymes: No results for input(s): CKTOTAL, CKMB, CKMBINDEX, TROPONINI in the last 168 hours.  Lipid Panel: No results for input(s): CHOL, TRIG, HDL, CHOLHDL, VLDL, LDLCALC in the last 168 hours.  Imaging: CT HEAD CODE STROKE WO CONTRAST  Result Date: 12/16/2020 CLINICAL DATA:  Code stroke.  Acute neurologic deficit EXAM: CT HEAD WITHOUT CONTRAST TECHNIQUE: Contiguous axial images were obtained from the base of the skull through the vertex without intravenous contrast. COMPARISON:  None. FINDINGS: Brain: There is no mass, hemorrhage or extra-axial collection. The size and configuration of the ventricles and extra-axial CSF spaces are normal. The brain parenchyma is normal, without evidence of acute or chronic infarction. Vascular: Coil mass at the tip of the basilar artery. Skull: The visualized skull base, calvarium and extracranial soft tissues are normal. Sinuses/Orbits: No fluid levels or advanced mucosal thickening of the visualized paranasal sinuses. No mastoid or middle ear effusion. The orbits are normal. ASPECTS [facility] Stroke Program Early CT Score) - Ganglionic level infarction (caudate, lentiform nuclei, internal capsule, insula, M1-M3 cortex): 7 - Supraganglionic infarction (M4-M6 cortex): 3 Total score (0-10 with 10 being normal): 10 IMPRESSION: 1. No acute intracranial abnormality. 2. ASPECTS is 10. These results were communicated to Dr. Kerney Elbe at 11:12 pm on 12/16/2020 by text page via the [facility] messaging system. Electronically Signed   By: Ulyses Jarred M.D.   On:  12/16/2020 23:16     Assessment: 60 year old female s/p recent aneurysm coiling with stent, peri-procedural ischemic strokes x 2, presenting acutely for recurrence of vertigo with nausea, in conjunction with blurred vision 1. Exam is nonfocal. NIHSS 0. Not a candidate for IV thrombolysis. Her symptoms are more suggestive of a labyrinthitis or other peripheral vestibulopathy than a stroke, but TIA should be considered.  2. CT head: No acute intracranial abnormality. ASPECTS is 10. Coil mass at the tip of the basilar artery is noted.  3. EKG: Sinus rhythm, Low voltage, precordial leads  Recommendations: 1. MRI brain with and without contrast.  2. Close observation with q2h neuro checks.    Electronically signed: Dr. Kerney Elbe 12/16/2020, 11:29 PM

## 2020-12-17 ENCOUNTER — Other Ambulatory Visit: Payer: Self-pay | Admitting: Cardiology

## 2020-12-17 ENCOUNTER — Observation Stay (HOSPITAL_COMMUNITY): Payer: BC Managed Care – PPO

## 2020-12-17 ENCOUNTER — Observation Stay (HOSPITAL_BASED_OUTPATIENT_CLINIC_OR_DEPARTMENT_OTHER): Payer: BC Managed Care – PPO

## 2020-12-17 ENCOUNTER — Encounter (HOSPITAL_COMMUNITY): Payer: Self-pay | Admitting: Internal Medicine

## 2020-12-17 DIAGNOSIS — J4540 Moderate persistent asthma, uncomplicated: Secondary | ICD-10-CM

## 2020-12-17 DIAGNOSIS — I10 Essential (primary) hypertension: Secondary | ICD-10-CM | POA: Diagnosis not present

## 2020-12-17 DIAGNOSIS — G459 Transient cerebral ischemic attack, unspecified: Secondary | ICD-10-CM

## 2020-12-17 DIAGNOSIS — I725 Aneurysm of other precerebral arteries: Secondary | ICD-10-CM

## 2020-12-17 DIAGNOSIS — E876 Hypokalemia: Secondary | ICD-10-CM | POA: Diagnosis not present

## 2020-12-17 DIAGNOSIS — I639 Cerebral infarction, unspecified: Secondary | ICD-10-CM

## 2020-12-17 DIAGNOSIS — E039 Hypothyroidism, unspecified: Secondary | ICD-10-CM

## 2020-12-17 DIAGNOSIS — E782 Mixed hyperlipidemia: Secondary | ICD-10-CM | POA: Diagnosis not present

## 2020-12-17 HISTORY — DX: Transient cerebral ischemic attack, unspecified: G45.9

## 2020-12-17 LAB — COMPREHENSIVE METABOLIC PANEL
ALT: 27 U/L (ref 0–44)
AST: 26 U/L (ref 15–41)
Albumin: 3.7 g/dL (ref 3.5–5.0)
Alkaline Phosphatase: 65 U/L (ref 38–126)
Anion gap: 8 (ref 5–15)
BUN: 13 mg/dL (ref 6–20)
CO2: 24 mmol/L (ref 22–32)
Calcium: 9.1 mg/dL (ref 8.9–10.3)
Chloride: 109 mmol/L (ref 98–111)
Creatinine, Ser: 0.69 mg/dL (ref 0.44–1.00)
GFR, Estimated: >60 mL/min (ref >60)
Glucose, Bld: 107 mg/dL — ABNORMAL HIGH (ref 70–99)
Potassium: 4 mmol/L (ref 3.5–5.1)
Sodium: 141 mmol/L (ref 135–145)
Total Bilirubin: 0.4 mg/dL (ref 0.3–1.2)
Total Protein: 6.7 g/dL (ref 6.5–8.1)

## 2020-12-17 LAB — URINALYSIS, ROUTINE W REFLEX MICROSCOPIC
Bacteria, UA: NONE SEEN
Bilirubin Urine: NEGATIVE
Glucose, UA: NEGATIVE mg/dL
Ketones, ur: NEGATIVE mg/dL
Nitrite: NEGATIVE
Protein, ur: NEGATIVE mg/dL
Specific Gravity, Urine: 1.011 (ref 1.005–1.030)
pH: 7 (ref 5.0–8.0)

## 2020-12-17 LAB — RESP PANEL BY RT-PCR (FLU A&B, COVID) ARPGX2
Influenza A by PCR: NEGATIVE
Influenza B by PCR: NEGATIVE
SARS Coronavirus 2 by RT PCR: NEGATIVE

## 2020-12-17 LAB — RAPID URINE DRUG SCREEN, HOSP PERFORMED
Amphetamines: NOT DETECTED
Barbiturates: NOT DETECTED
Benzodiazepines: NOT DETECTED
Cocaine: NOT DETECTED
Opiates: NOT DETECTED
Tetrahydrocannabinol: NOT DETECTED

## 2020-12-17 LAB — CBC
HCT: 41.1 % (ref 36.0–46.0)
Hemoglobin: 13.6 g/dL (ref 12.0–15.0)
MCH: 30.5 pg (ref 26.0–34.0)
MCHC: 33.1 g/dL (ref 30.0–36.0)
MCV: 92.2 fL (ref 80.0–100.0)
Platelets: 218 10*3/uL (ref 150–400)
RBC: 4.46 MIL/uL (ref 3.87–5.11)
RDW: 12.7 % (ref 11.5–15.5)
WBC: 7.1 10*3/uL (ref 4.0–10.5)
nRBC: 0 % (ref 0.0–0.2)

## 2020-12-17 LAB — MAGNESIUM: Magnesium: 2 mg/dL (ref 1.7–2.4)

## 2020-12-17 LAB — HIV ANTIBODY (ROUTINE TESTING W REFLEX): HIV Screen 4th Generation wRfx: NONREACTIVE

## 2020-12-17 MED ORDER — ACETAMINOPHEN 650 MG RE SUPP: 650.0000 mg | Freq: Four times a day (QID) | RECTAL | Status: DC | PRN

## 2020-12-17 MED ORDER — EZETIMIBE 10 MG PO TABS
10.0000 mg | ORAL_TABLET | Freq: Every day | ORAL | Status: DC
  Administered 2020-12-18: 10 mg via ORAL
  Filled 2020-12-17: qty 1

## 2020-12-17 MED ORDER — LEVOTHYROXINE SODIUM 112 MCG PO TABS
112.0000 ug | ORAL_TABLET | Freq: Every day | ORAL | Status: DC
  Administered 2020-12-17 – 2020-12-18 (×2): 112 ug via ORAL
  Filled 2020-12-17 (×2): qty 1

## 2020-12-17 MED ORDER — ATORVASTATIN CALCIUM 80 MG PO TABS
80.0000 mg | ORAL_TABLET | Freq: Every day | ORAL | Status: DC
  Administered 2020-12-17 – 2020-12-18 (×2): 80 mg via ORAL
  Filled 2020-12-17: qty 1
  Filled 2020-12-17: qty 2

## 2020-12-17 MED ORDER — LORAZEPAM 2 MG/ML IJ SOLN
0.5000 mg | Freq: Four times a day (QID) | INTRAMUSCULAR | Status: DC | PRN
  Administered 2020-12-18: 0.5 mg via INTRAVENOUS
  Filled 2020-12-17: qty 1

## 2020-12-17 MED ORDER — POTASSIUM CHLORIDE 10 MEQ/100ML IV SOLN
10.0000 meq | INTRAVENOUS | Status: AC
  Administered 2020-12-17 (×4): 10 meq via INTRAVENOUS
  Filled 2020-12-17 (×4): qty 100

## 2020-12-17 MED ORDER — PAROXETINE HCL 20 MG PO TABS
40.0000 mg | ORAL_TABLET | Freq: Every day | ORAL | Status: DC
  Administered 2020-12-18: 40 mg via ORAL
  Filled 2020-12-17 (×2): qty 2

## 2020-12-17 MED ORDER — ACETAMINOPHEN 325 MG PO TABS
650.0000 mg | ORAL_TABLET | Freq: Four times a day (QID) | ORAL | Status: DC | PRN
  Administered 2020-12-18: 650 mg via ORAL
  Filled 2020-12-17: qty 2

## 2020-12-17 MED ORDER — SODIUM CHLORIDE 0.9 % IV SOLN
INTRAVENOUS | Status: DC
Start: 2020-12-17 — End: 2020-12-17

## 2020-12-17 MED ORDER — SODIUM CHLORIDE 0.9 % IV SOLN: INTRAVENOUS | Status: DC

## 2020-12-17 MED ORDER — STROKE: EARLY STAGES OF RECOVERY BOOK: Freq: Once | Status: AC

## 2020-12-17 MED ORDER — DIPHENHYDRAMINE HCL 25 MG PO CAPS
25.0000 mg | ORAL_CAPSULE | Freq: Every evening | ORAL | Status: DC | PRN
  Administered 2020-12-17: 25 mg via ORAL
  Filled 2020-12-17: qty 1

## 2020-12-17 MED ORDER — UMECLIDINIUM BROMIDE 62.5 MCG/ACT IN AEPB
1.0000 | INHALATION_SPRAY | Freq: Every day | RESPIRATORY_TRACT | Status: DC
  Administered 2020-12-18: 1 via RESPIRATORY_TRACT
  Filled 2020-12-17: qty 7

## 2020-12-17 MED ORDER — DIPHENHYDRAMINE HCL 50 MG/ML IJ SOLN
12.5000 mg | Freq: Once | INTRAMUSCULAR | Status: AC
  Administered 2020-12-17: 12.5 mg via INTRAVENOUS
  Filled 2020-12-17: qty 1

## 2020-12-17 MED ORDER — DIAZEPAM 5 MG PO TABS
5.0000 mg | ORAL_TABLET | Freq: Once | ORAL | Status: AC
  Administered 2020-12-17: 5 mg via ORAL
  Filled 2020-12-17: qty 1

## 2020-12-17 MED ORDER — GADOBUTROL 1 MMOL/ML IV SOLN
7.0000 mL | Freq: Once | INTRAVENOUS | Status: AC | PRN
  Administered 2020-12-17: 7 mL via INTRAVENOUS

## 2020-12-17 MED ORDER — TICAGRELOR 90 MG PO TABS
90.0000 mg | ORAL_TABLET | Freq: Two times a day (BID) | ORAL | Status: DC
  Administered 2020-12-17 – 2020-12-18 (×3): 90 mg via ORAL
  Filled 2020-12-17 (×3): qty 1

## 2020-12-17 MED ORDER — BUDESON-GLYCOPYRROL-FORMOTEROL 160-9-4.8 MCG/ACT IN AERO: 2.0000 | INHALATION_SPRAY | Freq: Two times a day (BID) | RESPIRATORY_TRACT | Status: DC

## 2020-12-17 MED ORDER — MAGNESIUM OXIDE -MG SUPPLEMENT 400 (240 MG) MG PO TABS
400.0000 mg | ORAL_TABLET | Freq: Every day | ORAL | Status: DC
  Administered 2020-12-17: 400 mg via ORAL
  Filled 2020-12-17: qty 1

## 2020-12-17 MED ORDER — UMECLIDINIUM BROMIDE 62.5 MCG/ACT IN AEPB: 1.0000 | INHALATION_SPRAY | Freq: Every day | RESPIRATORY_TRACT | Status: DC

## 2020-12-17 MED ORDER — FLUTICASONE FUROATE-VILANTEROL 100-25 MCG/ACT IN AEPB
1.0000 | INHALATION_SPRAY | Freq: Every day | RESPIRATORY_TRACT | Status: DC
  Administered 2020-12-18: 1 via RESPIRATORY_TRACT
  Filled 2020-12-17: qty 28

## 2020-12-17 MED ORDER — ALBUTEROL SULFATE (2.5 MG/3ML) 0.083% IN NEBU: 2.5000 mg | INHALATION_SOLUTION | RESPIRATORY_TRACT | Status: DC | PRN

## 2020-12-17 MED ORDER — FLUTICASONE FUROATE-VILANTEROL 100-25 MCG/ACT IN AEPB: 1.0000 | INHALATION_SPRAY | Freq: Every day | RESPIRATORY_TRACT | Status: DC

## 2020-12-17 MED ORDER — ASPIRIN 81 MG PO CHEW
81.0000 mg | CHEWABLE_TABLET | Freq: Every day | ORAL | Status: DC
  Administered 2020-12-17 – 2020-12-18 (×2): 81 mg via ORAL
  Filled 2020-12-17 (×2): qty 1

## 2020-12-17 MED ORDER — IOHEXOL 350 MG/ML SOLN
75.0000 mL | Freq: Once | INTRAVENOUS | Status: AC | PRN
  Administered 2020-12-17: 75 mL via INTRAVENOUS

## 2020-12-17 NOTE — ED Notes (Signed)
Provided pt beverage and snack.

## 2020-12-17 NOTE — ED Notes (Signed)
Pt is ambulatory to the bathroom.

## 2020-12-17 NOTE — Progress Notes (Signed)
Lower extremity venous and tcd bubble study has been completed.   Preliminary results in CV Proc.   Tiffany Horn 12/17/2020 3:36 PM

## 2020-12-17 NOTE — Evaluation (Signed)
Occupational Therapy Evaluation Patient Details Name: Tiffany Horn MRN: [id number] DOB: October 09, 1960 Today's Date: 12/17/2020   History of Present Illness Pt is a 60 y/o female admitted 11/17 secondary to blurred vision and worsening dizziness. Pt with recent admission for CVA and aneurysm coiling with stent. Imaging showed evolution of R PCA infarct and subacute infarct in R cerebellym. PMH includes HTN and migraines.   Clinical Impression   Patient admitted for the above diagnosis.  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  She is very close to her baseline, as the blurred vision continues from her prior CVA.  OT will follow in the acute setting in case of significant changes, but post acute OT is not needed.  Only deficits is a little bit of unsteadiness with mobility and continued blurred vision.         Recommendations for follow up therapy are one component of a multi-disciplinary discharge planning process, led by the attending physician.  Recommendations may be updated based on patient status, additional functional criteria and insurance authorization.   Follow Up Recommendations  Other (comment) (could consider Neuro Opthalmologist for vision follow up)    Assistance Recommended at Discharge PRN  Functional Status Assessment  Patient has had a recent decline in their functional status and demonstrates the ability to make significant improvements in function in a reasonable and predictable amount of time.  Equipment Recommendations  None recommended by OT    Recommendations for Other Services       Precautions / Restrictions Precautions Precautions: Fall Restrictions Weight Bearing Restrictions: No      Mobility Bed Mobility Overal bed mobility: Needs Assistance Bed Mobility: Supine to Sit;Sit to Supine     Supine to sit: Supervision Sit to supine: Supervision        Transfers Overall transfer level: Needs  assistance Equipment used: None Transfers: Sit to/from Stand Sit to Stand: Supervision                  Balance Overall balance assessment: Mild deficits observed, not formally tested                                         ADL either performed or assessed with clinical judgement   ADL       Grooming: Wash/dry hands;Wash/dry face;Sitting;Supervision/safety       Lower Body Bathing: Administrator, Civil Service;Sit to/from stand       Lower Body Dressing: Supervision/safety;Sit to/from stand   Toilet Transfer: Supervision/safety;Ambulation             General ADL Comments: unsteadiness due to dizziness     Vision Baseline Vision/History: 1 Wears glasses Patient Visual Report: Blurring of vision Eye Alignment: Within Functional Limits Ocular Range of Motion: Within Functional Limits Alignment/Gaze Preference: Within Defined Limits Tracking/Visual Pursuits: Able to track stimulus in all quads without difficulty Saccades: Within functional limits Convergence: Within functional limits Additional Comments: continuation of blurred vision from prior CVA     Perception Perception Perception: Within Functional Limits   Praxis Praxis Praxis: Intact    Pertinent Vitals/Pain Pain Assessment: No/denies pain     Hand Dominance Right   Extremity/Trunk Assessment Upper Extremity Assessment Upper Extremity Assessment: LUE deficits/detail LUE Deficits / Details: 4-/5 MMT - residual weakness from prior CVA LUE Sensation: WNL LUE Coordination: WNL   Lower Extremity Assessment Lower Extremity Assessment: Defer  to PT evaluation   Cervical / Trunk Assessment Cervical / Trunk Assessment: Normal   Communication Communication Communication: No difficulties   Cognition Arousal/Alertness: Awake/alert Behavior During Therapy: WFL for tasks assessed/performed Overall Cognitive Status: Within Functional Limits for tasks assessed                                                         Home Living Family/patient expects to be discharged to:: Private residence Living Arrangements: Alone Available Help at Discharge: Family;Friend(s);Available PRN/intermittently Type of Home: House Home Access: Stairs to enter [employer] of Steps: 2 Entrance Stairs-Rails: None Home Layout: Two level;Able to live on main level with bedroom/bathroom Alternate Level Stairs-Number of Steps: flight   Bathroom Shower/Tub: Tub/shower unit;Curtain   Biochemist, clinical: Standard     Home Equipment: None      Lives With: Alone    Prior Functioning/Environment Prior Level of Function : Independent/Modified Independent;Working/employed               ADLs Comments: indep, drives, works as a Scientist, product/process development Problem List: Impaired vision/perception;Impaired balance (sitting and/or standing)      OT Treatment/Interventions: Self-care/ADL training;Balance training    OT Goals(Current goals can be found in the care plan section) Acute Rehab OT Goals Patient Stated Goal: Get back to lofe OT Goal Formulation: With patient Time For Goal Achievement: 12/31/20 Potential to Achieve Goals: Good ADL Goals Pt Will Perform Lower Body Bathing: Independently;sit to/from stand Pt Will Perform Lower Body Dressing: Independently;sit to/from stand Pt Will Transfer to Toilet: Independently;ambulating  OT Frequency: Min 2X/week   Barriers to D/C:  None noted          Co-evaluation              AM-PAC OT "6 Clicks" Daily Activity     Outcome Measure Help from another person eating meals?: None Help from another person taking care of personal grooming?: None Help from another person toileting, which includes using toliet, bedpan, or urinal?: None Help from another person bathing (including washing, rinsing, drying)?: A Little Help from another person to put on and taking off regular upper body clothing?:  None Help from another person to put on and taking off regular lower body clothing?: A Little 6 Click Score: 22   End of Session Nurse Communication: Mobility status  Activity Tolerance: Patient tolerated treatment well Patient left: in bed  OT Visit Diagnosis: Low vision, both eyes (H54.2);Dizziness and giddiness (R42)                Time: 3614-4315 OT Time Calculation (min): 18 min Charges:  OT General Charges $OT Visit: 1 Visit OT Evaluation $OT Eval Moderate Complexity: 1 Mod  12/17/2020  RP, OTR/L  Acute Rehabilitation Services  Office:  [phone]   Metta Clines 12/17/2020, 1:53 PM

## 2020-12-17 NOTE — Progress Notes (Signed)
Cardiac monitor ordered for [facility], Dr. Marlou Porch to read. Follow up appt to be arranged.

## 2020-12-17 NOTE — Progress Notes (Addendum)
STROKE TEAM PROGRESS NOTE   ATTENDING NOTE: I reviewed above note and agree with the assessment and plan. Pt was seen and examined.   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  Also reported left ankle swelling for the last several days.  However, her visual field deficit and the diplopia have resolved.  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  Readmitted 12/02/2020 for dizziness, visual field deficit and diplopia found to have small acute infarcts in the right thalamus and right occipital lobe within the right PCA territory.  CTA head and neck post PCAs are grossly patent although the P1 segments are poorly evaluated due to streak artifact.  She was continued on aspirin Brilinta and Lipitor 80.  Over time, her visual field deficit and diplopia resolved.  However yesterday, she had episode of sudden headache lasting several hours and vertigo lasting an hour with blurry vision and nausea but no vomiting.  Symptoms now resolved.  MRI showed 2 punctate subacute infarct at right cerebellum, source unclear.  CTA head and neck showed focal high-grade stenosis right P2 after the end of the stent with diminutive reconstitution of flow in the remainder of the right PCA, unchanged.  Stable stent mediated coil embolization of basilar tip aneurysm.  A1c 5.4, LDL 156.  EF 60 to 65%.  DVT negative and TCD bubble negative for PFO.  Neuro examination normal.  Discussed with Dr. Kathyrn Sheriff, is hard to explain her right cerebellum tiny infarcts at this time but will recommend 30-day CardioNet monitoring to rule out A. fib.  Continue aspirin and Brilinta as well as Lipitor 80.  We will follow-up with Dr. Leonie Man at [facility] and Dr. Kathyrn Sheriff at [place].   For detailed assessment and  plan, please refer to above as I have made changes wherever appropriate.   Neurology will sign off. Please call with questions. Pt will follow up with stroke clinic Dr. Leonie Man at [facility] in about 4 weeks. Thanks for the consult.  Rosalin Hawking, MD PhD Stroke Neurology 12/17/2020 4:45 PM  I discussed with Dr. Grandville Silos and Dr. Kathyrn Sheriff. I spent  35 minutes in total face-to-face time with the patient, more than 50% of which was spent in counseling and coordination of care, reviewing test results, images and medication, and discussing the diagnosis, treatment plan and potential prognosis. This patient's care requiresreview of multiple databases, neurological assessment, discussion with family, other specialists and medical decision making of high complexity.   INTERVAL HISTORY No one is at the bedside at time of this exam. Patient is pleasant and cooperative. Ambulates unassisted.  Patient pending doppler and TCD with bubble study. If those results are unremarkable, pt may be discharged from stroke persepctive. Follow up with primary physician and neurosurgeon.   Vitals:   12/17/20 0845 12/17/20 0900 12/17/20 1245 12/17/20 1246  BP: 123/75 126/81 132/70 132/70  Pulse: 78 78 76 84  Resp: 17 (!) 23 16 (!) 22  Temp:    97.9 F (36.6 C)  TempSrc:    Oral  SpO2: 96% 95% 99% 100%  Weight:      Height:       CBC:  Recent Labs  Lab 12/16/20 2300 12/16/20 2317 12/17/20 0530  WBC 6.7  --  7.1  NEUTROABS 3.7  --   --   HGB 13.0 12.6 13.6  HCT 38.5 37.0 41.1  MCV 91.0  --  92.2  PLT 213  --  161   Basic Metabolic Panel:  Recent Labs  Lab 12/16/20 2300 12/16/20 2317 12/17/20 0530  NA 139 144 141  K 3.4* 3.4* 4.0  CL 106 106 109  CO2 24  --  24  GLUCOSE 118* 116* 107*  BUN 15 17 13   CREATININE 0.78 0.70 0.69  CALCIUM 8.7*  --  9.1  MG  --   --  2.0    Lipid Panel: No results for input(s): CHOL, TRIG, HDL, CHOLHDL, VLDL, LDLCALC in the last 168 hours.  HgbA1c: No results for input(s):  HGBA1C in the last 168 hours. Urine Drug Screen:  Recent Labs  Lab 12/17/20 0116  LABOPIA NONE DETECTED  COCAINSCRNUR NONE DETECTED  LABBENZ NONE DETECTED  AMPHETMU NONE DETECTED  THCU NONE DETECTED  LABBARB NONE DETECTED    Alcohol Level  Recent Labs  Lab 12/16/20 2300  ETH <10    IMAGING past 24 hours CT ANGIO HEAD NECK W WO CM  Result Date: 12/17/2020 CLINICAL DATA:  Blurry vision, dizziness, headache EXAM: CT ANGIOGRAPHY HEAD AND NECK TECHNIQUE: Multidetector CT imaging of the head and neck was performed using the standard protocol during bolus administration of intravenous contrast. Multiplanar CT image reconstructions and MIPs were obtained to evaluate the vascular anatomy. Carotid stenosis measurements (when applicable) are obtained utilizing NASCET criteria, using the distal internal carotid diameter as the denominator. CONTRAST:  64mL OMNIPAQUE IOHEXOL 350 MG/ML SOLN COMPARISON:  Brain MRI obtained earlier the same day, noncontrast CT head 12/16/2020, CTA head/neck 12/04/2020 FINDINGS: CT HEAD FINDINGS Brain: The small infarcts in the right cerebellar hemisphere are not well seen on the current study. There is no evidence of acute territorial infarct. There is no acute intracranial hemorrhage or extra-axial fluid collection. Parenchymal volume is normal. There is no mass lesion. There is no midline shift. Vascular: Postsurgical changes reflecting prior basilar tip aneurysm coiling are again seen. The vasculature is fully assessed below. Skull: Normal. Negative for fracture or focal lesion. Sinuses and orbits: The paranasal sinuses are clear. The globes and orbits are unremarkable. 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 The aortic arch and proximal branch vessels are otherwise unremarkable. Right carotid system: The right common, internal, and external carotid arteries are patent, without  hemodynamically significant stenosis, occlusion, dissection, or aneurysm. Left carotid system: The left common, internal, and external carotid arteries are patent, without hemodynamically significant stenosis, occlusion, dissection, or aneurysm. Vertebral arteries: The vertebral arteries are patent, without hemodynamically significant stenosis, occlusion, dissection, or aneurysm. Skeleton: There is mild multilevel degenerative change of the cervical spine. There is no visible canal hematoma. There is no acute osseous abnormality or aggressive osseous lesion. Other neck: The soft tissues of the neck are unremarkable. Upper chest: The imaged lung apices are clear. Review of the MIP images confirms the above findings CTA HEAD FINDINGS Anterior circulation: The bilateral intracranial ICAs are patent. The bilateral MCAs are patent. The bilateral ACAs are patent. There is no aneurysm. Posterior circulation:  The bilateral V4 segments are patent. Postprocedural changes reflecting stenting of the distal basilar artery extending to the right PCA and coil embolization of a basilar tip aneurysm are again seen. Streak artifact from the hardware degrades evaluation of the surrounding vasculature, particularly the P1 segments, worse on the right. There is focal high-grade stenosis of the right  P2 segment just after the stent (14-133). There is overall diminished caliber in the remainder of the right PCA without other occlusion. These findings are unchanged. The left PCA appears patent. There is no definite filling of the treated aneurysm. There is no definite new aneurysm. Venous sinuses: Patent. Anatomic variants: The posterior communicating arteries are not definitely identified. Review of the MIP images confirms the above findings IMPRESSION: 1. The small infarcts in the right cerebellar hemisphere are not well seen on the current study. No evidence of acute territorial infarct. 2. Focal high-grade stenosis of the right P2  segment just after the end of the stent with diminutive reconstitution of flow in the remainder of the right PCA, unchanged. 3. Stable postprocedural changes reflecting stent mediate coil embolization of basilar tip aneurysm. Streak artifact from the hardware degrades evaluation of the P1 segments, more so on the right. 4. Otherwise, patent vasculature of the head and neck with no other hemodynamically significant stenosis or occlusion. Electronically Signed   By: Valetta Mole M.D.   On: 12/17/2020 12:54   MR BRAIN W WO CONTRAST  Result Date: 12/17/2020 CLINICAL DATA:  60 year old female code stroke presentation. History of treated basilar artery aneurysm last month, and small scattered PCA territory infarcts on MRI earlier this month. Blurred vision. EXAM: MRI HEAD WITHOUT AND WITH CONTRAST TECHNIQUE: Multiplanar, multiecho pulse sequences of the brain and surrounding structures were obtained without and with intravenous contrast. CONTRAST:  29mL GADAVIST GADOBUTROL 1 MMOL/ML IV SOLN COMPARISON:  Head CT 12/16/2020.  Recent brain MRI 12/02/2020. FINDINGS: Brain: Regressed diffusion abnormality in the right PCA territory from 2 weeks ago, but several small new foci of diffusion restriction in the right cerebellum now (series 5, image 75). These are punctate, similar to the right PCA lesions earlier this month. Following contrast there is patchy post ischemic enhancement in the right occipital pole and dorsal right thalamus that were affected earlier this month. The small right cerebellar lesions also enhance (series 21, image 17). No dural thickening. No midline shift, mass effect, evidence of mass lesion, ventriculomegaly, extra-axial collection or acute intracranial hemorrhage. Cervicomedullary junction and pituitary are within normal limits. Elsewhere gray and white matter signal is stable. There are few chronic microhemorrhages in the thalami and the left parietal lobe, unchanged. Vascular: Major intracranial  vascular flow voids are stable. Susceptibility at the right basilar tip from embolization coil pack again noted. The major dural venous sinuses are enhancing and appear to be patent. Skull and upper cervical spine: Negative visible cervical spine. Visualized bone marrow signal is within normal limits. Sinuses/Orbits: Orbits appear stable and negative. Paranasal Visualized paranasal sinuses and mastoids are stable and well aerated. Other: Dedicated internal auditory imaging also provided today. Normal cerebellopontine angles. Normal bilateral cisternal and intracanalicular 7th and 8th cranial nerve segments. Symmetric T2 signal in the bilateral cochlea and vestibular structures. No abnormal enhancement identified. IMPRESSION: 1. Expected evolution of the small scattered Right PCA territory infarcts since 12/02/2020. Two small new infarcts in the Right cerebellum, but these appear subacute. No associated hemorrhage or mass effect. 2. No other acute intracranial abnormality. Negative internal auditory imaging today. 3. Post embolization susceptibility artifact at the right Basilar Artery tip. Electronically Signed   By: Genevie Ann M.D.   On: 12/17/2020 05:19   CT RENAL STONE STUDY  Result Date: 12/17/2020 CLINICAL DATA:  Lower abdominal pain bilateral.  Hematuria EXAM: CT ABDOMEN AND PELVIS WITHOUT CONTRAST TECHNIQUE: Multidetector CT imaging of the abdomen and pelvis was performed  following the standard protocol without IV contrast. COMPARISON:  CT October 19, 2019. FINDINGS: Lower chest: No acute abnormality. Hepatobiliary: Unremarkable noncontrast appearance of hepatic. Gallbladder is unremarkable. No biliary ductal dilation. Pancreas: No pancreatic ductal dilation or evidence of acute inflammation. Spleen: Within limits. Adrenals/Urinary Tract: Bilateral adrenal glands are unremarkable. No hydronephrosis. No renal, ureteral or bladder calculi visualized. Urinary bladder is unremarkable for degree of distension.  Stomach/Bowel: No enteric contrast was administered. Small hiatal hernia otherwise stomach is unremarkable for degree of distension. No pathologic dilation of small or large bowel. The appendix is confidently identified is no pericecal inflammation. Terminal ileum unremarkable. Very mild wall thickening of the colon extending from the cecum through the splenic flexure. Vascular/Lymphatic: Scattered aortic atherosclerosis without abdominal aortic aneurysm. No pathologically enlarged abdominal or pelvic lymph nodes. Reproductive: Uterus and bilateral adnexa are unremarkable. Other: No new with peritoneum. No walled off fluid collections. No significant abdominopelvic free fluid. Musculoskeletal: Again seen is a moderate compression fracture of the T12 vertebral body. Multilevel degenerative changes spine. No acute osseous abnormality. IMPRESSION: 1. Very mild wall thickening of the colon extending from the cecum through the splenic flexure, which may reflect under distension or represent a mild infectious or inflammatory colitis. 2. No evidence of nephrolithiasis or obstructive uropathy. 3.  Aortic Atherosclerosis (ICD10-I70.0). Electronically Signed   By: Dahlia Bailiff M.D.   On: 12/17/2020 10:41   CT HEAD CODE STROKE WO CONTRAST  Result Date: 12/16/2020 CLINICAL DATA:  Code stroke.  Acute neurologic deficit EXAM: CT HEAD WITHOUT CONTRAST TECHNIQUE: Contiguous axial images were obtained from the base of the skull through the vertex without intravenous contrast. COMPARISON:  None. FINDINGS: Brain: There is no mass, hemorrhage or extra-axial collection. The size and configuration of the ventricles and extra-axial CSF spaces are normal. The brain parenchyma is normal, without evidence of acute or chronic infarction. Vascular: Coil mass at the tip of the basilar artery. Skull: The visualized skull base, calvarium and extracranial soft tissues are normal. Sinuses/Orbits: No fluid levels or advanced mucosal  thickening of the visualized paranasal sinuses. No mastoid or middle ear effusion. The orbits are normal. ASPECTS [facility] Stroke Program Early CT Score) - Ganglionic level infarction (caudate, lentiform nuclei, internal capsule, insula, M1-M3 cortex): 7 - Supraganglionic infarction (M4-M6 cortex): 3 Total score (0-10 with 10 being normal): 10 IMPRESSION: 1. No acute intracranial abnormality. 2. ASPECTS is 10. These results were communicated to Dr. Kerney Elbe at 11:12 pm on 12/16/2020 by text page via the [facility] messaging system. Electronically Signed   By: Ulyses Jarred M.D.   On: 12/16/2020 23:16    PHYSICAL EXAM  Mental Status: Alert, fully oriented, mildly anxious affect.  Speech fluent without evidence of aphasia.  Able to follow all commands without difficulty. Cranial Nerves: II: Temporal visual fields intact with no extinction to DSS. Pupils round bilaterally and briskly reactive. Right pupil 3 mm >> 1 mm, left pupil 3 mm >> 2 mm.  III,IV, VI: No ptosis. EOMI. No nystagmus. Gaze is conjugate. No double vision.  V,VII: Smile symmetric, facial temp sensation equal bilaterally VIII: Hearing intact to voice IX,X: Palate rises symmetrically XI: Symmetric shoulder shrug XII: Midline tongue extension Motor: Right :  Upper extremity   5/5                                       Left:     Upper extremity  5/5             Lower extremity   5/5                                                  Lower extremity   5/5 No pronator drift Sensory: Temp and light touch intact throughout, bilaterally. No extinction to DSS.  Deep Tendon Reflexes: 2+ and symmetric throughout Plantars: Right: downgoing                           Left: downgoing Cerebellar: No ataxia with FNF and H-S bilaterally   ASSESSMENT/PLAN [name] is a 60 y.o. female  recently discharged after aneurysm coiling in addition to stent placement by Dr. Kathyrn Sheriff, 2 perioperative strokes diagnosed by MRI 3 days after the procedure,  anxiety, asthma, depression, prior miscarriage, HLD, HTN, migraines and ulcerative colitis, presenting to the ED as a Code Stroke with acute onset of vertigo, blurred vision and left sided weakness.    Symptom onset was today after work. LKN 2000.  She had got home from work and the above symptoms suddenly started at about 8 PM.  No head injury or LOC. Does have a bit of a headache which happened last time she was seen for similar symptoms about 2 weeks ago as well. Was recently approved to go back to work by the interventional neurology team. She has had some dizziness. She did have double vision with her initial presentation that led to the diagnosis of the aneurysm, but has not had double vision with this episode.    Home medications include ASA, Brilinta and atorvastatin.  MRI brain shows two subacute small new infarcts in the right cerebellum, as well as expected evolution of the small scattered right PCA territory infarcts since 12/02/20  Stroke:  right cerebellar 2 punctate infarcts, source unclear   CTA head & neck   1. The small infarcts in the right cerebellar hemisphere are not well seen on the current study. No evidence of acute territorial infarct. 2. Focal high-grade stenosis of the right P2 segment just after the end of the stent with diminutive reconstitution of flow in the remainder of the right PCA, unchanged. 3. Stable postprocedural changes reflecting stent mediate coil embolization of basilar tip aneurysm. Streak artifact from the hardware degrades evaluation of the P1 segments, more so on the right. 4. Otherwise, patent vasculature of the head and neck with no other hemodynamically significant stenosis or occlusion. MRI BRAIN:  1. Expected evolution of the small scattered Right PCA territory infarcts since 12/02/2020. Two small new infarcts in the Right cerebellum, but these appear subacute. No associated hemorrhage or mass effect.   2. No other acute intracranial  abnormality. Negative internal auditory imaging today.   3. Post embolization susceptibility artifact at the right Basilar Artery tip. 2D Echo pending   LDL 156 HgbA1c 5.4 VTE prophylaxis - scd    Diet   Diet Heart Room service appropriate? Yes; Fluid consistency: Thin   aspirin 81 mg daily and Brilinta (ticagrelor) 90 mg bid prior to admission, now on aspirin 81 mg daily and Brilinta (ticagrelor) 90 mg bid.   Therapy recommendations:  home  Disposition:  home  Hypertension Home meds:  amlodipine Stable Permissive hypertension (OK if < 220/120) but gradually normalize in 5-7  days Long-term BP goal normotensive  Hyperlipidemia Home meds:  lipitor 80mg ,  resumed in hospital LDL 156, goal < 70  High intensity statin   Continue statin at discharge    HgbA1c 5.4, goal < 7.0 CBGs Recent Labs    12/16/20 2247  GLUCAP 126*    SSI  Other Stroke Risk Factors  Hx stroke/TIA  Other Active Problems    Hospital day # 0     To contact Stroke Continuity provider, please refer to [email or web address] After hours, contact General Neurology

## 2020-12-17 NOTE — Evaluation (Signed)
Physical Therapy Evaluation Patient Details Name: Tiffany Horn MRN: [id number] DOB: [birth year] Today's Date: 12/17/2020  History of Present Illness  Pt is a 60 y/o female admitted 11/17 secondary to blurred vision and worsening dizziness. Pt with recent admission for CVA and aneurysm coiling with stent. Imaging showed evolution of R PCA infarct and subacute infarct in R cerebellym. PMH includes HTN and migraines.  Clinical Impression  Pt admitted secondary to problem above with deficits below. Pt requiring min A for bed mobility on stretcher and supervision for transfers and gait. Pt reports increased dizziness with horizontal/vertical head turns so worked on strategies to help minimize dizziness. Reports she has appointment scheduled with outpatient PT once discharged. Will continue to follow acutely to maximize functional mobility independence and safety.        Recommendations for follow up therapy are one component of a multi-disciplinary discharge planning process, led by the attending physician.  Recommendations may be updated based on patient status, additional functional criteria and insurance authorization.  Follow Up Recommendations Outpatient PT    Assistance Recommended at Discharge PRN  Functional Status Assessment Patient has had a recent decline in their functional status and demonstrates the ability to make significant improvements in function in a reasonable and predictable amount of time.  Equipment Recommendations  None recommended by PT    Recommendations for Other Services       Precautions / Restrictions Precautions Precautions: Fall Restrictions Weight Bearing Restrictions: No      Mobility  Bed Mobility Overal bed mobility: Needs Assistance Bed Mobility: Supine to Sit;Sit to Supine     Supine to sit: Min assist Sit to supine: Modified independent (Device/Increase time)   General bed mobility comments: Min A to sit up on stretcher. Mild dizziness  reported    Transfers Overall transfer level: Needs assistance Equipment used: None Transfers: Sit to/from Stand Sit to Stand: Supervision           General transfer comment: Supervision for safety.    Ambulation/Gait Ambulation/Gait assistance: Supervision Gait Distance (Feet): 150 Feet Assistive device: None Gait Pattern/deviations: Step-through pattern Gait velocity: mildly decreased     General Gait Details: Mildly slower, guarded gait. Worked on horizontal and vertical head turns as these motions tend to exacerbate dizziness. Worked on focusing on objects when turning head to help with dizziness.  Stairs            Wheelchair Mobility    Modified Rankin (Stroke Patients Only)       Balance Overall balance assessment: Mild deficits observed, not formally tested                               Standardized Balance Assessment Standardized Balance Assessment : Dynamic Gait Index   Dynamic Gait Index Level Surface: Normal Gait with Horizontal Head Turns: Mild Impairment Gait with Vertical Head Turns: Mild Impairment Gait and Pivot Turn: Mild Impairment Step Over Obstacle: Mild Impairment       Pertinent Vitals/Pain Pain Assessment: No/denies pain    Home Living Family/patient expects to be discharged to:: Private residence Living Arrangements: Alone Available Help at Discharge: Family;Friend(s);Available PRN/intermittently Type of Home: House Home Access: Stairs to enter Entrance Stairs-Rails: None Entrance Stairs-Number of Steps: 2 Alternate Level Stairs-Number of Steps: flight Home Layout: Two level;Able to live on main level with bedroom/bathroom Home Equipment: None      Prior Function Prior Level of Function : Independent/Modified Independent  Mobility Comments: Has just recently been cleared to go back to work following CVA       Hand Dominance   Dominant Hand: Right    Extremity/Trunk Assessment    Upper Extremity Assessment Upper Extremity Assessment: Defer to OT evaluation    Lower Extremity Assessment Lower Extremity Assessment: Overall WFL for tasks assessed    Cervical / Trunk Assessment Cervical / Trunk Assessment: Normal  Communication   Communication: No difficulties  Cognition Arousal/Alertness: Awake/alert Behavior During Therapy: WFL for tasks assessed/performed Overall Cognitive Status: Within Functional Limits for tasks assessed                                          General Comments General comments (skin integrity, edema, etc.): Pt reports she plans to follow up on an outpatient basis for current deficits. Reports she has appointment moday 11/21    Exercises     Assessment/Plan    PT Assessment Patient needs continued PT services  PT Problem List Decreased activity tolerance;Decreased balance;Decreased mobility       PT Treatment Interventions Gait training;Stair training;Functional mobility training;Therapeutic activities;Therapeutic exercise;Balance training;Patient/family education    PT Goals (Current goals can be found in the Care Plan section)  Acute Rehab PT Goals Patient Stated Goal: to get back to normal PT Goal Formulation: With patient Time For Goal Achievement: 12/30/20 Potential to Achieve Goals: Good    Frequency Min 3X/week   Barriers to discharge        Co-evaluation               AM-PAC PT "6 Clicks" Mobility  Outcome Measure Help needed turning from your back to your side while in a flat bed without using bedrails?: None Help needed moving from lying on your back to sitting on the side of a flat bed without using bedrails?: A Little Help needed moving to and from a bed to a chair (including a wheelchair)?: A Little Help needed standing up from a chair using your arms (e.g., wheelchair or bedside chair)?: A Little Help needed to walk in hospital room?: A Little Help needed climbing 3-5 steps with a  railing? : A Little 6 Click Score: 19    End of Session   Activity Tolerance: Patient tolerated treatment well Patient left: in bed;with call bell/phone within reach (on stretcher in ED) Nurse Communication: Mobility status PT Visit Diagnosis: Unsteadiness on feet (R26.81)    Time: 2263-3354 PT Time Calculation (min) (ACUTE ONLY): 19 min   Charges:   PT Evaluation $PT Eval Low Complexity: 1 Low          Lou Miner, DPT  Acute Rehabilitation Services  Pager: [phone] Office: [phone]   Rudean Hitt 12/17/2020, 10:02 AM

## 2020-12-17 NOTE — Progress Notes (Signed)
Have seen and assessed patient and agree with Dr. Inocente Salles assessment and plan.  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  Patient deemed not a tPA candidate.  Patient seen by neurology.  MRI of the head done concerning for subacute CVA.  Patient with some complaints of right flank pain and some hematuria in the urine and concerned about nephrolithiasis.  Urinalysis done on admission with some hematuria however not consistent with acute UTI.  We will get a CT renal stone protocol.  Follow.  No charge.

## 2020-12-17 NOTE — H&P (Signed)
History and Physical    PLEASE NOTE THAT DRAGON DICTATION SOFTWARE WAS USED IN THE CONSTRUCTION OF THIS NOTE.   Tiffany Horn JKD:[id number] DOB: [birth year] DOA: 12/16/2020  PCP: [name], DO  Patient coming from: home   I have personally briefly reviewed patient's old medical records in [place]  Chief Complaint: blurry vision  HPI: Tiffany Horn is a 60 y.o. 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   The patient reports cute onset of blurry vision involving bilateral eyes at 2000 on 12/16/20 associated with weakness in the left arm/leg.  She also noted worsening of her vertigo, with chart review revealing documentation of chronic vertigo, including documentation/Per most recent discharge summary on 12/06/2020.  The aforementioned worsening of her vertigo was associated with some nausea in the absence of vomiting.  She also noted associated posterior headache. patient brought to [facility] emergency department for further evaluation given constellation of symptoms, with ensuing resolution of her bilateral blurry vision, resolution of her left-sided weakness, and improvement in her vertigo back to her reported chronic baseline vertigo status.  Denies any associated acute paresthesias, numbness, dysphagia facial droop, slurred speech.  Not associate with any neck stiffness, subjective fever, chills, rigors, generalized myalgias.  Not associate with any chest pain, shortness of breath palpitations, diaphoresis, or syncope.  Denies any recent preceding trauma.  Per chart review, the patient has a history of multiple prior acute ischemic  CVAs.  In the setting of right basilar artery aneurysm, she underwent coiling with stent placement on 11/19/2020 at [facility] via neurosurgery.  Subsequently, she was admitted back to [facility] from 12/04/2020 to 12/06/2020 presenting with left-sided weakness, blurry vision involving bilateral eyes, it was found to have acute ischemic right thalamic and right occipital CVA with associated MRI of the brain.  Stroke work-up.  His most recent prior hospitalization included TEE, hemoglobin A1c 5.1%, LDL 156, prompting initiation of atorvastatin 80 mg p.o. daily.  He also underwent CTA head and neck, which reportedly showed no evidence of large vessel occlusion and no residual right basilar artery aneurysm following aforementioned coiling with stent procedure.  October 2022.  Her left-sided weakness reportedly resolved, and there was improvement in her blurry vision, there was some mention of potential mild residual blurry vision.  Associated discharge summary described patient's chronic vertigo, with vertigo felt to be at her chronic baseline at the time of discharge.  She is provided referral for outpatient physical therapy, including for vestibular PT.  In the setting of previous right basilar artery coiling/stent, she is already on Brilinta as well as daily baby aspirin, and reports good compliance with these medications.  She has a history of essential hypertension for which she is on amlodipine.  Conveys that she is a lifelong non-smoker.  Denies any known history of underlying diabetes, atrial fibrillation, obstructive sleep apnea.     ED Course:  Vital signs in the ED were notable for the following:  - Afebrile; heart rate 72-84; blood pressure 113/87 -  124/87; respiratory rate 16-20, oxygen saturation 95 to 98% on room air  CMP notable for the following: Sodium 139, potassium 3.4, creatinine 0.78 relative to most recent prior serum creatinine data point of 0.72  on 12/06/2020, glucose 118.  Urinary drug  screen found to be pan negative.  Serum ethanol less than 10.  CBC notable for white blood cell count 6700, hemoglobin 13, platelets 213.  INR 0.9.  Urinalysis showed no white blood cells.  Nitrate negative findings.  Screening COVID-19/influenza PCR found to be negative when checked in the ED this evening.  EKG shows sinus rhythm, heart rate 74, normal intervals, including QTC 445 ms, and no evidence of T wave or ST changes, including no evidence of ST elevation.  Noncontrast CT head showed no evidence of acute intracranial process, including no evidence of intracranial hemorrhage.  EDP discussed the patient's case and imaging with on-call neurology, Dr. Cheral Marker, recommended overnight observation to the hospitalist service for further evaluation of TIA versus recurrent acute ischemic CVA given similarity in patient's presenting symptoms associated to that with which she presented at time of her hospitalization at [facility] for acute ischemic CVA less than 2 weeks ago.  Given the extensive stroke work-up that she underwent during his most recent previous hospitalization, including TEE and CTA head and neck, Dr. Cheral Marker did not feel that it was necessary to repeat the studies at the present, but rather recommended pursuit of MRI brain.   While in the ED, the following were administered: Compazine 10 mg IV x1, Benadryl 12.5 mg IV x1, 5 mg p.o. x1, Phenergan 25 mg IV x1.    Review of Systems: As per HPI otherwise 10 point review of systems negative.   Past Medical History:  Diagnosis Date   Allergy    Anxiety    Arthritis    knees and spine, shoulder   Asthma    Bronchitis    hx - recurrent   Complication of anesthesia    waking up is not easy   Depression    Elevated IgE level 09/12/2017   09/12/2017 IgE 195   H/O miscarriage, not currently pregnant    Hx of irritable bowel syndrome    x2   Hyperlipidemia    diet controlled - no medication   Hypertension    not taking any meds at  present - under control per patient   Hypokalemia    with PNA admission (2.5)   Migraines    Pneumonia    4 episodes; hosp. admission 2014   PONV (postoperative nausea and vomiting)    Thyroid disease    UC (ulcerative colitis) [facility])    DX'D 2021    Past Surgical History:  Procedure Laterality Date   BREAST SURGERY     implants, then had them removed   COLONOSCOPY     greater 10 yrs ago - ? [facility]-2017 LAST   DILATION AND CURETTAGE OF UTERUS     IR ANGIO INTRA EXTRACRAN SEL INTERNAL CAROTID BILAT MOD SED  11/19/2020   IR ANGIO VERTEBRAL SEL VERTEBRAL BILAT MOD SED  11/19/2020   IR ANGIOGRAM FOLLOW UP STUDY  11/19/2020   IR ANGIOGRAM FOLLOW UP STUDY  11/19/2020   IR ANGIOGRAM FOLLOW UP STUDY  11/19/2020   IR ANGIOGRAM FOLLOW UP STUDY  11/19/2020   IR ANGIOGRAM FOLLOW UP STUDY  11/19/2020   IR ANGIOGRAM FOLLOW UP STUDY  11/19/2020   IR ANGIOGRAM FOLLOW UP STUDY  11/19/2020   IR ANGIOGRAM FOLLOW UP STUDY  11/19/2020   IR INTRA CRAN STENT  11/19/2020   IR TRANSCATH/EMBOLIZ  11/19/2020   LAPAROSCOPIC ABDOMINAL EXPLORATION  01/31/1992   endometriosis   ORIF HUMERUS FRACTURE Left 04/01/2013  DR Ninfa Linden - shoulder   ORIF HUMERUS FRACTURE Left 04/01/2013   Procedure: OPEN REDUCTION INTERNAL FIXATION (ORIF) LEFT PROXIMAL HUMERUS FRACTURE;  Surgeon: Mcarthur Rossetti, MD;  Location: [place];  Service: Orthopedics;  Laterality: Left;   RADIOLOGY WITH ANESTHESIA N/A 11/19/2020   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;  Surgeon: Consuella Lose, MD;  Location: [place];  Service: Radiology;  Laterality: N/A;   TONSILLECTOMY AND ADENOIDECTOMY      Social History:  reports that she has never smoked. She has never used smokeless tobacco. She reports current alcohol use of about 2.0 standard drinks per week. She reports that she does not use drugs.   Allergies  Allergen Reactions   Abilify [Aripiprazole] Nausea And Vomiting   Amoxicillin Diarrhea    Seroquel [Quetiapine Fumarate]     Unknown    Family History  Problem Relation Age of Onset   Breast cancer Mother    Diabetes Mother    Heart disease Mother    Diabetes Father    Dementia Father    Colon cancer Father    Stroke Paternal Grandmother    Diabetes Paternal Grandmother    Colon polyps Neg Hx    Esophageal cancer Neg Hx    Stomach cancer Neg Hx    Rectal cancer Neg Hx      Prior to Admission medications   Medication Sig Start Date End Date Taking? Authorizing Provider  albuterol (VENTOLIN HFA) 108 (90 Base) MCG/ACT inhaler Inhale 2 puffs into the lungs every 6 (six) hours as needed for wheezing or shortness of breath. 05/21/19   Kuneff, Renee A, DO  amLODipine (NORVASC) 10 MG tablet Take 1 tablet (10 mg total) by mouth daily. 12/06/20   Mercy Riding, MD  Ascorbic Acid (VITAMIN C) 1000 MG tablet Take 1,000 mg by mouth daily.    [provider]  aspirin 81 MG chewable tablet Chew 1 tablet (81 mg total) by mouth daily. 11/22/20   Consuella Lose, MD  atorvastatin (LIPITOR) 80 MG tablet Take 1 tablet (80 mg total) by mouth daily. 12/07/20   Mercy Riding, MD  B Complex Vitamins (B COMPLEX PO) Take 1 tablet by mouth daily.    [provider]  Budeson-Glycopyrrol-Formoterol (BREZTRI AEROSPHERE) 160-9-4.8 MCG/ACT AERO Inhale 2 puffs into the lungs in the morning and at bedtime. 04/28/20   Icard, Octavio Graves, DO  clonazePAM (KLONOPIN) 0.5 MG tablet Take 1 tablet (0.5 mg total) by mouth 3 (three) times daily as needed for anxiety. Patient taking differently: Take 0.5 mg by mouth daily as needed for anxiety. 04/23/20   Addison Lank, PA-C  levothyroxine (SYNTHROID) 112 MCG tablet Take 1 tablet (112 mcg total) by mouth daily. 11/25/20   Kuneff, Renee A, DO  MAGNESIUM GLYCINATE PO Take 1 tablet by mouth at bedtime.    [provider]  PARoxetine (PAXIL) 40 MG tablet Take 1 tablet (40 mg total) by mouth daily. 04/23/20   Donnal Moat T, PA-C  rizatriptan  (MAXALT) 5 MG tablet Take 1 tablet (5 mg total) by mouth daily as needed for migraine. 11/25/20   Howard Pouch A, DO  Spacer/Aero-Holding Dorise Bullion Use daily with inhaler 04/28/20   Icard, Octavio Graves, DO  ticagrelor (BRILINTA) 90 MG TABS tablet Take 1 tablet (90 mg total) by mouth 2 (two) times daily. 11/22/20   Consuella Lose, MD     Objective    Physical Exam: Vitals:   12/17/20 0015 12/17/20 0030 12/17/20 0045 12/17/20  0126  BP: 124/87 (!) 135/122 (!) 125/113 113/87  Pulse: 74 84 81 84  Resp: 19 20 (!) 21 14  Temp:      TempSrc:      SpO2: 95% 97% 93% 98%  Weight:      Height:        General: appears to be stated age; alert, oriented Skin: warm, dry, no rash Head:  AT/[place] Mouth:  Oral mucosa membranes appear moist, normal dentition Neck: supple; trachea midline Heart:  RRR; did not appreciate any M/R/G Lungs: CTAB, did not appreciate any wheezes, rales, or rhonchi Abdomen: + BS; soft, ND, NT Vascular: 2+ pedal pulses b/l; 2+ radial pulses b/l Extremities: no peripheral edema, no muscle wasting Neuro: 5/5 strength of the proximal and distal flexors and extensors of the upper and lower extremities bilaterally; sensation intact in upper and lower extremities b/l; cranial nerves II through XII grossly intact; no pronator drift; no evidence suggestive of slurred speech, dysarthria, or facial droop; Normal muscle tone. No tremors.    Labs on Admission: I have personally reviewed following labs and imaging studies  CBC: Recent Labs  Lab 12/16/20 2300 12/16/20 2317  WBC 6.7  --   NEUTROABS 3.7  --   HGB 13.0 12.6  HCT 38.5 37.0  MCV 91.0  --   PLT 213  --    Basic Metabolic Panel: Recent Labs  Lab 12/16/20 2300 12/16/20 2317  NA 139 144  K 3.4* 3.4*  CL 106 106  CO2 24  --   GLUCOSE 118* 116*  BUN 15 17  CREATININE 0.78 0.70  CALCIUM 8.7*  --    GFR: Estimated Creatinine Clearance: 80.3 mL/min (by C-G formula based on SCr of 0.7 mg/dL). Liver  Function Tests: Recent Labs  Lab 12/16/20 2300  AST 29  ALT 27  ALKPHOS 63  BILITOT 0.5  PROT 6.6  ALBUMIN 3.6   No results for input(s): LIPASE, AMYLASE in the last 168 hours. No results for input(s): AMMONIA in the last 168 hours. Coagulation Profile: Recent Labs  Lab 12/16/20 2300  INR 0.9   Cardiac Enzymes: No results for input(s): CKTOTAL, CKMB, CKMBINDEX, TROPONINI in the last 168 hours. BNP (last 3 results) No results for input(s): PROBNP in the last 8760 hours. HbA1C: No results for input(s): HGBA1C in the last 72 hours. CBG: Recent Labs  Lab 12/16/20 2247  GLUCAP 126*   Lipid Profile: No results for input(s): CHOL, HDL, LDLCALC, TRIG, CHOLHDL, LDLDIRECT in the last 72 hours. Thyroid Function Tests: No results for input(s): TSH, T4TOTAL, FREET4, T3FREE, THYROIDAB in the last 72 hours. Anemia Panel: No results for input(s): VITAMINB12, FOLATE, FERRITIN, TIBC, IRON, RETICCTPCT in the last 72 hours. Urine analysis:    Component Value Date/Time   COLORURINE YELLOW 11/16/2020 2246   APPEARANCEUR CLEAR 11/16/2020 2246   LABSPEC 1.025 11/16/2020 2246   PHURINE 6.5 11/16/2020 2246   GLUCOSEU NEGATIVE 11/16/2020 2246   HGBUR TRACE (A) 11/16/2020 2246   BILIRUBINUR NEGATIVE 11/16/2020 2246   BILIRUBINUR neg 06/06/2019 1413   [place] 11/16/2020 2246   PROTEINUR NEGATIVE 11/16/2020 2246   UROBILINOGEN 0.2 06/06/2019 1413   UROBILINOGEN 1.0 12/27/2012 1025   NITRITE NEGATIVE 11/16/2020 2246   LEUKOCYTESUR SMALL (A) 11/16/2020 2246    Radiological Exams on Admission: CT HEAD CODE STROKE WO CONTRAST  Result Date: 12/16/2020 CLINICAL DATA:  Code stroke.  Acute neurologic deficit EXAM: CT HEAD WITHOUT CONTRAST TECHNIQUE: Contiguous axial images were obtained from the base of  the skull through the vertex without intravenous contrast. COMPARISON:  None. FINDINGS: Brain: There is no mass, hemorrhage or extra-axial collection. The size and configuration of  the ventricles and extra-axial CSF spaces are normal. The brain parenchyma is normal, without evidence of acute or chronic infarction. Vascular: Coil mass at the tip of the basilar artery. Skull: The visualized skull base, calvarium and extracranial soft tissues are normal. Sinuses/Orbits: No fluid levels or advanced mucosal thickening of the visualized paranasal sinuses. No mastoid or middle ear effusion. The orbits are normal. ASPECTS [facility] Stroke Program Early CT Score) - Ganglionic level infarction (caudate, lentiform nuclei, internal capsule, insula, M1-M3 cortex): 7 - Supraganglionic infarction (M4-M6 cortex): 3 Total score (0-10 with 10 being normal): 10 IMPRESSION: 1. No acute intracranial abnormality. 2. ASPECTS is 10. These results were communicated to Dr. Kerney Elbe at 11:12 pm on 12/16/2020 by text page via the [facility] messaging system. Electronically Signed   By: Ulyses Jarred M.D.   On: 12/16/2020 23:16     EKG: Independently reviewed, with result as described above.    Assessment/Plan   Principal Problem:   TIA (transient ischemic attack) Active Problems:   Hyperlipidemia   Hypothyroidism   Hypokalemia   Moderate persistent asthma   Hypertension    #) TIA: Presenting acute onset of bilateral blurry vision, left hemiparesis, and worsening of chronic vertigo at 2000 on 12/16/20, with interval resolution of the acute symptoms, including improvement in vertigo back to her baseline level of chronic vertigo, CT head showing no evidence of acute intracranial process, all in the context of a history of recurrent acute ischemic strokes, including recent hospitalization for acute ischemic right thalamic and right occipital CVA seen on MRI after presenting with very similar constellation of symptoms to that with which she presents this evening.   patient's case and imaging were d/w with on-call neurologist, Dr. Cheral Marker, who recommended overnight obs to hospitalist service for further  evaluation of TIA versus recurrent acute ischemic CVA given similarity of patient's presenting symptoms relative to that with which she presented at time of recent aforementioned hospitalization, during which she underwent extensive acute stroke work-up, as above.  Consequently, neurology does not recommend repeating his imaging studies at the present, with the exception of pursuing MRI brain to further evaluate acute versus acute on chronic symptoms with which she presented this evening.  Will also refrain from repeating lipid panel and hemoglobin A1c given that these were both checked at time of prior hospitalization less than 2 weeks ago.,  In October 2022, she also underwent right basilar artery coiling with stent procedure via neurosurgery, and reports good interval compliance with Brilinta and baby aspirin. Per neuro, not a tpa candidate.   Of note, patient's modifiable acute ischemic CVA risk factors, including essential hypertension as well as HLD, with recent initiation of atorvastatin 80 mg p.o. daily.  Most recent prior hospital course, as above.  Lifelong non-smoker.  No known history of diabetes, OSA, or Afib, with presenting EKG showing sinus rhythm without evidence of acute ischemic changes.   For now, pending additional neuro recs, will observe 24 hours of permissive hypertension from time of onset of acute focal neurologic deficits, with period of observance of permissive hypertension to end at 2000 on 12/17/2020.      Plan: Nursing bedside swallow evaluation x 1 now, and will not initiate oral medications or diet until the patient has passed this. Head of the bed at 30 degrees. Neuro checks per protocol. VS per  protocol. Will allow for permissive hypertension for 24 hours following onset of acute focal neurologic deficits, during which will hold home antihypertensive medications, with prn IV antihypertensive for  systolic blood pressure greater than 160 mmHg or diastolic blood pressure  greater than 110 mmHg until 2000 on 12/17/20. Monitor on telemetry, including monitoring for atrial fibrillation as modifiable risk factor for acute ischemic CVA. Neurology consulted.  MRI brain ordered, with result pending. Refraining from CTA head/neck, TTE TTE, lipid panel, hemoglobin A1c, as above. OT/PT consults placed in the morning, including consultation for vestibular PT.  Continue home Brilinta, daily baby aspirin, atorvastatin 80 mg p.o. daily.       #) Hypokalemia: Presenting serum potassium 3.4.  In the setting of presenting acute on chronic vertigo with nausea, will provide supplementation via IV route of administration.  Plan: Potassium chloride 40 mEq IV over 4 hours x 1 dose now.  Add on serum magnesium level.  Repeat BMP in the morning.      #) Moderate persistent asthma: Documented history of such, on scheduled Breztri as well as as needed albuterol inhaler as outpatient.  No evidence to suggest acute asthma exacerbation at this time.  Plan: Continue home respiratory regimen.  Add on serum magnesium level.      #) Essential hypertension: Documented history of such, on Norvasc as an outpatient.  Systolic blood pressures of been noted to be normotensive thus far.  We will observe 24 hours of permissive hypertension, as further detailed above.  Plan: Permissive hypertension for 24 hours, during which will hold amlodipine.  Close monitoring and symptom pressure via routine vital signs.      #) Hyperlipidemia: Patient reports good compliance with recently initiated atorvastatin 80 mg p.o. nightly.  Plan: Continue home statin.      #) Acquired hypothyroidism: On Synthroid at home.  Plan: Continue outpatient Synthroid.      DVT prophylaxis: SCD's   Code Status: Full code Family Communication: none Disposition Plan: Per Rounding Team Consults called: on-call neurology (Dr. Cheral Marker) consulted, as further detailed above;  Admission status: Observation; med  telemetry   PLEASE NOTE THAT DRAGON DICTATION SOFTWARE WAS USED IN THE CONSTRUCTION OF THIS NOTE.   [place] DO [facility]  From [place]   12/17/2020, 1:48 AM

## 2020-12-17 NOTE — ED Notes (Signed)
Pt transported to MRI 

## 2020-12-18 DIAGNOSIS — E782 Mixed hyperlipidemia: Secondary | ICD-10-CM | POA: Diagnosis not present

## 2020-12-18 DIAGNOSIS — I10 Essential (primary) hypertension: Secondary | ICD-10-CM | POA: Diagnosis not present

## 2020-12-18 DIAGNOSIS — E876 Hypokalemia: Secondary | ICD-10-CM | POA: Diagnosis not present

## 2020-12-18 DIAGNOSIS — I639 Cerebral infarction, unspecified: Secondary | ICD-10-CM | POA: Insufficient documentation

## 2020-12-18 DIAGNOSIS — E039 Hypothyroidism, unspecified: Secondary | ICD-10-CM | POA: Diagnosis not present

## 2020-12-18 LAB — BASIC METABOLIC PANEL
Anion gap: 7 (ref 5–15)
BUN: 11 mg/dL (ref 6–20)
CO2: 28 mmol/L (ref 22–32)
Calcium: 9.2 mg/dL (ref 8.9–10.3)
Chloride: 106 mmol/L (ref 98–111)
Creatinine, Ser: 0.69 mg/dL (ref 0.44–1.00)
GFR, Estimated: >60 mL/min (ref >60)
Glucose, Bld: 144 mg/dL — ABNORMAL HIGH (ref 70–99)
Potassium: 3.5 mmol/L (ref 3.5–5.1)
Sodium: 141 mmol/L (ref 135–145)

## 2020-12-18 MED ORDER — POTASSIUM CHLORIDE CRYS ER 20 MEQ PO TBCR
40.0000 meq | EXTENDED_RELEASE_TABLET | Freq: Once | ORAL | Status: AC
  Administered 2020-12-18: 40 meq via ORAL
  Filled 2020-12-18: qty 2

## 2020-12-18 MED ORDER — EZETIMIBE 10 MG PO TABS
10.0000 mg | ORAL_TABLET | Freq: Every day | ORAL | 1 refills | Status: DC
Start: 2020-12-18 — End: 2021-03-07

## 2020-12-18 MED ORDER — AMLODIPINE BESYLATE 10 MG PO TABS: 10.0000 mg | ORAL_TABLET | Freq: Every day | ORAL | 1 refills | Status: DC

## 2020-12-18 NOTE — Discharge Summary (Signed)
Physician Discharge Summary  Tiffany Horn BJY:[id number] DOB: 01/14/1961 DOA: 12/16/2020  PCP: Howard Pouch A, DO  Admit date: 12/16/2020 Discharge date: 12/18/2020  Time spent: 50 minutes  Recommendations for Outpatient Follow-up:  Follow-up with Dr. Leonie Man, neurology in 3 to 4 weeks. Follow-up with Kuneff, Renee A, DO in 2 weeks.  On follow-up patient will need a basic metabolic profile, magnesium level checked to follow-up on electrolytes and renal function   Discharge Diagnoses:  Principal Problem:   CVA (cerebral vascular accident) ([place]) Active Problems:   Hyperlipidemia   Hypothyroidism   Hypokalemia   Moderate persistent asthma   Hypertension   Discharge Condition: Stable and improved.  Diet recommendation: Heart healthy  Filed Weights   12/16/20 2254  Weight: 77.1 kg    History of present illness:  HPI per Dr. Skip Mayer Tiffany Horn is a 60 y.o. 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    The patient reports cute onset of blurry vision involving bilateral eyes at 2000 on 12/16/20 associated with weakness in the left arm/leg.  She also noted worsening of her vertigo, with chart review revealing documentation of chronic vertigo, including documentation/Per most recent discharge summary on 12/06/2020.  The aforementioned worsening of her vertigo was associated with some nausea in the absence of vomiting.  She also noted associated posterior headache. patient brought to [facility] emergency department for further evaluation given constellation of symptoms, with ensuing resolution of her bilateral blurry vision, resolution of her  left-sided weakness, and improvement in her vertigo back to her reported chronic baseline vertigo status.  Denies any associated acute paresthesias, numbness, dysphagia facial droop, slurred speech.  Not associate with any neck stiffness, subjective fever, chills, rigors, generalized myalgias.  Not associate with any chest pain, shortness of breath palpitations, diaphoresis, or syncope.  Denies any recent preceding trauma.   Per chart review, the patient has a history of multiple prior acute ischemic CVAs.  In the setting of right basilar artery aneurysm, she underwent coiling with stent placement on 11/19/2020 at [facility] via neurosurgery.  Subsequently, she was admitted back to [facility] from 12/04/2020 to 12/06/2020 presenting with left-sided weakness, blurry vision involving bilateral eyes, it was found to have acute ischemic right thalamic and right occipital CVA with associated MRI of the brain.  Stroke work-up.  His most recent prior hospitalization included TEE, hemoglobin A1c 5.1%, LDL 156, prompting initiation of atorvastatin 80 mg p.o. daily.  He also underwent CTA head and neck, which reportedly showed no evidence of large vessel occlusion and no residual right basilar artery aneurysm following aforementioned coiling with stent procedure.  October 2022.  Her left-sided weakness reportedly resolved, and there was improvement in her blurry vision, there was some mention of potential mild residual blurry vision.  Associated discharge summary described patient's chronic vertigo, with vertigo felt to be at her chronic baseline at the time of discharge.  She is provided referral for outpatient physical therapy, including for vestibular PT.  In the setting of previous right basilar artery coiling/stent, she is already on Brilinta as well as daily baby aspirin, and reports good compliance with these medications.   She has a history of essential hypertension for which she is on amlodipine.  Conveys that she  is a lifelong non-smoker.  Denies any known history of underlying diabetes, atrial fibrillation, obstructive sleep apnea.         ED Course:  Vital signs in the ED were notable for the following:  - Afebrile; heart rate 72-84; blood pressure 113/87 -  124/87; respiratory rate 16-20, oxygen saturation 95 to 98% on room air   CMP notable for the following: Sodium 139, potassium 3.4, creatinine 0.78 relative to most recent prior serum creatinine data point of 0.72 on 12/06/2020, glucose 118.  Urinary drug screen found to be pan negative.  Serum ethanol less than 10.  CBC notable for white blood cell count 6700, hemoglobin 13, platelets 213.  INR 0.9.  Urinalysis showed no white blood cells.  Nitrate negative findings.  Screening COVID-19/influenza PCR found to be negative when checked in the ED this evening.   EKG shows sinus rhythm, heart rate 74, normal intervals, including QTC 445 ms, and no evidence of T wave or ST changes, including no evidence of ST elevation.  Noncontrast CT head showed no evidence of acute intracranial process, including no evidence of intracranial hemorrhage.   EDP discussed the patient's case and imaging with on-call neurology, Dr. Cheral Marker, recommended overnight observation to the hospitalist service for further evaluation of TIA versus recurrent acute ischemic CVA given similarity in patient's presenting symptoms associated to that with which she presented at time of her hospitalization at [facility] for acute ischemic CVA less than 2 weeks ago.  Given the extensive stroke work-up that she underwent during his most recent previous hospitalization, including TEE and CTA head and neck, Dr. Cheral Marker did not feel that it was necessary to repeat the studies at the present, but rather recommended pursuit of MRI brain.    While in the ED, the following were administered: Compazine 10 mg IV x1, Benadryl 12.5 mg IV x1, 5 mg p.o. x1, Phenergan 25 mg IV x1.     Hospital Course:  1 acute  CVA: 2 punctate infarcts right cerebellar, unclear source -Patient had presented with blurry vision, left hemiparesis, worsening chronic vertigo at 8 PM the night of admission with some interval resolution of acute symptoms including improvement in vertigo back to her baseline level of chronic vertigo. -CT head done negative for any acute intracranial process. -CT angiogram head and neck done with small infarcts in the right cerebellar hemisphere not well seen, no evidence of acute territorial infarct.  Focal high-grade stenosis of the right P2 segment just after the end of the stent with diminutive reconstitution of flow in the remainder of the right PCA, unchanged, stable postprocedure changes reflecting stent mediated coil embolization of basilar tip aneurysm.  Streak artifact from hardware degrades evaluation of P1 segments more so on the right.  Otherwise patent vasculature of the head and neck with no other hemodynamically significant stenosis or occlusion. -Patient was seen in consultation by neurology who recommended further evaluation with MRI brain and continuation of patient's statin, aspirin, Brilinta. -Patient deemed not a candidate for tPA -MRI brain done with expected evolution of small scattered right PCA territory infarct since 12/02/2020.  2 small new infarcts in the right cerebellum but these appear subacute.  No associated hemorrhage or mass-effect.  No other acute abnormality intracranially noted, negative internal auditory imaging, postembolization susceptibility artifact in the right basilar artery tip. -2D echo not done as patient recently evaluated during last hospitalization. -Lower extremity Dopplers done were negative. -Transcranial Dopplers with bubble study done were negative. -  Fasting lipid panel from 12/05/2020 with LDL of 156. -Patient maintained on statin, Zetia added. -Patient's antihypertensive medications were held for permissive hypertension. -Patient was followed by  the stroke team who recommended 30-day CardioNet monitoring to rule out A. fib, continuation of aspirin, Brilinta, Lipitor with outpatient follow-up with Dr. Leonie Man, neurology at [facility] and Dr. Kathyrn Sheriff at neurosurgery. -Patient improved clinically and will be discharged home in stable and improved condition.  2.  Hypertension -Patient's antihypertensive medications were held during the hospitalization for permissive hypertension secondary to problem #1. -Antihypertensive medications will be resumed 1 to 2 days post discharge. -Outpatient follow-up with PCP.  3.  Hyperlipidemia -Patient maintained on home regimen statin. -Zetia added to regiment.  4.  Microscopic hematuria -Noted on urinalysis. -Patient also with some complaints of right flank pain prior to admission which had improved. -CT renal stone protocol done was negative for nephrolithiasis. -Outpatient follow-up with PCP.  5.  Hypokalemia -Repleted.  6.  Moderate persistent asthma -Remained stable during the hospitalization. -Patient maintained on home regimen of scheduled Breztri as well as albuterol inhaler. -Outpatient follow-up with PCP.  7.  Hypothyroidism -Patient maintained on home regimen Synthroid. Procedures: CT angiogram head and neck 12/17/2020 CT renal stone protocol 12/17/2020 MRI brain 12/17/2020 Lower extremity Dopplers 12/17/2020 Transcranial Dopplers with bubble study 12/17/2020  Consultations: Neurology: Dr. Cheral Marker 12/16/2020 Stroke MD: Dr. Erlinda Hong  Discharge Exam: Vitals:   12/18/20 0749 12/18/20 0816  BP:  122/87  Pulse:  95  Resp:  17  Temp:  98 F (36.7 C)  SpO2: 99% 100%    General: NAD Cardiovascular: RRR no murmurs rubs or gallops.  No JVD.  No lower extremity edema. Respiratory: Clear to auscultation bilaterally.  No wheezes, no crackles, no rhonchi.  Discharge Instructions   Discharge Instructions     Ambulatory referral to Neurology   Complete by: As directed    Follow up  with Dr. Leonie Man at [facility] in 3-4 weeks. Too complicated for RN to follow. Thanks.   Diet - low sodium heart healthy   Complete by: As directed    Increase activity slowly   Complete by: As directed       Allergies as of 12/18/2020       Reactions   Abilify [aripiprazole] Nausea And Vomiting   Amoxicillin Diarrhea   Seroquel [quetiapine Fumarate]    Unknown        Medication List     TAKE these medications    amLODipine 10 MG tablet Commonly known as: NORVASC Take 1 tablet (10 mg total) by mouth daily. Start taking on: December 19, 2020   aspirin 81 MG chewable tablet Chew 1 tablet (81 mg total) by mouth daily.   atorvastatin 80 MG tablet Commonly known as: LIPITOR Take 1 tablet (80 mg total) by mouth daily.   B COMPLEX PO Take 1 tablet by mouth daily.   Breo Ellipta 100-25 MCG/ACT Aepb Generic drug: fluticasone furoate-vilanterol Inhale 1 puff into the lungs daily as needed.   Breztri Aerosphere 160-9-4.8 MCG/ACT Aero Generic drug: Budeson-Glycopyrrol-Formoterol Inhale 2 puffs into the lungs in the morning and at bedtime.   clonazePAM 0.5 MG tablet Commonly known as: KLONOPIN Take 1 tablet (0.5 mg total) by mouth 3 (three) times daily as needed for anxiety. What changed: when to take this   ezetimibe 10 MG tablet Commonly known as: ZETIA Take 1 tablet (10 mg total) by mouth daily.   Incruse Ellipta 62.5 MCG/ACT Aepb Generic drug: umeclidinium bromide Inhale 1 puff  into the lungs daily.   levothyroxine 112 MCG tablet Commonly known as: SYNTHROID Take 1 tablet (112 mcg total) by mouth daily. What changed: when to take this   MAGNESIUM GLYCINATE PO Take 1 tablet by mouth at bedtime.   PARoxetine 40 MG tablet Commonly known as: PAXIL Take 1 tablet (40 mg total) by mouth daily.   rizatriptan 5 MG tablet Commonly known as: MAXALT Take 1 tablet (5 mg total) by mouth daily as needed for migraine.   Spacer/Aero-Holding [employer] Use daily with  inhaler   ticagrelor 90 MG Tabs tablet Commonly known as: BRILINTA Take 1 tablet (90 mg total) by mouth 2 (two) times daily.   vitamin C 1000 MG tablet Take 1,000 mg by mouth daily.       Allergies  Allergen Reactions   Abilify [Aripiprazole] Nausea And Vomiting   Amoxicillin Diarrhea   Seroquel [Quetiapine Fumarate]     Unknown    Follow-up Information     Garvin Fila, MD Follow up in 3 week(s).   Specialties: Neurology, Radiology Why: F/U IN 3-4 WEEKS Contact information: [address] [place] [address] [place], [place], DO. Schedule an appointment as soon as possible for a visit in 2 week(s).   Specialty: Family Medicine Contact information: [address] [place] [address] [phone]         Jerline Pain, MD .   Specialty: Cardiology Contact information: [phone] [address] [place] [address] [phone]                  The results of significant diagnostics from this hospitalization (including imaging, microbiology, ancillary and laboratory) are listed below for reference.    Significant Diagnostic Studies: CT Angio Head W or Wo Contrast  Result Date: 12/04/2020 CLINICAL DATA:  Neuro deficit, acute, stroke suspected recent stenting of aneurysm; Neuro deficit, acute, stroke suspected. Left-sided weakness with tingling in the arm and leg. Visual deficit with blurry vision following treatment of an aneurysm last month. EXAM: CT ANGIOGRAPHY HEAD AND NECK TECHNIQUE: Multidetector CT imaging of the head and neck was performed using the standard protocol during bolus administration of intravenous contrast. Multiplanar CT image reconstructions and MIPs were obtained to evaluate the vascular anatomy. Carotid stenosis measurements (when applicable) are obtained utilizing NASCET criteria, using the distal internal carotid diameter as the denominator. CONTRAST:  38mL OMNIPAQUE IOHEXOL 350 MG/ML  SOLN COMPARISON:  CTA head and neck 11/16/2020.  Head MRA 12/02/2020. 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 Widely patent arch vessel origins. Right carotid system: Patent without evidence of stenosis, dissection, or significant atherosclerosis. Left carotid system: Patent with minimal atherosclerotic plaque at the carotid bifurcation. No evidence of stenosis or dissection. Vertebral arteries: Patent without evidence of stenosis, dissection, or significant atherosclerosis. Mildly dominant left vertebral artery. Skeleton: Moderate mid to lower cervical disc degeneration. Asymmetrically advanced left facet arthrosis at C2-3. Other neck: No evidence of cervical lymphadenopathy or mass. Upper chest: No apical lung consolidation or mass. Review of the MIP images confirms the above findings CTA HEAD FINDINGS Anterior circulation: The internal carotid arteries are widely patent from skull base to carotid termini. ACAs and MCAs are patent without evidence of a proximal branch occlusion or flow limiting proximal stenosis although streak artifact obscures portions of the right A1 and proximal  A2 segments. No aneurysm is identified. Posterior circulation: The intracranial vertebral arteries are widely patent to the basilar. Patent PICA and SCA origins are seen bilaterally. The basilar artery is widely patent with a stent noted in the distal basilar artery extending into the right PCA. There has been coiling of a basilar tip aneurysm with prominent associated streak artifact. No residual aneurysm filling is identified. Both PCAs are grossly patent although the P1 segments are poorly evaluated due to streak artifact. Venous sinuses: Patent. Anatomic variants: None. Review of the MIP images confirms the above findings IMPRESSION: 1. No large vessel occlusion. 2. Basilar tip aneurysm coiling  without evidence of recurrent aneurysm filling. 3. Suboptimal assessment of the proximal PCAs and right ACA due to streak artifact. 4. Widely patent cervical carotid and vertebral arteries. Electronically Signed   By: Logan Bores M.D.   On: 12/04/2020 16:53   CT ANGIO HEAD NECK W WO CM  Result Date: 12/17/2020 CLINICAL DATA:  Blurry vision, dizziness, headache EXAM: CT ANGIOGRAPHY HEAD AND NECK TECHNIQUE: Multidetector CT imaging of the head and neck was performed using the standard protocol during bolus administration of intravenous contrast. Multiplanar CT image reconstructions and MIPs were obtained to evaluate the vascular anatomy. Carotid stenosis measurements (when applicable) are obtained utilizing NASCET criteria, using the distal internal carotid diameter as the denominator. CONTRAST:  76mL OMNIPAQUE IOHEXOL 350 MG/ML SOLN COMPARISON:  Brain MRI obtained earlier the same day, noncontrast CT head 12/16/2020, CTA head/neck 12/04/2020 FINDINGS: CT HEAD FINDINGS Brain: The small infarcts in the right cerebellar hemisphere are not well seen on the current study. There is no evidence of acute territorial infarct. There is no acute intracranial hemorrhage or extra-axial fluid collection. Parenchymal volume is normal. There is no mass lesion. There is no midline shift. Vascular: Postsurgical changes reflecting prior basilar tip aneurysm coiling are again seen. The vasculature is fully assessed below. Skull: Normal. Negative for fracture or focal lesion. Sinuses and orbits: The paranasal sinuses are clear. The globes and orbits are unremarkable. 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 The aortic arch and proximal branch vessels are otherwise unremarkable. Right carotid system: The right common, internal, and external carotid arteries are patent, without hemodynamically significant stenosis, occlusion, dissection, or  aneurysm. Left carotid system: The left common, internal, and external carotid arteries are patent, without hemodynamically significant stenosis, occlusion, dissection, or aneurysm. Vertebral arteries: The vertebral arteries are patent, without hemodynamically significant stenosis, occlusion, dissection, or aneurysm. Skeleton: There is mild multilevel degenerative change of the cervical spine. There is no visible canal hematoma. There is no acute osseous abnormality or aggressive osseous lesion. Other neck: The soft tissues of the neck are unremarkable. Upper chest: The imaged lung apices are clear. Review of the MIP images confirms the above findings CTA HEAD FINDINGS Anterior circulation: The bilateral intracranial ICAs are patent. The bilateral MCAs are patent. The bilateral ACAs are patent. There is no aneurysm. Posterior circulation:  The bilateral V4 segments are patent. Postprocedural changes reflecting stenting of the distal basilar artery extending to the right PCA and coil embolization of a basilar tip aneurysm are again seen. Streak artifact from the hardware degrades evaluation of the surrounding vasculature, particularly the P1 segments, worse on the right. There is focal high-grade stenosis of the right P2 segment just after the stent (14-133). There is overall diminished caliber in the remainder of the right PCA without other occlusion. These  findings are unchanged. The left PCA appears patent. There is no definite filling of the treated aneurysm. There is no definite new aneurysm. Venous sinuses: Patent. Anatomic variants: The posterior communicating arteries are not definitely identified. Review of the MIP images confirms the above findings IMPRESSION: 1. The small infarcts in the right cerebellar hemisphere are not well seen on the current study. No evidence of acute territorial infarct. 2. Focal high-grade stenosis of the right P2 segment just after the end of the stent with diminutive  reconstitution of flow in the remainder of the right PCA, unchanged. 3. Stable postprocedural changes reflecting stent mediate coil embolization of basilar tip aneurysm. Streak artifact from the hardware degrades evaluation of the P1 segments, more so on the right. 4. Otherwise, patent vasculature of the head and neck with no other hemodynamically significant stenosis or occlusion. Electronically Signed   By: Valetta Mole M.D.   On: 12/17/2020 12:54   DG Chest 2 View  Result Date: 11/27/2020 CLINICAL DATA:  Cough, fever EXAM: CHEST - 2 VIEW COMPARISON:  03/25/2020 FINDINGS: The heart size and mediastinal contours are within normal limits. Both lungs are clear. Disc degenerative disease of the thoracic spine. IMPRESSION: No acute abnormality of the lungs. Electronically Signed   By: Delanna Ahmadi M.D.   On: 11/27/2020 09:43   CT Angio Neck W and/or Wo Contrast  Result Date: 12/04/2020 CLINICAL DATA:  Neuro deficit, acute, stroke suspected recent stenting of aneurysm; Neuro deficit, acute, stroke suspected. Left-sided weakness with tingling in the arm and leg. Visual deficit with blurry vision following treatment of an aneurysm last month. EXAM: CT ANGIOGRAPHY HEAD AND NECK TECHNIQUE: Multidetector CT imaging of the head and neck was performed using the standard protocol during bolus administration of intravenous contrast. Multiplanar CT image reconstructions and MIPs were obtained to evaluate the vascular anatomy. Carotid stenosis measurements (when applicable) are obtained utilizing NASCET criteria, using the distal internal carotid diameter as the denominator. CONTRAST:  30mL OMNIPAQUE IOHEXOL 350 MG/ML SOLN COMPARISON:  CTA head and neck 11/16/2020.  Head MRA 12/02/2020. 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 Widely patent arch vessel origins. Right carotid system: Patent  without evidence of stenosis, dissection, or significant atherosclerosis. Left carotid system: Patent with minimal atherosclerotic plaque at the carotid bifurcation. No evidence of stenosis or dissection. Vertebral arteries: Patent without evidence of stenosis, dissection, or significant atherosclerosis. Mildly dominant left vertebral artery. Skeleton: Moderate mid to lower cervical disc degeneration. Asymmetrically advanced left facet arthrosis at C2-3. Other neck: No evidence of cervical lymphadenopathy or mass. Upper chest: No apical lung consolidation or mass. Review of the MIP images confirms the above findings CTA HEAD FINDINGS Anterior circulation: The internal carotid arteries are widely patent from skull base to carotid termini. ACAs and MCAs are patent without evidence of a proximal branch occlusion or flow limiting proximal stenosis although streak artifact obscures portions of the right A1 and proximal A2 segments. No aneurysm is identified. Posterior circulation: The intracranial vertebral arteries are widely patent to the basilar. Patent PICA and SCA origins are seen bilaterally. The basilar artery is widely patent with a stent noted in the distal basilar artery extending into the right PCA. There has been coiling of a basilar tip aneurysm with prominent associated streak artifact. No residual aneurysm filling is identified. Both PCAs are grossly patent although the P1 segments are poorly evaluated due to streak artifact. Venous  sinuses: Patent. Anatomic variants: None. Review of the MIP images confirms the above findings IMPRESSION: 1. No large vessel occlusion. 2. Basilar tip aneurysm coiling without evidence of recurrent aneurysm filling. 3. Suboptimal assessment of the proximal PCAs and right ACA due to streak artifact. 4. Widely patent cervical carotid and vertebral arteries. Electronically Signed   By: Logan Bores M.D.   On: 12/04/2020 16:53   MR ANGIO HEAD WO CONTRAST  Result Date:  12/02/2020 CLINICAL DATA:  Cerebral aneurysm EXAM: MRI HEAD WITHOUT CONTRAST MRA HEAD WITHOUT CONTRAST TECHNIQUE: Multiplanar, multi-echo pulse sequences of the brain and surrounding structures were acquired without intravenous contrast. Angiographic images of the Circle of Willis were acquired using MRA technique without intravenous contrast. COMPARISON:  No pertinent prior exam. FINDINGS: MRI HEAD FINDINGS Brain: Small foci of abnormal diffusion restriction in the right thalamus and right occipital lobe. No acute or chronic hemorrhage. Normal white matter signal, parenchymal volume and CSF spaces. The midline structures are normal. Vascular: Major flow voids are preserved. Skull and upper cervical spine: Normal calvarium and skull base. Visualized upper cervical spine and soft tissues are normal. Sinuses/Orbits:No paranasal sinus fluid levels or advanced mucosal thickening. No mastoid or middle ear effusion. Normal orbits. MRA HEAD FINDINGS POSTERIOR CIRCULATION: --Vertebral arteries: Normal --Inferior cerebellar arteries: Normal. --Basilar artery: Coil mass near the tip of the basilar artery. There is no visible residual aneurysm filling. --Superior cerebellar arteries: Normal. --Posterior cerebral arteries: The right P1 segment is poorly visualized due to susceptibility effects from the basilar coil mass. Left PCA is normal. The distal right PCA is patent. ANTERIOR CIRCULATION: --Intracranial internal carotid arteries: Normal. --Anterior cerebral arteries (ACA): Normal. --Middle cerebral arteries (MCA): Normal. ANATOMIC VARIANTS: None IMPRESSION: 1. Small foci of acute ischemia in the right thalamus and right occipital lobe, both within the right PCA territory. 2. Coil mass near the tip of the basilar artery. No residual aneurysm filling identified. Electronically Signed   By: Ulyses Jarred M.D.   On: 12/02/2020 22:57   MR BRAIN WO CONTRAST  Result Date: 12/02/2020 CLINICAL DATA:  Cerebral aneurysm EXAM: MRI  HEAD WITHOUT CONTRAST MRA HEAD WITHOUT CONTRAST TECHNIQUE: Multiplanar, multi-echo pulse sequences of the brain and surrounding structures were acquired without intravenous contrast. Angiographic images of the Circle of Willis were acquired using MRA technique without intravenous contrast. COMPARISON:  No pertinent prior exam. FINDINGS: MRI HEAD FINDINGS Brain: Small foci of abnormal diffusion restriction in the right thalamus and right occipital lobe. No acute or chronic hemorrhage. Normal white matter signal, parenchymal volume and CSF spaces. The midline structures are normal. Vascular: Major flow voids are preserved. Skull and upper cervical spine: Normal calvarium and skull base. Visualized upper cervical spine and soft tissues are normal. Sinuses/Orbits:No paranasal sinus fluid levels or advanced mucosal thickening. No mastoid or middle ear effusion. Normal orbits. MRA HEAD FINDINGS POSTERIOR CIRCULATION: --Vertebral arteries: Normal --Inferior cerebellar arteries: Normal. --Basilar artery: Coil mass near the tip of the basilar artery. There is no visible residual aneurysm filling. --Superior cerebellar arteries: Normal. --Posterior cerebral arteries: The right P1 segment is poorly visualized due to susceptibility effects from the basilar coil mass. Left PCA is normal. The distal right PCA is patent. ANTERIOR CIRCULATION: --Intracranial internal carotid arteries: Normal. --Anterior cerebral arteries (ACA): Normal. --Middle cerebral arteries (MCA): Normal. ANATOMIC VARIANTS: None IMPRESSION: 1. Small foci of acute ischemia in the right thalamus and right occipital lobe, both within the right PCA territory. 2. Coil mass near the tip of the basilar artery. No  residual aneurysm filling identified. Electronically Signed   By: Ulyses Jarred M.D.   On: 12/02/2020 22:57   MR BRAIN W WO CONTRAST  Result Date: 12/17/2020 CLINICAL DATA:  60 year old female code stroke presentation. 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 Blurred vision. EXAM: MRI HEAD WITHOUT AND WITH CONTRAST TECHNIQUE: Multiplanar, multiecho pulse sequences of the brain and surrounding structures were obtained without and with intravenous contrast. CONTRAST:  73mL GADAVIST GADOBUTROL 1 MMOL/ML IV SOLN COMPARISON:  Head CT 12/16/2020.  Recent brain MRI 12/02/2020. FINDINGS: Brain: Regressed diffusion abnormality in the right PCA territory from 2 weeks ago, but several small new foci of diffusion restriction in the right cerebellum now (series 5, image 75). These are punctate, similar to the right PCA lesions earlier this month. Following contrast there is patchy post ischemic enhancement in the right occipital pole and dorsal right thalamus that were affected earlier this month. The small right cerebellar lesions also enhance (series 21, image 17). No dural thickening. No midline shift, mass effect, evidence of mass lesion, ventriculomegaly, extra-axial collection or acute intracranial hemorrhage. Cervicomedullary junction and pituitary are within normal limits. Elsewhere gray and white matter signal is stable. There are few chronic microhemorrhages in the thalami and the left parietal lobe, unchanged. Vascular: Major intracranial vascular flow voids are stable. Susceptibility at the right basilar tip from embolization coil pack again noted. The major dural venous sinuses are enhancing and appear to be patent. Skull and upper cervical spine: Negative visible cervical spine. Visualized bone marrow signal is within normal limits. Sinuses/Orbits: Orbits appear stable and negative. Paranasal Visualized paranasal sinuses and mastoids are stable and well aerated. Other: Dedicated internal auditory imaging also provided today. Normal cerebellopontine angles. Normal bilateral cisternal and intracanalicular 7th and 8th cranial nerve segments. Symmetric T2 signal in the bilateral  cochlea and vestibular structures. No abnormal enhancement identified. IMPRESSION: 1. Expected evolution of the small scattered Right PCA territory infarcts since 12/02/2020. Two small new infarcts in the Right cerebellum, but these appear subacute. No associated hemorrhage or mass effect. 2. No other acute intracranial abnormality. Negative internal auditory imaging today. 3. Post embolization susceptibility artifact at the right Basilar Artery tip. Electronically Signed   By: Genevie Ann M.D.   On: 12/17/2020 05:19   IR Transcath/Emboliz  Result Date: 11/19/2020 PROCEDURE: DIAGNOSTIC CEREBRAL ANGIOGRAM COIL EMBOLIZATION OF BASILAR ANEURYSM HISTORY: The patient is a 60 year old woman presenting to the emergency department with several episodes of diplopia and approximally 1 week of significantly worsening headache. 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 She was therefore admitted to the hospital. After review of CTA it was felt that she would likely be a candidate for stent supported coiling. It was felt that her aneurysm was likely the cause of her new symptoms likely due to enlargement and irritation of the oculomotor nerve. Patient was loaded with 300 mg of Plavix and 325 mg of aspirin. ACCESS: The technical aspects of the procedure as well as its potential risks and benefits were reviewed with the patient and patient's family. These risks included but were not limited to stroke, intracranial hemorrhage, bleeding, infection, allergic reaction, damage to organs or vital structures, stroke, non-diagnostic procedure, and the catastrophic outcomes of heart attack, coma, and death. With an understanding of these risks, informed consent was obtained and witnessed. The patient was placed in the supine position  on the angiography table and the skin of right groin prepped in the usual sterile fashion. The procedure was  performed under general anesthesia. A 5-French sheath was introduced in the right common femoral artery using Seldinger technique. MEDICATIONS: HEPARIN: 8000 Units total. INTEGRILIN: 16mg  IA, 13mcg/kg/min IV continuous infusion CONTRAST:  23mL OMNIPAQUE IOHEXOL 300 MG/ML SOLN, 64mL OMNIPAQUE IOHEXOL 300 MG/ML SOLN, 26mL OMNIPAQUE IOHEXOL 300 MG/ML SOLNcc, Omnipaque 300 FLUOROSCOPY TIME:  FLUOROSCOPY TIME: See IR records TECHNIQUE: CATHETERS AND WIRES 5-French JB-1 catheter 180 cm 0.035" glidewire 6-French NeuronMax guide sheath 6-French Berenstein Select JB-1 catheter 0.070 Sofia guide catheter 0.058" CatV guidecatheter Synchro select standard microwire Synchro select soft microwire Asahi Chikai 10 microwire Excelsior XT-17 microcatheter x 2 COILS/STENTS USED Target 3D 6 mm x 15 cm Target 360 XL soft 5 mm by 10 cm Target 360 XL 4 mm x 12 cm Target 360 XL 3 mm x 9 cm not deployed Target 360 ultra 2 mm x 4 cm Target 360 ultra 2.5 mm x 4 cm x2 Target 360 ultra 2 mm x 4 cm x2 Neuroform Atlas 3.0 mm x 24 mm VESSELS CATHETERIZED Right internal carotid Left internal carotid Left vertebral Right vertebral Right common femoral Basilar artery Right posterior cerebral artery VESSELS STUDIED Right internal carotid, head Left internal carotid, head Left vertebral Right vertebral Right vertebral artery, head (during embolization) Right vertebral artery, head (post-coiling) Left vertebral artery, head (pre stent) Left vertebral artery, head (immediate post stent) Left vertebral artery, head (during coiling) Left vertebral artery, head (immediate post coiling) Left vertebral artery, head (post thrombolysis) PROCEDURAL NARRATIVE A 5-Fr JB-1 glide catheter was advanced over a 0.035 glidewire into the aortic arch. The above vessels were then sequentially catheterized and cervical / cerebral angiograms taken. After review of images, the catheter was removed without incident. The 5-Fr sheath was then exchanged over the glidewire for an  8-Fr sheath. Under real-time fluoroscopy, the guide sheath was advanced over the select catheter and glidewire into the descending aorta. 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 The guide sheath was then advanced into the proximal right vertebral artery. The Select catheter was removed and the 070 guide catheter was coaxially introduced over both the XT-17 micro catheters and synchro select micro wires. The microcatheters were then advanced under roadmap guidance into the distal cervical vertebral artery. The guide catheter was then tracked over the microcatheter into the distal vertebral artery. The micro catheters were then further advanced into the proximal basilar artery. This allowed advancement of the guide catheter into the proximal basilar artery. Initially, multiple attempts were made to access the right posterior cerebral artery unsuccessfully. This included a combination of different shape microcatheter tip. I then attempted access with the Chikai 10 microwire again with multiple different tip configurations all unsuccessfully. At this point I elected to attempt to place coils within the aneurysm without the aid of the stent as it did not appear access to the right posterior cerebral artery would be feasible. The three-dimensional coil as well as several filling coils were placed into the aneurysm. There is a small amount of coil loop extending down near the origin of the right posterior cerebral artery. As the last coil was being placed, some coil loops appear to be extending down further to words the origin of the posterior cerebral arteries and I therefore elected to stop coiling at this point. Angiogram was taken and the micro catheters were  withdrawn down into the guide catheter. Guide catheter was then withdrawn down into the cervical vertebral artery and angiogram was taken. This did reveal decreased  flow through the right posterior cerebral artery suggesting some compromise of the ostium possibly due to coil mass. I therefore elected to attempt to place a stent by accessing the left vertebral artery. The guide catheter was removed, and the guide sheath was withdrawn down into the descending aorta as was the guide sheath. The select catheter was introduced over the Glidewire, and the left subclavian followed by the proximal left vertebral artery were selected. Guide sheath was then advanced into the proximal left vertebral artery. Under roadmap guidance, the 058 CT 5 guide catheter was introduced over and XT 17 microcatheter and synchro select microwire. These were then navigated until the CAT 5 guide catheter was in the proximal basilar artery. The synchro select microwire was then used to access the right posterior cerebral artery. I was able to gain access to this vessel by buttressing against the coil mass already placed in the aneurysm. Microcatheter was then tracked into the P2 segment. Microwire was removed, and the above stent was introduced. Stent was then deployed under roadmap guidance without incident. The microcatheter was then advanced over the pushing wire into the lumen of the stent. Angiogram was then taken. It did appear that flow through the right posterior cerebral artery was improved in comparison to the prior angiogram. With the posterior cerebral artery somewhat protected, we elected to attempt to coil more of the residual aneurysm. The microwire was introduced into the microcatheter, and advanced into the lumen of the aneurysm through the stent. Microcatheter was then advanced into the residual aneurysm. The remainder of the above coils were then sequentially placed. After several coils were placed, I elected to complete the coiling procedure as I did not want to risk coil herniation through the tines of the stent and further compromise the posterior cerebral arteries. The coiling  catheter was withdrawn, and the guide catheter was withdrawn down into the cervical vertebral artery. At this point, angiogram was taken which appear to reveal more decreased flow in the right posterior cerebral artery and what appear to be filling defect in the inferior wall of the right posterior cerebral artery adjacent to the coil mass. Therefore under roadmap guidance, I advanced the microcatheter over the microwire back into the lumen of the stent. I then administered a 8 mg bolus dose of Integrilin intra-arterially. After approximately 10 minutes, angiogram was taken which did reveal somewhat increased flow through the right posterior cerebral artery and better perfusion. I then administered another 8 mg bolus dose intra-arterially. After another 10 minutes, another angiogram was taken again revealing improved flow through the right posterior cerebral artery. At this point, the pharmacy was able to deliver intravenous Integrilin which was started at a rate of 2 micrograms/kg/minute with the plan to continue for 18 hours. With improved flow in the right posterior cerebral artery, I elected to complete the procedure at this point. The guide catheter and guide sheath were then synchronously removed without incident. FINDINGS: Right internal carotid, head: Injection reveals the presence of a widely patent ICA, M1, and A1 segments and their branches. No aneurysms, AVMs, or high-flow fistulas are seen. Of note, the posterior communicating artery is very small and no visualization of the posterior circulation is noted. The parenchymal and venous phases are normal. The venous sinuses are widely patent. Left internal carotid, head: Injection reveals the presence of a widely  patent ICA, A1, and M1 segments and their branches. No aneurysms, AVMs, or high-flow fistulas are seen. Similar to the contralateral side, there is no significant visualization of the left posterior communicating artery. The parenchymal and venous  phases are normal. The venous sinuses are widely patent. Left vertebral: Injection reveals the presence of a widely patent vertebral artery. This leads to a widely patent basilar artery that terminates in bilateral P1. 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 This aneurysm measures approximately 6.8 x 5.8 mm with a neck measuring approximately 4.5 mm. The parenchymal and venous phases are normal. The venous sinuses are widely patent. Right vertebral: The vertebral artery is widely patent. No PICA aneurysm is seen. See basilar description above. Right vertebral artery, head (during embolization) Angiograms taken during coiling of the basilar aneurysm through the right vertebral artery access demonstrate progressive occlusion primarily of the dome of the aneurysm. The right vertebral artery and bilateral posterior cerebral arteries appear to be widely patent. There is residual aneurysm both at the neck and centrally within the aneurysm. No filling defects are noted. Right vertebral artery, head (post coiling) Angiogram taken after coiling through the right vertebral artery reveal occlusion of the dome of the aneurysm although there is residual aneurysm centrally and at the neck. There are small coil loops which appear to be in proximity to the origin of the right posterior cerebral artery. No filling defects are seen however flow through the right posterior cerebral artery is delayed in comparison to the left PCA, as well as in comparison to earlier angiograms suggesting flow limitation. Left vertebral artery, head (pre stent) Angiogram taken through the guide catheter in the left vertebral artery again demonstrates coil mass within the aneurysm with the residual filling as described above. Again seen is delayed filling of the right posterior cerebral artery with decreased perfusion in comparison to prior  angiograms. Again, no filling defects are seen to suggest thrombus. Left vertebral artery, head (immediate post stent) Angiogram reveals widely patent left vertebral artery with stent extending from the mid basilar into the right P2 segment. No filling defects are seen. Flow within the right posterior cerebral artery appears to be improved in comparison to the pre stent angiogram with more symmetric filling of the bilateral posterior cerebral arteries. Left vertebral artery, head (during coiling) Angiogram reveals further coil mass within the residual aneurysm which is progressively occluded. No filling defects are noted. The posterior cerebral arteries remain patent. Left vertebral artery, head (immediate post coiling) Angiogram taken from the left vertebral artery reveals patent basilar artery. The left posterior cerebral artery remains widely patent. Stent is in stable position. There is coil mass within the aneurysm, with minimal neck residual. There is decreased flow through the right posterior cerebral artery with filling defect in the inferior wall of the right P1 adjacent to the distal aneurysm neck likely representing thrombus. There is decreased perfusion of the right posterior cerebral artery territory in comparison to the prior angiograms. Left vertebral artery, head (post thrombolysis) Angiograms taken 10 minutes after initial 8 mg bolus dose and taken again 10 minutes after the second 8 mg bolus dose sequentially reveal improvement in flow through the right posterior cerebral artery. There is also much smaller filling defect along the wall of the posterior cerebral artery adjacent to the coil mass indicating dissolution of some of the thrombus. The final angiogram does show minimally decreased flow through  the right posterior cerebral artery with significantly improved perfusion of the right PCA territory in comparison to the previous angiograms. Venous sinuses remain widely patent. DISPOSITION: Upon  completion of the study, the femoral sheath was secured in place with nylon suture and connected to a pressure system. The patient was extubated and transferred to the postanesthesia care unit in stable hemodynamic condition. IMPRESSION: 1. 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 Procedure was complicated by stent thrombosis treated with intra arterial and intravenous Integrilin. The preliminary results of this procedure were shared with the patient's family. Electronically Signed   By: Consuella Lose   On: 11/19/2020 20:28   IR Angiogram Follow Up Study  Result Date: 11/19/2020 PROCEDURE: DIAGNOSTIC CEREBRAL ANGIOGRAM COIL EMBOLIZATION OF BASILAR ANEURYSM HISTORY: The patient is a 60 year old woman presenting to the emergency department with several episodes of diplopia and approximally 1 week of significantly worsening headache. 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 She was therefore admitted to the hospital. After review of CTA it was felt that she would likely be a candidate for stent supported coiling. It was felt that her aneurysm was likely the cause of her new symptoms likely due to enlargement and irritation of the oculomotor nerve. Patient was loaded with 300 mg of Plavix and 325 mg of aspirin. ACCESS: The technical aspects of the procedure as well as its potential risks and benefits were reviewed with the patient and patient's family. These risks included but were not limited to stroke, intracranial hemorrhage, bleeding, infection, allergic reaction, damage to organs or vital structures, stroke, non-diagnostic procedure, and the catastrophic outcomes of heart attack, coma, and death. With an understanding of these risks, informed consent was obtained and witnessed. The patient was placed in the supine position on the  angiography table and the skin of right groin prepped in the usual sterile fashion. The procedure was performed under general anesthesia. A 5-French sheath was introduced in the right common femoral artery using Seldinger technique. MEDICATIONS: HEPARIN: 8000 Units total. INTEGRILIN: 16mg  IA, 39mcg/kg/min IV continuous infusion CONTRAST:  10mL OMNIPAQUE IOHEXOL 300 MG/ML SOLN, 32mL OMNIPAQUE IOHEXOL 300 MG/ML SOLN, 5mL OMNIPAQUE IOHEXOL 300 MG/ML SOLNcc, Omnipaque 300 FLUOROSCOPY TIME:  FLUOROSCOPY TIME: See IR records TECHNIQUE: CATHETERS AND WIRES 5-French JB-1 catheter 180 cm 0.035" glidewire 6-French NeuronMax guide sheath 6-French Berenstein Select JB-1 catheter 0.070 Sofia guide catheter 0.058" CatV guidecatheter Synchro select standard microwire Synchro select soft microwire As[employer] Chikai 10 microwire Excelsior XT-17 microcatheter x 2 COILS/STENTS USED Target 3D 6 mm x 15 cm Target 360 XL soft 5 mm by 10 cm Target 360 XL 4 mm x 12 cm Target 360 XL 3 mm x 9 cm not deployed Target 360 ultra 2 mm x 4 cm Target 360 ultra 2.5 mm x 4 cm x2 Target 360 ultra 2 mm x 4 cm x2 Neuroform Atlas 3.0 mm x 24 mm VESSELS CATHETERIZED Right internal carotid Left internal carotid Left vertebral Right vertebral Right common femoral Basilar artery Right posterior cerebral artery VESSELS STUDIED Right internal carotid, head Left internal carotid, head Left vertebral Right vertebral Right vertebral artery, head (during embolization) Right vertebral artery, head (post-coiling) Left vertebral artery, head (pre stent) Left vertebral artery, head (immediate post stent) Left vertebral artery, head (during coiling) Left vertebral artery, head (immediate post coiling) Left vertebral artery, head (post thrombolysis) PROCEDURAL NARRATIVE A 5-Fr JB-1 glide catheter  was advanced over a 0.035 glidewire into the aortic arch. The above vessels were then sequentially catheterized and cervical / cerebral angiograms taken. After review of images, the  catheter was removed without incident. The 5-Fr sheath was then exchanged over the glidewire for an 8-Fr sheath. Under real-time fluoroscopy, the guide sheath was advanced over the select catheter and glidewire into the descending aorta. 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 The guide sheath was then advanced into the proximal right vertebral artery. The Select catheter was removed and the 070 guide catheter was coaxially introduced over both the XT-17 micro catheters and synchro select micro wires. The microcatheters were then advanced under roadmap guidance into the distal cervical vertebral artery. The guide catheter was then tracked over the microcatheter into the distal vertebral artery. The micro catheters were then further advanced into the proximal basilar artery. This allowed advancement of the guide catheter into the proximal basilar artery. Initially, multiple attempts were made to access the right posterior cerebral artery unsuccessfully. This included a combination of different shape microcatheter tip. I then attempted access with the Chikai 10 microwire again with multiple different tip configurations all unsuccessfully. At this point I elected to attempt to place coils within the aneurysm without the aid of the stent as it did not appear access to the right posterior cerebral artery would be feasible. The three-dimensional coil as well as several filling coils were placed into the aneurysm. There is a small amount of coil loop extending down near the origin of the right posterior cerebral artery. As the last coil was being placed, some coil loops appear to be extending down further to words the origin of the posterior cerebral arteries and I therefore elected to stop coiling at this point. Angiogram was taken and the micro catheters were withdrawn down into the guide catheter. Guide catheter was then  withdrawn down into the cervical vertebral artery and angiogram was taken. This did reveal decreased flow through the right posterior cerebral artery suggesting some compromise of the ostium possibly due to coil mass. I therefore elected to attempt to place a stent by accessing the left vertebral artery. The guide catheter was removed, and the guide sheath was withdrawn down into the descending aorta as was the guide sheath. The select catheter was introduced over the Glidewire, and the left subclavian followed by the proximal left vertebral artery were selected. Guide sheath was then advanced into the proximal left vertebral artery. Under roadmap guidance, the 058 CT 5 guide catheter was introduced over and XT 17 microcatheter and synchro select microwire. These were then navigated until the CAT 5 guide catheter was in the proximal basilar artery. The synchro select microwire was then used to access the right posterior cerebral artery. I was able to gain access to this vessel by buttressing against the coil mass already placed in the aneurysm. Microcatheter was then tracked into the P2 segment. Microwire was removed, and the above stent was introduced. Stent was then deployed under roadmap guidance without incident. The microcatheter was then advanced over the pushing wire into the lumen of the stent. Angiogram was then taken. It did appear that flow through the right posterior cerebral artery was improved in comparison to the prior angiogram. With the posterior cerebral artery somewhat protected, we elected to attempt to coil more of the residual aneurysm. The microwire was introduced into the microcatheter, and advanced into the lumen of the  aneurysm through the stent. Microcatheter was then advanced into the residual aneurysm. The remainder of the above coils were then sequentially placed. After several coils were placed, I elected to complete the coiling procedure as I did not want to risk coil herniation  through the tines of the stent and further compromise the posterior cerebral arteries. The coiling catheter was withdrawn, and the guide catheter was withdrawn down into the cervical vertebral artery. At this point, angiogram was taken which appear to reveal more decreased flow in the right posterior cerebral artery and what appear to be filling defect in the inferior wall of the right posterior cerebral artery adjacent to the coil mass. Therefore under roadmap guidance, I advanced the microcatheter over the microwire back into the lumen of the stent. I then administered a 8 mg bolus dose of Integrilin intra-arterially. After approximately 10 minutes, angiogram was taken which did reveal somewhat increased flow through the right posterior cerebral artery and better perfusion. I then administered another 8 mg bolus dose intra-arterially. After another 10 minutes, another angiogram was taken again revealing improved flow through the right posterior cerebral artery. At this point, the pharmacy was able to deliver intravenous Integrilin which was started at a rate of 2 micrograms/kg/minute with the plan to continue for 18 hours. With improved flow in the right posterior cerebral artery, I elected to complete the procedure at this point. The guide catheter and guide sheath were then synchronously removed without incident. FINDINGS: Right internal carotid, head: Injection reveals the presence of a widely patent ICA, M1, and A1 segments and their branches. No aneurysms, AVMs, or high-flow fistulas are seen. Of note, the posterior communicating artery is very small and no visualization of the posterior circulation is noted. The parenchymal and venous phases are normal. The venous sinuses are widely patent. Left internal carotid, head: Injection reveals the presence of a widely patent ICA, A1, and M1 segments and their branches. No aneurysms, AVMs, or high-flow fistulas are seen. Similar to the contralateral side, there is  no significant visualization of the left posterior communicating artery. The parenchymal and venous phases are normal. The venous sinuses are widely patent. Left vertebral: Injection reveals the presence of a widely patent vertebral artery. This leads to a widely patent basilar artery that terminates in bilateral P1. 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 This aneurysm measures approximately 6.8 x 5.8 mm with a neck measuring approximately 4.5 mm. The parenchymal and venous phases are normal. The venous sinuses are widely patent. Right vertebral: The vertebral artery is widely patent. No PICA aneurysm is seen. See basilar description above. Right vertebral artery, head (during embolization) Angiograms taken during coiling of the basilar aneurysm through the right vertebral artery access demonstrate progressive occlusion primarily of the dome of the aneurysm. The right vertebral artery and bilateral posterior cerebral arteries appear to be widely patent. There is residual aneurysm both at the neck and centrally within the aneurysm. No filling defects are noted. Right vertebral artery, head (post coiling) Angiogram taken after coiling through the right vertebral artery reveal occlusion of the dome of the aneurysm although there is residual aneurysm centrally and at the neck. There are small coil loops which appear to be in proximity to the origin of the right posterior cerebral artery. No filling defects are seen however flow through the right posterior cerebral artery is delayed in comparison to the left PCA, as well  as in comparison to earlier angiograms suggesting flow limitation. Left vertebral artery, head (pre stent) Angiogram taken through the guide catheter in the left vertebral artery again demonstrates coil mass within the aneurysm with the residual filling as described above. Again seen is delayed  filling of the right posterior cerebral artery with decreased perfusion in comparison to prior angiograms. Again, no filling defects are seen to suggest thrombus. Left vertebral artery, head (immediate post stent) Angiogram reveals widely patent left vertebral artery with stent extending from the mid basilar into the right P2 segment. No filling defects are seen. Flow within the right posterior cerebral artery appears to be improved in comparison to the pre stent angiogram with more symmetric filling of the bilateral posterior cerebral arteries. Left vertebral artery, head (during coiling) Angiogram reveals further coil mass within the residual aneurysm which is progressively occluded. No filling defects are noted. The posterior cerebral arteries remain patent. Left vertebral artery, head (immediate post coiling) Angiogram taken from the left vertebral artery reveals patent basilar artery. The left posterior cerebral artery remains widely patent. Stent is in stable position. There is coil mass within the aneurysm, with minimal neck residual. There is decreased flow through the right posterior cerebral artery with filling defect in the inferior wall of the right P1 adjacent to the distal aneurysm neck likely representing thrombus. There is decreased perfusion of the right posterior cerebral artery territory in comparison to the prior angiograms. Left vertebral artery, head (post thrombolysis) Angiograms taken 10 minutes after initial 8 mg bolus dose and taken again 10 minutes after the second 8 mg bolus dose sequentially reveal improvement in flow through the right posterior cerebral artery. There is also much smaller filling defect along the wall of the posterior cerebral artery adjacent to the coil mass indicating dissolution of some of the thrombus. The final angiogram does show minimally decreased flow through the right posterior cerebral artery with significantly improved perfusion of the right PCA territory in  comparison to the previous angiograms. Venous sinuses remain widely patent. DISPOSITION: Upon completion of the study, the femoral sheath was secured in place with nylon suture and connected to a pressure system. The patient was extubated and transferred to the postanesthesia care unit in stable hemodynamic condition. IMPRESSION: 1. 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 Procedure was complicated by stent thrombosis treated with intra arterial and intravenous Integrilin. The preliminary results of this procedure were shared with the patient's family. Electronically Signed   By: Consuella Lose   On: 11/19/2020 20:28   IR Angiogram Follow Up Study  Result Date: 11/19/2020 PROCEDURE: DIAGNOSTIC CEREBRAL ANGIOGRAM COIL EMBOLIZATION OF BASILAR ANEURYSM HISTORY: The patient is a 60 year old woman presenting to the emergency department with several episodes of diplopia and approximally 1 week of significantly worsening headache. 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 She was therefore admitted to the hospital. After review of CTA it was felt that she would likely be a candidate for stent supported coiling. It was felt that her aneurysm was likely the cause of her new symptoms likely due to enlargement and irritation of the oculomotor nerve. Patient was loaded with 300 mg of Plavix and 325 mg of aspirin. ACCESS: The technical aspects of the procedure as well as its potential risks and benefits were reviewed with the patient and patient's family. These risks included but were not limited to stroke, intracranial  hemorrhage, bleeding, infection, allergic reaction, damage to organs or vital structures, stroke, non-diagnostic procedure, and the catastrophic outcomes of heart attack, coma, and death. With an understanding of these risks, informed  consent was obtained and witnessed. The patient was placed in the supine position on the angiography table and the skin of right groin prepped in the usual sterile fashion. The procedure was performed under general anesthesia. A 5-French sheath was introduced in the right common femoral artery using Seldinger technique. MEDICATIONS: HEPARIN: 8000 Units total. INTEGRILIN: 16mg  IA, 38mcg/kg/min IV continuous infusion CONTRAST:  75mL OMNIPAQUE IOHEXOL 300 MG/ML SOLN, 30mL OMNIPAQUE IOHEXOL 300 MG/ML SOLN, 35mL OMNIPAQUE IOHEXOL 300 MG/ML SOLNcc, Omnipaque 300 FLUOROSCOPY TIME:  FLUOROSCOPY TIME: See IR records TECHNIQUE: CATHETERS AND WIRES 5-French JB-1 catheter 180 cm 0.035" glidewire 6-French NeuronMax guide sheath 6-French Berenstein Select JB-1 catheter 0.070 Sofia guide catheter 0.058" CatV guidecatheter Synchro select standard microwire Synchro select soft microwire As[employer] Chikai 10 microwire Excelsior XT-17 microcatheter x 2 COILS/STENTS USED Target 3D 6 mm x 15 cm Target 360 XL soft 5 mm by 10 cm Target 360 XL 4 mm x 12 cm Target 360 XL 3 mm x 9 cm not deployed Target 360 ultra 2 mm x 4 cm Target 360 ultra 2.5 mm x 4 cm x2 Target 360 ultra 2 mm x 4 cm x2 Neuroform Atlas 3.0 mm x 24 mm VESSELS CATHETERIZED Right internal carotid Left internal carotid Left vertebral Right vertebral Right common femoral Basilar artery Right posterior cerebral artery VESSELS STUDIED Right internal carotid, head Left internal carotid, head Left vertebral Right vertebral Right vertebral artery, head (during embolization) Right vertebral artery, head (post-coiling) Left vertebral artery, head (pre stent) Left vertebral artery, head (immediate post stent) Left vertebral artery, head (during coiling) Left vertebral artery, head (immediate post coiling) Left vertebral artery, head (post thrombolysis) PROCEDURAL NARRATIVE A 5-Fr JB-1 glide catheter was advanced over a 0.035 glidewire into the aortic arch. The above vessels were then  sequentially catheterized and cervical / cerebral angiograms taken. After review of images, the catheter was removed without incident. The 5-Fr sheath was then exchanged over the glidewire for an 8-Fr sheath. Under real-time fluoroscopy, the guide sheath was advanced over the select catheter and glidewire into the descending aorta. 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 The guide sheath was then advanced into the proximal right vertebral artery. The Select catheter was removed and the 070 guide catheter was coaxially introduced over both the XT-17 micro catheters and synchro select micro wires. The microcatheters were then advanced under roadmap guidance into the distal cervical vertebral artery. The guide catheter was then tracked over the microcatheter into the distal vertebral artery. The micro catheters were then further advanced into the proximal basilar artery. This allowed advancement of the guide catheter into the proximal basilar artery. Initially, multiple attempts were made to access the right posterior cerebral artery unsuccessfully. This included a combination of different shape microcatheter tip. I then attempted access with the Chikai 10 microwire again with multiple different tip configurations all unsuccessfully. At this point I elected to attempt to place coils within the aneurysm without the aid of the stent as it did not appear access to the right posterior cerebral artery would be feasible. The three-dimensional coil as well as several filling coils were placed into the aneurysm. There is a small amount of coil loop extending down near the origin of the right posterior cerebral  artery. As the last coil was being placed, some coil loops appear to be extending down further to words the origin of the posterior cerebral arteries and I therefore elected to stop coiling at this point. Angiogram was  taken and the micro catheters were withdrawn down into the guide catheter. Guide catheter was then withdrawn down into the cervical vertebral artery and angiogram was taken. This did reveal decreased flow through the right posterior cerebral artery suggesting some compromise of the ostium possibly due to coil mass. I therefore elected to attempt to place a stent by accessing the left vertebral artery. The guide catheter was removed, and the guide sheath was withdrawn down into the descending aorta as was the guide sheath. The select catheter was introduced over the Glidewire, and the left subclavian followed by the proximal left vertebral artery were selected. Guide sheath was then advanced into the proximal left vertebral artery. Under roadmap guidance, the 058 CT 5 guide catheter was introduced over and XT 17 microcatheter and synchro select microwire. These were then navigated until the CAT 5 guide catheter was in the proximal basilar artery. The synchro select microwire was then used to access the right posterior cerebral artery. I was able to gain access to this vessel by buttressing against the coil mass already placed in the aneurysm. Microcatheter was then tracked into the P2 segment. Microwire was removed, and the above stent was introduced. Stent was then deployed under roadmap guidance without incident. The microcatheter was then advanced over the pushing wire into the lumen of the stent. Angiogram was then taken. It did appear that flow through the right posterior cerebral artery was improved in comparison to the prior angiogram. With the posterior cerebral artery somewhat protected, we elected to attempt to coil more of the residual aneurysm. The microwire was introduced into the microcatheter, and advanced into the lumen of the aneurysm through the stent. Microcatheter was then advanced into the residual aneurysm. The remainder of the above coils were then sequentially placed. After several coils were  placed, I elected to complete the coiling procedure as I did not want to risk coil herniation through the tines of the stent and further compromise the posterior cerebral arteries. The coiling catheter was withdrawn, and the guide catheter was withdrawn down into the cervical vertebral artery. At this point, angiogram was taken which appear to reveal more decreased flow in the right posterior cerebral artery and what appear to be filling defect in the inferior wall of the right posterior cerebral artery adjacent to the coil mass. Therefore under roadmap guidance, I advanced the microcatheter over the microwire back into the lumen of the stent. I then administered a 8 mg bolus dose of Integrilin intra-arterially. After approximately 10 minutes, angiogram was taken which did reveal somewhat increased flow through the right posterior cerebral artery and better perfusion. I then administered another 8 mg bolus dose intra-arterially. After another 10 minutes, another angiogram was taken again revealing improved flow through the right posterior cerebral artery. At this point, the pharmacy was able to deliver intravenous Integrilin which was started at a rate of 2 micrograms/kg/minute with the plan to continue for 18 hours. With improved flow in the right posterior cerebral artery, I elected to complete the procedure at this point. The guide catheter and guide sheath were then synchronously removed without incident. FINDINGS: Right internal carotid, head: Injection reveals the presence of a widely patent ICA, M1, and A1 segments and their branches. No aneurysms, AVMs, or high-flow fistulas  are seen. Of note, the posterior communicating artery is very small and no visualization of the posterior circulation is noted. The parenchymal and venous phases are normal. The venous sinuses are widely patent. Left internal carotid, head: Injection reveals the presence of a widely patent ICA, A1, and M1 segments and their branches. No  aneurysms, AVMs, or high-flow fistulas are seen. Similar to the contralateral side, there is no significant visualization of the left posterior communicating artery. The parenchymal and venous phases are normal. The venous sinuses are widely patent. Left vertebral: Injection reveals the presence of a widely patent vertebral artery. This leads to a widely patent basilar artery that terminates in bilateral P1. 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 This aneurysm measures approximately 6.8 x 5.8 mm with a neck measuring approximately 4.5 mm. The parenchymal and venous phases are normal. The venous sinuses are widely patent. Right vertebral: The vertebral artery is widely patent. No PICA aneurysm is seen. See basilar description above. Right vertebral artery, head (during embolization) Angiograms taken during coiling of the basilar aneurysm through the right vertebral artery access demonstrate progressive occlusion primarily of the dome of the aneurysm. The right vertebral artery and bilateral posterior cerebral arteries appear to be widely patent. There is residual aneurysm both at the neck and centrally within the aneurysm. No filling defects are noted. Right vertebral artery, head (post coiling) Angiogram taken after coiling through the right vertebral artery reveal occlusion of the dome of the aneurysm although there is residual aneurysm centrally and at the neck. There are small coil loops which appear to be in proximity to the origin of the right posterior cerebral artery. No filling defects are seen however flow through the right posterior cerebral artery is delayed in comparison to the left PCA, as well as in comparison to earlier angiograms suggesting flow limitation. Left vertebral artery, head (pre stent) Angiogram taken through the guide catheter in the left vertebral artery again demonstrates coil  mass within the aneurysm with the residual filling as described above. Again seen is delayed filling of the right posterior cerebral artery with decreased perfusion in comparison to prior angiograms. Again, no filling defects are seen to suggest thrombus. Left vertebral artery, head (immediate post stent) Angiogram reveals widely patent left vertebral artery with stent extending from the mid basilar into the right P2 segment. No filling defects are seen. Flow within the right posterior cerebral artery appears to be improved in comparison to the pre stent angiogram with more symmetric filling of the bilateral posterior cerebral arteries. Left vertebral artery, head (during coiling) Angiogram reveals further coil mass within the residual aneurysm which is progressively occluded. No filling defects are noted. The posterior cerebral arteries remain patent. Left vertebral artery, head (immediate post coiling) Angiogram taken from the left vertebral artery reveals patent basilar artery. The left posterior cerebral artery remains widely patent. Stent is in stable position. There is coil mass within the aneurysm, with minimal neck residual. There is decreased flow through the right posterior cerebral artery with filling defect in the inferior wall of the right P1 adjacent to the distal aneurysm neck likely representing thrombus. There is decreased perfusion of the right posterior cerebral artery territory in comparison to the prior angiograms. Left vertebral artery, head (post thrombolysis) Angiograms taken 10 minutes after initial 8 mg bolus dose and taken again 10 minutes after the second 8 mg bolus dose sequentially reveal improvement  in flow through the right posterior cerebral artery. There is also much smaller filling defect along the wall of the posterior cerebral artery adjacent to the coil mass indicating dissolution of some of the thrombus. The final angiogram does show minimally decreased flow through the right  posterior cerebral artery with significantly improved perfusion of the right PCA territory in comparison to the previous angiograms. Venous sinuses remain widely patent. DISPOSITION: Upon completion of the study, the femoral sheath was secured in place with nylon suture and connected to a pressure system. The patient was extubated and transferred to the postanesthesia care unit in stable hemodynamic condition. IMPRESSION: 1. 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 Procedure was complicated by stent thrombosis treated with intra arterial and intravenous Integrilin. The preliminary results of this procedure were shared with the patient's family. Electronically Signed   By: Consuella Lose   On: 11/19/2020 20:28   IR Angiogram Follow Up Study  Result Date: 11/19/2020 PROCEDURE: DIAGNOSTIC CEREBRAL ANGIOGRAM COIL EMBOLIZATION OF BASILAR ANEURYSM HISTORY: The patient is a 60 year old woman presenting to the emergency department with several episodes of diplopia and approximally 1 week of significantly worsening headache. 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 She was therefore admitted to the hospital. After review of CTA it was felt that she would likely be a candidate for stent supported coiling. It was felt that her aneurysm was likely the cause of her new symptoms likely due to enlargement and irritation of the oculomotor nerve. Patient was loaded with 300 mg of Plavix and 325 mg of aspirin. ACCESS: The technical aspects of the procedure as well as its potential risks and benefits were reviewed with the patient and patient's family. These risks included but were not limited to stroke, intracranial hemorrhage, bleeding, infection, allergic reaction, damage to organs or vital structures, stroke, non-diagnostic procedure, and the catastrophic  outcomes of heart attack, coma, and death. With an understanding of these risks, informed consent was obtained and witnessed. The patient was placed in the supine position on the angiography table and the skin of right groin prepped in the usual sterile fashion. The procedure was performed under general anesthesia. A 5-French sheath was introduced in the right common femoral artery using Seldinger technique. MEDICATIONS: HEPARIN: 8000 Units total. INTEGRILIN: 16mg  IA, 39mcg/kg/min IV continuous infusion CONTRAST:  63mL OMNIPAQUE IOHEXOL 300 MG/ML SOLN, 2mL OMNIPAQUE IOHEXOL 300 MG/ML SOLN, 1mL OMNIPAQUE IOHEXOL 300 MG/ML SOLNcc, Omnipaque 300 FLUOROSCOPY TIME:  FLUOROSCOPY TIME: See IR records TECHNIQUE: CATHETERS AND WIRES 5-French JB-1 catheter 180 cm 0.035" glidewire 6-French NeuronMax guide sheath 6-French Berenstein Select JB-1 catheter 0.070 Sofia guide catheter 0.058" CatV guidecatheter Synchro select standard microwire Synchro select soft microwire Asahi Chikai 10 microwire Excelsior XT-17 microcatheter x 2 COILS/STENTS USED Target 3D 6 mm x 15 cm Target 360 XL soft 5 mm by 10 cm Target 360 XL 4 mm x 12 cm Target 360 XL 3 mm x 9 cm not deployed Target 360 ultra 2 mm x 4 cm Target 360 ultra 2.5 mm x 4 cm x2 Target 360 ultra 2 mm x 4 cm x2 Neuroform Atlas 3.0 mm x 24 mm VESSELS CATHETERIZED Right internal carotid Left internal carotid Left vertebral Right vertebral Right common femoral Basilar artery Right posterior cerebral artery VESSELS STUDIED Right internal carotid, head Left internal carotid, head Left vertebral Right vertebral Right vertebral artery, head (during embolization)  Right vertebral artery, head (post-coiling) Left vertebral artery, head (pre stent) Left vertebral artery, head (immediate post stent) Left vertebral artery, head (during coiling) Left vertebral artery, head (immediate post coiling) Left vertebral artery, head (post thrombolysis) PROCEDURAL NARRATIVE A 5-Fr JB-1 glide catheter  was advanced over a 0.035 glidewire into the aortic arch. The above vessels were then sequentially catheterized and cervical / cerebral angiograms taken. After review of images, the catheter was removed without incident. The 5-Fr sheath was then exchanged over the glidewire for an 8-Fr sheath. Under real-time fluoroscopy, the guide sheath was advanced over the select catheter and glidewire into the descending aorta. 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 The guide sheath was then advanced into the proximal right vertebral artery. The Select catheter was removed and the 070 guide catheter was coaxially introduced over both the XT-17 micro catheters and synchro select micro wires. The microcatheters were then advanced under roadmap guidance into the distal cervical vertebral artery. The guide catheter was then tracked over the microcatheter into the distal vertebral artery. The micro catheters were then further advanced into the proximal basilar artery. This allowed advancement of the guide catheter into the proximal basilar artery. Initially, multiple attempts were made to access the right posterior cerebral artery unsuccessfully. This included a combination of different shape microcatheter tip. I then attempted access with the Chikai 10 microwire again with multiple different tip configurations all unsuccessfully. At this point I elected to attempt to place coils within the aneurysm without the aid of the stent as it did not appear access to the right posterior cerebral artery would be feasible. The three-dimensional coil as well as several filling coils were placed into the aneurysm. There is a small amount of coil loop extending down near the origin of the right posterior cerebral artery. As the last coil was being placed, some coil loops appear to be extending down further to words the origin of the posterior  cerebral arteries and I therefore elected to stop coiling at this point. Angiogram was taken and the micro catheters were withdrawn down into the guide catheter. Guide catheter was then withdrawn down into the cervical vertebral artery and angiogram was taken. This did reveal decreased flow through the right posterior cerebral artery suggesting some compromise of the ostium possibly due to coil mass. I therefore elected to attempt to place a stent by accessing the left vertebral artery. The guide catheter was removed, and the guide sheath was withdrawn down into the descending aorta as was the guide sheath. The select catheter was introduced over the Glidewire, and the left subclavian followed by the proximal left vertebral artery were selected. Guide sheath was then advanced into the proximal left vertebral artery. Under roadmap guidance, the 058 CT 5 guide catheter was introduced over and XT 17 microcatheter and synchro select microwire. These were then navigated until the CAT 5 guide catheter was in the proximal basilar artery. The synchro select microwire was then used to access the right posterior cerebral artery. I was able to gain access to this vessel by buttressing against the coil mass already placed in the aneurysm. Microcatheter was then tracked into the P2 segment. Microwire was removed, and the above stent was introduced. Stent was then deployed under roadmap guidance without incident. The microcatheter was then advanced over the pushing wire into the lumen of the stent. Angiogram was then taken. It did appear that flow through the  right posterior cerebral artery was improved in comparison to the prior angiogram. With the posterior cerebral artery somewhat protected, we elected to attempt to coil more of the residual aneurysm. The microwire was introduced into the microcatheter, and advanced into the lumen of the aneurysm through the stent. Microcatheter was then advanced into the residual aneurysm.  The remainder of the above coils were then sequentially placed. After several coils were placed, I elected to complete the coiling procedure as I did not want to risk coil herniation through the tines of the stent and further compromise the posterior cerebral arteries. The coiling catheter was withdrawn, and the guide catheter was withdrawn down into the cervical vertebral artery. At this point, angiogram was taken which appear to reveal more decreased flow in the right posterior cerebral artery and what appear to be filling defect in the inferior wall of the right posterior cerebral artery adjacent to the coil mass. Therefore under roadmap guidance, I advanced the microcatheter over the microwire back into the lumen of the stent. I then administered a 8 mg bolus dose of Integrilin intra-arterially. After approximately 10 minutes, angiogram was taken which did reveal somewhat increased flow through the right posterior cerebral artery and better perfusion. I then administered another 8 mg bolus dose intra-arterially. After another 10 minutes, another angiogram was taken again revealing improved flow through the right posterior cerebral artery. At this point, the pharmacy was able to deliver intravenous Integrilin which was started at a rate of 2 micrograms/kg/minute with the plan to continue for 18 hours. With improved flow in the right posterior cerebral artery, I elected to complete the procedure at this point. The guide catheter and guide sheath were then synchronously removed without incident. FINDINGS: Right internal carotid, head: Injection reveals the presence of a widely patent ICA, M1, and A1 segments and their branches. No aneurysms, AVMs, or high-flow fistulas are seen. Of note, the posterior communicating artery is very small and no visualization of the posterior circulation is noted. The parenchymal and venous phases are normal. The venous sinuses are widely patent. Left internal carotid, head: Injection  reveals the presence of a widely patent ICA, A1, and M1 segments and their branches. No aneurysms, AVMs, or high-flow fistulas are seen. Similar to the contralateral side, there is no significant visualization of the left posterior communicating artery. The parenchymal and venous phases are normal. The venous sinuses are widely patent. Left vertebral: Injection reveals the presence of a widely patent vertebral artery. This leads to a widely patent basilar artery that terminates in bilateral P1. 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 This aneurysm measures approximately 6.8 x 5.8 mm with a neck measuring approximately 4.5 mm. The parenchymal and venous phases are normal. The venous sinuses are widely patent. Right vertebral: The vertebral artery is widely patent. No PICA aneurysm is seen. See basilar description above. Right vertebral artery, head (during embolization) Angiograms taken during coiling of the basilar aneurysm through the right vertebral artery access demonstrate progressive occlusion primarily of the dome of the aneurysm. The right vertebral artery and bilateral posterior cerebral arteries appear to be widely patent. There is residual aneurysm both at the neck and centrally within the aneurysm. No filling defects are noted. Right vertebral artery, head (post coiling) Angiogram taken after coiling through the right vertebral artery reveal occlusion of the dome of the aneurysm although there is residual aneurysm centrally and at the  neck. There are small coil loops which appear to be in proximity to the origin of the right posterior cerebral artery. No filling defects are seen however flow through the right posterior cerebral artery is delayed in comparison to the left PCA, as well as in comparison to earlier angiograms suggesting flow limitation. Left vertebral artery, head (pre stent)  Angiogram taken through the guide catheter in the left vertebral artery again demonstrates coil mass within the aneurysm with the residual filling as described above. Again seen is delayed filling of the right posterior cerebral artery with decreased perfusion in comparison to prior angiograms. Again, no filling defects are seen to suggest thrombus. Left vertebral artery, head (immediate post stent) Angiogram reveals widely patent left vertebral artery with stent extending from the mid basilar into the right P2 segment. No filling defects are seen. Flow within the right posterior cerebral artery appears to be improved in comparison to the pre stent angiogram with more symmetric filling of the bilateral posterior cerebral arteries. Left vertebral artery, head (during coiling) Angiogram reveals further coil mass within the residual aneurysm which is progressively occluded. No filling defects are noted. The posterior cerebral arteries remain patent. Left vertebral artery, head (immediate post coiling) Angiogram taken from the left vertebral artery reveals patent basilar artery. The left posterior cerebral artery remains widely patent. Stent is in stable position. There is coil mass within the aneurysm, with minimal neck residual. There is decreased flow through the right posterior cerebral artery with filling defect in the inferior wall of the right P1 adjacent to the distal aneurysm neck likely representing thrombus. There is decreased perfusion of the right posterior cerebral artery territory in comparison to the prior angiograms. Left vertebral artery, head (post thrombolysis) Angiograms taken 10 minutes after initial 8 mg bolus dose and taken again 10 minutes after the second 8 mg bolus dose sequentially reveal improvement in flow through the right posterior cerebral artery. There is also much smaller filling defect along the wall of the posterior cerebral artery adjacent to the coil mass indicating dissolution of  some of the thrombus. The final angiogram does show minimally decreased flow through the right posterior cerebral artery with significantly improved perfusion of the right PCA territory in comparison to the previous angiograms. Venous sinuses remain widely patent. DISPOSITION: Upon completion of the study, the femoral sheath was secured in place with nylon suture and connected to a pressure system. The patient was extubated and transferred to the postanesthesia care unit in stable hemodynamic condition. IMPRESSION: 1. 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 Procedure was complicated by stent thrombosis treated with intra arterial and intravenous Integrilin. The preliminary results of this procedure were shared with the patient's family. Electronically Signed   By: Consuella Lose   On: 11/19/2020 20:28   IR Angiogram Follow Up Study  Result Date: 11/19/2020 PROCEDURE: DIAGNOSTIC CEREBRAL ANGIOGRAM COIL EMBOLIZATION OF BASILAR ANEURYSM HISTORY: The patient is a 60 year old woman presenting to the emergency department with several episodes of diplopia and approximally 1 week of significantly worsening headache. 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 She was therefore admitted to the hospital. After review of CTA it was felt that she would likely be a candidate for stent supported coiling. It was felt that her aneurysm was likely the cause of her new symptoms likely due to enlargement and irritation of the oculomotor nerve. Patient was  loaded with 300 mg of Plavix and 325 mg of aspirin. ACCESS: The technical aspects of the procedure as well as its potential risks and benefits were reviewed with the patient and patient's family. These risks included but were not limited to stroke, intracranial hemorrhage, bleeding, infection, allergic  reaction, damage to organs or vital structures, stroke, non-diagnostic procedure, and the catastrophic outcomes of heart attack, coma, and death. With an understanding of these risks, informed consent was obtained and witnessed. The patient was placed in the supine position on the angiography table and the skin of right groin prepped in the usual sterile fashion. The procedure was performed under general anesthesia. A 5-French sheath was introduced in the right common femoral artery using Seldinger technique. MEDICATIONS: HEPARIN: 8000 Units total. INTEGRILIN: 16mg  IA, 97mcg/kg/min IV continuous infusion CONTRAST:  70mL OMNIPAQUE IOHEXOL 300 MG/ML SOLN, 17mL OMNIPAQUE IOHEXOL 300 MG/ML SOLN, 81mL OMNIPAQUE IOHEXOL 300 MG/ML SOLNcc, Omnipaque 300 FLUOROSCOPY TIME:  FLUOROSCOPY TIME: See IR records TECHNIQUE: CATHETERS AND WIRES 5-French JB-1 catheter 180 cm 0.035" glidewire 6-French NeuronMax guide sheath 6-French Berenstein Select JB-1 catheter 0.070 Sofia guide catheter 0.058" CatV guidecatheter Synchro select standard microwire Synchro select soft microwire Asahi Chikai 10 microwire Excelsior XT-17 microcatheter x 2 COILS/STENTS USED Target 3D 6 mm x 15 cm Target 360 XL soft 5 mm by 10 cm Target 360 XL 4 mm x 12 cm Target 360 XL 3 mm x 9 cm not deployed Target 360 ultra 2 mm x 4 cm Target 360 ultra 2.5 mm x 4 cm x2 Target 360 ultra 2 mm x 4 cm x2 Neuroform Atlas 3.0 mm x 24 mm VESSELS CATHETERIZED Right internal carotid Left internal carotid Left vertebral Right vertebral Right common femoral Basilar artery Right posterior cerebral artery VESSELS STUDIED Right internal carotid, head Left internal carotid, head Left vertebral Right vertebral Right vertebral artery, head (during embolization) Right vertebral artery, head (post-coiling) Left vertebral artery, head (pre stent) Left vertebral artery, head (immediate post stent) Left vertebral artery, head (during coiling) Left vertebral artery, head (immediate post  coiling) Left vertebral artery, head (post thrombolysis) PROCEDURAL NARRATIVE A 5-Fr JB-1 glide catheter was advanced over a 0.035 glidewire into the aortic arch. The above vessels were then sequentially catheterized and cervical / cerebral angiograms taken. After review of images, the catheter was removed without incident. The 5-Fr sheath was then exchanged over the glidewire for an 8-Fr sheath. Under real-time fluoroscopy, the guide sheath was advanced over the select catheter and glidewire into the descending aorta. 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 The guide sheath was then advanced into the proximal right vertebral artery. The Select catheter was removed and the 070 guide catheter was coaxially introduced over both the XT-17 micro catheters and synchro select micro wires. The microcatheters were then advanced under roadmap guidance into the distal cervical vertebral artery. The guide catheter was then tracked over the microcatheter into the distal vertebral artery. The micro catheters were then further advanced into the proximal basilar artery. This allowed advancement of the guide catheter into the proximal basilar artery. Initially, multiple attempts were made to access the right posterior cerebral artery unsuccessfully. This included a combination of different shape microcatheter tip. I then attempted access with the Chikai 10 microwire again with multiple different tip configurations all unsuccessfully. At this point I elected to attempt to place coils within the aneurysm without the aid of the stent as it did  not appear access to the right posterior cerebral artery would be feasible. The three-dimensional coil as well as several filling coils were placed into the aneurysm. There is a small amount of coil loop extending down near the origin of the right posterior cerebral artery. As the last coil was  being placed, some coil loops appear to be extending down further to words the origin of the posterior cerebral arteries and I therefore elected to stop coiling at this point. Angiogram was taken and the micro catheters were withdrawn down into the guide catheter. Guide catheter was then withdrawn down into the cervical vertebral artery and angiogram was taken. This did reveal decreased flow through the right posterior cerebral artery suggesting some compromise of the ostium possibly due to coil mass. I therefore elected to attempt to place a stent by accessing the left vertebral artery. The guide catheter was removed, and the guide sheath was withdrawn down into the descending aorta as was the guide sheath. The select catheter was introduced over the Glidewire, and the left subclavian followed by the proximal left vertebral artery were selected. Guide sheath was then advanced into the proximal left vertebral artery. Under roadmap guidance, the 058 CT 5 guide catheter was introduced over and XT 17 microcatheter and synchro select microwire. These were then navigated until the CAT 5 guide catheter was in the proximal basilar artery. The synchro select microwire was then used to access the right posterior cerebral artery. I was able to gain access to this vessel by buttressing against the coil mass already placed in the aneurysm. Microcatheter was then tracked into the P2 segment. Microwire was removed, and the above stent was introduced. Stent was then deployed under roadmap guidance without incident. The microcatheter was then advanced over the pushing wire into the lumen of the stent. Angiogram was then taken. It did appear that flow through the right posterior cerebral artery was improved in comparison to the prior angiogram. With the posterior cerebral artery somewhat protected, we elected to attempt to coil more of the residual aneurysm. The microwire was introduced into the microcatheter, and advanced into the  lumen of the aneurysm through the stent. Microcatheter was then advanced into the residual aneurysm. The remainder of the above coils were then sequentially placed. After several coils were placed, I elected to complete the coiling procedure as I did not want to risk coil herniation through the tines of the stent and further compromise the posterior cerebral arteries. The coiling catheter was withdrawn, and the guide catheter was withdrawn down into the cervical vertebral artery. At this point, angiogram was taken which appear to reveal more decreased flow in the right posterior cerebral artery and what appear to be filling defect in the inferior wall of the right posterior cerebral artery adjacent to the coil mass. Therefore under roadmap guidance, I advanced the microcatheter over the microwire back into the lumen of the stent. I then administered a 8 mg bolus dose of Integrilin intra-arterially. After approximately 10 minutes, angiogram was taken which did reveal somewhat increased flow through the right posterior cerebral artery and better perfusion. I then administered another 8 mg bolus dose intra-arterially. After another 10 minutes, another angiogram was taken again revealing improved flow through the right posterior cerebral artery. At this point, the pharmacy was able to deliver intravenous Integrilin which was started at a rate of 2 micrograms/kg/minute with the plan to continue for 18 hours. With improved flow in the right posterior cerebral artery, I elected to complete  the procedure at this point. The guide catheter and guide sheath were then synchronously removed without incident. FINDINGS: Right internal carotid, head: Injection reveals the presence of a widely patent ICA, M1, and A1 segments and their branches. No aneurysms, AVMs, or high-flow fistulas are seen. Of note, the posterior communicating artery is very small and no visualization of the posterior circulation is noted. The parenchymal and  venous phases are normal. The venous sinuses are widely patent. Left internal carotid, head: Injection reveals the presence of a widely patent ICA, A1, and M1 segments and their branches. No aneurysms, AVMs, or high-flow fistulas are seen. Similar to the contralateral side, there is no significant visualization of the left posterior communicating artery. The parenchymal and venous phases are normal. The venous sinuses are widely patent. Left vertebral: Injection reveals the presence of a widely patent vertebral artery. This leads to a widely patent basilar artery that terminates in bilateral P1. 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 This aneurysm measures approximately 6.8 x 5.8 mm with a neck measuring approximately 4.5 mm. The parenchymal and venous phases are normal. The venous sinuses are widely patent. Right vertebral: The vertebral artery is widely patent. No PICA aneurysm is seen. See basilar description above. Right vertebral artery, head (during embolization) Angiograms taken during coiling of the basilar aneurysm through the right vertebral artery access demonstrate progressive occlusion primarily of the dome of the aneurysm. The right vertebral artery and bilateral posterior cerebral arteries appear to be widely patent. There is residual aneurysm both at the neck and centrally within the aneurysm. No filling defects are noted. Right vertebral artery, head (post coiling) Angiogram taken after coiling through the right vertebral artery reveal occlusion of the dome of the aneurysm although there is residual aneurysm centrally and at the neck. There are small coil loops which appear to be in proximity to the origin of the right posterior cerebral artery. No filling defects are seen however flow through the right posterior cerebral artery is delayed in comparison to the left PCA, as well as in  comparison to earlier angiograms suggesting flow limitation. Left vertebral artery, head (pre stent) Angiogram taken through the guide catheter in the left vertebral artery again demonstrates coil mass within the aneurysm with the residual filling as described above. Again seen is delayed filling of the right posterior cerebral artery with decreased perfusion in comparison to prior angiograms. Again, no filling defects are seen to suggest thrombus. Left vertebral artery, head (immediate post stent) Angiogram reveals widely patent left vertebral artery with stent extending from the mid basilar into the right P2 segment. No filling defects are seen. Flow within the right posterior cerebral artery appears to be improved in comparison to the pre stent angiogram with more symmetric filling of the bilateral posterior cerebral arteries. Left vertebral artery, head (during coiling) Angiogram reveals further coil mass within the residual aneurysm which is progressively occluded. No filling defects are noted. The posterior cerebral arteries remain patent. Left vertebral artery, head (immediate post coiling) Angiogram taken from the left vertebral artery reveals patent basilar artery. The left posterior cerebral artery remains widely patent. Stent is in stable position. There is coil mass within the aneurysm, with minimal neck residual. There is decreased flow through the right posterior cerebral artery with filling defect in the inferior wall of the right P1 adjacent to the distal aneurysm neck likely representing thrombus. There is decreased perfusion  of the right posterior cerebral artery territory in comparison to the prior angiograms. Left vertebral artery, head (post thrombolysis) Angiograms taken 10 minutes after initial 8 mg bolus dose and taken again 10 minutes after the second 8 mg bolus dose sequentially reveal improvement in flow through the right posterior cerebral artery. There is also much smaller filling defect  along the wall of the posterior cerebral artery adjacent to the coil mass indicating dissolution of some of the thrombus. The final angiogram does show minimally decreased flow through the right posterior cerebral artery with significantly improved perfusion of the right PCA territory in comparison to the previous angiograms. Venous sinuses remain widely patent. DISPOSITION: Upon completion of the study, the femoral sheath was secured in place with nylon suture and connected to a pressure system. The patient was extubated and transferred to the postanesthesia care unit in stable hemodynamic condition. IMPRESSION: 1. 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 Procedure was complicated by stent thrombosis treated with intra arterial and intravenous Integrilin. The preliminary results of this procedure were shared with the patient's family. Electronically Signed   By: Consuella Lose   On: 11/19/2020 20:28   IR Angiogram Follow Up Study  Result Date: 11/19/2020 PROCEDURE: DIAGNOSTIC CEREBRAL ANGIOGRAM COIL EMBOLIZATION OF BASILAR ANEURYSM HISTORY: The patient is a 60 year old woman presenting to the emergency department with several episodes of diplopia and approximally 1 week of significantly worsening headache. 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 She was therefore admitted to the hospital. After review of CTA it was felt that she would likely be a candidate for stent supported coiling. It was felt that her aneurysm was likely the cause of her new symptoms likely due to enlargement and irritation of the oculomotor nerve. Patient was loaded with 300 mg of Plavix and 325 mg of aspirin. ACCESS: The technical aspects of the procedure as well as its potential risks and benefits were reviewed with the patient and patient's family. These risks  included but were not limited to stroke, intracranial hemorrhage, bleeding, infection, allergic reaction, damage to organs or vital structures, stroke, non-diagnostic procedure, and the catastrophic outcomes of heart attack, coma, and death. With an understanding of these risks, informed consent was obtained and witnessed. The patient was placed in the supine position on the angiography table and the skin of right groin prepped in the usual sterile fashion. The procedure was performed under general anesthesia. A 5-French sheath was introduced in the right common femoral artery using Seldinger technique. MEDICATIONS: HEPARIN: 8000 Units total. INTEGRILIN: 16mg  IA, 76mcg/kg/min IV continuous infusion CONTRAST:  50mL OMNIPAQUE IOHEXOL 300 MG/ML SOLN, 24mL OMNIPAQUE IOHEXOL 300 MG/ML SOLN, 4mL OMNIPAQUE IOHEXOL 300 MG/ML SOLNcc, Omnipaque 300 FLUOROSCOPY TIME:  FLUOROSCOPY TIME: See IR records TECHNIQUE: CATHETERS AND WIRES 5-French JB-1 catheter 180 cm 0.035" glidewire 6-French NeuronMax guide sheath 6-French Berenstein Select JB-1 catheter 0.070 Sofia guide catheter 0.058" CatV guidecatheter Synchro select standard microwire Synchro select soft microwire Asahi Chikai 10 microwire Excelsior XT-17 microcatheter x 2 COILS/STENTS USED Target 3D 6 mm x 15 cm Target 360 XL soft 5 mm by 10 cm Target 360 XL 4 mm x 12 cm Target 360 XL 3 mm x 9 cm not deployed Target 360 ultra 2 mm x 4 cm Target 360 ultra 2.5 mm x 4 cm x2 Target 360 ultra 2 mm x 4 cm x2 Neuroform Atlas 3.0 mm  x 24 mm VESSELS CATHETERIZED Right internal carotid Left internal carotid Left vertebral Right vertebral Right common femoral Basilar artery Right posterior cerebral artery VESSELS STUDIED Right internal carotid, head Left internal carotid, head Left vertebral Right vertebral Right vertebral artery, head (during embolization) Right vertebral artery, head (post-coiling) Left vertebral artery, head (pre stent) Left vertebral artery, head (immediate post  stent) Left vertebral artery, head (during coiling) Left vertebral artery, head (immediate post coiling) Left vertebral artery, head (post thrombolysis) PROCEDURAL NARRATIVE A 5-Fr JB-1 glide catheter was advanced over a 0.035 glidewire into the aortic arch. The above vessels were then sequentially catheterized and cervical / cerebral angiograms taken. After review of images, the catheter was removed without incident. The 5-Fr sheath was then exchanged over the glidewire for an 8-Fr sheath. Under real-time fluoroscopy, the guide sheath was advanced over the select catheter and glidewire into the descending aorta. 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 The guide sheath was then advanced into the proximal right vertebral artery. The Select catheter was removed and the 070 guide catheter was coaxially introduced over both the XT-17 micro catheters and synchro select micro wires. The microcatheters were then advanced under roadmap guidance into the distal cervical vertebral artery. The guide catheter was then tracked over the microcatheter into the distal vertebral artery. The micro catheters were then further advanced into the proximal basilar artery. This allowed advancement of the guide catheter into the proximal basilar artery. Initially, multiple attempts were made to access the right posterior cerebral artery unsuccessfully. This included a combination of different shape microcatheter tip. I then attempted access with the Chikai 10 microwire again with multiple different tip configurations all unsuccessfully. At this point I elected to attempt to place coils within the aneurysm without the aid of the stent as it did not appear access to the right posterior cerebral artery would be feasible. The three-dimensional coil as well as several filling coils were placed into the aneurysm. There is a small amount of coil  loop extending down near the origin of the right posterior cerebral artery. As the last coil was being placed, some coil loops appear to be extending down further to words the origin of the posterior cerebral arteries and I therefore elected to stop coiling at this point. Angiogram was taken and the micro catheters were withdrawn down into the guide catheter. Guide catheter was then withdrawn down into the cervical vertebral artery and angiogram was taken. This did reveal decreased flow through the right posterior cerebral artery suggesting some compromise of the ostium possibly due to coil mass. I therefore elected to attempt to place a stent by accessing the left vertebral artery. The guide catheter was removed, and the guide sheath was withdrawn down into the descending aorta as was the guide sheath. The select catheter was introduced over the Glidewire, and the left subclavian followed by the proximal left vertebral artery were selected. Guide sheath was then advanced into the proximal left vertebral artery. Under roadmap guidance, the 058 CT 5 guide catheter was introduced over and XT 17 microcatheter and synchro select microwire. These were then navigated until the CAT 5 guide catheter was in the proximal basilar artery. The synchro select microwire was then used to access the right posterior cerebral artery. I was able to gain access to this vessel by buttressing against the coil mass already placed in the aneurysm. Microcatheter was then tracked into the P2 segment.  Microwire was removed, and the above stent was introduced. Stent was then deployed under roadmap guidance without incident. The microcatheter was then advanced over the pushing wire into the lumen of the stent. Angiogram was then taken. It did appear that flow through the right posterior cerebral artery was improved in comparison to the prior angiogram. With the posterior cerebral artery somewhat protected, we elected to attempt to coil more of  the residual aneurysm. The microwire was introduced into the microcatheter, and advanced into the lumen of the aneurysm through the stent. Microcatheter was then advanced into the residual aneurysm. The remainder of the above coils were then sequentially placed. After several coils were placed, I elected to complete the coiling procedure as I did not want to risk coil herniation through the tines of the stent and further compromise the posterior cerebral arteries. The coiling catheter was withdrawn, and the guide catheter was withdrawn down into the cervical vertebral artery. At this point, angiogram was taken which appear to reveal more decreased flow in the right posterior cerebral artery and what appear to be filling defect in the inferior wall of the right posterior cerebral artery adjacent to the coil mass. Therefore under roadmap guidance, I advanced the microcatheter over the microwire back into the lumen of the stent. I then administered a 8 mg bolus dose of Integrilin intra-arterially. After approximately 10 minutes, angiogram was taken which did reveal somewhat increased flow through the right posterior cerebral artery and better perfusion. I then administered another 8 mg bolus dose intra-arterially. After another 10 minutes, another angiogram was taken again revealing improved flow through the right posterior cerebral artery. At this point, the pharmacy was able to deliver intravenous Integrilin which was started at a rate of 2 micrograms/kg/minute with the plan to continue for 18 hours. With improved flow in the right posterior cerebral artery, I elected to complete the procedure at this point. The guide catheter and guide sheath were then synchronously removed without incident. FINDINGS: Right internal carotid, head: Injection reveals the presence of a widely patent ICA, M1, and A1 segments and their branches. No aneurysms, AVMs, or high-flow fistulas are seen. Of note, the posterior communicating  artery is very small and no visualization of the posterior circulation is noted. The parenchymal and venous phases are normal. The venous sinuses are widely patent. Left internal carotid, head: Injection reveals the presence of a widely patent ICA, A1, and M1 segments and their branches. No aneurysms, AVMs, or high-flow fistulas are seen. Similar to the contralateral side, there is no significant visualization of the left posterior communicating artery. The parenchymal and venous phases are normal. The venous sinuses are widely patent. Left vertebral: Injection reveals the presence of a widely patent vertebral artery. This leads to a widely patent basilar artery that terminates in bilateral P1. 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 This aneurysm measures approximately 6.8 x 5.8 mm with a neck measuring approximately 4.5 mm. The parenchymal and venous phases are normal. The venous sinuses are widely patent. Right vertebral: The vertebral artery is widely patent. No PICA aneurysm is seen. See basilar description above. Right vertebral artery, head (during embolization) Angiograms taken during coiling of the basilar aneurysm through the right vertebral artery access demonstrate progressive occlusion primarily of the dome of the aneurysm. The right vertebral artery and bilateral posterior cerebral arteries appear to be widely patent. There is residual aneurysm both at  the neck and centrally within the aneurysm. No filling defects are noted. Right vertebral artery, head (post coiling) Angiogram taken after coiling through the right vertebral artery reveal occlusion of the dome of the aneurysm although there is residual aneurysm centrally and at the neck. There are small coil loops which appear to be in proximity to the origin of the right posterior cerebral artery. No filling defects are seen however flow  through the right posterior cerebral artery is delayed in comparison to the left PCA, as well as in comparison to earlier angiograms suggesting flow limitation. Left vertebral artery, head (pre stent) Angiogram taken through the guide catheter in the left vertebral artery again demonstrates coil mass within the aneurysm with the residual filling as described above. Again seen is delayed filling of the right posterior cerebral artery with decreased perfusion in comparison to prior angiograms. Again, no filling defects are seen to suggest thrombus. Left vertebral artery, head (immediate post stent) Angiogram reveals widely patent left vertebral artery with stent extending from the mid basilar into the right P2 segment. No filling defects are seen. Flow within the right posterior cerebral artery appears to be improved in comparison to the pre stent angiogram with more symmetric filling of the bilateral posterior cerebral arteries. Left vertebral artery, head (during coiling) Angiogram reveals further coil mass within the residual aneurysm which is progressively occluded. No filling defects are noted. The posterior cerebral arteries remain patent. Left vertebral artery, head (immediate post coiling) Angiogram taken from the left vertebral artery reveals patent basilar artery. The left posterior cerebral artery remains widely patent. Stent is in stable position. There is coil mass within the aneurysm, with minimal neck residual. There is decreased flow through the right posterior cerebral artery with filling defect in the inferior wall of the right P1 adjacent to the distal aneurysm neck likely representing thrombus. There is decreased perfusion of the right posterior cerebral artery territory in comparison to the prior angiograms. Left vertebral artery, head (post thrombolysis) Angiograms taken 10 minutes after initial 8 mg bolus dose and taken again 10 minutes after the second 8 mg bolus dose sequentially reveal  improvement in flow through the right posterior cerebral artery. There is also much smaller filling defect along the wall of the posterior cerebral artery adjacent to the coil mass indicating dissolution of some of the thrombus. The final angiogram does show minimally decreased flow through the right posterior cerebral artery with significantly improved perfusion of the right PCA territory in comparison to the previous angiograms. Venous sinuses remain widely patent. DISPOSITION: Upon completion of the study, the femoral sheath was secured in place with nylon suture and connected to a pressure system. The patient was extubated and transferred to the postanesthesia care unit in stable hemodynamic condition. IMPRESSION: 1. 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 Procedure was complicated by stent thrombosis treated with intra arterial and intravenous Integrilin. The preliminary results of this procedure were shared with the patient's family. Electronically Signed   By: Consuella Lose   On: 11/19/2020 20:28   IR Angiogram Follow Up Study  Result Date: 11/19/2020 PROCEDURE: DIAGNOSTIC CEREBRAL ANGIOGRAM COIL EMBOLIZATION OF BASILAR ANEURYSM HISTORY: The patient is a 60 year old woman presenting to the emergency department with several episodes of diplopia and approximally 1 week of significantly worsening headache. 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 She was therefore admitted to the hospital.  After review of CTA it was felt that she would likely be a candidate for stent supported coiling. It was felt that her aneurysm was likely the cause of her new symptoms likely due to enlargement and irritation of the oculomotor nerve. Patient was loaded with 300 mg of Plavix and 325 mg of aspirin. ACCESS: The technical aspects of the procedure as  well as its potential risks and benefits were reviewed with the patient and patient's family. These risks included but were not limited to stroke, intracranial hemorrhage, bleeding, infection, allergic reaction, damage to organs or vital structures, stroke, non-diagnostic procedure, and the catastrophic outcomes of heart attack, coma, and death. With an understanding of these risks, informed consent was obtained and witnessed. The patient was placed in the supine position on the angiography table and the skin of right groin prepped in the usual sterile fashion. The procedure was performed under general anesthesia. A 5-French sheath was introduced in the right common femoral artery using Seldinger technique. MEDICATIONS: HEPARIN: 8000 Units total. INTEGRILIN: 16mg  IA, 45mcg/kg/min IV continuous infusion CONTRAST:  59mL OMNIPAQUE IOHEXOL 300 MG/ML SOLN, 59mL OMNIPAQUE IOHEXOL 300 MG/ML SOLN, 7mL OMNIPAQUE IOHEXOL 300 MG/ML SOLNcc, Omnipaque 300 FLUOROSCOPY TIME:  FLUOROSCOPY TIME: See IR records TECHNIQUE: CATHETERS AND WIRES 5-French JB-1 catheter 180 cm 0.035" glidewire 6-French NeuronMax guide sheath 6-French Berenstein Select JB-1 catheter 0.070 Sofia guide catheter 0.058" CatV guidecatheter Synchro select standard microwire Synchro select soft microwire As[employer] Chikai 10 microwire Excelsior XT-17 microcatheter x 2 COILS/STENTS USED Target 3D 6 mm x 15 cm Target 360 XL soft 5 mm by 10 cm Target 360 XL 4 mm x 12 cm Target 360 XL 3 mm x 9 cm not deployed Target 360 ultra 2 mm x 4 cm Target 360 ultra 2.5 mm x 4 cm x2 Target 360 ultra 2 mm x 4 cm x2 Neuroform Atlas 3.0 mm x 24 mm VESSELS CATHETERIZED Right internal carotid Left internal carotid Left vertebral Right vertebral Right common femoral Basilar artery Right posterior cerebral artery VESSELS STUDIED Right internal carotid, head Left internal carotid, head Left vertebral Right vertebral Right vertebral artery, head (during embolization) Right vertebral artery,  head (post-coiling) Left vertebral artery, head (pre stent) Left vertebral artery, head (immediate post stent) Left vertebral artery, head (during coiling) Left vertebral artery, head (immediate post coiling) Left vertebral artery, head (post thrombolysis) PROCEDURAL NARRATIVE A 5-Fr JB-1 glide catheter was advanced over a 0.035 glidewire into the aortic arch. The above vessels were then sequentially catheterized and cervical / cerebral angiograms taken. After review of images, the catheter was removed without incident. The 5-Fr sheath was then exchanged over the glidewire for an 8-Fr sheath. Under real-time fluoroscopy, the guide sheath was advanced over the select catheter and glidewire into the descending aorta. 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 The guide sheath was then advanced into the proximal right vertebral artery. The Select catheter was removed and the 070 guide catheter was coaxially introduced over both the XT-17 micro catheters and synchro select micro wires. The microcatheters were then advanced under roadmap guidance into the distal cervical vertebral artery. The guide catheter was then tracked over the microcatheter into the distal vertebral artery. The micro catheters were then further advanced into the proximal basilar artery. This allowed advancement of the guide catheter into the proximal basilar artery. Initially, multiple attempts were made to access the right posterior cerebral artery unsuccessfully. This included a combination  of different shape microcatheter tip. I then attempted access with the Chikai 10 microwire again with multiple different tip configurations all unsuccessfully. At this point I elected to attempt to place coils within the aneurysm without the aid of the stent as it did not appear access to the right posterior cerebral artery would be feasible. The three-dimensional  coil as well as several filling coils were placed into the aneurysm. There is a small amount of coil loop extending down near the origin of the right posterior cerebral artery. As the last coil was being placed, some coil loops appear to be extending down further to words the origin of the posterior cerebral arteries and I therefore elected to stop coiling at this point. Angiogram was taken and the micro catheters were withdrawn down into the guide catheter. Guide catheter was then withdrawn down into the cervical vertebral artery and angiogram was taken. This did reveal decreased flow through the right posterior cerebral artery suggesting some compromise of the ostium possibly due to coil mass. I therefore elected to attempt to place a stent by accessing the left vertebral artery. The guide catheter was removed, and the guide sheath was withdrawn down into the descending aorta as was the guide sheath. The select catheter was introduced over the Glidewire, and the left subclavian followed by the proximal left vertebral artery were selected. Guide sheath was then advanced into the proximal left vertebral artery. Under roadmap guidance, the 058 CT 5 guide catheter was introduced over and XT 17 microcatheter and synchro select microwire. These were then navigated until the CAT 5 guide catheter was in the proximal basilar artery. The synchro select microwire was then used to access the right posterior cerebral artery. I was able to gain access to this vessel by buttressing against the coil mass already placed in the aneurysm. Microcatheter was then tracked into the P2 segment. Microwire was removed, and the above stent was introduced. Stent was then deployed under roadmap guidance without incident. The microcatheter was then advanced over the pushing wire into the lumen of the stent. Angiogram was then taken. It did appear that flow through the right posterior cerebral artery was improved in comparison to the prior  angiogram. With the posterior cerebral artery somewhat protected, we elected to attempt to coil more of the residual aneurysm. The microwire was introduced into the microcatheter, and advanced into the lumen of the aneurysm through the stent. Microcatheter was then advanced into the residual aneurysm. The remainder of the above coils were then sequentially placed. After several coils were placed, I elected to complete the coiling procedure as I did not want to risk coil herniation through the tines of the stent and further compromise the posterior cerebral arteries. The coiling catheter was withdrawn, and the guide catheter was withdrawn down into the cervical vertebral artery. At this point, angiogram was taken which appear to reveal more decreased flow in the right posterior cerebral artery and what appear to be filling defect in the inferior wall of the right posterior cerebral artery adjacent to the coil mass. Therefore under roadmap guidance, I advanced the microcatheter over the microwire back into the lumen of the stent. I then administered a 8 mg bolus dose of Integrilin intra-arterially. After approximately 10 minutes, angiogram was taken which did reveal somewhat increased flow through the right posterior cerebral artery and better perfusion. I then administered another 8 mg bolus dose intra-arterially. After another 10 minutes, another angiogram was taken again revealing improved flow through the right  posterior cerebral artery. At this point, the pharmacy was able to deliver intravenous Integrilin which was started at a rate of 2 micrograms/kg/minute with the plan to continue for 18 hours. With improved flow in the right posterior cerebral artery, I elected to complete the procedure at this point. The guide catheter and guide sheath were then synchronously removed without incident. FINDINGS: Right internal carotid, head: Injection reveals the presence of a widely patent ICA, M1, and A1 segments and their  branches. No aneurysms, AVMs, or high-flow fistulas are seen. Of note, the posterior communicating artery is very small and no visualization of the posterior circulation is noted. The parenchymal and venous phases are normal. The venous sinuses are widely patent. Left internal carotid, head: Injection reveals the presence of a widely patent ICA, A1, and M1 segments and their branches. No aneurysms, AVMs, or high-flow fistulas are seen. Similar to the contralateral side, there is no significant visualization of the left posterior communicating artery. The parenchymal and venous phases are normal. The venous sinuses are widely patent. Left vertebral: Injection reveals the presence of a widely patent vertebral artery. This leads to a widely patent basilar artery that terminates in bilateral P1. 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 This aneurysm measures approximately 6.8 x 5.8 mm with a neck measuring approximately 4.5 mm. The parenchymal and venous phases are normal. The venous sinuses are widely patent. Right vertebral: The vertebral artery is widely patent. No PICA aneurysm is seen. See basilar description above. Right vertebral artery, head (during embolization) Angiograms taken during coiling of the basilar aneurysm through the right vertebral artery access demonstrate progressive occlusion primarily of the dome of the aneurysm. The right vertebral artery and bilateral posterior cerebral arteries appear to be widely patent. There is residual aneurysm both at the neck and centrally within the aneurysm. No filling defects are noted. Right vertebral artery, head (post coiling) Angiogram taken after coiling through the right vertebral artery reveal occlusion of the dome of the aneurysm although there is residual aneurysm centrally and at the neck. There are small coil loops which appear to be in proximity  to the origin of the right posterior cerebral artery. No filling defects are seen however flow through the right posterior cerebral artery is delayed in comparison to the left PCA, as well as in comparison to earlier angiograms suggesting flow limitation. Left vertebral artery, head (pre stent) Angiogram taken through the guide catheter in the left vertebral artery again demonstrates coil mass within the aneurysm with the residual filling as described above. Again seen is delayed filling of the right posterior cerebral artery with decreased perfusion in comparison to prior angiograms. Again, no filling defects are seen to suggest thrombus. Left vertebral artery, head (immediate post stent) Angiogram reveals widely patent left vertebral artery with stent extending from the mid basilar into the right P2 segment. No filling defects are seen. Flow within the right posterior cerebral artery appears to be improved in comparison to the pre stent angiogram with more symmetric filling of the bilateral posterior cerebral arteries. Left vertebral artery, head (during coiling) Angiogram reveals further coil mass within the residual aneurysm which is progressively occluded. No filling defects are noted. The posterior cerebral arteries remain patent. Left vertebral artery, head (immediate post coiling) Angiogram taken from the left vertebral artery reveals patent basilar artery. The left posterior cerebral artery remains widely patent. Stent is in stable position. There  is coil mass within the aneurysm, with minimal neck residual. There is decreased flow through the right posterior cerebral artery with filling defect in the inferior wall of the right P1 adjacent to the distal aneurysm neck likely representing thrombus. There is decreased perfusion of the right posterior cerebral artery territory in comparison to the prior angiograms. Left vertebral artery, head (post thrombolysis) Angiograms taken 10 minutes after initial 8 mg  bolus dose and taken again 10 minutes after the second 8 mg bolus dose sequentially reveal improvement in flow through the right posterior cerebral artery. There is also much smaller filling defect along the wall of the posterior cerebral artery adjacent to the coil mass indicating dissolution of some of the thrombus. The final angiogram does show minimally decreased flow through the right posterior cerebral artery with significantly improved perfusion of the right PCA territory in comparison to the previous angiograms. Venous sinuses remain widely patent. DISPOSITION: Upon completion of the study, the femoral sheath was secured in place with nylon suture and connected to a pressure system. The patient was extubated and transferred to the postanesthesia care unit in stable hemodynamic condition. IMPRESSION: 1. 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 Procedure was complicated by stent thrombosis treated with intra arterial and intravenous Integrilin. The preliminary results of this procedure were shared with the patient's family. Electronically Signed   By: Consuella Lose   On: 11/19/2020 20:28   IR Angiogram Follow Up Study  Result Date: 11/19/2020 PROCEDURE: DIAGNOSTIC CEREBRAL ANGIOGRAM COIL EMBOLIZATION OF BASILAR ANEURYSM HISTORY: The patient is a 60 year old woman presenting to the emergency department with several episodes of diplopia and approximally 1 week of significantly worsening headache. 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 She was therefore admitted to the hospital. After review of CTA it was felt that she would likely be a candidate for stent supported coiling. It was felt that her aneurysm was likely the cause of her new symptoms likely due to enlargement and irritation of the oculomotor nerve. Patient was loaded with  300 mg of Plavix and 325 mg of aspirin. ACCESS: The technical aspects of the procedure as well as its potential risks and benefits were reviewed with the patient and patient's family. These risks included but were not limited to stroke, intracranial hemorrhage, bleeding, infection, allergic reaction, damage to organs or vital structures, stroke, non-diagnostic procedure, and the catastrophic outcomes of heart attack, coma, and death. With an understanding of these risks, informed consent was obtained and witnessed. The patient was placed in the supine position on the angiography table and the skin of right groin prepped in the usual sterile fashion. The procedure was performed under general anesthesia. A 5-French sheath was introduced in the right common femoral artery using Seldinger technique. MEDICATIONS: HEPARIN: 8000 Units total. INTEGRILIN: 16mg  IA, 56mcg/kg/min IV continuous infusion CONTRAST:  77mL OMNIPAQUE IOHEXOL 300 MG/ML SOLN, 20mL OMNIPAQUE IOHEXOL 300 MG/ML SOLN, 54mL OMNIPAQUE IOHEXOL 300 MG/ML SOLNcc, Omnipaque 300 FLUOROSCOPY TIME:  FLUOROSCOPY TIME: See IR records TECHNIQUE: CATHETERS AND WIRES 5-French JB-1 catheter 180 cm 0.035" glidewire 6-French NeuronMax guide sheath 6-French Berenstein Select JB-1 catheter 0.070 Sofia guide catheter 0.058" CatV guidecatheter Synchro select standard microwire Synchro select soft microwire Asahi Chikai 10 microwire Excelsior XT-17 microcatheter x 2 COILS/STENTS USED Target 3D 6 mm x 15 cm Target 360 XL soft 5 mm by 10 cm Target 360 XL 4  mm x 12 cm Target 360 XL 3 mm x 9 cm not deployed Target 360 ultra 2 mm x 4 cm Target 360 ultra 2.5 mm x 4 cm x2 Target 360 ultra 2 mm x 4 cm x2 Neuroform Atlas 3.0 mm x 24 mm VESSELS CATHETERIZED Right internal carotid Left internal carotid Left vertebral Right vertebral Right common femoral Basilar artery Right posterior cerebral artery VESSELS STUDIED Right internal carotid, head Left internal carotid, head Left vertebral  Right vertebral Right vertebral artery, head (during embolization) Right vertebral artery, head (post-coiling) Left vertebral artery, head (pre stent) Left vertebral artery, head (immediate post stent) Left vertebral artery, head (during coiling) Left vertebral artery, head (immediate post coiling) Left vertebral artery, head (post thrombolysis) PROCEDURAL NARRATIVE A 5-Fr JB-1 glide catheter was advanced over a 0.035 glidewire into the aortic arch. The above vessels were then sequentially catheterized and cervical / cerebral angiograms taken. After review of images, the catheter was removed without incident. The 5-Fr sheath was then exchanged over the glidewire for an 8-Fr sheath. Under real-time fluoroscopy, the guide sheath was advanced over the select catheter and glidewire into the descending aorta. 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 The guide sheath was then advanced into the proximal right vertebral artery. The Select catheter was removed and the 070 guide catheter was coaxially introduced over both the XT-17 micro catheters and synchro select micro wires. The microcatheters were then advanced under roadmap guidance into the distal cervical vertebral artery. The guide catheter was then tracked over the microcatheter into the distal vertebral artery. The micro catheters were then further advanced into the proximal basilar artery. This allowed advancement of the guide catheter into the proximal basilar artery. Initially, multiple attempts were made to access the right posterior cerebral artery unsuccessfully. This included a combination of different shape microcatheter tip. I then attempted access with the Chikai 10 microwire again with multiple different tip configurations all unsuccessfully. At this point I elected to attempt to place coils within the aneurysm without the aid of the stent as it did not  appear access to the right posterior cerebral artery would be feasible. The three-dimensional coil as well as several filling coils were placed into the aneurysm. There is a small amount of coil loop extending down near the origin of the right posterior cerebral artery. As the last coil was being placed, some coil loops appear to be extending down further to words the origin of the posterior cerebral arteries and I therefore elected to stop coiling at this point. Angiogram was taken and the micro catheters were withdrawn down into the guide catheter. Guide catheter was then withdrawn down into the cervical vertebral artery and angiogram was taken. This did reveal decreased flow through the right posterior cerebral artery suggesting some compromise of the ostium possibly due to coil mass. I therefore elected to attempt to place a stent by accessing the left vertebral artery. The guide catheter was removed, and the guide sheath was withdrawn down into the descending aorta as was the guide sheath. The select catheter was introduced over the Glidewire, and the left subclavian followed by the proximal left vertebral artery were selected. Guide sheath was then advanced into the proximal left vertebral artery. Under roadmap guidance, the 058 CT 5 guide catheter was introduced over and XT 17 microcatheter and synchro select microwire. These were then navigated until the CAT 5 guide catheter was in the proximal  basilar artery. The synchro select microwire was then used to access the right posterior cerebral artery. I was able to gain access to this vessel by buttressing against the coil mass already placed in the aneurysm. Microcatheter was then tracked into the P2 segment. Microwire was removed, and the above stent was introduced. Stent was then deployed under roadmap guidance without incident. The microcatheter was then advanced over the pushing wire into the lumen of the stent. Angiogram was then taken. It did appear that  flow through the right posterior cerebral artery was improved in comparison to the prior angiogram. With the posterior cerebral artery somewhat protected, we elected to attempt to coil more of the residual aneurysm. The microwire was introduced into the microcatheter, and advanced into the lumen of the aneurysm through the stent. Microcatheter was then advanced into the residual aneurysm. The remainder of the above coils were then sequentially placed. After several coils were placed, I elected to complete the coiling procedure as I did not want to risk coil herniation through the tines of the stent and further compromise the posterior cerebral arteries. The coiling catheter was withdrawn, and the guide catheter was withdrawn down into the cervical vertebral artery. At this point, angiogram was taken which appear to reveal more decreased flow in the right posterior cerebral artery and what appear to be filling defect in the inferior wall of the right posterior cerebral artery adjacent to the coil mass. Therefore under roadmap guidance, I advanced the microcatheter over the microwire back into the lumen of the stent. I then administered a 8 mg bolus dose of Integrilin intra-arterially. After approximately 10 minutes, angiogram was taken which did reveal somewhat increased flow through the right posterior cerebral artery and better perfusion. I then administered another 8 mg bolus dose intra-arterially. After another 10 minutes, another angiogram was taken again revealing improved flow through the right posterior cerebral artery. At this point, the pharmacy was able to deliver intravenous Integrilin which was started at a rate of 2 micrograms/kg/minute with the plan to continue for 18 hours. With improved flow in the right posterior cerebral artery, I elected to complete the procedure at this point. The guide catheter and guide sheath were then synchronously removed without incident. FINDINGS: Right internal carotid,  head: Injection reveals the presence of a widely patent ICA, M1, and A1 segments and their branches. No aneurysms, AVMs, or high-flow fistulas are seen. Of note, the posterior communicating artery is very small and no visualization of the posterior circulation is noted. The parenchymal and venous phases are normal. The venous sinuses are widely patent. Left internal carotid, head: Injection reveals the presence of a widely patent ICA, A1, and M1 segments and their branches. No aneurysms, AVMs, or high-flow fistulas are seen. Similar to the contralateral side, there is no significant visualization of the left posterior communicating artery. The parenchymal and venous phases are normal. The venous sinuses are widely patent. Left vertebral: Injection reveals the presence of a widely patent vertebral artery. This leads to a widely patent basilar artery that terminates in bilateral P1. 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 This aneurysm measures approximately 6.8 x 5.8 mm with a neck measuring approximately 4.5 mm. The parenchymal and venous phases are normal. The venous sinuses are widely patent. Right vertebral: The vertebral artery is widely patent. No PICA aneurysm is seen. See basilar description above. Right vertebral artery, head (during embolization)  Angiograms taken during coiling of the basilar aneurysm through the right vertebral artery access demonstrate progressive occlusion primarily of the dome of the aneurysm. The right vertebral artery and bilateral posterior cerebral arteries appear to be widely patent. There is residual aneurysm both at the neck and centrally within the aneurysm. No filling defects are noted. Right vertebral artery, head (post coiling) Angiogram taken after coiling through the right vertebral artery reveal occlusion of the dome of the aneurysm although there is residual  aneurysm centrally and at the neck. There are small coil loops which appear to be in proximity to the origin of the right posterior cerebral artery. No filling defects are seen however flow through the right posterior cerebral artery is delayed in comparison to the left PCA, as well as in comparison to earlier angiograms suggesting flow limitation. Left vertebral artery, head (pre stent) Angiogram taken through the guide catheter in the left vertebral artery again demonstrates coil mass within the aneurysm with the residual filling as described above. Again seen is delayed filling of the right posterior cerebral artery with decreased perfusion in comparison to prior angiograms. Again, no filling defects are seen to suggest thrombus. Left vertebral artery, head (immediate post stent) Angiogram reveals widely patent left vertebral artery with stent extending from the mid basilar into the right P2 segment. No filling defects are seen. Flow within the right posterior cerebral artery appears to be improved in comparison to the pre stent angiogram with more symmetric filling of the bilateral posterior cerebral arteries. Left vertebral artery, head (during coiling) Angiogram reveals further coil mass within the residual aneurysm which is progressively occluded. No filling defects are noted. The posterior cerebral arteries remain patent. Left vertebral artery, head (immediate post coiling) Angiogram taken from the left vertebral artery reveals patent basilar artery. The left posterior cerebral artery remains widely patent. Stent is in stable position. There is coil mass within the aneurysm, with minimal neck residual. There is decreased flow through the right posterior cerebral artery with filling defect in the inferior wall of the right P1 adjacent to the distal aneurysm neck likely representing thrombus. There is decreased perfusion of the right posterior cerebral artery territory in comparison to the prior angiograms.  Left vertebral artery, head (post thrombolysis) Angiograms taken 10 minutes after initial 8 mg bolus dose and taken again 10 minutes after the second 8 mg bolus dose sequentially reveal improvement in flow through the right posterior cerebral artery. There is also much smaller filling defect along the wall of the posterior cerebral artery adjacent to the coil mass indicating dissolution of some of the thrombus. The final angiogram does show minimally decreased flow through the right posterior cerebral artery with significantly improved perfusion of the right PCA territory in comparison to the previous angiograms. Venous sinuses remain widely patent. DISPOSITION: Upon completion of the study, the femoral sheath was secured in place with nylon suture and connected to a pressure system. The patient was extubated and transferred to the postanesthesia care unit in stable hemodynamic condition. IMPRESSION: 1. 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 Procedure was complicated by stent thrombosis treated with intra arterial and intravenous Integrilin. The preliminary results of this procedure were shared with the patient's family. Electronically Signed   By: Consuella Lose   On: 11/19/2020 20:28   IR Angiogram Follow Up Study  Result Date: 11/19/2020 PROCEDURE: DIAGNOSTIC CEREBRAL ANGIOGRAM COIL EMBOLIZATION OF BASILAR ANEURYSM HISTORY: The patient is a 60 year old woman  presenting to the emergency department with several episodes of diplopia and approximally 1 week of significantly worsening headache. 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 She was therefore admitted to the hospital. After review of CTA it was felt that she would likely be a candidate for stent supported coiling. It was felt that her aneurysm was likely the cause of her new  symptoms likely due to enlargement and irritation of the oculomotor nerve. Patient was loaded with 300 mg of Plavix and 325 mg of aspirin. ACCESS: The technical aspects of the procedure as well as its potential risks and benefits were reviewed with the patient and patient's family. These risks included but were not limited to stroke, intracranial hemorrhage, bleeding, infection, allergic reaction, damage to organs or vital structures, stroke, non-diagnostic procedure, and the catastrophic outcomes of heart attack, coma, and death. With an understanding of these risks, informed consent was obtained and witnessed. The patient was placed in the supine position on the angiography table and the skin of right groin prepped in the usual sterile fashion. The procedure was performed under general anesthesia. A 5-French sheath was introduced in the right common femoral artery using Seldinger technique. MEDICATIONS: HEPARIN: 8000 Units total. INTEGRILIN: 16mg  IA, 45mcg/kg/min IV continuous infusion CONTRAST:  76mL OMNIPAQUE IOHEXOL 300 MG/ML SOLN, 60mL OMNIPAQUE IOHEXOL 300 MG/ML SOLN, 72mL OMNIPAQUE IOHEXOL 300 MG/ML SOLNcc, Omnipaque 300 FLUOROSCOPY TIME:  FLUOROSCOPY TIME: See IR records TECHNIQUE: CATHETERS AND WIRES 5-French JB-1 catheter 180 cm 0.035" glidewire 6-French NeuronMax guide sheath 6-French Berenstein Select JB-1 catheter 0.070 Sofia guide catheter 0.058" CatV guidecatheter Synchro select standard microwire Synchro select soft microwire Asahi Chikai 10 microwire Excelsior XT-17 microcatheter x 2 COILS/STENTS USED Target 3D 6 mm x 15 cm Target 360 XL soft 5 mm by 10 cm Target 360 XL 4 mm x 12 cm Target 360 XL 3 mm x 9 cm not deployed Target 360 ultra 2 mm x 4 cm Target 360 ultra 2.5 mm x 4 cm x2 Target 360 ultra 2 mm x 4 cm x2 Neuroform Atlas 3.0 mm x 24 mm VESSELS CATHETERIZED Right internal carotid Left internal carotid Left vertebral Right vertebral Right common femoral Basilar artery Right posterior cerebral  artery VESSELS STUDIED Right internal carotid, head Left internal carotid, head Left vertebral Right vertebral Right vertebral artery, head (during embolization) Right vertebral artery, head (post-coiling) Left vertebral artery, head (pre stent) Left vertebral artery, head (immediate post stent) Left vertebral artery, head (during coiling) Left vertebral artery, head (immediate post coiling) Left vertebral artery, head (post thrombolysis) PROCEDURAL NARRATIVE A 5-Fr JB-1 glide catheter was advanced over a 0.035 glidewire into the aortic arch. The above vessels were then sequentially catheterized and cervical / cerebral angiograms taken. After review of images, the catheter was removed without incident. The 5-Fr sheath was then exchanged over the glidewire for an 8-Fr sheath. Under real-time fluoroscopy, the guide sheath was advanced over the select catheter and glidewire into the descending aorta. 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 The guide sheath was then advanced into the proximal right vertebral artery. The Select catheter was removed and the 070 guide catheter was coaxially introduced over both the XT-17 micro catheters and synchro select micro wires. The microcatheters were then advanced under roadmap guidance into the distal cervical vertebral artery. The guide catheter was then tracked over the microcatheter into the  distal vertebral artery. The micro catheters were then further advanced into the proximal basilar artery. This allowed advancement of the guide catheter into the proximal basilar artery. Initially, multiple attempts were made to access the right posterior cerebral artery unsuccessfully. This included a combination of different shape microcatheter tip. I then attempted access with the Chikai 10 microwire again with multiple different tip configurations all unsuccessfully. At this point I  elected to attempt to place coils within the aneurysm without the aid of the stent as it did not appear access to the right posterior cerebral artery would be feasible. The three-dimensional coil as well as several filling coils were placed into the aneurysm. There is a small amount of coil loop extending down near the origin of the right posterior cerebral artery. As the last coil was being placed, some coil loops appear to be extending down further to words the origin of the posterior cerebral arteries and I therefore elected to stop coiling at this point. Angiogram was taken and the micro catheters were withdrawn down into the guide catheter. Guide catheter was then withdrawn down into the cervical vertebral artery and angiogram was taken. This did reveal decreased flow through the right posterior cerebral artery suggesting some compromise of the ostium possibly due to coil mass. I therefore elected to attempt to place a stent by accessing the left vertebral artery. The guide catheter was removed, and the guide sheath was withdrawn down into the descending aorta as was the guide sheath. The select catheter was introduced over the Glidewire, and the left subclavian followed by the proximal left vertebral artery were selected. Guide sheath was then advanced into the proximal left vertebral artery. Under roadmap guidance, the 058 CT 5 guide catheter was introduced over and XT 17 microcatheter and synchro select microwire. These were then navigated until the CAT 5 guide catheter was in the proximal basilar artery. The synchro select microwire was then used to access the right posterior cerebral artery. I was able to gain access to this vessel by buttressing against the coil mass already placed in the aneurysm. Microcatheter was then tracked into the P2 segment. Microwire was removed, and the above stent was introduced. Stent was then deployed under roadmap guidance without incident. The microcatheter was then  advanced over the pushing wire into the lumen of the stent. Angiogram was then taken. It did appear that flow through the right posterior cerebral artery was improved in comparison to the prior angiogram. With the posterior cerebral artery somewhat protected, we elected to attempt to coil more of the residual aneurysm. The microwire was introduced into the microcatheter, and advanced into the lumen of the aneurysm through the stent. Microcatheter was then advanced into the residual aneurysm. The remainder of the above coils were then sequentially placed. After several coils were placed, I elected to complete the coiling procedure as I did not want to risk coil herniation through the tines of the stent and further compromise the posterior cerebral arteries. The coiling catheter was withdrawn, and the guide catheter was withdrawn down into the cervical vertebral artery. At this point, angiogram was taken which appear to reveal more decreased flow in the right posterior cerebral artery and what appear to be filling defect in the inferior wall of the right posterior cerebral artery adjacent to the coil mass. Therefore under roadmap guidance, I advanced the microcatheter over the microwire back into the lumen of the stent. I then administered a 8 mg bolus dose of Integrilin intra-arterially. After approximately  10 minutes, angiogram was taken which did reveal somewhat increased flow through the right posterior cerebral artery and better perfusion. I then administered another 8 mg bolus dose intra-arterially. After another 10 minutes, another angiogram was taken again revealing improved flow through the right posterior cerebral artery. At this point, the pharmacy was able to deliver intravenous Integrilin which was started at a rate of 2 micrograms/kg/minute with the plan to continue for 18 hours. With improved flow in the right posterior cerebral artery, I elected to complete the procedure at this point. The guide  catheter and guide sheath were then synchronously removed without incident. FINDINGS: Right internal carotid, head: Injection reveals the presence of a widely patent ICA, M1, and A1 segments and their branches. No aneurysms, AVMs, or high-flow fistulas are seen. Of note, the posterior communicating artery is very small and no visualization of the posterior circulation is noted. The parenchymal and venous phases are normal. The venous sinuses are widely patent. Left internal carotid, head: Injection reveals the presence of a widely patent ICA, A1, and M1 segments and their branches. No aneurysms, AVMs, or high-flow fistulas are seen. Similar to the contralateral side, there is no significant visualization of the left posterior communicating artery. The parenchymal and venous phases are normal. The venous sinuses are widely patent. Left vertebral: Injection reveals the presence of a widely patent vertebral artery. This leads to a widely patent basilar artery that terminates in bilateral P1. 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 This aneurysm measures approximately 6.8 x 5.8 mm with a neck measuring approximately 4.5 mm. The parenchymal and venous phases are normal. The venous sinuses are widely patent. Right vertebral: The vertebral artery is widely patent. No PICA aneurysm is seen. See basilar description above. Right vertebral artery, head (during embolization) Angiograms taken during coiling of the basilar aneurysm through the right vertebral artery access demonstrate progressive occlusion primarily of the dome of the aneurysm. The right vertebral artery and bilateral posterior cerebral arteries appear to be widely patent. There is residual aneurysm both at the neck and centrally within the aneurysm. No filling defects are noted. Right vertebral artery, head (post coiling) Angiogram taken after coiling  through the right vertebral artery reveal occlusion of the dome of the aneurysm although there is residual aneurysm centrally and at the neck. There are small coil loops which appear to be in proximity to the origin of the right posterior cerebral artery. No filling defects are seen however flow through the right posterior cerebral artery is delayed in comparison to the left PCA, as well as in comparison to earlier angiograms suggesting flow limitation. Left vertebral artery, head (pre stent) Angiogram taken through the guide catheter in the left vertebral artery again demonstrates coil mass within the aneurysm with the residual filling as described above. Again seen is delayed filling of the right posterior cerebral artery with decreased perfusion in comparison to prior angiograms. Again, no filling defects are seen to suggest thrombus. Left vertebral artery, head (immediate post stent) Angiogram reveals widely patent left vertebral artery with stent extending from the mid basilar into the right P2 segment. No filling defects are seen. Flow within the right posterior cerebral artery appears to be improved in comparison to the pre stent angiogram with more symmetric filling of the bilateral posterior cerebral arteries. Left vertebral artery, head (during coiling) Angiogram reveals further coil mass within the residual aneurysm which is progressively  occluded. No filling defects are noted. The posterior cerebral arteries remain patent. Left vertebral artery, head (immediate post coiling) Angiogram taken from the left vertebral artery reveals patent basilar artery. The left posterior cerebral artery remains widely patent. Stent is in stable position. There is coil mass within the aneurysm, with minimal neck residual. There is decreased flow through the right posterior cerebral artery with filling defect in the inferior wall of the right P1 adjacent to the distal aneurysm neck likely representing thrombus. There is  decreased perfusion of the right posterior cerebral artery territory in comparison to the prior angiograms. Left vertebral artery, head (post thrombolysis) Angiograms taken 10 minutes after initial 8 mg bolus dose and taken again 10 minutes after the second 8 mg bolus dose sequentially reveal improvement in flow through the right posterior cerebral artery. There is also much smaller filling defect along the wall of the posterior cerebral artery adjacent to the coil mass indicating dissolution of some of the thrombus. The final angiogram does show minimally decreased flow through the right posterior cerebral artery with significantly improved perfusion of the right PCA territory in comparison to the previous angiograms. Venous sinuses remain widely patent. DISPOSITION: Upon completion of the study, the femoral sheath was secured in place with nylon suture and connected to a pressure system. The patient was extubated and transferred to the postanesthesia care unit in stable hemodynamic condition. IMPRESSION: 1. 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 Procedure was complicated by stent thrombosis treated with intra arterial and intravenous Integrilin. The preliminary results of this procedure were shared with the patient's family. Electronically Signed   By: Consuella Lose   On: 11/19/2020 20:28   IR Intra Cran Stent  Result Date: 11/19/2020 PROCEDURE: DIAGNOSTIC CEREBRAL ANGIOGRAM COIL EMBOLIZATION OF BASILAR ANEURYSM HISTORY: The patient is a 60 year old woman presenting to the emergency department with several episodes of diplopia and approximally 1 week of significantly worsening headache. 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 She was therefore admitted to the hospital. After review of CTA it was felt that she would likely be a  candidate for stent supported coiling. It was felt that her aneurysm was likely the cause of her new symptoms likely due to enlargement and irritation of the oculomotor nerve. Patient was loaded with 300 mg of Plavix and 325 mg of aspirin. ACCESS: The technical aspects of the procedure as well as its potential risks and benefits were reviewed with the patient and patient's family. These risks included but were not limited to stroke, intracranial hemorrhage, bleeding, infection, allergic reaction, damage to organs or vital structures, stroke, non-diagnostic procedure, and the catastrophic outcomes of heart attack, coma, and death. With an understanding of these risks, informed consent was obtained and witnessed. The patient was placed in the supine position on the angiography table and the skin of right groin prepped in the usual sterile fashion. The procedure was performed under general anesthesia. A 5-French sheath was introduced in the right common femoral artery using Seldinger technique. MEDICATIONS: HEPARIN: 8000 Units total. INTEGRILIN: 16mg  IA, 75mcg/kg/min IV continuous infusion CONTRAST:  104mL OMNIPAQUE IOHEXOL 300 MG/ML SOLN, 6mL OMNIPAQUE IOHEXOL 300 MG/ML SOLN, 91mL OMNIPAQUE IOHEXOL 300 MG/ML SOLNcc, Omnipaque 300 FLUOROSCOPY TIME:  FLUOROSCOPY TIME: See IR records TECHNIQUE: CATHETERS AND WIRES 5-French JB-1 catheter 180 cm 0.035" glidewire 6-French NeuronMax guide sheath 6-French Berenstein Select JB-1 catheter 0.070 Sofia guide  catheter 0.058" CatV guidecatheter Synchro select standard microwire Synchro select soft microwire [employer] Chikai 10 microwire Excelsior XT-17 microcatheter x 2 COILS/STENTS USED Target 3D 6 mm x 15 cm Target 360 XL soft 5 mm by 10 cm Target 360 XL 4 mm x 12 cm Target 360 XL 3 mm x 9 cm not deployed Target 360 ultra 2 mm x 4 cm Target 360 ultra 2.5 mm x 4 cm x2 Target 360 ultra 2 mm x 4 cm x2 Neuroform Atlas 3.0 mm x 24 mm VESSELS CATHETERIZED Right internal carotid Left  internal carotid Left vertebral Right vertebral Right common femoral Basilar artery Right posterior cerebral artery VESSELS STUDIED Right internal carotid, head Left internal carotid, head Left vertebral Right vertebral Right vertebral artery, head (during embolization) Right vertebral artery, head (post-coiling) Left vertebral artery, head (pre stent) Left vertebral artery, head (immediate post stent) Left vertebral artery, head (during coiling) Left vertebral artery, head (immediate post coiling) Left vertebral artery, head (post thrombolysis) PROCEDURAL NARRATIVE A 5-Fr JB-1 glide catheter was advanced over a 0.035 glidewire into the aortic arch. The above vessels were then sequentially catheterized and cervical / cerebral angiograms taken. After review of images, the catheter was removed without incident. The 5-Fr sheath was then exchanged over the glidewire for an 8-Fr sheath. Under real-time fluoroscopy, the guide sheath was advanced over the select catheter and glidewire into the descending aorta. 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 The guide sheath was then advanced into the proximal right vertebral artery. The Select catheter was removed and the 070 guide catheter was coaxially introduced over both the XT-17 micro catheters and synchro select micro wires. The microcatheters were then advanced under roadmap guidance into the distal cervical vertebral artery. The guide catheter was then tracked over the microcatheter into the distal vertebral artery. The micro catheters were then further advanced into the proximal basilar artery. This allowed advancement of the guide catheter into the proximal basilar artery. Initially, multiple attempts were made to access the right posterior cerebral artery unsuccessfully. This included a combination of different shape microcatheter tip. I then attempted access with the  Chikai 10 microwire again with multiple different tip configurations all unsuccessfully. At this point I elected to attempt to place coils within the aneurysm without the aid of the stent as it did not appear access to the right posterior cerebral artery would be feasible. The three-dimensional coil as well as several filling coils were placed into the aneurysm. There is a small amount of coil loop extending down near the origin of the right posterior cerebral artery. As the last coil was being placed, some coil loops appear to be extending down further to words the origin of the posterior cerebral arteries and I therefore elected to stop coiling at this point. Angiogram was taken and the micro catheters were withdrawn down into the guide catheter. Guide catheter was then withdrawn down into the cervical vertebral artery and angiogram was taken. This did reveal decreased flow through the right posterior cerebral artery suggesting some compromise of the ostium possibly due to coil mass. I therefore elected to attempt to place a stent by accessing the left vertebral artery. The guide catheter was removed, and the guide sheath was withdrawn down into the descending aorta as was the guide sheath. The select catheter was introduced over the Glidewire, and the left subclavian followed by the proximal left vertebral artery were selected. Guide sheath  was then advanced into the proximal left vertebral artery. Under roadmap guidance, the 058 CT 5 guide catheter was introduced over and XT 17 microcatheter and synchro select microwire. These were then navigated until the CAT 5 guide catheter was in the proximal basilar artery. The synchro select microwire was then used to access the right posterior cerebral artery. I was able to gain access to this vessel by buttressing against the coil mass already placed in the aneurysm. Microcatheter was then tracked into the P2 segment. Microwire was removed, and the above stent was  introduced. Stent was then deployed under roadmap guidance without incident. The microcatheter was then advanced over the pushing wire into the lumen of the stent. Angiogram was then taken. It did appear that flow through the right posterior cerebral artery was improved in comparison to the prior angiogram. With the posterior cerebral artery somewhat protected, we elected to attempt to coil more of the residual aneurysm. The microwire was introduced into the microcatheter, and advanced into the lumen of the aneurysm through the stent. Microcatheter was then advanced into the residual aneurysm. The remainder of the above coils were then sequentially placed. After several coils were placed, I elected to complete the coiling procedure as I did not want to risk coil herniation through the tines of the stent and further compromise the posterior cerebral arteries. The coiling catheter was withdrawn, and the guide catheter was withdrawn down into the cervical vertebral artery. At this point, angiogram was taken which appear to reveal more decreased flow in the right posterior cerebral artery and what appear to be filling defect in the inferior wall of the right posterior cerebral artery adjacent to the coil mass. Therefore under roadmap guidance, I advanced the microcatheter over the microwire back into the lumen of the stent. I then administered a 8 mg bolus dose of Integrilin intra-arterially. After approximately 10 minutes, angiogram was taken which did reveal somewhat increased flow through the right posterior cerebral artery and better perfusion. I then administered another 8 mg bolus dose intra-arterially. After another 10 minutes, another angiogram was taken again revealing improved flow through the right posterior cerebral artery. At this point, the pharmacy was able to deliver intravenous Integrilin which was started at a rate of 2 micrograms/kg/minute with the plan to continue for 18 hours. With improved flow in  the right posterior cerebral artery, I elected to complete the procedure at this point. The guide catheter and guide sheath were then synchronously removed without incident. FINDINGS: Right internal carotid, head: Injection reveals the presence of a widely patent ICA, M1, and A1 segments and their branches. No aneurysms, AVMs, or high-flow fistulas are seen. Of note, the posterior communicating artery is very small and no visualization of the posterior circulation is noted. The parenchymal and venous phases are normal. The venous sinuses are widely patent. Left internal carotid, head: Injection reveals the presence of a widely patent ICA, A1, and M1 segments and their branches. No aneurysms, AVMs, or high-flow fistulas are seen. Similar to the contralateral side, there is no significant visualization of the left posterior communicating artery. The parenchymal and venous phases are normal. The venous sinuses are widely patent. Left vertebral: Injection reveals the presence of a widely patent vertebral artery. This leads to a widely patent basilar artery that terminates in bilateral P1. 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 This aneurysm measures approximately 6.8 x 5.8 mm  with a neck measuring approximately 4.5 mm. The parenchymal and venous phases are normal. The venous sinuses are widely patent. Right vertebral: The vertebral artery is widely patent. No PICA aneurysm is seen. See basilar description above. Right vertebral artery, head (during embolization) Angiograms taken during coiling of the basilar aneurysm through the right vertebral artery access demonstrate progressive occlusion primarily of the dome of the aneurysm. The right vertebral artery and bilateral posterior cerebral arteries appear to be widely patent. There is residual aneurysm both at the neck and centrally within the aneurysm. No  filling defects are noted. Right vertebral artery, head (post coiling) Angiogram taken after coiling through the right vertebral artery reveal occlusion of the dome of the aneurysm although there is residual aneurysm centrally and at the neck. There are small coil loops which appear to be in proximity to the origin of the right posterior cerebral artery. No filling defects are seen however flow through the right posterior cerebral artery is delayed in comparison to the left PCA, as well as in comparison to earlier angiograms suggesting flow limitation. Left vertebral artery, head (pre stent) Angiogram taken through the guide catheter in the left vertebral artery again demonstrates coil mass within the aneurysm with the residual filling as described above. Again seen is delayed filling of the right posterior cerebral artery with decreased perfusion in comparison to prior angiograms. Again, no filling defects are seen to suggest thrombus. Left vertebral artery, head (immediate post stent) Angiogram reveals widely patent left vertebral artery with stent extending from the mid basilar into the right P2 segment. No filling defects are seen. Flow within the right posterior cerebral artery appears to be improved in comparison to the pre stent angiogram with more symmetric filling of the bilateral posterior cerebral arteries. Left vertebral artery, head (during coiling) Angiogram reveals further coil mass within the residual aneurysm which is progressively occluded. No filling defects are noted. The posterior cerebral arteries remain patent. Left vertebral artery, head (immediate post coiling) Angiogram taken from the left vertebral artery reveals patent basilar artery. The left posterior cerebral artery remains widely patent. Stent is in stable position. There is coil mass within the aneurysm, with minimal neck residual. There is decreased flow through the right posterior cerebral artery with filling defect in the inferior  wall of the right P1 adjacent to the distal aneurysm neck likely representing thrombus. There is decreased perfusion of the right posterior cerebral artery territory in comparison to the prior angiograms. Left vertebral artery, head (post thrombolysis) Angiograms taken 10 minutes after initial 8 mg bolus dose and taken again 10 minutes after the second 8 mg bolus dose sequentially reveal improvement in flow through the right posterior cerebral artery. There is also much smaller filling defect along the wall of the posterior cerebral artery adjacent to the coil mass indicating dissolution of some of the thrombus. The final angiogram does show minimally decreased flow through the right posterior cerebral artery with significantly improved perfusion of the right PCA territory in comparison to the previous angiograms. Venous sinuses remain widely patent. DISPOSITION: Upon completion of the study, the femoral sheath was secured in place with nylon suture and connected to a pressure system. The patient was extubated and transferred to the postanesthesia care unit in stable hemodynamic condition. IMPRESSION: 1. 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 Procedure was complicated by stent thrombosis treated with intra arterial and intravenous Integrilin. The preliminary results of this procedure were shared with  the patient's family. Electronically Signed   By: Consuella Lose   On: 11/19/2020 20:28   VAS [place] TRANSCRANIAL DOPPLER W BUBBLES  Result Date: 12/17/2020  Transcranial Doppler with Bubble Patient Name:  Tiffany Horn  Date of Exam:   12/17/2020 Medical Rec #: [id number]       Accession #:    [id number] Date of Birth: 1960/12/30       Patient Gender: F Patient Age:   60 years Exam Location:  [facility] Procedure:      VAS [place] TRANSCRANIAL DOPPLER W BUBBLES Referring Phys: Cornelius Moras XU  --------------------------------------------------------------------------------  Indications: Stroke. Comparison Study: no prior Performing Technologist: Archie Patten RVS  Examination Guidelines: A complete evaluation includes B-mode imaging, spectral Doppler, color Doppler, and power Doppler as needed of all accessible portions of each vessel. Bilateral testing is considered an integral part of a complete examination. Limited examinations for reoccurring indications may be performed as noted.  Summary: No HITS at rest or during Valsalva. Negative transcranial Doppler Bubble study with no evidence of right to left intracardiac communication.  A vascular evaluation was performed. The right middle cerebral artery was studied. An IV was inserted into the patient's right forearm . Verbal informed consent was obtained.  *See table(s) above for TCD measurements and observations.    Preliminary    ECHOCARDIOGRAM COMPLETE  Result Date: 12/05/2020    ECHOCARDIOGRAM REPORT   Patient Name:   Tiffany Horn Date of Exam: 12/05/2020 Medical Rec #:  [id number]      Height:       66.0 in Accession #:    [id number]     Weight:       186.7 lb Date of Birth:  1960-11-22      BSA:          1.942 m Patient Age:    3 years       BP:           110/67 mmHg Patient Gender: F              HR:           73 bpm. Exam Location:  Inpatient Procedure: 2D Echo Indications:    stroke  History:        Patient has no prior history of Echocardiogram examinations.  Sonographer:    [place] Referring Phys: [id number] [place]  1. Left ventricular ejection fraction, by estimation, is 60 to 65%. The left ventricle has normal function. The left ventricle has no regional wall motion abnormalities. Left ventricular diastolic parameters were normal.  2. Right ventricular systolic function is normal. The right ventricular size is normal.  3. The mitral valve is normal in structure. No evidence of mitral valve regurgitation.  No evidence of mitral stenosis.  4. The aortic valve is normal in structure. Aortic valve regurgitation is not visualized. No aortic stenosis is present.  5. The inferior vena cava is normal in size with greater than 50% respiratory variability, suggesting right atrial pressure of 3 mmHg. FINDINGS  Left Ventricle: Left ventricular ejection fraction, by estimation, is 60 to 65%. The left ventricle has normal function. The left ventricle has no regional wall motion abnormalities. The left ventricular internal cavity size was normal in size. There is  no left ventricular hypertrophy. Left ventricular diastolic parameters were normal. Right Ventricle: The right ventricular size is normal. No increase in right ventricular wall thickness. Right ventricular systolic function is normal. Left Atrium: Left atrial size  was normal in size. Right Atrium: Right atrial size was normal in size. Pericardium: There is no evidence of pericardial effusion. Mitral Valve: The mitral valve is normal in structure. No evidence of mitral valve regurgitation. No evidence of mitral valve stenosis. Tricuspid Valve: The tricuspid valve is normal in structure. Tricuspid valve regurgitation is trivial. No evidence of tricuspid stenosis. Aortic Valve: The aortic valve is normal in structure. Aortic valve regurgitation is not visualized. No aortic stenosis is present. Pulmonic Valve: The pulmonic valve was normal in structure. Pulmonic valve regurgitation is not visualized. No evidence of pulmonic stenosis. Aorta: The aortic root is normal in size and structure. Venous: The inferior vena cava is normal in size with greater than 50% respiratory variability, suggesting right atrial pressure of 3 mmHg. IAS/Shunts: No atrial level shunt detected by color flow Doppler.  LEFT VENTRICLE PLAX 2D LVIDd:         3.60 cm   Diastology LVIDs:         2.30 cm   LV e' medial:    8.49 cm/s LV PW:         1.00 cm   LV E/e' medial:  7.5 LV IVS:        1.10 cm   LV e'  lateral:   9.36 cm/s LVOT diam:     1.90 cm   LV E/e' lateral: 6.8 LV SV:         48 LV SV Index:   25 LVOT Area:     2.84 cm  RIGHT VENTRICLE             IVC RV S prime:     16.90 cm/s  IVC diam: 0.90 cm TAPSE (M-mode): 2.1 cm LEFT ATRIUM             Index        RIGHT ATRIUM           Index LA diam:        3.20 cm 1.65 cm/m   RA Area:     10.70 cm LA Vol (A2C):   27.0 ml 13.90 ml/m  RA Volume:   21.70 ml  11.17 ml/m LA Vol (A4C):   36.1 ml 18.59 ml/m LA Biplane Vol: 32.4 ml 16.68 ml/m  AORTIC VALVE LVOT Vmax:   89.30 cm/s LVOT Vmean:  58.900 cm/s LVOT VTI:    0.171 m  AORTA Ao Root diam: 3.20 cm Ao Asc diam:  3.30 cm MITRAL VALVE MV Area (PHT): 3.31 cm    SHUNTS MV Decel Time: 229 msec    Systemic VTI:  0.17 m MV E velocity: 63.60 cm/s  Systemic Diam: 1.90 cm MV A velocity: 65.60 cm/s MV E/A ratio:  0.97 Jenkins Rouge MD Electronically signed by Jenkins Rouge MD Signature Date/Time: 12/05/2020/11:04:17 AM    Final    CT RENAL STONE STUDY  Result Date: 12/17/2020 CLINICAL DATA:  Lower abdominal pain bilateral.  Hematuria EXAM: CT ABDOMEN AND PELVIS WITHOUT CONTRAST TECHNIQUE: Multidetector CT imaging of the abdomen and pelvis was performed following the standard protocol without IV contrast. COMPARISON:  CT October 19, 2019. FINDINGS: Lower chest: No acute abnormality. Hepatobiliary: Unremarkable noncontrast appearance of hepatic. Gallbladder is unremarkable. No biliary ductal dilation. Pancreas: No pancreatic ductal dilation or evidence of acute inflammation. Spleen: Within limits. Adrenals/Urinary Tract: Bilateral adrenal glands are unremarkable. No hydronephrosis. No renal, ureteral or bladder calculi visualized. Urinary bladder is unremarkable for degree of distension. Stomach/Bowel: No enteric contrast was administered. Small hiatal hernia  otherwise stomach is unremarkable for degree of distension. No pathologic dilation of small or large bowel. The appendix is confidently identified is no  pericecal inflammation. Terminal ileum unremarkable. Very mild wall thickening of the colon extending from the cecum through the splenic flexure. Vascular/Lymphatic: Scattered aortic atherosclerosis without abdominal aortic aneurysm. No pathologically enlarged abdominal or pelvic lymph nodes. Reproductive: Uterus and bilateral adnexa are unremarkable. Other: No new with peritoneum. No walled off fluid collections. No significant abdominopelvic free fluid. Musculoskeletal: Again seen is a moderate compression fracture of the T12 vertebral body. Multilevel degenerative changes spine. No acute osseous abnormality. IMPRESSION: 1. Very mild wall thickening of the colon extending from the cecum through the splenic flexure, which may reflect under distension or represent a mild infectious or inflammatory colitis. 2. No evidence of nephrolithiasis or obstructive uropathy. 3.  Aortic Atherosclerosis (ICD10-I70.0). Electronically Signed   By: Dahlia Bailiff M.D.   On: 12/17/2020 10:41   CT HEAD CODE STROKE WO CONTRAST  Result Date: 12/16/2020 CLINICAL DATA:  Code stroke.  Acute neurologic deficit EXAM: CT HEAD WITHOUT CONTRAST TECHNIQUE: Contiguous axial images were obtained from the base of the skull through the vertex without intravenous contrast. COMPARISON:  None. FINDINGS: Brain: There is no mass, hemorrhage or extra-axial collection. The size and configuration of the ventricles and extra-axial CSF spaces are normal. The brain parenchyma is normal, without evidence of acute or chronic infarction. Vascular: Coil mass at the tip of the basilar artery. Skull: The visualized skull base, calvarium and extracranial soft tissues are normal. Sinuses/Orbits: No fluid levels or advanced mucosal thickening of the visualized paranasal sinuses. No mastoid or middle ear effusion. The orbits are normal. ASPECTS [facility] Stroke Program Early CT Score) - Ganglionic level infarction (caudate, lentiform nuclei, internal capsule,  insula, M1-M3 cortex): 7 - Supraganglionic infarction (M4-M6 cortex): 3 Total score (0-10 with 10 being normal): 10 IMPRESSION: 1. No acute intracranial abnormality. 2. ASPECTS is 10. These results were communicated to Dr. Kerney Elbe at 11:12 pm on 12/16/2020 by text page via the [facility] messaging system. Electronically Signed   By: Ulyses Jarred M.D.   On: 12/16/2020 23:16   CT HEAD CODE STROKE WO CONTRAST  Result Date: 12/04/2020 CLINICAL DATA:  Code stroke. Neuro deficit, acute, stroke suspected. Left-sided weakness with tingling in the arm and leg. Visual deficit with blurry vision following treatment of an aneurysm last month. EXAM: CT HEAD WITHOUT CONTRAST TECHNIQUE: Contiguous axial images were obtained from the base of the skull through the vertex without intravenous contrast. COMPARISON:  Head MRI 12/02/2020 FINDINGS: Brain: There is no evidence of an acute large territory infarct, intracranial hemorrhage, mass, midline shift, or extra-axial fluid collection. There is slight hypoattenuation in the right occipital lobe in the region of the small acute to subacute infarcts on the recent prior MRI. The ventricles and sulci are normal. 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 No gross hyperdense vessel. Skull: No fracture or suspicious osseous lesion. Sinuses/Orbits: Visualized paranasal sinuses and mastoid air cells are clear. Unremarkable orbits. Other: None. ASPECTS [facility] Stroke Program Early CT Score) - Ganglionic level infarction (caudate, lentiform nuclei, internal capsule, insula, M1-M3 cortex): 7 - Supraganglionic infarction (M4-M6 cortex): 3 Total score (0-10 with 10 being normal): 10 IMPRESSION: 1. No intracranial hemorrhage or definite new infarct. ASPECTS of 10. 2. Small subacute right occipital infarcts. These results were communicated to Dr. Rory Percy at 2:55 pm on 12/04/2020 by text page via the [facility] messaging system. Electronically Signed   By: Zenia Resides  Jeralyn Ruths M.D.   On:  12/04/2020 14:55   VAS [place] LOWER EXTREMITY VENOUS (DVT)  Result Date: 12/17/2020  Lower Venous DVT Study Patient Name:  Tiffany Horn  Date of Exam:   12/17/2020 Medical Rec #: [id number]       Accession #:    [id number] Date of Birth: 1960/08/06       Patient Gender: F Patient Age:   66 years Exam Location:  [facility] Procedure:      VAS [place] LOWER EXTREMITY VENOUS (DVT) Referring Phys: Cornelius Moras XU --------------------------------------------------------------------------------  Indications: Stroke.  Comparison Study: no prior Performing Technologist: Archie Patten RVS  Examination Guidelines: A complete evaluation includes B-mode imaging, spectral Doppler, color Doppler, and power Doppler as needed of all accessible portions of each vessel. Bilateral testing is considered an integral part of a complete examination. Limited examinations for reoccurring indications may be performed as noted. The reflux portion of the exam is performed with the patient in reverse Trendelenburg.  +---------+---------------+---------+-----------+----------+--------------+ RIGHT    CompressibilityPhasicitySpontaneityPropertiesThrombus Aging +---------+---------------+---------+-----------+----------+--------------+ CFV      Full           Yes      Yes                                 +---------+---------------+---------+-----------+----------+--------------+ SFJ      Full                                                        +---------+---------------+---------+-----------+----------+--------------+ FV Prox  Full                                                        +---------+---------------+---------+-----------+----------+--------------+ FV Mid   Full                                                        +---------+---------------+---------+-----------+----------+--------------+ FV DistalFull                                                         +---------+---------------+---------+-----------+----------+--------------+ PFV      Full                                                        +---------+---------------+---------+-----------+----------+--------------+ POP      Full           Yes      Yes                                 +---------+---------------+---------+-----------+----------+--------------+ PTV  Full                                                        +---------+---------------+---------+-----------+----------+--------------+ PERO     Full                                                        +---------+---------------+---------+-----------+----------+--------------+   +---------+---------------+---------+-----------+----------+--------------+ LEFT     CompressibilityPhasicitySpontaneityPropertiesThrombus Aging +---------+---------------+---------+-----------+----------+--------------+ CFV      Full           Yes      Yes                                 +---------+---------------+---------+-----------+----------+--------------+ SFJ      Full                                                        +---------+---------------+---------+-----------+----------+--------------+ FV Prox  Full                                                        +---------+---------------+---------+-----------+----------+--------------+ FV Mid   Full                                                        +---------+---------------+---------+-----------+----------+--------------+ FV DistalFull                                                        +---------+---------------+---------+-----------+----------+--------------+ PFV      Full                                                        +---------+---------------+---------+-----------+----------+--------------+ POP      Full           Yes      Yes                                  +---------+---------------+---------+-----------+----------+--------------+ PTV      Full                                                        +---------+---------------+---------+-----------+----------+--------------+  PERO     Full                                                        +---------+---------------+---------+-----------+----------+--------------+     Summary: BILATERAL: - No evidence of deep vein thrombosis seen in the lower extremities, bilaterally. -No evidence of popliteal cyst, bilaterally.   *See table(s) above for measurements and observations.    Preliminary    IR ANGIO INTRA EXTRACRAN SEL INTERNAL CAROTID BILAT MOD SED  Result Date: 11/19/2020 PROCEDURE: DIAGNOSTIC CEREBRAL ANGIOGRAM COIL EMBOLIZATION OF BASILAR ANEURYSM HISTORY: The patient is a 60 year old woman presenting to the emergency department with several episodes of diplopia and approximally 1 week of significantly worsening headache. 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 She was therefore admitted to the hospital. After review of CTA it was felt that she would likely be a candidate for stent supported coiling. It was felt that her aneurysm was likely the cause of her new symptoms likely due to enlargement and irritation of the oculomotor nerve. Patient was loaded with 300 mg of Plavix and 325 mg of aspirin. ACCESS: The technical aspects of the procedure as well as its potential risks and benefits were reviewed with the patient and patient's family. These risks included but were not limited to stroke, intracranial hemorrhage, bleeding, infection, allergic reaction, damage to organs or vital structures, stroke, non-diagnostic procedure, and the catastrophic outcomes of heart attack, coma, and death. With an understanding of these risks, informed consent was obtained and witnessed. The patient was placed in the  supine position on the angiography table and the skin of right groin prepped in the usual sterile fashion. The procedure was performed under general anesthesia. A 5-French sheath was introduced in the right common femoral artery using Seldinger technique. MEDICATIONS: HEPARIN: 8000 Units total. INTEGRILIN: 16mg  IA, 72mcg/kg/min IV continuous infusion CONTRAST:  43mL OMNIPAQUE IOHEXOL 300 MG/ML SOLN, 46mL OMNIPAQUE IOHEXOL 300 MG/ML SOLN, 31mL OMNIPAQUE IOHEXOL 300 MG/ML SOLNcc, Omnipaque 300 FLUOROSCOPY TIME:  FLUOROSCOPY TIME: See IR records TECHNIQUE: CATHETERS AND WIRES 5-French JB-1 catheter 180 cm 0.035" glidewire 6-French NeuronMax guide sheath 6-French Berenstein Select JB-1 catheter 0.070 Sofia guide catheter 0.058" CatV guidecatheter Synchro select standard microwire Synchro select soft microwire Asahi Chikai 10 microwire Excelsior XT-17 microcatheter x 2 COILS/STENTS USED Target 3D 6 mm x 15 cm Target 360 XL soft 5 mm by 10 cm Target 360 XL 4 mm x 12 cm Target 360 XL 3 mm x 9 cm not deployed Target 360 ultra 2 mm x 4 cm Target 360 ultra 2.5 mm x 4 cm x2 Target 360 ultra 2 mm x 4 cm x2 Neuroform Atlas 3.0 mm x 24 mm VESSELS CATHETERIZED Right internal carotid Left internal carotid Left vertebral Right vertebral Right common femoral Basilar artery Right posterior cerebral artery VESSELS STUDIED Right internal carotid, head Left internal carotid, head Left vertebral Right vertebral Right vertebral artery, head (during embolization) Right vertebral artery, head (post-coiling) Left vertebral artery, head (pre stent) Left vertebral artery, head (immediate post stent) Left vertebral artery, head (during coiling) Left vertebral artery, head (immediate post coiling) Left vertebral artery, head (post thrombolysis) PROCEDURAL NARRATIVE A 5-Fr JB-1 glide catheter was advanced over a 0.035 glidewire into the aortic arch. The above vessels  were then sequentially catheterized and cervical / cerebral angiograms taken.  After review of images, the catheter was removed without incident. The 5-Fr sheath was then exchanged over the glidewire for an 8-Fr sheath. Under real-time fluoroscopy, the guide sheath was advanced over the select catheter and glidewire into the descending aorta. 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 The guide sheath was then advanced into the proximal right vertebral artery. The Select catheter was removed and the 070 guide catheter was coaxially introduced over both the XT-17 micro catheters and synchro select micro wires. The microcatheters were then advanced under roadmap guidance into the distal cervical vertebral artery. The guide catheter was then tracked over the microcatheter into the distal vertebral artery. The micro catheters were then further advanced into the proximal basilar artery. This allowed advancement of the guide catheter into the proximal basilar artery. Initially, multiple attempts were made to access the right posterior cerebral artery unsuccessfully. This included a combination of different shape microcatheter tip. I then attempted access with the Chikai 10 microwire again with multiple different tip configurations all unsuccessfully. At this point I elected to attempt to place coils within the aneurysm without the aid of the stent as it did not appear access to the right posterior cerebral artery would be feasible. The three-dimensional coil as well as several filling coils were placed into the aneurysm. There is a small amount of coil loop extending down near the origin of the right posterior cerebral artery. As the last coil was being placed, some coil loops appear to be extending down further to words the origin of the posterior cerebral arteries and I therefore elected to stop coiling at this point. Angiogram was taken and the micro catheters were withdrawn down into the guide  catheter. Guide catheter was then withdrawn down into the cervical vertebral artery and angiogram was taken. This did reveal decreased flow through the right posterior cerebral artery suggesting some compromise of the ostium possibly due to coil mass. I therefore elected to attempt to place a stent by accessing the left vertebral artery. The guide catheter was removed, and the guide sheath was withdrawn down into the descending aorta as was the guide sheath. The select catheter was introduced over the Glidewire, and the left subclavian followed by the proximal left vertebral artery were selected. Guide sheath was then advanced into the proximal left vertebral artery. Under roadmap guidance, the 058 CT 5 guide catheter was introduced over and XT 17 microcatheter and synchro select microwire. These were then navigated until the CAT 5 guide catheter was in the proximal basilar artery. The synchro select microwire was then used to access the right posterior cerebral artery. I was able to gain access to this vessel by buttressing against the coil mass already placed in the aneurysm. Microcatheter was then tracked into the P2 segment. Microwire was removed, and the above stent was introduced. Stent was then deployed under roadmap guidance without incident. The microcatheter was then advanced over the pushing wire into the lumen of the stent. Angiogram was then taken. It did appear that flow through the right posterior cerebral artery was improved in comparison to the prior angiogram. With the posterior cerebral artery somewhat protected, we elected to attempt to coil more of the residual aneurysm. The microwire was introduced into the microcatheter, and advanced into the lumen of the aneurysm through the stent. Microcatheter was then advanced into the residual aneurysm. The  remainder of the above coils were then sequentially placed. After several coils were placed, I elected to complete the coiling procedure as I did not  want to risk coil herniation through the tines of the stent and further compromise the posterior cerebral arteries. The coiling catheter was withdrawn, and the guide catheter was withdrawn down into the cervical vertebral artery. At this point, angiogram was taken which appear to reveal more decreased flow in the right posterior cerebral artery and what appear to be filling defect in the inferior wall of the right posterior cerebral artery adjacent to the coil mass. Therefore under roadmap guidance, I advanced the microcatheter over the microwire back into the lumen of the stent. I then administered a 8 mg bolus dose of Integrilin intra-arterially. After approximately 10 minutes, angiogram was taken which did reveal somewhat increased flow through the right posterior cerebral artery and better perfusion. I then administered another 8 mg bolus dose intra-arterially. After another 10 minutes, another angiogram was taken again revealing improved flow through the right posterior cerebral artery. At this point, the pharmacy was able to deliver intravenous Integrilin which was started at a rate of 2 micrograms/kg/minute with the plan to continue for 18 hours. With improved flow in the right posterior cerebral artery, I elected to complete the procedure at this point. The guide catheter and guide sheath were then synchronously removed without incident. FINDINGS: Right internal carotid, head: Injection reveals the presence of a widely patent ICA, M1, and A1 segments and their branches. No aneurysms, AVMs, or high-flow fistulas are seen. Of note, the posterior communicating artery is very small and no visualization of the posterior circulation is noted. The parenchymal and venous phases are normal. The venous sinuses are widely patent. Left internal carotid, head: Injection reveals the presence of a widely patent ICA, A1, and M1 segments and their branches. No aneurysms, AVMs, or high-flow fistulas are seen. Similar to the  contralateral side, there is no significant visualization of the left posterior communicating artery. The parenchymal and venous phases are normal. The venous sinuses are widely patent. Left vertebral: Injection reveals the presence of a widely patent vertebral artery. This leads to a widely patent basilar artery that terminates in bilateral P1. 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 This aneurysm measures approximately 6.8 x 5.8 mm with a neck measuring approximately 4.5 mm. The parenchymal and venous phases are normal. The venous sinuses are widely patent. Right vertebral: The vertebral artery is widely patent. No PICA aneurysm is seen. See basilar description above. Right vertebral artery, head (during embolization) Angiograms taken during coiling of the basilar aneurysm through the right vertebral artery access demonstrate progressive occlusion primarily of the dome of the aneurysm. The right vertebral artery and bilateral posterior cerebral arteries appear to be widely patent. There is residual aneurysm both at the neck and centrally within the aneurysm. No filling defects are noted. Right vertebral artery, head (post coiling) Angiogram taken after coiling through the right vertebral artery reveal occlusion of the dome of the aneurysm although there is residual aneurysm centrally and at the neck. There are small coil loops which appear to be in proximity to the origin of the right posterior cerebral artery. No filling defects are seen however flow through the right posterior cerebral artery is delayed in comparison to the left PCA, as well as in comparison to earlier angiograms suggesting flow limitation. Left vertebral artery, head (  pre stent) Angiogram taken through the guide catheter in the left vertebral artery again demonstrates coil mass within the aneurysm with the residual filling as described  above. Again seen is delayed filling of the right posterior cerebral artery with decreased perfusion in comparison to prior angiograms. Again, no filling defects are seen to suggest thrombus. Left vertebral artery, head (immediate post stent) Angiogram reveals widely patent left vertebral artery with stent extending from the mid basilar into the right P2 segment. No filling defects are seen. Flow within the right posterior cerebral artery appears to be improved in comparison to the pre stent angiogram with more symmetric filling of the bilateral posterior cerebral arteries. Left vertebral artery, head (during coiling) Angiogram reveals further coil mass within the residual aneurysm which is progressively occluded. No filling defects are noted. The posterior cerebral arteries remain patent. Left vertebral artery, head (immediate post coiling) Angiogram taken from the left vertebral artery reveals patent basilar artery. The left posterior cerebral artery remains widely patent. Stent is in stable position. There is coil mass within the aneurysm, with minimal neck residual. There is decreased flow through the right posterior cerebral artery with filling defect in the inferior wall of the right P1 adjacent to the distal aneurysm neck likely representing thrombus. There is decreased perfusion of the right posterior cerebral artery territory in comparison to the prior angiograms. Left vertebral artery, head (post thrombolysis) Angiograms taken 10 minutes after initial 8 mg bolus dose and taken again 10 minutes after the second 8 mg bolus dose sequentially reveal improvement in flow through the right posterior cerebral artery. There is also much smaller filling defect along the wall of the posterior cerebral artery adjacent to the coil mass indicating dissolution of some of the thrombus. The final angiogram does show minimally decreased flow through the right posterior cerebral artery with significantly improved perfusion  of the right PCA territory in comparison to the previous angiograms. Venous sinuses remain widely patent. DISPOSITION: Upon completion of the study, the femoral sheath was secured in place with nylon suture and connected to a pressure system. The patient was extubated and transferred to the postanesthesia care unit in stable hemodynamic condition. IMPRESSION: 1. 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 Procedure was complicated by stent thrombosis treated with intra arterial and intravenous Integrilin. The preliminary results of this procedure were shared with the patient's family. Electronically Signed   By: Consuella Lose   On: 11/19/2020 20:28   IR ANGIO VERTEBRAL SEL VERTEBRAL BILAT MOD SED  Result Date: 11/19/2020 PROCEDURE: DIAGNOSTIC CEREBRAL ANGIOGRAM COIL EMBOLIZATION OF BASILAR ANEURYSM HISTORY: The patient is a 60 year old woman presenting to the emergency department with several episodes of diplopia and approximally 1 week of significantly worsening headache. 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 She was therefore admitted to the hospital. After review of CTA it was felt that she would likely be a candidate for stent supported coiling. It was felt that her aneurysm was likely the cause of her new symptoms likely due to enlargement and irritation of the oculomotor nerve. Patient was loaded with 300 mg of Plavix and 325 mg of aspirin. ACCESS: The technical aspects of the procedure as well as its potential risks and benefits were reviewed with the patient and patient's family. These risks included but were not limited to stroke, intracranial hemorrhage, bleeding, infection, allergic reaction, damage to organs or vital  structures, stroke, non-diagnostic procedure, and the catastrophic outcomes of heart attack, coma, and death.  With an understanding of these risks, informed consent was obtained and witnessed. The patient was placed in the supine position on the angiography table and the skin of right groin prepped in the usual sterile fashion. The procedure was performed under general anesthesia. A 5-French sheath was introduced in the right common femoral artery using Seldinger technique. MEDICATIONS: HEPARIN: 8000 Units total. INTEGRILIN: 16mg  IA, 43mcg/kg/min IV continuous infusion CONTRAST:  60mL OMNIPAQUE IOHEXOL 300 MG/ML SOLN, 78mL OMNIPAQUE IOHEXOL 300 MG/ML SOLN, 77mL OMNIPAQUE IOHEXOL 300 MG/ML SOLNcc, Omnipaque 300 FLUOROSCOPY TIME:  FLUOROSCOPY TIME: See IR records TECHNIQUE: CATHETERS AND WIRES 5-French JB-1 catheter 180 cm 0.035" glidewire 6-French NeuronMax guide sheath 6-French Berenstein Select JB-1 catheter 0.070 Sofia guide catheter 0.058" CatV guidecatheter Synchro select standard microwire Synchro select soft microwire As[employer] Chikai 10 microwire Excelsior XT-17 microcatheter x 2 COILS/STENTS USED Target 3D 6 mm x 15 cm Target 360 XL soft 5 mm by 10 cm Target 360 XL 4 mm x 12 cm Target 360 XL 3 mm x 9 cm not deployed Target 360 ultra 2 mm x 4 cm Target 360 ultra 2.5 mm x 4 cm x2 Target 360 ultra 2 mm x 4 cm x2 Neuroform Atlas 3.0 mm x 24 mm VESSELS CATHETERIZED Right internal carotid Left internal carotid Left vertebral Right vertebral Right common femoral Basilar artery Right posterior cerebral artery VESSELS STUDIED Right internal carotid, head Left internal carotid, head Left vertebral Right vertebral Right vertebral artery, head (during embolization) Right vertebral artery, head (post-coiling) Left vertebral artery, head (pre stent) Left vertebral artery, head (immediate post stent) Left vertebral artery, head (during coiling) Left vertebral artery, head (immediate post coiling) Left vertebral artery, head (post thrombolysis) PROCEDURAL NARRATIVE A 5-Fr JB-1 glide catheter was advanced over a 0.035 glidewire into the  aortic arch. The above vessels were then sequentially catheterized and cervical / cerebral angiograms taken. After review of images, the catheter was removed without incident. The 5-Fr sheath was then exchanged over the glidewire for an 8-Fr sheath. Under real-time fluoroscopy, the guide sheath was advanced over the select catheter and glidewire into the descending aorta. 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 The guide sheath was then advanced into the proximal right vertebral artery. The Select catheter was removed and the 070 guide catheter was coaxially introduced over both the XT-17 micro catheters and synchro select micro wires. The microcatheters were then advanced under roadmap guidance into the distal cervical vertebral artery. The guide catheter was then tracked over the microcatheter into the distal vertebral artery. The micro catheters were then further advanced into the proximal basilar artery. This allowed advancement of the guide catheter into the proximal basilar artery. Initially, multiple attempts were made to access the right posterior cerebral artery unsuccessfully. This included a combination of different shape microcatheter tip. I then attempted access with the Chikai 10 microwire again with multiple different tip configurations all unsuccessfully. At this point I elected to attempt to place coils within the aneurysm without the aid of the stent as it did not appear access to the right posterior cerebral artery would be feasible. The three-dimensional coil as well as several filling coils were placed into the aneurysm. There is a small amount of coil loop extending down near the origin of the right posterior cerebral artery. As the last coil was being placed, some coil  loops appear to be extending down further to words the origin of the posterior cerebral arteries and I therefore elected to stop  coiling at this point. Angiogram was taken and the micro catheters were withdrawn down into the guide catheter. Guide catheter was then withdrawn down into the cervical vertebral artery and angiogram was taken. This did reveal decreased flow through the right posterior cerebral artery suggesting some compromise of the ostium possibly due to coil mass. I therefore elected to attempt to place a stent by accessing the left vertebral artery. The guide catheter was removed, and the guide sheath was withdrawn down into the descending aorta as was the guide sheath. The select catheter was introduced over the Glidewire, and the left subclavian followed by the proximal left vertebral artery were selected. Guide sheath was then advanced into the proximal left vertebral artery. Under roadmap guidance, the 058 CT 5 guide catheter was introduced over and XT 17 microcatheter and synchro select microwire. These were then navigated until the CAT 5 guide catheter was in the proximal basilar artery. The synchro select microwire was then used to access the right posterior cerebral artery. I was able to gain access to this vessel by buttressing against the coil mass already placed in the aneurysm. Microcatheter was then tracked into the P2 segment. Microwire was removed, and the above stent was introduced. Stent was then deployed under roadmap guidance without incident. The microcatheter was then advanced over the pushing wire into the lumen of the stent. Angiogram was then taken. It did appear that flow through the right posterior cerebral artery was improved in comparison to the prior angiogram. With the posterior cerebral artery somewhat protected, we elected to attempt to coil more of the residual aneurysm. The microwire was introduced into the microcatheter, and advanced into the lumen of the aneurysm through the stent. Microcatheter was then advanced into the residual aneurysm. The remainder of the above coils were then  sequentially placed. After several coils were placed, I elected to complete the coiling procedure as I did not want to risk coil herniation through the tines of the stent and further compromise the posterior cerebral arteries. The coiling catheter was withdrawn, and the guide catheter was withdrawn down into the cervical vertebral artery. At this point, angiogram was taken which appear to reveal more decreased flow in the right posterior cerebral artery and what appear to be filling defect in the inferior wall of the right posterior cerebral artery adjacent to the coil mass. Therefore under roadmap guidance, I advanced the microcatheter over the microwire back into the lumen of the stent. I then administered a 8 mg bolus dose of Integrilin intra-arterially. After approximately 10 minutes, angiogram was taken which did reveal somewhat increased flow through the right posterior cerebral artery and better perfusion. I then administered another 8 mg bolus dose intra-arterially. After another 10 minutes, another angiogram was taken again revealing improved flow through the right posterior cerebral artery. At this point, the pharmacy was able to deliver intravenous Integrilin which was started at a rate of 2 micrograms/kg/minute with the plan to continue for 18 hours. With improved flow in the right posterior cerebral artery, I elected to complete the procedure at this point. The guide catheter and guide sheath were then synchronously removed without incident. FINDINGS: Right internal carotid, head: Injection reveals the presence of a widely patent ICA, M1, and A1 segments and their branches. No aneurysms, AVMs, or high-flow fistulas are seen. Of note, the posterior communicating artery is very  small and no visualization of the posterior circulation is noted. The parenchymal and venous phases are normal. The venous sinuses are widely patent. Left internal carotid, head: Injection reveals the presence of a widely patent  ICA, A1, and M1 segments and their branches. No aneurysms, AVMs, or high-flow fistulas are seen. Similar to the contralateral side, there is no significant visualization of the left posterior communicating artery. The parenchymal and venous phases are normal. The venous sinuses are widely patent. Left vertebral: Injection reveals the presence of a widely patent vertebral artery. This leads to a widely patent basilar artery that terminates in bilateral P1. 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 This aneurysm measures approximately 6.8 x 5.8 mm with a neck measuring approximately 4.5 mm. The parenchymal and venous phases are normal. The venous sinuses are widely patent. Right vertebral: The vertebral artery is widely patent. No PICA aneurysm is seen. See basilar description above. Right vertebral artery, head (during embolization) Angiograms taken during coiling of the basilar aneurysm through the right vertebral artery access demonstrate progressive occlusion primarily of the dome of the aneurysm. The right vertebral artery and bilateral posterior cerebral arteries appear to be widely patent. There is residual aneurysm both at the neck and centrally within the aneurysm. No filling defects are noted. Right vertebral artery, head (post coiling) Angiogram taken after coiling through the right vertebral artery reveal occlusion of the dome of the aneurysm although there is residual aneurysm centrally and at the neck. There are small coil loops which appear to be in proximity to the origin of the right posterior cerebral artery. No filling defects are seen however flow through the right posterior cerebral artery is delayed in comparison to the left PCA, as well as in comparison to earlier angiograms suggesting flow limitation. Left vertebral artery, head (pre stent) Angiogram taken through the guide catheter in  the left vertebral artery again demonstrates coil mass within the aneurysm with the residual filling as described above. Again seen is delayed filling of the right posterior cerebral artery with decreased perfusion in comparison to prior angiograms. Again, no filling defects are seen to suggest thrombus. Left vertebral artery, head (immediate post stent) Angiogram reveals widely patent left vertebral artery with stent extending from the mid basilar into the right P2 segment. No filling defects are seen. Flow within the right posterior cerebral artery appears to be improved in comparison to the pre stent angiogram with more symmetric filling of the bilateral posterior cerebral arteries. Left vertebral artery, head (during coiling) Angiogram reveals further coil mass within the residual aneurysm which is progressively occluded. No filling defects are noted. The posterior cerebral arteries remain patent. Left vertebral artery, head (immediate post coiling) Angiogram taken from the left vertebral artery reveals patent basilar artery. The left posterior cerebral artery remains widely patent. Stent is in stable position. There is coil mass within the aneurysm, with minimal neck residual. There is decreased flow through the right posterior cerebral artery with filling defect in the inferior wall of the right P1 adjacent to the distal aneurysm neck likely representing thrombus. There is decreased perfusion of the right posterior cerebral artery territory in comparison to the prior angiograms. Left vertebral artery, head (post thrombolysis) Angiograms taken 10 minutes after initial 8 mg bolus dose and taken again 10 minutes after the second 8 mg bolus dose sequentially reveal improvement in flow through the right posterior cerebral artery. There is  also much smaller filling defect along the wall of the posterior cerebral artery adjacent to the coil mass indicating dissolution of some of the thrombus. The final angiogram does  show minimally decreased flow through the right posterior cerebral artery with significantly improved perfusion of the right PCA territory in comparison to the previous angiograms. Venous sinuses remain widely patent. DISPOSITION: Upon completion of the study, the femoral sheath was secured in place with nylon suture and connected to a pressure system. The patient was extubated and transferred to the postanesthesia care unit in stable hemodynamic condition. IMPRESSION: 1. 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 Procedure was complicated by stent thrombosis treated with intra arterial and intravenous Integrilin. The preliminary results of this procedure were shared with the patient's family. Electronically Signed   By: Consuella Lose   On: 11/19/2020 20:28    Microbiology: Recent Results (from the past 240 hour(s))  Resp Panel by RT-PCR (Flu A&B, Covid) Nasopharyngeal Swab     Status: None   Collection Time: 12/16/20 10:50 PM   Specimen: Nasopharyngeal Swab; Nasopharyngeal(NP) swabs in vial transport medium  Result Value Ref Range Status   SARS Coronavirus 2 by RT PCR NEGATIVE NEGATIVE Final    Comment: (NOTE) SARS-CoV-2 target nucleic acids are NOT DETECTED.  The SARS-CoV-2 RNA is generally detectable in upper respiratory specimens during the acute phase of infection. The lowest concentration of SARS-CoV-2 viral copies this assay can detect is 138 copies/mL. A negative result does not preclude SARS-Cov-2 infection and should not be used as the sole basis for treatment or other patient management decisions. A negative result may occur with  improper specimen collection/handling, submission of specimen other than nasopharyngeal swab, presence of viral mutation(s) within the areas targeted by this assay, and inadequate number of viral copies(<138 copies/mL). A negative result must be combined with clinical observations,  patient history, and epidemiological information. The expected result is Negative.  Fact Sheet for Patients:  EntrepreneurPulse.com.au  Fact Sheet for Healthcare Providers:  IncredibleEmployment.be  This test is no t yet approved or cleared by the [place] FDA and  has been authorized for detection and/or diagnosis of SARS-CoV-2 by FDA under an Emergency Use Authorization (EUA). This EUA will remain  in effect (meaning this test can be used) for the duration of the COVID-19 declaration under Section 564(b)(1) of the Act, 21 U.S.C.section 360bbb-3(b)(1), unless the authorization is terminated  or revoked sooner.       Influenza A by PCR NEGATIVE NEGATIVE Final   Influenza B by PCR NEGATIVE NEGATIVE Final    Comment: (NOTE) The Xpert Xpress SARS-CoV-2/FLU/RSV plus assay is intended as an aid in the diagnosis of influenza from Nasopharyngeal swab specimens and should not be used as a sole basis for treatment. Nasal washings and aspirates are unacceptable for Xpert Xpress SARS-CoV-2/FLU/RSV testing.  Fact Sheet for Patients: EntrepreneurPulse.com.au  Fact Sheet for Healthcare Providers: IncredibleEmployment.be  This test is not yet approved or cleared by the [place] FDA and has been authorized for detection and/or diagnosis of SARS-CoV-2 by FDA under an Emergency Use Authorization (EUA). This EUA will remain in effect (meaning this test can be used) for the duration of the COVID-19 declaration under Section 564(b)(1) of the Act, 21 U.S.C. section 360bbb-3(b)(1), unless the authorization is terminated or revoked.  Performed at  [facility] [place] [address] [place], [place] [address]      Labs: Basic Metabolic Panel: Recent Labs  Lab 12/16/20 2300 12/16/20 2317 12/17/20 0530 12/18/20 0149  NA 139 144 141 141  K 3.4* 3.4* 4.0 3.5  CL 106 106 109 106  CO2 24  --  24 28  GLUCOSE  118* 116* 107* 144*  BUN 15 17 13 11   CREATININE 0.78 0.70 0.69 0.69  CALCIUM 8.7*  --  9.1 9.2  MG  --   --  2.0  --    Liver Function Tests: Recent Labs  Lab 12/16/20 2300 12/17/20 0530  AST 29 26  ALT 27 27  ALKPHOS 63 65  BILITOT 0.5 0.4  PROT 6.6 6.7  ALBUMIN 3.6 3.7   No results for input(s): LIPASE, AMYLASE in the last 168 hours. No results for input(s): AMMONIA in the last 168 hours. CBC: Recent Labs  Lab 12/16/20 2300 12/16/20 2317 12/17/20 0530  WBC 6.7  --  7.1  NEUTROABS 3.7  --   --   HGB 13.0 12.6 13.6  HCT 38.5 37.0 41.1  MCV 91.0  --  92.2  PLT 213  --  218   Cardiac Enzymes: No results for input(s): CKTOTAL, CKMB, CKMBINDEX, TROPONINI in the last 168 hours. BNP: BNP (last 3 results) No results for input(s): BNP in the last 8760 hours.  ProBNP (last 3 results) No results for input(s): PROBNP in the last 8760 hours.  CBG: Recent Labs  Lab 12/16/20 2247  GLUCAP 126*       Signed:  Irine Seal MD.  [facility] 12/18/2020, 10:41 AM

## 2020-12-21 ENCOUNTER — Other Ambulatory Visit: Payer: Self-pay

## 2020-12-21 ENCOUNTER — Ambulatory Visit: Payer: BC Managed Care – PPO | Attending: Neurosurgery | Admitting: Physical Therapy

## 2020-12-21 ENCOUNTER — Encounter: Payer: Self-pay | Admitting: Physical Therapy

## 2020-12-21 ENCOUNTER — Ambulatory Visit: Payer: BC Managed Care – PPO

## 2020-12-21 VITALS — BP 118/96

## 2020-12-21 DIAGNOSIS — M6281 Muscle weakness (generalized): Secondary | ICD-10-CM | POA: Insufficient documentation

## 2020-12-21 DIAGNOSIS — R42 Dizziness and giddiness: Secondary | ICD-10-CM | POA: Diagnosis present

## 2020-12-21 DIAGNOSIS — R41841 Cognitive communication deficit: Secondary | ICD-10-CM

## 2020-12-21 DIAGNOSIS — R2689 Other abnormalities of gait and mobility: Secondary | ICD-10-CM | POA: Insufficient documentation

## 2020-12-21 DIAGNOSIS — R4701 Aphasia: Secondary | ICD-10-CM

## 2020-12-21 DIAGNOSIS — R2681 Unsteadiness on feet: Secondary | ICD-10-CM | POA: Insufficient documentation

## 2020-12-21 NOTE — Therapy (Signed)
[facility] [address], [place], [place], [address] Phone: [phone]   Fax:  [phone]  Physical Therapy Evaluation  Patient Details  Name: Tiffany Horn MRN: [id number] Date of Birth: 1960-02-12 Referring Provider (PT): Kathyrn Sheriff   Encounter Date: 12/21/2020   PT End of Session - 12/21/20 1525     Visit Number 1    Number of Visits 17    Date for PT Re-Evaluation 02/18/21    Authorization Type BC[employer]    PT Start Time 1405    PT Stop Time 2248    PT Time Calculation (min) 40 min    Activity Tolerance Patient tolerated treatment well;Patient limited by fatigue   rates dizziness symptoms as 7/10 with FGA; after quiet rest with eval explanation rates symptoms as 4/10            Past Medical History:  Diagnosis Date   Allergy    Anxiety    Arthritis    knees and spine, shoulder   Asthma    Bronchitis    hx - recurrent   Complication of anesthesia    waking up is not easy   Depression    Elevated IgE level 09/12/2017   09/12/2017 IgE 195   H/O miscarriage, not currently pregnant    Hx of irritable bowel syndrome    x2   Hyperlipidemia    diet controlled - no medication   Hypertension    not taking any meds at present - under control per patient   Hypokalemia    with PNA admission (2.5)   Ischemic cerebrovascular accident (CVA) ([place])    Migraines    Pneumonia    4 episodes; hosp. admission 2014   PONV (postoperative nausea and vomiting)    Thyroid disease    UC (ulcerative colitis) [facility])    DX'D 2021    Past Surgical History:  Procedure Laterality Date   BREAST SURGERY     implants, then had them removed   COLONOSCOPY     greater 10 yrs ago - ? [facility]-2017 LAST   DILATION AND CURETTAGE OF UTERUS     IR ANGIO INTRA EXTRACRAN SEL INTERNAL CAROTID BILAT MOD SED  11/19/2020   IR ANGIO VERTEBRAL SEL VERTEBRAL BILAT MOD SED  11/19/2020   IR ANGIOGRAM FOLLOW UP STUDY  11/19/2020   IR ANGIOGRAM  FOLLOW UP STUDY  11/19/2020   IR ANGIOGRAM FOLLOW UP STUDY  11/19/2020   IR ANGIOGRAM FOLLOW UP STUDY  11/19/2020   IR ANGIOGRAM FOLLOW UP STUDY  11/19/2020   IR ANGIOGRAM FOLLOW UP STUDY  11/19/2020   IR ANGIOGRAM FOLLOW UP STUDY  11/19/2020   IR ANGIOGRAM FOLLOW UP STUDY  11/19/2020   IR INTRA CRAN STENT  11/19/2020   IR TRANSCATH/EMBOLIZ  11/19/2020   LAPAROSCOPIC ABDOMINAL EXPLORATION  01/31/1992   endometriosis   ORIF HUMERUS FRACTURE Left 04/01/2013   DR Ninfa Linden - shoulder   ORIF HUMERUS FRACTURE Left 04/01/2013   Procedure: OPEN REDUCTION INTERNAL FIXATION (ORIF) LEFT PROXIMAL HUMERUS FRACTURE;  Surgeon: Mcarthur Rossetti, MD;  Location: [place];  Service: Orthopedics;  Laterality: Left;   RADIOLOGY WITH ANESTHESIA N/A 11/19/2020   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;  Surgeon: Consuella Lose, MD;  Location: [place];  Service: Radiology;  Laterality: N/A;   TONSILLECTOMY AND ADENOIDECTOMY      Vitals:   12/21/20 1424  BP: (!) 118/96      Subjective Assessment - 12/21/20 1409  Subjective Pt reports she mostly wants to focus on balance.  She reports feeling unsteady, has been avoiding going up and down stairs.  Sometimes I stumble; L side is definitely the deficit side.  Rounding corners at school make me feel unsteady, when I get tired, I feel more unsteady.  Had the AAA coiling procedure and Dr. Kathyrn Sheriff reported to expect some of the dizziness/vertigo in the first 3 months.    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  ED/hospital 11/5-11/7/22; ED 12/16/20 with blurred vision and L sided weakness; imaging shows evolution of R PCA infarct and subacute infarct R cerebellum)    Patient Stated Goals Pt's goals for therapy are to build strength in L side, work on balance.    Currently in Pain? No/denies                 [facility] PT Assessment - 12/21/20 1413       Assessment   Medical Diagnosis CVA    Referring Provider (PT) Kathyrn Sheriff    Onset Date/Surgical Date 11/19/20    Hand Dominance Right      Precautions   Precautions Fall    Precaution Comments Limited work day (approx 3 hours per MD); pay attention to body signals-rest before headache, fatigue comes on      Balance Screen   Has the patient fallen in the past 6 months Yes    How many times? 2    Has the patient had a decrease in activity level because of a fear of falling?  No    Is the patient reluctant to leave their home because of a fear of falling?  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      Transfers   Transfers Sit to Stand;Stand to Sit    Sit to Stand 7: Independent    Stand to Sit 7: Independent      Ambulation/Gait    Ambulation/Gait Yes    Ambulation/Gait Assistance 6: Modified independent (Device/Increase time)    Ambulation Distance (Feet) 60 Feet   x 4   Assistive  device None    Gait Pattern Step-through pattern;Decreased arm swing - right;Decreased arm swing - left   guarded gait pattern   Ambulation Surface Level;Indoor    Gait velocity 10.53 sec = 3.11 ft/sec      Standardized Balance Assessment   Standardized Balance Assessment --      Functional Gait  Assessment   Gait assessed  Yes    Gait Level Surface Walks 20 ft in less than 7 sec but greater than 5.5 sec, uses assistive device, slower speed, mild gait deviations, or deviates 6-10 in outside of the 12 in walkway width.    Change in Gait Speed Able to smoothly change walking speed without loss of balance or gait deviation. Deviate no more than 6 in outside of the 12 in walkway width.    Gait with Horizontal Head Turns Performs head turns smoothly with no change in gait. Deviates no more than 6 in outside 12 in walkway width   6.88; rates as 7/10   Gait with Vertical Head Turns Performs task with slight change in gait velocity (eg, minor disruption to smooth gait path), deviates 6 - 10 in outside 12 in walkway width or uses assistive device    Gait and Pivot Turn Pivot turns safely in greater than 3 sec and stops with no loss of balance, or pivot turns safely within 3 sec and stops with mild imbalance, requires small steps to catch balance.    Step Over Obstacle Is able to step over one shoe box (4.5 in total height) without changing gait speed. No evidence of imbalance.    Gait with Narrow Base of Support Is able to ambulate for 10 steps heel to toe with no staggering.    Gait with Eyes Closed Walks 20 ft, slow speed, abnormal gait pattern, evidence for imbalance, deviates 10-15 in outside 12 in walkway width. Requires more than 9 sec to ambulate 20 ft.   12.38 sec, veers to L   Ambulating Backwards Walks 20 ft, uses assistive device, slower  speed, mild gait deviations, deviates 6-10 in outside 12 in walkway width.   17.22   Steps Alternating feet, must use rail.    Total Score 22    FGA comment: Scores <22/30 indicate increased fall risk.                    Vestibular Assessment - 12/21/20 0001       Vestibular Assessment   General Observation Slightly guarded with giat and turns      Symptom Behavior   Subjective history of current problem Began after AAA coiling procedure 1 month ago.  Typically, pt reports that when she gets fatigued (visual fatigue, light,and head motions), she begins to get headache, then increased dizziness, then more unsteadiness    Type of Dizziness  Imbalance   Swimmy-headed   Frequency of Dizziness comes with head motions, rolling R and L    Duration of Dizziness under a minute    Symptom Nature Motion provoked    Aggravating Factors Looking up to the ceiling;Turning head sideways;Turning head quickly;Turning body quickly;Rolling to right;Rolling to left   Looking down, more with fatigue   Relieving Factors Lying supine;Dark room;Closing eyes;Avoiding busy/distracting areas    Progression of Symptoms --   Variable day to day.  During PT eval today, with FGA, pt reports 7/10 dizziness and after approx 5 minutes lying supine in darker room, 4/10 symptoms.  Objective measurements completed on examination: See above findings.                PT Education - 12/21/20 1525     Education Details PT eval results, POC    Person(s) Educated Patient    Methods Explanation    Comprehension Verbalized understanding              PT Short Term Goals - 12/21/20 1533       PT SHORT TERM GOAL #1   Title Pt will be independent with HEP for improved strength, balance, and gait.  TARGET 01/21/2021    Time 4    Period Weeks    Status New      PT SHORT TERM GOAL #2   Title Pt will rate dizziness/unsteadiness as less than or equal to 4/10 with functional gait  activities.    Baseline 7/10 with FGA    Time 4    Period Weeks    Status New               PT Long Term Goals - 12/21/20 1534       PT LONG TERM GOAL #1   Title Pt will be independent with HEP for improved strength, balance, and gait.  TARGET 02/18/2021    Time 8    Period Weeks    Status New      PT LONG TERM GOAL #2   Title Pt will improve FGA to at least 25/30 for decreased fall risk.    Baseline 22/30    Time 8    Period Weeks    Status New      PT LONG TERM GOAL #3   Title Pt will rate dizziness/unsteadiness symtpoms as less than or equal to 2/10 with functional gait activiteis.    Time 8    Period Weeks    Status New      PT LONG TERM GOAL #4   Title Pt will verbalize understanding of fall prevention in home environment.    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  [facility] with L sided weakness and blurry vision with acute ischemia in R thalamus and R occipital lobe noted on MRI.  She also presented to ED/hospital 11/5-11/7/22 and ED 12/16/20 with blurred vision and L sided weakness; imaging shows evolution of R PCA infarct and subacute infarct R cerebellum.  She reports symptoms of dizziness that increase with head motions, with fatigue, and with increased activity, during eval today up to 7/10 rating of symptoms.  She is at increased fall risk per FGA score of 22/30.  She presents with decreased functional strength noted in LLE, decreased balance, decreased gait independence, dizziness/unsteadiness with motion sensitivity.  Will need further oculomotor assessment, as did not proceed todya, after her dizziness/symptom rating was 7/10 with FGA activitiy, knowing she had speech therapy eval directly after PT.  She is active as a Pharmacist, hospital  and would benefit from skilled PT to address the above stated deficits to decrease fall risk and improve overall functional mobility and independence.    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    Examination-Activity Limitations Locomotion Level;Bed Mobility;Stairs;Stand    Examination-Participation Restrictions Occupation;Community Activity    Stability/Clinical Decision Making Evolving/Moderate complexity    Clinical Decision Making Moderate    Rehab Potential Good    PT Frequency 2x / week    PT Duration 8 weeks   plus eval   PT Treatment/Interventions ADLs/Self Care Home Management;Gait training;Functional mobility training;Stair training;Therapeutic activities;Therapeutic exercise;Balance training;Neuromuscular re-education;Patient/family education;Vestibular    PT Next Visit Plan Assess oculomotor testing and initiate HEP; check tandem and SLS and address balance with HEP.    Consulted and Agree with Plan of Care Patient             Patient will benefit from skilled therapeutic intervention in order to improve the following deficits and impairments:  Abnormal gait, Difficulty walking, Dizziness, Decreased balance, Decreased mobility, Decreased strength  Visit Diagnosis: Unsteadiness on feet  Other abnormalities of gait and mobility  Dizziness and giddiness  Muscle weakness (generalized)     Problem List Patient Active Problem List   Diagnosis Date Noted   Acute CVA (cerebrovascular accident) ([place])    TIA (transient ischemic attack) 12/17/2020   Hypokalemia 12/17/2020   Moderate persistent asthma 12/17/2020   Hypertension    CVA (cerebral vascular accident) ([place]) 12/04/2020   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[place]) 11/17/2020   Family history of heart disease 09/03/2019   Mild intermittent asthma 07/25/2019   Osteoporosis  06/12/2019   Elevated LFTs 06/06/2019   Hematuria 06/06/2019   Elevated IgE level 09/12/2017   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) 09/12/2017   Headache, migraine 02/15/2015   Vitamin D deficiency 10/19/2014   Encounter for preventive health examination 10/19/2014   Depression with anxiety 09/02/2014   Dyspnea on exertion 08/26/2014   Hyperlipidemia 08/18/2014   Hypothyroidism 08/18/2014   Obesity (BMI 30-39.9) 08/18/2014    Veretta Sabourin W., PT 12/21/2020, 3:36 PM  [facility] [address] [place], [place], [address] Phone: [phone]   Fax:  [phone]  Name: Mira Balon MRN: [id number] Date of Birth: Oct 15, 1960

## 2020-12-21 NOTE — Therapy (Addendum)
[facility] [place], [place], [place], [address] Phone: [phone]   Fax:  [phone]  Speech Language Pathology Evaluation  Patient Details  Name: Tiffany Horn MRN: [id number] Date of Birth: 06-09-1960 Referring Provider (SLP): Wendee Beavers MD (referring), Howard Pouch A NP (documentation)   Encounter Date: 12/21/2020   End of Session - 12/21/20 1559     Visit Number 1    Number of Visits 19    Date for SLP Re-Evaluation 02/25/21   added one week due to holiday   SLP Start Time 1450    SLP Stop Time  1530    SLP Time Calculation (min) 40 min    Activity Tolerance Patient tolerated treatment well             Past Medical History:  Diagnosis Date   Allergy    Anxiety    Arthritis    knees and spine, shoulder   Asthma    Bronchitis    hx - recurrent   Complication of anesthesia    waking up is not easy   Depression    Elevated IgE level 09/12/2017   09/12/2017 IgE 195   H/O miscarriage, not currently pregnant    Hx of irritable bowel syndrome    x2   Hyperlipidemia    diet controlled - no medication   Hypertension    not taking any meds at present - under control per patient   Hypokalemia    with PNA admission (2.5)   Ischemic cerebrovascular accident (CVA) ([place])    Migraines    Pneumonia    4 episodes; hosp. admission 2014   PONV (postoperative nausea and vomiting)    Thyroid disease    UC (ulcerative colitis) [facility])    DX'D 2021    Past Surgical History:  Procedure Laterality Date   BREAST SURGERY     implants, then had them removed   COLONOSCOPY     greater 10 yrs ago - ? [facility]-2017 LAST   DILATION AND CURETTAGE OF UTERUS     IR ANGIO INTRA EXTRACRAN SEL INTERNAL CAROTID BILAT MOD SED  11/19/2020   IR ANGIO VERTEBRAL SEL VERTEBRAL BILAT MOD SED  11/19/2020   IR ANGIOGRAM FOLLOW UP STUDY  11/19/2020   IR ANGIOGRAM FOLLOW UP STUDY  11/19/2020   IR ANGIOGRAM FOLLOW UP STUDY   11/19/2020   IR ANGIOGRAM FOLLOW UP STUDY  11/19/2020   IR ANGIOGRAM FOLLOW UP STUDY  11/19/2020   IR ANGIOGRAM FOLLOW UP STUDY  11/19/2020   IR ANGIOGRAM FOLLOW UP STUDY  11/19/2020   IR ANGIOGRAM FOLLOW UP STUDY  11/19/2020   IR INTRA CRAN STENT  11/19/2020   IR TRANSCATH/EMBOLIZ  11/19/2020   LAPAROSCOPIC ABDOMINAL EXPLORATION  01/31/1992   endometriosis   ORIF HUMERUS FRACTURE Left 04/01/2013   DR Ninfa Linden - shoulder   ORIF HUMERUS FRACTURE Left 04/01/2013   Procedure: OPEN REDUCTION INTERNAL FIXATION (ORIF) LEFT PROXIMAL HUMERUS FRACTURE;  Surgeon: Mcarthur Rossetti, MD;  Location: [place];  Service: Orthopedics;  Laterality: Left;   RADIOLOGY WITH ANESTHESIA N/A 11/19/2020   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;  Surgeon: Consuella Lose, MD;  Location: [place];  Service: Radiology;  Laterality: N/A;   TONSILLECTOMY AND ADENOIDECTOMY      There were no vitals filed for this visit.   Subjective Assessment - 12/21/20 1613     Subjective Pt reports both difficulty with word finding/sentence construction as well as  memory. Pt currently using many compensations for memory deficit.    Currently in Pain? No/denies                SLP Evaluation [facility] - 12/21/20 1613       SLP Visit Information   SLP Received On 12/21/20    Referring Provider (SLP) Wendee Beavers MD (referring), Howard Pouch A NP (documentation)    Onset Date 11-16-20    Medical Diagnosis CVAs      Subjective   Patient/Family Stated Goal improve expressive language and memory skills      General Information   HPI Pt is 60 yo female who presented to the ED 11-16-20 with L sided weakness and blurry vision. Aneurysm noted from CT, and pt had coiling sx on 11-19-20. Pt returned to ED on 12-04-20 with CVA symptoms for a few days; just prior to ED visit, an MRI was taken 12-02-20 and found small foci of acute ischemia in the right thalamus and right occipital lobe, both within the right PCA  territory. Lastly, pt with CVA sx again on 12-17-20 so ED visit and MRI located two new small rt sided cerebellar infarcts.  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    Mobility Status pt with PT eval today      Prior Functional Status   Cognitive/Linguistic Baseline Within functional limits    Type of Home House     Lives With Alone    Available Support Family;Friend(s)    Vocation Part time employment   tutor at [employer]   Overall Cognitive Status Impaired/Different from baseline    Area of Impairment Memory    Memory Decreased short-term memory    Memory Comments During the Self Administered Gerocognitive Examination(SAGE) pt was asked to write a 5-sentence thank you note to someone who brought her something when she returned from the hospital on the back page. She began to write and asked the SLP how many sentences she had to write. Her paragraph thanked the person in third-person form, and she did not write a thank you note to the person who helped. "I didn't hear that correctly, did I?" pt stated.      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 Today she wrote a thank you note without difficulty. SLP will need to assess writing more indepth with more complex tasks.      Motor Speech   Overall Motor Speech Appears within functional limits for tasks assessed      Standardized Assessments   Standardized Assessments  [place] Naming Test-2nd edition   50/60 - which is outside WNL (51.2/60). Mild deficit                             SLP Education - 12/21/20 1543     Education Details eval results, VNeST procedure    Person(s) Educated Patient    Methods Explanation;Demonstration    Comprehension Verbalized understanding;Returned demonstration;Need further instruction              SLP Short  Term Goals - 12/21/20 1720       SLP SHORT TERM GOAL #1   Title pt will tell SLP 4 practical compensatory measures she could use for memory in 2 sessions    Time 4    Period Weeks    Status New    Target Date 01/14/21      SLP SHORT TERM GOAL #2   Title pt will write one paragraph detailed information/mod complex topic with modified independence (compensations) in 2 sessions    Time 4    Period Weeks    Status New    Target Date 01/14/21      SLP SHORT TERM GOAL #3   Title pt will functionally verbally describe mod complex information (sequences, technical data, etc) in 3 sessions    Time 4    Period Weeks    Status New    Target Date 01/14/21      SLP SHORT TERM GOAL #4   Title SLP to more formally assess pt's written language skills    Time 2    Period Weeks    Status New    Target Date 01/04/21              SLP Long Term Goals - 12/21/20 1724       SLP LONG TERM GOAL #1   Title pt will functionally verbally describe complex information (sequences, technical data, job responsibilities, etc) in 3 sessions    Time 9    Period Weeks    Status New    Target Date 02/25/21      SLP LONG TERM GOAL #2   Title pt will functionally describe her successful utilization of memory strategies and/or attention compensations in 2 sessions    Time 9    Period Weeks    Status New    Target Date 02/25/21      SLP LONG TERM GOAL #3   Title pt will generate two paragraphs written information (work-like, mod complex/complex information) without errors, after self correction, in 3 sessions    Time 9    Period Weeks    Status New    Target Date 02/25/21      SLP LONG TERM  GOAL #4   Title pt will show independence with langauge enhancing treatments she can do at home (VNeST, SFA) in two sesssions    Time 9    Period Weeks    Status New    Target Date 02/25/21              Plan - 12/21/20 1713     Clinical Impression Statement Tiffany Horn presents today with mild expressive aphasia following multiple CVAs in the last 5-6 weeks- assessed as mildly deficient in word finding/anomia in verbal expression, and reported as impairments in sentence construction in written expression. Pt scored outside of the normal range with the [place]- 2nd Ed. Pt also endorses some deficits with memory and this affects her auditory comprehension  -example during evaluation today is that SLP asked pt to write a thank you note to someone who brought something to patietn at home after her d/c from hospital. Pt wrote the note in third person instead of TO the person who helped her. Tiffany Horn has begun writing down many items so that she will remember them. SLP believes pt will benefit from skilled ST targeting expressive language (spoken and written) and some compensatory strategies for memory.    Speech Therapy Frequency 2x / week  Duration --   9 weeks           Treatment/Interventions Patient/family education;Internal/external aids;Cognitive reorganization;Compensatory strategies;SLP instruction and feedback;Cueing hierarchy;Language facilitation;Functional tasks;Environmental controls      Potential to Achieve Goals Good     Consulted and Agree with Plan of Care Patient     Patient will benefit from skilled therapeutic intervention in order to improve the following deficits and impairments:   Expressive aphasia  Cognitive communication deficit    Problem List Patient Active Problem List   Diagnosis Date Noted   Acute CVA (cerebrovascular accident) ([place])    TIA (transient ischemic attack) 12/17/2020   Hypokalemia 12/17/2020   Moderate persistent asthma  12/17/2020   Hypertension    CVA (cerebral vascular accident) ([place]) 12/04/2020   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   Family history of heart disease 09/03/2019   Mild intermittent asthma 07/25/2019   Osteoporosis 06/12/2019   Elevated LFTs 06/06/2019   Hematuria 06/06/2019   Elevated IgE level 09/12/2017   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) 09/12/2017   Headache, migraine 02/15/2015   Vitamin D deficiency 10/19/2014   Encounter for preventive health examination 10/19/2014   Depression with anxiety 09/02/2014   Dyspnea on exertion 08/26/2014   Hyperlipidemia 08/18/2014   Hypothyroidism 08/18/2014   Obesity (BMI 30-39.9) 08/18/2014    Emori Kamau, [place] 12/21/2020, 5:40 PM  [facility] [address] [place], [place], [address] Phone: [phone]   Fax:  [phone]  Name: Tiffany Horn MRN: [id number] Date of Birth: 1960/02/07

## 2020-12-21 NOTE — Patient Instructions (Signed)
VNeST technique:     1) Choose a common verb from the list provided.   2) Think of 3 people (or "subjects") who could perform the action, or start by thinking of 3 objects the action could be done to. It might be easier to work on one complete set at a time, thinking of the object and person/subject together. The goal is to be as specific as possible with the nouns, so "farmer" would be a better subject than "man" for the verb drive. Write each subject and each object down on the paper in the appropriate column so you'll have 3 triads (subject-verb-object). It's okay to get personal. Family members, friends, and pets make great subjects for VNeST! Just try to vary the responses, so not all the nouns are personal or there is just one type of object. Similarly, try to use many different meanings of the verb if possible.   3) Read each triad of words aloud when you write them down on the lines below the chart. It's not important to conjugate the verb or add any articles to the nouns ("farmer drive tractor"), but it's okay if you do ("the farmer drives the tractor").   4) Select one of the three triads to expand upon. Ask "WHERE" it happens, "WHY" it happens, and "WHEN" it happens. Try to get as specific as possible, and write the responses down on the last line following the triad you chose to expand. Then, read the expanded sentence with the three answers to the questions. Again, the grammar doesn't matter as much as the focus is on connecting the concepts.

## 2020-12-27 ENCOUNTER — Ambulatory Visit: Payer: BC Managed Care – PPO | Admitting: Physical Therapy

## 2020-12-27 ENCOUNTER — Ambulatory Visit: Payer: BC Managed Care – PPO

## 2020-12-30 DIAGNOSIS — Z8616 Personal history of COVID-19: Secondary | ICD-10-CM

## 2020-12-30 HISTORY — DX: Personal history of COVID-19: Z86.16

## 2021-01-04 ENCOUNTER — Ambulatory Visit: Payer: BC Managed Care – PPO | Attending: Neurosurgery

## 2021-01-04 ENCOUNTER — Other Ambulatory Visit: Payer: Self-pay

## 2021-01-04 ENCOUNTER — Ambulatory Visit: Payer: BC Managed Care – PPO | Admitting: Rehabilitative and Restorative Service Providers"

## 2021-01-04 DIAGNOSIS — R2689 Other abnormalities of gait and mobility: Secondary | ICD-10-CM | POA: Diagnosis present

## 2021-01-04 DIAGNOSIS — R41841 Cognitive communication deficit: Secondary | ICD-10-CM | POA: Insufficient documentation

## 2021-01-04 DIAGNOSIS — R4701 Aphasia: Secondary | ICD-10-CM | POA: Diagnosis present

## 2021-01-04 DIAGNOSIS — M6281 Muscle weakness (generalized): Secondary | ICD-10-CM | POA: Insufficient documentation

## 2021-01-04 DIAGNOSIS — R42 Dizziness and giddiness: Secondary | ICD-10-CM

## 2021-01-04 DIAGNOSIS — R2681 Unsteadiness on feet: Secondary | ICD-10-CM | POA: Diagnosis present

## 2021-01-04 NOTE — Therapy (Signed)
[facility] [address], [place], [place], [address] Phone: [phone]   Fax:  [phone]  Physical Therapy Treatment  Patient Details  Name: Tiffany Horn MRN: [id number] Date of Birth: [birth year] Referring Provider (PT): Kathyrn Sheriff   Encounter Date: 01/04/2021   PT End of Session - 01/04/21 1458     Visit Number 2    Number of Visits 17    Date for PT Re-Evaluation 02/18/21    Authorization Type BCBS    PT Start Time 7001    PT Stop Time 1532    PT Time Calculation (min) 38 min    Activity Tolerance Patient tolerated treatment well;Patient limited by fatigue   rates dizziness symptoms as 7/10 with FGA; after quiet rest with eval explanation rates symptoms as 4/10            Past Medical History:  Diagnosis Date   Allergy    Anxiety    Arthritis    knees and spine, shoulder   Asthma    Bronchitis    hx - recurrent   Complication of anesthesia    waking up is not easy   Depression    Elevated IgE level 09/12/2017   09/12/2017 IgE 195   H/O miscarriage, not currently pregnant    Hx of irritable bowel syndrome    x2   Hyperlipidemia    diet controlled - no medication   Hypertension    not taking any meds at present - under control per patient   Hypokalemia    with PNA admission (2.5)   Ischemic cerebrovascular accident (CVA) ([place])    Migraines    Pneumonia    4 episodes; hosp. admission 2014   PONV (postoperative nausea and vomiting)    Thyroid disease    UC (ulcerative colitis) [facility])    DX'D 2021    Past Surgical History:  Procedure Laterality Date   BREAST SURGERY     implants, then had them removed   COLONOSCOPY     greater 10 yrs ago - ? [facility]-2017 LAST   DILATION AND CURETTAGE OF UTERUS     IR ANGIO INTRA EXTRACRAN SEL INTERNAL CAROTID BILAT MOD SED  11/19/2020   IR ANGIO VERTEBRAL SEL VERTEBRAL BILAT MOD SED  11/19/2020   IR ANGIOGRAM FOLLOW UP STUDY  11/19/2020   IR ANGIOGRAM  FOLLOW UP STUDY  11/19/2020   IR ANGIOGRAM FOLLOW UP STUDY  11/19/2020   IR ANGIOGRAM FOLLOW UP STUDY  11/19/2020   IR ANGIOGRAM FOLLOW UP STUDY  11/19/2020   IR ANGIOGRAM FOLLOW UP STUDY  11/19/2020   IR ANGIOGRAM FOLLOW UP STUDY  11/19/2020   IR ANGIOGRAM FOLLOW UP STUDY  11/19/2020   IR INTRA CRAN STENT  11/19/2020   IR TRANSCATH/EMBOLIZ  11/19/2020   LAPAROSCOPIC ABDOMINAL EXPLORATION  01/31/1992   endometriosis   ORIF HUMERUS FRACTURE Left 04/01/2013   DR Ninfa Linden - shoulder   ORIF HUMERUS FRACTURE Left 04/01/2013   Procedure: OPEN REDUCTION INTERNAL FIXATION (ORIF) LEFT PROXIMAL HUMERUS FRACTURE;  Surgeon: Mcarthur Rossetti, MD;  Location: [place];  Service: Orthopedics;  Laterality: Left;   RADIOLOGY WITH ANESTHESIA N/A 11/19/2020   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;  Surgeon: Consuella Lose, MD;  Location: [place];  Service: Radiology;  Laterality: N/A;   TONSILLECTOMY AND ADENOIDECTOMY      There were no vitals filed for this visit.   Subjective Assessment - 01/04/21 1456     Subjective  The patient notes "it's been a good week"  noting less blurriness, less stumbling and less HA.  She notes weakness has consistently been on the left.  She has a white line in her vision when lying on her L side to sleep.  This doesn't happen on the R side.  She works for 3-4 hours and then takes a break.  She continues with intermittent dizziness depending on what she does.    Patient Stated Goals Pt's goals for therapy are to build strength in L side, work on balance.    Currently in Pain? No/denies                [facility] PT Assessment - 01/04/21 1458       Assessment   Medical Diagnosis CVA    Referring Provider (PT) Kathyrn Sheriff    Onset Date/Surgical Date 11/19/20                 Vestibular Assessment - 01/04/21 1458       Oculomotor Exam   Oculomotor Alignment Normal    Ocular ROM WFLs    Spontaneous Absent    Gaze-induced  Absent    Smooth  Pursuits Intact   reports some blurriness   Saccades Intact    Comment h/o migraines with no visual aura, HA      Vestibulo-Ocular Reflex   VOR 1 Head Only (x 1 viewing) slow gaze x 1 viewing provokes blurry vision, no dizziness    VOR Cancellation Normal      Positional Sensitivities   Sit to Supine No dizziness    Rolling Right No dizziness    Rolling Left Moderate dizziness    Positional Sensitivities Comments "swimmy sensation" that settles in seconds                      [facility] Adult PT Treatment/Exercise - 01/04/21 1512       Ambulation/Gait   Ambulation/Gait Yes    Ambulation/Gait Assistance 6: Modified independent (Device/Increase time)    Ambulation Distance (Feet) 140 Feet    Assistive device None    Gait Comments dynamic gait with vertical and horizontal head motion provoking mild to moderate dizziness      Neuro Re-ed    Neuro Re-ed Details  single leg stance 10+ seconds R and L sides, turning 360 degrees R And L for habituation near support surface;  corner balance standing with eyes closed + feet together, partial tandem with head motion, unable to maintain tandem without UE support      Exercises   Exercises Other Exercises    Other Exercises  sidelying open book x 5 reps R and L sides             Vestibular Treatment/Exercise - 01/04/21 1506       Vestibular Treatment/Exercise   Vestibular Treatment Provided Habituation    Habituation Exercises Horizontal Roll      Horizontal Roll   Number of Reps  3    Symptom Description  symptoms reduce with repetition  (improves from 5/10 to 3/10 by 3rd rep)                    PT Education - 01/04/21 1525     Person(s) Educated Patient    Methods Explanation;Demonstration;Handout    Comprehension Verbalized understanding;Returned demonstration              PT Short Term Goals - 12/21/20 1533  PT SHORT TERM GOAL #1   Title Pt will be independent with HEP for improved  strength, balance, and gait.  TARGET 01/21/2021    Time 4    Period Weeks    Status New      PT SHORT TERM GOAL #2   Title Pt will rate dizziness/unsteadiness as less than or equal to 4/10 with functional gait activities.    Baseline 7/10 with FGA    Time 4    Period Weeks    Status New               PT Long Term Goals - 12/21/20 1534       PT LONG TERM GOAL #1   Title Pt will be independent with HEP for improved strength, balance, and gait.  TARGET 02/18/2021    Time 8    Period Weeks    Status New      PT LONG TERM GOAL #2   Title Pt will improve FGA to at least 25/30 for decreased fall risk.    Baseline 22/30    Time 8    Period Weeks    Status New      PT LONG TERM GOAL #3   Title Pt will rate dizziness/unsteadiness symtpoms as less than or equal to 2/10 with functional gait activiteis.    Time 8    Period Weeks    Status New      PT LONG TERM GOAL #4   Title Pt will verbalize understanding of fall prevention in home environment.    Time 8    Period Weeks    Status New                   Plan - 01/04/21 1538     Clinical Impression Statement The patient notes progress over the past week with less episodes of numbness/tingling.  She has WNLs oculomotor exam. She has motion sensitivity with rolling, turns, and multi-sensory balance challenges.  PT initiated HEP adding 3 activities and walking with emphasis on building endurance to tolerate daily activities.    PT Treatment/Interventions ADLs/Self Care Home Management;Gait training;Functional mobility training;Stair training;Therapeutic activities;Therapeutic exercise;Balance training;Neuromuscular re-education;Patient/family education;Vestibular    PT Next Visit Plan check HEP, endurance, dynamic gait and balance, habituation    Consulted and Agree with Plan of Care Patient             Patient will benefit from skilled therapeutic intervention in order to improve the following deficits and  impairments:     Visit Diagnosis: Unsteadiness on feet  Other abnormalities of gait and mobility  Dizziness and giddiness  Muscle weakness (generalized)     Problem List Patient Active Problem List   Diagnosis Date Noted   Acute CVA (cerebrovascular accident) ([place])    TIA (transient ischemic attack) 12/17/2020   Hypokalemia 12/17/2020   Moderate persistent asthma 12/17/2020   Hypertension    CVA (cerebral vascular accident) ([place]) 12/04/2020   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   Family history of heart disease 09/03/2019   Mild intermittent asthma 07/25/2019   Osteoporosis 06/12/2019   Elevated LFTs 06/06/2019   Hematuria 06/06/2019   Elevated IgE level 09/12/2017   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) 09/12/2017   Headache, migraine 02/15/2015   Vitamin D deficiency 10/19/2014   Encounter for preventive health examination 10/19/2014   Depression with anxiety 09/02/2014   Dyspnea on exertion 08/26/2014   Hyperlipidemia 08/18/2014   Hypothyroidism 08/18/2014   Obesity (BMI 30-39.9) 08/18/2014  [place], PT 01/04/2021, 3:40 PM  [facility] [address], [place], [place], [address] Phone: [phone]   Fax:  [phone]  Name: Tiffany Horn MRN: [id number] Date of Birth: 02-21-1960

## 2021-01-04 NOTE — Therapy (Signed)
[facility] [address], [place], [place], [address] Phone: [phone]   Fax:  [phone]  Speech Language Pathology Treatment  Patient Details  Name: Tiffany Horn MRN: [id number] Date of Birth: March 21, 1960 Referring Provider (SLP): Wendee Beavers MD (referring), Howard Pouch A NP (documentation)   Encounter Date: 01/04/2021   End of Session - 01/04/21 1632     Visit Number 2    Number of Visits 19    Date for SLP Re-Evaluation 02/25/21   added one week due to holiday   SLP Start Time 1535    SLP Stop Time  2831    SLP Time Calculation (min) 40 min    Activity Tolerance Patient tolerated treatment well             Past Medical History:  Diagnosis Date   Allergy    Anxiety    Arthritis    knees and spine, shoulder   Asthma    Bronchitis    hx - recurrent   Complication of anesthesia    waking up is not easy   Depression    Elevated IgE level 09/12/2017   09/12/2017 IgE 195   H/O miscarriage, not currently pregnant    Hx of irritable bowel syndrome    x2   Hyperlipidemia    diet controlled - no medication   Hypertension    not taking any meds at present - under control per patient   Hypokalemia    with PNA admission (2.5)   Ischemic cerebrovascular accident (CVA) ([place])    Migraines    Pneumonia    4 episodes; hosp. admission 2014   PONV (postoperative nausea and vomiting)    Thyroid disease    UC (ulcerative colitis) [facility])    DX'D 2021    Past Surgical History:  Procedure Laterality Date   BREAST SURGERY     implants, then had them removed   COLONOSCOPY     greater 10 yrs ago - ? [facility]-2017 LAST   DILATION AND CURETTAGE OF UTERUS     IR ANGIO INTRA EXTRACRAN SEL INTERNAL CAROTID BILAT MOD SED  11/19/2020   IR ANGIO VERTEBRAL SEL VERTEBRAL BILAT MOD SED  11/19/2020   IR ANGIOGRAM FOLLOW UP STUDY  11/19/2020   IR ANGIOGRAM FOLLOW UP STUDY  11/19/2020   IR ANGIOGRAM FOLLOW UP STUDY   11/19/2020   IR ANGIOGRAM FOLLOW UP STUDY  11/19/2020   IR ANGIOGRAM FOLLOW UP STUDY  11/19/2020   IR ANGIOGRAM FOLLOW UP STUDY  11/19/2020   IR ANGIOGRAM FOLLOW UP STUDY  11/19/2020   IR ANGIOGRAM FOLLOW UP STUDY  11/19/2020   IR INTRA CRAN STENT  11/19/2020   IR TRANSCATH/EMBOLIZ  11/19/2020   LAPAROSCOPIC ABDOMINAL EXPLORATION  01/31/1992   endometriosis   ORIF HUMERUS FRACTURE Left 04/01/2013   DR Ninfa Linden - shoulder   ORIF HUMERUS FRACTURE Left 04/01/2013   Procedure: OPEN REDUCTION INTERNAL FIXATION (ORIF) LEFT PROXIMAL HUMERUS FRACTURE;  Surgeon: Mcarthur Rossetti, MD;  Location: [place];  Service: Orthopedics;  Laterality: Left;   RADIOLOGY WITH ANESTHESIA N/A 11/19/2020   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;  Surgeon: Consuella Lose, MD;  Location: [place];  Service: Radiology;  Laterality: N/A;   TONSILLECTOMY AND ADENOIDECTOMY      There were no vitals filed for this visit.   Subjective Assessment - 01/04/21 1550     Subjective Pt asked SLP about emotional changes after CVA. SLP provided handout  from Stroke Association in [place] and [place] Stroke Association.    Currently in Pain? 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 SLP and pt spoke about some compensations she could make for written language for tutor summaries/notes which are req'd for each tutoring session. Pt will incorporate these into her daily routine. She also  told SLP she consistently feels overtired when she returns home from a day's work. SLP suggested, educated pt about, and problem solved with pt when she could take "brain breaks" during her day. She will begin to incorporate these starting tomorrow. "I'm not really doing that at all," pt stated.      Assessment / Recommendations / Plan   Plan Continue with current plan of care      Progression Toward Goals   Progression toward goals Progressing toward goals              SLP Education - 01/04/21 1632     Education Details compensatory measures (brain breaks), emotion changes and CVA    Person(s) Educated Patient    Methods Explanation;Handout    Comprehension Verbalized understanding              SLP Short Term Goals - 01/04/21 1633       SLP SHORT TERM GOAL #1   Title pt will tell SLP 4 practical compensatory measures she could use for memory in 2 sessions    Time 4    Period Weeks    Status On-going    Target Date 01/14/21      SLP SHORT TERM GOAL #2   Title pt will write one paragraph detailed information/mod complex topic with modified independence (compensations) in 2 sessions    Time 4    Period Weeks    Status On-going    Target Date 01/14/21      SLP SHORT TERM GOAL #3   Title pt will functionally verbally describe mod complex information (sequences, technical data, etc) in 3 sessions    Time 4    Period Weeks    Status On-going    Target Date 01/14/21      SLP SHORT TERM GOAL #4   Title SLP to more formally assess pt's written language skills    Time 2    Period Weeks    Status On-going    Target Date 01/04/21              SLP Long Term Goals - 01/04/21 1633       SLP LONG TERM GOAL #1   Title pt will functionally verbally describe complex information (sequences, technical data, job responsibilities, etc) in 3 sessions    Time 9    Period Weeks    Status New    Target Date 02/25/21      SLP LONG TERM GOAL #2   Title pt will  functionally describe her successful utilization of memory strategies  and/or attention compensations in 2 sessions    Time 9    Period Weeks    Status New    Target Date 02/25/21      SLP LONG TERM GOAL #3   Title pt will generate two paragraphs written information (work-like, mod complex/complex information) without errors, after self correction, in 3 sessions    Time 9    Period Weeks    Status New    Target Date 02/25/21      SLP LONG TERM GOAL #4   Title pt will show independence with langauge enhancing treatments she can do at home (VNeST, SFA) in two sesssions    Time 9    Period Weeks    Status New    Target Date 02/25/21              Plan - 01/04/21 1633     Clinical Impression Statement Tiffany Horn presents today with mild expressive aphasia following multiple CVAs in the last 5-6 weeks- assessed as mildly deficient in word finding/anomia in verbal expression, and reported as impairments in sentence construction in written expression. Pt scored outside of the normal range with the [place]- 2nd Ed. Pt also endorses some deficits with memory and this affects her auditory comprehension  -example during evaluation today is that SLP asked pt to write a thank you note to someone who brought something to patietn at home after her d/c from hospital. Pt wrote the note in third person instead of TO the person who helped her. Tiffany Horn has begun writing down many items so that she will remember them. SLP believes pt will benefit from skilled ST targeting expressive language (spoken and written) and some compensatory strategies for memory.    Speech Therapy Frequency 2x / week    Duration --   9 weeks   Treatment/Interventions Patient/family education;Internal/external aids;Cognitive reorganization;Compensatory strategies;SLP instruction and feedback;Cueing hierarchy;Language facilitation;Functional tasks;Environmental controls    Potential to Achieve Goals Good    Consulted  and Agree with Plan of Care Patient             Patient will benefit from skilled therapeutic intervention in order to improve the following deficits and impairments:   Expressive aphasia  Cognitive communication deficit    Problem List Patient Active Problem List   Diagnosis Date Noted   Acute CVA (cerebrovascular accident) ([place])    TIA (transient ischemic attack) 12/17/2020   Hypokalemia 12/17/2020   Moderate persistent asthma 12/17/2020   Hypertension    CVA (cerebral vascular accident) ([place]) 12/04/2020   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   Family history of heart disease 09/03/2019   Mild intermittent asthma 07/25/2019   Osteoporosis 06/12/2019   Elevated LFTs 06/06/2019   Hematuria 06/06/2019   Elevated IgE level 09/12/2017   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) 09/12/2017   Headache, migraine 02/15/2015   Vitamin D deficiency 10/19/2014   Encounter for preventive health examination 10/19/2014   Depression with anxiety 09/02/2014   Dyspnea on exertion 08/26/2014   Hyperlipidemia 08/18/2014   Hypothyroidism 08/18/2014   Obesity (BMI 30-39.9) 08/18/2014    [place], [place] 01/04/2021, 4:35 PM  [facility] [address], [place], [place], [address] Phone: [phone]   Fax:  [phone]   Name: Tiffany Horn MRN: [id number] Date of Birth: 12/08/1960

## 2021-01-04 NOTE — Patient Instructions (Signed)
Access Code: [email or web address] URL: [email or web address] Date: 01/04/2021 Prepared by: Rudell Cobb  Program Notes Walking:  Continue short, frequent walks during the day 10+ minutes to build endurance.    Exercises Romberg Stance with Eyes Closed - 2 x daily - 7 x weekly - 1 sets - 3 reps - 20-30 seconds hold Rolling right<>left sides for vestibular habituation - 2 x daily - 7 x weekly - 1 sets - 5 reps Sidelying Thoracic Rotation with Open Book - 1 x daily - 7 x weekly - 1 sets - 3-5 reps

## 2021-01-04 NOTE — Patient Instructions (Signed)
Handouts regarding emotional changes after CVA  [email or web address]

## 2021-01-05 NOTE — Progress Notes (Deleted)
GYNECOLOGY  VISIT   HPI: 60 y.o.   Divorced  Caucasian  female   [id number] with Patient's last menstrual period was 01/31/2007.   here for PMB?  GYNECOLOGIC HISTORY: Patient's last menstrual period was 01/31/2007. Contraception:  n/a Menopausal hormone therapy:  n/a Last mammogram:  07/08/20- [facility] 1 neg  Last pap smear: 02/20/18- WNL, HPV- neg        OB History     Gravida  2   Para  0   Term  0   Preterm  0   AB  2   Living  0      SAB  2   IAB      Ectopic      Multiple      Live Births                 Patient Active Problem List   Diagnosis Date Noted   Acute CVA (cerebrovascular accident) ([place])    TIA (transient ischemic attack) 12/17/2020   Hypokalemia 12/17/2020   Moderate persistent asthma 12/17/2020   Hypertension    CVA (cerebral vascular accident) ([place]) 12/04/2020   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   Family history of heart disease 09/03/2019   Mild intermittent asthma 07/25/2019   Osteoporosis 06/12/2019   Elevated LFTs 06/06/2019   Hematuria 06/06/2019   Elevated IgE level 09/12/2017   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) 09/12/2017   Headache, migraine 02/15/2015   Vitamin D deficiency 10/19/2014   Encounter for preventive health examination 10/19/2014   Depression with anxiety 09/02/2014   Dyspnea on exertion 08/26/2014   Hyperlipidemia 08/18/2014   Hypothyroidism 08/18/2014   Obesity (BMI 30-39.9) 08/18/2014    Past Medical History:  Diagnosis Date   Allergy    Anxiety    Arthritis    knees and spine, shoulder   Asthma    Bronchitis    hx - recurrent   Complication of anesthesia    waking up is not easy   Depression    Elevated IgE level 09/12/2017   09/12/2017 IgE 195   H/O miscarriage, not currently pregnant    Hx of irritable bowel syndrome    x2   Hyperlipidemia    diet controlled - no medication   Hypertension    not taking any meds at present - under control per patient   Hypokalemia     with PNA admission (2.5)   Ischemic cerebrovascular accident (CVA) ([place])    Migraines    Pneumonia    4 episodes; hosp. admission 2014   PONV (postoperative nausea and vomiting)    Thyroid disease    UC (ulcerative colitis) [facility])    DX'D 2021    Past Surgical History:  Procedure Laterality Date   BREAST SURGERY     implants, then had them removed   COLONOSCOPY     greater 10 yrs ago - ? [facility]-2017 LAST   DILATION AND CURETTAGE OF UTERUS     IR ANGIO INTRA EXTRACRAN SEL INTERNAL CAROTID BILAT MOD SED  11/19/2020   IR ANGIO VERTEBRAL SEL VERTEBRAL BILAT MOD SED  11/19/2020   IR ANGIOGRAM FOLLOW UP STUDY  11/19/2020   IR ANGIOGRAM FOLLOW UP STUDY  11/19/2020   IR ANGIOGRAM FOLLOW UP STUDY  11/19/2020   IR ANGIOGRAM FOLLOW UP STUDY  11/19/2020   IR ANGIOGRAM FOLLOW UP STUDY  11/19/2020   IR ANGIOGRAM FOLLOW UP STUDY  11/19/2020   IR ANGIOGRAM FOLLOW UP STUDY  11/19/2020  IR ANGIOGRAM FOLLOW UP STUDY  11/19/2020   IR INTRA CRAN STENT  11/19/2020   IR TRANSCATH/EMBOLIZ  11/19/2020   LAPAROSCOPIC ABDOMINAL EXPLORATION  01/31/1992   endometriosis   ORIF HUMERUS FRACTURE Left 04/01/2013   DR Ninfa Linden - shoulder   ORIF HUMERUS FRACTURE Left 04/01/2013   Procedure: OPEN REDUCTION INTERNAL FIXATION (ORIF) LEFT PROXIMAL HUMERUS FRACTURE;  Surgeon: Mcarthur Rossetti, MD;  Location: [place];  Service: Orthopedics;  Laterality: Left;   RADIOLOGY WITH ANESTHESIA N/A 11/19/2020   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;  Surgeon: Consuella Lose, MD;  Location: [place];  Service: Radiology;  Laterality: N/A;   TONSILLECTOMY AND ADENOIDECTOMY      Current Outpatient Medications  Medication Sig Dispense Refill   amLODipine (NORVASC) 10 MG tablet Take 1 tablet (10 mg total) by mouth daily. 90 tablet 1   Ascorbic Acid (VITAMIN C) 1000 MG tablet Take 1,000 mg by mouth daily.     aspirin 81 MG chewable tablet Chew 1 tablet (81 mg total) by mouth daily. 30 tablet 3    atorvastatin (LIPITOR) 80 MG tablet Take 1 tablet (80 mg total) by mouth daily. 90 tablet 1   B Complex Vitamins (B COMPLEX PO) Take 1 tablet by mouth daily.     Budeson-Glycopyrrol-Formoterol (BREZTRI AEROSPHERE) 160-9-4.8 MCG/ACT AERO Inhale 2 puffs into the lungs in the morning and at bedtime. 5.9 g 1   clonazePAM (KLONOPIN) 0.5 MG tablet Take 1 tablet (0.5 mg total) by mouth 3 (three) times daily as needed for anxiety. (Patient taking differently: Take 0.5 mg by mouth daily as needed for anxiety.) 90 tablet 5   ezetimibe (ZETIA) 10 MG tablet Take 1 tablet (10 mg total) by mouth daily. 30 tablet 1   fluticasone furoate-vilanterol (BREO ELLIPTA) 100-25 MCG/ACT AEPB Inhale 1 puff into the lungs daily as needed.     levothyroxine (SYNTHROID) 112 MCG tablet Take 1 tablet (112 mcg total) by mouth daily. (Patient taking differently: Take 112 mcg by mouth at bedtime.) 30 tablet 0   MAGNESIUM GLYCINATE PO Take 1 tablet by mouth at bedtime.     PARoxetine (PAXIL) 40 MG tablet Take 1 tablet (40 mg total) by mouth daily. 90 tablet 1   rizatriptan (MAXALT) 5 MG tablet Take 1 tablet (5 mg total) by mouth daily as needed for migraine. 10 tablet 5   Spacer/Aero-Holding Chambers DEVI Use daily with inhaler 1 each 0   ticagrelor (BRILINTA) 90 MG TABS tablet Take 1 tablet (90 mg total) by mouth 2 (two) times daily. 60 tablet 3   umeclidinium bromide (INCRUSE ELLIPTA) 62.5 MCG/ACT AEPB Inhale 1 puff into the lungs daily.     Current Facility-Administered Medications  Medication Dose Route Frequency Provider Last Rate Last Admin   0.9 %  sodium chloride infusion  500 mL Intravenous Once Nandigam, Venia Minks, MD         ALLERGIES: Abilify [aripiprazole], Amoxicillin, and Seroquel [quetiapine fumarate]  Family History  Problem Relation Age of Onset   Breast cancer Mother    Diabetes Mother    Heart disease Mother    Diabetes Father    Dementia Father    Colon cancer Father    Stroke Paternal Grandmother     Diabetes Paternal Grandmother    Colon polyps Neg Hx    Esophageal cancer Neg Hx    Stomach cancer Neg Hx    Rectal cancer Neg Hx     Social History   Socioeconomic History  Marital status: Divorced    Spouse name: Not on file   Number of children: 0   Years of education: Not on file   Highest education level: Not on file  Occupational History   Occupation: Control and instrumentation engineer  Tobacco Use   Smoking status: Never   Smokeless tobacco: Never  Vaping Use   Vaping Use: Never used  Substance and Sexual Activity   Alcohol use: Yes    Alcohol/week: 2.0 standard drinks    Types: 2 Glasses of wine per week    Comment: OCC / SOCIAL  MAYBE 2 A WEEK   Drug use: No   Sexual activity: Not Currently    Partners: Male    Birth control/protection: Post-menopausal  Other Topics Concern   Not on file  Social History Narrative   She is originally from [place]. She has traveled to [facility], [place], [place], [place], [place], [place], [place], [place], [place]. No international travel. She has dogs. No prior bird, mold, or recent hot tub exposure. She hasn't used her hot tub in 1.5 years. 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 Previously enjoyed gardening and playing tennis. Helps to care for her mother.   Social Determinants of Health   Financial Resource Strain: Not on file  Food Insecurity: Not on file  Transportation Needs: Not on file  Physical Activity: Not on file  Stress: Not on file  Social Connections: Not on file  Intimate Partner Violence: Not on file    Review of Systems  PHYSICAL EXAMINATION:    LMP 01/31/2007     General appearance: alert, cooperative and appears stated age Head: Normocephalic, without obvious abnormality, atraumatic Neck: no adenopathy, supple, symmetrical, trachea midline and thyroid normal to inspection and palpation Lungs: clear to auscultation bilaterally Breasts: normal appearance, no masses or tenderness, No nipple  retraction or dimpling, No nipple discharge or bleeding, No axillary or supraclavicular adenopathy Heart: regular rate and rhythm Abdomen: soft, non-tender, no masses,  no organomegaly Extremities: extremities normal, atraumatic, no cyanosis or edema Skin: Skin color, texture, turgor normal. No rashes or lesions Lymph nodes: Cervical, supraclavicular, and axillary nodes normal. No abnormal inguinal nodes palpated Neurologic: Grossly normal  Pelvic: External genitalia:  no lesions              Urethra:  normal appearing urethra with no masses, tenderness or lesions              Bartholins and Skenes: normal                 Vagina: normal appearing vagina with normal color and discharge, no lesions              Cervix: no lesions                Bimanual Exam:  Uterus:  normal size, contour, position, consistency, mobility, non-tender              Adnexa: no mass, fullness, tenderness              Rectal exam: {yes no:314532}.  Confirms.              Anus:  normal sphincter tone, no lesions  Chaperone was present for exam:  ***  ASSESSMENT     PLAN     An After Visit Summary was printed and given to the patient.  ______ minutes face to face time of which over 50% was  spent in counseling.

## 2021-01-06 ENCOUNTER — Ambulatory Visit: Payer: BC Managed Care – PPO | Admitting: Obstetrics and Gynecology

## 2021-01-10 ENCOUNTER — Other Ambulatory Visit: Payer: Self-pay

## 2021-01-10 ENCOUNTER — Ambulatory Visit: Payer: BC Managed Care – PPO | Admitting: Physical Therapy

## 2021-01-10 ENCOUNTER — Emergency Department (HOSPITAL_COMMUNITY): Payer: BC Managed Care – PPO

## 2021-01-10 ENCOUNTER — Emergency Department (HOSPITAL_COMMUNITY)
Admission: EM | Admit: 2021-01-10 | Discharge: 2021-01-10 | Disposition: A | Discharge status: Home / Self Care | Payer: BC Managed Care – PPO | Attending: Emergency Medicine | Admitting: Emergency Medicine

## 2021-01-10 ENCOUNTER — Encounter (HOSPITAL_COMMUNITY): Payer: Self-pay

## 2021-01-10 ENCOUNTER — Ambulatory Visit: Payer: BC Managed Care – PPO | Admitting: Neurology

## 2021-01-10 ENCOUNTER — Encounter: Payer: Self-pay | Admitting: Neurology

## 2021-01-10 VITALS — BP 134/88 | HR 72 | Ht 66.0 in | Wt 177.0 lb

## 2021-01-10 DIAGNOSIS — Z20822 Contact with and (suspected) exposure to covid-19: Secondary | ICD-10-CM | POA: Diagnosis not present

## 2021-01-10 DIAGNOSIS — Z7982 Long term (current) use of aspirin: Secondary | ICD-10-CM | POA: Insufficient documentation

## 2021-01-10 DIAGNOSIS — R42 Dizziness and giddiness: Secondary | ICD-10-CM | POA: Diagnosis not present

## 2021-01-10 DIAGNOSIS — N9489 Other specified conditions associated with female genital organs and menstrual cycle: Secondary | ICD-10-CM | POA: Insufficient documentation

## 2021-01-10 DIAGNOSIS — R519 Headache, unspecified: Secondary | ICD-10-CM

## 2021-01-10 DIAGNOSIS — Z79899 Other long term (current) drug therapy: Secondary | ICD-10-CM | POA: Diagnosis not present

## 2021-01-10 DIAGNOSIS — R2990 Unspecified symptoms and signs involving the nervous system: Secondary | ICD-10-CM | POA: Diagnosis not present

## 2021-01-10 DIAGNOSIS — E039 Hypothyroidism, unspecified: Secondary | ICD-10-CM | POA: Insufficient documentation

## 2021-01-10 DIAGNOSIS — Z7951 Long term (current) use of inhaled steroids: Secondary | ICD-10-CM | POA: Diagnosis not present

## 2021-01-10 DIAGNOSIS — R531 Weakness: Secondary | ICD-10-CM | POA: Diagnosis present

## 2021-01-10 DIAGNOSIS — J45909 Unspecified asthma, uncomplicated: Secondary | ICD-10-CM | POA: Diagnosis not present

## 2021-01-10 DIAGNOSIS — I10 Essential (primary) hypertension: Secondary | ICD-10-CM | POA: Insufficient documentation

## 2021-01-10 DIAGNOSIS — R2689 Other abnormalities of gait and mobility: Secondary | ICD-10-CM

## 2021-01-10 LAB — COMPREHENSIVE METABOLIC PANEL
ALT: 20 U/L (ref 0–44)
AST: 19 U/L (ref 15–41)
Albumin: 4.1 g/dL (ref 3.5–5.0)
Alkaline Phosphatase: 63 U/L (ref 38–126)
Anion gap: 9 (ref 5–15)
BUN: 10 mg/dL (ref 6–20)
CO2: 25 mmol/L (ref 22–32)
Calcium: 9.1 mg/dL (ref 8.9–10.3)
Chloride: 107 mmol/L (ref 98–111)
Creatinine, Ser: 0.74 mg/dL (ref 0.44–1.00)
GFR, Estimated: >60 mL/min (ref >60)
Glucose, Bld: 119 mg/dL — ABNORMAL HIGH (ref 70–99)
Potassium: 3.8 mmol/L (ref 3.5–5.1)
Sodium: 141 mmol/L (ref 135–145)
Total Bilirubin: 0.7 mg/dL (ref 0.3–1.2)
Total Protein: 7.2 g/dL (ref 6.5–8.1)

## 2021-01-10 LAB — URINALYSIS, ROUTINE W REFLEX MICROSCOPIC
Bilirubin Urine: NEGATIVE
Glucose, UA: NEGATIVE mg/dL
Ketones, ur: NEGATIVE mg/dL
Nitrite: NEGATIVE
Protein, ur: NEGATIVE mg/dL
Specific Gravity, Urine: 1.01 (ref 1.005–1.030)
pH: 5.5 (ref 5.0–8.0)

## 2021-01-10 LAB — DIFFERENTIAL
Abs Immature Granulocytes: 0.02 10*3/uL (ref 0.00–0.07)
Basophils Absolute: 0.1 10*3/uL (ref 0.0–0.1)
Basophils Relative: 1 %
Eosinophils Absolute: 0.3 10*3/uL (ref 0.0–0.5)
Eosinophils Relative: 5 %
Immature Granulocytes: 0 %
Lymphocytes Relative: 38 %
Lymphs Abs: 2.2 10*3/uL (ref 0.7–4.0)
Monocytes Absolute: 0.4 10*3/uL (ref 0.1–1.0)
Monocytes Relative: 7 %
Neutro Abs: 2.9 10*3/uL (ref 1.7–7.7)
Neutrophils Relative %: 49 %

## 2021-01-10 LAB — ETHANOL: Alcohol, Ethyl (B): <10 mg/dL (ref <10)

## 2021-01-10 LAB — RAPID URINE DRUG SCREEN, HOSP PERFORMED
Amphetamines: NOT DETECTED
Barbiturates: NOT DETECTED
Benzodiazepines: NOT DETECTED
Cocaine: NOT DETECTED
Opiates: NOT DETECTED
Tetrahydrocannabinol: NOT DETECTED

## 2021-01-10 LAB — ANTITHROMBIN III: AntiThromb III Func: 106 % (ref 75–120)

## 2021-01-10 LAB — CBG MONITORING, ED: Glucose-Capillary: 95 mg/dL (ref 70–99)

## 2021-01-10 LAB — CBC
HCT: 45.7 % (ref 36.0–46.0)
Hemoglobin: 14.9 g/dL (ref 12.0–15.0)
MCH: 30 pg (ref 26.0–34.0)
MCHC: 32.6 g/dL (ref 30.0–36.0)
MCV: 92.1 fL (ref 80.0–100.0)
Platelets: 241 10*3/uL (ref 150–400)
RBC: 4.96 MIL/uL (ref 3.87–5.11)
RDW: 12.8 % (ref 11.5–15.5)
WBC: 5.9 10*3/uL (ref 4.0–10.5)
nRBC: 0 % (ref 0.0–0.2)

## 2021-01-10 LAB — I-STAT CHEM 8, ED
BUN: 12 mg/dL (ref 6–20)
Calcium, Ion: 1.17 mmol/L (ref 1.15–1.40)
Chloride: 105 mmol/L (ref 98–111)
Creatinine, Ser: 0.7 mg/dL (ref 0.44–1.00)
Glucose, Bld: 107 mg/dL — ABNORMAL HIGH (ref 70–99)
HCT: 44 % (ref 36.0–46.0)
Hemoglobin: 15 g/dL (ref 12.0–15.0)
Potassium: 3.6 mmol/L (ref 3.5–5.1)
Sodium: 141 mmol/L (ref 135–145)
TCO2: 25 mmol/L (ref 22–32)

## 2021-01-10 LAB — URINALYSIS, MICROSCOPIC (REFLEX): Bacteria, UA: NONE SEEN

## 2021-01-10 LAB — I-STAT BETA HCG BLOOD, ED (MC, WL, AP ONLY): I-stat hCG, quantitative: <5 m[IU]/mL (ref <5)

## 2021-01-10 LAB — PROTIME-INR
INR: 0.9 (ref 0.8–1.2)
Prothrombin Time: 12.1 seconds (ref 11.4–15.2)

## 2021-01-10 LAB — RESP PANEL BY RT-PCR (FLU A&B, COVID) ARPGX2
Influenza A by PCR: NEGATIVE
Influenza B by PCR: NEGATIVE
SARS Coronavirus 2 by RT PCR: NEGATIVE

## 2021-01-10 LAB — APTT: aPTT: 26 seconds (ref 24–36)

## 2021-01-10 MED ORDER — LEVETIRACETAM 500 MG PO TABS: 500.0000 mg | ORAL_TABLET | Freq: Two times a day (BID) | ORAL | 1 refills | Status: DC

## 2021-01-10 MED ORDER — IOHEXOL 350 MG/ML SOLN
75.0000 mL | Freq: Once | INTRAVENOUS | Status: AC | PRN
  Administered 2021-01-10: 75 mL via INTRAVENOUS

## 2021-01-10 MED ORDER — LEVETIRACETAM IN NACL 1000 MG/100ML IV SOLN
1000.0000 mg | INTRAVENOUS | Status: AC
  Administered 2021-01-10: 1000 mg via INTRAVENOUS
  Filled 2021-01-10: qty 100

## 2021-01-10 NOTE — ED Triage Notes (Signed)
Pt. Stated, Ive had some nausea.

## 2021-01-10 NOTE — Code Documentation (Addendum)
Stroke Response Nurse Documentation Code Documentation  Tiffany Horn is a 60 y.o. female arriving to [name] ED via Private Vehicle on 01/10/21 with past medical hx of aneurysm coiling, CVA, HLD, HTN, Migraines. On aspirin 81 mg daily and Brilinta (ticagrelor) 90 mg bid. Code stroke was activated by triage.   Patient from home where she was LKW at 2200 01/09/21 and now complaining of headache, nausea, dizziness, blurred vision, left sided weakness. Patient went to bed at 2200 normal and woke up with pain in the back of her skull that felt "like someone was stabbing me with a knife," nausea, and blurred vision. She got up to go the bathroom and felt herself leaning to the left and was dizzy. Pt went to her neurosurgeon's office and they referred her to [facility]. She was seen by Dr. Leonie Man and he told her to go to the hospital per patient.   Pt arrived to [facility] by private vehicle and code stroke was activated in triage. Neurologist and SRN were contacted by CT tech while pt was on the table as code stroke page had not come through yet. NIHSS 1, see documentation for details and code stroke times. Patient with left arm weakness on exam. The following imaging was completed: CT, CTA. Patient is not a candidate for IV Thrombolytic due to outside window. Patient is not a candidate for IR due to no LVO.   Care/Plan: Q2x12 then Q4 vitals/mNIHSS, routine MRI.    Bedside handoff with ED RN [place].    Delilah Mulgrew, Rande Brunt  Stroke Response RN

## 2021-01-10 NOTE — ED Notes (Signed)
Patient transported to MRI 

## 2021-01-10 NOTE — ED Notes (Signed)
Notified charge nurse at 1055- CODE STROKE

## 2021-01-10 NOTE — Consult Note (Addendum)
Neurology Consultation  Reason for Consult: code stroke - left sided numbness Referring Physician: Dr Roderic Palau  CC:   History is obtained from:Patient, chart  HPI: Tiffany Horn is a 60 y.o. 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  She had similar symptoms when she had strokes in November and that is what worried her and brought her to the emergency room. Her last known well was when she went to bed at 10 PM yesterday and not 7 AM when she woke up because she woke up with the symptoms. Code stroke page has missed documented last known well at 7 AM today. She is outside the window for IV thrombolysis. Reports persistent symptoms of dizziness, subjective left-sided numbness and dizziness which she describes as lightheadedness at the time of this evaluation.  Records later reviewed reveal that she visited her neurologist Dr. Leonie Man this morning and informed him of the symptoms, and he recommended that she come to the emergency room for a nonemergent, outside the window for treatment, evaluation.  Extensive work-up including transcranial Dopplers, 2D echo, CT angiography's-have been unrevealing for cause of the stroke.  Reports personal stressors somewhat more than normal at this time.   LKW: 10 PM on 01/09/2021 tpa given?: no, outside the window Premorbid modified Rankin scale (mRS): 1  ROS: Full ROS was performed and is negative except as noted in the HPI.  Past Medical History:  Diagnosis Date   Allergy    Anxiety    Arthritis    knees and spine, shoulder    Asthma    Bronchitis    hx - recurrent   Complication of anesthesia    waking up is not easy   Depression    Elevated IgE level 09/12/2017   09/12/2017 IgE 195   H/O miscarriage, not currently pregnant    Hx of irritable bowel syndrome    x2   Hyperlipidemia    diet controlled - no medication   Hypertension    not taking any meds at present - under control per patient   Hypokalemia    with PNA admission (2.5)   Ischemic cerebrovascular accident (CVA) ([place])    Migraines    Pneumonia    4 episodes; hosp. admission 2014   PONV (postoperative nausea and vomiting)    Thyroid disease    UC (ulcerative colitis) ([place])    DX'D 2021    Family History  Problem Relation Age of Onset   Breast cancer Mother    Diabetes Mother    Heart disease Mother    Diabetes Father    Dementia Father    Colon cancer Father    Stroke Paternal Grandmother    Diabetes Paternal Grandmother    Colon polyps Neg Hx    Esophageal cancer Neg Hx    Stomach cancer Neg Hx    Rectal cancer Neg Hx    Social History:   reports that she has never smoked. She has never used smokeless tobacco. She reports current alcohol use of about 2.0 standard drinks per week. She reports that she does not use drugs.  Medications  Current Facility-Administered Medications:    0.9 %  sodium chloride infusion, 500 mL, Intravenous, Once, Nandigam, Kavitha V, MD  Current Outpatient Medications:    amLODipine (NORVASC) 10 MG tablet, Take 1 tablet (10 mg total) by mouth daily., Disp: 90 tablet, Rfl: 1   Ascorbic Acid (VITAMIN C) 1000 MG tablet, Take 1,000 mg by mouth daily., Disp: , Rfl:    aspirin 81 MG chewable tablet, Chew 1 tablet (81 mg total) by mouth daily., Disp: 30 tablet, Rfl: 3   atorvastatin (LIPITOR) 80 MG tablet, Take 1 tablet (80 mg total) by mouth daily., Disp: 90 tablet, Rfl: 1   B Complex Vitamins (B COMPLEX PO), Take 1 tablet by mouth daily., Disp: , Rfl:    Budeson-Glycopyrrol-Formoterol (BREZTRI  AEROSPHERE) 160-9-4.8 MCG/ACT AERO, Inhale 2 puffs into the lungs in the morning and at bedtime., Disp: 5.9 g, Rfl: 1   clonazePAM (KLONOPIN) 0.5 MG tablet, Take 1 tablet (0.5 mg total) by mouth 3 (three) times daily as needed for anxiety. (Patient not taking: Reported on 01/10/2021), Disp: 90 tablet, Rfl: 5   ezetimibe (ZETIA) 10 MG tablet, Take 1 tablet (10 mg total) by mouth daily., Disp: 30 tablet, Rfl: 1   fluticasone furoate-vilanterol (BREO ELLIPTA) 100-25 MCG/ACT AEPB, Inhale 1 puff into the lungs daily as needed., Disp: , Rfl:    levothyroxine (SYNTHROID) 112 MCG tablet, Take 1 tablet (112 mcg total) by mouth daily. (Patient taking differently: Take 112 mcg by mouth at bedtime.), Disp: 30 tablet, Rfl: 0   LORazepam (ATIVAN) 0.5 MG tablet, Take 0.5 mg by mouth daily as needed., Disp: , Rfl:    MAGNESIUM GLYCINATE PO, Take 1 tablet by mouth at bedtime., Disp: , Rfl:    ondansetron (ZOFRAN) 4 MG tablet, Take 1 tablet by mouth as needed., Disp: , Rfl:    PARoxetine (PAXIL) 40 MG tablet, Take 1 tablet (40 mg total) by mouth daily., Disp: 90 tablet, Rfl: 1   rizatriptan (MAXALT) 5 MG tablet, Take 1 tablet (5 mg total) by mouth daily as needed for migraine., Disp: 10 tablet, Rfl: 5   Spacer/Aero-Holding Chambers DEVI, Use daily with inhaler, Disp: 1 each, Rfl: 0   ticagrelor (BRILINTA) 90 MG TABS tablet, Take 1 tablet (90 mg total) by mouth 2 (two) times daily., Disp: 60 tablet, Rfl: 3   umeclidinium bromide (INCRUSE ELLIPTA) 62.5 MCG/ACT AEPB, Inhale 1 puff into the lungs daily., Disp: , Rfl:   Exam: Current vital signs: BP 140/83   Pulse 64   Temp 98.4 F (36.9 C)   Resp 12   LMP 01/31/2007   SpO2 100%  Vital signs in last 24 hours: Temp:  [98.4 F (36.9 C)] 98.4 F (36.9 C) (12/12 1115) Pulse Rate:  [64-72] 64 (12/12 1115) Resp:  [12] 12 (12/12 1115) BP: (134-140)/(83-88) 140/83 (12/12 1115) SpO2:  [100 %] 100 % (12/12 1115) Weight:  [80.3 kg] 80.3 kg (12/12 0958)  GENERAL:  Awake, alert in NAD HEENT: - Normocephalic and atraumatic, dry mm, no LN++, no Thyromegally LUNGS - Clear to auscultation bilaterally with no wheezes CV - S1S2 RRR, no m/r/g, equal pulses bilaterally. ABDOMEN - Soft, nontender, nondistended with normoactive BS Ext: warm, well perfused, intact peripheral pulses, no edema  NEURO:  Mental Status: AA&Ox3  Language: speech is nondysarthric.  Naming, repetition, fluency, and comprehension intact. Cranial Nerves: PERRL. EOMI-reports blurred vision, visual fields full, no facial asymmetry, facial sensation intact, hearing intact, tongue/uvula/soft palate midline, normal sternocleidomastoid and trapezius muscle strength. No evidence of tongue atrophy or fibrillations Motor: Subtle vertical drift in  the left upper extremity, otherwise 5/5 without vertical drift. Tone: is normal and bulk is normal Sensation- Intact to light touch bilaterally Coordination: FTN intact bilaterally, no ataxia in BLE. Gait- deferred  NIHSS-1 for subtle left arm drift   Labs I have reviewed labs in epic and the results pertinent to this consultation are:   CBC    Component Value Date/Time   WBC 5.9 01/10/2021 1107   RBC 4.96 01/10/2021 1107   HGB 15.0 01/10/2021 1119   HCT 44.0 01/10/2021 1119   PLT 241 01/10/2021 1107   MCV 92.1 01/10/2021 1107   MCV 91.9 03/14/2013 1042   MCH 30.0 01/10/2021 1107   MCHC 32.6 01/10/2021 1107   RDW 12.8 01/10/2021 1107   LYMPHSABS 2.2 01/10/2021 1107   MONOABS 0.4 01/10/2021 1107   EOSABS 0.3 01/10/2021 1107   BASOSABS 0.1 01/10/2021 1107    CMP     Component Value Date/Time   NA 141 01/10/2021 1119   K 3.6 01/10/2021 1119   CL 105 01/10/2021 1119   CO2 28 12/18/2020 0149   GLUCOSE 107 (H) 01/10/2021 1119   BUN 12 01/10/2021 1119   CREATININE 0.70 01/10/2021 1119   CREATININE 0.92 06/06/2019 1435   CALCIUM 9.2 12/18/2020 0149   PROT 6.7 12/17/2020 0530   ALBUMIN 3.7 12/17/2020 0530   AST 26 12/17/2020 0530    ALT 27 12/17/2020 0530   ALKPHOS 65 12/17/2020 0530   BILITOT 0.4 12/17/2020 0530   GFRNONAA >60 12/18/2020 0149   GFRNONAA 76 09/29/2016 1537   GFRAA >60 10/19/2019 1208   GFRAA 87 09/29/2016 1537   Imaging I have reviewed the images obtained:  CT-head-aspects 10.  Stable subtle encephalomalacia in the right occipital pole.  Previous basilar tip aneurysm stent assisted coiling visualized. CT angio head and neck: Stable stent assisted embolization of basilar tip limiting evaluation of adjacent vessels.  Unchanged focal high-grade stenosis of the right P2 PCA beyond the stent.  No new abnormality.  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 Given her history of multiple strokes that have involved posterior circulation, this could reflect another posterior circulation stroke. She was discharged last admission with outpatient cardiac monitoring. Comes back again with symptoms that were localized to the posterior circulation.  Outside the window for IV thrombolysis.  No emergent LVO on vascular imaging   Impression: Strokelike symptoms-needs further evaluation  Recommendations: MRI of the brain      Addendum MRI of the brain with a new additional punctate right cerebellar subacute focus of restricted diffusion.  Her presentation remains very puzzling.  She is far out from her procedure for all this to be a periprocedural complication of her basilar aneurysm coiling. Her transient symptoms seem to be out of proportion to what has been seen on the MRI.  The small right  cerebellar restricted diffusion is very less likely to explain any left-sided deficits. Other considerations include electrographic abnormality such as seizures. That said, I would also recommend doing hypercoagulable work-up for her strokes that have happened in addition to 30-day cardiac monitoring.  Discussed the case with Dr. Rosalin Hawking and Dr. Kathyrn Sheriff over the phone.  Updated recommendations: - Keppra 1 g IV load now followed by Keppra 500 twice daily p.o. - Can consider outpatient EEG - Hypercoagulable panel -30-day cardiac monitoring-has the monitor at home but has not applied on her yet-instructed to apply.  Maintain seizure precautions Follow-up with outpatient neurology in the next 30 days  Plan discussed with Dr. Roderic Palau in the ER.   -- Amie Portland, MD Neurologist [facility] Pager: [phone]

## 2021-01-10 NOTE — ED Provider Notes (Addendum)
Emergency Medicine Provider Triage Evaluation Note  Tiffany Horn , a 60 y.o. female  was evaluated in triage.  Pt complains of left-sided weakness associated with occipital headache that started at 7AM this morning. Admits to bilateral blurry vision. Patient seen by Dr. Leonie Man with neurology prior to arrival and sent to the ED for possible CVA. Patient had aneurysm with coiling and stent placement by Dr. Kathyrn Sheriff in October 2022. She had a CVA on 11/5 and 11/17.    Review of Systems  Positive: Weakness, headache, visual changes Negative: fever  Physical Exam  LMP 01/31/2007  Gen:   Awake, no distress   Resp:  Normal effort  MSK:   Moves extremities without difficulty  Other:  Mild decreased strength to left upper and lower extremity. No facial droop. Normal speech  Medical Decision Making  Medically screening exam initiated at 11:04 AM.  Appropriate orders placed.  Tiffany Horn was informed that the remainder of the evaluation will be completed by another provider, this initial triage assessment does not replace that evaluation, and the importance of remaining in the ED until their evaluation is complete.  Code stroke activated in triage. Charge informed.    Tiffany Bouchard, PA-C 01/10/21 1109    Tiffany Bouchard, PA-C 01/10/21 1109    Tiffany Ferguson, MD 01/10/21 [phone]

## 2021-01-10 NOTE — ED Provider Notes (Signed)
[facility] EMERGENCY DEPARTMENT Provider Note   CSN: [id number] Arrival date & time: 01/10/21  1053     History Chief Complaint  Patient presents with   Code Stroke   Headache   Nausea   Weakness    Tiffany Horn is a 60 y.o. female.  Patient with a history of a CVA and a call placed for aneurysm.  Along with a stent placed.  Patient states she woke up today and had a headache and left arm weakness  The history is provided by medical records and the patient. No language interpreter was used.  Headache Pain location:  Generalized Quality:  Dull Radiates to:  Does not radiate Severity currently:  4/10 Severity at highest:  7/10 Onset quality:  Sudden Timing:  Constant Progression:  Waxing and waning Chronicity:  Recurrent Context: not activity   Associated symptoms: weakness   Associated symptoms: no abdominal pain, no back pain, no congestion, no cough, no diarrhea, no fatigue, no seizures and no sinus pressure   Weakness Associated symptoms: headaches   Associated symptoms: no abdominal pain, no chest pain, no cough, no diarrhea, no frequency and no seizures       Past Medical History:  Diagnosis Date   Allergy    Anxiety    Arthritis    knees and spine, shoulder   Asthma    Bronchitis    hx - recurrent   Complication of anesthesia    waking up is not easy   Depression    Elevated IgE level 09/12/2017   09/12/2017 IgE 195   H/O miscarriage, not currently pregnant    Hx of irritable bowel syndrome    x2   Hyperlipidemia    diet controlled - no medication   Hypertension    not taking any meds at present - under control per patient   Hypokalemia    with PNA admission (2.5)   Ischemic cerebrovascular accident (CVA) ([place])    Migraines    Pneumonia    4 episodes; hosp. 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   Family history of heart disease 09/03/2019   Mild intermittent asthma 07/25/2019   Osteoporosis 06/12/2019   Elevated LFTs 06/06/2019   Hematuria 06/06/2019   Elevated IgE level 09/12/2017   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 [facility]-2017 LAST   DILATION AND CURETTAGE OF UTERUS     IR ANGIO INTRA EXTRACRAN SEL INTERNAL CAROTID BILAT MOD SED  11/19/2020   IR ANGIO VERTEBRAL SEL VERTEBRAL BILAT MOD SED  11/19/2020   IR ANGIOGRAM FOLLOW UP STUDY  11/19/2020   IR ANGIOGRAM FOLLOW UP STUDY  11/19/2020   IR ANGIOGRAM FOLLOW UP STUDY  11/19/2020   IR ANGIOGRAM FOLLOW UP STUDY  11/19/2020   IR ANGIOGRAM FOLLOW UP STUDY  11/19/2020   IR ANGIOGRAM FOLLOW UP STUDY  11/19/2020   IR ANGIOGRAM FOLLOW UP STUDY  11/19/2020   IR ANGIOGRAM FOLLOW UP STUDY  11/19/2020   IR INTRA CRAN STENT  11/19/2020   IR TRANSCATH/EMBOLIZ  11/19/2020   LAPAROSCOPIC ABDOMINAL EXPLORATION  01/31/1992   endometriosis   ORIF HUMERUS FRACTURE Left 04/01/2013   DR Ninfa Linden - shoulder   ORIF HUMERUS FRACTURE Left 04/01/2013   Procedure: OPEN REDUCTION INTERNAL FIXATION (ORIF) LEFT PROXIMAL HUMERUS FRACTURE;  Surgeon: Mcarthur Rossetti, MD;  Location: [place];  Service:  Orthopedics;  Laterality: Left;   RADIOLOGY WITH ANESTHESIA N/A 11/19/2020   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;  Surgeon: Consuella Lose, MD;  Location: [place];  Service: Radiology;  Laterality: N/A;   TONSILLECTOMY AND ADENOIDECTOMY       OB History     Gravida  2   Para  0   Term  0   Preterm  0   AB  2   Living  0      SAB  2   IAB      Ectopic      Multiple      Live Births              Family History  Problem Relation Age of Onset   Breast cancer Mother    Diabetes Mother    Heart disease Mother    Diabetes Father    Dementia Father    Colon cancer Father    Stroke Paternal Grandmother    Diabetes Paternal Grandmother    Colon polyps Neg Hx    Esophageal cancer Neg Hx    Stomach cancer Neg Hx    Rectal cancer Neg Hx     Social History   Tobacco Use   Smoking status: Never   Smokeless tobacco: Never  Vaping Use   Vaping Use: Never used  Substance Use Topics   Alcohol use: Yes    Alcohol/week: 2.0 standard drinks    Types: 2 Glasses of wine per week    Comment: OCC / SOCIAL  MAYBE 2 A WEEK   Drug use: No    Home Medications Prior to Admission medications   Medication Sig Start Date End Date Taking? Authorizing Provider  levETIRAcetam (KEPPRA) 500 MG tablet Take 1 tablet (500 mg total) by mouth 2 (two) times daily. 01/10/21  Yes Milton Ferguson, MD  rizatriptan (MAXALT) 5 MG tablet Take 1 tablet (5 mg total) by mouth daily as needed for migraine. 11/25/20  Yes Kuneff, Renee A, DO  amLODipine (NORVASC) 10 MG tablet Take 1 tablet (10 mg total) by mouth daily. 12/19/20   Eugenie Filler, MD  Ascorbic Acid (VITAMIN C) 1000 MG tablet Take 1,000 mg by mouth daily.    [provider]  aspirin 81 MG chewable tablet Chew 1 tablet (81 mg total) by mouth daily. 11/22/20   Consuella Lose, MD  atorvastatin (LIPITOR) 80 MG tablet Take 1 tablet (80 mg total) by mouth daily. 12/07/20   Mercy Riding, MD  B Complex  Vitamins (B COMPLEX PO) Take 1 tablet by mouth daily.    [provider]  Budeson-Glycopyrrol-Formoterol (BREZTRI AEROSPHERE) 160-9-4.8 MCG/ACT AERO Inhale 2 puffs into the lungs in the morning and  at bedtime. 04/28/20   Icard, Octavio Graves, DO  clonazePAM (KLONOPIN) 0.5 MG tablet Take 1 tablet (0.5 mg total) by mouth 3 (three) times daily as needed for anxiety. Patient not taking: Reported on 01/10/2021 04/23/20   Donnal Moat T, PA-C  ezetimibe (ZETIA) 10 MG tablet Take 1 tablet (10 mg total) by mouth daily. 12/18/20   Eugenie Filler, MD  fluticasone furoate-vilanterol (BREO ELLIPTA) 100-25 MCG/ACT AEPB Inhale 1 puff into the lungs daily as needed.    [provider]  levothyroxine (SYNTHROID) 112 MCG tablet Take 1 tablet (112 mcg total) by mouth daily. Patient taking differently: Take 112 mcg by mouth at bedtime. 11/25/20   Kuneff, Renee A, DO  LORazepam (ATIVAN) 0.5 MG tablet Take 0.5 mg by mouth daily as needed. 12/29/20   [provider]  MAGNESIUM GLYCINATE PO Take 1 tablet by mouth at bedtime.    [provider]  ondansetron (ZOFRAN) 4 MG tablet Take 1 tablet by mouth as needed. 12/29/20   [provider]  PARoxetine (PAXIL) 40 MG tablet Take 1 tablet (40 mg total) by mouth daily. 04/23/20   Donnal Moat T, PA-C  Spacer/Aero-Holding Chambers DEVI Use daily with inhaler 04/28/20   Icard, Octavio Graves, DO  ticagrelor (BRILINTA) 90 MG TABS tablet Take 1 tablet (90 mg total) by mouth 2 (two) times daily. 11/22/20   Consuella Lose, MD  umeclidinium bromide (INCRUSE ELLIPTA) 62.5 MCG/ACT AEPB Inhale 1 puff into the lungs daily.    [provider]    Allergies    Abilify [aripiprazole], Amoxicillin, Promethazine hcl, and Seroquel [quetiapine fumarate]  Review of Systems   Review of Systems  Constitutional:  Negative for appetite change and fatigue.  HENT:  Negative for congestion, ear discharge and sinus pressure.   Eyes:  Negative for  discharge.  Respiratory:  Negative for cough.   Cardiovascular:  Negative for chest pain.  Gastrointestinal:  Negative for abdominal pain and diarrhea.  Genitourinary:  Negative for frequency and hematuria.  Musculoskeletal:  Negative for back pain.  Skin:  Negative for rash.  Neurological:  Positive for weakness and headaches. Negative for seizures.  Psychiatric/Behavioral:  Negative for hallucinations.    Physical Exam Updated Vital Signs BP (!) 161/100   Pulse 72   Temp 98.4 F (36.9 C)   Resp 11   LMP 01/31/2007   SpO2 100%   Physical Exam Constitutional:      Appearance: She is well-developed.  HENT:     Head: Normocephalic.     Nose: Nose normal.  Eyes:     General: No scleral icterus.    Conjunctiva/sclera: Conjunctivae normal.  Neck:     Thyroid: No thyromegaly.  Cardiovascular:     Rate and Rhythm: Normal rate and regular rhythm.     Heart sounds: No murmur heard.   No friction rub. No gallop.  Pulmonary:     Breath sounds: No stridor. No wheezing or rales.  Chest:     Chest wall: No tenderness.  Abdominal:     General: There is no distension.     Tenderness: There is no abdominal tenderness. There is no rebound.  Musculoskeletal:        General: Normal range of motion.     Cervical back: Neck supple.  Lymphadenopathy:     Cervical: No cervical adenopathy.  Skin:    Findings: No erythema or rash.  Neurological:     Mental Status: She is alert and oriented to person, place, and  time.     Motor: No abnormal muscle tone.     Coordination: Coordination normal.  Psychiatric:        Behavior: Behavior normal.    ED Results / Procedures / Treatments   Labs (all labs ordered are listed, but only abnormal results are displayed) Labs Reviewed  COMPREHENSIVE METABOLIC PANEL - Abnormal; Notable for the following components:      Result Value   Glucose, Bld 119 (*)    All other components within normal limits  URINALYSIS, ROUTINE W REFLEX MICROSCOPIC -  Abnormal; Notable for the following components:   Hgb urine dipstick TRACE (*)    Leukocytes,Ua TRACE (*)    All other components within normal limits  I-STAT CHEM 8, ED - Abnormal; Notable for the following components:   Glucose, Bld 107 (*)    All other components within normal limits  RESP PANEL BY RT-PCR (FLU A&B, COVID) ARPGX2  ETHANOL  PROTIME-INR  APTT  CBC  DIFFERENTIAL  RAPID URINE DRUG SCREEN, HOSP PERFORMED  URINALYSIS, MICROSCOPIC (REFLEX)  ANTITHROMBIN III  PROTEIN C ACTIVITY  PROTEIN C, TOTAL  PROTEIN S ACTIVITY  PROTEIN S, TOTAL  LUPUS ANTICOAGULANT PANEL  BETA-2-GLYCOPROTEIN I ABS, IGG/M/A  HOMOCYSTEINE  FACTOR 5 LEIDEN  PROTHROMBIN GENE MUTATION  CARDIOLIPIN ANTIBODIES, IGG, IGM, IGA  I-STAT BETA HCG BLOOD, ED ([facility], [facility], [facility] ONLY)  CBG MONITORING, ED    EKG None  Radiology MR BRAIN WO CONTRAST  Result Date: 01/10/2021 CLINICAL DATA:  Neuro deficit, acute, stroke suspected. Left-sided weakness, blurry vision, and occipital headache. EXAM: MRI HEAD WITHOUT CONTRAST TECHNIQUE: Multiplanar, multiecho pulse sequences of the brain and surrounding structures were obtained without intravenous contrast. COMPARISON:  Head CT 01/10/2021 and MRI 12/17/2020 FINDINGS: Brain: There is a 2 mm focus of trace diffusion weighted signal hyperintensity and possible reduced ADC in the right cerebellar hemisphere (series 3, image 11 and series 4, image 9). This is in a different location than the 2 small foci of diffusion abnormality on the prior MRI which have resolved. Small foci of T2 hyperintensity and faint diffusion weighted signal abnormality in the right occipital lobe correspond to evolving late subacute infarction on the prior MRI. No mass, midline shift, or extra-axial fluid collection is identified. The ventricles and sulci are normal. Chronic cerebral microhemorrhages on the prior MRI are not well shown on today's study which does not include susceptibility weighted imaging.  Vascular: Major intracranial vascular flow voids are preserved. Prior embolization of a basilar tip aneurysm. Skull and upper cervical spine: Unremarkable bone marrow signal. Sinuses/Orbits: Unremarkable orbits. Paranasal sinuses and mastoid air cells are clear. Other: None. IMPRESSION: 1. New punctate acute to early subacute right cerebellar infarct. 2. Evolving late subacute right occipital infarcts. Electronically Signed   By: Logan Bores M.D.   On: 01/10/2021 15:09   CT HEAD CODE STROKE WO CONTRAST  Result Date: 01/10/2021 CLINICAL DATA:  Code stroke. 60 year old female status post right side posterior circulation infarcts last month. Status post stent assisted embolization of basilar artery aneurysm in October. Acute left side weakness and occipital headache at 0700 hours. EXAM: CT HEAD WITHOUT CONTRAST TECHNIQUE: Contiguous axial images were obtained from the base of the skull through the vertex without intravenous contrast. COMPARISON:  Brain MRI 12/17/2020. CT head and CTA head and neck 12/17/2020. FINDINGS: Brain: Stable gray-white matter differentiation throughout the brain., with subtle encephalomalacia at the right occipital pole by CT. No midline shift, ventriculomegaly, mass effect, evidence of mass lesion, intracranial hemorrhage or  evidence of cortically based acute infarction. Cerebellum remains within normal limits by CT. Vascular: Embolization coil pack with adjacent vascular stents at the basilar tip. No suspicious intracranial vascular hyperdensity. Skull: Stable, negative. Sinuses/Orbits: Visualized paranasal sinuses and mastoids are stable and well aerated. Other: Visualized orbits and scalp soft tissues are within normal limits. ASPECTS [facility] Stroke Program Early CT Score) Total score (0-10 with 10 being normal): 10 IMPRESSION: 1. No acute intracranial hemorrhage or acute cortically based infarct identified. ASPECTS 10. 2. Stable subtle encephalomalacia at the right occipital pole.  Previous basilar tip aneurysm stent assisted coil embolization. 3. These results were communicated to Dr. Rory Percy at 11:41 am on 01/10/2021 by text page via the [facility] messaging system. Electronically Signed   By: Genevie Ann M.D.   On: 01/10/2021 11:41   CT ANGIO HEAD NECK W WO CM (CODE STROKE)  Result Date: 01/10/2021 CLINICAL DATA:  Neuro deficit, acute, stroke suspected EXAM: CT ANGIOGRAPHY HEAD AND NECK TECHNIQUE: Multidetector CT imaging of the head and neck was performed using the standard protocol during bolus administration of intravenous contrast. Multiplanar CT image reconstructions and MIPs were obtained to evaluate the vascular anatomy. Carotid stenosis measurements (when applicable) are obtained utilizing NASCET criteria, using the distal internal carotid diameter as the denominator. CONTRAST:  85mL OMNIPAQUE IOHEXOL 350 MG/ML SOLN COMPARISON:  12/17/2020 FINDINGS: CTA NECK Aortic arch: Great vessel origins are patent. 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 Right carotid system: Patent. No stenosis. Retropharyngeal course of the ICA. Left carotid system: Patent.  No stenosis. Vertebral arteries: Patent and codominant.  No stenosis. Skeleton: Stable degenerative changes of the included spine. Other neck: No new abnormality. Upper chest: No new abnormality. Review of the MIP images confirms the above findings CTA HEAD Anterior circulation: Intracranial internal carotid arteries are patent. The petrous portions protrude slightly toward the middle ear clefts with thin covering bone. Anterior and middle cerebral arteries are patent. Posterior circulation: Intracranial vertebral arteries are patent. The basilar artery is patent and partially obscured by stent assisted coiling of basilar tip aneurysm extending into right PCA. As before, there is poor evaluation the P1 segments. There is unchanged focal high-grade stenosis of the right P2 PCA just beyond the stent marker with flow present  distally. Visualized left posterior cerebral artery is patent. Venous sinuses: Patent as allowed by contrast bolus timing. Review of the MIP images confirms the above findings IMPRESSION: No new abnormality. Stable appearance of stent assisted embolization of basilar tip aneurysm limiting evaluation of adjacent vessels. Unchanged focal high-grade stenosis right P2 PCA beyond the stent. Electronically Signed   By: Macy Mis M.D.   On: 01/10/2021 12:01    Procedures Procedures   Medications Ordered in ED Medications  levETIRAcetam (KEPPRA) IVPB 1000 mg/100 mL premix (has no administration in time range)  iohexol (OMNIPAQUE) 350 MG/ML injection 75 mL (75 mLs Intravenous Contrast Given 01/10/21 1139)    ED Course  I have reviewed the triage vital signs and the nursing notes.  Pertinent labs & imaging results that were available during my care of the patient were reviewed by me and considered in my medical decision making (see chart for details). CRITICAL CARE Performed by: Milton Ferguson Total critical care time: 40 minutes Critical care time was exclusive of separately billable procedures and treating other patients. Critical care was necessary to treat or prevent imminent or life-threatening deterioration. Critical care was time spent personally by me on the following activities: development of treatment plan with  patient and/or surrogate as well as nursing, discussions with consultants, evaluation of patient's response to treatment, examination of patient, obtaining history from patient or surrogate, ordering and performing treatments and interventions, ordering and review of laboratory studies, ordering and review of radiographic studies, pulse oximetry and re-evaluation of patient's condition. Patient with questionable stroke symptoms.  MRI shows.  Right cerebellar infarct.  Patient was seen by neurology and recommend starting patient on Keppra they will order a hypercoagulability panel and  have her follow-up with neurology and neurosurgery   MDM Rules/Calculators/A&P                           Left arm weakness that has resolved along with a headache that has resolved.  Possible small cerebral infarct.  Patient seen by neurology and outpatient arrangement has been done Final Clinical Impression(s) / ED Diagnoses Final diagnoses:  Weakness    Rx / DC Orders ED Discharge Orders          Ordered    levETIRAcetam (KEPPRA) 500 MG tablet  2 times daily        01/10/21 1558             Milton Ferguson, MD 01/10/21 1606

## 2021-01-10 NOTE — Patient Instructions (Signed)
I had a long discussion with the patient regarding her complaints of severe occipital headache as well as dizziness and leaning to the left which developed this morning.  I recommend that she go to the emergency room urgently for evaluation for the same and get a CT scan of the head and further evaluation there as necessary.  She voiced understanding.  I offered to call an ambulance but the patient felt quite comfortable in going there herself and declined.  Continue aspirin and Brilinta for stroke prevention given her basilar aneurysm coiling and maintain aggressive risk factor modification.  Return for follow-up in 3 months or call earlier if necessary.

## 2021-01-10 NOTE — Progress Notes (Signed)
[facility] [address] [place]. [place] [address] [phone]       OFFICE FOLLOW-UP NOTE  [name] Date of Birth:  12/28/1960 Medical Record Number:  [id number]   HPI: [name] is a 60 year old Caucasian lady seen today for initial office follow-up visit following hospital consultation in November 2022.  He has past medical history of hypertension, hyperlipidemia, asthma, bronchitis, anxiety and thyroid disease.  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 Kathyrn Sheriff on 11/19/2020.  She was seen in the ER 3 weeks later with headaches, dizziness and imbalance.  She was seen in [place], ER and had an MRI scan of the brain done on 12/02/2020 which showed small focus of acute infarct in the right thalamus and right occipital lobe with a right PCA territory coil mass near the tip of the basilar artery residual aneurysm filling noted.  CT angiogram of the brain done on 12/04/2020 showed similar findings.  Patient presented again to the hospital on 12/16/2020 with sudden onset of vertigo, blurred vision and left-sided weakness.  Repeat MRI at this time showed 2 additional infarcts which are punctate in the right cerebellum with expected evolutionary changes of the previously seen right thalamic and right occipital.  She had severe headache at this time as well.  CT angiogram of the head and neck showed focal high-grade stenosis of the right P2 after the end of the stent with diminutive reconstitution of flow in the remainder of the right PCA which is unchanged.  Stable stent assisted coil embolization of the basilar tip aneurysm.  Hemoglobin A1c was 5.4, LDL cholesterol 156 mg percent and 2D echo showed ejection fraction of 60 to 65%.  Lower extremity venous Dopplers 1 negative for DVT.  TCD bubble study was negative for right-to-left shunt.  Patient was continued on aspirin and Brilinta and Lipitor..30-day heart monitor was ordered but so far has  not been done.  Patient states she was doing quite well until this morning when she woke up with severe occipital headache as well as feeling dizzy, off balance and leaning to the left.  Since then the headache has subsided somewhat but not completely gone.  She still feeling nauseous and dizzy and had some blurred vision as well slightly off balance.  I advised her to go to the emergency room for further evaluation for stroke she appeared reluctant initially but then agreed.  I recommended she go by ambulance but patient declined.  She has presented outside time window for thrombolysis and clinical exam is not suggestive of L ROS:   14 system review of systems is positive for headache, neck pain, dizziness, blurred vision, weakness, incoordination, ataxia and all other systems negative  PMH:  Past Medical History:  Diagnosis Date   Allergy    Anxiety    Arthritis    knees and spine, shoulder   Asthma    Bronchitis    hx - recurrent   Complication of anesthesia    waking up is not easy   Depression    Elevated IgE level 09/12/2017   09/12/2017 IgE 195   H/O miscarriage, not currently pregnant    Hx of irritable bowel syndrome    x2   Hyperlipidemia    diet controlled - no medication   Hypertension    not taking any meds at present - under control per patient   Hypokalemia    with PNA admission (2.5)   Ischemic cerebrovascular accident (CVA) ([place])  Migraines    Pneumonia    4 episodes; hosp. admission 2014   PONV (postoperative nausea and vomiting)    Thyroid disease    UC (ulcerative colitis) [facility])    DX'D 2021    Social History:  Social History   Socioeconomic History   Marital status: Divorced    Spouse name: Not on file   Number of children: 0   Years of education: Not on file   Highest education level: Not on file  Occupational History   Occupation: Control and instrumentation engineer  Tobacco Use   Smoking status: Never   Smokeless tobacco: Never  Vaping Use   Vaping Use:  Never used  Substance and Sexual Activity   Alcohol use: Yes    Alcohol/week: 2.0 standard drinks    Types: 2 Glasses of wine per week    Comment: OCC / SOCIAL  MAYBE 2 A WEEK   Drug use: No   Sexual activity: Not Currently    Partners: Male    Birth control/protection: Post-menopausal  Other Topics Concern   Not on file  Social History Narrative   She is originally from [place]. She has traveled to [facility], [place], [place], [place], [place], , [place], [place], [place]. No international travel. She has dogs. No prior bird, mold, or recent hot tub exposure. She hasn't used her hot tub in 1.5 years. 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 Previously enjoyed gardening and playing tennis. Helps to care for her mother.   Social Determinants of Health   Financial Resource Strain: Not on file  Food Insecurity: Not on file  Transportation Needs: Not on file  Physical Activity: Not on file  Stress: Not on file  Social Connections: Not on file  Intimate Partner Violence: Not on file    Medications:   Current Outpatient Medications on File Prior to Visit  Medication Sig Dispense Refill   amLODipine (NORVASC) 10 MG tablet Take 1 tablet (10 mg total) by mouth daily. 90 tablet 1   Ascorbic Acid (VITAMIN C) 1000 MG tablet Take 1,000 mg by mouth daily.     aspirin 81 MG chewable tablet Chew 1 tablet (81 mg total) by mouth daily. 30 tablet 3   atorvastatin (LIPITOR) 80 MG tablet Take 1 tablet (80 mg total) by mouth daily. 90 tablet 1   B Complex Vitamins (B COMPLEX PO) Take 1 tablet by mouth daily.     Budeson-Glycopyrrol-Formoterol (BREZTRI AEROSPHERE) 160-9-4.8 MCG/ACT AERO Inhale 2 puffs into the lungs in the morning and at bedtime. 5.9 g 1   ezetimibe (ZETIA) 10 MG tablet Take 1 tablet (10 mg total) by mouth daily. 30 tablet 1   fluticasone furoate-vilanterol (BREO ELLIPTA) 100-25 MCG/ACT AEPB Inhale 1 puff into the lungs daily as needed.     levothyroxine  (SYNTHROID) 112 MCG tablet Take 1 tablet (112 mcg total) by mouth daily. (Patient taking differently: Take 112 mcg by mouth at bedtime.) 30 tablet 0   LORazepam (ATIVAN) 0.5 MG tablet Take 0.5 mg by mouth daily as needed.     MAGNESIUM GLYCINATE PO Take 1 tablet by mouth at bedtime.     ondansetron (ZOFRAN) 4 MG tablet Take 1 tablet by mouth as needed.     PARoxetine (PAXIL) 40 MG tablet Take 1 tablet (40 mg total) by mouth daily. 90 tablet 1   rizatriptan (MAXALT) 5 MG tablet Take 1 tablet (5 mg total) by mouth daily as needed for  migraine. 10 tablet 5   Spacer/Aero-Holding Chambers DEVI Use daily with inhaler 1 each 0   ticagrelor (BRILINTA) 90 MG TABS tablet Take 1 tablet (90 mg total) by mouth 2 (two) times daily. 60 tablet 3   umeclidinium bromide (INCRUSE ELLIPTA) 62.5 MCG/ACT AEPB Inhale 1 puff into the lungs daily.     clonazePAM (KLONOPIN) 0.5 MG tablet Take 1 tablet (0.5 mg total) by mouth 3 (three) times daily as needed for anxiety. (Patient not taking: Reported on 01/10/2021) 90 tablet 5   Current Facility-Administered Medications on File Prior to Visit  Medication Dose Route Frequency Provider Last Rate Last Admin   0.9 %  sodium chloride infusion  500 mL Intravenous Once Nandigam, Venia Minks, MD        Allergies:   Allergies  Allergen Reactions   Abilify [Aripiprazole] Nausea And Vomiting   Amoxicillin Diarrhea   Promethazine Hcl    Seroquel [Quetiapine Fumarate]     Unknown    Physical Exam General: well developed, well nourished middle-aged Caucasian lady, seated, in no evident distress Head: head normocephalic and atraumatic.  Neck: supple with no carotid or supraclavicular bruits Cardiovascular: regular rate and rhythm, no murmurs Musculoskeletal: no deformity mild tenderness in the left occipital region in the midline. Skin:  no rash/petichiae Vascular:  Normal pulses all extremities Vitals:   01/10/21 0958  BP: 134/88  Pulse: 72   Neurologic Exam Mental  Status: Awake and fully alert. Oriented to place and time. Recent and remote memory intact. Attention span, concentration and fund of knowledge appropriate. Mood and affect appropriate.  Cranial Nerves: Fundoscopic exam reveals sharp disc margins. Pupils equal, briskly reactive to light. Extraocular movements full without nystagmus. Visual fields full to confrontation. Hearing intact. Facial sensation intact. Face, tongue, palate moves normally and symmetrically.  Motor: Normal bulk and tone. Normal strength in all tested extremity muscles. Sensory.: intact to touch ,pinprick .position and vibratory sensation.  Coordination: Mild right finger-to-nose incoordination.   Gait and Station: Arises from chair without difficulty. Stance is normal. Gait demonstrates normal stride length and balance .  Unable to do tandem walking.  Leans a bit to the left while standing on a narrow base.   Reflexes: 1+ and symmetric. Toes downgoing.   NIHSS  1 Modified Rankin  2   ASSESSMENT: 60 year old Caucasian lady with sudden onset of severe occipital headache as well as subjective dizziness and leaning to the left side of unclear etiology which began this morning.Marland KitchenPossibilities include recurrent stroke versus worsening of old deficits which needs further evaluation.  History of basilar tip aneurysm treated with stent assisted coiling in October 2022 with perioperative small right cerebellar and thalamic infarcts in November 2022. She presented this morning outside time window for thrombolysis and clinical exam is not consistent with large vessel occlusion    PLAN:I had a long discussion with the patient regarding her complaints of severe occipital headache as well as dizziness and leaning to the left which developed this morning.  I recommend that she go to the emergency room urgently for evaluation for the same and get a CT scan of the head and further evaluation there as necessary.  She voiced understanding.  I  offered to call an ambulance but the patient felt quite comfortable in going there herself and declined.  Continue aspirin and Brilinta for stroke prevention given her basilar aneurysm coiling and maintain aggressive risk factor modification.  Return for follow-up in 3 months or call earlier if necessary.Greater than 50% of  time during this 40 minute prolonged visit was spent on counseling,explanation of diagnosis, planning of further management, discussion with patient and family and coordination of care Tiffany Contras, MD Note: This document was prepared with digital dictation and possible smart phrase technology. Any transcriptional errors that result from this process are unintentional

## 2021-01-10 NOTE — Discharge Instructions (Signed)
Follow-up with your neurologist and your neurosurgeon as scheduled, return if any problem

## 2021-01-10 NOTE — ED Triage Notes (Signed)
Pt. Stated, I was normal in when I went to bed last night , I woke up this morning with headache left side weakness. Previous stroke.

## 2021-01-11 ENCOUNTER — Ambulatory Visit: Payer: BC Managed Care – PPO | Admitting: Rehabilitative and Restorative Service Providers"

## 2021-01-11 ENCOUNTER — Ambulatory Visit: Payer: BC Managed Care – PPO

## 2021-01-11 LAB — HOMOCYSTEINE: Homocysteine: 13.5 umol/L (ref 0.0–14.5)

## 2021-01-12 LAB — LUPUS ANTICOAGULANT PANEL
DRVVT: 38 s (ref 0.0–47.0)
PTT Lupus Anticoagulant: 30 s (ref 0.0–51.9)

## 2021-01-12 LAB — PROTEIN C, TOTAL: Protein C, Total: 148 % (ref 60–150)

## 2021-01-12 LAB — PROTEIN C ACTIVITY: Protein C Activity: 160 % (ref 73–180)

## 2021-01-12 LAB — PROTEIN S ACTIVITY: Protein S Activity: 89 % (ref 63–140)

## 2021-01-12 LAB — PROTEIN S, TOTAL: Protein S Ag, Total: 95 % (ref 60–150)

## 2021-01-13 LAB — CARDIOLIPIN ANTIBODIES, IGG, IGM, IGA
Anticardiolipin IgA: <9 APL U/mL (ref 0–11)
Anticardiolipin IgG: <9 GPL U/mL (ref 0–14)
Anticardiolipin IgM: <9 MPL U/mL (ref 0–12)

## 2021-01-13 LAB — BETA-2-GLYCOPROTEIN I ABS, IGG/M/A
Beta-2 Glyco I IgG: <9 GPI IgG units (ref 0–20)
Beta-2-Glycoprotein I IgA: <9 GPI IgA units (ref 0–25)
Beta-2-Glycoprotein I IgM: <9 GPI IgM units (ref 0–32)

## 2021-01-14 LAB — PROTHROMBIN GENE MUTATION

## 2021-01-17 ENCOUNTER — Ambulatory Visit: Payer: BC Managed Care – PPO

## 2021-01-17 ENCOUNTER — Telehealth: Payer: Self-pay | Admitting: Neurology

## 2021-01-17 ENCOUNTER — Other Ambulatory Visit: Payer: Self-pay

## 2021-01-17 ENCOUNTER — Encounter: Payer: Self-pay | Admitting: Physical Therapy

## 2021-01-17 ENCOUNTER — Ambulatory Visit: Payer: BC Managed Care – PPO | Admitting: Physical Therapy

## 2021-01-17 DIAGNOSIS — R4701 Aphasia: Secondary | ICD-10-CM

## 2021-01-17 DIAGNOSIS — R2681 Unsteadiness on feet: Secondary | ICD-10-CM

## 2021-01-17 DIAGNOSIS — R41841 Cognitive communication deficit: Secondary | ICD-10-CM

## 2021-01-17 DIAGNOSIS — R2689 Other abnormalities of gait and mobility: Secondary | ICD-10-CM

## 2021-01-17 DIAGNOSIS — R42 Dizziness and giddiness: Secondary | ICD-10-CM

## 2021-01-17 LAB — FACTOR 5 LEIDEN

## 2021-01-17 NOTE — Telephone Encounter (Signed)
Pt is asking for a call with the results to the blood work that Dr Leonie Man sent her to the hospital for.Pt states she was told the results would come back to Dr Leonie Man since he is her Neurologist  Please call.

## 2021-01-17 NOTE — Patient Instructions (Signed)
° °  Download Constant Therapy and start working on that for the next 2 weeks.

## 2021-01-17 NOTE — Telephone Encounter (Signed)
She was sent to ED from our office on 01/10/21. She is inquiring about the lab work completed at the hospital that day.

## 2021-01-17 NOTE — Therapy (Addendum)
[facility] [place], [place], [place], [address] Phone: [phone]   Fax:  [phone]  Physical Therapy Treatment  Patient Details  Name: Tiffany Horn MRN: [id number] Date of Birth: Aug 07, 1960 Referring Provider (PT): Kathyrn Sheriff   Encounter Date: 01/17/2021   PT End of Session - 01/17/21 1537     Visit Number 3    Number of Visits 17    Date for PT Re-Evaluation 02/18/21    Authorization Type BCBS    PT Start Time 1539    PT Stop Time 1623    PT Time Calculation (min) 44 min    Activity Tolerance Patient tolerated treatment well;Patient limited by fatigue   headache comes on with sit>L sidelying>sit edge of mat   Behavior During Therapy [facility] for tasks assessed/performed             Past Medical History:  Diagnosis Date   Allergy    Anxiety    Arthritis    knees and spine, shoulder   Asthma    Bronchitis    hx - recurrent   Complication of anesthesia    waking up is not easy   Depression    Elevated IgE level 09/12/2017   09/12/2017 IgE 195   H/O miscarriage, not currently pregnant    Hx of irritable bowel syndrome    x2   Hyperlipidemia    diet controlled - no medication   Hypertension    not taking any meds at present - under control per patient   Hypokalemia    with PNA admission (2.5)   Ischemic cerebrovascular accident (CVA) ([place])    Migraines    Pneumonia    4 episodes; hosp. admission 2014   PONV (postoperative nausea and vomiting)    Thyroid disease    UC (ulcerative colitis) [facility])    DX'D 2021    Past Surgical History:  Procedure Laterality Date   BREAST SURGERY     implants, then had them removed   COLONOSCOPY     greater 10 yrs ago - ? [facility]-2017 LAST   DILATION AND CURETTAGE OF UTERUS     IR ANGIO INTRA EXTRACRAN SEL INTERNAL CAROTID BILAT MOD SED  11/19/2020   IR ANGIO VERTEBRAL SEL VERTEBRAL BILAT MOD SED  11/19/2020   IR ANGIOGRAM FOLLOW UP STUDY  11/19/2020   IR  ANGIOGRAM FOLLOW UP STUDY  11/19/2020   IR ANGIOGRAM FOLLOW UP STUDY  11/19/2020   IR ANGIOGRAM FOLLOW UP STUDY  11/19/2020   IR ANGIOGRAM FOLLOW UP STUDY  11/19/2020   IR ANGIOGRAM FOLLOW UP STUDY  11/19/2020   IR ANGIOGRAM FOLLOW UP STUDY  11/19/2020   IR ANGIOGRAM FOLLOW UP STUDY  11/19/2020   IR INTRA CRAN STENT  11/19/2020   IR TRANSCATH/EMBOLIZ  11/19/2020   LAPAROSCOPIC ABDOMINAL EXPLORATION  01/31/1992   endometriosis   ORIF HUMERUS FRACTURE Left 04/01/2013   DR Ninfa Linden - shoulder   ORIF HUMERUS FRACTURE Left 04/01/2013   Procedure: OPEN REDUCTION INTERNAL FIXATION (ORIF) LEFT PROXIMAL HUMERUS FRACTURE;  Surgeon: Mcarthur Rossetti, MD;  Location: [place];  Service: Orthopedics;  Laterality: Left;   RADIOLOGY WITH ANESTHESIA N/A 11/19/2020   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;  Surgeon: Consuella Lose, MD;  Location: [place];  Service: Radiology;  Laterality: N/A;   TONSILLECTOMY AND ADENOIDECTOMY      There were no vitals filed for this visit.   Subjective Assessment - 01/17/21 1536  Subjective Was in the ED last week, saw a new "hairline" event that hit near the basilar artery near the aneurysm area.  Saw Dr. Cleotilde Neer office, then Dr. Leonie Man, then to ED, as I had stabbing pain in the right back of my skull most of the day.  They are doing further blood work to rule out some other things.  Had a fever Wednesday and stayed in the bed through Friday.  I am feeling better.  No headaches over the past 4 days.    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  ED/hospital 11/5-11/7/22; ED 12/16/20 with blurred vision and L sided weakness; imaging shows evolution of R PCA infarct and subacute infarct R cerebellum)    Patient Stated Goals Pt's goals for therapy are to build strength in L side, work on balance.                                [place] Adult PT Treatment/Exercise - 01/17/21 0001       Ambulation/Gait   Ambulation/Gait Yes    Ambulation/Gait Assistance 6: Modified independent (Device/Increase time)    Assistive device None    Gait Pattern Step-through pattern;Decreased arm swing - right;Decreased arm swing - left    Ambulation Surface Level;Indoor      Self-Care   Self-Care Other Self-Care Comments    Other Self-Care Comments  Discussed pt's recent ED visit and neurologist visit.  PT reiterated to patient the importance of rest for optimal recovery.  Educated patient on activity level in general and trying to avoid too much activity that brings on such bad symptoms/headache that causes her to back off of activity for several days.  Reinforced purpose of habituation exercises.  Discussed pt's question regarding driving long distances, multiple trips over the Christmas weekend and initially recommended limited driving due to motion/light sensitivity.             Vestibular Treatment/Exercise - 01/17/21 0001       Vestibular Treatment/Exercise   Vestibular Treatment Provided Habituation    Habituation Exercises Horizontal Roll;Brandt Daroff      Nestor Lewandowsky   Number of Reps  1    Symptom Description  Attempted Repeated motion sit>L sidelying>sit, x 1 only.  With sit>sidelying, pt reports 4-5/10 swimmyhead, clears in approx 15-30 sec.  Upon sitting, reports swimmy headedness and headache, resolves in 1-2 minutes.  Headache continues.      Horizontal Roll   Number of Reps  4    Symptom Description  symptoms reduce with repetition  (improves from 9/10 to 4/10 by 4th rep)           Headache up to 8/10 after one rep sit to L sidelying and return to sit.   After 1-2 minutes sitting edge of mat, with cues for looking at visual target, pt reports symptoms of swimmy headedness decreased, but headache remains 8/10.  Gait 60 ft x 2 reps, with cues to focus on target ahead,  slowed turns to change directions.  Pt reports feeling the bright lights in gym are contributing to headache.  After approx. 5 minutes seated edge of mat in darker room,  Pt reports headache has decreased to 6/10.   Reviewed horizontal roll that was given to HEP last visit, with pt return demo understanding.      PT Education -  01/17/21 1719     Education Details See self-care    Person(s) Educated Patient    Methods Explanation    Comprehension Verbalized understanding              PT Short Term Goals - 12/21/20 1533       PT SHORT TERM GOAL #1   Title Pt will be independent with HEP for improved strength, balance, and gait.  TARGET 01/21/2021    Time 4    Period Weeks    Status New      PT SHORT TERM GOAL #2   Title Pt will rate dizziness/unsteadiness as less than or equal to 4/10 with functional gait activities.    Baseline 7/10 with FGA    Time 4    Period Weeks    Status New               PT Long Term Goals - 12/21/20 1534       PT LONG TERM GOAL #1   Title Pt will be independent with HEP for improved strength, balance, and gait.  TARGET 02/18/2021    Time 8    Period Weeks    Status New      PT LONG TERM GOAL #2   Title Pt will improve FGA to at least 25/30 for decreased fall risk.    Baseline 22/30    Time 8    Period Weeks    Status New      PT LONG TERM GOAL #3   Title Pt will rate dizziness/unsteadiness symtpoms as less than or equal to 2/10 with functional gait activiteis.    Time 8    Period Weeks    Status New      PT LONG TERM GOAL #4   Title Pt will verbalize understanding of fall prevention in home environment.    Time 8    Period Weeks    Status New                   Plan - 01/17/21 1720     Clinical Impression Statement Pt not seen since 01/04/2021, due to ED visit last week.  Pt has seen neurologist, has had bloodwork done, and presents to OPPT today feeling better with no headache x 4 days.  Educated pt again on  importance of rest and small bouts of activitiy to increase endurance and activity tolerance.  Pt is able to return demo horizontal rolling from HEP last visit and symptoms decrease with repetition.  With attempts at sit>L sidelying>sit, symptoms of dizziness come on as well as headache.  Will need to conitnue to work on habituation in smaller ranges for improved motion sensitivity.    PT Treatment/Interventions ADLs/Self Care Home Management;Gait training;Functional mobility training;Stair training;Therapeutic activities;Therapeutic exercise;Balance training;Neuromuscular re-education;Patient/family education;Vestibular    PT Next Visit Plan check HEP, endurance, dynamic gait and balance, habituation (try sit>sidelyign with pillows/propped on elbow OR seated head motions).  Discuss again pt's question about driving    Consulted and Agree with Plan of Care Patient             Patient will benefit from skilled therapeutic intervention in order to improve the following deficits and impairments:     Visit Diagnosis: Unsteadiness on feet  Other abnormalities of gait and mobility  Dizziness and giddiness     Problem List Patient Active Problem List   Diagnosis Date Noted   Acute CVA (cerebrovascular accident) ([place])    TIA (transient  ischemic attack) 12/17/2020   Hypokalemia 12/17/2020   Moderate persistent asthma 12/17/2020   Hypertension    CVA (cerebral vascular accident) ([place]) 12/04/2020   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[place]) 11/17/2020   Family history of heart disease 09/03/2019   Mild intermittent asthma 07/25/2019   Osteoporosis 06/12/2019   Elevated LFTs 06/06/2019   Hematuria 06/06/2019   Elevated IgE level 09/12/2017   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) 09/12/2017   Headache, migraine 02/15/2015   Vitamin D deficiency 10/19/2014   Encounter for preventive health examination 10/19/2014   Depression with anxiety 09/02/2014   Dyspnea on exertion 08/26/2014    Hyperlipidemia 08/18/2014   Hypothyroidism 08/18/2014   Obesity (BMI 30-39.9) 08/18/2014    Glema Takaki W., PT 01/17/2021, 5:25 PM  [facility] [address], [place], [place], [address] Phone: [phone]   Fax:  [phone]  Name: Shammara Jarrett MRN: [id number] Date of Birth: 1960-10-19

## 2021-01-18 ENCOUNTER — Other Ambulatory Visit (HOSPITAL_COMMUNITY): Payer: Self-pay

## 2021-01-18 NOTE — Telephone Encounter (Signed)
I spoke to the patient. She is aware of Dr. Georgina Peer response concerning her lab work. ______________________________________   She also wanted to review her ED discharge instructions. Documented as follows:  Patient was seen by neurology and recommend starting patient on Keppra. They will order a hypercoagulability panel and have her follow-up with neurology and neurosurgery. ______________________________________  She verbalized understanding. She will continue the prescribed generic Keppra from the hospital. Pending follow up here on 02/10/21. She will also contact neurosurgery to schedule a visit.

## 2021-01-18 NOTE — Telephone Encounter (Signed)
All the blood work from that day looks normal

## 2021-01-18 NOTE — Therapy (Signed)
[facility] [address] [place], [place], [address] Phone: [phone]   Fax:  [phone]  Speech Language Pathology Treatment  Patient Details  Name: Tiffany Horn MRN: [id number] Date of Birth: 04-29-1960 Referring Provider (SLP): Wendee Beavers MD (referring), Howard Pouch A NP (documentation)   Encounter Date: 01/17/2021   End of Session - 01/18/21 0913     Visit Number 3    Number of Visits 19    Date for SLP Re-Evaluation 02/25/21   added one week due to holiday   SLP Start Time 1451    SLP Stop Time  1532    SLP Time Calculation (min) 41 min    Activity Tolerance Patient tolerated treatment well             Past Medical History:  Diagnosis Date   Allergy    Anxiety    Arthritis    knees and spine, shoulder   Asthma    Bronchitis    hx - recurrent   Complication of anesthesia    waking up is not easy   Depression    Elevated IgE level 09/12/2017   09/12/2017 IgE 195   H/O miscarriage, not currently pregnant    Hx of irritable bowel syndrome    x2   Hyperlipidemia    diet controlled - no medication   Hypertension    not taking any meds at present - under control per patient   Hypokalemia    with PNA admission (2.5)   Ischemic cerebrovascular accident (CVA) ([place])    Migraines    Pneumonia    4 episodes; hosp. admission 2014   PONV (postoperative nausea and vomiting)    Thyroid disease    UC (ulcerative colitis) [facility])    DX'D 2021    Past Surgical History:  Procedure Laterality Date   BREAST SURGERY     implants, then had them removed   COLONOSCOPY     greater 10 yrs ago - ? [facility]-2017 LAST   DILATION AND CURETTAGE OF UTERUS     IR ANGIO INTRA EXTRACRAN SEL INTERNAL CAROTID BILAT MOD SED  11/19/2020   IR ANGIO VERTEBRAL SEL VERTEBRAL BILAT MOD SED  11/19/2020   IR ANGIOGRAM FOLLOW UP STUDY  11/19/2020   IR ANGIOGRAM FOLLOW UP STUDY  11/19/2020   IR ANGIOGRAM FOLLOW UP STUDY   11/19/2020   IR ANGIOGRAM FOLLOW UP STUDY  11/19/2020   IR ANGIOGRAM FOLLOW UP STUDY  11/19/2020   IR ANGIOGRAM FOLLOW UP STUDY  11/19/2020   IR ANGIOGRAM FOLLOW UP STUDY  11/19/2020   IR ANGIOGRAM FOLLOW UP STUDY  11/19/2020   IR INTRA CRAN STENT  11/19/2020   IR TRANSCATH/EMBOLIZ  11/19/2020   LAPAROSCOPIC ABDOMINAL EXPLORATION  01/31/1992   endometriosis   ORIF HUMERUS FRACTURE Left 04/01/2013   DR Ninfa Linden - shoulder   ORIF HUMERUS FRACTURE Left 04/01/2013   Procedure: OPEN REDUCTION INTERNAL FIXATION (ORIF) LEFT PROXIMAL HUMERUS FRACTURE;  Surgeon: Mcarthur Rossetti, MD;  Location: [place];  Service: Orthopedics;  Laterality: Left;   RADIOLOGY WITH ANESTHESIA N/A 11/19/2020   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;  Surgeon: Consuella Lose, MD;  Location: [place];  Service: Radiology;  Laterality: N/A;   TONSILLECTOMY AND ADENOIDECTOMY      There were no vitals filed for this visit.   Subjective Assessment - 01/17/21 1507     Subjective Pt was in ED with CVA like symptoms (physical).  Currently in Pain? No/denies                   ADULT SLP TREATMENT - 01/18/21 0001       General Information   Behavior/Cognition Cooperative;Alert;Pleasant mood      Pain Assessment   Pain Assessment No/denies pain      Cognitive-Linquistic Treatment   Treatment focused on Aphasia;Patient/family/caregiver education;Cognition    Skilled Treatment "I've been headache free since Saturday afternoon and that's the longest I've gone since surgery." Pt spoke in conversation with SLP and described techniques (mod-max conversation) she's been using for memory and organization as well as her health difficulties the last week. Extended short term goal end dates due to pt only seen twice since eval.      Assessment / Recommendations / [place] with current plan of care      Progression Toward Goals   Progression toward goals Progressing toward goals               SLP Education - 01/18/21 0916     Education Details benefits of constant therapy    Person(s) Educated Patient    Methods Explanation    Comprehension Verbalized understanding              SLP Short Term Goals - 01/18/21 0913       SLP SHORT TERM GOAL #1   Title pt will tell SLP 4 practical compensatory measures she could use for memory in 2 sessions    Time 4    Period Weeks    Status On-going    Target Date 01/28/21   extended two weeks due to only seen pt 2 sesssions     SLP SHORT TERM GOAL #2   Title pt will write one paragraph detailed information/mod complex topic with modified independence (compensations) in 2 sessions    Time 4    Period Weeks    Status On-going    Target Date 01/28/21      SLP SHORT TERM GOAL #3   Title pt will functionally verbally describe mod complex information (sequences, technical data, etc) in 3 sessions    Time 4    Period Weeks    Status On-going    Target Date 01/28/21      SLP SHORT TERM GOAL #4   Title SLP to more formally assess pt's written language skills    Time 2    Period Weeks    Status Not Met   renewed for 01-28-21   Target Date 01/28/21              SLP Long Term Goals - 01/18/21 0915       SLP LONG TERM GOAL #1   Title pt will functionally verbally describe complex information (sequences, technical data, job responsibilities, etc) in 3 sessions    Time 9    Period Weeks    Status On-going    Target Date 02/25/21      SLP LONG TERM GOAL #2   Title pt will functionally describe her successful utilization of memory strategies and/or attention compensations in 2 sessions    Time 9    Period Weeks    Status On-going    Target Date 02/25/21      SLP LONG TERM GOAL #3   Title pt will generate two paragraphs written information (work-like, mod complex/complex information) without errors, after self correction, in 3 sessions    Time 9  Period Weeks    Status On-going    Target Date  02/25/21      SLP LONG TERM GOAL #4   Title pt will show independence with langauge enhancing treatments she can do at home (VNeST, SFA) in two sesssions    Time 9    Period Weeks    Status On-going    Target Date 02/25/21              Plan - 01/18/21 0916     Clinical Impression Statement Klani Caridi presents for only her second therapy session today with mild expressive aphasia following multiple CVAs in the last 5-6 weeks- assessed as mildly deficient in word finding/anomia in verbal expression, and reported as impairments in sentence construction in written expression. Pt was also seen in ED last week for symptoms of CVA - newe rt cerebellar infarct was ID'd. MDs unsure why pt cont to have these CVAs. Bloodwork is currently being done. Pt scored outside of the normal range with the [place]- 2nd Ed. Pt also endorses some deficits with memory and this affects her auditory comprehension  -example during evaluation today is that SLP asked pt to write a thank you note to someone who brought something to patietn at home after her d/c from hospital. Pt wrote the note in third person instead of TO the person who helped her. Lyrah has begun writing down many items so that she will remember them. SLP believes pt will benefit from skilled ST targeting expressive language (spoken and written) and some compensatory strategies for memory.    Speech Therapy Frequency 2x / week    Duration --   9 weeks   Treatment/Interventions Patient/family education;Internal/external aids;Cognitive reorganization;Compensatory strategies;SLP instruction and feedback;Cueing hierarchy;Language facilitation;Functional tasks;Environmental controls    Potential to Achieve Goals Good    Consulted and Agree with Plan of Care Patient             Patient will benefit from skilled therapeutic intervention in order to improve the following deficits and impairments:   Expressive aphasia  Cognitive  communication deficit    Problem List Patient Active Problem List   Diagnosis Date Noted   Acute CVA (cerebrovascular accident) ([place])    TIA (transient ischemic attack) 12/17/2020   Hypokalemia 12/17/2020   Moderate persistent asthma 12/17/2020   Hypertension    CVA (cerebral vascular accident) ([place]) 12/04/2020   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   Family history of heart disease 09/03/2019   Mild intermittent asthma 07/25/2019   Osteoporosis 06/12/2019   Elevated LFTs 06/06/2019   Hematuria 06/06/2019   Elevated IgE level 09/12/2017   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) 09/12/2017   Headache, migraine 02/15/2015   Vitamin D deficiency 10/19/2014   Encounter for preventive health examination 10/19/2014   Depression with anxiety 09/02/2014   Dyspnea on exertion 08/26/2014   Hyperlipidemia 08/18/2014   Hypothyroidism 08/18/2014   Obesity (BMI 30-39.9) 08/18/2014    [place], [place] 01/18/2021, 9:19 AM  [facility] [address] [place], [place], [address] Phone: [phone]   Fax:  [phone]   Name: Amberli Ruegg MRN: [id number] Date of Birth: [birth year]

## 2021-01-20 ENCOUNTER — Ambulatory Visit: Payer: BC Managed Care – PPO

## 2021-01-20 ENCOUNTER — Other Ambulatory Visit: Payer: Self-pay

## 2021-01-20 ENCOUNTER — Ambulatory Visit: Payer: BC Managed Care – PPO | Admitting: Physical Therapy

## 2021-01-20 ENCOUNTER — Encounter: Payer: Self-pay | Admitting: Physical Therapy

## 2021-01-20 DIAGNOSIS — R4701 Aphasia: Secondary | ICD-10-CM | POA: Diagnosis not present

## 2021-01-20 DIAGNOSIS — R41841 Cognitive communication deficit: Secondary | ICD-10-CM

## 2021-01-20 DIAGNOSIS — R42 Dizziness and giddiness: Secondary | ICD-10-CM

## 2021-01-20 DIAGNOSIS — R2681 Unsteadiness on feet: Secondary | ICD-10-CM

## 2021-01-20 NOTE — Patient Instructions (Signed)
Access Code: [email or web address] URL: [email or web address] Date: 01/20/2021 Prepared by: [facility]  Program Notes Walking:  Continue short, frequent walks during the day 10+ minutes to build endurance.    Exercises Romberg Stance with Eyes Closed - 2 x daily - 7 x weekly - 1 sets - 3 reps - 20-30 seconds hold Rolling right<>left sides for vestibular habituation - 2 x daily - 7 x weekly - 1 sets - 5 reps Sidelying Thoracic Rotation with Open Book - 1 x daily - 7 x weekly - 1 sets - 3-5 reps  Added 01/20/2021 Seated Left Head Turns Vestibular Habituation - 2-3 x daily - 7 x weekly - 2 sets - 5-10 reps

## 2021-01-20 NOTE — Patient Instructions (Signed)
°  Please complete the assigned speech therapy homework prior to your next session.  

## 2021-01-20 NOTE — Therapy (Signed)
[facility] [place] [place], [place], [place], [address] Phone: [phone]   Fax:  [phone]  Physical Therapy Treatment  Patient Details  Name: Tiffany Horn MRN: [id number] Date of Birth: Nov 19, 1960 Referring Provider (PT): Kathyrn Sheriff   Encounter Date: 01/20/2021   PT End of Session - 01/20/21 1537     Visit Number 4    Number of Visits 17    Date for PT Re-Evaluation 02/18/21    Authorization Type BCBS    PT Start Time 1507   Pt arrives late, agrees to shortened session   PT Stop Time 1531    PT Time Calculation (min) 24 min    Activity Tolerance Patient tolerated treatment well   headache comes on with sit>L sidelying>sit edge of mat   Behavior During Therapy [facility] for tasks assessed/performed             Past Medical History:  Diagnosis Date   Allergy    Anxiety    Arthritis    knees and spine, shoulder   Asthma    Bronchitis    hx - recurrent   Complication of anesthesia    waking up is not easy   Depression    Elevated IgE level 09/12/2017   09/12/2017 IgE 195   H/O miscarriage, not currently pregnant    Hx of irritable bowel syndrome    x2   Hyperlipidemia    diet controlled - no medication   Hypertension    not taking any meds at present - under control per patient   Hypokalemia    with PNA admission (2.5)   Ischemic cerebrovascular accident (CVA) ([place])    Migraines    Pneumonia    4 episodes; hosp. admission 2014   PONV (postoperative nausea and vomiting)    Thyroid disease    UC (ulcerative colitis) [facility])    DX'D 2021    Past Surgical History:  Procedure Laterality Date   BREAST SURGERY     implants, then had them removed   COLONOSCOPY     greater 10 yrs ago - ? [facility]-2017 LAST   DILATION AND CURETTAGE OF UTERUS     IR ANGIO INTRA EXTRACRAN SEL INTERNAL CAROTID BILAT MOD SED  11/19/2020   IR ANGIO VERTEBRAL SEL VERTEBRAL BILAT MOD SED  11/19/2020   IR ANGIOGRAM FOLLOW UP  STUDY  11/19/2020   IR ANGIOGRAM FOLLOW UP STUDY  11/19/2020   IR ANGIOGRAM FOLLOW UP STUDY  11/19/2020   IR ANGIOGRAM FOLLOW UP STUDY  11/19/2020   IR ANGIOGRAM FOLLOW UP STUDY  11/19/2020   IR ANGIOGRAM FOLLOW UP STUDY  11/19/2020   IR ANGIOGRAM FOLLOW UP STUDY  11/19/2020   IR ANGIOGRAM FOLLOW UP STUDY  11/19/2020   IR INTRA CRAN STENT  11/19/2020   IR TRANSCATH/EMBOLIZ  11/19/2020   LAPAROSCOPIC ABDOMINAL EXPLORATION  01/31/1992   endometriosis   ORIF HUMERUS FRACTURE Left 04/01/2013   DR Ninfa Linden - shoulder   ORIF HUMERUS FRACTURE Left 04/01/2013   Procedure: OPEN REDUCTION INTERNAL FIXATION (ORIF) LEFT PROXIMAL HUMERUS FRACTURE;  Surgeon: Mcarthur Rossetti, MD;  Location: [place];  Service: Orthopedics;  Laterality: Left;   RADIOLOGY WITH ANESTHESIA N/A 11/19/2020   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;  Surgeon: Consuella Lose, MD;  Location: [place];  Service: Radiology;  Laterality: N/A;   TONSILLECTOMY AND ADENOIDECTOMY      There were no vitals filed for this visit.   Subjective Assessment -  01/20/21 1509     Subjective Having a headache behind my eyes today-been very headachey and stumbling.  Just very stressed, but I plan to make changes.  Had an episode the other day where I turned quickly with the car and went up on the sidewalk.  It all happens to the left.    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  ED/hospital 11/5-11/7/22; ED 12/16/20 with blurred vision and L sided weakness; imaging shows evolution of R PCA infarct and subacute infarct R cerebellum)    Patient Stated Goals Pt's goals for therapy are to build strength in L side, work on balance.    Currently in Pain? Yes    Pain Score 8     Pain Location Head    Pain Orientation Mid    Pain Descriptors / Indicators Aching    Pain Frequency Intermittent     Aggravating Factors  stress    Pain Relieving Factors rest                                Vestibular Treatment/Exercise - 01/20/21 0001       Vestibular Treatment/Exercise   Gaze Exercises X1 Viewing Horizontal;X1 Viewing Vertical      X1 Viewing Horizontal   Foot Position seated    Reps 5    Comments only blurriness, no swimmy headedness      X1 Viewing Vertical   Foot Position seated    Reps 5    Comments only blurriness, no swimmy headedness             In sitting position, head turns and eye motions side to side, 5 reps.  Symptoms rated as 2/10  Performed 2 sets, 2nd set symptoms rated as 3-4/10.  Cues to look ahead at target with head stationary/eyes open between sets.  Brief attempt at sit>sidesit propped on L elbow x 3 reps, no report of increase in symptoms.       PT Education - 01/20/21 1536     Education Details Addition to HEP (also trialed sit>sidesit propped on L elbow x 3 reps and pt may try this at home she reports).  Discussed driving longer distances over the holiday weekend (interstate driving, >4.0 hrs at night) and PT recommends pt does not perform these driving tasks due to pt's hx in therapy with light sensitivity, rapid onset of symptoms resulting in dizziness and significant headaches.  Pt verbalizes understanding.    Person(s) Educated Patient    Methods Explanation;Demonstration;Handout    Comprehension Verbalized understanding;Returned demonstration              PT Short Term Goals - 01/20/21 1538       PT SHORT TERM GOAL #1   Title Pt will be independent with HEP for improved strength, balance, and gait.  TARGET 01/21/2021    Baseline per report, pt is performing horizontal roll and open book stretch 01/20/21    Time 4    Period Weeks    Status Achieved      PT SHORT TERM GOAL #2   Title Pt will rate dizziness/unsteadiness as less than or equal to 4/10 with functional gait activities.    Baseline 7/10 with  FGA; variable reports during sessions, 5-8/10    Time 4    Period Weeks    Status On-going  PT Long Term Goals - 01/20/21 1542       PT LONG TERM GOAL #1   Title Pt will be independent with HEP for improved strength, balance, and gait.  TARGET 02/18/2021    Time 8    Period Weeks    Status On-going      PT LONG TERM GOAL #2   Title Pt will improve FGA to at least 25/30 for decreased fall risk.    Baseline 22/30    Time 8    Period Weeks    Status On-going      PT LONG TERM GOAL #3   Title Pt will rate dizziness/unsteadiness symtpoms as less than or equal to 2/10 with functional gait activiteis.    Time 8    Period Weeks    Status On-going      PT LONG TERM GOAL #4   Title Pt will verbalize understanding of fall prevention in home environment.    Time 8    Period Weeks    Status On-going                   Plan - 01/20/21 1538     Clinical Impression Statement Pt agrees to shortened PT session today, due to arriving late.  STGs briefly assessed, with pt meeting 1 of 2 STGs.  Pt is performing HEP initiated several visits ago, and additional exercise provided today to add to HEP to address habituation to the left side.  Pt is able to perform head motions and gentle sit>side sit propped at forearms with minimal increase in symptoms.  Continued to reinforce education on habituation and gradual return to activity levels.    PT Treatment/Interventions ADLs/Self Care Home Management;Gait training;Functional mobility training;Stair training;Therapeutic activities;Therapeutic exercise;Balance training;Neuromuscular re-education;Patient/family education;Vestibular    PT Next Visit Plan check HEP additions and progress habituation (try sit>sidelyign with pillows/propped on elbow OR seated head motions).    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   Family history of heart disease 09/03/2019   Mild intermittent asthma 07/25/2019   Osteoporosis 06/12/2019   Elevated LFTs 06/06/2019   Hematuria 06/06/2019   Elevated IgE level 09/12/2017   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) 09/12/2017   Headache, migraine 02/15/2015   Vitamin D deficiency 10/19/2014   Encounter for preventive health examination 10/19/2014   Depression with anxiety 09/02/2014   Dyspnea on exertion 08/26/2014   Hyperlipidemia 08/18/2014   Hypothyroidism 08/18/2014   Obesity (BMI 30-39.9) 08/18/2014    Jerusalem Wert W., PT 01/20/2021, 3:44 PM  [facility] [address], [place], [place], [address] Phone: [phone]   Fax:  [phone]  Name: Tiffany Horn MRN: [id number] Date of Birth: 1960-09-16

## 2021-01-20 NOTE — Therapy (Signed)
[facility] [place], [place], [place], [address] Phone: [phone]   Fax:  [phone]  Speech Language Pathology Treatment  Patient Details  Name: Tiffany Horn MRN: [id number] Date of Birth: September 15, 1960 Referring Provider (SLP): Wendee Beavers MD (referring), Howard Pouch A NP (documentation)   Encounter Date: 01/20/2021   End of Session - 01/20/21 1652     Visit Number 4    Number of Visits 19    Date for SLP Re-Evaluation 02/25/21   added one week due to holiday   SLP Start Time 1450    SLP Stop Time  1535    SLP Time Calculation (min) 45 min    Activity Tolerance Patient tolerated treatment well             Past Medical History:  Diagnosis Date   Allergy    Anxiety    Arthritis    knees and spine, shoulder   Asthma    Bronchitis    hx - recurrent   Complication of anesthesia    waking up is not easy   Depression    Elevated IgE level 09/12/2017   09/12/2017 IgE 195   H/O miscarriage, not currently pregnant    Hx of irritable bowel syndrome    x2   Hyperlipidemia    diet controlled - no medication   Hypertension    not taking any meds at present - under control per patient   Hypokalemia    with PNA admission (2.5)   Ischemic cerebrovascular accident (CVA) ([place])    Migraines    Pneumonia    4 episodes; hosp. admission 2014   PONV (postoperative nausea and vomiting)    Thyroid disease    UC (ulcerative colitis) [facility])    DX'D 2021    Past Surgical History:  Procedure Laterality Date   BREAST SURGERY     implants, then had them removed   COLONOSCOPY     greater 10 yrs ago - ? [facility]-2017 LAST   DILATION AND CURETTAGE OF UTERUS     IR ANGIO INTRA EXTRACRAN SEL INTERNAL CAROTID BILAT MOD SED  11/19/2020   IR ANGIO VERTEBRAL SEL VERTEBRAL BILAT MOD SED  11/19/2020   IR ANGIOGRAM FOLLOW UP STUDY  11/19/2020   IR ANGIOGRAM FOLLOW UP STUDY  11/19/2020   IR ANGIOGRAM FOLLOW UP STUDY   11/19/2020   IR ANGIOGRAM FOLLOW UP STUDY  11/19/2020   IR ANGIOGRAM FOLLOW UP STUDY  11/19/2020   IR ANGIOGRAM FOLLOW UP STUDY  11/19/2020   IR ANGIOGRAM FOLLOW UP STUDY  11/19/2020   IR ANGIOGRAM FOLLOW UP STUDY  11/19/2020   IR INTRA CRAN STENT  11/19/2020   IR TRANSCATH/EMBOLIZ  11/19/2020   LAPAROSCOPIC ABDOMINAL EXPLORATION  01/31/1992   endometriosis   ORIF HUMERUS FRACTURE Left 04/01/2013   DR Ninfa Linden - shoulder   ORIF HUMERUS FRACTURE Left 04/01/2013   Procedure: OPEN REDUCTION INTERNAL FIXATION (ORIF) LEFT PROXIMAL HUMERUS FRACTURE;  Surgeon: Mcarthur Rossetti, MD;  Location: [place];  Service: Orthopedics;  Laterality: Left;   RADIOLOGY WITH ANESTHESIA N/A 11/19/2020   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;  Surgeon: Consuella Lose, MD;  Location: [place];  Service: Radiology;  Laterality: N/A;   TONSILLECTOMY AND ADENOIDECTOMY      There were no vitals filed for this visit.          ADULT SLP TREATMENT - 01/20/21 1543       General Information  Behavior/Cognition Cooperative;Alert;Pleasant mood      Pain Assessment   Pain Assessment No/denies pain      Cognitive-Linquistic Treatment   Treatment focused on Aphasia;Patient/family/caregiver education;Cognition    Skilled Treatment Pt described 4 compensations she is using to augment her memory mostly revolving around "writing it down". Today pt listened to mod complex selection, took notes in order to provide verbal summary for SLP. Pt's summary was adequate, but she did not recall 1-2 details SLP thought she should have recalled. SLP encouraged pt to listen to podcast/auditory information and provide succinct summaries for each episode. Pt expressed she was frustrated with the "closed door" for the reason she has been having strokes. Pt indicated that she does not think basis is stress. SLP suggested Tiffany Horn consider obtaining a second opinion if she is frustrated with the "closed door" feeling  she has.      Assessment / Recommendations / Plan   Plan Continue with current plan of care      Progression Toward Goals   Progression toward goals Progressing toward goals                SLP Short Term Goals - 01/20/21 1715       SLP SHORT TERM GOAL #1   Title pt will tell SLP 4 practical compensatory measures she could use for memory in 2 sessions    Baseline 01-20-21    Time 4    Period Weeks    Status On-going    Target Date 01/28/21   extended two weeks due to only seen pt 2 sesssions     SLP SHORT TERM GOAL #2   Title pt will write one paragraph detailed information/mod complex topic with modified independence (compensations) in 2 sessions    Time 4    Period Weeks    Status On-going    Target Date 01/28/21      SLP SHORT TERM GOAL #3   Title pt will functionally verbally describe mod complex information (sequences, technical data, etc) in 3 sessions    Baseline 01-20-21    Time 4    Period Weeks    Status On-going    Target Date 01/28/21      SLP SHORT TERM GOAL #4   Title SLP to more formally assess pt's written language skills    Time 2    Period Weeks    Status Not Met   renewed for 01-28-21   Target Date 01/28/21              SLP Long Term Goals - 01/20/21 1715       SLP LONG TERM GOAL #1   Title pt will functionally verbally describe complex information (sequences, technical data, job responsibilities, etc) in 3 sessions    Time 9    Period Weeks    Status On-going    Target Date 02/25/21      SLP LONG TERM GOAL #2   Title pt will functionally describe her successful utilization of memory strategies and/or attention compensations in 2 sessions    Time 9    Period Weeks    Status On-going    Target Date 02/25/21      SLP LONG TERM GOAL #3   Title pt will generate two paragraphs written information (work-like, mod complex/complex information) without errors, after self correction, in 3 sessions    Time 9    Period Weeks    Status  On-going    Target Date 02/25/21  SLP LONG TERM GOAL #4   Title pt will show independence with langauge enhancing treatments she can do at home (VNeST, SFA) in two sesssions    Time 9    Period Weeks    Status On-going    Target Date 02/25/21              Plan - 01/20/21 1652     Clinical Impression Statement Tiffany Horn presents with mild expressive aphasia following multiple CVAs in the last 5-6 weeks- assessed as mildly deficient in word finding/anomia in verbal expression, and reported as impairments in sentence construction in written expression. New rt cerebellar infarct was ID'd last week in ED. MDs unsure why pt cont to have these CVAs. Bloodwork is currently being done. Pt scored outside of the normal range with the [facility]- 2nd Ed. Pt also endorses some deficits with memory and this affects her auditory comprehension  -example during evaluation today is that SLP asked pt to write a thank you note to someone who brought something to patietn at home after her d/c from hospital. Pt wrote the note in third person instead of TO the person who helped her. Tiffany Horn has begun writing down many items so that she will remember them. SLP believes pt will benefit from skilled ST targeting expressive language (spoken and written) and some compensatory strategies for memory.    Speech Therapy Frequency 2x / week    Duration --   9 weeks   Treatment/Interventions Patient/family education;Internal/external aids;Cognitive reorganization;Compensatory strategies;SLP instruction and feedback;Cueing hierarchy;Language facilitation;Functional tasks;Environmental controls    Potential to Achieve Goals Good    Consulted and Agree with Plan of Care Patient             Patient will benefit from skilled therapeutic intervention in order to improve the following deficits and impairments:   Expressive aphasia  Cognitive communication deficit    Problem List Patient Active Problem  List   Diagnosis Date Noted   Acute CVA (cerebrovascular accident) ([place])    TIA (transient ischemic attack) 12/17/2020   Hypokalemia 12/17/2020   Moderate persistent asthma 12/17/2020   Hypertension    CVA (cerebral vascular accident) ([place]) 12/04/2020   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   Family history of heart disease 09/03/2019   Mild intermittent asthma 07/25/2019   Osteoporosis 06/12/2019   Elevated LFTs 06/06/2019   Hematuria 06/06/2019   Elevated IgE level 09/12/2017   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) 09/12/2017   Headache, migraine 02/15/2015   Vitamin D deficiency 10/19/2014   Encounter for preventive health examination 10/19/2014   Depression with anxiety 09/02/2014   Dyspnea on exertion 08/26/2014   Hyperlipidemia 08/18/2014   Hypothyroidism 08/18/2014   Obesity (BMI 30-39.9) 08/18/2014    [place], [place] 01/20/2021, 5:16 PM  [facility] [address] [place], [place], [address] Phone: [phone]   Fax:  [phone]   Name: Tiffany Horn MRN: [id number] Date of Birth: 11/30/1960

## 2021-01-25 ENCOUNTER — Ambulatory Visit: Payer: BC Managed Care – PPO | Admitting: Rehabilitative and Restorative Service Providers"

## 2021-01-27 ENCOUNTER — Ambulatory Visit: Payer: BC Managed Care – PPO

## 2021-01-27 ENCOUNTER — Ambulatory Visit: Payer: BC Managed Care – PPO | Admitting: Rehabilitative and Restorative Service Providers"

## 2021-02-01 ENCOUNTER — Encounter: Payer: Self-pay | Admitting: Physical Therapy

## 2021-02-01 ENCOUNTER — Ambulatory Visit: Payer: BC Managed Care – PPO | Attending: Neurosurgery | Admitting: Physical Therapy

## 2021-02-01 ENCOUNTER — Ambulatory Visit: Payer: BC Managed Care – PPO

## 2021-02-01 ENCOUNTER — Other Ambulatory Visit: Payer: Self-pay

## 2021-02-01 DIAGNOSIS — R4701 Aphasia: Secondary | ICD-10-CM

## 2021-02-01 DIAGNOSIS — R41841 Cognitive communication deficit: Secondary | ICD-10-CM

## 2021-02-01 DIAGNOSIS — R42 Dizziness and giddiness: Secondary | ICD-10-CM | POA: Insufficient documentation

## 2021-02-01 DIAGNOSIS — R2681 Unsteadiness on feet: Secondary | ICD-10-CM | POA: Diagnosis present

## 2021-02-01 DIAGNOSIS — R2689 Other abnormalities of gait and mobility: Secondary | ICD-10-CM | POA: Diagnosis present

## 2021-02-01 NOTE — Therapy (Signed)
[facility] [place], [place], [place], [address] Phone: [phone]   Fax:  [phone]  Speech Language Pathology Treatment  Patient Details  Name: Tiffany Horn MRN: [id number] Date of Birth: Feb 13, 1960 Referring Provider (SLP): Wendee Beavers MD (referring), Howard Pouch A NP (documentation)   Encounter Date: 02/01/2021   End of Session - 02/01/21 2338     Visit Number 5    Number of Visits 19    Date for SLP Re-Evaluation 02/25/21   added one week due to holiday   SLP Start Time 1450    SLP Stop Time  6945    SLP Time Calculation (min) 40 min    Activity Tolerance Patient tolerated treatment well             Past Medical History:  Diagnosis Date   Allergy    Anxiety    Arthritis    knees and spine, shoulder   Asthma    Bronchitis    hx - recurrent   Complication of anesthesia    waking up is not easy   Depression    Elevated IgE level 09/12/2017   09/12/2017 IgE 195   H/O miscarriage, not currently pregnant    Hx of irritable bowel syndrome    x2   Hyperlipidemia    diet controlled - no medication   Hypertension    not taking any meds at present - under control per patient   Hypokalemia    with PNA admission (2.5)   Ischemic cerebrovascular accident (CVA) ([place])    Migraines    Pneumonia    4 episodes; hosp. admission 2014   PONV (postoperative nausea and vomiting)    Thyroid disease    UC (ulcerative colitis) [facility])    DX'D 2021    Past Surgical History:  Procedure Laterality Date   BREAST SURGERY     implants, then had them removed   COLONOSCOPY     greater 10 yrs ago - ? [facility]-2017 LAST   DILATION AND CURETTAGE OF UTERUS     IR ANGIO INTRA EXTRACRAN SEL INTERNAL CAROTID BILAT MOD SED  11/19/2020   IR ANGIO VERTEBRAL SEL VERTEBRAL BILAT MOD SED  11/19/2020   IR ANGIOGRAM FOLLOW UP STUDY  11/19/2020   IR ANGIOGRAM FOLLOW UP STUDY  11/19/2020   IR ANGIOGRAM FOLLOW UP STUDY   11/19/2020   IR ANGIOGRAM FOLLOW UP STUDY  11/19/2020   IR ANGIOGRAM FOLLOW UP STUDY  11/19/2020   IR ANGIOGRAM FOLLOW UP STUDY  11/19/2020   IR ANGIOGRAM FOLLOW UP STUDY  11/19/2020   IR ANGIOGRAM FOLLOW UP STUDY  11/19/2020   IR INTRA CRAN STENT  11/19/2020   IR TRANSCATH/EMBOLIZ  11/19/2020   LAPAROSCOPIC ABDOMINAL EXPLORATION  01/31/1992   endometriosis   ORIF HUMERUS FRACTURE Left 04/01/2013   DR Ninfa Linden - shoulder   ORIF HUMERUS FRACTURE Left 04/01/2013   Procedure: OPEN REDUCTION INTERNAL FIXATION (ORIF) LEFT PROXIMAL HUMERUS FRACTURE;  Surgeon: Mcarthur Rossetti, MD;  Location: [place];  Service: Orthopedics;  Laterality: Left;   RADIOLOGY WITH ANESTHESIA N/A 11/19/2020   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;  Surgeon: Consuella Lose, MD;  Location: [place];  Service: Radiology;  Laterality: N/A;   TONSILLECTOMY AND ADENOIDECTOMY      There were no vitals filed for this visit.   Subjective Assessment - 02/01/21 2336     Subjective "i haven't had any more issues with stroke symptoms since last  time!"    Currently in Pain? No/denies                   ADULT SLP TREATMENT - 02/01/21 1459       General Information   Behavior/Cognition Cooperative;Alert;Pleasant mood      Pain Assessment   Pain Assessment No/denies pain      Cognitive-Linquistic Treatment   Treatment focused on Aphasia;Patient/family/caregiver education;Cognition    Skilled Treatment SLP worked with pt with written compensations for auditory comprehension so pt can write a succinct summary of >7 minute auditory message. Pt's homework to write four summaries of 6 minute or less auditory infomation.      Assessment / Recommendations / Plan   Plan Continue with current plan of care      Progression Toward Goals   Progression toward goals Progressing toward goals                SLP Short Term Goals - 02/01/21 2340       SLP SHORT TERM GOAL #1   Title pt will  tell SLP 4 practical compensatory measures she could use for memory in 2 sessions    Baseline 01-20-21    Status Achieved    Target Date 01/28/21   extended two weeks due to only seen pt 2 sesssions     SLP SHORT TERM GOAL #2   Title pt will write one paragraph detailed information/mod complex topic with modified independence (compensations) in 2 sessions    Status Not Met    Target Date 01/28/21      SLP SHORT TERM GOAL #3   Title pt will functionally verbally describe mod complex information (sequences, technical data, etc) in 3 sessions    Baseline 01-20-21, 01-31-21    Status Partially Met    Target Date 01/28/21      SLP SHORT TERM GOAL #4   Title SLP to more formally assess pt's written language skills    Time 2    Period Weeks    Status Not Met   renewed for 01-28-21   Target Date 01/28/21              SLP Long Term Goals - 02/01/21 2341       SLP LONG TERM GOAL #1   Title pt will functionally verbally describe complex information (sequences, technical data, job responsibilities, etc) in 3 sessions    Time 9    Period Weeks    Status On-going    Target Date 02/25/21      SLP LONG TERM GOAL #2   Title pt will functionally describe her successful utilization of memory strategies and/or attention compensations in 2 sessions    Time 9    Period Weeks    Status On-going    Target Date 02/25/21      SLP LONG TERM GOAL #3   Title pt will generate two paragraphs written information (work-like, mod complex/complex information) without errors, after self correction, in 3 sessions    Time 9    Period Weeks    Status On-going    Target Date 02/25/21      SLP LONG TERM GOAL #4   Title pt will show independence with langauge enhancing treatments she can do at home (VNeST, SFA) in two sesssions    Time 9    Period Weeks    Status On-going    Target Date 02/25/21  Plan - 02/01/21 2339     Clinical Impression Statement [name] presents with  mild expressive aphasia following multiple CVAs in the last ~6 weeks- assessed as mildly deficient in word finding/anomia in verbal expression, and reported as impairments in sentence construction in written expression. New rt cerebellar infarct was ID'd last week in ED. MDs unsure why pt cont to have these CVAs. Bloodwork is currently being done. Pt scored outside of the normal range with the [facility]- 2nd Ed. Pt also endorses some deficits with memory and this affects her auditory comprehension  Zi continues writing down many items so that she will remember them - today she again wrote her homework down. SLP believes pt will benefit from skilled ST targeting expressive language (spoken and written) and some compensatory strategies for memory.    Speech Therapy Frequency 2x / week    Duration --   9 weeks   Treatment/Interventions Patient/family education;Internal/external aids;Cognitive reorganization;Compensatory strategies;SLP instruction and feedback;Cueing hierarchy;Language facilitation;Functional tasks;Environmental controls    Potential to Achieve Goals Good    Consulted and Agree with Plan of Care Patient             Patient will benefit from skilled therapeutic intervention in order to improve the following deficits and impairments:   Expressive aphasia  Cognitive communication deficit    Problem List Patient Active Problem List   Diagnosis Date Noted   Acute CVA (cerebrovascular accident) ([place])    TIA (transient ischemic attack) 12/17/2020   Hypokalemia 12/17/2020   Moderate persistent asthma 12/17/2020   Hypertension    CVA (cerebral vascular accident) ([place]) 12/04/2020   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   Family history of heart disease 09/03/2019   Mild intermittent asthma 07/25/2019   Osteoporosis 06/12/2019   Elevated LFTs 06/06/2019   Hematuria 06/06/2019   Elevated IgE level 09/12/2017   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) 09/12/2017    Headache, migraine 02/15/2015   Vitamin D deficiency 10/19/2014   Encounter for preventive health examination 10/19/2014   Depression with anxiety 09/02/2014   Dyspnea on exertion 08/26/2014   Hyperlipidemia 08/18/2014   Hypothyroidism 08/18/2014   Obesity (BMI 30-39.9) 08/18/2014    [place], [place] 02/01/2021, 11:42 PM  [facility] [address], [place], [place], [address] Phone: [phone]   Fax:  [phone]   Name: Charna Neeb MRN: [id number] Date of Birth: [birth year]

## 2021-02-01 NOTE — Patient Instructions (Signed)
  Please complete the assigned speech therapy homework prior to your next session and return it to the speech therapist at your next visit.  

## 2021-02-01 NOTE — Therapy (Signed)
[facility] [address], [place], [place], [address] Phone: [phone]   Fax:  [phone]  Physical Therapy Treatment  Patient Details  Name: Tiffany Horn MRN: [id number] Date of Birth: 1960-08-18 Referring Provider (PT): Kathyrn Sheriff   Encounter Date: 02/01/2021   PT End of Session - 02/01/21 1615     Visit Number 5    Number of Visits 17    Date for PT Re-Evaluation 02/18/21    Authorization Type BCBS    PT Start Time 1533    PT Stop Time 1611    PT Time Calculation (min) 38 min    Equipment Utilized During Treatment Gait belt    Activity Tolerance Patient tolerated treatment well   headache comes on with sit>L sidelying>sit edge of mat   Behavior During Therapy [facility] for tasks assessed/performed             Past Medical History:  Diagnosis Date   Allergy    Anxiety    Arthritis    knees and spine, shoulder   Asthma    Bronchitis    hx - recurrent   Complication of anesthesia    waking up is not easy   Depression    Elevated IgE level 09/12/2017   09/12/2017 IgE 195   H/O miscarriage, not currently pregnant    Hx of irritable bowel syndrome    x2   Hyperlipidemia    diet controlled - no medication   Hypertension    not taking any meds at present - under control per patient   Hypokalemia    with PNA admission (2.5)   Ischemic cerebrovascular accident (CVA) ([place])    Migraines    Pneumonia    4 episodes; hosp. admission 2014   PONV (postoperative nausea and vomiting)    Thyroid disease    UC (ulcerative colitis) [facility])    DX'D 2021    Past Surgical History:  Procedure Laterality Date   BREAST SURGERY     implants, then had them removed   COLONOSCOPY     greater 10 yrs ago - ? [facility]-2017 LAST   DILATION AND CURETTAGE OF UTERUS     IR ANGIO INTRA EXTRACRAN SEL INTERNAL CAROTID BILAT MOD SED  11/19/2020   IR ANGIO VERTEBRAL SEL VERTEBRAL BILAT MOD SED  11/19/2020   IR ANGIOGRAM FOLLOW UP  STUDY  11/19/2020   IR ANGIOGRAM FOLLOW UP STUDY  11/19/2020   IR ANGIOGRAM FOLLOW UP STUDY  11/19/2020   IR ANGIOGRAM FOLLOW UP STUDY  11/19/2020   IR ANGIOGRAM FOLLOW UP STUDY  11/19/2020   IR ANGIOGRAM FOLLOW UP STUDY  11/19/2020   IR ANGIOGRAM FOLLOW UP STUDY  11/19/2020   IR ANGIOGRAM FOLLOW UP STUDY  11/19/2020   IR INTRA CRAN STENT  11/19/2020   IR TRANSCATH/EMBOLIZ  11/19/2020   LAPAROSCOPIC ABDOMINAL EXPLORATION  01/31/1992   endometriosis   ORIF HUMERUS FRACTURE Left 04/01/2013   DR Ninfa Linden - shoulder   ORIF HUMERUS FRACTURE Left 04/01/2013   Procedure: OPEN REDUCTION INTERNAL FIXATION (ORIF) LEFT PROXIMAL HUMERUS FRACTURE;  Surgeon: Mcarthur Rossetti, MD;  Location: [place];  Service: Orthopedics;  Laterality: Left;   RADIOLOGY WITH ANESTHESIA N/A 11/19/2020   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;  Surgeon: Consuella Lose, MD;  Location: [facility];  Service: Radiology;  Laterality: N/A;   TONSILLECTOMY AND ADENOIDECTOMY      There were no vitals filed for this visit.   Subjective Assessment -  02/01/21 1534     Subjective Was supposed to be in PT for the past 2 sessions but she got COVID and was not able to do her HEP.    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  ED/hospital 11/5-11/7/22; ED 12/16/20 with blurred vision and L sided weakness; imaging shows evolution of R PCA infarct and subacute infarct R cerebellum)    Patient Stated Goals Pt's goals for therapy are to build strength in L side, work on balance.    Currently in Pain? No/denies                                Vestibular Treatment/Exercise - 02/01/21 0001       Vestibular Treatment/Exercise   Vestibular Treatment Provided Habituation    Habituation Exercises Comment;Brandt Daroff   sitting R/L head turns x10- no dizziness; R/L opem book  stretch 5x each- c/o 2/10 dizziness     Nestor Lewandowsky   Number of Reps  3    Symptom Description  EO; 4-5/10 dizziness      Horizontal Roll   Number of Reps  4    Symptom Description  3/10 dizziness; cues to increase speed      X1 Viewing Horizontal   Foot Position sitting    Reps 10    Comments mild blurriness      X1 Viewing Vertical   Foot Position sitting    Reps 10    Comments moderate blurriness                Balance Exercises - 02/01/21 1542       Balance Exercises: Standing   Standing Eyes Opened Foam/compliant surface;Wide (BOA);1 rep;30 secs    Standing Eyes Closed Narrow base of support (BOS);Solid surface;Foam/compliant surface;30 secs;2 reps   EO/EC               PT Education - 02/01/21 1614     Education Details update to HEP-access code: [email or web address]; Educated patient on intended level of dizziness and how to adjust severity of symptoms.    Person(s) Educated Patient    Methods Explanation;Demonstration;Tactile cues;Verbal cues;Handout    Comprehension Verbalized understanding;Returned demonstration              PT Short Term Goals - 01/20/21 1538       PT SHORT TERM GOAL #1   Title Pt will be independent with HEP for improved strength, balance, and gait.  TARGET 01/21/2021    Baseline per report, pt is performing horizontal roll and open book stretch 01/20/21    Time 4    Period Weeks    Status Achieved      PT SHORT TERM GOAL #2   Title Pt will rate dizziness/unsteadiness as less than or equal to 4/10 with functional gait activities.    Baseline 7/10 with FGA; variable reports during sessions, 5-8/10    Time 4    Period Weeks    Status On-going               PT Long Term Goals - 01/20/21 1542       PT LONG TERM GOAL #1   Title Pt will be independent with HEP for improved strength, balance, and gait.  TARGET 02/18/2021    Time 8    Period Weeks    Status On-going  PT LONG TERM GOAL #2   Title Pt will improve FGA  to at least 25/30 for decreased fall risk.    Baseline 22/30    Time 8    Period Weeks    Status On-going      PT LONG TERM GOAL #3   Title Pt will rate dizziness/unsteadiness symtpoms as less than or equal to 2/10 with functional gait activiteis.    Time 8    Period Weeks    Status On-going      PT LONG TERM GOAL #4   Title Pt will verbalize understanding of fall prevention in home environment.    Time 8    Period Weeks    Status On-going                   Plan - 02/01/21 1615     Clinical Impression Statement Patient arrived to session with report of having had COVID since last session, now feeling better. Reviewed HEP for max benefit and carryover. Patient was able to demonstrate good stability with Romberg on firm surface, thus progressed to foam surface with wide BOS. Worked on sitting VOR with patient reporting mild-moderate blurriness, worse with head nods. Rolling activities were still appropriate as patient still notes mild dizziness. Updated HEP with brandt daroff d/t patients c/o dizziness when sitting up from bed. Educated patient on intended level of dizziness and how to adjust severity of symptoms. Patient reported understanding of all edu provided today and without complaints at end of session.    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    PT Treatment/Interventions ADLs/Self Care Home Management;Gait training;Functional mobility training;Stair training;Therapeutic activities;Therapeutic exercise;Balance training;Neuromuscular re-education;Patient/family education;Vestibular    PT Next Visit Plan check HEP additions and progress habituation (try sit>sidelyign with pillows/propped on elbow OR seated head motions).    Dynamic gait, head motions, turns    Consulted and Agree with Plan of Care Patient             Patient will benefit from skilled therapeutic  intervention in order to improve the following deficits and impairments:  Abnormal gait, Difficulty walking, Dizziness, Decreased balance, Decreased mobility, Decreased strength  Visit Diagnosis: Unsteadiness on feet  Dizziness and giddiness  Other abnormalities of gait and mobility     Problem List Patient Active Problem List   Diagnosis Date Noted   Acute CVA (cerebrovascular accident) ([place])    TIA (transient ischemic attack) 12/17/2020   Hypokalemia 12/17/2020   Moderate persistent asthma 12/17/2020   Hypertension    CVA (cerebral vascular accident) ([place]) 12/04/2020   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[place]) 11/17/2020   Family history of heart disease 09/03/2019   Mild intermittent asthma 07/25/2019   Osteoporosis 06/12/2019   Elevated LFTs 06/06/2019   Hematuria 06/06/2019   Elevated IgE level 09/12/2017   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) 09/12/2017   Headache, migraine 02/15/2015   Vitamin D deficiency 10/19/2014   Encounter for preventive health examination 10/19/2014   Depression with anxiety 09/02/2014   Dyspnea on exertion 08/26/2014   Hyperlipidemia 08/18/2014   Hypothyroidism 08/18/2014   Obesity (BMI 30-39.9) 08/18/2014    Janene Harvey, PT, DPT 02/01/21 4:17 PM   [facility] [address] [place], [place], [address] Phone: [phone]   Fax:  [phone]  Name: Aubryn Spinola MRN: [id number] Date of Birth: 1961-01-15

## 2021-02-07 ENCOUNTER — Ambulatory Visit: Payer: BC Managed Care – PPO | Admitting: Physical Therapy

## 2021-02-10 ENCOUNTER — Encounter: Payer: Self-pay | Admitting: Neurology

## 2021-02-10 ENCOUNTER — Ambulatory Visit: Payer: BC Managed Care – PPO | Admitting: Neurology

## 2021-02-10 ENCOUNTER — Other Ambulatory Visit: Payer: Self-pay

## 2021-02-10 VITALS — BP 122/86 | HR 75 | Ht 66.0 in | Wt 175.4 lb

## 2021-02-10 DIAGNOSIS — G459 Transient cerebral ischemic attack, unspecified: Secondary | ICD-10-CM

## 2021-02-10 DIAGNOSIS — I725 Aneurysm of other precerebral arteries: Secondary | ICD-10-CM

## 2021-02-10 NOTE — Progress Notes (Signed)
[facility] [address] [place]. [place] [address] [phone]       OFFICE FOLLOW-UP NOTE  Tiffany Horn Date of Birth:  18-Dec-1960 Medical Record Number:  [id number]   HPI: Initial visit 01/10/2021 [name] is a 61 year old Caucasian lady seen today for initial office follow-up visit following hospital consultation in November 2022.  He has past medical history of hypertension, hyperlipidemia, asthma, bronchitis, anxiety and thyroid disease.  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 Kathyrn Sheriff on 11/19/2020.  She was seen in the ER 3 weeks later with headaches, dizziness and imbalance.  She was seen in [place], ER and had an MRI scan of the brain done on 12/02/2020 which showed small focus of acute infarct in the right thalamus and right occipital lobe with a right PCA territory coil mass near the tip of the basilar artery residual aneurysm filling noted.  CT angiogram of the brain done on 12/04/2020 showed similar findings.  Patient presented again to the hospital on 12/16/2020 with sudden onset of vertigo, blurred vision and left-sided weakness.  Repeat MRI at this time showed 2 additional infarcts which are punctate in the right cerebellum with expected evolutionary changes of the previously seen right thalamic and right occipital.  She had severe headache at this time as well.  CT angiogram of the head and neck showed focal high-grade stenosis of the right P2 after the end of the stent with diminutive reconstitution of flow in the remainder of the right PCA which is unchanged.  Stable stent assisted coil embolization of the basilar tip aneurysm.  Hemoglobin A1c was 5.4, LDL cholesterol 156 mg percent and 2D echo showed ejection fraction of 60 to 65%.  Lower extremity venous Dopplers 1 negative for DVT.  TCD bubble study was negative for right-to-left shunt.  Patient was continued on aspirin and Brilinta and Lipitor..30-day heart monitor was  ordered but so far has not been done.  Patient states she was doing quite well until this morning when she woke up with severe occipital headache as well as feeling dizzy, off balance and leaning to the left.  Since then the headache has subsided somewhat but not completely gone.  She still feeling nauseous and dizzy and had some blurred vision as well slightly off balance.  I advised her to go to the emergency room for further evaluation for stroke she appeared reluctant initially but then agreed.  I recommended she go by ambulance but patient declined.  She has presented outside time window for thrombolysis and clinical exam is not suggestive of LVO. Update 02/10/2021 : She returns for follow-up after last visit a month ago.  At last visit she had significant dizziness and feeling of off balance and leaning to the left and she was referred urgently by me to the emergency room where she had an MRI scan of the brain on 01/10/2021 which showed a few additional tiny punctate right cerebellar subacute foci of restricted diffusion which could not explain her clinical exam of some left-sided subjective weakness and leaning.  EEG was ordered but I cannot see the results.  Patient was loaded with Keppra and discharged home on that.  Hypercoagulable panel labs were sent and I have reviewed them and all of them were normal.  Patient states symptoms recently resolved within a day or 2 and she is done well since then she has had no further episodes of dizziness and leaning to the left.  She did not tolerate  Keppra well due to headache and dizziness and stopped it within a week.  She is on aspirin and Brilinta tolerating it well without bleeding or bruising.  She has an upcoming visit with Dr. Kathyrn Sheriff in a few weeks.  Her blood pressure is well controlled.  She has no new complaints. ROS:   14 system review of systems is positive for headache, , dizziness, blurred vision, weakness, incoordination, ataxia and all other  systems negative  PMH:  Past Medical History:  Diagnosis Date   Allergy    Anxiety    Arthritis    knees and spine, shoulder   Asthma    Bronchitis    hx - recurrent   Complication of anesthesia    waking up is not easy   Depression    Elevated IgE level 09/12/2017   09/12/2017 IgE 195   H/O miscarriage, not currently pregnant    Hx of irritable bowel syndrome    x2   Hyperlipidemia    diet controlled - no medication   Hypertension    not taking any meds at present - under control per patient   Hypokalemia    with PNA admission (2.5)   Ischemic cerebrovascular accident (CVA) ([place])    Migraines    Pneumonia    4 episodes; hosp. admission 2014   PONV (postoperative nausea and vomiting)    Thyroid disease    UC (ulcerative colitis) [facility])    DX'D 2021    Social History:  Social History   Socioeconomic History   Marital status: Divorced    Spouse name: Not on file   Number of children: 0   Years of education: Not on file   Highest education level: Not on file  Occupational History   Occupation: Control and instrumentation engineer  Tobacco Use   Smoking status: Never   Smokeless tobacco: Never  Vaping Use   Vaping Use: Never used  Substance and Sexual Activity   Alcohol use: Yes    Alcohol/week: 2.0 standard drinks    Types: 2 Glasses of wine per week    Comment: OCC / SOCIAL  MAYBE 2 A WEEK   Drug use: No   Sexual activity: Not Currently    Partners: Male    Birth control/protection: Post-menopausal  Other Topics Concern   Not on file  Social History Narrative   She is originally from [place]. She has traveled to [facility], [place], [place], [place], [place], [place], [place], [place], [place]. No international travel. She has dogs. No prior bird, mold, or recent hot tub exposure. She hasn't used her hot tub in 1.5 years. 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 Previously enjoyed gardening and playing tennis. Helps to care for her mother.   Social  Determinants of Health   Financial Resource Strain: Not on file  Food Insecurity: Not on file  Transportation Needs: Not on file  Physical Activity: Not on file  Stress: Not on file  Social Connections: Not on file  Intimate Partner Violence: Not on file    Medications:   Current Outpatient Medications on File Prior to Visit  Medication Sig Dispense Refill   amLODipine (NORVASC) 10 MG tablet Take 1 tablet (10 mg total) by mouth daily. 90 tablet 1   Ascorbic Acid (VITAMIN C) 1000 MG tablet Take 1,000 mg by mouth daily.     aspirin 81 MG chewable tablet Chew 1 tablet (81 mg total) by mouth daily. 30 tablet  3   atorvastatin (LIPITOR) 80 MG tablet Take 1 tablet (80 mg total) by mouth daily. 90 tablet 1   B Complex Vitamins (B COMPLEX PO) Take 1 tablet by mouth daily.     Budeson-Glycopyrrol-Formoterol (BREZTRI AEROSPHERE) 160-9-4.8 MCG/ACT AERO Inhale 2 puffs into the lungs in the morning and at bedtime. 5.9 g 1   clonazePAM (KLONOPIN) 0.5 MG tablet Take 1 tablet (0.5 mg total) by mouth 3 (three) times daily as needed for anxiety. 90 tablet 5   ezetimibe (ZETIA) 10 MG tablet Take 1 tablet (10 mg total) by mouth daily. 30 tablet 1   fluticasone furoate-vilanterol (BREO ELLIPTA) 100-25 MCG/ACT AEPB Inhale 1 puff into the lungs daily as needed.     levothyroxine (SYNTHROID) 112 MCG tablet Take 1 tablet (112 mcg total) by mouth daily. (Patient taking differently: Take 112 mcg by mouth at bedtime.) 30 tablet 0   LORazepam (ATIVAN) 0.5 MG tablet Take 0.5 mg by mouth daily as needed.     MAGNESIUM GLYCINATE PO Take 1 tablet by mouth at bedtime.     ondansetron (ZOFRAN) 4 MG tablet Take 1 tablet by mouth as needed.     PARoxetine (PAXIL) 40 MG tablet Take 1 tablet (40 mg total) by mouth daily. 90 tablet 1   rizatriptan (MAXALT) 5 MG tablet Take 1 tablet (5 mg total) by mouth daily as needed for migraine. 10 tablet 5   Spacer/Aero-Holding Chambers DEVI Use daily with inhaler 1 each 0   ticagrelor  (BRILINTA) 90 MG TABS tablet Take 1 tablet (90 mg total) by mouth 2 (two) times daily. 60 tablet 3   umeclidinium bromide (INCRUSE ELLIPTA) 62.5 MCG/ACT AEPB Inhale 1 puff into the lungs daily.     levETIRAcetam (KEPPRA) 500 MG tablet Take 1 tablet (500 mg total) by mouth 2 (two) times daily. (Patient not taking: Reported on 01/17/2021) 60 tablet 1   Current Facility-Administered Medications on File Prior to Visit  Medication Dose Route Frequency Provider Last Rate Last Admin   0.9 %  sodium chloride infusion  500 mL Intravenous Once Nandigam, Venia Minks, MD        Allergies:   Allergies  Allergen Reactions   Abilify [Aripiprazole] Nausea And Vomiting   Amoxicillin Diarrhea   Promethazine Hcl    Seroquel [Quetiapine Fumarate]     Unknown    Physical Exam General: well developed, well nourished middle-aged Caucasian lady, seated, in no evident distress Head: head normocephalic and atraumatic.  Neck: supple with no carotid or supraclavicular bruits Cardiovascular: regular rate and rhythm, no murmurs Musculoskeletal: no deformity mild tenderness in the left occipital region in the midline. Skin:  no rash/petichiae Vascular:  Normal pulses all extremities Vitals:   02/10/21 1522  BP: 122/86  Pulse: 75  SpO2: 95%   Neurologic Exam Mental Status: Awake and fully alert. Oriented to place and time. Recent and remote memory intact. Attention span, concentration and fund of knowledge appropriate. Mood and affect appropriate.  Cranial Nerves: Fundoscopic exam not done. Pupils equal, briskly reactive to light. Extraocular movements full without nystagmus. Visual fields full to confrontation. Hearing intact. Facial sensation intact. Face, tongue, palate moves normally and symmetrically.  Motor: Normal bulk and tone. Normal strength in all tested extremity muscles. Sensory.: intact to touch ,pinprick .position and vibratory sensation.  Coordination: Intact finger-to-nose and medial  coordination bilaterally. Gait and Station: Arises from chair without difficulty. Stance is normal. Gait demonstrates normal stride length and balance .  Unable to do tandem walking.  Reflexes: 1+ and symmetric. Toes downgoing.       ASSESSMENT: 61 year old Caucasian lady with sudden onset of severe occipital headache as well as subjective dizziness and leaning to the left side of unclear etiology which began this morning.Marland KitchenPossibilities include recurrent stroke versus worsening of old deficits which needs further evaluation.  History of basilar tip aneurysm treated with stent assisted coiling in October 2022 with perioperative small right cerebellar and thalamic infarcts in November 2022. Recent episode of dizziness with leaning to the left in December 2022 of unclear etiology repeat imaging showing tiny silent subacute right cerebellar infarcts which could not explain the symptoms.    PLAN:I had a long discussion the patient regarding her recent episode of dizziness and leaning to the left in December last year and went over results of imaging studies and answered questions.  Recommend she stay on aspirin and Brilinta following her basilar aneurysm Coiling and maintain aggressive risk factor modification strict control of hypertension with blood pressure goal below 130/90, lipids with LDL cholesterol goal below 70 mg percent and diabetes with hemoglobin A1c goal below 6.5%.  She will keep her scheduled follow-up appointment with Dr. Kathyrn Sheriff for her basilar tip aneurysm follow-up and return for follow-up in the future in 6 months or call earlier if necessary..Greater than 50% of time during this 30 minute  visit was spent on counseling,explanation of diagnosis, planning of further management, discussion with patient and family and coordination of care Antony Contras, MD Note: This document was prepared with digital dictation and possible smart phrase technology. Any transcriptional errors that result  from this process are unintentional

## 2021-02-10 NOTE — Patient Instructions (Signed)
I had a long discussion the patient regarding her recent episode of dizziness and leaning to the left in December last year and went over results of imaging studies and answered questions.  Recommend she stay on aspirin and Brilinta following her basilar aneurysm Coiling and maintain aggressive risk factor modification strict control of hypertension with blood pressure goal below 130/90, lipids with LDL cholesterol goal below 70 mg percent and diabetes with hemoglobin A1c goal below 6.5%.  She will keep her scheduled follow-up appointment with Dr. Kathyrn Sheriff for her basilar tip aneurysm follow-up and return for follow-up in the future in 6 months or call earlier if necessary.

## 2021-02-14 ENCOUNTER — Ambulatory Visit: Payer: BC Managed Care – PPO | Admitting: Physical Therapy

## 2021-02-15 ENCOUNTER — Other Ambulatory Visit: Payer: Self-pay

## 2021-02-15 ENCOUNTER — Ambulatory Visit: Payer: BC Managed Care – PPO | Admitting: Physical Therapy

## 2021-02-15 ENCOUNTER — Ambulatory Visit: Payer: BC Managed Care – PPO

## 2021-02-15 ENCOUNTER — Encounter: Payer: Self-pay | Admitting: Physical Therapy

## 2021-02-15 DIAGNOSIS — R2681 Unsteadiness on feet: Secondary | ICD-10-CM

## 2021-02-15 DIAGNOSIS — R42 Dizziness and giddiness: Secondary | ICD-10-CM

## 2021-02-15 DIAGNOSIS — R41841 Cognitive communication deficit: Secondary | ICD-10-CM

## 2021-02-15 DIAGNOSIS — R4701 Aphasia: Secondary | ICD-10-CM

## 2021-02-15 NOTE — Therapy (Signed)
[facility] [place], [name], [place], [address] Phone: [phone]   Fax:  [phone]  Physical Therapy Discharge Summary  Patient Details  Name: Tiffany Horn MRN: [id number] Date of Birth: [birth year] Referring Provider (PT): Kathyrn Sheriff   Encounter Date: 02/15/2021   PT End of Session - 02/15/21 1622     Visit Number 6    Number of Visits 17    Date for PT Re-Evaluation 02/18/21    Authorization Type BCBS    PT Start Time 1534    PT Stop Time 1615    PT Time Calculation (min) 41 min    Equipment Utilized During Treatment Gait belt    Activity Tolerance Patient tolerated treatment well   headache comes on with sit>L sidelying>sit edge of mat   Behavior During Therapy [facility] for tasks assessed/performed             Past Medical History:  Diagnosis Date   Allergy    Anxiety    Arthritis    knees and spine, shoulder   Asthma    Bronchitis    hx - recurrent   Complication of anesthesia    waking up is not easy   Depression    Elevated IgE level 09/12/2017   09/12/2017 IgE 195   H/O miscarriage, not currently pregnant    Hx of irritable bowel syndrome    x2   Hyperlipidemia    diet controlled - no medication   Hypertension    not taking any meds at present - under control per patient   Hypokalemia    with PNA admission (2.5)   Ischemic cerebrovascular accident (CVA) ([place])    Migraines    Pneumonia    4 episodes; hosp. admission 2014   PONV (postoperative nausea and vomiting)    Thyroid disease    UC (ulcerative colitis) [facility])    DX'D 2021    Past Surgical History:  Procedure Laterality Date   BREAST SURGERY     implants, then had them removed   COLONOSCOPY     greater 10 yrs ago - ? [facility]-2017 LAST   DILATION AND CURETTAGE OF UTERUS     IR ANGIO INTRA EXTRACRAN SEL INTERNAL CAROTID BILAT MOD SED  11/19/2020   IR ANGIO VERTEBRAL SEL VERTEBRAL BILAT MOD SED  11/19/2020   IR ANGIOGRAM  FOLLOW UP STUDY  11/19/2020   IR ANGIOGRAM FOLLOW UP STUDY  11/19/2020   IR ANGIOGRAM FOLLOW UP STUDY  11/19/2020   IR ANGIOGRAM FOLLOW UP STUDY  11/19/2020   IR ANGIOGRAM FOLLOW UP STUDY  11/19/2020   IR ANGIOGRAM FOLLOW UP STUDY  11/19/2020   IR ANGIOGRAM FOLLOW UP STUDY  11/19/2020   IR ANGIOGRAM FOLLOW UP STUDY  11/19/2020   IR INTRA CRAN STENT  11/19/2020   IR TRANSCATH/EMBOLIZ  11/19/2020   LAPAROSCOPIC ABDOMINAL EXPLORATION  01/31/1992   endometriosis   ORIF HUMERUS FRACTURE Left 04/01/2013   DR Ninfa Linden - shoulder   ORIF HUMERUS FRACTURE Left 04/01/2013   Procedure: OPEN REDUCTION INTERNAL FIXATION (ORIF) LEFT PROXIMAL HUMERUS FRACTURE;  Surgeon: Mcarthur Rossetti, MD;  Location: [place];  Service: Orthopedics;  Laterality: Left;   RADIOLOGY WITH ANESTHESIA N/A 11/19/2020   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;  Surgeon: Consuella Lose, MD;  Location: [place];  Service: Radiology;  Laterality: N/A;   TONSILLECTOMY AND ADENOIDECTOMY      There were no vitals filed for this visit.   Subjective  Assessment - 02/15/21 1530     Subjective Had a bad migraine for the past 3 days which is still 20% there. Apologizes for missing last session. Aggravating factors for migraines include stress, weather, lack of sleep. Reports that she still has trouble with balancing on foam but everything else is getting better. Still has some intermittent dizziness with rolling L. Reports no dizziness for a couple weeks.    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  ED/hospital 11/5-11/7/22; ED 12/16/20 with blurred vision and L sided weakness; imaging shows evolution of R PCA infarct and subacute infarct R cerebellum)    Patient Stated Goals Pt's goals for therapy are to build strength in L side, work on balance.    Currently in Pain? Yes    Pain Score 5      Pain Location Head    Pain Orientation --   frontal HA   Pain Descriptors / Indicators Headache    Pain Type Acute pain                [facility] PT Assessment - 02/15/21 1540       Assessment   Medical Diagnosis CVA    Referring Provider (PT) Kathyrn Sheriff    Onset Date/Surgical Date 11/19/20      Functional Gait  Assessment   Gait assessed  Yes    Gait Level Surface Walks 20 ft in less than 5.5 sec, no assistive devices, good speed, no evidence for imbalance, normal gait pattern, deviates no more than 6 in outside of the 12 in walkway width.    Change in Gait Speed Able to smoothly change walking speed without loss of balance or gait deviation. Deviate no more than 6 in outside of the 12 in walkway width.    Gait with Horizontal Head Turns Performs head turns smoothly with no change in gait. Deviates no more than 6 in outside 12 in walkway width    Gait with Vertical Head Turns Performs head turns with no change in gait. Deviates no more than 6 in outside 12 in walkway width.    Gait and Pivot Turn Pivot turns safely within 3 sec and stops quickly with no loss of balance.    Step Over Obstacle Is able to step over 2 stacked shoe boxes taped together (9 in total height) without changing gait speed. No evidence of imbalance.    Gait with Narrow Base of Support Is able to ambulate for 10 steps heel to toe with no staggering.    Gait with Eyes Closed Walks 20 ft, no assistive devices, good speed, no evidence of imbalance, normal gait pattern, deviates no more than 6 in outside 12 in walkway width. Ambulates 20 ft in less than 7 sec.    Ambulating Backwards Walks 20 ft, no assistive devices, good speed, no evidence for imbalance, normal gait    Steps Alternating feet, no rail.    Total Score 30                            Vestibular Treatment/Exercise - 02/15/21 0001       Vestibular Treatment/Exercise   Habituation Exercises --   rolling R/L with EC 2x, EO 1x each;  c/o slightly more dizziness with Madelynn Done   Number of Reps  2    Symptom Description  1x each EO and EC; 4/10 dizziness sitting up from B sides      X1 Viewing Horizontal   Foot Position standing, standing on foam    Reps 10   2x10   Comments 2/10 blurriness      X1 Viewing Vertical   Foot Position standing, standing on foam    Reps 10   2x10   Comments 2/10 blurriness; 1 episode of poor gaze fixation                Balance Exercises - 02/15/21 0001       Balance Exercises: Standing   Standing Eyes Opened Foam/compliant surface;1 rep;30 secs;Narrow base of support (BOS)    Standing Eyes Closed Wide (BOA);Foam/compliant surface;1 rep;30 secs                PT Education - 02/15/21 1613     Education Details update to HEP-  Access Code: [email or web address]; edu on fall prevention info; edu on current progress and remaining impairments    Person(s) Educated Patient    Methods Explanation;Demonstration;Tactile cues;Verbal cues;Handout    Comprehension Verbalized understanding;Returned demonstration              PT Short Term Goals - 02/15/21 1626       PT SHORT TERM GOAL #1   Title Pt will be independent with HEP for improved strength, balance, and gait.  TARGET 01/21/2021    Baseline per report, pt is performing horizontal roll and open book stretch 01/20/21    Time 4    Period Weeks    Status Achieved      PT SHORT TERM GOAL #2   Title Pt will rate dizziness/unsteadiness as less than or equal to 4/10 with functional gait activities.    Baseline 7/10 with FGA; variable reports during sessions, 5-8/10    Time 4    Period Weeks    Status Achieved   reports 0/10              PT Long Term Goals - 02/15/21 1627       PT LONG TERM GOAL #1   Title Pt will be independent with HEP for improved strength, balance, and gait.  TARGET 02/18/2021    Time 8    Period Weeks    Status Achieved      PT LONG TERM GOAL #2   Title Pt will improve FGA to at  least 25/30 for decreased fall risk.    Baseline 22/30    Time 8    Period Weeks    Status Achieved      PT LONG TERM GOAL #3   Title Pt will rate dizziness/unsteadiness symtpoms as less than or equal to 2/10 with functional gait activiteis.    Time 8    Period Weeks    Status Achieved      PT LONG TERM GOAL #4   Title Pt will verbalize understanding of fall prevention in home environment.    Time 8    Period Weeks    Status Achieved                   Plan - 02/15/21 1622     Clinical Impression Statement Patient arrived to session with report of having had a migraine for the past 3 days which is 80% improved today. Reports improvement in dizziness as she now only has intermittent dizziness when rolling L and some trouble with imbalance with when standing on foam, EC,  and tandem walking. Notes 0/10 dizziness on a typical day. Patient scored 30/30 on FGA, indicating a decreased risk of falls. Provided handout on fall preventing info and reviewed this with patient. Reviewed HEP and provided increased challenges, which were tolerated well today. Patient reported understanding of all ed provided today and without complaints at end of session. Patient has met all goals and is ready for D/C at this time with transition to home program.    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    PT Treatment/Interventions ADLs/Self Care Home Management;Gait training;Functional mobility training;Stair training;Therapeutic activities;Therapeutic exercise;Balance training;Neuromuscular re-education;Patient/family education;Vestibular    PT Next Visit Plan DC at this time    Consulted and Agree with Plan of Care Patient             Patient will benefit from skilled therapeutic intervention in order to improve the following deficits and impairments:  Abnormal gait, Difficulty walking, Dizziness,  Decreased balance, Decreased mobility, Decreased strength  Visit Diagnosis: Unsteadiness on feet  Dizziness and giddiness     Problem List Patient Active Problem List   Diagnosis Date Noted   Acute CVA (cerebrovascular accident) ([place])    TIA (transient ischemic attack) 12/17/2020   Hypokalemia 12/17/2020   Moderate persistent asthma 12/17/2020   Hypertension    CVA (cerebral vascular accident) ([place]) 12/04/2020   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   Family history of heart disease 09/03/2019   Mild intermittent asthma 07/25/2019   Osteoporosis 06/12/2019   Elevated LFTs 06/06/2019   Hematuria 06/06/2019   Elevated IgE level 09/12/2017   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) 09/12/2017   Headache, migraine 02/15/2015   Vitamin D deficiency 10/19/2014   Encounter for preventive health examination 10/19/2014   Depression with anxiety 09/02/2014   Dyspnea on exertion 08/26/2014   Hyperlipidemia 08/18/2014   Hypothyroidism 08/18/2014   Obesity (BMI 30-39.9) 08/18/2014   PHYSICAL THERAPY DISCHARGE SUMMARY  Visits from Start of Care: 6  Current functional level related to goals / functional outcomes: See above clinical impression   Remaining deficits: Dizziness with HEP   Education / Equipment: HEP  Plan: Patient agrees to discharge.  Patient goals were met. Patient is being discharged due to meeting the stated rehab goals.      Janene Harvey, PT, DPT 02/15/21 4:29 PM   [facility] [place] [address], [place], [place], [address] Phone: [phone]   Fax:  [phone]  Name: Tiffany Horn MRN: [id number] Date of Birth: Oct 20, 1960

## 2021-02-15 NOTE — Patient Instructions (Signed)
° °  You can continue with: Do a recipe (new) each week.  Write summaries from notes you've taken from podcasts  Take notes on sermons

## 2021-02-15 NOTE — Therapy (Signed)
[facility] [place] [address] [place], [place], [address] Phone: [phone]   Fax:  [phone]  Speech Language Pathology Treatment  Patient Details  Name: Tiffany Horn MRN: [id number] Date of Birth: [birth year] Referring Provider (SLP): Wendee Beavers MD (referring), Howard Pouch A NP (documentation)   Encounter Date: 02/15/2021   End of Session - 02/15/21 1641     Number of Visits 19    Date for SLP Re-Evaluation 02/25/21   added one week due to holiday   Activity Tolerance Patient tolerated treatment well             Past Medical History:  Diagnosis Date   Allergy    Anxiety    Arthritis    knees and spine, shoulder   Asthma    Bronchitis    hx - recurrent   Complication of anesthesia    waking up is not easy   Depression    Elevated IgE level 09/12/2017   09/12/2017 IgE 195   H/O miscarriage, not currently pregnant    Hx of irritable bowel syndrome    x2   Hyperlipidemia    diet controlled - no medication   Hypertension    not taking any meds at present - under control per patient   Hypokalemia    with PNA admission (2.5)   Ischemic cerebrovascular accident (CVA) ([place])    Migraines    Pneumonia    4 episodes; hosp. admission 2014   PONV (postoperative nausea and vomiting)    Thyroid disease    UC (ulcerative colitis) [facility])    DX'D 2021    Past Surgical History:  Procedure Laterality Date   BREAST SURGERY     implants, then had them removed   COLONOSCOPY     greater 10 yrs ago - ? [facility]-2017 LAST   DILATION AND CURETTAGE OF UTERUS     IR ANGIO INTRA EXTRACRAN SEL INTERNAL CAROTID BILAT MOD SED  11/19/2020   IR ANGIO VERTEBRAL SEL VERTEBRAL BILAT MOD SED  11/19/2020   IR ANGIOGRAM FOLLOW UP STUDY  11/19/2020   IR ANGIOGRAM FOLLOW UP STUDY  11/19/2020   IR ANGIOGRAM FOLLOW UP STUDY  11/19/2020   IR ANGIOGRAM FOLLOW UP STUDY  11/19/2020   IR ANGIOGRAM FOLLOW UP STUDY  11/19/2020   IR  ANGIOGRAM FOLLOW UP STUDY  11/19/2020   IR ANGIOGRAM FOLLOW UP STUDY  11/19/2020   IR ANGIOGRAM FOLLOW UP STUDY  11/19/2020   IR INTRA CRAN STENT  11/19/2020   IR TRANSCATH/EMBOLIZ  11/19/2020   LAPAROSCOPIC ABDOMINAL EXPLORATION  01/31/1992   endometriosis   ORIF HUMERUS FRACTURE Left 04/01/2013   DR Ninfa Linden - shoulder   ORIF HUMERUS FRACTURE Left 04/01/2013   Procedure: OPEN REDUCTION INTERNAL FIXATION (ORIF) LEFT PROXIMAL HUMERUS FRACTURE;  Surgeon: Mcarthur Rossetti, MD;  Location: [place];  Service: Orthopedics;  Laterality: Left;   RADIOLOGY WITH ANESTHESIA N/A 11/19/2020   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;  Surgeon: Consuella Lose, MD;  Location: [place];  Service: Radiology;  Laterality: N/A;   TONSILLECTOMY AND ADENOIDECTOMY      There were no vitals filed for this visit.   Subjective Assessment - 02/15/21 1616     Subjective "I brought home a bunch of books and started planning for a second grade group and didn't struggle at all with transferring information!"    Currently in Pain? Yes    Pain Score 5     Pain  Location Head    Pain Orientation Anterior    Pain Descriptors / Indicators Headache                   ADULT SLP TREATMENT - 02/15/21 1617       General Information   Behavior/Cognition Cooperative;Alert;Pleasant mood      Cognitive-Linquistic Treatment   Treatment focused on Aphasia;Patient/family/caregiver education    Skilled Treatment Pt related to SLP that Saturday night was the first she was able to do what she described in "s". She did not have to have directions repeated or take notes how to do a new procedure at school. Danah stated that her confidence is very high right now about her ability to listen and write information. She has now started listening to podcasts regularly and writes things down while listening as a part of her routine. She verbally described memory compensations with excellent success (writing down  things in a planner at home, or note pads if she is out and then transferring into planner PRN,) Today pt wrote two-paragraph selection on summarizing what take aways she has had so far from [place].      Assessment / Recommendations / Plan   Plan --   likely d/c next 1-2 visits, next visit in 2 weeks; reduce to once/week     Progression Toward Goals   Progression toward goals Progressing toward goals              SLP Education - 02/15/21 1641     Education Details home tasks during next two weeks and after d/c    Person(s) Educated Patient    Methods Explanation;Handout    Comprehension Verbalized understanding              SLP Short Term Goals - 02/01/21 2340       SLP SHORT TERM GOAL #1   Title pt will tell SLP 4 practical compensatory measures she could use for memory in 2 sessions    Baseline 01-20-21    Status Achieved    Target Date 01/28/21   extended two weeks due to only seen pt 2 sesssions     SLP SHORT TERM GOAL #2   Title pt will write one paragraph detailed information/mod complex topic with modified independence (compensations) in 2 sessions    Status Not Met    Target Date 01/28/21      SLP SHORT TERM GOAL #3   Title pt will functionally verbally describe mod complex information (sequences, technical data, etc) in 3 sessions    Baseline 01-20-21, 01-31-21    Status Partially Met    Target Date 01/28/21      SLP SHORT TERM GOAL #4   Title SLP to more formally assess pt's written language skills    Time 2    Period Weeks    Status Not Met   renewed for 01-28-21   Target Date 01/28/21              SLP Long Term Goals - 02/15/21 1627       SLP LONG TERM GOAL #1   Title pt will functionally verbally describe complex information (sequences, technical data, job responsibilities, etc) in 3 sessions    Baseline 02-15-21    Time 9    Period Weeks    Status On-going    Target Date 02/25/21      SLP LONG TERM GOAL #2   Title pt will functionally  describe her successful utilization of  memory strategies and/or attention compensations in 2 sessions    Baseline 02-15-21    Time 9    Period Weeks    Status On-going    Target Date 02/25/21      SLP LONG TERM GOAL #3   Title pt will generate two paragraphs written information (work-like, mod complex/complex information) without errors, after self correction, in 3 sessions    Baseline 02-15-21    Time 9    Period Weeks    Status On-going    Target Date 02/25/21      SLP LONG TERM GOAL #4   Title pt will show independence with langauge enhancing treatments she can do at home (VNeST, SFA) in two sesssions    Status Deferred              Plan - 02/15/21 1656     Clinical Impression Statement Reesha Debes presents with mild expressive aphasia following multiple CVAs in the last ~6 weeks- assessed as mildly deficient in word finding/anomia in verbal expression, and reported as impairments in sentence construction in written expression. New rt cerebellar infarct was ID'd last week in ED. Pt scored outside of the normal range with the [place]- 2nd Ed. Pt also endorses some deficits with memory and this affects her auditory comprehension  Jaslynn continues writing down many items so that she will remember them. Today she shared she feels like much of her abilities in these areas has improved since last session and she was able to have some success with written langauge and auditory comprehension. SLP believes pt will benefit from one-two more sessions skilled ST to verify progress is continuing at an appropriate level.    Speech Therapy Frequency 1x /week    Duration --   9 weeks   Treatment/Interventions Patient/family education;Internal/external aids;Cognitive reorganization;Compensatory strategies;SLP instruction and feedback;Cueing hierarchy;Language facilitation;Functional tasks;Environmental controls    Potential to Achieve Goals Good    Consulted and Agree with Plan of Care  Patient             Patient will benefit from skilled therapeutic intervention in order to improve the following deficits and impairments:   Expressive aphasia  Cognitive communication deficit    Problem List Patient Active Problem List   Diagnosis Date Noted   Acute CVA (cerebrovascular accident) ([place])    TIA (transient ischemic attack) 12/17/2020   Hypokalemia 12/17/2020   Moderate persistent asthma 12/17/2020   Hypertension    CVA (cerebral vascular accident) ([place]) 12/04/2020   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[place]) 11/17/2020   Family history of heart disease 09/03/2019   Mild intermittent asthma 07/25/2019   Osteoporosis 06/12/2019   Elevated LFTs 06/06/2019   Hematuria 06/06/2019   Elevated IgE level 09/12/2017   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) 09/12/2017   Headache, migraine 02/15/2015   Vitamin D deficiency 10/19/2014   Encounter for preventive health examination 10/19/2014   Depression with anxiety 09/02/2014   Dyspnea on exertion 08/26/2014   Hyperlipidemia 08/18/2014   Hypothyroidism 08/18/2014   Obesity (BMI 30-39.9) 08/18/2014    [place], [place] 02/15/2021, 5:00 PM  [facility] [address], [place], [place], [address] Phone: [phone]   Fax:  [phone]   Name: Monzerrath Mcburney MRN: [id number] Date of Birth: [birth year]

## 2021-02-15 NOTE — Progress Notes (Deleted)
Cardiology Office Note:    Date:  02/16/2021   ID:  Tiffany Horn, DOB 1960/08/27, MRN [id number]  PCP:  [name], DO   [facility] Providers Cardiologist:  Candee Furbish, MD { Click to update primary MD,subspecialty MD or APP then REFRESH:1}    Referring MD: Howard Pouch A, DO   No chief complaint on file. ***  History of Present Illness:    Tiffany Horn is a 61 y.o. female with a hx of CVA, HTN, asthma, hypothyroid,   She was referred to our group by her PCP for evaluation of chest pain and seen by Dr. Marlou Porch on 03/02/20.  She reported that on 02/26/20 she woke up at 3:30 in the morning with severe sharp epigastric and lower sternal chest pain radiating to her left arm and tingling in her left fingers.  Reported the pain was severe and she tried drinking water. She also felt short of breath and sweaty with the pain. The following morning she noticed pain when getting dressed for work but also with walking. Additionally, she had more diaphoresis and SOB. Her coworkers called [employer] and she was given NTG and aspirin without significant improvement. Her nausea was helped by Zofran. Family history is significant for stroke and MI.  During hospitalization, her troponin levels were unremarkable and EKG showed SR at 80 bpm without ST/T wave abnormality. Coronary CTA on 03/15/20 revealed coronary calcium score of 0, no evidence of CAD.   On 11/16/20, she presented to [facility] ED for diplopia and significantly worsening headache. 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 She underwent stent-supported coil embolization that was complicated by partial intraoperative thrombosis within stent. She was d/ced on 11/22/20.  Returned to [facility] ED on 12/04/20   Seen by Dr. Leonie Man on 02/10/21  Past Medical History:  Diagnosis Date   Allergy    Anxiety    Arthritis    knees and spine, shoulder   Asthma    Bronchitis    hx - recurrent   Complication of anesthesia    waking up is not  easy   Depression    Elevated IgE level 09/12/2017   09/12/2017 IgE 195   H/O miscarriage, not currently pregnant    Hx of irritable bowel syndrome    x2   Hyperlipidemia    diet controlled - no medication   Hypertension    not taking any meds at present - under control per patient   Hypokalemia    with PNA admission (2.5)   Ischemic cerebrovascular accident (CVA) ([place])    Migraines    Pneumonia    4 episodes; hosp. admission 2014   PONV (postoperative nausea and vomiting)    Thyroid disease    UC (ulcerative colitis) [facility])    DX'D 2021    Past Surgical History:  Procedure Laterality Date   BREAST SURGERY     implants, then had them removed   COLONOSCOPY     greater 10 yrs ago - ? [facility]-2017 LAST   DILATION AND CURETTAGE OF UTERUS     IR ANGIO INTRA EXTRACRAN SEL INTERNAL CAROTID BILAT MOD SED  11/19/2020   IR ANGIO VERTEBRAL SEL VERTEBRAL BILAT MOD SED  11/19/2020   IR ANGIOGRAM FOLLOW UP STUDY  11/19/2020   IR ANGIOGRAM FOLLOW UP STUDY  11/19/2020   IR ANGIOGRAM FOLLOW UP STUDY  11/19/2020   IR ANGIOGRAM FOLLOW UP STUDY  11/19/2020   IR ANGIOGRAM FOLLOW UP STUDY  11/19/2020   IR ANGIOGRAM FOLLOW UP STUDY  11/19/2020   IR ANGIOGRAM FOLLOW UP STUDY  11/19/2020   IR ANGIOGRAM FOLLOW UP STUDY  11/19/2020   IR INTRA CRAN STENT  11/19/2020   IR TRANSCATH/EMBOLIZ  11/19/2020   LAPAROSCOPIC ABDOMINAL EXPLORATION  01/31/1992   endometriosis   ORIF HUMERUS FRACTURE Left 04/01/2013   DR Ninfa Linden - shoulder   ORIF HUMERUS FRACTURE Left 04/01/2013   Procedure: OPEN REDUCTION INTERNAL FIXATION (ORIF) LEFT PROXIMAL HUMERUS FRACTURE;  Surgeon: Mcarthur Rossetti, MD;  Location: [place];  Service: Orthopedics;  Laterality: Left;   RADIOLOGY WITH ANESTHESIA N/A 11/19/2020   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;  Surgeon: Consuella Lose, MD;  Location: [place];  Service: Radiology;  Laterality: N/A;   TONSILLECTOMY AND ADENOIDECTOMY      Current  Medications: No outpatient medications have been marked as taking for the 02/16/21 encounter (Appointment) with Ann Maki, Lanice Schwab, NP.   Current Facility-Administered Medications for the 02/16/21 encounter (Appointment) with Felicia Both, Lanice Schwab, NP  Medication   0.9 %  sodium chloride infusion     Allergies:   Abilify [aripiprazole], Amoxicillin, Promethazine hcl, and Seroquel [quetiapine fumarate]   Social History   Socioeconomic History   Marital status: Divorced    Spouse name: Not on file   Number of children: 0   Years of education: Not on file   Highest education level: Not on file  Occupational History   Occupation: Control and instrumentation engineer  Tobacco Use   Smoking status: Never   Smokeless tobacco: Never  Vaping Use   Vaping Use: Never used  Substance and Sexual Activity   Alcohol use: Yes    Alcohol/week: 2.0 standard drinks    Types: 2 Glasses of wine per week    Comment: OCC / SOCIAL  MAYBE 2 A WEEK   Drug use: No   Sexual activity: Not Currently    Partners: Male    Birth control/protection: Post-menopausal  Other Topics Concern   Not on file  Social History Narrative   She is originally from [place]. She has traveled to [facility] [place], [place], [place], [place], [place], [place], [place], [place], [place]. No international travel. She has dogs. No prior bird, mold, or recent hot tub exposure. She hasn't used her hot tub in 1.5 years. 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 Previously enjoyed gardening and playing tennis. Helps to care for her mother.   Social Determinants of Health   Financial Resource Strain: Not on file  Food Insecurity: Not on file  Transportation Needs: Not on file  Physical Activity: Not on file  Stress: Not on file  Social Connections: Not on file     Family History: The patient's ***family history includes Breast cancer in her mother; Colon cancer in her father; Dementia in her father; Diabetes in her father, mother, and  paternal grandmother; Heart disease in her mother; Stroke in her paternal grandmother. There is no history of Colon polyps, Esophageal cancer, Stomach cancer, or Rectal cancer.  ROS:   Please see the history of present illness.    *** All other systems reviewed and are negative.  Labs/Other Studies Reviewed:    The following studies were reviewed today: ***  Recent Labs: 11/16/2020: TSH 10.657 12/17/2020: Magnesium 2.0 01/10/2021: ALT 20; BUN 12; Creatinine, Ser 0.70; Hemoglobin 15.0; Platelets 241; Potassium 3.6; Sodium 141  Recent Lipid Panel    Component Value Date/Time   CHOL  227 (H) 12/05/2020 0435   TRIG 114 12/05/2020 0435   HDL 48 12/05/2020 0435   CHOLHDL 4.7 12/05/2020 0435   VLDL 23 12/05/2020 0435   LDLCALC 156 (H) 12/05/2020 0435   LDLCALC 172 (H) 05/21/2019 1442     Risk Assessment/Calculations:   {Does this patient have ATRIAL FIBRILLATION?[phone]}       Physical Exam:    VS:  LMP 01/31/2007     Wt Readings from Last 3 Encounters:  02/10/21 175 lb 6 oz (79.5 kg)  01/10/21 177 lb (80.3 kg)  12/16/20 170 lb (77.1 kg)     GEN: *** Well nourished, well developed in no acute distress HEENT: Normal NECK: No JVD; No carotid bruits LYMPHATICS: No lymphadenopathy CARDIAC: ***RRR, no murmurs, rubs, gallops RESPIRATORY:  Clear to auscultation without rales, wheezing or rhonchi  ABDOMEN: Soft, non-tender, non-distended MUSCULOSKELETAL:  No edema; No deformity. *** pedal pulses, ***bilaterally SKIN: Warm and dry NEUROLOGIC:  Alert and oriented x 3 PSYCHIATRIC:  Normal affect   EKG:  EKG is *** ordered today.  The ekg ordered today demonstrates ***  Diagnoses:    1. Aortic atherosclerosis ([place])   2. Cerebrovascular accident (CVA), unspecified mechanism ([place])   3. Mixed hyperlipidemia    Assessment and Plan:     ***      {Are you ordering a CV Procedure (e.g. stress test, cath, DCCV, TEE, etc)?   Press F2        :[id number]}    Medication  Adjustments/Labs and Tests Ordered: Current medicines are reviewed at length with the patient today.  Concerns regarding medicines are outlined above.  No orders of the defined types were placed in this encounter.  No orders of the defined types were placed in this encounter.   There are no Patient Instructions on file for this visit.   Signed, Emmaline Life, NP  02/16/2021 1:21 PM    [facility]

## 2021-02-15 NOTE — Patient Instructions (Addendum)

## 2021-02-16 ENCOUNTER — Ambulatory Visit: Payer: BC Managed Care – PPO | Admitting: Nurse Practitioner

## 2021-02-18 ENCOUNTER — Other Ambulatory Visit: Payer: Self-pay | Admitting: Neurosurgery

## 2021-02-18 DIAGNOSIS — I671 Cerebral aneurysm, nonruptured: Secondary | ICD-10-CM

## 2021-02-25 ENCOUNTER — Ambulatory Visit (HOSPITAL_COMMUNITY)
Admission: RE | Admit: 2021-02-25 | Discharge: 2021-02-25 | Disposition: A | Discharge status: Home / Self Care | Payer: BC Managed Care – PPO | Source: Ambulatory Visit | Attending: Neurosurgery | Admitting: Neurosurgery

## 2021-02-25 ENCOUNTER — Other Ambulatory Visit: Payer: Self-pay | Admitting: Neurosurgery

## 2021-02-25 ENCOUNTER — Other Ambulatory Visit: Payer: Self-pay

## 2021-02-25 DIAGNOSIS — I671 Cerebral aneurysm, nonruptured: Secondary | ICD-10-CM | POA: Diagnosis present

## 2021-02-25 HISTORY — PX: IR ANGIO VERTEBRAL SEL VERTEBRAL UNI L MOD SED: IMG5367

## 2021-02-25 LAB — CBC WITH DIFFERENTIAL/PLATELET
Abs Immature Granulocytes: 0.01 10*3/uL (ref 0.00–0.07)
Basophils Absolute: 0.1 10*3/uL (ref 0.0–0.1)
Basophils Relative: 1 %
Eosinophils Absolute: 0.2 10*3/uL (ref 0.0–0.5)
Eosinophils Relative: 4 %
HCT: 39.7 % (ref 36.0–46.0)
Hemoglobin: 13.2 g/dL (ref 12.0–15.0)
Immature Granulocytes: 0 %
Lymphocytes Relative: 33 %
Lymphs Abs: 1.5 10*3/uL (ref 0.7–4.0)
MCH: 30.1 pg (ref 26.0–34.0)
MCHC: 33.2 g/dL (ref 30.0–36.0)
MCV: 90.4 fL (ref 80.0–100.0)
Monocytes Absolute: 0.4 10*3/uL (ref 0.1–1.0)
Monocytes Relative: 9 %
Neutro Abs: 2.3 10*3/uL (ref 1.7–7.7)
Neutrophils Relative %: 53 %
Platelets: 226 10*3/uL (ref 150–400)
RBC: 4.39 MIL/uL (ref 3.87–5.11)
RDW: 12.4 % (ref 11.5–15.5)
WBC: 4.5 10*3/uL (ref 4.0–10.5)
nRBC: 0 % (ref 0.0–0.2)

## 2021-02-25 LAB — PROTIME-INR
INR: 0.9 (ref 0.8–1.2)
Prothrombin Time: 11.7 seconds (ref 11.4–15.2)

## 2021-02-25 LAB — APTT: aPTT: 28 seconds (ref 24–36)

## 2021-02-25 MED ORDER — CHLORHEXIDINE GLUCONATE CLOTH 2 % EX PADS: 6.0000 | MEDICATED_PAD | Freq: Once | CUTANEOUS | Status: DC

## 2021-02-25 MED ORDER — MIDAZOLAM HCL 2 MG/2ML IJ SOLN
INTRAMUSCULAR | Status: AC
  Filled 2021-02-25: qty 2

## 2021-02-25 MED ORDER — HEPARIN SODIUM (PORCINE) 1000 UNIT/ML IJ SOLN
INTRAMUSCULAR | Status: AC
  Filled 2021-02-25: qty 10

## 2021-02-25 MED ORDER — HYDROCODONE-ACETAMINOPHEN 5-325 MG PO TABS: 1.0000 | ORAL_TABLET | ORAL | Status: DC | PRN

## 2021-02-25 MED ORDER — LIDOCAINE HCL 1 % IJ SOLN
INTRAMUSCULAR | Status: AC
  Administered 2021-02-25: 10 mL via SUBCUTANEOUS
  Filled 2021-02-25: qty 20

## 2021-02-25 MED ORDER — HEPARIN SODIUM (PORCINE) 1000 UNIT/ML IJ SOLN
INTRAMUSCULAR | Status: AC | PRN
  Administered 2021-02-25: 2000 [IU] via INTRAVENOUS

## 2021-02-25 MED ORDER — FENTANYL CITRATE (PF) 100 MCG/2ML IJ SOLN
INTRAMUSCULAR | Status: AC | PRN
  Administered 2021-02-25: 25 ug via INTRAVENOUS

## 2021-02-25 MED ORDER — MIDAZOLAM HCL 2 MG/2ML IJ SOLN
INTRAMUSCULAR | Status: AC | PRN
  Administered 2021-02-25: 1 mg via INTRAVENOUS

## 2021-02-25 MED ORDER — VANCOMYCIN HCL IN DEXTROSE 1-5 GM/200ML-% IV SOLN: 1000.0000 mg | INTRAVENOUS | Status: DC

## 2021-02-25 MED ORDER — FENTANYL CITRATE (PF) 100 MCG/2ML IJ SOLN
INTRAMUSCULAR | Status: AC
  Filled 2021-02-25: qty 2

## 2021-02-25 MED ORDER — IOHEXOL 300 MG/ML  SOLN
100.0000 mL | Freq: Once | INTRAMUSCULAR | Status: AC | PRN
  Administered 2021-02-25: 18 mL via INTRA_ARTERIAL

## 2021-02-25 MED ORDER — SODIUM CHLORIDE 0.9 % IV SOLN: INTRAVENOUS | Status: DC

## 2021-02-25 NOTE — Sedation Documentation (Signed)
5 Fr Exoseal deployed right groin

## 2021-02-25 NOTE — H&P (Signed)
Chief Complaint  Brain Aneurysm  History of Present Illness  Tiffany Horn is a 61 y.o. female previously admitted to the hospital with acute onset of oculomotor palsy.  Her work-up included CT angiogram revealing a basilar apex aneurysm.  She underwent coil embolization.  She had episodes of visual disturbance and unilateral weakness several times postoperatively however extensive work-up did not reveal any significant unexpected pathology.  Over the last few months she is actually done quite well.  No further episodes of weakness.  She presents today for routine short-term follow-up angiogram.  Past Medical History   Past Medical History:  Diagnosis Date   Allergy    Anxiety    Arthritis    knees and spine, shoulder   Asthma    Bronchitis    hx - recurrent   Complication of anesthesia    waking up is not easy   Depression    Elevated IgE level 09/12/2017   09/12/2017 IgE 195   H/O miscarriage, not currently pregnant    Hx of irritable bowel syndrome    x2   Hyperlipidemia    diet controlled - no medication   Hypertension    not taking any meds at present - under control per patient   Hypokalemia    with PNA admission (2.5)   Ischemic cerebrovascular accident (CVA) ([place])    Migraines    Pneumonia    4 episodes; hosp. admission 2014   PONV (postoperative nausea and vomiting)    Thyroid disease    UC (ulcerative colitis) [facility])    DX'D 2021    Past Surgical History   Past Surgical History:  Procedure Laterality Date   BREAST SURGERY     implants, then had them removed   COLONOSCOPY     greater 10 yrs ago - ? [facility]-2017 LAST   DILATION AND CURETTAGE OF UTERUS     IR ANGIO INTRA EXTRACRAN SEL INTERNAL CAROTID BILAT MOD SED  11/19/2020   IR ANGIO VERTEBRAL SEL VERTEBRAL BILAT MOD SED  11/19/2020   IR ANGIOGRAM FOLLOW UP STUDY  11/19/2020   IR ANGIOGRAM FOLLOW UP STUDY  11/19/2020   IR ANGIOGRAM FOLLOW UP STUDY  11/19/2020   IR ANGIOGRAM FOLLOW UP STUDY   11/19/2020   IR ANGIOGRAM FOLLOW UP STUDY  11/19/2020   IR ANGIOGRAM FOLLOW UP STUDY  11/19/2020   IR ANGIOGRAM FOLLOW UP STUDY  11/19/2020   IR ANGIOGRAM FOLLOW UP STUDY  11/19/2020   IR INTRA CRAN STENT  11/19/2020   IR TRANSCATH/EMBOLIZ  11/19/2020   LAPAROSCOPIC ABDOMINAL EXPLORATION  01/31/1992   endometriosis   ORIF HUMERUS FRACTURE Left 04/01/2013   DR Ninfa Linden - shoulder   ORIF HUMERUS FRACTURE Left 04/01/2013   Procedure: OPEN REDUCTION INTERNAL FIXATION (ORIF) LEFT PROXIMAL HUMERUS FRACTURE;  Surgeon: Mcarthur Rossetti, MD;  Location: [place];  Service: Orthopedics;  Laterality: Left;   RADIOLOGY WITH ANESTHESIA N/A 11/19/2020   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;  Surgeon: Consuella Lose, MD;  Location: [place];  Service: Radiology;  Laterality: N/A;   TONSILLECTOMY AND ADENOIDECTOMY      Social History   Social History   Tobacco Use   Smoking status: Never   Smokeless tobacco: Never  Vaping Use   Vaping Use: Never used  Substance Use Topics   Alcohol use: Yes    Alcohol/week: 2.0 standard drinks    Types: 2 Glasses of wine per week    Comment: OCC / SOCIAL  MAYBE 2  A WEEK   Drug use: No    Medications   Prior to Admission medications   Medication Sig Start Date End Date Taking? Authorizing Provider  amLODipine (NORVASC) 10 MG tablet Take 1 tablet (10 mg total) by mouth daily. 12/19/20  Yes Eugenie Filler, MD  Ascorbic Acid (VITAMIN C) 1000 MG tablet Take 1,000 mg by mouth daily.   Yes [provider]  aspirin 81 MG chewable tablet Chew 1 tablet (81 mg total) by mouth daily. 11/22/20  Yes Consuella Lose, MD  atorvastatin (LIPITOR) 80 MG tablet Take 1 tablet (80 mg total) by mouth daily. 12/07/20  Yes Mercy Riding, MD  B Complex Vitamins (B COMPLEX PO) Take 1 tablet by mouth daily.   Yes [provider]  ezetimibe (ZETIA) 10 MG tablet Take 1 tablet (10 mg total) by mouth daily. 12/18/20  Yes Eugenie Filler,  MD  fluticasone furoate-vilanterol (BREO ELLIPTA) 100-25 MCG/ACT AEPB Inhale 1 puff into the lungs daily as needed.   Yes [provider]  levothyroxine (SYNTHROID) 112 MCG tablet Take 1 tablet (112 mcg total) by mouth daily. Patient taking differently: Take 112 mcg by mouth at bedtime. 11/25/20  Yes Kuneff, Renee A, DO  MAGNESIUM GLYCINATE PO Take 1 tablet by mouth at bedtime.   Yes [provider]  PARoxetine (PAXIL) 40 MG tablet Take 1 tablet (40 mg total) by mouth daily. 04/23/20  Yes Donnal Moat T, PA-C  rizatriptan (MAXALT) 5 MG tablet Take 1 tablet (5 mg total) by mouth daily as needed for migraine. 11/25/20  Yes Kuneff, Renee A, DO  ticagrelor (BRILINTA) 90 MG TABS tablet Take 1 tablet (90 mg total) by mouth 2 (two) times daily. 11/22/20  Yes Consuella Lose, MD  umeclidinium bromide (INCRUSE ELLIPTA) 62.5 MCG/ACT AEPB Inhale 1 puff into the lungs daily.   Yes [provider]  Budeson-Glycopyrrol-Formoterol (BREZTRI AEROSPHERE) 160-9-4.8 MCG/ACT AERO Inhale 2 puffs into the lungs in the morning and at bedtime. 04/28/20   Icard, Octavio Graves, DO  clonazePAM (KLONOPIN) 0.5 MG tablet Take 1 tablet (0.5 mg total) by mouth 3 (three) times daily as needed for anxiety. 04/23/20   Addison Lank, PA-C  levETIRAcetam (KEPPRA) 500 MG tablet Take 1 tablet (500 mg total) by mouth 2 (two) times daily. Patient not taking: Reported on 01/17/2021 01/10/21   Milton Ferguson, MD  LORazepam (ATIVAN) 0.5 MG tablet Take 0.5 mg by mouth daily as needed. 12/29/20   [provider]  ondansetron (ZOFRAN) 4 MG tablet Take 1 tablet by mouth as needed. 12/29/20   [provider]  Spacer/Aero-Holding Dorise Bullion Use daily with inhaler 04/28/20   Icard, Octavio Graves, DO    Allergies   Allergies  Allergen Reactions   Abilify [Aripiprazole] Nausea And Vomiting   Amoxicillin Diarrhea   Promethazine Hcl    Seroquel [Quetiapine Fumarate]     Unknown    Review of Systems   ROS  Neurologic Exam  Awake, alert, oriented Memory and concentration grossly intact Speech fluent, appropriate CN grossly intact Motor exam: Upper Extremities Deltoid Bicep Tricep Grip  Right 5/5 5/5 5/5 5/5  Left 5/5 5/5 5/5 5/5   Lower Extremities IP Quad PF DF EHL  Right 5/5 5/5 5/5 5/5 5/5  Left 5/5 5/5 5/5 5/5 5/5   Sensation grossly intact to LT   Impression  - 61 y.o. female 3 months status post stent supported coil embolization of the basilar apex aneurysm, doing well  Plan  -We  will proceed with routine short-term follow-up diagnostic cerebral angiogram  I have reviewed the details of the procedure as well as the expected postoperative course and recovery with the patient.  All her questions today were answered.  She provided informed consent to proceed.   Consuella Lose, MD [facility]

## 2021-02-25 NOTE — Brief Op Note (Signed)
°  NEUROSURGERY BRIEF OPERATIVE  NOTE   PREOP DX: Cerebral Aneurysm  POSTOP DX: Same  PROCEDURE: Diagnostic cerebral angiogram  SURGEON: Dr. Consuella Lose, MD  ANESTHESIA: IV Sedation with Local  EBL: Minimal  SPECIMENS: None  COMPLICATIONS: None  CONDITION: Stable to recovery  FINDINGS (Full report in [facility]): 1. Stable small neck residual of previously coiled basilar aneurysm. There is decreased flow through the stented right P1 segment.   Consuella Lose, MD [facility]

## 2021-02-25 NOTE — Sedation Documentation (Signed)
Patient taken to short stay. Erica RN at the bedside to receive patient. Groin site assessed. Clean,dry and intact.Soft to palpation, no hematoma noted. +1 palpable distal pulses intact.

## 2021-02-25 NOTE — Progress Notes (Signed)
Pt ambulated without difficulty or bleeding.   Discharged home with her friend who will drive and stay with pt x 24 hrs.

## 2021-03-01 ENCOUNTER — Ambulatory Visit: Payer: BC Managed Care – PPO

## 2021-03-01 ENCOUNTER — Other Ambulatory Visit: Payer: Self-pay

## 2021-03-01 DIAGNOSIS — R41841 Cognitive communication deficit: Secondary | ICD-10-CM

## 2021-03-01 DIAGNOSIS — R4701 Aphasia: Secondary | ICD-10-CM

## 2021-03-01 DIAGNOSIS — R2681 Unsteadiness on feet: Secondary | ICD-10-CM | POA: Diagnosis not present

## 2021-03-01 NOTE — Therapy (Signed)
[facility] [place], [place], [place], [address] Phone: [phone]   Fax:  [phone]  Speech Language Pathology Treatment - Renewal/Discharge summary  Patient Details  Name: Tiffany Horn MRN: [id number] Date of Birth: January 11, 1961 Referring Provider (SLP): Wendee Beavers MD (referring), Howard Pouch A NP (documentation)   Encounter Date: 03/01/2021   End of Session - 03/01/21 1523     Visit Number 7    Number of Visits 19    Date for SLP Re-Evaluation 03/01/21   added one week due to holiday   SLP Start Time 1448    SLP Stop Time  1515    SLP Time Calculation (min) 27 min    Activity Tolerance Patient tolerated treatment well             Past Medical History:  Diagnosis Date   Allergy    Anxiety    Arthritis    knees and spine, shoulder   Asthma    Bronchitis    hx - recurrent   Complication of anesthesia    waking up is not easy   Depression    Elevated IgE level 09/12/2017   09/12/2017 IgE 195   H/O miscarriage, not currently pregnant    Hx of irritable bowel syndrome    x2   Hyperlipidemia    diet controlled - no medication   Hypertension    not taking any meds at present - under control per patient   Hypokalemia    with PNA admission (2.5)   Ischemic cerebrovascular accident (CVA) ([place])    Migraines    Pneumonia    4 episodes; hosp. admission 2014   PONV (postoperative nausea and vomiting)    Thyroid disease    UC (ulcerative colitis) [facility])    DX'D 2021    Past Surgical History:  Procedure Laterality Date   BREAST SURGERY     implants, then had them removed   COLONOSCOPY     greater 10 yrs ago - ? [facility]-2017 LAST   DILATION AND CURETTAGE OF UTERUS     IR ANGIO INTRA EXTRACRAN SEL INTERNAL CAROTID BILAT MOD SED  11/19/2020   IR ANGIO VERTEBRAL SEL VERTEBRAL BILAT MOD SED  11/19/2020   IR ANGIO VERTEBRAL SEL VERTEBRAL UNI L MOD SED  02/25/2021   IR ANGIOGRAM FOLLOW UP STUDY   11/19/2020   IR ANGIOGRAM FOLLOW UP STUDY  11/19/2020   IR ANGIOGRAM FOLLOW UP STUDY  11/19/2020   IR ANGIOGRAM FOLLOW UP STUDY  11/19/2020   IR ANGIOGRAM FOLLOW UP STUDY  11/19/2020   IR ANGIOGRAM FOLLOW UP STUDY  11/19/2020   IR ANGIOGRAM FOLLOW UP STUDY  11/19/2020   IR ANGIOGRAM FOLLOW UP STUDY  11/19/2020   IR INTRA CRAN STENT  11/19/2020   IR TRANSCATH/EMBOLIZ  11/19/2020   LAPAROSCOPIC ABDOMINAL EXPLORATION  01/31/1992   endometriosis   ORIF HUMERUS FRACTURE Left 04/01/2013   DR Ninfa Linden - shoulder   ORIF HUMERUS FRACTURE Left 04/01/2013   Procedure: OPEN REDUCTION INTERNAL FIXATION (ORIF) LEFT PROXIMAL HUMERUS FRACTURE;  Surgeon: Mcarthur Rossetti, MD;  Location: [place];  Service: Orthopedics;  Laterality: Left;   RADIOLOGY WITH ANESTHESIA N/A 11/19/2020   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;  Surgeon: Consuella Lose, MD;  Location: [place];  Service: Radiology;  Laterality: N/A;   TONSILLECTOMY AND ADENOIDECTOMY      There were no vitals filed for this visit.  SPEECH THERAPY RENEWAL/DISCHARGE SUMMARY  Visits  from Start of Care: 7   Current functional level related to goals / functional outcomes: Pt began course of treatment with inability to express herself in written form, but with some difficulties with verbal expression as well. In the last two sessions she has reported WNL written expression, and no difficulty with verbal expression. See goals below, to be enforced for this session today, and then pt to be discharged.   Remaining deficits: Mild memory deficits successfully compensated for by memory compensations such as lists and notes.   Education / Equipment: Engineer, manufacturing, brain breaks.   Patient agrees to discharge. Patient goals were partially met. Patient is being discharged due to being pleased with the current functional level..      Subjective Assessment - 03/01/21 1455     Subjective Pt with consult with neurosurgeon  next week ot look at angiogram taken last week.    Currently in Pain? Yes    Pain Score 6     Pain Location Head    Pain Descriptors / Indicators Headache    Pain Type Acute pain    Pain Onset Yesterday    Pain Frequency Constant                   ADULT SLP TREATMENT - 03/01/21 1457       General Information   Behavior/Cognition Cooperative;Alert;Pleasant mood      Cognitive-Linquistic Treatment   Treatment focused on Aphasia;Patient/family/caregiver education    Skilled Treatment Tiffany Horn tells SLP that reading comp was WNL last night to input 16 weeks of plans (skeletal plans) for tutoring into the computer. She read this morning for a while about mod complex topics (aneurysm) and did not have any difficulties with understanding. Pt continues to use brain breaks for when she feels she has some mental fatigue. She continues to use more detailed lists successfully. Pt agrees she is ready for d/c today.      Assessment / Recommendations / Plan   Plan Discharge SLP treatment due to (comment)   see below     Progression Toward Goals   Progression toward goals --   d/c day - see goals               SLP Short Term Goals - 02/01/21 2340       SLP SHORT TERM GOAL #1   Title pt will tell SLP 4 practical compensatory measures she could use for memory in 2 sessions    Baseline 01-20-21    Status Achieved    Target Date 01/28/21   extended two weeks due to only seen pt 2 sesssions     SLP SHORT TERM GOAL #2   Title pt will write one paragraph detailed information/mod complex topic with modified independence (compensations) in 2 sessions    Status Not Met    Target Date 01/28/21      SLP SHORT TERM GOAL #3   Title pt will functionally verbally describe mod complex information (sequences, technical data, etc) in 3 sessions    Baseline 01-20-21, 01-31-21    Status Partially Met    Target Date 01/28/21      SLP SHORT TERM GOAL #4   Title SLP to more formally assess pt's  written language skills    Time 2    Period Weeks    Status Not Met   renewed for 01-28-21   Target Date 01/28/21              SLP  Long Term Goals - 03/01/21 1526       SLP LONG TERM GOAL #1   Title pt will functionally verbally describe complex information (sequences, technical data, job responsibilities, etc) in 3 sessions    Baseline 02-15-21, 03-01-21    Status Partially Met    Target Date 03/01/21      SLP LONG TERM GOAL #2   Title pt will functionally describe her successful utilization of memory strategies and/or attention compensations in 2 sessions    Baseline 02-15-21    Status Achieved    Target Date 03/01/21      SLP LONG TERM GOAL #3   Title pt will generate two paragraphs written information (work-like, mod complex/complex information) without errors, after self correction, in 3 sessions    Baseline 02-15-21    Status Partially Met    Target Date 03/01/21      SLP LONG TERM GOAL #4   Title pt will show independence with langauge enhancing treatments she can do at home (VNeST, SFA) in two sesssions    Status Deferred              Plan - 03/01/21 1524     Clinical Impression Statement Makaelah Cranfield presents with WNL/WFL expressive language and WNL written language. Eliot continues writing down many items so that she will remember them and also continues to use "brain breaks" when she feels/thinks she needs to rest. She cont'd to describe success with written langauge and auditory comprehension in work situations this past two weeks. Pt told SLP she thinks she is ready for d/c and SLP agrees.    Treatment/Interventions Patient/family education;Internal/external aids;Cognitive reorganization;Compensatory strategies;SLP instruction and feedback;Cueing hierarchy;Language facilitation;Functional tasks;Environmental controls    Potential to Achieve Goals Good    Consulted and Agree with Plan of Care Patient             Patient will benefit from skilled  therapeutic intervention in order to improve the following deficits and impairments:   Expressive aphasia  Cognitive communication deficit    Problem List Patient Active Problem List   Diagnosis Date Noted   Acute CVA (cerebrovascular accident) ([place])    TIA (transient ischemic attack) 12/17/2020   Hypokalemia 12/17/2020   Moderate persistent asthma 12/17/2020   Hypertension    CVA (cerebral vascular accident) ([place]) 12/04/2020   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   Family history of heart disease 09/03/2019   Mild intermittent asthma 07/25/2019   Osteoporosis 06/12/2019   Elevated LFTs 06/06/2019   Hematuria 06/06/2019   Elevated IgE level 09/12/2017   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) 09/12/2017   Headache, migraine 02/15/2015   Vitamin D deficiency 10/19/2014   Encounter for preventive health examination 10/19/2014   Depression with anxiety 09/02/2014   Dyspnea on exertion 08/26/2014   Hyperlipidemia 08/18/2014   Hypothyroidism 08/18/2014   Obesity (BMI 30-39.9) 08/18/2014    Erie Radu, [place] 03/01/2021, 3:27 PM  [facility] [address], [place], [place], [address] Phone: [phone]   Fax:  [phone]   Name: Tiffany Horn MRN: [id number] Date of Birth: 10-Jan-1961

## 2021-03-02 ENCOUNTER — Telehealth: Payer: Self-pay

## 2021-03-02 NOTE — Telephone Encounter (Signed)
Patient refill request. Patient also requesting to make next visit a virtual appt.  Is this okay to schedule with patient?  [facility]  clonazePAM (KLONOPIN) 0.5 MG tablet [[id number]]

## 2021-03-02 NOTE — Telephone Encounter (Signed)
Spoke with patient regarding results/recommendations. Pt scheduled for 03/07/21

## 2021-03-04 ENCOUNTER — Other Ambulatory Visit: Payer: Self-pay

## 2021-03-07 ENCOUNTER — Other Ambulatory Visit: Payer: Self-pay

## 2021-03-07 ENCOUNTER — Telehealth: Payer: Self-pay | Admitting: Family Medicine

## 2021-03-07 ENCOUNTER — Ambulatory Visit (INDEPENDENT_AMBULATORY_CARE_PROVIDER_SITE_OTHER): Payer: BC Managed Care – PPO | Admitting: Family Medicine

## 2021-03-07 ENCOUNTER — Encounter: Payer: Self-pay | Admitting: Family Medicine

## 2021-03-07 VITALS — BP 127/88 | HR 96 | Temp 98.2°F | Resp 12 | Ht 65.0 in | Wt 175.6 lb

## 2021-03-07 DIAGNOSIS — E039 Hypothyroidism, unspecified: Secondary | ICD-10-CM

## 2021-03-07 DIAGNOSIS — I725 Aneurysm of other precerebral arteries: Secondary | ICD-10-CM

## 2021-03-07 DIAGNOSIS — F419 Anxiety disorder, unspecified: Secondary | ICD-10-CM

## 2021-03-07 DIAGNOSIS — G43909 Migraine, unspecified, not intractable, without status migrainosus: Secondary | ICD-10-CM | POA: Diagnosis not present

## 2021-03-07 DIAGNOSIS — I639 Cerebral infarction, unspecified: Secondary | ICD-10-CM

## 2021-03-07 DIAGNOSIS — E669 Obesity, unspecified: Secondary | ICD-10-CM | POA: Diagnosis not present

## 2021-03-07 DIAGNOSIS — E782 Mixed hyperlipidemia: Secondary | ICD-10-CM

## 2021-03-07 DIAGNOSIS — M810 Age-related osteoporosis without current pathological fracture: Secondary | ICD-10-CM

## 2021-03-07 DIAGNOSIS — I10 Essential (primary) hypertension: Secondary | ICD-10-CM

## 2021-03-07 DIAGNOSIS — Z8249 Family history of ischemic heart disease and other diseases of the circulatory system: Secondary | ICD-10-CM

## 2021-03-07 DIAGNOSIS — F330 Major depressive disorder, recurrent, mild: Secondary | ICD-10-CM

## 2021-03-07 LAB — T4, FREE: Free T4: 0.58 ng/dL — ABNORMAL LOW (ref 0.60–1.60)

## 2021-03-07 LAB — TSH: TSH: 10.26 u[IU]/mL — ABNORMAL HIGH (ref 0.35–5.50)

## 2021-03-07 MED ORDER — RIZATRIPTAN BENZOATE 5 MG PO TABS: 5.0000 mg | ORAL_TABLET | Freq: Every day | ORAL | 5 refills | Status: DC | PRN

## 2021-03-07 MED ORDER — LEVOTHYROXINE SODIUM 125 MCG PO TABS: 125.0000 ug | ORAL_TABLET | Freq: Every day | ORAL | 3 refills | Status: DC

## 2021-03-07 MED ORDER — ATORVASTATIN CALCIUM 80 MG PO TABS: 80.0000 mg | ORAL_TABLET | Freq: Every day | ORAL | 1 refills | Status: DC

## 2021-03-07 MED ORDER — AMLODIPINE BESYLATE 10 MG PO TABS: 10.0000 mg | ORAL_TABLET | Freq: Every day | ORAL | 1 refills | Status: DC

## 2021-03-07 MED ORDER — CLONAZEPAM 0.5 MG PO TABS: 0.5000 mg | ORAL_TABLET | Freq: Three times a day (TID) | ORAL | 5 refills | Status: DC | PRN

## 2021-03-07 MED ORDER — PAROXETINE HCL 40 MG PO TABS: 40.0000 mg | ORAL_TABLET | Freq: Every day | ORAL | 1 refills | Status: DC

## 2021-03-07 NOTE — Telephone Encounter (Signed)
Patient notified of note below and she will call back to schedule lab only appointment.

## 2021-03-07 NOTE — Telephone Encounter (Signed)
Please inform patient her thyroid is under replaced. I have called in an increased dose of levothyroxine 125 mcg daily on an empty stomach for her to start.  We will need to collect a lab appointment only in 8 weeks to ensure her levels are in normal range.  Please have her schedule for future lab.

## 2021-03-07 NOTE — Progress Notes (Signed)
This visit occurred during the SARS-CoV-2 public health emergency.  Safety protocols were in place, including screening questions prior to the visit, additional usage of staff PPE, and extensive cleaning of exam room while observing appropriate contact time as indicated for disinfecting solutions.    Tiffany Horn , 1960-12-03, 61 y.o., female MRN: [id number] Patient Care Team    Relationship Specialty Notifications Start End  [name], DO PCP - General Family Medicine  04/28/20   Jerline Pain, MD PCP - Cardiology Cardiology  03/01/20   [name], MD Consulting Physician Gastroenterology  05/29/16   [name], CNM Referring Physician Certified Nurse Midwife  05/29/16   Gari Crown, MD Referring Physician Obstetrics and Gynecology  07/17/17   Garner Nash, DO Consulting Physician Pulmonary Disease  06/12/19     Chief Complaint  Patient presents with   Medication Refill     Subjective: Pt presents for an OV to follow-up on chronic medical conditions. Hypothyroidism, unspecified type Patient reports compliance with levothyroxine 125 mcg daily.   Migraine without status migrainosus, not intractable, unspecified migraine type She reports her migraines are decently controlled on the Maxalt.  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HCC)/Cerebrovascular accident (CVA), unspecified mechanism ([place]) Patient unfortunately has suffered from a CVA and diagnosed with the aneurysm.  She is following with neurology closely.  She is compliant with baby aspirin.  Primary hypertension/hyperlipidemia/family history of heart disease/obesity-morbid BMI greater than 30 Pt reports compliance with amlodipine 10 mg daily and atorvastatin 80 mg nightly. Blood pressures ranges at home not routinely checked. Patient denies chest pain, shortness of breath or lower extremity edema.  RF: Pretension, hyperlipidemia, CVA, family history of heart disease.    Anxiety/major depression Patient  had been seeing a psychologist and prescribed Paxil 40 mg daily and Klonopin 0.5 mg 3 times daily as needed.  Patient states she has not seen her psychologist in some time and would rather receive medications through this office.  Osteoporosis, unspecified osteoporosis type, unspecified pathological fracture presence/vitamin D deficiency Patient reports supplementing with vitamin D.  DEXA 12/08/2019 [facility] [place]. -2.6   Depression screen [facility] 2/9 03/07/2021 11/25/2020 05/05/2020 05/21/2019 03/06/2017  Decreased Interest 0 0 0 3 2  Down, Depressed, Hopeless 0 0 0 3 2  PHQ - 2 Score 0 0 0 6 4  Altered sleeping - - - 1 2  Tired, decreased energy - - - 3 3  Change in appetite - - - 1 0  Feeling bad or failure about yourself  - - - 0 0  Trouble concentrating - - - 0 1  Moving slowly or fidgety/restless - - - 2 0  Suicidal thoughts - - - 0 0  PHQ-9 Score - - - 13 10  Difficult doing work/chores - - - Somewhat difficult -  Some recent data might be hidden    Allergies  Allergen Reactions   Abilify [Aripiprazole] Nausea And Vomiting   Amoxicillin Diarrhea   Promethazine Hcl    Seroquel [Quetiapine Fumarate]     Unknown   Social History   Social History Narrative   She is originally from [place]. She has traveled to [facility], [place], [place], [place], [place], [place], [place], [place], [place]. No international travel. She has dogs. No prior bird, mold, or recent hot tub exposure. She hasn't used her hot tub in 1.5 years. 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 Previously enjoyed  gardening and playing tennis. Helps to care for her mother.   Past Medical History:  Diagnosis Date   Allergy    Anxiety    Arthritis    knees and spine, shoulder   Asthma    Bronchitis    hx - recurrent   Complication of anesthesia    waking up is not easy   Depression    Elevated IgE level 09/12/2017   09/12/2017 IgE 195   H/O miscarriage, not currently pregnant    Hx of irritable  bowel syndrome    x2   Hyperlipidemia    diet controlled - no medication   Hypertension    not taking any meds at present - under control per patient   Hypokalemia    with PNA admission (2.5)   Ischemic cerebrovascular accident (CVA) ([place])    Migraines    Pneumonia    4 episodes; hosp. admission 2014   PONV (postoperative nausea and vomiting)    Thyroid disease    TIA (transient ischemic attack) 12/17/2020   UC (ulcerative colitis) [facility])    DX'D 2021   Past Surgical History:  Procedure Laterality Date   BREAST SURGERY     implants, then had them removed   COLONOSCOPY     greater 10 yrs ago - ? [facility]-2017 LAST   DILATION AND CURETTAGE OF UTERUS     IR ANGIO INTRA EXTRACRAN SEL INTERNAL CAROTID BILAT MOD SED  11/19/2020   IR ANGIO VERTEBRAL SEL VERTEBRAL BILAT MOD SED  11/19/2020   IR ANGIO VERTEBRAL SEL VERTEBRAL UNI L MOD SED  02/25/2021   IR ANGIOGRAM FOLLOW UP STUDY  11/19/2020   IR ANGIOGRAM FOLLOW UP STUDY  11/19/2020   IR ANGIOGRAM FOLLOW UP STUDY  11/19/2020   IR ANGIOGRAM FOLLOW UP STUDY  11/19/2020   IR ANGIOGRAM FOLLOW UP STUDY  11/19/2020   IR ANGIOGRAM FOLLOW UP STUDY  11/19/2020   IR ANGIOGRAM FOLLOW UP STUDY  11/19/2020   IR ANGIOGRAM FOLLOW UP STUDY  11/19/2020   IR INTRA CRAN STENT  11/19/2020   IR TRANSCATH/EMBOLIZ  11/19/2020   LAPAROSCOPIC ABDOMINAL EXPLORATION  01/31/1992   endometriosis   ORIF HUMERUS FRACTURE Left 04/01/2013   DR Ninfa Linden - shoulder   ORIF HUMERUS FRACTURE Left 04/01/2013   Procedure: OPEN REDUCTION INTERNAL FIXATION (ORIF) LEFT PROXIMAL HUMERUS FRACTURE;  Surgeon: Mcarthur Rossetti, MD;  Location: [place];  Service: Orthopedics;  Laterality: Left;   RADIOLOGY WITH ANESTHESIA N/A 11/19/2020   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;  Surgeon: Consuella Lose, MD;  Location: [place];  Service: Radiology;  Laterality: N/A;   TONSILLECTOMY AND ADENOIDECTOMY     Family History  Problem Relation Age of  Onset   Breast cancer Mother    Diabetes Mother    Heart disease Mother    Diabetes Father    Dementia Father    Colon cancer Father    Stroke Paternal Grandmother    Diabetes Paternal Grandmother    Colon polyps Neg Hx    Esophageal cancer Neg Hx    Stomach cancer Neg Hx    Rectal cancer Neg Hx    Allergies as of 03/07/2021       Reactions   Abilify [aripiprazole] Nausea And Vomiting   Amoxicillin Diarrhea   Promethazine Hcl    Seroquel [quetiapine Fumarate]    Unknown        Medication List        Accurate as of March 07, 2021 11:59 PM. If you have any questions, ask your nurse or doctor.          STOP taking these medications    ezetimibe 10 MG tablet Commonly known as: ZETIA Stopped by: Howard Pouch, DO   levETIRAcetam 500 MG tablet Commonly known as: Keppra Stopped by: Howard Pouch, DO   LORazepam 0.5 MG tablet Commonly known as: ATIVAN Stopped by: Howard Pouch, DO   MAGNESIUM GLYCINATE PO Stopped by: Howard Pouch, DO   ondansetron 4 MG tablet Commonly known as: ZOFRAN Stopped by: Howard Pouch, DO   Spacer/Aero-Holding Chambers Kerrin Mo Stopped by: Howard Pouch, DO       TAKE these medications    amLODipine 10 MG tablet Commonly known as: NORVASC Take 1 tablet (10 mg total) by mouth daily.   aspirin 81 MG chewable tablet Chew 1 tablet (81 mg total) by mouth daily.   atorvastatin 80 MG tablet Commonly known as: LIPITOR Take 1 tablet (80 mg total) by mouth daily.   B COMPLEX PO Take 1 tablet by mouth daily.   Breztri Aerosphere 160-9-4.8 MCG/ACT Aero Generic drug: Budeson-Glycopyrrol-Formoterol Inhale 2 puffs into the lungs in the morning and at bedtime.   clonazePAM 0.5 MG tablet Commonly known as: KLONOPIN Take 1 tablet (0.5 mg total) by mouth 3 (three) times daily as needed for anxiety.   fluticasone furoate-vilanterol 100-25 MCG/ACT Aepb Commonly known as: BREO ELLIPTA Inhale 1 puff into the lungs daily as needed.   Incruse  Ellipta 62.5 MCG/ACT Aepb Generic drug: umeclidinium bromide Inhale 1 puff into the lungs daily.   levothyroxine 125 MCG tablet Commonly known as: SYNTHROID Take 1 tablet (125 mcg total) by mouth daily. On an empty stomach. What changed:  medication strength how much to take additional instructions Changed by: Howard Pouch, DO   PARoxetine 40 MG tablet Commonly known as: PAXIL Take 1 tablet (40 mg total) by mouth daily.   rizatriptan 5 MG tablet Commonly known as: MAXALT Take 1 tablet (5 mg total) by mouth daily as needed for migraine.   ticagrelor 90 MG Tabs tablet Commonly known as: BRILINTA Take 1 tablet (90 mg total) by mouth 2 (two) times daily.   vitamin C 1000 MG tablet Take 1,000 mg by mouth daily.        All past medical history, surgical history, allergies, family history, immunizations andmedications were updated in the EMR today and reviewed under the history and medication portions of their EMR.     ROS Negative, with the exception of above mentioned in HPI   Objective:  BP 127/88 (BP Location: Right Arm, Cuff Size: Normal)    Pulse 96    Temp 98.2 F (36.8 C) (Oral)    Resp 12    Ht 5\' 5"  (1.651 m)    Wt 175 lb 9.6 oz (79.7 kg)    LMP 01/31/2007    SpO2 99%    BMI 29.22 kg/m  Body mass index is 29.22 kg/m. Physical Exam Vitals and nursing note reviewed.  Constitutional:      General: She is not in acute distress.    Appearance: Normal appearance. She is obese. She is not ill-appearing, toxic-appearing or diaphoretic.  HENT:     Head: Normocephalic and atraumatic.     Mouth/Throat:     Mouth: Mucous membranes are moist.  Eyes:     General: No scleral icterus.       Right eye: No discharge.        Left eye: No  discharge.     Extraocular Movements: Extraocular movements intact.     Conjunctiva/sclera: Conjunctivae normal.     Pupils: Pupils are equal, round, and reactive to light.  Cardiovascular:     Rate and Rhythm: Normal rate and regular  rhythm.     Heart sounds: No murmur heard.   No friction rub. No gallop.  Pulmonary:     Effort: Pulmonary effort is normal. No respiratory distress.     Breath sounds: Normal breath sounds. No wheezing, rhonchi or rales.  Musculoskeletal:     Cervical back: Neck supple.     Right lower leg: No edema.     Left lower leg: No edema.  Skin:    General: Skin is warm and dry.     Coloration: Skin is not jaundiced or pale.     Findings: No erythema or rash.  Neurological:     Mental Status: She is alert and oriented to person, place, and time. Mental status is at baseline.     Motor: No weakness.     Gait: Gait normal.  Psychiatric:        Mood and Affect: Mood normal.        Behavior: Behavior normal.        Thought Content: Thought content normal.        Judgment: Judgment normal.     No results found. No results found. No results found for this or any previous visit (from the past 24 hour(s)).  Assessment/Plan: Tiffany Horn is a 61 y.o. female present for OV for  Hypothyroidism, unspecified type Continue levothyroxine 125 mcg daily. TSH, T4 free  Migraine without status migrainosus, not intractable, unspecified migraine type Migraines are stable, continue Maxalt  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HCC)/Cerebrovascular accident (CVA), unspecified mechanism ([place]) Continue ASA Continue follow-up with neurology  Primary hypertension/hyperlipidemia/family history of heart disease/obesity-morbid BMI greater than 30 Stable Continue amlodipine 10 mg daily Continue atorvastatin 80 mg in the evening  Anxiety/major depression Stable. Continue Paxil 40 mg daily Agreed to take over her Klonopin prescription.  Patient understands she will need face-to-face follow-up for any refills every 6 months before running out of medications. Contract/UDS will be updated next visit.  Osteoporosis, unspecified osteoporosis type, unspecified pathological fracture presence/vitamin D deficiency Dexa  due 2023-202 Continue vitamin D supplement   Reviewed expectations re: course of current medical issues. Discussed self-management of symptoms. Outlined signs and symptoms indicating need for more acute intervention. Patient verbalized understanding and all questions were answered. Patient received an After-Visit Summary.    Orders Placed This Encounter  Procedures   T4, free   TSH   Meds ordered this encounter  Medications   rizatriptan (MAXALT) 5 MG tablet    Sig: Take 1 tablet (5 mg total) by mouth daily as needed for migraine.    Dispense:  10 tablet    Refill:  5   amLODipine (NORVASC) 10 MG tablet    Sig: Take 1 tablet (10 mg total) by mouth daily.    Dispense:  90 tablet    Refill:  1   atorvastatin (LIPITOR) 80 MG tablet    Sig: Take 1 tablet (80 mg total) by mouth daily.    Dispense:  90 tablet    Refill:  1   PARoxetine (PAXIL) 40 MG tablet    Sig: Take 1 tablet (40 mg total) by mouth daily.    Dispense:  90 tablet    Refill:  1   clonazePAM (KLONOPIN) 0.5 MG tablet  Sig: Take 1 tablet (0.5 mg total) by mouth 3 (three) times daily as needed for anxiety.    Dispense:  90 tablet    Refill:  5   Referral Orders  No referral(s) requested today     Note is dictated utilizing voice recognition software. Although note has been proof read prior to signing, occasional typographical errors still can be missed. If any questions arise, please do not hesitate to call for verification.   electronically signed by:  Howard Pouch, DO  [place]

## 2021-03-14 ENCOUNTER — Encounter: Payer: Self-pay | Admitting: Family Medicine

## 2021-03-16 ENCOUNTER — Encounter: Payer: Self-pay | Admitting: Obstetrics and Gynecology

## 2021-03-16 ENCOUNTER — Other Ambulatory Visit (HOSPITAL_COMMUNITY)
Admission: RE | Admit: 2021-03-16 | Discharge: 2021-03-16 | Disposition: A | Discharge status: Home / Self Care | Payer: BC Managed Care – PPO | Source: Ambulatory Visit | Attending: Obstetrics and Gynecology | Admitting: Obstetrics and Gynecology

## 2021-03-16 ENCOUNTER — Other Ambulatory Visit: Payer: Self-pay

## 2021-03-16 ENCOUNTER — Ambulatory Visit (INDEPENDENT_AMBULATORY_CARE_PROVIDER_SITE_OTHER): Payer: BC Managed Care – PPO | Admitting: Obstetrics and Gynecology

## 2021-03-16 VITALS — BP 118/78 | HR 105 | Ht 65.25 in | Wt 171.0 lb

## 2021-03-16 DIAGNOSIS — Z01419 Encounter for gynecological examination (general) (routine) without abnormal findings: Secondary | ICD-10-CM | POA: Diagnosis not present

## 2021-03-16 DIAGNOSIS — Z8619 Personal history of other infectious and parasitic diseases: Secondary | ICD-10-CM

## 2021-03-16 DIAGNOSIS — Z124 Encounter for screening for malignant neoplasm of cervix: Secondary | ICD-10-CM | POA: Insufficient documentation

## 2021-03-16 NOTE — Patient Instructions (Signed)
Please contact your primary care provider for conversation about treatment of your osteoporosis.   EXERCISE AND DIET:  We recommended that you start or continue a regular exercise program for good health. Regular exercise means any activity that makes your heart beat faster and makes you sweat.  We recommend exercising at least 30 minutes per day at least 3 days a week, preferably 4 or 5.  We also recommend a diet low in fat and sugar.  Inactivity, poor dietary choices and obesity can cause diabetes, heart attack, stroke, and kidney damage, among others.    ALCOHOL AND SMOKING:  Women should limit their alcohol intake to no more than 7 drinks/beers/glasses of wine (combined, not each!) per week. Moderation of alcohol intake to this level decreases your risk of breast cancer and liver damage. And of course, no recreational drugs are part of a healthy lifestyle.  And absolutely no smoking or even second hand smoke. Most people know smoking can cause heart and lung diseases, but did you know it also contributes to weakening of your bones? Aging of your skin?  Yellowing of your teeth and nails?  CALCIUM AND VITAMIN D:  Adequate intake of calcium and Vitamin D are recommended.  The recommendations for exact amounts of these supplements seem to change often, but generally speaking 600 mg of calcium (either carbonate or citrate) and 800 units of Vitamin D per day seems prudent. Certain women may benefit from higher intake of Vitamin D.  If you are among these women, your doctor will have told you during your visit.    PAP SMEARS:  Pap smears, to check for cervical cancer or precancers,  have traditionally been done yearly, although recent scientific advances have shown that most women can have pap smears less often.  However, every woman still should have a physical exam from her gynecologist every year. It will include a breast check, inspection of the vulva and vagina to check for abnormal growths or skin  changes, a visual exam of the cervix, and then an exam to evaluate the size and shape of the uterus and ovaries.  And after 59 years of age, a rectal exam is indicated to check for rectal cancers. We will also provide age appropriate advice regarding health maintenance, like when you should have certain vaccines, screening for sexually transmitted diseases, bone density testing, colonoscopy, mammograms, etc.   MAMMOGRAMS:  All women over 59 years old should have a yearly mammogram. Many facilities now offer a "3D" mammogram, which may cost around $50 extra out of pocket. If possible,  we recommend you accept the option to have the 3D mammogram performed.  It both reduces the number of women who will be called back for extra views which then turn out to be normal, and it is better than the routine mammogram at detecting truly abnormal areas.    COLONOSCOPY:  Colonoscopy to screen for colon cancer is recommended for all women at age 21.  We know, you hate the idea of the prep.  We agree, BUT, having colon cancer and not knowing it is worse!!  Colon cancer so often starts as a polyp that can be seen and removed at colonscopy, which can quite literally save your life!  And if your first colonoscopy is normal and you have no family history of colon cancer, most women don't have to have it again for 10 years.  Once every ten years, you can do something that may end up saving your life, right?  We will be happy to help you get it scheduled when you are ready.  Be sure to check your insurance coverage so you understand how much it will cost.  It may be covered as a preventative service at no cost, but you should check your particular policy.

## 2021-03-16 NOTE — Progress Notes (Signed)
61 y.o. [id number] Divorced Caucasian female here for annual exam.    No vaginal bleeding.   Had brain surgery in October. Had coils and a stent placed.  Did PT and speech language therapy.  Having some stumbling and headaches.   States she was on Fosamax for a short period of time.   PCP:  Howard Pouch, DO   Patient's last menstrual period was 01/31/2007.           Sexually active: Yes.    The current method of family planning is post menopausal status.    Exercising: Yes.     Walking  Smoker:  no  Health Maintenance: Pap:  02-20-18 Neg:Neg HR HPV, 04-18-16 Neg:Neg HR HPV, 03-11-15 Neg:Pos HR HPV;Neg 16/18 History of abnormal Pap: No MMG:  07-08-20 Neg/Birads1 Colonoscopy:  09-20-20 polyps;next 2 years BMD:  12/08/19 Result :Osteoporosis.  PCP following.  TDaP:  2016 Gardasil:   n/a HIV: 12-17-20 NR Hep C: 2016 Neg Screening Labs:  PCP   reports that she has never smoked. She has never been exposed to tobacco smoke. She has never used smokeless tobacco. She reports current alcohol use of about 1.0 standard drink per week. She reports that she does not use drugs.  Past Medical History:  Diagnosis Date   Allergy    Anxiety    Arthritis    knees and spine, shoulder   Asthma    Bronchitis    hx - recurrent   Complication of anesthesia    waking up is not easy   Depression    Elevated IgE level 09/12/2017   09/12/2017 IgE 195   H/O miscarriage, not currently pregnant    History of COVID-19 12/30/2020   Hx of irritable bowel syndrome    x2   Hyperlipidemia    diet controlled - no medication   Hypertension    not taking any meds at present - under control per patient   Hypokalemia    with PNA admission (2.5)   Ischemic cerebrovascular accident (CVA) ([place])    Migraines    Pneumonia    4 episodes; hosp. admission 2014   PONV (postoperative nausea and vomiting)    Thyroid disease    TIA (transient ischemic attack) 12/17/2020   UC (ulcerative colitis) [facility])    DX'D 2021     Past Surgical History:  Procedure Laterality Date   BREAST SURGERY     implants, then had them removed   COLONOSCOPY     greater 10 yrs ago - ? [facility]-2017 LAST   DILATION AND CURETTAGE OF UTERUS     IR ANGIO INTRA EXTRACRAN SEL INTERNAL CAROTID BILAT MOD SED  11/19/2020   IR ANGIO VERTEBRAL SEL VERTEBRAL BILAT MOD SED  11/19/2020   IR ANGIO VERTEBRAL SEL VERTEBRAL UNI L MOD SED  02/25/2021   IR ANGIOGRAM FOLLOW UP STUDY  11/19/2020   IR ANGIOGRAM FOLLOW UP STUDY  11/19/2020   IR ANGIOGRAM FOLLOW UP STUDY  11/19/2020   IR ANGIOGRAM FOLLOW UP STUDY  11/19/2020   IR ANGIOGRAM FOLLOW UP STUDY  11/19/2020   IR ANGIOGRAM FOLLOW UP STUDY  11/19/2020   IR ANGIOGRAM FOLLOW UP STUDY  11/19/2020   IR ANGIOGRAM FOLLOW UP STUDY  11/19/2020   IR INTRA CRAN STENT  11/19/2020   IR TRANSCATH/EMBOLIZ  11/19/2020   LAPAROSCOPIC ABDOMINAL EXPLORATION  01/31/1992   endometriosis   ORIF HUMERUS FRACTURE Left 04/01/2013   DR Ninfa Linden - shoulder   ORIF HUMERUS FRACTURE Left 04/01/2013  Procedure: OPEN REDUCTION INTERNAL FIXATION (ORIF) LEFT PROXIMAL HUMERUS FRACTURE;  Surgeon: Mcarthur Rossetti, MD;  Location: [facility];  Service: Orthopedics;  Laterality: Left;   RADIOLOGY WITH ANESTHESIA N/A 11/19/2020   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 90 tablet 1   Ascorbic Acid (VITAMIN C) 1000 MG tablet Take 1,000 mg by mouth daily.     aspirin 81 MG chewable tablet Chew 1 tablet (81 mg total) by mouth daily. 30 tablet 3   atorvastatin (LIPITOR) 80 MG tablet Take 1 tablet (80 mg total) by mouth daily. 90 tablet 1   B Complex Vitamins (B COMPLEX PO) Take 1 tablet by mouth daily.      Budeson-Glycopyrrol-Formoterol (BREZTRI AEROSPHERE) 160-9-4.8 MCG/ACT AERO Inhale 2 puffs into the lungs in the morning and at bedtime. 5.9 g 1   Cholecalciferol (VITAMIN D) 50 MCG (2000 UT) tablet Take 2,000 Units by mouth daily.     clonazePAM (KLONOPIN) 0.5 MG tablet Take 1 tablet (0.5 mg total) by mouth 3 (three) times daily as needed for anxiety. 90 tablet 5   fluticasone furoate-vilanterol (BREO ELLIPTA) 100-25 MCG/ACT AEPB Inhale 1 puff into the lungs daily as needed.     levothyroxine (SYNTHROID) 125 MCG tablet Take 1 tablet (125 mcg total) by mouth daily. On an empty stomach. 90 tablet 3   PARoxetine (PAXIL) 40 MG tablet Take 1 tablet (40 mg total) by mouth daily. 90 tablet 1   rizatriptan (MAXALT) 5 MG tablet Take 1 tablet (5 mg total) by mouth daily as needed for migraine. 10 tablet 5   ticagrelor (BRILINTA) 90 MG TABS tablet Take 1 tablet (90 mg total) by mouth 2 (two) times daily. 60 tablet 3   Current Facility-Administered Medications  Medication Dose Route Frequency Provider Last Rate Last Admin   0.9 %  sodium chloride infusion  500 mL Intravenous Once Nandigam, Venia Minks, MD        Family History  Problem Relation Age of Onset   Breast cancer Mother    Diabetes Mother    Heart disease Mother    Diabetes Father    Dementia Father    Colon cancer Father    Stroke Paternal Grandmother    Diabetes Paternal Grandmother    Colon polyps Neg Hx    Esophageal cancer Neg Hx    Stomach cancer Neg Hx    Rectal cancer Neg Hx     Review of Systems  All other systems reviewed and are negative.  Exam:   BP 118/78    Pulse (!) 105    Ht 5' 5.25" (1.657 m)    Wt 171 lb (77.6 kg)    LMP 01/31/2007    SpO2 98%    BMI 28.24 kg/m     General appearance: alert, cooperative and appears stated age Head: normocephalic, without obvious abnormality, atraumatic Neck: no adenopathy, supple, symmetrical, trachea midline and thyroid normal to inspection and palpation Lungs: clear to  auscultation bilaterally Breasts: normal appearance, no masses or tenderness, No nipple retraction or dimpling, No nipple discharge or bleeding, No axillary adenopathy Heart: regular rate and rhythm Abdomen: soft, non-tender; no masses, no organomegaly Extremities: extremities normal, atraumatic, no cyanosis or edema Skin: skin color,  texture, turgor normal. No rashes or lesions Lymph nodes: cervical, supraclavicular, and axillary nodes normal. Neurologic: grossly normal  Pelvic: External genitalia:  no lesions              No abnormal inguinal nodes palpated.              Urethra:  normal appearing urethra with no masses, tenderness or lesions              Bartholins and Skenes: normal                 Vagina: normal appearing vagina with normal color and discharge, no lesions              Cervix: no lesions              Pap taken: yes Bimanual Exam:  Uterus:  normal size, contour, position, consistency, mobility, non-tender              Adnexa: no mass, fullness, tenderness              Rectal exam: yes.  Confirms.              Anus:  normal sphincter tone, no lesions  Chaperone was present for exam:  Estill Bamberg, CMA  Assessment:   Well woman visit with gynecologic exam. Hx positive HR HPV testing. Osteoporosis.  Hx TIA/CVA. FH breast cancer in mother.  Plan: Mammogram screening discussed. Self breast awareness reviewed. Pap and HR HPV as above. Guidelines for Calcium, Vitamin D, regular exercise program including cardiovascular and weight bearing exercise. She will contact PCP regarding osteoporosis tx. Follow up annually and prn.   After visit summary provided.

## 2021-03-18 ENCOUNTER — Telehealth: Payer: Self-pay | Admitting: Neurology

## 2021-03-18 LAB — CYTOLOGY - PAP
Comment: NEGATIVE
Diagnosis: UNDETERMINED — AB
High risk HPV: NEGATIVE

## 2021-03-18 NOTE — Telephone Encounter (Signed)
Pt called wanting to inform the provider that yesterday she had a Kaleidescope vision and headache but is doing much better. Her Neurosurgeon advised her to inform her neurologist.

## 2021-03-28 ENCOUNTER — Ambulatory Visit: Payer: BC Managed Care – PPO | Admitting: Family Medicine

## 2021-03-31 ENCOUNTER — Telehealth: Payer: Self-pay | Admitting: Neurology

## 2021-03-31 NOTE — Telephone Encounter (Signed)
Reports third episode of the following symptoms: dizziness, blurred/double vision, quick onset of headache. Lasted around 4 minutes. Occurred 03/30/21.  ? ?She has continued her current medication regimen. Watching her diet, not stressed.  ? ?She is concerned about the repeated events. ? ?She would like a call from MD for advice before going into the weekend. Reluctant to proceed to ED. States in the past they order a CT then she is sent home. ? ?She is aware Dr. Leonie Man is not in the office. We will send this concern to the work-in MD, Dr. Billey Gosling. ? ?The patient can be reached at [phone]. ? ? ?

## 2021-03-31 NOTE — Telephone Encounter (Signed)
Pt has called to report that she had another episode on yesterday which lasted about 3 mins. Pt states the episode consisted of  double vision& blurry vision.  Pt would like a call to discuss. ?

## 2021-04-01 ENCOUNTER — Encounter (HOSPITAL_COMMUNITY): Payer: Self-pay | Admitting: *Deleted

## 2021-04-01 ENCOUNTER — Emergency Department (HOSPITAL_COMMUNITY): Payer: BC Managed Care – PPO

## 2021-04-01 ENCOUNTER — Emergency Department (HOSPITAL_COMMUNITY)
Admission: EM | Admit: 2021-04-01 | Discharge: 2021-04-01 | Disposition: A | Discharge status: Home / Self Care | Payer: BC Managed Care – PPO | Attending: Emergency Medicine | Admitting: Emergency Medicine

## 2021-04-01 ENCOUNTER — Other Ambulatory Visit: Payer: Self-pay

## 2021-04-01 DIAGNOSIS — E039 Hypothyroidism, unspecified: Secondary | ICD-10-CM | POA: Insufficient documentation

## 2021-04-01 DIAGNOSIS — R110 Nausea: Secondary | ICD-10-CM | POA: Insufficient documentation

## 2021-04-01 DIAGNOSIS — R519 Headache, unspecified: Secondary | ICD-10-CM | POA: Insufficient documentation

## 2021-04-01 DIAGNOSIS — Z79899 Other long term (current) drug therapy: Secondary | ICD-10-CM | POA: Insufficient documentation

## 2021-04-01 DIAGNOSIS — I10 Essential (primary) hypertension: Secondary | ICD-10-CM | POA: Insufficient documentation

## 2021-04-01 DIAGNOSIS — Z7982 Long term (current) use of aspirin: Secondary | ICD-10-CM | POA: Insufficient documentation

## 2021-04-01 DIAGNOSIS — H538 Other visual disturbances: Secondary | ICD-10-CM | POA: Insufficient documentation

## 2021-04-01 DIAGNOSIS — R42 Dizziness and giddiness: Secondary | ICD-10-CM | POA: Insufficient documentation

## 2021-04-01 DIAGNOSIS — I639 Cerebral infarction, unspecified: Secondary | ICD-10-CM | POA: Insufficient documentation

## 2021-04-01 LAB — DIFFERENTIAL
Abs Immature Granulocytes: 0.01 10*3/uL (ref 0.00–0.07)
Basophils Absolute: 0.1 10*3/uL (ref 0.0–0.1)
Basophils Relative: 1 %
Eosinophils Absolute: 0.1 10*3/uL (ref 0.0–0.5)
Eosinophils Relative: 3 %
Immature Granulocytes: 0 %
Lymphocytes Relative: 34 %
Lymphs Abs: 1.8 10*3/uL (ref 0.7–4.0)
Monocytes Absolute: 0.4 10*3/uL (ref 0.1–1.0)
Monocytes Relative: 8 %
Neutro Abs: 2.8 10*3/uL (ref 1.7–7.7)
Neutrophils Relative %: 54 %

## 2021-04-01 LAB — COMPREHENSIVE METABOLIC PANEL
ALT: 18 U/L (ref 0–44)
AST: 19 U/L (ref 15–41)
Albumin: 4.1 g/dL (ref 3.5–5.0)
Alkaline Phosphatase: 65 U/L (ref 38–126)
Anion gap: 7 (ref 5–15)
BUN: 9 mg/dL (ref 6–20)
CO2: 27 mmol/L (ref 22–32)
Calcium: 9.3 mg/dL (ref 8.9–10.3)
Chloride: 105 mmol/L (ref 98–111)
Creatinine, Ser: 0.76 mg/dL (ref 0.44–1.00)
GFR, Estimated: >60 mL/min (ref >60)
Glucose, Bld: 101 mg/dL — ABNORMAL HIGH (ref 70–99)
Potassium: 4 mmol/L (ref 3.5–5.1)
Sodium: 139 mmol/L (ref 135–145)
Total Bilirubin: 0.5 mg/dL (ref 0.3–1.2)
Total Protein: 7.2 g/dL (ref 6.5–8.1)

## 2021-04-01 LAB — CBC
HCT: 43.3 % (ref 36.0–46.0)
Hemoglobin: 14.1 g/dL (ref 12.0–15.0)
MCH: 29.9 pg (ref 26.0–34.0)
MCHC: 32.6 g/dL (ref 30.0–36.0)
MCV: 91.7 fL (ref 80.0–100.0)
Platelets: 240 10*3/uL (ref 150–400)
RBC: 4.72 MIL/uL (ref 3.87–5.11)
RDW: 12.4 % (ref 11.5–15.5)
WBC: 5.3 10*3/uL (ref 4.0–10.5)
nRBC: 0 % (ref 0.0–0.2)

## 2021-04-01 LAB — URINALYSIS, ROUTINE W REFLEX MICROSCOPIC
Bilirubin Urine: NEGATIVE
Glucose, UA: NEGATIVE mg/dL
Hgb urine dipstick: NEGATIVE
Ketones, ur: NEGATIVE mg/dL
Nitrite: NEGATIVE
Protein, ur: NEGATIVE mg/dL
Specific Gravity, Urine: 1.009 (ref 1.005–1.030)
pH: 7 (ref 5.0–8.0)

## 2021-04-01 LAB — I-STAT CHEM 8, ED
BUN: 11 mg/dL (ref 6–20)
Calcium, Ion: 1.19 mmol/L (ref 1.15–1.40)
Chloride: 107 mmol/L (ref 98–111)
Creatinine, Ser: 0.7 mg/dL (ref 0.44–1.00)
Glucose, Bld: 97 mg/dL (ref 70–99)
HCT: 41 % (ref 36.0–46.0)
Hemoglobin: 13.9 g/dL (ref 12.0–15.0)
Potassium: 4.3 mmol/L (ref 3.5–5.1)
Sodium: 140 mmol/L (ref 135–145)
TCO2: 28 mmol/L (ref 22–32)

## 2021-04-01 LAB — I-STAT BETA HCG BLOOD, ED (MC, WL, AP ONLY): I-stat hCG, quantitative: <5 m[IU]/mL (ref <5)

## 2021-04-01 LAB — PROTIME-INR
INR: 1 (ref 0.8–1.2)
Prothrombin Time: 12.7 seconds (ref 11.4–15.2)

## 2021-04-01 LAB — APTT: aPTT: 30 seconds (ref 24–36)

## 2021-04-01 MED ORDER — KETOROLAC TROMETHAMINE 15 MG/ML IJ SOLN
15.0000 mg | Freq: Once | INTRAMUSCULAR | Status: AC
  Administered 2021-04-01: 15 mg via INTRAVENOUS
  Filled 2021-04-01: qty 1

## 2021-04-01 MED ORDER — METOCLOPRAMIDE HCL 5 MG/ML IJ SOLN
10.0000 mg | Freq: Once | INTRAMUSCULAR | Status: AC
  Administered 2021-04-01: 10 mg via INTRAVENOUS
  Filled 2021-04-01: qty 2

## 2021-04-01 MED ORDER — IOHEXOL 350 MG/ML SOLN
75.0000 mL | Freq: Once | INTRAVENOUS | Status: AC | PRN
  Administered 2021-04-01: 75 mL via INTRAVENOUS

## 2021-04-01 MED ORDER — DIPHENHYDRAMINE HCL 50 MG/ML IJ SOLN
50.0000 mg | Freq: Once | INTRAMUSCULAR | Status: AC
  Administered 2021-04-01: 50 mg via INTRAVENOUS
  Filled 2021-04-01: qty 1

## 2021-04-01 MED ORDER — LORAZEPAM 1 MG PO TABS: 0.5000 mg | ORAL_TABLET | Freq: Once | ORAL | Status: DC

## 2021-04-01 MED ORDER — ONDANSETRON HCL 4 MG/2ML IJ SOLN
4.0000 mg | Freq: Once | INTRAMUSCULAR | Status: AC
  Administered 2021-04-01: 4 mg via INTRAVENOUS
  Filled 2021-04-01: qty 2

## 2021-04-01 NOTE — ED Provider Triage Note (Signed)
Emergency Medicine Provider Triage Evaluation Note ? ?Tiffany Horn , a 61 y.o. female  was evaluated in triage.  Pt complains of episodic dizziness, vision changes, and HA over the last few days.  Today's episode has been fluctuating since 9:30 AM.  Vision changes described as blurred vision or diplopia.  Headache centralized between brow bones.  Also feels unbalanced.  Had recent episode of this a few days ago that only lasted for minutes and relieved on own.  Hx of brain surgery in September and previous CVA.  Not on anticoags. ? ?Review of Systems  ?Positive: As above ?Negative: As above  ? ?Physical Exam  ?BP (!) 142/96 (BP Location: Right Arm)   Pulse 70   Temp 98.5 ?F (36.9 ?C) (Oral)   Resp 18   LMP 01/31/2007   SpO2 98%  ?Gen:   Awake, no distress   ?Resp:  Normal effort, CTAB ?MSK:   Moves extremities without difficulty  ?Other:  Neuro exam normal, no facial asymmetry, no obvious focal deficit observed, subjective blurred vision and HA, gait observed as cautious but normal ? ?Medical Decision Making  ?Medically screening exam initiated at 12:00 PM.  Appropriate orders placed.  Hudson Lehmkuhl was informed that the remainder of the evaluation will be completed by another provider, this initial triage assessment does not replace that evaluation, and the importance of remaining in the ED until their evaluation is complete. ? ?Labs and imaging EKG ordered ?  ?Prince Rome, PA-C ?01/65/53 1215 ? ?

## 2021-04-01 NOTE — Telephone Encounter (Signed)
Pt had not listen to vm. Relayed message to pt. She verbalized understand. ?

## 2021-04-01 NOTE — ED Triage Notes (Addendum)
Pt reports hx of brain surgery in Oct. Since then, has had multiple episodes of similar symtpoms and has been admitted with full workup. Had short episode on wed that lasted approx 2 mins, today onset 8:30 of dizziness, nausea, unsteady gait and blurred vision. Grips equal, no arm drift, no facial droop, speech is clear. ?

## 2021-04-01 NOTE — Telephone Encounter (Signed)
I called the patient back and left a detailed message on her voicemail (ok per DPR). I provided the message below from Dr. Billey Gosling. Stressed the importance of early evaluation during this type of episode. Best to go to ED to be on the safe side. Provided our number to call back with any questions.  ?

## 2021-04-01 NOTE — Telephone Encounter (Signed)
It sounds like she had similar symptoms with her prior strokes. If she's continuing to have episodes she should go to the ED for evaluation. Unfortunately there's nothing we can do in the office to urgently evaluate her symptoms.

## 2021-04-01 NOTE — ED Notes (Signed)
Patient transported to CT 

## 2021-04-01 NOTE — ED Provider Notes (Signed)
Medical screening examination/treatment/procedure(s) were conducted as a shared visit with non-physician practitioner(s) and myself.  I personally evaluated the patient during the encounter. ? ?Clinical Impression:  ? ?Final diagnoses:  ?Dizziness  ?Acute nonintractable headache, unspecified headache type  ?Blurry vision, bilateral  ? ? ?This patient is a 61 year old female presenting from home at the request of her neurologist.  She states that on Wednesday she had a short episode that involved headache blurred vision feeling hot and flushed nauseated and dizzy.  It seemed to resolve spontaneously but occurred acutely again this morning approximately 4 hours ago.  She no longer has any dizziness but still is nauseated has a headache and blurred vision.  Headache is about 6 out of 10.  On my exam the patient has no nystagmus, she does have bilateral decreased visual acuity.  There is not appear to be a peripheral visual field defect. ? ?Cardiac and pulmonary exams are unremarkable, she is able to lift all 4 extremities and has normal finger-nose-finger, normal strength in all 4 extremities normal grips normal sensation normal mental status and level of alertness. ? ?CT scan labs are unremarkable, neurology will be consulted for further management as this patient has had prior stroke that could have been causative in the past and may be causative now especially if this is posterior circulation. ? ?Headache cocktail ordered ? ? EKG Interpretation ? ?Date/Time:  Friday April 01 2021 11:52:45 EST ?Ventricular Rate:  74 ?PR Interval:  154 ?QRS Duration: 74 ?QT Interval:  380 ?QTC Calculation: 421 ?R Axis:   60 ?Text Interpretation: Normal sinus rhythm Normal ECG When compared with ECG of 10-Jan-2021 11:16, PREVIOUS ECG IS PRESENT Confirmed by Noemi Chapel [id number]) on 04/01/2021 12:31:19 PM ?  ? ?  ? ? ?  ?Noemi Chapel, MD ?04/03/21 [phone] ? ?

## 2021-04-01 NOTE — ED Provider Notes (Signed)
[facility] EMERGENCY DEPARTMENT Provider Note   CSN: [id number] Arrival date & time: 04/01/21  1111     History  Chief Complaint  Patient presents with   Dizziness    Tiffany Horn is a 61 y.o. female. The patient presents to the emergency department complaining of episodes of dizziness, vision changes, and headache ongoing intermittently since Wednesday. Today's episodes began around 9:30 while at work. She experienced a headache that was located across the front of her forehead, nausea, and blurry vision. She states that she is still experiencing those symptoms.  The patient called her neurologist, Dr. Billey Gosling, who recommended ED visit for evaluation. The patient had prior strokes and CVA with similar symptoms. 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 TTE without significant finding. Patient currently on Brilinta.   HPI     Home Medications Prior to Admission medications   Medication Sig Start Date End Date Taking? Authorizing Provider  amLODipine (NORVASC) 10 MG tablet Take 1 tablet (10 mg total) by mouth daily. 03/07/21   Kuneff, Renee A, DO  Ascorbic Acid (VITAMIN C) 1000 MG tablet Take 1,000 mg by mouth daily.    [provider]  aspirin 81 MG chewable tablet Chew 1 tablet (81 mg total) by mouth daily. 11/22/20   Consuella Lose, MD  atorvastatin (LIPITOR) 80 MG tablet Take 1 tablet (80 mg total) by mouth daily. 03/07/21   Kuneff, Renee A, DO  B Complex Vitamins (B COMPLEX PO) Take 1 tablet by mouth daily.    [provider]  Budeson-Glycopyrrol-Formoterol (BREZTRI AEROSPHERE) 160-9-4.8 MCG/ACT AERO Inhale 2 puffs into the lungs in the morning and at bedtime. 04/28/20   Garner Nash, DO  Cholecalciferol (VITAMIN D) 50 MCG (2000 UT) tablet Take 2,000 Units  by mouth daily.    [provider]  clonazePAM (KLONOPIN) 0.5 MG tablet Take 1 tablet (0.5 mg total) by mouth 3 (three) times daily as needed for anxiety. 03/07/21   Kuneff, Renee A, DO  fluticasone furoate-vilanterol (BREO ELLIPTA) 100-25 MCG/ACT AEPB Inhale 1 puff into the lungs daily as needed.    [provider]  levothyroxine (SYNTHROID) 125 MCG tablet Take 1 tablet (125 mcg total) by mouth daily. On an empty stomach. 03/07/21   Kuneff, Renee A, DO  PARoxetine (PAXIL) 40 MG tablet Take 1 tablet (40 mg total) by mouth daily. 03/07/21   Kuneff, Renee A, DO  rizatriptan (MAXALT) 5 MG tablet Take 1 tablet (5 mg total) by mouth daily as needed for migraine. 03/07/21   Kuneff, Renee A, DO  ticagrelor (BRILINTA) 90 MG TABS tablet Take 1 tablet (90 mg total) by mouth 2 (two) times daily. 11/22/20   Consuella Lose, MD      Allergies    Abilify [aripiprazole], Amoxicillin, Promethazine hcl, and Seroquel [quetiapine fumarate]    Review of Systems   Review of Systems  Constitutional:  Negative for diaphoresis, fatigue and fever.  Respiratory:  Negative for shortness of breath.   Cardiovascular:  Negative for chest pain and palpitations.  Gastrointestinal:  Positive for nausea. Negative for abdominal pain, diarrhea and vomiting.  Genitourinary:  Negative for difficulty urinating.  Musculoskeletal:  Positive for gait problem (Initially walked "sideways" due to dizziness). Negative for neck pain.  Neurological:  Positive for dizziness and headaches. Negative for facial asymmetry, speech difficulty,  weakness and light-headedness.   Physical Exam Updated Vital Signs BP 127/86    Pulse 73    Temp 98.5 F (36.9 C) (Oral)    Resp 19    LMP 01/31/2007    SpO2 100%  Physical Exam Constitutional:      General: She is not in acute distress. HENT:     Head: Normocephalic and atraumatic.  Eyes:     General: No visual field deficit.    Extraocular Movements: Extraocular movements intact.      Conjunctiva/sclera: Conjunctivae normal.     Pupils: Pupils are equal, round, and reactive to light.  Cardiovascular:     Rate and Rhythm: Normal rate and regular rhythm.     Pulses: Normal pulses.     Heart sounds: Normal heart sounds.  Pulmonary:     Effort: Pulmonary effort is normal. No respiratory distress.     Breath sounds: Normal breath sounds.  Abdominal:     General: There is no distension.     Palpations: Abdomen is soft.     Tenderness: There is no abdominal tenderness.  Musculoskeletal:        General: Normal range of motion.     Cervical back: Normal range of motion and neck supple.  Skin:    General: Skin is warm and dry.  Neurological:     Mental Status: She is alert and oriented to person, place, and time.     GCS: GCS eye subscore is 4. GCS verbal subscore is 5. GCS motor subscore is 6.     Cranial Nerves: No dysarthria or facial asymmetry.     Sensory: Sensation is intact. No sensory deficit.     Motor: Motor function is intact.     Coordination: Coordination is intact. Finger-Nose-Finger Test and Heel to [facility] Test normal.     Comments: CN II-VII, XI, XII intact.     ED Results / Procedures / Treatments   Labs (all labs ordered are listed, but only abnormal results are displayed) Labs Reviewed  COMPREHENSIVE METABOLIC PANEL - Abnormal; Notable for the following components:      Result Value   Glucose, Bld 101 (*)    All other components within normal limits  URINALYSIS, ROUTINE W REFLEX MICROSCOPIC - Abnormal; Notable for the following components:   Color, Urine STRAW (*)    Leukocytes,Ua SMALL (*)    Bacteria, UA RARE (*)    All other components within normal limits  PROTIME-INR  APTT  CBC  DIFFERENTIAL  I-STAT CHEM 8, ED  I-STAT BETA HCG BLOOD, ED ([facility], [facility], [facility] ONLY)    EKG EKG Interpretation  Date/Time:  Friday April 01 2021 11:52:45 EST Ventricular Rate:  74 PR Interval:  154 QRS Duration: 74 QT Interval:  380 QTC Calculation: 421 R  Axis:   60 Text Interpretation: Normal sinus rhythm Normal ECG When compared with ECG of 10-Jan-2021 11:16, PREVIOUS ECG IS PRESENT Confirmed by Noemi Chapel [phone]) on 04/01/2021 12:31:19 PM  Radiology CT HEAD WO CONTRAST  Result Date: 04/01/2021 CLINICAL DATA:  Dizziness, persistent/recurrent, cardiac or vascular cause suspected. New onset headaches. Status post aneurysm coiling. EXAM: CT HEAD WITHOUT CONTRAST TECHNIQUE: Contiguous axial images were obtained from the base of the skull through the vertex without intravenous contrast. RADIATION DOSE REDUCTION: This exam was performed according to the departmental dose-optimization program which includes automated exposure control, adjustment of the mA and/or kV according to patient size and/or use of iterative reconstruction technique. COMPARISON:  MR head without contrast  01/10/2021. CTA head and neck 01/10/2021 FINDINGS: Brain: Artifact from coil pack in basilar tip aneurysm noted. Basilar stent noted. No acute infarct, hemorrhage, or mass lesion is present. The ventricles are of normal size. Basal ganglia are within normal limits. No significant extraaxial fluid collection is present. Vascular: No hyperdense vessel or unexpected calcification. Skull: Calvarium is intact. No focal lytic or blastic lesions are present. No significant extracranial soft tissue lesion is present. Sinuses/Orbits: The paranasal sinuses and mastoid air cells are clear. The globes and orbits are within normal limits. IMPRESSION: 1. No acute intracranial abnormality or significant interval change. 2. Status post coil embolization of basilar tip aneurysm. Electronically Signed   By: San Morelle M.D.   On: 04/01/2021 13:20    Procedures Procedures    Medications Ordered in ED Medications  ondansetron (ZOFRAN) injection 4 mg (4 mg Intravenous Given 04/01/21 1338)  ketorolac (TORADOL) 15 MG/ML injection 15 mg (15 mg Intravenous Given 04/01/21 1408)  metoCLOPramide (REGLAN)  injection 10 mg (10 mg Intravenous Given 04/01/21 1407)  diphenhydrAMINE (BENADRYL) injection 50 mg (50 mg Intravenous Given 04/01/21 1408)  diphenhydrAMINE (BENADRYL) injection 50 mg (50 mg Intravenous Given 04/01/21 1452)    ED Course/ Medical Decision Making/ A&P                           Medical Decision Making  This patient presents to the ED for concern of headache, dizziness, nausea, this involves an extensive number of treatment options, and is a complaint that carries with it a high risk of complications and morbidity.  The differential diagnosis includes but is not limited to TIA, CVA, migraine, intracranial bleed, others   Co morbidities that complicate the patient evaluation  History of CVA and TIA   Additional history obtained:   External records from outside source obtained and reviewed including previous ER, Hospital, and clinic notes   Lab Tests:  I Ordered, and personally interpreted labs.  The pertinent results include:  I-stat hCG 3.   Imaging Studies ordered:  I ordered imaging studies including CT head  I independently visualized and interpreted imaging which showed no acute process, no significant interval change, coil embolization of aneurysm I agree with the radiologist interpretation   Cardiac Monitoring:  The patient was maintained on a cardiac monitor.  I personally viewed and interpreted the cardiac monitored which showed an underlying rhythm of: normal sinus rhythm   Medicines ordered and prescription drug management:  I ordered medication including zofran for nausea  Reevaluation of the patient after these medicines showed that the patient improved I have reviewed the patients home medicines and have made adjustments as needed   Test Considered:  MRI brain   Consultations Obtained:  I requested consultation with the neurologist on call, Dr. Lorrin Goodell,  and discussed lab and imaging findings as well as pertinent plan - they recommend: CT  angio head and MRI brain. Further recommendations pending imaging results.   Imaging results pending at shift change. Patient care being handed off to oncoming provider. Further neurology recommendations pending. Headache cocktail and nausea administered with some symptom relief.     Final Clinical Impression(s) / ED Diagnoses Final diagnoses:  Dizziness  Acute nonintractable headache, unspecified headache type  Blurry vision, bilateral    Rx / DC Orders ED Discharge Orders     None         Ronny Bacon 04/01/21 1517    Noemi Chapel, MD 04/03/21  0721 ° °

## 2021-04-01 NOTE — Discharge Instructions (Signed)
Please make sure that you follow-up with your neurologist on Monday regarding your symptoms.  If you develop any new or worsening symptoms whatsoever please come back to the emergency department immediately for reevaluation. ?

## 2021-04-01 NOTE — ED Provider Notes (Signed)
Patient is a 61 year old female whose care was transferred to me at shift change from [facility].  His HPI is below: ? ?Tiffany Horn is a 61 y.o. female. The patient presents to the emergency department complaining of episodes of dizziness, vision changes, and headache ongoing intermittently since Wednesday. Today's episodes began around 9:30 while at work. She experienced a headache that was located across the front of her forehead, nausea, and blurry vision. She states that she is still experiencing those symptoms.  The patient called her neurologist, Dr. Billey Gosling, who recommended ED visit for evaluation. The patient had prior strokes and CVA with similar symptoms. 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 TTE without significant finding. Patient currently on Brilinta.  ?Physical Exam  ?BP 127/86   Pulse 73   Temp 98.5 ?F (36.9 ?C) (Oral)   Resp 19   LMP 01/31/2007   SpO2 100%  ? ?Physical Exam ?Vitals and nursing note reviewed.  ?Constitutional:   ?   General: She is not in acute distress. ?   Appearance: Normal appearance. She is not ill-appearing, toxic-appearing or diaphoretic.  ?HENT:  ?   Head: Normocephalic and atraumatic.  ?   Right Ear: External ear normal.  ?   Left Ear: External ear normal.  ?   Nose: Nose normal.  ?   Mouth/Throat:  ?   Pharynx: Oropharynx is clear.  ?Eyes:  ?   Extraocular Movements: Extraocular movements intact.  ?Cardiovascular:  ?   Rate and Rhythm: Normal rate.  ?   Pulses: Normal pulses.  ?Pulmonary:  ?   Effort: Pulmonary effort is normal.  ?Abdominal:  ?   General: Abdomen is flat. There is no distension.  ?Musculoskeletal:     ?   General: Normal range of motion.  ?   Cervical back: Normal range of motion and neck supple. No tenderness.  ?Skin: ?   General: Skin is warm and dry.   ?Neurological:  ?   General: No focal deficit present.  ?   Mental Status: She is alert and oriented to person, place, and time.  ?   Comments: Patient is oriented to person, place, and time. Patient phonates in clear, complete, and coherent sentences.  No slurring.  No gross deficits.  Ambulatory with steady gait.  ?Psychiatric:     ?   Mood and Affect: Mood normal.     ?   Behavior: Behavior normal.  ? ?Procedures  ?Procedures ? ?ED Course / MDM  ?  ?Medical Decision Making ?Amount and/or Complexity of Data Reviewed ?Radiology: ordered. ? ?Risk ?Prescription drug management. ? ? ?Patient is a 61 year old female whose care was transferred to me at shift change from [facility].  Summary, patient presents due to dizziness, vision changes, and headache that have been ongoing intermittently for the past 2 days.  Today's episode began around 9:30 AM.  Patient has a history of previous CVA with similar symptoms and patient was discussed with neurology.  They recommended CTA of the head and MRI of the brain.  These have since resulted. ? ?CTA of the head with findings as noted below: ? ? ?IMPRESSION:  ?1. Coil pack in the basilar tip aneurysm without evidence for  ?residual or recurrent aneurysm.  ?2. 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  ?  3. Asymmetric attenuation of distal right PCA branch vessels.  ?4. No other significant proximal stenosis, occlusion, or aneurysm.  ? ? ?MRI of the brain without contrast with findings as noted below: ? ? ?IMPRESSION:  ?No evidence of acute intracranial abnormality.  ?   ?Known small chronic infarcts within the right occipital lobe and  ?within the cerebellum were better appreciated on prior MRI  ?examinations (when they were acute/subacute).  ?   ?Punctate focus of signal abnormality within the right parietal white  ?matter, not definitively present on the prior MRI of 01/10/2021.  ?This may reflect a chronic microhemorrhage or focus of   ?mineralization.  ?   ?Susceptibility artifact arising from embolization coils within a  ?treated basilar tip aneurysm.  ?   ?Minimal mucosal thickening within the bilateral ethmoid sinuses.  ? ? ?Patient discussed once again with Dr. Lorrin Goodell with neurology once again.  Agrees with the results above.  Feels that they are reassuring.  Patient does have a history of migraines and her symptoms could be secondary to a migraine.  Recommends outpatient follow-up with neurology as needed.  This was discussed with the patient and she is agreeable. ? ?Patient reassessed and notes moderate improvement in her symptoms.  She was able to ambulate throughout the emergency department with a steady gait.  Feel that she is stable for discharge at this time and she is agreeable.  We discussed return precautions.  Her questions were answered and she was amicable at the time of discharge. ?  ?Rayna Sexton, PA-C ?04/01/21 1831 ? ?  ?Wyvonnia Dusky, MD ?04/02/21 1517 ? ?

## 2021-04-01 NOTE — ED Notes (Signed)
Dc instructions reviewed with pt. Pt verbalized understanding. PT Dc ?

## 2021-04-01 NOTE — ED Notes (Signed)
Pt ambulated to bathroom. She was a little unsteady but was able to ambulate unassisted.  ?

## 2021-04-01 NOTE — ED Notes (Signed)
Pt ambulated independently and steadily. ?

## 2021-04-05 ENCOUNTER — Ambulatory Visit: Payer: BC Managed Care – PPO | Admitting: Family Medicine

## 2021-04-12 ENCOUNTER — Other Ambulatory Visit: Payer: Self-pay

## 2021-04-12 ENCOUNTER — Encounter: Payer: Self-pay | Admitting: Family Medicine

## 2021-04-12 ENCOUNTER — Ambulatory Visit: Payer: BC Managed Care – PPO | Admitting: Family Medicine

## 2021-04-12 VITALS — BP 115/78 | HR 66 | Temp 98.2°F | Ht 65.0 in | Wt 181.0 lb

## 2021-04-12 DIAGNOSIS — E559 Vitamin D deficiency, unspecified: Secondary | ICD-10-CM | POA: Diagnosis not present

## 2021-04-12 DIAGNOSIS — M810 Age-related osteoporosis without current pathological fracture: Secondary | ICD-10-CM | POA: Diagnosis not present

## 2021-04-12 MED ORDER — ALENDRONATE SODIUM 70 MG PO TABS: 70.0000 mg | ORAL_TABLET | ORAL | 11 refills | Status: DC

## 2021-04-12 NOTE — Progress Notes (Signed)
? ? ? ?This visit occurred during the SARS-CoV-2 public health emergency.  Safety protocols were in place, including screening questions prior to the visit, additional usage of staff PPE, and extensive cleaning of exam room while observing appropriate contact time as indicated for disinfecting solutions.  ? ? ?Tiffany Horn , 08-31-1960, 61 y.o., female ?MRN: [id number] ?Patient Care Team  ?  Relationship Specialty Notifications Start End  ?[name], DO PCP - General Family Medicine  04/28/20   ?Jerline Pain, MD PCP - Cardiology Cardiology  03/01/20   ?[name], MD Consulting Physician Gastroenterology  05/29/16   ?[name], CNM Referring Physician Certified Nurse Midwife  05/29/16   ?Gari Crown, MD Referring Physician Obstetrics and Gynecology  07/17/17   ?Garner Nash, DO Consulting Physician Pulmonary Disease  06/12/19   ? ? ?Chief Complaint  ?Patient presents with  ? Osteoporosis  ?  F/u  ? ?  ?Subjective: Pt presents for an OV to follow-up on chronic medical conditions. ?Osteoporosis, unspecified osteoporosis type, unspecified pathological fracture presence/vitamin D deficiency ?Patient reports supplementing with vitamin D.  DEXA 12/08/2019 [facility] on [address]. -2.6.  It was an improvement from 2 years prior.  Patient had been prescribed Fosamax in the past.  This was removed from her med list by her pulmonary team.  She reports she forgot about the medicine and does not recall the last time she had taken Fosamax.  She recent was seen by her gynecologist who questioned when her last DEXA was, which brought up the conversation. ? ?Depression screen [facility] 2/9 04/12/2021 03/07/2021 11/25/2020 05/05/2020 05/21/2019  ?Decreased Interest 1 0 0 0 3  ?Down, Depressed, Hopeless 1 0 0 0 3  ?PHQ - 2 Score 2 0 0 0 6  ?Altered sleeping 0 - - - 1  ?Tired, decreased energy 0 - - - 3  ?Change in appetite 0 - - - 1  ?Feeling bad or failure about yourself  0 - - - 0  ?Trouble concentrating 0 - - - 0  ?Moving  slowly or fidgety/restless 0 - - - 2  ?Suicidal thoughts 0 - - - 0  ?PHQ-9 Score 2 - - - 13  ?Difficult doing work/chores - - - - Somewhat difficult  ?Some recent data might be hidden  ? ? ?Allergies  ?Allergen Reactions  ? Abilify [Aripiprazole] Nausea And Vomiting  ? Amoxicillin Diarrhea  ? Promethazine Hcl   ? Seroquel [Quetiapine Fumarate]   ?  Unknown  ? ?Social History  ? ?Social History Narrative  ? She is originally from [place]. She has traveled to [place], [place], [place], [place], [place], [place], [place], [place], [place]. No international travel. She has dogs. No prior bird, mold, or recent hot tub exposure. She hasn't used her hot tub in 1.5 years. 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 Previously enjoyed gardening and playing tennis. Helps to care for her mother.  ? ?Past Medical History:  ?Diagnosis Date  ? Allergy   ? Anxiety   ? Arthritis   ? knees and spine, shoulder  ? Asthma   ? Bronchitis   ? hx - recurrent  ? Complication of anesthesia   ? waking up is not easy  ? Depression   ? Elevated IgE level 09/12/2017  ? 09/12/2017 IgE 195  ? H/O miscarriage, not currently pregnant   ? History of COVID-19 12/30/2020  ? Hx of irritable bowel syndrome   ?  x2  ? Hyperlipidemia   ? diet controlled - no medication  ? Hypertension   ? not taking any meds at present - under control per patient  ? Hypokalemia   ? with PNA admission (2.5)  ? Ischemic cerebrovascular accident (CVA) ([facility])   ? Migraines   ? Pneumonia   ? 4 episodes; hosp. admission 2014  ? PONV (postoperative nausea and vomiting)   ? Thyroid disease   ? TIA (transient ischemic attack) 12/17/2020  ? UC (ulcerative colitis) ([facility])   ? DX'D 2021  ? ?Past Surgical History:  ?Procedure Laterality Date  ? BREAST SURGERY    ? implants, then had them removed  ? COLONOSCOPY    ? greater 10 yrs ago - ? [facility]-2017 LAST  ? DILATION AND CURETTAGE OF UTERUS    ? IR ANGIO INTRA EXTRACRAN SEL INTERNAL CAROTID BILAT MOD SED   11/19/2020  ? IR ANGIO VERTEBRAL SEL VERTEBRAL BILAT MOD SED  11/19/2020  ? IR ANGIO VERTEBRAL SEL VERTEBRAL UNI L MOD SED  02/25/2021  ? IR ANGIOGRAM FOLLOW UP STUDY  11/19/2020  ? IR ANGIOGRAM FOLLOW UP STUDY  11/19/2020  ? IR ANGIOGRAM FOLLOW UP STUDY  11/19/2020  ? IR ANGIOGRAM FOLLOW UP STUDY  11/19/2020  ? IR ANGIOGRAM FOLLOW UP STUDY  11/19/2020  ? IR ANGIOGRAM FOLLOW UP STUDY  11/19/2020  ? IR ANGIOGRAM FOLLOW UP STUDY  11/19/2020  ? IR ANGIOGRAM FOLLOW UP STUDY  11/19/2020  ? IR INTRA CRAN STENT  11/19/2020  ? IR TRANSCATH/EMBOLIZ  11/19/2020  ? LAPAROSCOPIC ABDOMINAL EXPLORATION  01/31/1992  ? endometriosis  ? ORIF HUMERUS FRACTURE Left 04/01/2013  ? DR Ninfa Linden - shoulder  ? ORIF HUMERUS FRACTURE Left 04/01/2013  ? Procedure: OPEN REDUCTION INTERNAL FIXATION (ORIF) LEFT PROXIMAL HUMERUS FRACTURE;  Surgeon: Mcarthur Rossetti, MD;  Location: [facility];  Service: Orthopedics;  Laterality: Left;  ? RADIOLOGY WITH ANESTHESIA N/A 11/19/2020  ? 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;  Surgeon: Consuella Lose, MD;  Location: [facility];  Service: Radiology;  Laterality: N/A;  ? TONSILLECTOMY AND ADENOIDECTOMY    ? ?Family History  ?Problem Relation Age of Onset  ? Breast cancer Mother   ? Diabetes Mother   ? Heart disease Mother   ? Diabetes Father   ? Dementia Father   ? Colon cancer Father   ? Stroke Paternal Grandmother   ? Diabetes Paternal Grandmother   ? Colon polyps Neg Hx   ? Esophageal cancer Neg Hx   ? Stomach cancer Neg Hx   ? Rectal cancer Neg Hx   ? ?Allergies as of 04/12/2021   ? ?   Reactions  ? Abilify [aripiprazole] Nausea And Vomiting  ? Amoxicillin Diarrhea  ? Promethazine Hcl   ? Seroquel [quetiapine Fumarate]   ? Unknown  ? ?  ? ?  ?Medication List  ?  ? ?  ? Accurate as of April 12, 2021 11:59 PM. If you have any questions, ask your nurse or doctor.  ?  ?  ? ?  ? ?alendronate 70 MG tablet ?Commonly known as: FOSAMAX ?Take 1 tablet (70 mg total) by mouth every 7 (seven) days.  Take with a full glass of water on an empty stomach. ?Started by: Howard Pouch, DO ?  ?amLODipine 10 MG tablet ?Commonly known as: NORVASC ?Take 1 tablet (10 mg total) by mouth daily. ?  ?aspirin 81 MG chewable tablet ?Chew 1 tablet (81 mg total) by mouth daily. ?  ?  atorvastatin 80 MG tablet ?Commonly known as: LIPITOR ?Take 1 tablet (80 mg total) by mouth daily. ?  ?B COMPLEX PO ?Take 1 tablet by mouth daily. ?  ?Breztri Aerosphere 160-9-4.8 MCG/ACT Aero ?Generic drug: Budeson-Glycopyrrol-Formoterol ?Inhale 2 puffs into the lungs in the morning and at bedtime. ?  ?clonazePAM 0.5 MG tablet ?Commonly known as: KLONOPIN ?Take 1 tablet (0.5 mg total) by mouth 3 (three) times daily as needed for anxiety. ?  ?fluticasone furoate-vilanterol 100-25 MCG/ACT Aepb ?Commonly known as: BREO ELLIPTA ?Inhale 1 puff into the lungs daily as needed. ?  ?levothyroxine 125 MCG tablet ?Commonly known as: SYNTHROID ?Take 1 tablet (125 mcg total) by mouth daily. On an empty stomach. ?  ?PARoxetine 40 MG tablet ?Commonly known as: PAXIL ?Take 1 tablet (40 mg total) by mouth daily. ?  ?rizatriptan 5 MG tablet ?Commonly known as: MAXALT ?Take 1 tablet (5 mg total) by mouth daily as needed for migraine. ?  ?ticagrelor 90 MG Tabs tablet ?Commonly known as: BRILINTA ?Take 1 tablet (90 mg total) by mouth 2 (two) times daily. ?  ?vitamin C 1000 MG tablet ?Take 1,000 mg by mouth daily. ?  ?Vitamin D 50 MCG (2000 UT) tablet ?Take 2,000 Units by mouth daily. ?  ? ?  ? ? ?All past medical history, surgical history, allergies, family history, immunizations andmedications were updated in the EMR today and reviewed under the history and medication portions of their EMR.    ? ?ROS ?Negative, with the exception of above mentioned in HPI ? ? ?Objective:  ?BP 115/78   Pulse 66   Temp 98.2 ?F (36.8 ?C) (Oral)   Ht '5\' 5"'$  (1.651 m)   Wt 181 lb (82.1 kg)   LMP 01/31/2007   SpO2 98%   BMI 30.12 kg/m?  ?Body mass index is 30.12 kg/m?Marland Kitchen ?Physical  Exam ?Vitals and nursing note reviewed.  ?Constitutional:   ?   General: She is not in acute distress. ?   Appearance: Normal appearance. She is obese. She is not ill-appearing, toxic-appearing or diaphoretic.  ?HENT:  ?   [place]

## 2021-04-12 NOTE — Patient Instructions (Signed)
Osteopenia/osteoporosis: ?1.) Drink alcohol in moderation only: No more than 4 drinks a day for men and no more than 2 drinks a day for women.  ?2.) Decrease caffeine consumption to no  More than 2.5 cups of coffee a day or nor more than 5 cups of tea a day. ?3.) Exercise: weight bearing (walking counts), strength and balance training. ?4.) No smoking.  ?5.) Sunlight/Ultraviolet light exposure 30 minutes a day/5 days a week. ?6.) Vitamin D supplement 800 iu a day (at least, more if told otherwise) and calcium 1000-1200 mg a day (depending on diet and age).  ? ? ?Osteoporosis ?Osteoporosis happens when the bones become thin and less dense than normal. Osteoporosis makes bones more brittle and fragile and more likely to break (fracture). ?Over time, osteoporosis can cause your bones to become so weak that they fracture after a minor fall. Bones in the hip, wrist, and spine are most likely to fracture due to osteoporosis. ?What are the causes? ?The exact cause of this condition is not known. ?What increases the risk? ?You are more likely to develop this condition if you: ?Have family members with this condition. ?Have poor nutrition. ?Use the following: ?Steroid medicines, such as prednisone. ?Anti-seizure medicines. ?Nicotine or tobacco, such as cigarettes, e-cigarettes, and chewing tobacco. ?Are female. ?Are age 59 or older. ?Are not physically active (are sedentary). ?Are of European or Asian descent. ?Have a small body frame. ?What are the signs or symptoms? ?A fracture might be the first sign of osteoporosis, especially if the fracture results from a fall or injury that usually would not cause a bone to break. Other signs and symptoms include: ?Pain in the neck or low back. ?Stooped posture. ?Loss of height. ?How is this diagnosed? ?This condition may be diagnosed based on: ?Your medical history. ?A physical exam. ?A bone mineral density test, also called a DXA or DEXA test (dual-energy X-ray absorptiometry test).  This test uses X-rays to measure the amount of minerals in your bones. ?How is this treated? ?This condition may be treated by: ?Making lifestyle changes, such as: ?Including foods with more calcium and vitamin D in your diet. ?Doing weight-bearing and muscle-strengthening exercises. ?Stopping tobacco use. ?Limiting alcohol intake. ?Taking medicine to slow the process of bone loss or to increase bone density. ?Taking daily supplements of calcium and vitamin D. ?Taking hormone replacement medicines, such as estrogen for women and testosterone for men. ?Monitoring your levels of calcium and vitamin D. ?The goal of treatment is to strengthen your bones and lower your risk for a fracture. ?Follow these instructions at home: ?Eating and drinking ?Include calcium and vitamin D in your diet. Calcium is important for bone health, and vitamin D helps your body absorb calcium. Good sources of calcium and vitamin D include: ?Certain fatty fish, such as salmon and tuna. ?Products that have calcium and vitamin D added to them (are fortified), such as fortified cereals. ?Egg yolks. ?Cheese. ?Liver. ? ?Activity ?Do exercises as told by your health care provider. Ask your health care provider what exercises and activities are safe for you. You should do: ?Exercises that make you work against gravity (weight-bearing exercises), such as tai chi, yoga, or walking. ?Exercises to strengthen muscles, such as lifting weights. ?Lifestyle ?Do not drink alcohol if: ?Your health care provider tells you not to drink. ?You are pregnant, may be pregnant, or are planning to become pregnant. ?If you drink alcohol: ?Limit how much you use to: ?0-1 drink a day for women. ?0-2 drinks  a day for men. ?Know how much alcohol is in your drink. In the [place] one drink equals one 12 oz bottle of beer (355 mL), one 5 oz glass of wine (148 mL), or one 1? oz glass of hard liquor (44 mL). ?Do not use any products that contain nicotine or tobacco, such as  cigarettes, e-cigarettes, and chewing tobacco. If you need help quitting, ask your health care provider. ?Preventing falls ?Use devices to help you move around (mobility aids) as needed, such as canes, walkers, scooters, or crutches. ?Keep rooms well-lit and clutter-free. ?Remove tripping hazards from walkways, including cords and throw rugs. ?Install grab bars in bathrooms and safety rails on stairs. ?Use rubber mats in the bathroom and other areas that are often wet or slippery. ?Wear closed-toe shoes that fit well and support your feet. Wear shoes that have rubber soles or low heels. ?Review your medicines with your health care provider. Some medicines can cause dizziness or changes in blood pressure, which can increase your risk of falling. ?General instructions ?Take over-the-counter and prescription medicines only as told by your health care provider. ?Keep all follow-up visits. This is important. ?Contact a health care provider if: ?You have never been screened for osteoporosis and you are: ?A woman who is age 44 or older. ?A man who is age 59 or older. ?Get help right away if: ?You fall or injure yourself. ?Summary ?Osteoporosis is thinning and loss of density in your bones. This makes bones more brittle and fragile and more likely to break (fracture),even with minor falls. ?The goal of treatment is to strengthen your bones and lower your risk for a fracture. ?Include calcium and vitamin D in your diet. Calcium is important for bone health, and vitamin D helps your body absorb calcium. ?Talk with your health care provider about screening for osteoporosis if you are a woman who is age 59 or older, or a man who is age 59 or older. ?This information is not intended to replace advice given to you by your health care provider. Make sure you discuss any questions you have with your health care provider. ?Document Revised: 07/03/2019 Document Reviewed: 07/03/2019 ?Elsevier Patient Education ? 2022 [employer]. ? ? ? ? ?

## 2021-04-22 ENCOUNTER — Telehealth: Payer: Self-pay

## 2021-04-22 NOTE — Telephone Encounter (Signed)
Patient spoke to Dr. Raoul Pitch about her migraine headaches a few weeks ago.  She has talked to her friend that is prescribed a medication she takes on a monthly basis, an injection, Emgality. ?Patient would like to try med and see if it helps with her headaches.  She has had 3 migraines this week, and cannot keep missing work. ?Patient aware Dr. Raoul Pitch out of office today and will return on Monday. ? ?Please call [phone] ?

## 2021-04-22 NOTE — Telephone Encounter (Signed)
Did not find migraine being discussed in last note. Please advise.  ?

## 2021-04-25 NOTE — Telephone Encounter (Signed)
Pt called again requesting migraine preventative. Advised we have message to provider who is currently out of office and we hope her to return Wednesday. ?

## 2021-04-27 NOTE — Telephone Encounter (Signed)
We would need to discuss migraine preventative options.  We are only able to provide the oral medications, if she is referring to any injectable migraine medications that would require neurological referral. ? ?If she is wanting oral medication, please have her follow up with this provider-certainly can be a video visit to discuss options with her.  If she is requesting a neurology referral, we can place that order for her today. ?

## 2021-04-28 NOTE — Telephone Encounter (Signed)
LVM for pt to CB regarding medication.  ?

## 2021-04-28 NOTE — Telephone Encounter (Signed)
Pt called about medication. ?Call dropped, called pt back & left vm ?CMA will call back she's with patient at the moment. ? ?Pt cell: [phone] ?

## 2021-04-28 NOTE — Telephone Encounter (Signed)
Pt sched appt with weight loss would like to know if she can do it at that time?  ?

## 2021-04-28 NOTE — Telephone Encounter (Signed)
Spoke with pt regarding medication and instructions.  

## 2021-04-29 NOTE — Telephone Encounter (Signed)
Yes. We can discuss her migraine medications at her weight loss appt.  ?

## 2021-04-29 NOTE — Telephone Encounter (Signed)
LVM with pt regarding upcoming appt and that concerns both can be discussed.  ?

## 2021-05-05 ENCOUNTER — Other Ambulatory Visit (HOSPITAL_BASED_OUTPATIENT_CLINIC_OR_DEPARTMENT_OTHER): Payer: Self-pay | Admitting: Neurosurgery

## 2021-05-05 DIAGNOSIS — I671 Cerebral aneurysm, nonruptured: Secondary | ICD-10-CM

## 2021-05-07 ENCOUNTER — Ambulatory Visit (HOSPITAL_BASED_OUTPATIENT_CLINIC_OR_DEPARTMENT_OTHER)
Admission: RE | Admit: 2021-05-07 | Discharge: 2021-05-07 | Disposition: A | Discharge status: Home / Self Care | Payer: BC Managed Care – PPO | Source: Ambulatory Visit | Attending: Neurosurgery | Admitting: Neurosurgery

## 2021-05-07 ENCOUNTER — Encounter (HOSPITAL_BASED_OUTPATIENT_CLINIC_OR_DEPARTMENT_OTHER): Payer: Self-pay

## 2021-05-07 DIAGNOSIS — I671 Cerebral aneurysm, nonruptured: Secondary | ICD-10-CM

## 2021-05-13 ENCOUNTER — Encounter: Payer: Self-pay | Admitting: Family Medicine

## 2021-05-13 ENCOUNTER — Telehealth: Payer: Self-pay | Admitting: Family Medicine

## 2021-05-13 ENCOUNTER — Encounter: Payer: BC Managed Care – PPO | Admitting: Family Medicine

## 2021-05-13 NOTE — Telephone Encounter (Signed)
FYI

## 2021-05-13 NOTE — Telephone Encounter (Signed)
No show 05/13/2021, 2nd occurrence ?Printed no show letter ? ?1st occurrence 04/05/21 ? ?

## 2021-05-13 NOTE — Progress Notes (Signed)
No show

## 2021-06-02 ENCOUNTER — Telehealth: Payer: Self-pay | Admitting: Neurology

## 2021-06-02 NOTE — Telephone Encounter (Signed)
Patient called in to see if she could get in for an earlier appointment. Stated she had another episode last night of a severe headache that woke her up from her sleep and she checked her BP and it was elevated. Stated she did not want to keep going to the ED for them just to tell her she is having another migraine attack. I let her know Dr. Leonie Man does not have anything available any sooner than she is currently scheduled at the moment, but if she is having urgent symptoms to go be evaluated at the ED. Let her know I'd send in a message to Dr. Leonie Man to advise on further action. Thank you ?

## 2021-06-06 ENCOUNTER — Telehealth: Payer: Self-pay | Admitting: Neurology

## 2021-06-06 NOTE — Telephone Encounter (Signed)
Patient called in requesting to switch her care over to Dr. Jaynee Eagles. She said she's really needing someone to help her with managing the headaches and Dr. Jaynee Eagles came highly recommended. Per pt, she feels like she's not been having any success with her current neurologist and would like to see someone who specializes in that area. Would you both be ok with this? ?

## 2021-06-09 ENCOUNTER — Institutional Professional Consult (permissible substitution): Payer: BC Managed Care – PPO | Admitting: Adult Health

## 2021-06-09 NOTE — Telephone Encounter (Signed)
Patient was placed on my schedule for today at 930. patient showed up late for her appointment.  She was advised that she would have to reschedule by front office staff.  She demanded to see a Freight forwarder.office manager spoke to patient advised that she will need to be rescheduled.  No availability today.  She was also advised by the office manager that she can certainly FU with PCP or if she needs to be seen immediately she would need to go to urgent care/ED ? ?I did not see this initial message before the patient was placed on my schedule.  At the last visit it does not appear that she had any new symptoms per Dr. Leonie Man note.  Due to her complicated stroke history it is best that she be seen by Dr. Leonie Man for her initial evaluation of headaches.  Certainly can see a nurse practitioner after this evaluation. ?

## 2021-06-13 ENCOUNTER — Other Ambulatory Visit (HOSPITAL_COMMUNITY): Payer: Self-pay

## 2021-06-14 NOTE — Therapy (Signed)
[place] ?[address], [address] ?[place], [place], [address] ?Phone: [phone]   Fax:  [phone] ? ?Patient Details  ?Name: Tiffany Horn ?MRN: [id number] ?Date of Birth: 10-29-1960 ?Referring Provider:  No ref. provider found ? ?Encounter Date: 06/14/2021 ? ?Doc created in order to close ST episode. ? ? ?[place], [place] ?06/14/2021, 8:44 AM ? ?[place] [facility] ?[address], [address] ?[place], [place], [address] ?Phone: [phone]   Fax:  [phone] ?

## 2021-07-05 ENCOUNTER — Institutional Professional Consult (permissible substitution): Payer: BC Managed Care – PPO | Admitting: Adult Health

## 2021-07-14 ENCOUNTER — Institutional Professional Consult (permissible substitution): Payer: BC Managed Care – PPO | Admitting: Neurology

## 2021-07-25 ENCOUNTER — Other Ambulatory Visit: Payer: Self-pay

## 2021-07-25 ENCOUNTER — Emergency Department (HOSPITAL_COMMUNITY)
Admission: EM | Admit: 2021-07-25 | Discharge: 2021-07-26 | Disposition: A | Discharge status: Home / Self Care | Payer: BC Managed Care – PPO | Attending: Emergency Medicine | Admitting: Emergency Medicine

## 2021-07-25 ENCOUNTER — Encounter (HOSPITAL_COMMUNITY): Payer: Self-pay

## 2021-07-25 ENCOUNTER — Emergency Department (HOSPITAL_COMMUNITY): Payer: BC Managed Care – PPO

## 2021-07-25 DIAGNOSIS — Z7982 Long term (current) use of aspirin: Secondary | ICD-10-CM | POA: Insufficient documentation

## 2021-07-25 DIAGNOSIS — Z8679 Personal history of other diseases of the circulatory system: Secondary | ICD-10-CM

## 2021-07-25 DIAGNOSIS — R519 Headache, unspecified: Secondary | ICD-10-CM

## 2021-07-25 DIAGNOSIS — Z7902 Long term (current) use of antithrombotics/antiplatelets: Secondary | ICD-10-CM | POA: Diagnosis not present

## 2021-07-25 DIAGNOSIS — H538 Other visual disturbances: Secondary | ICD-10-CM | POA: Insufficient documentation

## 2021-07-25 LAB — I-STAT CHEM 8, ED
BUN: 17 mg/dL (ref 6–20)
Calcium, Ion: 1.18 mmol/L (ref 1.15–1.40)
Chloride: 105 mmol/L (ref 98–111)
Creatinine, Ser: 0.8 mg/dL (ref 0.44–1.00)
Glucose, Bld: 96 mg/dL (ref 70–99)
HCT: 43 % (ref 36.0–46.0)
Hemoglobin: 14.6 g/dL (ref 12.0–15.0)
Potassium: 3.7 mmol/L (ref 3.5–5.1)
Sodium: 142 mmol/L (ref 135–145)
TCO2: 27 mmol/L (ref 22–32)

## 2021-07-25 LAB — CBC WITH DIFFERENTIAL/PLATELET
Abs Immature Granulocytes: 0.02 10*3/uL (ref 0.00–0.07)
Basophils Absolute: 0.1 10*3/uL (ref 0.0–0.1)
Basophils Relative: 2 %
Eosinophils Absolute: 0.2 10*3/uL (ref 0.0–0.5)
Eosinophils Relative: 3 %
HCT: 40.2 % (ref 36.0–46.0)
Hemoglobin: 13.5 g/dL (ref 12.0–15.0)
Immature Granulocytes: 0 %
Lymphocytes Relative: 28 %
Lymphs Abs: 2.1 10*3/uL (ref 0.7–4.0)
MCH: 30.5 pg (ref 26.0–34.0)
MCHC: 33.6 g/dL (ref 30.0–36.0)
MCV: 91 fL (ref 80.0–100.0)
Monocytes Absolute: 0.6 10*3/uL (ref 0.1–1.0)
Monocytes Relative: 8 %
Neutro Abs: 4.4 10*3/uL (ref 1.7–7.7)
Neutrophils Relative %: 59 %
Platelets: 231 10*3/uL (ref 150–400)
RBC: 4.42 MIL/uL (ref 3.87–5.11)
RDW: 13.2 % (ref 11.5–15.5)
WBC: 7.4 10*3/uL (ref 4.0–10.5)
nRBC: 0 % (ref 0.0–0.2)

## 2021-07-25 LAB — BASIC METABOLIC PANEL
Anion gap: 11 (ref 5–15)
BUN: 15 mg/dL (ref 6–20)
CO2: 24 mmol/L (ref 22–32)
Calcium: 9.1 mg/dL (ref 8.9–10.3)
Chloride: 105 mmol/L (ref 98–111)
Creatinine, Ser: 0.87 mg/dL (ref 0.44–1.00)
GFR, Estimated: >60 mL/min (ref >60)
Glucose, Bld: 104 mg/dL — ABNORMAL HIGH (ref 70–99)
Potassium: 3.6 mmol/L (ref 3.5–5.1)
Sodium: 140 mmol/L (ref 135–145)

## 2021-07-25 LAB — PROTIME-INR
INR: 1 (ref 0.8–1.2)
Prothrombin Time: 12.7 seconds (ref 11.4–15.2)

## 2021-07-25 MED ORDER — SODIUM CHLORIDE 0.9 % IV BOLUS
1000.0000 mL | Freq: Once | INTRAVENOUS | Status: AC
  Administered 2021-07-25: 1000 mL via INTRAVENOUS

## 2021-07-25 MED ORDER — IOHEXOL 350 MG/ML SOLN
75.0000 mL | Freq: Once | INTRAVENOUS | Status: AC | PRN
  Administered 2021-07-25: 75 mL via INTRAVENOUS

## 2021-07-25 MED ORDER — FENTANYL CITRATE PF 50 MCG/ML IJ SOSY
50.0000 ug | PREFILLED_SYRINGE | Freq: Once | INTRAMUSCULAR | Status: AC
  Administered 2021-07-25: 50 ug via INTRAVENOUS
  Filled 2021-07-25: qty 1

## 2021-07-25 NOTE — ED Notes (Addendum)
Pt currently resting with lights off to help headache pressure.

## 2021-07-25 NOTE — ED Triage Notes (Signed)
Pt to ED via [facility]. Per EMS, pt has left forehead stabbing pain after feeling a "popping" sensation in head. Pt has hx of CVA and brain aneurysm. Pt has photophobia.   EMS VS 140/88 97% RA 88bpm Cbg=104

## 2021-08-08 LAB — HM MAMMOGRAPHY

## 2021-08-11 ENCOUNTER — Telehealth: Payer: Self-pay

## 2021-08-11 NOTE — Telephone Encounter (Signed)
LVM for pt to CB regarding results.  

## 2021-08-11 NOTE — Telephone Encounter (Signed)
Please inform patient her mammogram is normal.  

## 2021-08-11 NOTE — Telephone Encounter (Signed)
Received Mammogram results from [place] on 08/09/21. Will place on PCP desk for review.

## 2021-08-12 NOTE — Telephone Encounter (Signed)
Spoke with pt regarding labs and instructions.   

## 2021-08-22 ENCOUNTER — Other Ambulatory Visit (HOSPITAL_COMMUNITY): Payer: Self-pay

## 2021-08-31 ENCOUNTER — Other Ambulatory Visit: Payer: Self-pay | Admitting: Family Medicine

## 2021-09-08 ENCOUNTER — Ambulatory Visit: Payer: BC Managed Care – PPO | Admitting: Family Medicine

## 2021-09-08 ENCOUNTER — Encounter: Payer: Self-pay | Admitting: Family Medicine

## 2021-09-08 VITALS — BP 112/76 | HR 71 | Temp 98.4°F | Ht 65.0 in | Wt 179.2 lb

## 2021-09-08 DIAGNOSIS — R002 Palpitations: Secondary | ICD-10-CM

## 2021-09-08 DIAGNOSIS — I10 Essential (primary) hypertension: Secondary | ICD-10-CM | POA: Diagnosis not present

## 2021-09-08 MED ORDER — AMLODIPINE BESYLATE 5 MG PO TABS: 5.0000 mg | ORAL_TABLET | Freq: Every day | ORAL | 0 refills | Status: DC

## 2021-09-08 NOTE — Progress Notes (Signed)
OFFICE VISIT  09/08/2021  CC:  Chief Complaint  Patient presents with   Elevated blood pressure   Patient is a 61 y.o. female who presents for elevated blood pressure.  HPI:  Tiffany Horn had an episode of diaphoresis, palpitations, and mild dizziness that lasted about 1 to 2 minutes. She checked her blood pressure shortly after and it was 184/71. She states her normal blood pressure has been around the 409B systolic.  She has amlodipine on her med list but admits she has not been taking this.  She describes having similar episodes to above without palpitations and they have been occurring every couple of months since she had cerebral aneurysm coiling and CVA.  Since yesterday's episode she has felt well.  Her blood pressure later in the afternoon at [facility] was 353G systolic.  Past Medical History:  Diagnosis Date   Allergy    Anxiety    Arthritis    knees and spine, shoulder   Asthma    Bronchitis    hx - recurrent   Complication of anesthesia    waking up is not easy   Depression    Elevated IgE level 09/12/2017   09/12/2017 IgE 195   H/O miscarriage, not currently pregnant    History of COVID-19 12/30/2020   Hx of irritable bowel syndrome    x2   Hyperlipidemia    diet controlled - no medication   Hypertension    not taking any meds at present - under control per patient   Hypokalemia    with [facility] admission (2.5)   Ischemic cerebrovascular accident (CVA) ([place])    Migraines    Pneumonia    4 episodes; hosp. admission 2014   PONV (postoperative nausea and vomiting)    Thyroid disease    TIA (transient ischemic attack) 12/17/2020   UC (ulcerative colitis) [facility])    DX'D 2021    Past Surgical History:  Procedure Laterality Date   BREAST SURGERY     implants, then had them removed   COLONOSCOPY     greater 10 yrs ago - ? [facility]-2017 LAST   DILATION AND CURETTAGE OF UTERUS     IR ANGIO INTRA EXTRACRAN SEL INTERNAL CAROTID BILAT MOD SED  11/19/2020    IR ANGIO VERTEBRAL SEL VERTEBRAL BILAT MOD SED  11/19/2020   IR ANGIO VERTEBRAL SEL VERTEBRAL UNI L MOD SED  02/25/2021   IR ANGIOGRAM FOLLOW UP STUDY  11/19/2020   IR ANGIOGRAM FOLLOW UP STUDY  11/19/2020   IR ANGIOGRAM FOLLOW UP STUDY  11/19/2020   IR ANGIOGRAM FOLLOW UP STUDY  11/19/2020   IR ANGIOGRAM FOLLOW UP STUDY  11/19/2020   IR ANGIOGRAM FOLLOW UP STUDY  11/19/2020   IR ANGIOGRAM FOLLOW UP STUDY  11/19/2020   IR ANGIOGRAM FOLLOW UP STUDY  11/19/2020   IR INTRA CRAN STENT  11/19/2020   IR TRANSCATH/EMBOLIZ  11/19/2020   LAPAROSCOPIC ABDOMINAL EXPLORATION  01/31/1992   endometriosis   ORIF HUMERUS FRACTURE Left 04/01/2013   DR Ninfa Linden - shoulder   ORIF HUMERUS FRACTURE Left 04/01/2013   Procedure: OPEN REDUCTION INTERNAL FIXATION (ORIF) LEFT PROXIMAL HUMERUS FRACTURE;  Surgeon: Mcarthur Rossetti, MD;  Location: [place];  Service: Orthopedics;  Laterality: Left;   RADIOLOGY WITH ANESTHESIA N/A 11/19/2020   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 Take with a full glass of water on an empty stomach. 4 tablet 11   Ascorbic Acid (VITAMIN C) 1000 MG tablet Take 1,000 mg by mouth daily.     aspirin 81 MG chewable tablet Chew 1 tablet (81 mg total) by mouth daily. 30 tablet 3   atorvastatin (LIPITOR) 80 MG tablet Take 1 tablet (80 mg total) by mouth daily. 90 tablet 1   B Complex Vitamins (B COMPLEX PO) Take 1 tablet by mouth daily.     Budeson-Glycopyrrol-Formoterol (BREZTRI AEROSPHERE) 160-9-4.8 MCG/ACT AERO Inhale 2 puffs into the lungs in the morning and at bedtime. (Patient taking differently: Inhale 2 puffs into the lungs 2 (two) times daily as needed (asthma).) 5.9 g 1    Cholecalciferol (VITAMIN D) 50 MCG (2000 UT) tablet Take 2,000 Units by mouth daily.     clonazePAM (KLONOPIN) 0.5 MG tablet Take 1 tablet (0.5 mg total) by mouth 3 (three) times daily as needed for anxiety. 90 tablet 5   levothyroxine (SYNTHROID) 125 MCG tablet Take 1 tablet (125 mcg total) by mouth daily. On an empty stomach. 90 tablet 3   PARoxetine (PAXIL) 40 MG tablet Take 1 tablet (40 mg total) by mouth daily. 90 tablet 1   rizatriptan (MAXALT) 5 MG tablet Take 1 tablet (5 mg total) by mouth daily as needed for migraine. 10 tablet 5   triamcinolone cream (KENALOG) 0.5 % Apply 1 Application topically 4 (four) times daily as needed (skin irritation).     amLODipine (NORVASC) 10 MG tablet Take 1 tablet (10 mg total) by mouth daily. 90 tablet 1   ticagrelor (BRILINTA) 90 MG TABS tablet Take 1 tablet (90 mg total) by mouth 2 (two) times daily. (Patient not taking: Reported on 07/25/2021) 60 tablet 3   Facility-Administered Medications Prior to Visit  Medication Dose Route Frequency Provider Last Rate Last Admin   0.9 %  sodium chloride infusion  500 mL Intravenous Once [name], MD        Allergies  Allergen Reactions   Abilify [Aripiprazole] Nausea And Vomiting   Amoxicillin Diarrhea   Promethazine Hcl    Seroquel [Quetiapine Fumarate]     Unknown    ROS As per HPI  PE:    09/08/2021   10:55 AM 07/26/2021    1:00 AM 07/26/2021   12:45 AM  Vitals with BMI  Height 5' 5"     Weight 179 lbs 3 oz    BMI 88.91    Systolic 694 503 888  Diastolic 76 84 87  Pulse 71 62 71     Physical Exam  Gen: Alert, well appearing.  Patient is oriented to person, place, time, and situation.a AFFECT: pleasant, lucid thought and speech. CV: RRR, no m/r/g.   LUNGS: CTA bilat, nonlabored resps, good aeration in all lung fields.  LABS:  Last CBC Lab Results  Component Value Date   WBC 7.4 07/25/2021   HGB 13.5 07/25/2021   HCT 40.2 07/25/2021   MCV 91.0 07/25/2021   MCH 30.5  07/25/2021   RDW 13.2 07/25/2021   PLT 231 28/00/3491   Last metabolic panel Lab Results  Component Value Date   GLUCOSE 104 (H) 07/25/2021   NA 140 07/25/2021   K 3.6 07/25/2021   CL 105 07/25/2021   CO2 24 07/25/2021   BUN 15 07/25/2021  CREATININE 0.87 07/25/2021   GFRNONAA >60 07/25/2021   CALCIUM 9.1 07/25/2021   PHOS 4.8 (H) 12/06/2020   PROT 7.2 04/01/2021   ALBUMIN 4.1 04/01/2021   BILITOT 0.5 04/01/2021   ALKPHOS 65 04/01/2021   AST 19 04/01/2021   ALT 18 04/01/2021   ANIONGAP 11 07/25/2021   IMPRESSION AND PLAN:  Uncontrolled hypertension. Episode of diaphoresis and palpitations yesterday, unprovoked, not exertional related. At this point we will just get her started on amlodipine at 5 mg dose. If episodic palpitations recur then I said patient may need outpatient rhythm monitoring but she can further discuss with PCP.  An After Visit Summary was printed and given to the patient.  FOLLOW UP: Return for f/u 2-3 wks with PCP.  Signed:  Crissie Sickles, MD           09/08/2021

## 2021-09-13 ENCOUNTER — Encounter: Payer: Self-pay | Admitting: Family Medicine

## 2021-09-13 ENCOUNTER — Ambulatory Visit (INDEPENDENT_AMBULATORY_CARE_PROVIDER_SITE_OTHER): Payer: BC Managed Care – PPO | Admitting: Family Medicine

## 2021-09-13 VITALS — BP 107/72 | HR 87 | Temp 98.5°F | Ht 65.0 in | Wt 179.0 lb

## 2021-09-13 DIAGNOSIS — E039 Hypothyroidism, unspecified: Secondary | ICD-10-CM | POA: Diagnosis not present

## 2021-09-13 DIAGNOSIS — E559 Vitamin D deficiency, unspecified: Secondary | ICD-10-CM

## 2021-09-13 DIAGNOSIS — I10 Essential (primary) hypertension: Secondary | ICD-10-CM

## 2021-09-13 DIAGNOSIS — G43909 Migraine, unspecified, not intractable, without status migrainosus: Secondary | ICD-10-CM | POA: Diagnosis not present

## 2021-09-13 MED ORDER — PAROXETINE HCL 40 MG PO TABS
40.0000 mg | ORAL_TABLET | Freq: Every day | ORAL | 1 refills | Status: DC
Start: 2021-09-13 — End: 2022-01-27

## 2021-09-13 MED ORDER — AMLODIPINE BESYLATE 5 MG PO TABS
5.0000 mg | ORAL_TABLET | Freq: Every day | ORAL | 1 refills | Status: DC
Start: 2021-09-13 — End: 2022-01-27

## 2021-09-13 MED ORDER — ALENDRONATE SODIUM 70 MG PO TABS: 70.0000 mg | ORAL_TABLET | ORAL | 11 refills | Status: DC

## 2021-09-13 MED ORDER — CLONAZEPAM 0.5 MG PO TABS
0.5000 mg | ORAL_TABLET | Freq: Every day | ORAL | 1 refills | Status: DC
Start: 2021-09-13 — End: 2022-01-27

## 2021-09-13 MED ORDER — RIZATRIPTAN BENZOATE 5 MG PO TABS
5.0000 mg | ORAL_TABLET | Freq: Every day | ORAL | 5 refills | Status: DC | PRN
Start: 2021-09-13 — End: 2022-01-27

## 2021-09-13 MED ORDER — ATORVASTATIN CALCIUM 80 MG PO TABS
80.0000 mg | ORAL_TABLET | Freq: Every day | ORAL | 1 refills | Status: DC
Start: 2021-09-13 — End: 2022-01-27

## 2021-09-13 NOTE — Progress Notes (Signed)
Tiffany Horn , 1961/01/18, 61 y.o., female MRN: [id number] Patient Care Team    Relationship Specialty Notifications Start End  [name], DO PCP - General Family Medicine  04/28/20   Jerline Pain, MD PCP - Cardiology Cardiology  03/01/20   [name], MD Consulting Physician Gastroenterology  05/29/16   [name], CNM Referring Physician Certified Nurse Midwife  05/29/16   Gari Crown, MD Referring Physician Obstetrics and Gynecology  07/17/17   Garner Nash, DO Consulting Physician Pulmonary Disease  06/12/19     Chief Complaint  Patient presents with   Hypertension    Cmc; pt is not fastinh     Subjective: Pt presents for an OV cmc Hypothyroidism, unspecified type Patient reports compliance with levothyroxine 125 mcg daily.   Migraine without status migrainosus, not intractable, unspecified migraine type She reports her migraines are decently controlled on the Maxalt.  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  She is following with neurology closely.  She is compliant with baby aspirin.  Primary hypertension/hyperlipidemia/family history of heart disease/obesity-morbid BMI greater than 30 Pt reports compliance with amlodipine 10 mg daily and atorvastatin 80 mg nightly. Blood pressures ranges at home not routinely checked. Patient denies chest pain, shortness of breath, dizziness or lower extremity edema.  RF: Hypertension, hyperlipidemia, CVA, family history of heart disease.    Anxiety/major depression Patient had been seeing a psychologist and prescribed Paxil 40 mg daily and Klonopin 0.5 mg 3 times daily as needed.  Patient states she has not seen her psychologist in some time. Reports compliance with Paxil daily.  She states she rarely takes the Klonopin now.  Osteoporosis, unspecified osteoporosis type,  unspecified pathological fracture presence/vitamin D deficiency Patient reports supplementing with vitamin D.  DEXA 12/08/2019 [facility] on [employer]. -2.6.  Patient reports compliance with Fosamax     09/13/2021    2:52 PM 04/12/2021    2:14 PM 03/07/2021    8:33 AM 11/25/2020    9:44 AM 05/05/2020   10:48 AM  Depression screen PHQ 2/9  Decreased Interest 1 1 0 0 0  Down, Depressed, Hopeless 0 1 0 0 0  PHQ - 2 Score 1 2 0 0 0  Altered sleeping 3 0     Tired, decreased energy 2 0     Change in appetite 0 0     Feeling bad or failure about yourself  0 0     Trouble concentrating 2 0     Moving slowly or fidgety/restless 0 0     Suicidal thoughts 0 0     PHQ-9 Score 8 2       Allergies  Allergen Reactions   Abilify [Aripiprazole] Nausea And Vomiting   Amoxicillin Diarrhea   Promethazine Hcl    Seroquel [Quetiapine Fumarate]     Unknown   Social History   Social History Narrative   She is originally from [place]. She has traveled to [facility], [place], [place], [place], [place], [place], [place], [place], [place]. No international travel. She has dogs. No prior bird, mold, or recent hot tub exposure. She hasn't used her hot tub in 1.5 years. 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 Previously enjoyed gardening and playing tennis. Helps to care for her mother.   Past Medical History:  Diagnosis Date  Allergy    Anxiety    Arthritis    knees and spine, shoulder   Asthma    Bronchitis    hx - recurrent   Complication of anesthesia    waking up is not easy   Depression    Elevated IgE level 09/12/2017   09/12/2017 IgE 195   H/O miscarriage, not currently pregnant    History of COVID-19 12/30/2020   Hx of irritable bowel syndrome    x2   Hyperlipidemia    diet controlled - no medication   Hypertension    not taking any meds at present - under control per patient   Hypokalemia    with PNA admission (2.5)   Ischemic cerebrovascular accident (CVA) ([place])     Migraines    Pneumonia    4 episodes; hosp. admission 2014   PONV (postoperative nausea and vomiting)    Thyroid disease    TIA (transient ischemic attack) 12/17/2020   UC (ulcerative colitis) [facility])    DX'D 2021   Past Surgical History:  Procedure Laterality Date   BREAST SURGERY     implants, then had them removed   COLONOSCOPY     greater 10 yrs ago - ? [facility]-2017 LAST   DILATION AND CURETTAGE OF UTERUS     IR ANGIO INTRA EXTRACRAN SEL INTERNAL CAROTID BILAT MOD SED  11/19/2020   IR ANGIO VERTEBRAL SEL VERTEBRAL BILAT MOD SED  11/19/2020   IR ANGIO VERTEBRAL SEL VERTEBRAL UNI L MOD SED  02/25/2021   IR ANGIOGRAM FOLLOW UP STUDY  11/19/2020   IR ANGIOGRAM FOLLOW UP STUDY  11/19/2020   IR ANGIOGRAM FOLLOW UP STUDY  11/19/2020   IR ANGIOGRAM FOLLOW UP STUDY  11/19/2020   IR ANGIOGRAM FOLLOW UP STUDY  11/19/2020   IR ANGIOGRAM FOLLOW UP STUDY  11/19/2020   IR ANGIOGRAM FOLLOW UP STUDY  11/19/2020   IR ANGIOGRAM FOLLOW UP STUDY  11/19/2020   IR INTRA CRAN STENT  11/19/2020   IR TRANSCATH/EMBOLIZ  11/19/2020   LAPAROSCOPIC ABDOMINAL EXPLORATION  01/31/1992   endometriosis   ORIF HUMERUS FRACTURE Left 04/01/2013   DR Ninfa Linden - shoulder   ORIF HUMERUS FRACTURE Left 04/01/2013   Procedure: OPEN REDUCTION INTERNAL FIXATION (ORIF) LEFT PROXIMAL HUMERUS FRACTURE;  Surgeon: Mcarthur Rossetti, MD;  Location: [facility];  Service: Orthopedics;  Laterality: Left;   RADIOLOGY WITH ANESTHESIA N/A 11/19/2020   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;  Surgeon: Consuella Lose, MD;  Location: [facility];  Service: Radiology;  Laterality: N/A;   TONSILLECTOMY AND ADENOIDECTOMY     Family History  Problem Relation Age of Onset   Breast cancer Mother    Diabetes Mother    Heart disease Mother    Diabetes Father    Dementia Father    Colon cancer Father    Stroke Paternal Grandmother    Diabetes Paternal Grandmother    Colon polyps Neg Hx    Esophageal cancer Neg  Hx    Stomach cancer Neg Hx    Rectal cancer Neg Hx    Allergies as of 09/13/2021       Reactions   Abilify [aripiprazole] Nausea And Vomiting   Amoxicillin Diarrhea   Promethazine Hcl    Seroquel [quetiapine Fumarate]    Unknown        Medication List        Accurate as of September 13, 2021 11:59 PM. If you have any questions, ask your nurse or doctor.  alendronate 70 MG tablet Commonly known as: FOSAMAX Take 1 tablet (70 mg total) by mouth every 7 (seven) days. Take with a full glass of water on an empty stomach.   amLODipine 5 MG tablet Commonly known as: NORVASC Take 1 tablet (5 mg total) by mouth daily.   aspirin 81 MG chewable tablet Chew 1 tablet (81 mg total) by mouth daily.   atorvastatin 80 MG tablet Commonly known as: LIPITOR Take 1 tablet (80 mg total) by mouth daily.   B COMPLEX PO Take 1 tablet by mouth daily.   Breztri Aerosphere 160-9-4.8 MCG/ACT Aero Generic drug: Budeson-Glycopyrrol-Formoterol Inhale 2 puffs into the lungs in the morning and at bedtime. What changed:  when to take this reasons to take this   clonazePAM 0.5 MG tablet Commonly known as: KLONOPIN Take 1 tablet (0.5 mg total) by mouth daily. What changed:  when to take this reasons to take this Changed by: Howard Pouch, DO   levothyroxine 125 MCG tablet Commonly known as: SYNTHROID Take 1 tablet (125 mcg total) by mouth daily. On an empty stomach.   PARoxetine 40 MG tablet Commonly known as: PAXIL Take 1 tablet (40 mg total) by mouth daily.   rizatriptan 5 MG tablet Commonly known as: MAXALT Take 1 tablet (5 mg total) by mouth daily as needed for migraine.   triamcinolone cream 0.5 % Commonly known as: KENALOG Apply 1 Application topically 4 (four) times daily as needed (skin irritation).   vitamin C 1000 MG tablet Take 1,000 mg by mouth daily.   Vitamin D 50 MCG (2000 UT) tablet Take 2,000 Units by mouth daily.        All past medical history,  surgical history, allergies, family history, immunizations andmedications were updated in the EMR today and reviewed under the history and medication portions of their EMR.     ROS Negative, with the exception of above mentioned in HPI   Objective:  BP 107/72   Pulse 87   Temp 98.5 F (36.9 C) (Oral)   Ht 5' 5"  (1.651 m)   Wt 179 lb (81.2 kg)   LMP 01/31/2007   SpO2 95%   BMI 29.79 kg/m  Body mass index is 29.79 kg/m. Physical Exam Vitals and nursing note reviewed.  Constitutional:      General: She is not in acute distress.    Appearance: Normal appearance. She is not ill-appearing, toxic-appearing or diaphoretic.  HENT:     Head: Normocephalic and atraumatic.  Eyes:     General: No scleral icterus.       Right eye: No discharge.        Left eye: No discharge.     Extraocular Movements: Extraocular movements intact.     Conjunctiva/sclera: Conjunctivae normal.     Pupils: Pupils are equal, round, and reactive to light.  Cardiovascular:     Rate and Rhythm: Normal rate and regular rhythm.  Pulmonary:     Effort: Pulmonary effort is normal. No respiratory distress.     Breath sounds: Normal breath sounds. No wheezing, rhonchi or rales.  Musculoskeletal:     Cervical back: Neck supple. No tenderness.     Right lower leg: No edema.     Left lower leg: No edema.  Lymphadenopathy:     Cervical: No cervical adenopathy.  Skin:    General: Skin is warm and dry.     Coloration: Skin is not jaundiced or pale.     Findings: No erythema or rash.  Neurological:  Mental Status: She is alert and oriented to person, place, and time. Mental status is at baseline.     Motor: No weakness.     Gait: Gait normal.  Psychiatric:        Mood and Affect: Mood normal.        Behavior: Behavior normal.        Thought Content: Thought content normal.        Judgment: Judgment normal.    Assessment/Plan: THENA DEVORA is a 61 y.o. female present for OV for  Hypothyroidism,  unspecified type Continue levothyroxine 125 mcg daily.  Refills will be prescribed and appropriate dose after results received. TSH, T4 free.  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  Patient understands she will need face-to-face follow-up for any refills every 6 months before running out of medications. Contract/UDS UTD [place] controlled substance database reviewed today.  Osteoporosis, unspecified osteoporosis type, unspecified pathological fracture presence/vitamin D deficiency Dexa due 2023-202 Continue vitamin D supplement Vitamin D collected today Reviewed expectations re: course of current medical issues. Discussed self-management of symptoms. Outlined signs and symptoms indicating need for more acute intervention. Patient verbalized understanding and all questions were answered. Patient received an After-Visit Summary.    Orders Placed This Encounter  Procedures   TSH   T4, free   Vitamin D (25 hydroxy)   Direct LDL   Meds ordered this encounter  Medications   alendronate (FOSAMAX) 70 MG tablet    Sig: Take 1 tablet (70 mg total) by mouth every 7 (seven) days. Take with a full glass of water on an empty stomach.    Dispense:  4 tablet    Refill:  11   amLODipine (NORVASC) 5 MG tablet    Sig: Take 1 tablet (5 mg total) by mouth daily.    Dispense:  90 tablet    Refill:  1    atorvastatin (LIPITOR) 80 MG tablet    Sig: Take 1 tablet (80 mg total) by mouth daily.    Dispense:  90 tablet    Refill:  1   PARoxetine (PAXIL) 40 MG tablet    Sig: Take 1 tablet (40 mg total) by mouth daily.    Dispense:  90 tablet    Refill:  1   rizatriptan (MAXALT) 5 MG tablet    Sig: Take 1 tablet (5 mg total) by mouth daily as needed for migraine.    Dispense:  10 tablet    Refill:  5   clonazePAM (KLONOPIN) 0.5 MG tablet    Sig: Take 1 tablet (0.5 mg total) by mouth daily.    Dispense:  90 tablet    Refill:  1   levothyroxine (SYNTHROID) 125 MCG tablet    Sig: Take 1 tablet (125 mcg total) by mouth daily. On an empty stomach.    Dispense:  90 tablet    Refill:  3   Referral Orders  No referral(s) requested today     Note is dictated utilizing voice recognition software. Although note has been proof read prior to  signing, occasional typographical errors still can be missed. If any questions arise, please do not hesitate to call for verification.   electronically signed by:  Howard Pouch, DO  [facility]

## 2021-09-14 ENCOUNTER — Telehealth: Payer: Self-pay | Admitting: Family Medicine

## 2021-09-14 LAB — VITAMIN D 25 HYDROXY (VIT D DEFICIENCY, FRACTURES): Vit D, 25-Hydroxy: 25 ng/mL — ABNORMAL LOW (ref 30–100)

## 2021-09-14 LAB — LDL CHOLESTEROL, DIRECT: Direct LDL: 165 mg/dL — ABNORMAL HIGH (ref <100)

## 2021-09-14 LAB — T4, FREE: Free T4: 1.2 ng/dL (ref 0.8–1.8)

## 2021-09-14 LAB — TSH: TSH: 3.37 mIU/L (ref 0.40–4.50)

## 2021-09-14 MED ORDER — LEVOTHYROXINE SODIUM 125 MCG PO TABS: 125.0000 ug | ORAL_TABLET | Freq: Every day | ORAL | 3 refills | Status: DC

## 2021-09-14 NOTE — Telephone Encounter (Signed)
Patient has been informed of her results and recommendations.

## 2021-09-14 NOTE — Telephone Encounter (Signed)
Please call patient Thyroid levels are normal, I have refilled her thyroid medicine for her today. LDL/bad cholesterol is high at 165.  I would encourage her to make sure she takes her Lipitor every evening.  If she reports she has been taking her Lipitor every evening, then we would also call in an additional medication called Zetia to target her LDL.  We can discuss this further at her upcoming physical. Vitamin D is better but still mildly low at 25.  Encouraged her to increase her vitamin D supplement by [phone] units daily to her current supplement

## 2021-09-14 NOTE — Telephone Encounter (Signed)
LM for pt to return call regarding results.

## 2021-09-16 ENCOUNTER — Ambulatory Visit (INDEPENDENT_AMBULATORY_CARE_PROVIDER_SITE_OTHER): Payer: BC Managed Care – PPO | Admitting: Family Medicine

## 2021-09-16 ENCOUNTER — Encounter: Payer: Self-pay | Admitting: Family Medicine

## 2021-09-16 VITALS — BP 121/89 | HR 84 | Temp 98.9°F | Ht 65.5 in | Wt 177.0 lb

## 2021-09-16 DIAGNOSIS — Z0000 Encounter for general adult medical examination without abnormal findings: Secondary | ICD-10-CM | POA: Diagnosis not present

## 2021-09-16 DIAGNOSIS — M810 Age-related osteoporosis without current pathological fracture: Secondary | ICD-10-CM

## 2021-09-16 DIAGNOSIS — E782 Mixed hyperlipidemia: Secondary | ICD-10-CM

## 2021-09-16 DIAGNOSIS — Z23 Encounter for immunization: Secondary | ICD-10-CM | POA: Diagnosis not present

## 2021-09-16 DIAGNOSIS — E034 Atrophy of thyroid (acquired): Secondary | ICD-10-CM

## 2021-09-16 DIAGNOSIS — G43809 Other migraine, not intractable, without status migrainosus: Secondary | ICD-10-CM

## 2021-09-16 DIAGNOSIS — I725 Aneurysm of other precerebral arteries: Secondary | ICD-10-CM

## 2021-09-16 DIAGNOSIS — I639 Cerebral infarction, unspecified: Secondary | ICD-10-CM

## 2021-09-16 DIAGNOSIS — Z1231 Encounter for screening mammogram for malignant neoplasm of breast: Secondary | ICD-10-CM

## 2021-09-16 MED ORDER — ONDANSETRON HCL 4 MG/2ML IJ SOLN
4.0000 mg | INTRAMUSCULAR | Status: AC
  Administered 2021-09-16: 4 mg via INTRAMUSCULAR

## 2021-09-16 MED ORDER — EZETIMIBE 10 MG PO TABS: 10.0000 mg | ORAL_TABLET | Freq: Every day | ORAL | 3 refills | Status: DC

## 2021-09-16 NOTE — Patient Instructions (Signed)

## 2021-09-16 NOTE — Progress Notes (Signed)
Patient ID: Tiffany Horn, female  DOB: 1961/01/02, 61 y.o.   MRN: [id number] Patient Care Team    Relationship Specialty Notifications Start End  [name], DO PCP - General Family Medicine  04/28/20   Jerline Pain, MD PCP - Cardiology Cardiology  03/01/20   [name], MD Consulting Physician Gastroenterology  05/29/16   [name], CNM Referring Physician Certified Nurse Midwife  05/29/16   Gari Crown, MD Referring Physician Obstetrics and Gynecology  07/17/17   Garner Nash, DO Consulting Physician Pulmonary Disease  06/12/19     Chief Complaint  Patient presents with   Annual Exam    Pt is fasting     Subjective: Tiffany Horn is a 61 y.o.  Female  present for CPE  All past medical history, surgical history, allergies, family history, immunizations, medications and social history were uppdated in the electronic medical record today. All recent labs, ED visits and hospitalizations within the last year were reviewed.  Health maintenance:  Colonoscopy: completed 09/20/2020, by Dr.Nandigam, . follow up 1 yr DUE- has appt mid Sept w/ GI Mammogram: completed:08/08/2021,  [facility]> placed order for 2024 Cervical cancer screening: last pap: 03/16/2021, completed by: dr. Quincy Simmonds Immunizations: tdap UTD 2016, Influenza completed today (encouraged yearly), shingrix, completed, PNA20 completed today Infectious disease screening: HIV and Hep C completed DEXA: last completed 12/08/2019, -2.6, [facility]>ordered Assistive device: none Oxygen [id number] Patient has a Dental home. Hospitalizations/ED visits: reviewed     09/13/2021    2:52 PM 04/12/2021    2:14 PM 03/07/2021    8:33 AM 11/25/2020    9:44 AM 05/05/2020   10:48 AM  Depression screen PHQ 2/9  Decreased Interest 1 1 0 0 0  Down, Depressed, Hopeless 0 1 0 0 0  PHQ - 2 Score 1 2 0 0 0  Altered sleeping 3 0     Tired, decreased energy 2 0     Change in appetite 0 0     Feeling bad or  failure about yourself  0 0     Trouble concentrating 2 0     Moving slowly or fidgety/restless 0 0     Suicidal thoughts 0 0     PHQ-9 Score 8 2         09/13/2021    2:52 PM 03/06/2017   10:14 AM 09/29/2016    2:41 PM  GAD 7 : Generalized Anxiety Score  Nervous, Anxious, on Edge 1 3 3   Control/stop worrying 1 2 1   Worry too much - different things 1 2 0  Trouble relaxing 1 3 3   Restless 1 0 3  Easily annoyed or irritable 2 0 3  Afraid - awful might happen 1 2 3   Total GAD 7 Score 8 12 16   Anxiety Difficulty   Very difficult     Immunization History  Administered Date(s) Administered   Influenza Inj Mdck Quad Pf 10/26/2018   Influenza,inj,Quad PF,6+ Mos 10/19/2014, 10/01/2018, 12/29/2019, 09/16/2021   Influenza,inj,quad, With Preservative 11/03/2016, 11/18/2017   Influenza-Unspecified 11/02/2016   Moderna Sars-Covid-2 Vaccination 04/07/2019, 05/13/2019   PFIZER(Purple Top)SARS-COV-2 Vaccination 01/02/2020   PNEUMOCOCCAL CONJUGATE-20 09/16/2021   PPD Test 01/14/2020   Pneumococcal Conjugate-13 11/18/2017   Tdap 01/30/2014   Zoster Recombinat (Shingrix) 06/18/2019, 09/03/2019     Past Medical History:  Diagnosis Date   Allergy    Anxiety    Arthritis    knees and spine, shoulder  Asthma    Bronchitis    hx - recurrent   Complication of anesthesia    waking up is not easy   Depression    Elevated IgE level 09/12/2017   09/12/2017 IgE 195   H/O miscarriage, not currently pregnant    History of COVID-19 12/30/2020   Hx of irritable bowel syndrome    x2   Hyperlipidemia    diet controlled - no medication   Hypertension    not taking any meds at present - under control per patient   Hypokalemia    with PNA admission (2.5)   Ischemic cerebrovascular accident (CVA) ([place])    Migraines    Pneumonia    4 episodes; hosp. admission 2014   PONV (postoperative nausea and vomiting)    Thyroid disease    TIA (transient ischemic attack) 12/17/2020   UC (ulcerative  colitis) [facility])    DX'D 2021   Allergies  Allergen Reactions   Abilify [Aripiprazole] Nausea And Vomiting   Amoxicillin Diarrhea   Promethazine Hcl    Seroquel [Quetiapine Fumarate]     Unknown   Past Surgical History:  Procedure Laterality Date   BREAST SURGERY     implants, then had them removed   COLONOSCOPY     greater 10 yrs ago - ? [facility]-2017 LAST   DILATION AND CURETTAGE OF UTERUS     IR ANGIO INTRA EXTRACRAN SEL INTERNAL CAROTID BILAT MOD SED  11/19/2020   IR ANGIO VERTEBRAL SEL VERTEBRAL BILAT MOD SED  11/19/2020   IR ANGIO VERTEBRAL SEL VERTEBRAL UNI L MOD SED  02/25/2021   IR ANGIOGRAM FOLLOW UP STUDY  11/19/2020   IR ANGIOGRAM FOLLOW UP STUDY  11/19/2020   IR ANGIOGRAM FOLLOW UP STUDY  11/19/2020   IR ANGIOGRAM FOLLOW UP STUDY  11/19/2020   IR ANGIOGRAM FOLLOW UP STUDY  11/19/2020   IR ANGIOGRAM FOLLOW UP STUDY  11/19/2020   IR ANGIOGRAM FOLLOW UP STUDY  11/19/2020   IR ANGIOGRAM FOLLOW UP STUDY  11/19/2020   IR INTRA CRAN STENT  11/19/2020   IR TRANSCATH/EMBOLIZ  11/19/2020   LAPAROSCOPIC ABDOMINAL EXPLORATION  01/31/1992   endometriosis   ORIF HUMERUS FRACTURE Left 04/01/2013   DR Ninfa Linden - shoulder   ORIF HUMERUS FRACTURE Left 04/01/2013   Procedure: OPEN REDUCTION INTERNAL FIXATION (ORIF) LEFT PROXIMAL HUMERUS FRACTURE;  Surgeon: Mcarthur Rossetti, MD;  Location: [facility];  Service: Orthopedics;  Laterality: Left;   RADIOLOGY WITH ANESTHESIA N/A 11/19/2020   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;  Surgeon: Consuella Lose, MD;  Location: [facility];  Service: Radiology;  Laterality: N/A;   TONSILLECTOMY AND ADENOIDECTOMY     Family History  Problem Relation Age of Onset   Breast cancer Mother    Diabetes Mother    Heart disease Mother    Diabetes Father    Dementia Father    Colon cancer Father    Stroke Paternal Grandmother    Diabetes Paternal Grandmother    Colon polyps Neg Hx    Esophageal cancer Neg Hx    Stomach  cancer Neg Hx    Rectal cancer Neg Hx    Social History   Social History Narrative   She is originally from [place]. She has traveled to [facility], [place], [place], [place], [place], [place], [place], [place], [place]. No international travel. She has dogs. No prior bird, mold, or recent hot tub exposure. She hasn't used her hot tub in 1.5 years. 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 Previously enjoyed gardening and playing tennis. Helps to care for her mother.    Allergies as of 09/16/2021       Reactions   Abilify [aripiprazole] Nausea And Vomiting   Amoxicillin Diarrhea   Promethazine Hcl    Seroquel [quetiapine Fumarate]    Unknown        Medication List        Accurate as of September 16, 2021 10:57 AM. If you have any questions, ask your nurse or doctor.          alendronate 70 MG tablet Commonly known as: FOSAMAX Take 1 tablet (70 mg total) by mouth every 7 (seven) days. Take with a full glass of water on an empty stomach.   amLODipine 5 MG tablet Commonly known as: NORVASC Take 1 tablet (5 mg total) by mouth daily.   aspirin 81 MG chewable tablet Chew 1 tablet (81 mg total) by mouth daily.   atorvastatin 80 MG tablet Commonly known as: LIPITOR Take 1 tablet (80 mg total) by mouth daily.   B COMPLEX PO Take 1 tablet by mouth daily.   Breztri Aerosphere 160-9-4.8 MCG/ACT Aero Generic drug: Budeson-Glycopyrrol-Formoterol Inhale 2 puffs into the lungs in the morning and at bedtime. What changed:  when to take this reasons to take this   clonazePAM 0.5 MG tablet Commonly known as: KLONOPIN Take 1 tablet (0.5 mg total) by mouth daily.   ezetimibe 10 MG tablet Commonly known as: Zetia Take 1 tablet (10 mg total) by mouth daily. Started by: Howard Pouch, DO   levothyroxine 125 MCG tablet Commonly known as: SYNTHROID Take 1 tablet (125 mcg total) by mouth daily. On an empty stomach.   PARoxetine 40 MG tablet Commonly known as:  PAXIL Take 1 tablet (40 mg total) by mouth daily.   rizatriptan 5 MG tablet Commonly known as: MAXALT Take 1 tablet (5 mg total) by mouth daily as needed for migraine.   triamcinolone cream 0.5 % Commonly known as: KENALOG Apply 1 Application topically 4 (four) times daily as needed (skin irritation).   vitamin C 1000 MG tablet Take 1,000 mg by mouth daily.   Vitamin D 50 MCG (2000 UT) tablet Take 2,000 Units by mouth daily.        All past medical history, surgical history, allergies, family history, immunizations andmedications were updated in the EMR today and reviewed under the history and medication portions of their EMR.       CT ANGIO HEAD NECK W WO CM Result Date: 07/26/2021 IMPRESSION: 1. No acute intracranial abnormality. 2. Sequelae of prior stent assisted coil embolization of basilar tip aneurysm. No visible residual or recurrent aneurysm by CT. 3. Focal moderate to severe stenosis at the distal aspect of the right P2 stent, stable. 4. Otherwise negative CTA of the head and neck. No other large vessel occlusion, hemodynamically significant stenosis, or other acute vascular abnormality. 5. Aberrant right subclavian artery.    ROS 14 pt review of systems performed and negative (unless mentioned in an HPI)  Objective: BP 121/89   Pulse 84   Temp 98.9 F (37.2 C) (Oral)   Ht 5' 5.5" (1.664 m)   Wt 177 lb (80.3 kg)   LMP 01/31/2007   SpO2 96%   BMI 29.01 kg/m  Physical Exam Vitals and nursing note reviewed.  Constitutional:      General: She is not in acute distress.  Appearance: Normal appearance. She is not ill-appearing or toxic-appearing.  HENT:     Head: Normocephalic and atraumatic.     Right Ear: Tympanic membrane, ear canal and external ear normal. There is no impacted cerumen.     Left Ear: Tympanic membrane, ear canal and external ear normal. There is no impacted cerumen.     Nose: No congestion or rhinorrhea.     Mouth/Throat:     Mouth:  Mucous membranes are moist.     Pharynx: Oropharynx is clear. No oropharyngeal exudate or posterior oropharyngeal erythema.  Eyes:     General: No scleral icterus.       Right eye: No discharge.        Left eye: No discharge.     Extraocular Movements: Extraocular movements intact.     Conjunctiva/sclera: Conjunctivae normal.     Pupils: Pupils are equal, round, and reactive to light.  Cardiovascular:     Rate and Rhythm: Normal rate and regular rhythm.     Pulses: Normal pulses.     Heart sounds: Normal heart sounds. No murmur heard.    No friction rub. No gallop.  Pulmonary:     Effort: Pulmonary effort is normal. No respiratory distress.     Breath sounds: Normal breath sounds. No stridor. No wheezing, rhonchi or rales.  Chest:     Chest wall: No tenderness.  Abdominal:     General: Abdomen is flat. Bowel sounds are normal. There is no distension.     Palpations: Abdomen is soft. There is no mass.     Tenderness: There is no abdominal tenderness. There is no right CVA tenderness, left CVA tenderness, guarding or rebound.     Hernia: No hernia is present.  Musculoskeletal:        General: No swelling, tenderness or deformity. Normal range of motion.     Cervical back: Normal range of motion and neck supple. No rigidity or tenderness.     Right lower leg: No edema.     Left lower leg: No edema.  Lymphadenopathy:     Cervical: No cervical adenopathy.  Skin:    General: Skin is warm and dry.     Coloration: Skin is not jaundiced or pale.     Findings: No bruising, erythema, lesion or rash.  Neurological:     General: No focal deficit present.     Mental Status: She is alert and oriented to person, place, and time. Mental status is at baseline.     Cranial Nerves: No cranial nerve deficit.     Sensory: No sensory deficit.     Motor: No weakness.     Coordination: Coordination normal.     Gait: Gait normal.     Deep Tendon Reflexes: Reflexes normal.  Psychiatric:        Mood  and Affect: Mood normal.        Behavior: Behavior normal.        Thought Content: Thought content normal.        Judgment: Judgment normal.      No results found.  Assessment/plan: Tiffany Horn is a 61 y.o. 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  - taking atorvastatin 80 mg qd Discussed repatha if needed. Goal 70-100 LDL - Pneumococcal conjugate vaccine 20-valent (Prevnar 20)  Breast cancer screening by mammogram - MM 3D SCREEN BREAST BILATERAL; Future  Migraine: IM zofran provided today.  She is picking maxalt up at pharmacy today  Routine general medical examination at a health care facility Colonoscopy: completed 09/20/2020, by Dr.Nandigam, . follow up 1 yr DUE- has appt mid Sept w/ GI Mammogram: completed:08/08/2021,  [facility]> placed order for 2024 Cervical cancer screening: last pap: 03/16/2021, completed by: dr. Quincy Simmonds Immunizations: tdap UTD 2016, Influenza completed today (encouraged yearly), shingrix, completed, PNA20 completed today Infectious disease screening: HIV and Hep C completed DEXA: last completed 12/08/2019, -2.6, [facility]>ordered Patient was encouraged to exercise greater than 150 minutes a week. Patient was encouraged to choose a diet filled with fresh fruits and vegetables, and lean meats. AVS provided to patient today for education/recommendation on gender specific health and safety maintenance.   Return in about 24 weeks (around 03/03/2022) for  Routine chronic condition follow-up.   Orders Placed This Encounter  Procedures   DG Bone Density   MM 3D SCREEN BREAST BILATERAL   Flu Vaccine QUAD 6+ mos PF IM (Fluarix Quad PF)   Pneumococcal conjugate vaccine 20-valent (Prevnar 20)   Meds ordered this encounter  Medications   ezetimibe (ZETIA) 10 MG tablet    Sig: Take 1 tablet (10 mg total) by mouth daily.    Dispense:  90 tablet    Refill:  3   ondansetron (ZOFRAN) injection 4 mg   Referral Orders  No referral(s) requested today     Electronically signed by: Howard Pouch, [facility]

## 2021-10-11 ENCOUNTER — Telehealth: Payer: Self-pay | Admitting: Nurse Practitioner

## 2021-10-11 ENCOUNTER — Ambulatory Visit: Payer: BC Managed Care – PPO | Admitting: Nurse Practitioner

## 2021-10-11 NOTE — Telephone Encounter (Signed)
Noted  

## 2021-10-11 NOTE — Telephone Encounter (Signed)
Patient called to cancel appointment 9/12. Called to let you know no response.

## 2021-11-18 ENCOUNTER — Encounter: Payer: Self-pay | Admitting: Gastroenterology

## 2021-11-29 ENCOUNTER — Other Ambulatory Visit: Payer: Self-pay | Admitting: Neurosurgery

## 2021-11-29 DIAGNOSIS — I671 Cerebral aneurysm, nonruptured: Secondary | ICD-10-CM

## 2021-12-12 ENCOUNTER — Other Ambulatory Visit: Payer: Self-pay | Admitting: Neurosurgery

## 2022-01-04 ENCOUNTER — Encounter (HOSPITAL_COMMUNITY): Payer: Self-pay | Admitting: Internal Medicine

## 2022-01-04 ENCOUNTER — Emergency Department (HOSPITAL_COMMUNITY): Payer: BC Managed Care – PPO

## 2022-01-04 ENCOUNTER — Other Ambulatory Visit: Payer: Self-pay

## 2022-01-04 ENCOUNTER — Observation Stay (HOSPITAL_COMMUNITY): Payer: BC Managed Care – PPO

## 2022-01-04 ENCOUNTER — Other Ambulatory Visit (HOSPITAL_COMMUNITY): Payer: Self-pay | Admitting: *Deleted

## 2022-01-04 ENCOUNTER — Observation Stay (HOSPITAL_COMMUNITY)
Admission: EM | Admit: 2022-01-04 | Discharge: 2022-01-05 | Disposition: A | Discharge status: Home / Self Care | Payer: BC Managed Care – PPO | Attending: Internal Medicine | Admitting: Internal Medicine

## 2022-01-04 ENCOUNTER — Observation Stay (HOSPITAL_BASED_OUTPATIENT_CLINIC_OR_DEPARTMENT_OTHER): Payer: BC Managed Care – PPO

## 2022-01-04 DIAGNOSIS — I10 Essential (primary) hypertension: Secondary | ICD-10-CM | POA: Insufficient documentation

## 2022-01-04 DIAGNOSIS — E039 Hypothyroidism, unspecified: Secondary | ICD-10-CM | POA: Insufficient documentation

## 2022-01-04 DIAGNOSIS — G459 Transient cerebral ischemic attack, unspecified: Secondary | ICD-10-CM | POA: Diagnosis present

## 2022-01-04 DIAGNOSIS — F330 Major depressive disorder, recurrent, mild: Secondary | ICD-10-CM | POA: Diagnosis present

## 2022-01-04 DIAGNOSIS — Z79899 Other long term (current) drug therapy: Secondary | ICD-10-CM | POA: Diagnosis not present

## 2022-01-04 DIAGNOSIS — Z8616 Personal history of COVID-19: Secondary | ICD-10-CM | POA: Diagnosis not present

## 2022-01-04 DIAGNOSIS — G44201 Tension-type headache, unspecified, intractable: Secondary | ICD-10-CM | POA: Diagnosis not present

## 2022-01-04 DIAGNOSIS — Z8673 Personal history of transient ischemic attack (TIA), and cerebral infarction without residual deficits: Secondary | ICD-10-CM | POA: Diagnosis not present

## 2022-01-04 DIAGNOSIS — R519 Headache, unspecified: Secondary | ICD-10-CM | POA: Diagnosis not present

## 2022-01-04 DIAGNOSIS — I639 Cerebral infarction, unspecified: Secondary | ICD-10-CM

## 2022-01-04 DIAGNOSIS — J45909 Unspecified asthma, uncomplicated: Secondary | ICD-10-CM | POA: Insufficient documentation

## 2022-01-04 DIAGNOSIS — Z7982 Long term (current) use of aspirin: Secondary | ICD-10-CM | POA: Insufficient documentation

## 2022-01-04 DIAGNOSIS — R531 Weakness: Secondary | ICD-10-CM | POA: Diagnosis not present

## 2022-01-04 DIAGNOSIS — J4540 Moderate persistent asthma, uncomplicated: Secondary | ICD-10-CM | POA: Diagnosis present

## 2022-01-04 DIAGNOSIS — E785 Hyperlipidemia, unspecified: Secondary | ICD-10-CM | POA: Diagnosis present

## 2022-01-04 HISTORY — DX: Hypothyroidism, unspecified: E03.9

## 2022-01-04 LAB — I-STAT BETA HCG BLOOD, ED (MC, WL, AP ONLY): I-stat hCG, quantitative: <5 m[IU]/mL (ref <5)

## 2022-01-04 LAB — COMPREHENSIVE METABOLIC PANEL
ALT: 19 U/L (ref 0–44)
AST: 19 U/L (ref 15–41)
Albumin: 3.9 g/dL (ref 3.5–5.0)
Alkaline Phosphatase: 54 U/L (ref 38–126)
Anion gap: 7 (ref 5–15)
BUN: 11 mg/dL (ref 6–20)
CO2: 25 mmol/L (ref 22–32)
Calcium: 9.1 mg/dL (ref 8.9–10.3)
Chloride: 108 mmol/L (ref 98–111)
Creatinine, Ser: 0.76 mg/dL (ref 0.44–1.00)
GFR, Estimated: >60 mL/min (ref >60)
Glucose, Bld: 107 mg/dL — ABNORMAL HIGH (ref 70–99)
Potassium: 3.7 mmol/L (ref 3.5–5.1)
Sodium: 140 mmol/L (ref 135–145)
Total Bilirubin: 0.5 mg/dL (ref 0.3–1.2)
Total Protein: 6.9 g/dL (ref 6.5–8.1)

## 2022-01-04 LAB — I-STAT CHEM 8, ED
BUN: 11 mg/dL (ref 6–20)
Calcium, Ion: 1.19 mmol/L (ref 1.15–1.40)
Chloride: 106 mmol/L (ref 98–111)
Creatinine, Ser: 0.7 mg/dL (ref 0.44–1.00)
Glucose, Bld: 107 mg/dL — ABNORMAL HIGH (ref 70–99)
HCT: 43 % (ref 36.0–46.0)
Hemoglobin: 14.6 g/dL (ref 12.0–15.0)
Potassium: 3.8 mmol/L (ref 3.5–5.1)
Sodium: 141 mmol/L (ref 135–145)
TCO2: 23 mmol/L (ref 22–32)

## 2022-01-04 LAB — CBC
HCT: 42.6 % (ref 36.0–46.0)
Hemoglobin: 14.7 g/dL (ref 12.0–15.0)
MCH: 31.3 pg (ref 26.0–34.0)
MCHC: 34.5 g/dL (ref 30.0–36.0)
MCV: 90.8 fL (ref 80.0–100.0)
Platelets: 207 10*3/uL (ref 150–400)
RBC: 4.69 MIL/uL (ref 3.87–5.11)
RDW: 12.7 % (ref 11.5–15.5)
WBC: 5 10*3/uL (ref 4.0–10.5)
nRBC: 0 % (ref 0.0–0.2)

## 2022-01-04 LAB — DIFFERENTIAL
Abs Immature Granulocytes: 0.01 10*3/uL (ref 0.00–0.07)
Basophils Absolute: 0.1 10*3/uL (ref 0.0–0.1)
Basophils Relative: 1 %
Eosinophils Absolute: 0.2 10*3/uL (ref 0.0–0.5)
Eosinophils Relative: 3 %
Immature Granulocytes: 0 %
Lymphocytes Relative: 36 %
Lymphs Abs: 1.8 10*3/uL (ref 0.7–4.0)
Monocytes Absolute: 0.4 10*3/uL (ref 0.1–1.0)
Monocytes Relative: 8 %
Neutro Abs: 2.6 10*3/uL (ref 1.7–7.7)
Neutrophils Relative %: 52 %

## 2022-01-04 LAB — ETHANOL: Alcohol, Ethyl (B): <10 mg/dL (ref <10)

## 2022-01-04 LAB — CBG MONITORING, ED: Glucose-Capillary: 106 mg/dL — ABNORMAL HIGH (ref 70–99)

## 2022-01-04 LAB — PROTIME-INR
INR: 1 (ref 0.8–1.2)
Prothrombin Time: 12.6 seconds (ref 11.4–15.2)

## 2022-01-04 LAB — APTT: aPTT: 26 seconds (ref 24–36)

## 2022-01-04 LAB — ECHOCARDIOGRAM COMPLETE
Area-P 1/2: 3.65 cm2
S' Lateral: 2.8 cm
Weight: 2800 oz

## 2022-01-04 MED ORDER — ENOXAPARIN SODIUM 40 MG/0.4ML IJ SOSY
40.0000 mg | PREFILLED_SYRINGE | Freq: Every day | INTRAMUSCULAR | Status: DC
  Administered 2022-01-04: 40 mg via SUBCUTANEOUS
  Filled 2022-01-04 (×2): qty 0.4

## 2022-01-04 MED ORDER — LEVOTHYROXINE SODIUM 25 MCG PO TABS
125.0000 ug | ORAL_TABLET | Freq: Every morning | ORAL | Status: DC
  Administered 2022-01-05: 125 ug via ORAL
  Filled 2022-01-04: qty 1

## 2022-01-04 MED ORDER — ATORVASTATIN CALCIUM 80 MG PO TABS
80.0000 mg | ORAL_TABLET | Freq: Every day | ORAL | Status: DC
  Administered 2022-01-04 – 2022-01-05 (×2): 80 mg via ORAL
  Filled 2022-01-04: qty 1
  Filled 2022-01-04: qty 2

## 2022-01-04 MED ORDER — PROCHLORPERAZINE EDISYLATE 10 MG/2ML IJ SOLN
10.0000 mg | Freq: Once | INTRAMUSCULAR | Status: AC
  Administered 2022-01-04: 10 mg via INTRAVENOUS
  Filled 2022-01-04: qty 2

## 2022-01-04 MED ORDER — CLONAZEPAM 0.5 MG PO TABS
0.5000 mg | ORAL_TABLET | Freq: Every day | ORAL | Status: DC | PRN
  Administered 2022-01-04: 0.5 mg via ORAL
  Filled 2022-01-04: qty 1

## 2022-01-04 MED ORDER — MOMETASONE FURO-FORMOTEROL FUM 200-5 MCG/ACT IN AERO: 2.0000 | INHALATION_SPRAY | Freq: Two times a day (BID) | RESPIRATORY_TRACT | Status: DC | PRN

## 2022-01-04 MED ORDER — SODIUM CHLORIDE 0.9 % IV SOLN: INTRAVENOUS | Status: DC

## 2022-01-04 MED ORDER — ACETAMINOPHEN 650 MG RE SUPP: 650.0000 mg | RECTAL | Status: DC | PRN

## 2022-01-04 MED ORDER — SENNOSIDES-DOCUSATE SODIUM 8.6-50 MG PO TABS: 1.0000 | ORAL_TABLET | Freq: Every evening | ORAL | Status: DC | PRN

## 2022-01-04 MED ORDER — UMECLIDINIUM BROMIDE 62.5 MCG/ACT IN AEPB: 1.0000 | INHALATION_SPRAY | Freq: Every day | RESPIRATORY_TRACT | Status: DC | PRN

## 2022-01-04 MED ORDER — ASPIRIN 81 MG PO CHEW
81.0000 mg | CHEWABLE_TABLET | Freq: Every day | ORAL | Status: DC
  Administered 2022-01-04: 81 mg via ORAL
  Filled 2022-01-04: qty 1

## 2022-01-04 MED ORDER — ACETAMINOPHEN 160 MG/5ML PO SOLN: 650.0000 mg | ORAL | Status: DC | PRN

## 2022-01-04 MED ORDER — TICAGRELOR 90 MG PO TABS
90.0000 mg | ORAL_TABLET | Freq: Two times a day (BID) | ORAL | Status: DC
  Administered 2022-01-04 – 2022-01-05 (×3): 90 mg via ORAL
  Filled 2022-01-04 (×3): qty 1

## 2022-01-04 MED ORDER — ASPIRIN 81 MG PO CHEW
81.0000 mg | CHEWABLE_TABLET | Freq: Every morning | ORAL | Status: DC
  Administered 2022-01-05: 81 mg via ORAL
  Filled 2022-01-04: qty 1

## 2022-01-04 MED ORDER — STROKE: EARLY STAGES OF RECOVERY BOOK
Freq: Once | Status: AC
  Filled 2022-01-04: qty 1

## 2022-01-04 MED ORDER — IOHEXOL 350 MG/ML SOLN
100.0000 mL | Freq: Once | INTRAVENOUS | Status: AC | PRN
  Administered 2022-01-04: 100 mL via INTRAVENOUS

## 2022-01-04 MED ORDER — PAROXETINE HCL 20 MG PO TABS
40.0000 mg | ORAL_TABLET | Freq: Every morning | ORAL | Status: DC
  Administered 2022-01-05: 40 mg via ORAL
  Filled 2022-01-04: qty 2

## 2022-01-04 MED ORDER — BUDESON-GLYCOPYRROL-FORMOTEROL 160-9-4.8 MCG/ACT IN AERO: 2.0000 | INHALATION_SPRAY | Freq: Two times a day (BID) | RESPIRATORY_TRACT | Status: DC | PRN

## 2022-01-04 MED ORDER — SODIUM CHLORIDE 0.9% FLUSH
3.0000 mL | Freq: Once | INTRAVENOUS | Status: AC
  Administered 2022-01-04: 3 mL via INTRAVENOUS

## 2022-01-04 MED ORDER — ACETAMINOPHEN 325 MG PO TABS: 650.0000 mg | ORAL_TABLET | ORAL | Status: DC | PRN

## 2022-01-04 MED ORDER — EZETIMIBE 10 MG PO TABS
10.0000 mg | ORAL_TABLET | Freq: Every evening | ORAL | Status: DC
  Administered 2022-01-04: 10 mg via ORAL
  Filled 2022-01-04: qty 1

## 2022-01-04 NOTE — ED Notes (Signed)
ED TO INPATIENT HANDOFF REPORT  ED Nurse Name and Phone #: Daphine Deutscher [phone]  S Name/Age/Gender Tiffany Horn 61 y.o. female Room/Bed: 025C/025C  Code Status   Code Status: Full Code  Home/SNF/Other Home Patient oriented to: self, place, time, and situation Is this baseline? Yes   Triage Complete: Triage complete  Chief Complaint Intractable headache [R51.9]  Triage Note No notes on file   Allergies Allergies  Allergen Reactions   Abilify [Aripiprazole] Nausea And Vomiting   Amoxil [Amoxicillin] Diarrhea   Promethazine Hcl Other (See Comments)    Made pt feel "weird"   Seroquel [Quetiapine Fumarate] Other (See Comments)    Pt does not recall reaction    Level of Care/Admitting Diagnosis ED Disposition     ED Disposition  [place]: [place] [[id number]]  Level of Care: Telemetry Medical [104]  May place patient in observation at [facility] or [place] if equivalent level of care is available:: No  Covid Evaluation: Asymptomatic - no recent exposure (last 10 days) testing not required  Diagnosis: Intractable headache [102725]  Admitting Physician: Karmen Bongo [[id number]]  Attending Physician: Karmen Bongo [[id number]]          B Medical/Surgery History Past Medical History:  Diagnosis Date   Anxiety    Arthritis    knees and spine, shoulder   Asthma    Bronchitis    hx - recurrent   Complication of anesthesia    waking up is not easy   Depression    Elevated IgE level 09/12/2017   09/12/2017 IgE 195   History of COVID-19 12/30/2020   Hx of irritable bowel syndrome    x2   Hyperlipidemia    diet controlled - no medication   Hypertension    not taking any meds at present - under control per patient   Hypokalemia    with PNA admission (2.5)   Hypothyroidism (acquired)    Ischemic cerebrovascular accident (CVA) ([place])    Migraines    Pneumonia    4 episodes; hosp. admission 2014    PONV (postoperative nausea and vomiting)    TIA (transient ischemic attack) 12/17/2020   UC (ulcerative colitis) [facility])    DX'D 2021   Past Surgical History:  Procedure Laterality Date   BREAST SURGERY     implants, then had them removed   COLONOSCOPY     greater 10 yrs ago - ? [facility]-2017 LAST   DILATION AND CURETTAGE OF UTERUS     IR ANGIO INTRA EXTRACRAN SEL INTERNAL CAROTID BILAT MOD SED  11/19/2020   IR ANGIO VERTEBRAL SEL VERTEBRAL BILAT MOD SED  11/19/2020   IR ANGIO VERTEBRAL SEL VERTEBRAL UNI L MOD SED  02/25/2021   IR ANGIOGRAM FOLLOW UP STUDY  11/19/2020   IR ANGIOGRAM FOLLOW UP STUDY  11/19/2020   IR ANGIOGRAM FOLLOW UP STUDY  11/19/2020   IR ANGIOGRAM FOLLOW UP STUDY  11/19/2020   IR ANGIOGRAM FOLLOW UP STUDY  11/19/2020   IR ANGIOGRAM FOLLOW UP STUDY  11/19/2020   IR ANGIOGRAM FOLLOW UP STUDY  11/19/2020   IR ANGIOGRAM FOLLOW UP STUDY  11/19/2020   IR INTRA CRAN STENT  11/19/2020   IR TRANSCATH/EMBOLIZ  11/19/2020   LAPAROSCOPIC ABDOMINAL EXPLORATION  01/31/1992   endometriosis   ORIF HUMERUS FRACTURE Left 04/01/2013   DR Ninfa Linden - shoulder   ORIF HUMERUS FRACTURE Left 04/01/2013   Procedure: OPEN REDUCTION INTERNAL FIXATION (  ORIF) LEFT PROXIMAL HUMERUS FRACTURE;  Surgeon: Mcarthur Rossetti, MD;  Location: [facility];  Service: Orthopedics;  Laterality: Left;   RADIOLOGY WITH ANESTHESIA N/A 11/19/2020   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;  Surgeon: Consuella Lose, MD;  Location: [facility];  Service: Radiology;  Laterality: N/A;   TONSILLECTOMY AND ADENOIDECTOMY       A IV Location/Drains/Wounds Patient Lines/Drains/Airways Status     Active Line/Drains/Airways     Name Placement date Placement time Site Days   Peripheral IV 01/04/22 18 G Left Antecubital 01/04/22  1000  Antecubital  less than 1   Wound / Incision (Open or Dehisced) 02/25/21 Puncture Groin Anterior;Proximal;Right Arterial access puncture site 02/25/21  1100  Groin   313            Intake/Output Last 24 hours No intake or output data in the 24 hours ending 01/04/22 1929  Labs/Imaging Results for orders placed or performed during the hospital encounter of 01/04/22 (from the past 48 hour(s))  Protime-INR     Status: None   Collection Time: 01/04/22  9:47 AM  Result Value Ref Range   Prothrombin Time 12.6 11.4 - 15.2 seconds   INR 1.0 0.8 - 1.2    Comment: (NOTE) INR goal varies based on device and disease states. Performed at [facility], [place] [address] [place], [place] [address]   APTT     Status: None   Collection Time: 01/04/22  9:47 AM  Result Value Ref Range   aPTT 26 24 - 36 seconds    Comment: Performed at [place] [address] [place], [place] [address]  CBC     Status: None   Collection Time: 01/04/22  9:47 AM  Result Value Ref Range   WBC 5.0 4.0 - 10.5 K/uL   RBC 4.69 3.87 - 5.11 MIL/uL   Hemoglobin 14.7 12.0 - 15.0 g/dL   HCT 42.6 36.0 - 46.0 %   MCV 90.8 80.0 - 100.0 fL   MCH 31.3 26.0 - 34.0 pg   MCHC 34.5 30.0 - 36.0 g/dL   RDW 12.7 11.5 - 15.5 %   Platelets 207 150 - 400 K/uL   nRBC 0.0 0.0 - 0.2 %    Comment: Performed at [facility], [place] [address] [place], [place] [address]  Differential     Status: None   Collection Time: 01/04/22  9:47 AM  Result Value Ref Range   Neutrophils Relative % 52 %   Neutro Abs 2.6 1.7 - 7.7 K/uL   Lymphocytes Relative 36 %   Lymphs Abs 1.8 0.7 - 4.0 K/uL   Monocytes Relative 8 %   Monocytes Absolute 0.4 0.1 - 1.0 K/uL   Eosinophils Relative 3 %   Eosinophils Absolute 0.2 0.0 - 0.5 K/uL   Basophils Relative 1 %   Basophils Absolute 0.1 0.0 - 0.1 K/uL   Immature Granulocytes 0 %   Abs Immature Granulocytes 0.01 0.00 - 0.07 K/uL    Comment: Performed at [facility], [place] [address] [place], [place] [address]  Comprehensive metabolic panel     Status: Abnormal   Collection Time: 01/04/22  9:47 AM  Result Value Ref Range   Sodium 140 135 -  145 mmol/L   Potassium 3.7 3.5 - 5.1 mmol/L   Chloride 108 98 - 111 mmol/L   CO2 25 22 - 32 mmol/L   Glucose, Bld 107 (H) 70 - 99 mg/dL  Comment: Glucose reference range applies only to samples taken after fasting for at least 8 hours.   BUN 11 6 - 20 mg/dL   Creatinine, Ser 0.76 0.44 - 1.00 mg/dL   Calcium 9.1 8.9 - 10.3 mg/dL   Total Protein 6.9 6.5 - 8.1 g/dL   Albumin 3.9 3.5 - 5.0 g/dL   AST 19 15 - 41 U/L   ALT 19 0 - 44 U/L   Alkaline Phosphatase 54 38 - 126 U/L   Total Bilirubin 0.5 0.3 - 1.2 mg/dL   GFR, Estimated >60 >60 mL/min    Comment: (NOTE) Calculated using the CKD-EPI Creatinine Equation (2021)    Anion gap 7 5 - 15    Comment: Performed at [place] [address], [place], [place] [address]  Ethanol     Status: None   Collection Time: 01/04/22  9:47 AM  Result Value Ref Range   Alcohol, Ethyl (B) <10 <10 mg/dL    Comment: (NOTE) Lowest detectable limit for serum alcohol is 10 mg/dL.  For medical purposes only. Performed at [facility], [place] [address] [place], [place] [address]   CBG monitoring, ED     Status: Abnormal   Collection Time: 01/04/22  9:54 AM  Result Value Ref Range   Glucose-Capillary 106 (H) 70 - 99 mg/dL    Comment: Glucose reference range applies only to samples taken after fasting for at least 8 hours.  I-stat chem 8, ED     Status: Abnormal   Collection Time: 01/04/22  9:58 AM  Result Value Ref Range   Sodium 141 135 - 145 mmol/L   Potassium 3.8 3.5 - 5.1 mmol/L   Chloride 106 98 - 111 mmol/L   BUN 11 6 - 20 mg/dL   Creatinine, Ser 0.70 0.44 - 1.00 mg/dL   Glucose, Bld 107 (H) 70 - 99 mg/dL    Comment: Glucose reference range applies only to samples taken after fasting for at least 8 hours.   Calcium, Ion 1.19 1.15 - 1.40 mmol/L   TCO2 23 22 - 32 mmol/L   Hemoglobin 14.6 12.0 - 15.0 g/dL   HCT 43.0 36.0 - 46.0 %  I-Stat beta hCG blood, ED     Status: None   Collection Time: 01/04/22 10:01 AM  Result Value  Ref Range   I-stat hCG, quantitative <5.0 <5 mIU/mL   Comment 3            Comment:   GEST. AGE      CONC.  (mIU/mL)   <=1 WEEK        5 - 50     2 WEEKS       50 - 500     3 WEEKS       100 - 10,000     4 WEEKS     1,000 - 30,000        FEMALE AND NON-PREGNANT FEMALE:     LESS THAN 5 mIU/mL    ECHOCARDIOGRAM COMPLETE  Result Date: 01/04/2022    ECHOCARDIOGRAM REPORT   Patient Name:   FERRAH PANAGOPOULOS Date of Exam: 01/04/2022 Medical Rec #:  [id number]        Height:       65.5 in Accession #:    [id number]       Weight:       175.0 lb Date of Birth:  11-17-1960        BSA:  1.880 m Patient Age:    61 years         BP:           145/120 mmHg Patient Gender: F                HR:           64 bpm. Exam Location:  Inpatient Procedure: 2D Echo, Color Doppler and Cardiac Doppler Indications:    TIA  History:        Patient has prior history of Echocardiogram examinations, most                 recent 12/05/2020. Risk Factors:Hypertension and Dyslipidemia.  Sonographer:    Johny Chess RDCS Referring Phys: [place]  1. Left ventricular ejection fraction, by estimation, is 55 to 60%. The left ventricle has normal function. The left ventricle has no regional wall motion abnormalities. Left ventricular diastolic parameters are consistent with Grade I diastolic dysfunction (impaired relaxation).  2. Right ventricular systolic function is normal. The right ventricular size is normal. Tricuspid regurgitation signal is inadequate for assessing PA pressure.  3. The mitral valve is normal in structure. No evidence of mitral valve regurgitation. No evidence of mitral stenosis.  4. The aortic valve is tricuspid. Aortic valve regurgitation is not visualized. No aortic stenosis is present.  5. The inferior vena cava is normal in size with greater than 50% respiratory variability, suggesting right atrial pressure of 3 mmHg. FINDINGS  Left Ventricle: Left ventricular ejection fraction, by  estimation, is 55 to 60%. The left ventricle has normal function. The left ventricle has no regional wall motion abnormalities. The left ventricular internal cavity size was normal in size. There is  no left ventricular hypertrophy. Left ventricular diastolic parameters are consistent with Grade I diastolic dysfunction (impaired relaxation). Right Ventricle: The right ventricular size is normal. No increase in right ventricular wall thickness. Right ventricular systolic function is normal. Tricuspid regurgitation signal is inadequate for assessing PA pressure. Left Atrium: Left atrial size was normal in size. Right Atrium: Right atrial size was normal in size. Pericardium: There is no evidence of pericardial effusion. Mitral Valve: The mitral valve is normal in structure. No evidence of mitral valve regurgitation. No evidence of mitral valve stenosis. Tricuspid Valve: The tricuspid valve is normal in structure. Tricuspid valve regurgitation is not demonstrated. Aortic Valve: The aortic valve is tricuspid. Aortic valve regurgitation is not visualized. No aortic stenosis is present. Pulmonic Valve: The pulmonic valve was normal in structure. Pulmonic valve regurgitation is not visualized. Aorta: The aortic root is normal in size and structure. Venous: The inferior vena cava is normal in size with greater than 50% respiratory variability, suggesting right atrial pressure of 3 mmHg. IAS/Shunts: No atrial level shunt detected by color flow Doppler.  LEFT VENTRICLE PLAX 2D LVIDd:         4.20 cm   Diastology LVIDs:         2.80 cm   LV e' medial:    10.10 cm/s LV PW:         0.90 cm   LV E/e' medial:  7.8 LV IVS:        1.00 cm   LV e' lateral:   13.50 cm/s LVOT diam:     1.70 cm   LV E/e' lateral: 5.8 LV SV:         46 LV SV Index:   24 LVOT Area:     2.27 cm  RIGHT VENTRICLE             IVC RV Basal diam:  2.80 cm     IVC diam: 1.20 cm RV S prime:     16.20 cm/s TAPSE (M-mode): 2.3 cm LEFT ATRIUM             Index         RIGHT ATRIUM           Index LA diam:        3.20 cm 1.70 cm/m   RA Area:     10.50 cm LA Vol (A2C):   24.3 ml 12.93 ml/m  RA Volume:   23.40 ml  12.45 ml/m LA Vol (A4C):   28.8 ml 15.32 ml/m LA Biplane Vol: 26.5 ml 14.10 ml/m  AORTIC VALVE LVOT Vmax:   95.30 cm/s LVOT Vmean:  59.000 cm/s LVOT VTI:    0.201 m  AORTA Ao Root diam: 3.20 cm Ao Asc diam:  3.30 cm MITRAL VALVE MV Area (PHT): 3.65 cm    SHUNTS MV Decel Time: 208 msec    Systemic VTI:  0.20 m MV E velocity: 78.60 cm/s  Systemic Diam: 1.70 cm MV A velocity: 77.90 cm/s MV E/A ratio:  1.01 Dalton McleanMD Electronically signed by Franki Monte Signature Date/Time: 01/04/2022/5:36:25 PM    Final    MR BRAIN WO CONTRAST  Result Date: 01/04/2022 CLINICAL DATA:  Headache.  Dizziness.  Nausea. EXAM: MRI HEAD WITHOUT CONTRAST TECHNIQUE: Multiplanar, multiecho pulse sequences of the brain and surrounding structures were obtained without intravenous contrast. COMPARISON:  Same-day CT brain and angiogram. FINDINGS: Brain: No acute infarction, hydrocephalus, extra-axial collection or mass lesion. There is a small punctate focus of microhemorrhage in the posterior right temporal lobe (series 7, image 55). 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 Otherwise normal flow voids. Skull and upper cervical spine: Normal marrow signal. Sinuses/Orbits: Negative. Other: None IMPRESSION: 1. No acute intracranial abnormality. 2. 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 Electronically Signed   By: Marin Roberts M.D.   On: 01/04/2022 17:06   CT ANGIO HEAD NECK W WO CM W PERF (CODE STROKE)  Result Date: 01/04/2022 CLINICAL DATA:  Provided history: Neuro deficit, acute, stroke suspected. Left-sided weakness and numbness, headache, prior basilar aneurysm coiling. EXAM: CT ANGIOGRAPHY HEAD AND NECK CT PERFUSION BRAIN TECHNIQUE: Multidetector CT imaging of the head and neck was performed using the  standard protocol during bolus administration of intravenous contrast. Multiplanar CT image reconstructions and MIPs were obtained to evaluate the vascular anatomy. Carotid stenosis measurements (when applicable) are obtained utilizing NASCET criteria, using the distal internal carotid diameter as the denominator. Multiphase CT imaging of the brain was performed following IV bolus contrast injection. Subsequent parametric perfusion maps were calculated using RAPID software. RADIATION DOSE REDUCTION: This exam was performed according to the departmental dose-optimization program which includes automated exposure control, adjustment of the mA and/or kV according to patient size and/or use of iterative reconstruction technique. CONTRAST:  131m OMNIPAQUE IOHEXOL 350 MG/ML SOLN COMPARISON:  None Available. Noncontrast head CT performed earlier today 01/04/2022. CT angiogram head/neck 07/05/2021. FINDINGS: CTA NECK FINDINGS Aortic arch: The visualized aortic arch is normal in caliber. Aberrant right subclavian artery. No hemodynamically significant innominate or proximal subclavian artery stenosis. Right carotid system: CCA and ICA patent within the neck without stenosis or significant atherosclerotic disease. Partially retropharyngeal course of the cervical ICA. Left carotid system: CCA and ICA patent within the neck without stenosis. Minimal  atherosclerotic plaque about the carotid bifurcation. Vertebral arteries: Vertebral arteries patent within the neck without stenosis or significant atherosclerotic disease. The left vertebral artery is slightly dominant. Skeleton: Cervical spondylosis. No acute fracture or aggressive osseous lesion. Other neck: No neck mass or cervical lymphadenopathy. Upper chest: No consolidation within the imaged lung apices. Review of the MIP images confirms the above findings CTA HEAD FINDINGS Anterior circulation: The intracranial internal carotid arteries are patent. The M1 middle cerebral  arteries are patent. No M2 proximal branch occlusion or high-grade proximal stenosis. The anterior cerebral arteries are patent. Hypoplastic right A1 segment. Posterior circulation: The intracranial vertebral arteries are patent. 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 The stent extends from the distal basilar artery into the bilateral posterior cerebral arteries to the P2 segment level. Streak and beam hardening artifact arising from the coil mass obscures the very distal basilar artery. Within this limitation, the basilar artery is patent without appreciable stenosis. Within the limitations of prominent streak and beam hardening artifact, there is no visible residual or recurrent basilar tip aneurysm. The posterior cerebral arteries are patent. Artifact partially obscures the bilateral P1 segments. Since the prior CTA of 07/25/2021, there has been interval progression of a markedly severe stenosis within the right P2 segment (immediately beyond the stent). No appreciable high-grade proximal left PCA stenosis. Venous sinuses: Within the limitations of contrast timing, no convincing thrombus. Anatomic variants: As described. Review of the MIP images confirms the above findings CT Brain Perfusion Findings: CBF (<30%) Volume: 39m Perfusion (Tmax>6.0s) volume: 058m(however, a 16 mL region of decreased perfusion is noted within the right PCA vascular territory using the Tmax>4 seconds threshold. Mismatch Volume: 32m92mnfarction Location:None identified These results were called by telephone at the time of interpretation on 01/04/2022 at 10:15 am to provider [facility] verb[facility] acknowledged these results. IMPRESSION: CTA neck: 1. The common carotid and internal carotid arteries are patent within the neck. Minimal atherosclerotic plaque about the left carotid bifurcation. 2. The vertebral arteries are patent within the neck without stenosis or significant atherosclerotic disease. 3.  Aberrant right subclavian artery. CTA head: 1. Redemonstrated sequelae of prior stent assisted coil embolization of a basilar tip aneurysm. There is prominent streak and beam hardening artifact arising from the embolization coil mass. Within this limitation, there is no visible residual or recurrent aneurysm. 2. No intracranial large vessel occlusion is identified. 3. A markedly severe stenosis within the right PCA P2 segment (just beyond the stent) has progressed from the prior CTA of 07/25/2021. CT perfusion head: The perfusion software identifies no core infarct. The perfusion software identifies no critically hypoperfused parenchyma (utilizing the Tmax>6 seconds threshold). However, a 16 mL region of hypoperfusion is noted within the right PCA vascular territory using the Tmax > 4 seconds threshold. Electronically Signed   By: KylKellie SimmeringO.   On: 01/04/2022 10:54   CT HEAD CODE STROKE WO CONTRAST  Result Date: 01/04/2022 CLINICAL DATA:  Code stroke. [age]ar old female left arm weakness. History of endovascular treated basilar tip aneurysm. EXAM: CT HEAD WITHOUT CONTRAST TECHNIQUE: Contiguous axial images were obtained from the base of the skull through the vertex without intravenous contrast. RADIATION DOSE REDUCTION: This exam was performed according to the departmental dose-optimization program which includes automated exposure control, adjustment of the mA and/or kV according to patient size and/or use of iterative reconstruction technique. COMPARISON:  CTA head and neck 07/25/2021. brain MRI 05/07/2021. FINDINGS: Brain: Basilar tip aneurysm coil pack with  stable streak artifact. Stable gray-white matter differentiation throughout the brain. No midline shift, ventriculomegaly, mass effect, evidence of mass lesion, intracranial hemorrhage or evidence of cortically based acute infarction. No convincing encephalomalacia. Vascular: Sequelae of stent assisted basilar tip aneurysm coil embolization.  Subsequent streak artifact mostly obscures the carotid termini and proximal MCAs. Skull: No acute osseous abnormality identified. Sinuses/Orbits: Visualized paranasal sinuses and mastoids are stable and well aerated. Other: Visualized orbits and scalp soft tissues are within normal limits. ASPECTS [facility] Stroke Program Early CT Score) Total score (0-10 with 10 being normal): 10 IMPRESSION: 1. Stable non contrast CT appearance of the brain, negative except for sequelae of stent assisted basilar tip aneurysm embolization. ASPECTS 10. 2. These results were communicated to Dr. Quinn Axe at 10:05 am on 01/04/2022 by text page via the [facility] messaging system. Electronically Signed   By: Genevie Ann M.D.   On: 01/04/2022 10:06    Pending Labs Unresulted Labs (From admission, onward)     Start     Ordered   01/05/22 0500  Lipid panel  (Labs)  Tomorrow morning,   R       Comments: Fasting    01/04/22 1502   01/04/22 1501  HIV Antibody (routine testing w rflx)  (HIV Antibody (Routine testing w reflex) panel)  Once,   R        01/04/22 1502   01/04/22 1501  Hemoglobin A1c  (Labs)  Once,   R       Comments: To assess prior glycemic control    01/04/22 1502            Vitals/Pain Today's Vitals   01/04/22 1705 01/04/22 1710 01/04/22 1715 01/04/22 1730  BP: (!) 139/93   (!) 149/87  Pulse: 69 70 64 65  Resp: 18 14 12 15   Temp: 98.1 F (36.7 C)     TempSrc: Oral     SpO2: 96% 97% 98% 98%  Weight:      PainSc: 0-No pain       Isolation Precautions No active isolations  Medications Medications  ticagrelor (BRILINTA) tablet 90 mg (90 mg Oral Given 01/04/22 1335)  aspirin chewable tablet 81 mg (has no administration in time range)  atorvastatin (LIPITOR) tablet 80 mg (80 mg Oral Given 01/04/22 1709)  ezetimibe (ZETIA) tablet 10 mg (10 mg Oral Given 01/04/22 1709)  PARoxetine (PAXIL) tablet 40 mg (has no administration in time range)  levothyroxine (SYNTHROID) tablet 125 mcg (has no administration in  time range)  clonazePAM (KLONOPIN) tablet 0.5 mg (0.5 mg Oral Given 01/04/22 1709)  Budeson-Glycopyrrol-Formoterol 160-9-4.8 MCG/ACT AERO 2 puff (has no administration in time range)  enoxaparin (LOVENOX) injection 40 mg (40 mg Subcutaneous Given 01/04/22 1711)  0.9 %  sodium chloride infusion ( Intravenous New Bag/Given 01/04/22 1714)  acetaminophen (TYLENOL) tablet 650 mg (has no administration in time range)    Or  acetaminophen (TYLENOL) 160 MG/5ML solution 650 mg (has no administration in time range)    Or  acetaminophen (TYLENOL) suppository 650 mg (has no administration in time range)  senna-docusate (Senokot-S) tablet 1 tablet (has no administration in time range)  sodium chloride flush (NS) 0.9 % injection 3 mL (3 mLs Intravenous Given 01/04/22 1027)  iohexol (OMNIPAQUE) 350 MG/ML injection 100 mL (100 mLs Intravenous Contrast Given 01/04/22 1012)  prochlorperazine (COMPAZINE) injection 10 mg (10 mg Intravenous Given 01/04/22 1036)   stroke: early stages of recovery book ( Does not apply Given 01/04/22 1824)    Mobility walks  Low fall risk   Focused Assessments                    Neuro Assessment Handoff:  Swallow screen pass? Yes  Cardiac Rhythm: Normal sinus rhythm NIH Stroke Scale ( + Modified Stroke Scale Criteria)  Interval: Initial Level of Consciousness (1a.)   : Alert, keenly responsive LOC Questions (1b. )   +: Answers both questions correctly LOC Commands (1c. )   + : Performs both tasks correctly Best Gaze (2. )  +: Normal Visual (3. )  +: No visual loss Facial Palsy (4. )    : Normal symmetrical movements Motor Arm, Left (5a. )   +: No drift Motor Arm, Right (5b. )   +: No drift Motor Leg, Left (6a. )   +: No drift Motor Leg, Right (6b. )   +: No drift Limb Ataxia (7. ): Absent Sensory (8. )   +: Normal, no sensory loss Best Language (9. )   +: No aphasia Dysarthria (10. ): Normal Extinction/Inattention (11.)   +: No Abnormality Modified SS Total  +:  0 Complete NIHSS TOTAL: 0 Last date known well: 01/04/22 Last time known well: 0900 Neuro Assessment: Exceptions to WDL Neuro Checks:   Initial (01/04/22 1000)  Last Documented NIHSS Modified Score: 0 (01/04/22 1918) Has TPA been given? No If patient is a Neuro Trauma and patient is going to OR before floor call report to [facility] nurse: [phone] or [phone]   R Recommendations: See Admitting Provider Note  Report given to:   Additional Notes:

## 2022-01-04 NOTE — ED Notes (Signed)
Up to b/r, steady gait, back to stretcher, neuro in to see, update.

## 2022-01-04 NOTE — H&P (Signed)
History and Physical    Patient: Tiffany Horn BMW:[id number] DOB: 10-Mar-1960 DOA: 01/04/2022 DOS: the patient was seen and examined on 01/04/2022 PCP: [name], DO  Patient coming from: Home - lives alone; NOK: [place], [phone]   Chief Complaint: Headache  HPI: Tiffany Horn is a 61 y.o. 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  She reports that she developed a headache with dizziness and nausea.  Symptoms started about 0900.  She did have a headache yesterday and last night but the other symptoms started at work today.  No dysphagia or dysarthria.  She did have some tingling this AM in the back of her neck and her L fingers.  Also with diplopia.    Her only aneurysm was in 10/2020, underwent coiling by Dr. Kathyrn Sheriff and then had a stent placed.  She saw Dr. Kathyrn Sheriff Monday and he was planning for an angiogram Monday to f/u.      ER Course:  h/o cerebral aneurysm, here with numbness/weakess/headache.  Head CT negative, CTA with ?need for intervention, severe stenosis past the prior stent.       Review of Systems: As mentioned in the history of present illness. All other systems reviewed and are negative. Past Medical History:  Diagnosis Date   Anxiety    Arthritis    knees and spine, shoulder   Asthma    Bronchitis    hx - recurrent   Complication of anesthesia    waking up is not easy   Depression    Elevated IgE level 09/12/2017   09/12/2017 IgE 195   History of COVID-19 12/30/2020   Hx of irritable bowel syndrome    x2   Hyperlipidemia    diet controlled - no medication   Hypertension    not taking any meds at present - under control per patient   Hypokalemia    with PNA admission (2.5)   Hypothyroidism (acquired)    Ischemic cerebrovascular accident (CVA) ([place])    Migraines    Pneumonia    4 episodes; hosp. admission 2014   PONV (postoperative nausea  and vomiting)    TIA (transient ischemic attack) 12/17/2020   UC (ulcerative colitis) [facility]    DX'D 2021   Past Surgical History:  Procedure Laterality Date   BREAST SURGERY     implants, then had them removed   COLONOSCOPY     greater 10 yrs ago - ? [facility]-2017 LAST   DILATION AND CURETTAGE OF UTERUS     IR ANGIO INTRA EXTRACRAN SEL INTERNAL CAROTID BILAT MOD SED  11/19/2020   IR ANGIO VERTEBRAL SEL VERTEBRAL BILAT MOD SED  11/19/2020   IR ANGIO VERTEBRAL SEL VERTEBRAL UNI L MOD SED  02/25/2021   IR ANGIOGRAM FOLLOW UP STUDY  11/19/2020   IR ANGIOGRAM FOLLOW UP STUDY  11/19/2020   IR ANGIOGRAM FOLLOW UP STUDY  11/19/2020   IR ANGIOGRAM FOLLOW UP STUDY  11/19/2020   IR ANGIOGRAM FOLLOW UP STUDY  11/19/2020   IR ANGIOGRAM FOLLOW UP STUDY  11/19/2020   IR ANGIOGRAM FOLLOW UP STUDY  11/19/2020   IR ANGIOGRAM FOLLOW UP STUDY  11/19/2020   IR INTRA CRAN STENT  11/19/2020   IR TRANSCATH/EMBOLIZ  11/19/2020   LAPAROSCOPIC ABDOMINAL EXPLORATION  01/31/1992   endometriosis   ORIF HUMERUS FRACTURE Left 04/01/2013   DR Ninfa Linden - shoulder   ORIF HUMERUS FRACTURE Left 04/01/2013   Procedure:  OPEN REDUCTION INTERNAL FIXATION (ORIF) LEFT PROXIMAL HUMERUS FRACTURE;  Surgeon: Mcarthur Rossetti, MD;  Location: [place];  Service: Orthopedics;  Laterality: Left;   RADIOLOGY WITH ANESTHESIA N/A 11/19/2020   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;  Surgeon: Consuella Lose, MD;  Location: [place];  Service: Radiology;  Laterality: N/A;   TONSILLECTOMY AND ADENOIDECTOMY     Social History:  reports that she has never smoked. She has never been exposed to tobacco smoke. She has never used smokeless tobacco. She reports current alcohol use of about 1.0 standard drink of alcohol per week. She reports that she does not use drugs.  Allergies  Allergen Reactions   Abilify [Aripiprazole] Nausea And Vomiting   Amoxil [Amoxicillin] Diarrhea   Promethazine Hcl Other (See  Comments)    Made pt feel "weird"   Seroquel [Quetiapine Fumarate] Other (See Comments)    Pt does not recall reaction    Family History  Problem Relation Age of Onset   Breast cancer Mother    Diabetes Mother    Heart disease Mother    Diabetes Father    Dementia Father    Colon cancer Father    Stroke Paternal Grandmother    Diabetes Paternal Grandmother    Colon polyps Neg Hx    Esophageal cancer Neg Hx    Stomach cancer Neg Hx    Rectal cancer Neg Hx     Prior to Admission medications   Medication Sig Start Date End Date Taking? Authorizing Provider  alendronate (FOSAMAX) 70 MG tablet Take 1 tablet (70 mg total) by mouth every 7 (seven) days. Take with a full glass of water on an empty stomach. 09/13/21   Kuneff, Renee A, DO  amLODipine (NORVASC) 5 MG tablet Take 1 tablet (5 mg total) by mouth daily. 09/13/21   Kuneff, Renee A, DO  Ascorbic Acid (VITAMIN C) 1000 MG tablet Take 1,000 mg by mouth daily.    [provider]  aspirin 81 MG chewable tablet Chew 1 tablet (81 mg total) by mouth daily. 11/22/20   Consuella Lose, MD  atorvastatin (LIPITOR) 80 MG tablet Take 1 tablet (80 mg total) by mouth daily. 09/13/21   Kuneff, Renee A, DO  B Complex Vitamins (B COMPLEX PO) Take 1 tablet by mouth daily.    [provider]  Budeson-Glycopyrrol-Formoterol (BREZTRI AEROSPHERE) 160-9-4.8 MCG/ACT AERO Inhale 2 puffs into the lungs in the morning and at bedtime. Patient taking differently: Inhale 2 puffs into the lungs 2 (two) times daily as needed (asthma). 04/28/20   Garner Nash, DO  Cholecalciferol (VITAMIN D) 50 MCG (2000 UT) tablet Take 2,000 Units by mouth daily.    [provider]  clonazePAM (KLONOPIN) 0.5 MG tablet Take 1 tablet (0.5 mg total) by mouth daily. 09/13/21   Kuneff, Renee A, DO  ezetimibe (ZETIA) 10 MG tablet Take 1 tablet (10 mg total) by mouth daily. 09/16/21   Kuneff, Renee A, DO  levothyroxine (SYNTHROID) 125 MCG tablet Take 1 tablet  (125 mcg total) by mouth daily. On an empty stomach. 09/14/21   Kuneff, Renee A, DO  PARoxetine (PAXIL) 40 MG tablet Take 1 tablet (40 mg total) by mouth daily. 09/13/21   Kuneff, Renee A, DO  rizatriptan (MAXALT) 5 MG tablet Take 1 tablet (5 mg total) by mouth daily as needed for migraine. 09/13/21   Kuneff, Renee A, DO  triamcinolone cream (KENALOG) 0.5 % Apply 1 Application topically 4 (four) times daily as needed (skin irritation).  07/10/21   [provider]    Physical Exam: Vitals:   01/04/22 1115 01/04/22 1145 01/04/22 1200 01/04/22 1340  BP: (!) 151/93 (!) 154/97 (!) 145/96 (!) 153/86  Pulse: 70 63 72 74  Resp: 13 18 18 14   SpO2: 100% 94% 95% 96%  Weight:       General:  Appears calm and comfortable and is in NAD Eyes:  PERRL, EOMI, normal lids, iris; + photophobia ENT:  grossly normal hearing, lips & tongue, mmm Neck:  no LAD, masses or thyromegaly Cardiovascular:  RRR, no m/r/g. No LE edema.  Respiratory:   CTA bilaterally with no wheezes/rales/rhonchi.  Normal respiratory effort. Abdomen:  soft, NT, ND Skin:  no rash or induration seen on limited exam Musculoskeletal:  grossly normal tone BUE/BLE, good ROM, no bony abnormality Psychiatric:  grossly normal mood and affect, speech fluent and appropriate, AOx3 Neurologic:  CN 2-12 grossly intact, moves all extremities in coordinated fashion   Radiological Exams on Admission: Independently reviewed - see discussion in A/P where applicable  CT ANGIO HEAD NECK W WO CM W PERF (CODE STROKE)  Result Date: 01/04/2022 CLINICAL DATA:  Provided history: Neuro deficit, acute, stroke suspected. Left-sided weakness and numbness, headache, prior basilar aneurysm coiling. EXAM: CT ANGIOGRAPHY HEAD AND NECK CT PERFUSION BRAIN TECHNIQUE: Multidetector CT imaging of the head and neck was performed using the standard protocol during bolus administration of intravenous contrast. Multiplanar CT image reconstructions and MIPs were obtained to  evaluate the vascular anatomy. Carotid stenosis measurements (when applicable) are obtained utilizing NASCET criteria, using the distal internal carotid diameter as the denominator. Multiphase CT imaging of the brain was performed following IV bolus contrast injection. Subsequent parametric perfusion maps were calculated using RAPID software. RADIATION DOSE REDUCTION: This exam was performed according to the departmental dose-optimization program which includes automated exposure control, adjustment of the mA and/or kV according to patient size and/or use of iterative reconstruction technique. CONTRAST:  158m OMNIPAQUE IOHEXOL 350 MG/ML SOLN COMPARISON:  None Available. Noncontrast head CT performed earlier today 01/04/2022. CT angiogram head/neck 07/05/2021. FINDINGS: CTA NECK FINDINGS Aortic arch: The visualized aortic arch is normal in caliber. Aberrant right subclavian artery. No hemodynamically significant innominate or proximal subclavian artery stenosis. Right carotid system: CCA and ICA patent within the neck without stenosis or significant atherosclerotic disease. Partially retropharyngeal course of the cervical ICA. Left carotid system: CCA and ICA patent within the neck without stenosis. Minimal atherosclerotic plaque about the carotid bifurcation. Vertebral arteries: Vertebral arteries patent within the neck without stenosis or significant atherosclerotic disease. The left vertebral artery is slightly dominant. Skeleton: Cervical spondylosis. No acute fracture or aggressive osseous lesion. Other neck: No neck mass or cervical lymphadenopathy. Upper chest: No consolidation within the imaged lung apices. Review of the MIP images confirms the above findings CTA HEAD FINDINGS Anterior circulation: The intracranial internal carotid arteries are patent. The M1 middle cerebral arteries are patent. No M2 proximal branch occlusion or high-grade proximal stenosis. The anterior cerebral arteries are patent.  Hypoplastic right A1 segment. Posterior circulation: The intracranial vertebral arteries are patent. 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 The stent extends from the distal basilar artery into the bilateral posterior cerebral arteries to the P2 segment level. Streak and beam hardening artifact arising from the coil mass obscures the very distal basilar artery. Within this limitation, the basilar artery is patent without appreciable stenosis. Within the limitations of prominent streak and beam hardening artifact, there is no visible residual  or recurrent basilar tip aneurysm. The posterior cerebral arteries are patent. Artifact partially obscures the bilateral P1 segments. Since the prior CTA of 07/25/2021, there has been interval progression of a markedly severe stenosis within the right P2 segment (immediately beyond the stent). No appreciable high-grade proximal left PCA stenosis. Venous sinuses: Within the limitations of contrast timing, no convincing thrombus. Anatomic variants: As described. Review of the MIP images confirms the above findings CT Brain Perfusion Findings: CBF (<30%) Volume: 86m Perfusion (Tmax>6.0s) volume: 027m(however, a 16 mL region of decreased perfusion is noted within the right PCA vascular territory using the Tmax>4 seconds threshold. Mismatch Volume: 74m33mnfarction Location:None identified These results were called by telephone at the time of interpretation on 01/04/2022 at 10:15 am to provider COLCircles Of Carewho verbally acknowledged these results. IMPRESSION: CTA neck: 1. The common carotid and internal carotid arteries are patent within the neck. Minimal atherosclerotic plaque about the left carotid bifurcation. 2. The vertebral arteries are patent within the neck without stenosis or significant atherosclerotic disease. 3. Aberrant right subclavian artery. CTA head: 1. Redemonstrated sequelae of prior stent assisted coil embolization of a basilar tip  aneurysm. There is prominent streak and beam hardening artifact arising from the embolization coil mass. Within this limitation, there is no visible residual or recurrent aneurysm. 2. No intracranial large vessel occlusion is identified. 3. A markedly severe stenosis within the right PCA P2 segment (just beyond the stent) has progressed from the prior CTA of 07/25/2021. CT perfusion head: The perfusion software identifies no core infarct. The perfusion software identifies no critically hypoperfused parenchyma (utilizing the Tmax>6 seconds threshold). However, a 16 mL region of hypoperfusion is noted within the right PCA vascular territory using the Tmax > 4 seconds threshold. Electronically Signed   By: KylKellie SimmeringO.   On: 01/04/2022 10:54   CT HEAD CODE STROKE WO CONTRAST  Result Date: 01/04/2022 CLINICAL DATA:  Code stroke. [age]ar old female left arm weakness. History of endovascular treated basilar tip aneurysm. EXAM: CT HEAD WITHOUT CONTRAST TECHNIQUE: Contiguous axial images were obtained from the base of the skull through the vertex without intravenous contrast. RADIATION DOSE REDUCTION: This exam was performed according to the departmental dose-optimization program which includes automated exposure control, adjustment of the mA and/or kV according to patient size and/or use of iterative reconstruction technique. COMPARISON:  CTA head and neck 07/25/2021. brain MRI 05/07/2021. FINDINGS: Brain: Basilar tip aneurysm coil pack with stable streak artifact. Stable gray-white matter differentiation throughout the brain. No midline shift, ventriculomegaly, mass effect, evidence of mass lesion, intracranial hemorrhage or evidence of cortically based acute infarction. No convincing encephalomalacia. Vascular: Sequelae of stent assisted basilar tip aneurysm coil embolization. Subsequent streak artifact mostly obscures the carotid termini and proximal MCAs. Skull: No acute osseous abnormality identified.  Sinuses/Orbits: Visualized paranasal sinuses and mastoids are stable and well aerated. Other: Visualized orbits and scalp soft tissues are within normal limits. ASPECTS ([facility] Program Early CT Score) Total score (0-10 with 10 being normal): 10 IMPRESSION: 1. Stable non contrast CT appearance of the brain, negative except for sequelae of stent assisted basilar tip aneurysm embolization. ASPECTS 10. 2. These results were communicated to Dr. StaQuinn Axe 10:05 am on 01/04/2022 by text page via the AM[facility] Hospitalssaging system. Electronically Signed   By: H  Genevie AnnD.   On: 01/04/2022 10:06    EKG: Independently reviewed.  NSR with rate 68; low voltage; nonspecific ST changes with no evidence of acute ischemia   Labs on  Admission: I have personally reviewed the available labs and imaging studies at the time of the admission.  Pertinent labs:    Glucose 107 Normal CBC   Assessment and Plan: Principal Problem:   Intractable headache Active Problems:   Hyperlipidemia   Hypothyroidism   Major depressive disorder, recurrent episode, mild (HCC)   Moderate persistent asthma   Hypertension    Headache -Patient with h/o cerebral aneurysm, had stent placed in 10/2020 -Here with headache, dizziness, nausea -CTA with severe stenosis past the stent -She was scheduled for an angiogram on 12/15 -Patient was d/w Dr. Kathyrn Sheriff, who has arranged for the angiogram tomorrow at noon instead -Aspirin has been given to reduce stroke mortality and decrease morbidity -Will place in observation status for CVA/TIA evaluation -Telemetry monitoring -MRI -Echo -Risk stratification with FLP, A1c -Neurology consult -PT/OT/Nutrition Consults  HTN -Allow permissive HTN for now -Treat BP only if >220/120, and then with goal of 15% reduction -Hold amlodipine and plan to restart in 48-72 hours   HLD -Check FLP -Resume statin, Lipitor 80 mg daily   Hypothyroidism -Continue Synthroid  Asthma -Continue prn  Breztri  Anxiety/Depression  -Continue prn Klonopin, daily paroxetine      Advance Care Planning:   Code Status: Full Code   Consults: Neurology; Neurosurgery; PT/OT; nutrition; TOC team  DVT Prophylaxis: Lovenox  Family Communication: None present; she is capable of communicating with family at this time (and her brother currently has influenza)  Severity of Illness: The appropriate patient status for this patient is OBSERVATION. Observation status is judged to be reasonable and necessary in order to provide the required intensity of service to ensure the patient's safety. The patient's presenting symptoms, physical exam findings, and initial radiographic and laboratory data in the context of their medical condition is felt to place them at decreased risk for further clinical deterioration. Furthermore, it is anticipated that the patient will be medically stable for discharge from the hospital within 2 midnights of admission.   Author: Karmen Bongo, MD 01/04/2022 3:06 PM  For on call review [email or web address].

## 2022-01-04 NOTE — ED Notes (Signed)
HA improved, left sided, back worse than front. Rates 6/10. Nausea and dizziness improved, however sx remain.

## 2022-01-04 NOTE — Consult Note (Signed)
NEUROLOGY CONSULTATION NOTE   Date of service: January 04, 2022 Patient Name: Tiffany Horn MRN:  [id number] DOB:  13-Jul-1960 Reason for consult: stroke code Requesting physician: Dr. 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 She is outside the window for TNK. Stroke scale 5 for weakness L face/arm/leg and sensory deficit, CT head NAICP. TNK not administered 2/2 patient outside the window.  CTA neck:   1. The common carotid and internal carotid arteries are patent within the neck. Minimal atherosclerotic plaque about the left carotid bifurcation. 2. The vertebral arteries are patent within the neck without stenosis or significant atherosclerotic disease. 3. Aberrant right subclavian artery.   CTA head:   1. Redemonstrated sequelae of prior stent assisted coil embolization of a basilar tip aneurysm. There is prominent streak and beam hardening artifact arising from the embolization coil mass. Within this limitation, there is no visible residual or recurrent aneurysm. 2. No intracranial large vessel occlusion is identified. 3. A markedly severe stenosis within the right PCA P2 segment (just beyond the stent) has progressed from the prior CTA of 07/25/2021.   CT perfusion head:   The perfusion software identifies no core infarct. The perfusion software identifies no critically hypoperfused parenchyma (utilizing the Tmax>6 seconds threshold). However, a 16 mL region of hypoperfusion is noted within the right PCA vascular territory using the Tmax > 4 seconds threshold.  CNS imaging personally reviewed; I agree with above interpretation   ROS   Per HPI: all other systems reviewed and are negative  Past History   I have reviewed the following:  Past Medical History:  Diagnosis Date   Allergy     Anxiety    Arthritis    knees and spine, shoulder   Asthma    Bronchitis    hx - recurrent   Complication of anesthesia    waking up is not easy   Depression    Elevated IgE level 09/12/2017   09/12/2017 IgE 195   H/O miscarriage, not currently pregnant    History of COVID-19 12/30/2020   Hx of irritable bowel syndrome    x2   Hyperlipidemia    diet controlled - no medication   Hypertension    not taking any meds at present - under control per patient   Hypokalemia    with PNA admission (2.5)   Ischemic cerebrovascular accident (CVA) ([place])    Migraines    Pneumonia    4 episodes; hosp. admission 2014   PONV (postoperative nausea and vomiting)    Thyroid disease    TIA (transient ischemic attack) 12/17/2020   UC (ulcerative colitis) [facility])    DX'D 2021   Past Surgical History:  Procedure Laterality Date   BREAST SURGERY     implants, then had them removed   COLONOSCOPY     greater 10 yrs ago - ? [facility]-2017 LAST   DILATION AND CURETTAGE OF UTERUS     IR ANGIO INTRA EXTRACRAN SEL INTERNAL CAROTID BILAT MOD SED  11/19/2020   IR ANGIO VERTEBRAL SEL VERTEBRAL BILAT MOD SED  11/19/2020   IR ANGIO VERTEBRAL SEL VERTEBRAL UNI L MOD SED  02/25/2021   IR ANGIOGRAM FOLLOW UP STUDY  11/19/2020   IR ANGIOGRAM  FOLLOW UP STUDY  11/19/2020   IR ANGIOGRAM FOLLOW UP STUDY  11/19/2020   IR ANGIOGRAM FOLLOW UP STUDY  11/19/2020   IR ANGIOGRAM FOLLOW UP STUDY  11/19/2020   IR ANGIOGRAM FOLLOW UP STUDY  11/19/2020   IR ANGIOGRAM FOLLOW UP STUDY  11/19/2020   IR ANGIOGRAM FOLLOW UP STUDY  11/19/2020   IR INTRA CRAN STENT  11/19/2020   IR TRANSCATH/EMBOLIZ  11/19/2020   LAPAROSCOPIC ABDOMINAL EXPLORATION  01/31/1992   endometriosis   ORIF HUMERUS FRACTURE Left 04/01/2013   DR Ninfa Linden - shoulder   ORIF HUMERUS FRACTURE Left 04/01/2013   Procedure: OPEN REDUCTION INTERNAL FIXATION (ORIF) LEFT PROXIMAL HUMERUS FRACTURE;  Surgeon: Mcarthur Rossetti, MD;  Location: [place];   Service: Orthopedics;  Laterality: Left;   RADIOLOGY WITH ANESTHESIA N/A 11/19/2020   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;  Surgeon: Consuella Lose, MD;  Location: [place];  Service: Radiology;  Laterality: N/A;   TONSILLECTOMY AND ADENOIDECTOMY     Family History  Problem Relation Age of Onset   Breast cancer Mother    Diabetes Mother    Heart disease Mother    Diabetes Father    Dementia Father    Colon cancer Father    Stroke Paternal Grandmother    Diabetes Paternal Grandmother    Colon polyps Neg Hx    Esophageal cancer Neg Hx    Stomach cancer Neg Hx    Rectal cancer Neg Hx    Social History   Socioeconomic History   Marital status: Divorced    Spouse name: Not on file   Number of children: 0   Years of education: Not on file   Highest education level: Bachelor's degree (e.g., BA, AB, BS)  Occupational History   Occupation: Control and instrumentation engineer  Tobacco Use   Smoking status: Never    Passive exposure: Never   Smokeless tobacco: Never  Vaping Use   Vaping Use: Never used  Substance and Sexual Activity   Alcohol use: Yes    Alcohol/week: 1.0 standard drink of alcohol    Types: 1 Glasses of wine per week   Drug use: No   Sexual activity: Yes    Partners: Male    Birth control/protection: Post-menopausal  Other Topics Concern   Not on file  Social History Narrative   She is originally from [place]. She has traveled to [facility], [place], [place], [place], [place], [place], [place], [place], [place]. No international travel. She has dogs. No prior bird, mold, or recent hot tub exposure. She hasn't used her hot tub in 1.5 years. 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 Previously enjoyed gardening and playing tennis. Helps to care for her mother.   Social Determinants of Health   Financial Resource Strain: Low Risk  (09/07/2021)   Overall Financial Resource Strain (CARDIA)    Difficulty of Paying Living Expenses: Not hard at all   Food Insecurity: No Food Insecurity (09/07/2021)   Hunger Vital Sign    Worried About Running Out of Food in the Last Year: Never true    Ran Out of Food in the Last Year: Never true  Transportation Needs: No Transportation Needs (09/07/2021)   PRAPARE - Hydrologist (Medical): No    Lack of Transportation (Non-Medical): No  Physical Activity: Insufficiently Active (09/07/2021)   Exercise Vital Sign    Days of Exercise per Week: 2 days  Minutes of Exercise per Session: 30 min  Stress: No Stress Concern Present (09/07/2021)   [place]    Feeling of Stress : Only a little  Social Connections: Unknown (09/07/2021)   Social Connection and Isolation Panel [NHANES]    Frequency of Communication with Friends and Family: More than three times a week    Frequency of Social Gatherings with Friends and Family: Three times a week    Attends Religious Services: 1 to 4 times per year    Active Member of Clubs or Organizations: Not on file    Attends Archivist Meetings: Not on file    Marital Status: Divorced   Allergies  Allergen Reactions   Abilify [Aripiprazole] Nausea And Vomiting   Amoxicillin Diarrhea   Promethazine Hcl    Seroquel [Quetiapine Fumarate]     Unknown    Medications   (Not in a hospital admission)    No current facility-administered medications for this encounter.  Current Outpatient Medications:    alendronate (FOSAMAX) 70 MG tablet, Take 1 tablet (70 mg total) by mouth every 7 (seven) days. Take with a full glass of water on an empty stomach., Disp: 4 tablet, Rfl: 11   amLODipine (NORVASC) 5 MG tablet, Take 1 tablet (5 mg total) by mouth daily., Disp: 90 tablet, Rfl: 1   Ascorbic Acid (VITAMIN C) 1000 MG tablet, Take 1,000 mg by mouth daily., Disp: , Rfl:    aspirin 81 MG chewable tablet, Chew 1 tablet (81 mg total) by mouth daily., Disp: 30 tablet, Rfl: 3    atorvastatin (LIPITOR) 80 MG tablet, Take 1 tablet (80 mg total) by mouth daily., Disp: 90 tablet, Rfl: 1   B Complex Vitamins (B COMPLEX PO), Take 1 tablet by mouth daily., Disp: , Rfl:    Budeson-Glycopyrrol-Formoterol (BREZTRI AEROSPHERE) 160-9-4.8 MCG/ACT AERO, Inhale 2 puffs into the lungs in the morning and at bedtime. (Patient taking differently: Inhale 2 puffs into the lungs 2 (two) times daily as needed (asthma).), Disp: 5.9 g, Rfl: 1   Cholecalciferol (VITAMIN D) 50 MCG (2000 UT) tablet, Take 2,000 Units by mouth daily., Disp: , Rfl:    clonazePAM (KLONOPIN) 0.5 MG tablet, Take 1 tablet (0.5 mg total) by mouth daily., Disp: 90 tablet, Rfl: 1   ezetimibe (ZETIA) 10 MG tablet, Take 1 tablet (10 mg total) by mouth daily., Disp: 90 tablet, Rfl: 3   levothyroxine (SYNTHROID) 125 MCG tablet, Take 1 tablet (125 mcg total) by mouth daily. On an empty stomach., Disp: 90 tablet, Rfl: 3   PARoxetine (PAXIL) 40 MG tablet, Take 1 tablet (40 mg total) by mouth daily., Disp: 90 tablet, Rfl: 1   rizatriptan (MAXALT) 5 MG tablet, Take 1 tablet (5 mg total) by mouth daily as needed for migraine., Disp: 10 tablet, Rfl: 5   triamcinolone cream (KENALOG) 0.5 %, Apply 1 Application topically 4 (four) times daily as needed (skin irritation)., Disp: , Rfl:   Vitals   Vitals:   01/04/22 1024 01/04/22 1025 01/04/22 1030 01/04/22 1100  BP:    (!) 139/90  Pulse:  63 65 64  Resp:  13 17 18   SpO2:  100% 100% 97%  Weight: 79.4 kg        Body mass index is 28.68 kg/m.  Physical Exam   Physical Exam Gen: A&O x4, NAD HEENT: Atraumatic, normocephalic;mucous membranes moist; oropharynx clear, tongue without atrophy or fasciculations. Neck: Supple, trachea midline. Resp: CTAB, no w/r/r CV:  RRR, no m/g/r; nml S1 and S2. 2+ symmetric peripheral pulses. Abd: soft/NT/ND; nabs x 4 quad Extrem: Nml bulk; no cyanosis, clubbing, or edema.  Neuro: *MS: A&O x4. Follows multi-step commands.  *Speech: fluid,  nondysarthric, able to name and repeat *CN:    I: Deferred   II,III: PERRLA, VFF by confrontation, optic discs unable to be visualized 2/2 pupillary constriction   III,IV,VI: EOMI w/o nystagmus, no ptosis   V: Sensation intact from V1 to V3 to LT   VII: Eyelid closure was full.  L UMN facial droop   VIII: Hearing intact to voice   IX,X: Voice normal, palate elevates symmetrically    XI: SCM/trap 5/5 bilat   XII: Tongue protrudes midline, no atrophy or fasciculations   *Motor:   Normal bulk.  No tremor, rigidity or bradykinesia. No pronator drift. LUE drift but not to bed. LLE drift to bed. RUE and RLE full strength, *Sensory: L sided sensory deficit. No double-simultaneous extinction.  *Coordination:  Finger-to-nose, heel-to-shin, rapid alternating motions were intact. *Reflexes:  2+ and symmetric throughout without clonus; toes down-going bilat *Gait: normal base, normal stride, normal turn. Negative Romberg.  NIHSS  1a Level of Conscious.: 0 1b LOC Questions: 0 1c LOC Commands: 0 2 Best Gaze: 0 3 Visual: 0 4 Facial Palsy: 1 5a Motor Arm - left: 1 5b Motor Arm - Right: 0 6a Motor Leg - Left: 2 6b Motor Leg - Right: 0 7 Limb Ataxia: 0 8 Sensory: 1 9 Best Language: 0 10 Dysarthria: 0 11 Extinct. and Inatten.: 0  TOTAL: 5   Premorbid mRS = 0   Labs   CBC:  Recent Labs  Lab 01/04/22 0947 01/04/22 0958  WBC 5.0  --   NEUTROABS 2.6  --   HGB 14.7 14.6  HCT 42.6 43.0  MCV 90.8  --   PLT 207  --     Basic Metabolic Panel:  Lab Results  Component Value Date   NA 141 01/04/2022   K 3.8 01/04/2022   CO2 25 01/04/2022   GLUCOSE 107 (H) 01/04/2022   BUN 11 01/04/2022   CREATININE 0.70 01/04/2022   CALCIUM 9.1 01/04/2022   GFRNONAA >60 01/04/2022   GFRAA >60 10/19/2019   Lipid Panel:  Lab Results  Component Value Date   LDLCALC 156 (H) 12/05/2020   HgbA1c:  Lab Results  Component Value Date   HGBA1C 5.4 12/05/2020   Urine Drug Screen:     Component  Value Date/Time   LABOPIA NONE DETECTED 01/10/2021 1104   COCAINSCRNUR NONE DETECTED 01/10/2021 1104   LABBENZ NONE DETECTED 01/10/2021 1104   AMPHETMU NONE DETECTED 01/10/2021 1104   THCU NONE DETECTED 01/10/2021 1104   LABBARB NONE DETECTED 01/10/2021 1104    Alcohol Level     Component Value Date/Time   ETH <10 01/04/2022 0947     Impression   61 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 She is outside the window for TNK. Stroke scale 5 for weakness L face/arm/leg and sensory deficit, CT head NAICP. TNK not administered 2/2 patient outside the window. CTA showed severe stenosis distal to R P2 stent with slow flow there on CTP but no frank ischemia. Per patient's request I called her neurosurgeon Dr. Kathyrn Sheriff who did not recommend any acute intervention on this chronic lesion. Patient is in agreement.  Recommendations   - Admit for stroke workup - Permissive HTN x48 hrs  from sx onset or until stroke ruled out by MRI goal BP <220/110. PRN labetalol or hydralazine if BP above these parameters. Avoid oral antihypertensives. - MRI brain wo contrast - TTE w/ bubble - Check A1c and LDL + add statin per guidelines - Continue aspirin 59m daily - Ticagrelor 924mbid. No plavix - q4 hr neuro checks - STAT head CT for any change in neuro exam - Tele - PT/OT/SLP - Stroke education - Amb referral to neurology upon discharge  - Will continue to follow ______________________________________________________________________   Thank you for the opportunity to take part in the care of this patient. If you have any further questions, please contact the neurology consultation attending.  Signed,  CoSu MonksMD [facility] [phone] 7pm- 7am, please page neurology on call as listed in [facility] **Any copied and pasted documentation in this note was written by me in another  application not billed for and pasted by me into this document.

## 2022-01-04 NOTE — ED Notes (Signed)
Neuro at [facility], pt updated.

## 2022-01-04 NOTE — ED Provider Notes (Signed)
[place] EMERGENCY DEPARTMENT Provider Note   CSN: [id number] Arrival date & time: 01/04/22  8527     History {Add pertinent medical, surgical, social history, OB history to HPI:1} Chief complaint: Headache, weakness  Tiffany Horn is a 61 y.o. female.  HPI   Patient has a history of prior migraines, cerebral aneurysm, stroke status post neurovascular intervention.  Patient states she has been having some trouble with recurrent headaches in the back of her head.  She has followed up with her neurosurgeon.  Patient states that last night she had a recurrent headache.  She woke up this morning and was able to go to work.  However she then developed acute weakness on the left side.  She also feels dizzy and has her persistent headache.  Patient presented as a code stroke  Home Medications Prior to Admission medications   Medication Sig Start Date End Date Taking? Authorizing Provider  alendronate (FOSAMAX) 70 MG tablet Take 1 tablet (70 mg total) by mouth every 7 (seven) days. Take with a full glass of water on an empty stomach. 09/13/21   Kuneff, Renee A, DO  amLODipine (NORVASC) 5 MG tablet Take 1 tablet (5 mg total) by mouth daily. 09/13/21   Kuneff, Renee A, DO  Ascorbic Acid (VITAMIN C) 1000 MG tablet Take 1,000 mg by mouth daily.    [provider]  aspirin 81 MG chewable tablet Chew 1 tablet (81 mg total) by mouth daily. 11/22/20   Consuella Lose, MD  atorvastatin (LIPITOR) 80 MG tablet Take 1 tablet (80 mg total) by mouth daily. 09/13/21   Kuneff, Renee A, DO  B Complex Vitamins (B COMPLEX PO) Take 1 tablet by mouth daily.    [provider]  Budeson-Glycopyrrol-Formoterol (BREZTRI AEROSPHERE) 160-9-4.8 MCG/ACT AERO Inhale 2 puffs into the lungs in the morning and at bedtime. Patient taking differently: Inhale 2 puffs into the lungs 2 (two) times daily as needed (asthma). 04/28/20   Garner Nash, DO  Cholecalciferol (VITAMIN D) 50 MCG  (2000 UT) tablet Take 2,000 Units by mouth daily.    [provider]  clonazePAM (KLONOPIN) 0.5 MG tablet Take 1 tablet (0.5 mg total) by mouth daily. 09/13/21   Kuneff, Renee A, DO  ezetimibe (ZETIA) 10 MG tablet Take 1 tablet (10 mg total) by mouth daily. 09/16/21   Kuneff, Renee A, DO  levothyroxine (SYNTHROID) 125 MCG tablet Take 1 tablet (125 mcg total) by mouth daily. On an empty stomach. 09/14/21   Kuneff, Renee A, DO  PARoxetine (PAXIL) 40 MG tablet Take 1 tablet (40 mg total) by mouth daily. 09/13/21   Kuneff, Renee A, DO  rizatriptan (MAXALT) 5 MG tablet Take 1 tablet (5 mg total) by mouth daily as needed for migraine. 09/13/21   Kuneff, Renee A, DO  triamcinolone cream (KENALOG) 0.5 % Apply 1 Application topically 4 (four) times daily as needed (skin irritation). 07/10/21   [provider]      Allergies    Abilify [aripiprazole], Amoxicillin, Promethazine hcl, and Seroquel [quetiapine fumarate]    Review of Systems   Review of Systems  Physical Exam Updated Vital Signs BP (!) 159/92   Pulse 65   Resp 17   Wt 79.4 kg   LMP 01/31/2007   SpO2 100%   BMI 28.68 kg/m  Physical Exam Vitals and nursing note reviewed.  Constitutional:      Appearance: She is well-developed. She is not diaphoretic.  HENT:  Head: Normocephalic and atraumatic.     Right Ear: External ear normal.     Left Ear: External ear normal.  Eyes:     General: No scleral icterus.       Right eye: No discharge.        Left eye: No discharge.     Conjunctiva/sclera: Conjunctivae normal.  Neck:     Trachea: No tracheal deviation.  Cardiovascular:     Rate and Rhythm: Normal rate and regular rhythm.  Pulmonary:     Effort: Pulmonary effort is normal. No respiratory distress.     Breath sounds: Normal breath sounds. No stridor. No wheezing or rales.  Abdominal:     General: Bowel sounds are normal. There is no distension.     Palpations: Abdomen is soft.     Tenderness: There is no  abdominal tenderness. There is no guarding or rebound.  Musculoskeletal:        General: No tenderness or deformity.     Cervical back: Neck supple.  Skin:    General: Skin is warm and dry.     Findings: No rash.  Neurological:     Mental Status: She is alert.     Cranial Nerves: No dysarthria or facial asymmetry.     Sensory: No sensory deficit.     Motor: Weakness present. No abnormal muscle tone or seizure activity.     Coordination: Coordination normal.     Comments: Decreased left grip strength, more difficulty lifting left leg off the bed  Psychiatric:        Mood and Affect: Mood normal.     ED Results / Procedures / Treatments   Labs (all labs ordered are listed, but only abnormal results are displayed) Labs Reviewed  I-STAT CHEM 8, ED - Abnormal; Notable for the following components:      Result Value   Glucose, Bld 107 (*)    All other components within normal limits  CBG MONITORING, ED - Abnormal; Notable for the following components:   Glucose-Capillary 106 (*)    All other components within normal limits  CBC  DIFFERENTIAL  PROTIME-INR  APTT  COMPREHENSIVE METABOLIC PANEL  ETHANOL  I-STAT BETA HCG BLOOD, ED ([facility], [facility], [facility] ONLY)    EKG EKG Interpretation  Date/Time:  Wednesday January 04 2022 10:18:33 EST Ventricular Rate:  68 PR Interval:  143 QRS Duration: 85 QT Interval:  407 QTC Calculation: 433 R Axis:   46 Text Interpretation: Sinus rhythm Low voltage, precordial leads No significant change since last tracing Confirmed by Dorie Rank [phone]) on 01/04/2022 10:33:59 AM  Radiology CT HEAD CODE STROKE WO CONTRAST  Result Date: 01/04/2022 CLINICAL DATA:  Code stroke. 61 year old female left arm weakness. History of endovascular treated basilar tip aneurysm. EXAM: CT HEAD WITHOUT CONTRAST TECHNIQUE: Contiguous axial images were obtained from the base of the skull through the vertex without intravenous contrast. RADIATION DOSE REDUCTION: This exam was  performed according to the departmental dose-optimization program which includes automated exposure control, adjustment of the mA and/or kV according to patient size and/or use of iterative reconstruction technique. COMPARISON:  CTA head and neck 07/25/2021. brain MRI 05/07/2021. FINDINGS: Brain: Basilar tip aneurysm coil pack with stable streak artifact. Stable gray-white matter differentiation throughout the brain. No midline shift, ventriculomegaly, mass effect, evidence of mass lesion, intracranial hemorrhage or evidence of cortically based acute infarction. No convincing encephalomalacia. Vascular: Sequelae of stent assisted basilar tip aneurysm coil embolization. Subsequent streak artifact mostly obscures the carotid termini  and proximal MCAs. Skull: No acute osseous abnormality identified. Sinuses/Orbits: Visualized paranasal sinuses and mastoids are stable and well aerated. Other: Visualized orbits and scalp soft tissues are within normal limits. ASPECTS [facility] Stroke Program Early CT Score) Total score (0-10 with 10 being normal): 10 IMPRESSION: 1. Stable non contrast CT appearance of the brain, negative except for sequelae of stent assisted basilar tip aneurysm embolization. ASPECTS 10. 2. These results were communicated to Dr. Quinn Axe at 10:05 am on 01/04/2022 by text page via the [facility] messaging system. Electronically Signed   By: Genevie Ann M.D.   On: 01/04/2022 10:06    Procedures Procedures  {Document cardiac monitor, telemetry assessment procedure when appropriate:1}  Medications Ordered in ED Medications  sodium chloride flush (NS) 0.9 % injection 3 mL (3 mLs Intravenous Given 01/04/22 1027)  iohexol (OMNIPAQUE) 350 MG/ML injection 100 mL (100 mLs Intravenous Contrast Given 01/04/22 1012)  prochlorperazine (COMPAZINE) injection 10 mg (10 mg Intravenous Given 01/04/22 1036)    ED Course/ Medical Decision Making/ A&P Clinical Course as of 01/04/22 1050  Wed Jan 04, 2022  1033 CBC CBC normal  [JK]  1033 I-stat chem 8, ED(!) I-STAT without acute abnormalities [JK]  1033 CT HEAD CODE STROKE WO CONTRAST Head CT does not show any acute abnormalities [JK]    Clinical Course User Index [JK] Dorie Rank, MD                           Medical Decision Making Differential diagnosis includes but not limited to cerebral hemorrhage, acute stroke, complex migraine  Problems Addressed: Acute nonintractable headache, unspecified headache type: acute illness or injury Weakness: acute illness or injury  Amount and/or Complexity of Data Reviewed Labs: ordered. Decision-making details documented in ED Course. Radiology: ordered and independent interpretation performed. Discussion of management or test interpretation with external provider(s): Case reviewed with Dr. Quinn Axe neurology.  Plan is to admit to the hospital for stroke workup.  No plans for acute intervention at this time.  Risk Prescription drug management. Decision regarding hospitalization.   Patient presented to the ED for evaluation of acute headache and weakness.  She has history of CVA and cerebral aneurysm status post intervention.  Initial head CT fortunately does not show any signs of hemorrhage.  Her CT angiogram does show some areas of stenosis but not felt to be contributing to her symptoms today.  Not a candidate for tPA or neurovascular intervention at this time.  Patient has been seen by neurology Dr. Quinn Axe.  Plan is for admission to the hospital for further stroke workup.  Compazine ordered for headache  {Document critical care time when appropriate:1} {Document review of labs and clinical decision tools ie heart score, Chads2Vasc2 etc:1}  {Document your independent review of radiology images, and any outside records:1} {Document your discussion with family members, caretakers, and with consultants:1} {Document social determinants of health affecting pt's care:1} {Document your decision making why or why not  admission, treatments were needed:1} Final Clinical Impression(s) / ED Diagnoses Final diagnoses:  Acute nonintractable headache, unspecified headache type  Weakness    Rx / DC Orders ED Discharge Orders     None

## 2022-01-04 NOTE — ED Notes (Signed)
Registration at [facility]. Pt called and updated bf/ so.

## 2022-01-04 NOTE — ED Notes (Signed)
Pt to MRI

## 2022-01-04 NOTE — ED Notes (Signed)
Up to b/r, steady gait

## 2022-01-04 NOTE — Progress Notes (Signed)
  Echocardiogram 2D Echocardiogram has been performed.  Tiffany Horn 01/04/2022, 4:49 PM

## 2022-01-04 NOTE — ED Notes (Addendum)
Back to exam room 25 from CT. Alert, NAD, calm, interactive, stroke team at [facility]. EDP present.  No changes. C/o continued HA, nausea and dizziness. Initial NIH = 5.

## 2022-01-04 NOTE — Code Documentation (Signed)
Tiffany Horn is a 61 yr old female arriving to [facility] via EMS on 01/04/2022 with a PMH of stroke, basilar aneurysm s/p coiling, and migraines.  Pt is from home where she has had h/a and dizziness off and on for some time. She felt ok enough to go to work, where she began having left sided numbness and weakness at 0900.    Stroke team at bedside for pt's arrival. Labs, CBG drawn, airway cleared by EDP. Pt to CT with team. NIH 5 for left sided weakness and sensory loss and facial palsy. Please see documentation for times and NIHSS details. The following imaging was obtained: NCCT, CTA/P. Per Dr Quinn Axe, NCCT neg for acute hemmorhage. Per Dr Quinn Axe, CTA neg for LVO, no ischemic core or perfusion deficit present.    Pt returned to ED room 25 where her workup will continue. She will need q 2 hr VS and NIHSS. Bedside handoff with Su Grand. Pt not eligible for thrombolytic as LKW OOW and history cerebral aneurysm. No NIR as LVO negative.

## 2022-01-04 NOTE — ED Notes (Signed)
Called to activate the purple man

## 2022-01-05 ENCOUNTER — Other Ambulatory Visit (HOSPITAL_COMMUNITY): Payer: BC Managed Care – PPO

## 2022-01-05 ENCOUNTER — Other Ambulatory Visit (HOSPITAL_COMMUNITY): Payer: Self-pay

## 2022-01-05 ENCOUNTER — Ambulatory Visit: Payer: BC Managed Care – PPO | Admitting: Physician Assistant

## 2022-01-05 DIAGNOSIS — G459 Transient cerebral ischemic attack, unspecified: Secondary | ICD-10-CM

## 2022-01-05 DIAGNOSIS — R519 Headache, unspecified: Secondary | ICD-10-CM

## 2022-01-05 LAB — LIPID PANEL
Cholesterol: 241 mg/dL — ABNORMAL HIGH (ref 0–200)
HDL: 52 mg/dL (ref >40)
LDL Cholesterol: 158 mg/dL — ABNORMAL HIGH (ref 0–99)
Total CHOL/HDL Ratio: 4.6 RATIO
Triglycerides: 156 mg/dL — ABNORMAL HIGH (ref <150)
VLDL: 31 mg/dL (ref 0–40)

## 2022-01-05 LAB — HEMOGLOBIN A1C
Hgb A1c MFr Bld: 5.7 % — ABNORMAL HIGH (ref 4.8–5.6)
Mean Plasma Glucose: 117 mg/dL

## 2022-01-05 LAB — HIV ANTIBODY (ROUTINE TESTING W REFLEX): HIV Screen 4th Generation wRfx: NONREACTIVE

## 2022-01-05 MED ORDER — CLOPIDOGREL BISULFATE 75 MG PO TABS: 75.0000 mg | ORAL_TABLET | Freq: Every day | ORAL | 1 refills | Status: DC

## 2022-01-05 MED ORDER — ASPIRIN 81 MG PO CHEW: 81.0000 mg | CHEWABLE_TABLET | Freq: Every day | ORAL | 0 refills | Status: AC

## 2022-01-05 MED ORDER — CLOPIDOGREL BISULFATE 75 MG PO TABS: 75.0000 mg | ORAL_TABLET | Freq: Every day | ORAL | Status: DC

## 2022-01-05 MED ORDER — CLOPIDOGREL BISULFATE 75 MG PO TABS
300.0000 mg | ORAL_TABLET | Freq: Once | ORAL | Status: AC
  Administered 2022-01-05: 300 mg via ORAL
  Filled 2022-01-05: qty 4

## 2022-01-05 NOTE — Discharge Instructions (Signed)
Please take aspirin and Plavix for 21 days, then discontinue aspirin and continue with Plavix alone at neurologist recommendation  Follow with Raoul Pitch, Renee A, DO in 5-7 days Follow up with Dr Kathyrn Sheriff as scheduled  Please get a complete blood count and chemistry panel checked by your Primary MD at your next visit, and again as instructed by your Primary MD. Please get your medications reviewed and adjusted by your Primary MD.  Please request your Primary MD to go over all Hospital Tests and Procedure/Radiological results at the follow up, please get all Hospital records sent to your Prim MD by signing hospital release before you go home.  In some cases, there will be blood work, cultures and biopsy results pending at the time of your discharge. Please request that your primary care M.D. goes through all the records of your hospital data and follows up on these results.  If you had Pneumonia of Lung problems at the Hospital: Please get a 2 view Chest X ray done in 6-8 weeks after hospital discharge or sooner if instructed by your Primary MD.  If you have Congestive Heart Failure: Please call your Cardiologist or Primary MD anytime you have any of the following symptoms:  1) 3 pound weight gain in 24 hours or 5 pounds in 1 week  2) shortness of breath, with or without a dry hacking cough  3) swelling in the hands, feet or stomach  4) if you have to sleep on extra pillows at night in order to breathe  Follow cardiac low salt diet and 1.5 lit/day fluid restriction.  If you have diabetes Accuchecks 4 times/day, Once in AM empty stomach and then before each meal. Log in all results and show them to your primary doctor at your next visit. If any glucose reading is under 80 or above 300 call your primary MD immediately.  If you have Seizure/Convulsions/Epilepsy: Please do not drive, operate heavy machinery, participate in activities at heights or participate in high speed sports until you have  seen by Primary MD or a Neurologist and advised to do so again. Per [facility] statutes, patients with seizures are not allowed to drive until they have been seizure-free for six months.  Use caution when using heavy equipment or power tools. Avoid working on ladders or at heights. Take showers instead of baths. Ensure the water temperature is not too high on the home water heater. Do not go swimming alone. Do not lock yourself in a room alone (i.e. bathroom). When caring for infants or small children, sit down when holding, feeding, or changing them to minimize risk of injury to the child in the event you have a seizure. Maintain good sleep hygiene. Avoid alcohol.   If you had Gastrointestinal Bleeding: Please ask your Primary MD to check a complete blood count within one week of discharge or at your next visit. Your endoscopic/colonoscopic biopsies that are pending at the time of discharge, will also need to followed by your Primary MD.  Get Medicines reviewed and adjusted. Please take all your medications with you for your next visit with your Primary MD  Please request your Primary MD to go over all hospital tests and procedure/radiological results at the follow up, please ask your Primary MD to get all Hospital records sent to his/her office.  If you experience worsening of your admission symptoms, develop shortness of breath, life threatening emergency, suicidal or homicidal thoughts you must seek medical attention immediately by calling 911 or calling  your MD immediately  if symptoms less severe.  You must read complete instructions/literature along with all the possible adverse reactions/side effects for all the Medicines you take and that have been prescribed to you. Take any new Medicines after you have completely understood and accpet all the possible adverse reactions/side effects.   Do not drive or operate heavy machinery when taking Pain medications.   Do not take more than  prescribed Pain, Sleep and Anxiety Medications  Special Instructions: If you have smoked or chewed Tobacco  in the last 2 yrs please stop smoking, stop any regular Alcohol  and or any Recreational drug use.  Wear Seat belts while driving.  Please note You were cared for by a hospitalist during your hospital stay. If you have any questions about your discharge medications or the care you received while you were in the hospital after you are discharged, you can call the unit and asked to speak with the hospitalist on call if the hospitalist that took care of you is not available. Once you are discharged, your primary care physician will handle any further medical issues. Please note that NO REFILLS for any discharge medications will be authorized once you are discharged, as it is imperative that you return to your primary care physician (or establish a relationship with a primary care physician if you do not have one) for your aftercare needs so that they can reassess your need for medications and monitor your lab values.  You can reach the hospitalist office at phone [phone] or fax [phone]   If you do not have a primary care physician, you can call [phone] for a physician referral.  Activity: As tolerated with Full fall precautions use walker/cane & assistance as needed    Diet: heart healthy  Disposition Home

## 2022-01-05 NOTE — Progress Notes (Addendum)
STROKE TEAM PROGRESS NOTE   INTERVAL HISTORY No family is at bedside.  Neuro exam stable, no deficits seen. Discussed case with Dr. Kathyrn Sheriff, patient sees him outpatient currently. Cerebral angiogram recommended. This procedure was originally scheduled for 12/15. Patient and Dr. Kathyrn Sheriff agreeable with keeping this appointment for the procedure.  Patient is safe and appropriate for discharge, with this follow-up procedure and following up with neurology.  MRI scan shows no acute infarct.  CT angiogram shows no large vessel stenosis or occlusion.  Marked to severe stenosis of the right PCA P2 segment just distal to the stent has progressed slightly compared with CT angio on 07/25/2021.  Stable appearance of stent assisted coil embolization of basilar tip aneurysm. Vitals:   01/05/22 0301 01/05/22 0720 01/05/22 1123 01/05/22 1256  BP: (!) 141/68 (!) 130/93 133/89   Pulse: 72 81 80   Resp: 16 13 15    Temp: 97.9 F (36.6 C) 98.2 F (36.8 C) 97.8 F (36.6 C)   TempSrc: Oral Oral Oral   SpO2: 99% 98% 99%   Weight:      Height:    5' 6"  (1.676 m)   CBC:  Recent Labs  Lab 01/04/22 0947 01/04/22 0958  WBC 5.0  --   NEUTROABS 2.6  --   HGB 14.7 14.6  HCT 42.6 43.0  MCV 90.8  --   PLT 207  --    Basic Metabolic Panel:  Recent Labs  Lab 01/04/22 0947 01/04/22 0958  NA 140 141  K 3.7 3.8  CL 108 106  CO2 25  --   GLUCOSE 107* 107*  BUN 11 11  CREATININE 0.76 0.70  CALCIUM 9.1  --    Lipid Panel:  Recent Labs  Lab 01/05/22 0415  CHOL 241*  TRIG 156*  HDL 52  CHOLHDL 4.6  VLDL 31  LDLCALC 158*   HgbA1c: No results for input(s): "HGBA1C" in the last 168 hours. Urine Drug Screen: No results for input(s): "LABOPIA", "COCAINSCRNUR", "LABBENZ", "AMPHETMU", "THCU", "LABBARB" in the last 168 hours.  Alcohol Level  Recent Labs  Lab 01/04/22 0947  ETH <10    IMAGING past 24 hours ECHOCARDIOGRAM COMPLETE  Result Date: 01/04/2022    ECHOCARDIOGRAM REPORT   Patient Name:    Tiffany Horn Date of Exam: 01/04/2022 Medical Rec #:  [id number]        Height:       65.5 in Accession #:    [id number]       Weight:       175.0 lb Date of Birth:  July 19, 1960        BSA:          1.880 m Patient Age:    61 years         BP:           145/120 mmHg Patient Gender: F                HR:           64 bpm. Exam Location:  Inpatient Procedure: 2D Echo, Color Doppler and Cardiac Doppler Indications:    TIA  History:        Patient has prior history of Echocardiogram examinations, most                 recent 12/05/2020. Risk Factors:Hypertension and Dyslipidemia.  Sonographer:    Johny Chess RDCS Referring Phys: [place]  1. Left ventricular ejection fraction, by estimation, is 55  to 60%. The left ventricle has normal function. The left ventricle has no regional wall motion abnormalities. Left ventricular diastolic parameters are consistent with Grade I diastolic dysfunction (impaired relaxation).  2. Right ventricular systolic function is normal. The right ventricular size is normal. Tricuspid regurgitation signal is inadequate for assessing PA pressure.  3. The mitral valve is normal in structure. No evidence of mitral valve regurgitation. No evidence of mitral stenosis.  4. The aortic valve is tricuspid. Aortic valve regurgitation is not visualized. No aortic stenosis is present.  5. The inferior vena cava is normal in size with greater than 50% respiratory variability, suggesting right atrial pressure of 3 mmHg. FINDINGS  Left Ventricle: Left ventricular ejection fraction, by estimation, is 55 to 60%. The left ventricle has normal function. The left ventricle has no regional wall motion abnormalities. The left ventricular internal cavity size was normal in size. There is  no left ventricular hypertrophy. Left ventricular diastolic parameters are consistent with Grade I diastolic dysfunction (impaired relaxation). Right Ventricle: The right ventricular size is normal. No  increase in right ventricular wall thickness. Right ventricular systolic function is normal. Tricuspid regurgitation signal is inadequate for assessing PA pressure. Left Atrium: Left atrial size was normal in size. Right Atrium: Right atrial size was normal in size. Pericardium: There is no evidence of pericardial effusion. Mitral Valve: The mitral valve is normal in structure. No evidence of mitral valve regurgitation. No evidence of mitral valve stenosis. Tricuspid Valve: The tricuspid valve is normal in structure. Tricuspid valve regurgitation is not demonstrated. Aortic Valve: The aortic valve is tricuspid. Aortic valve regurgitation is not visualized. No aortic stenosis is present. Pulmonic Valve: The pulmonic valve was normal in structure. Pulmonic valve regurgitation is not visualized. Aorta: The aortic root is normal in size and structure. Venous: The inferior vena cava is normal in size with greater than 50% respiratory variability, suggesting right atrial pressure of 3 mmHg. IAS/Shunts: No atrial level shunt detected by color flow Doppler.  LEFT VENTRICLE PLAX 2D LVIDd:         4.20 cm   Diastology LVIDs:         2.80 cm   LV e' medial:    10.10 cm/s LV PW:         0.90 cm   LV E/e' medial:  7.8 LV IVS:        1.00 cm   LV e' lateral:   13.50 cm/s LVOT diam:     1.70 cm   LV E/e' lateral: 5.8 LV SV:         46 LV SV Index:   24 LVOT Area:     2.27 cm  RIGHT VENTRICLE             IVC RV Basal diam:  2.80 cm     IVC diam: 1.20 cm RV S prime:     16.20 cm/s TAPSE (M-mode): 2.3 cm LEFT ATRIUM             Index        RIGHT ATRIUM           Index LA diam:        3.20 cm 1.70 cm/m   RA Area:     10.50 cm LA Vol (A2C):   24.3 ml 12.93 ml/m  RA Volume:   23.40 ml  12.45 ml/m LA Vol (A4C):   28.8 ml 15.32 ml/m LA Biplane Vol: 26.5 ml 14.10 ml/m  AORTIC VALVE LVOT Vmax:  95.30 cm/s LVOT Vmean:  59.000 cm/s LVOT VTI:    0.201 m  AORTA Ao Root diam: 3.20 cm Ao Asc diam:  3.30 cm MITRAL VALVE MV Area (PHT):  3.65 cm    SHUNTS MV Decel Time: 208 msec    Systemic VTI:  0.20 m MV E velocity: 78.60 cm/s  Systemic Diam: 1.70 cm MV A velocity: 77.90 cm/s MV E/A ratio:  1.01 Dalton McleanMD Electronically signed by Franki Monte Signature Date/Time: 01/04/2022/5:36:25 PM    Final    MR BRAIN WO CONTRAST  Result Date: 01/04/2022 CLINICAL DATA:  Headache.  Dizziness.  Nausea. EXAM: MRI HEAD WITHOUT CONTRAST TECHNIQUE: Multiplanar, multiecho pulse sequences of the brain and surrounding structures were obtained without intravenous contrast. COMPARISON:  Same-day CT brain and angiogram. FINDINGS: Brain: No acute infarction, hydrocephalus, extra-axial collection or mass lesion. There is a small punctate focus of microhemorrhage in the posterior right temporal lobe (series 7, image 55). 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 Otherwise normal flow voids. Skull and upper cervical spine: Normal marrow signal. Sinuses/Orbits: Negative. Other: None IMPRESSION: 1. No acute intracranial abnormality. 2. 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 Electronically Signed   By: Marin Roberts M.D.   On: 01/04/2022 17:06    PHYSICAL EXAM  Temp:  [97.6 F (36.4 C)-98.2 F (36.8 C)] 97.8 F (36.6 C) (12/07 1123) Pulse Rate:  [64-81] 80 (12/07 1123) Resp:  [12-18] 15 (12/07 1123) BP: (123-153)/(68-120) 133/89 (12/07 1123) SpO2:  [96 %-100 %] 99 % (12/07 1123)  General - Well nourished, well developed pleasant middle-aged Caucasian lady, in no apparent distress.  Mental Status -  Level of arousal and orientation to time, place, and person were intact. Language including expression, naming, repetition, comprehension was assessed and found intact. Attention span and concentration were normal. Recent and remote memory were intact. Fund of Knowledge was assessed and was intact.  Cranial Nerves II - XII - II - Visual field intact OU. III, IV, VI -  Extraocular movements intact. V - Facial sensation intact bilaterally. VII - Facial movement intact bilaterally. VIII - Hearing & vestibular intact bilaterally. X - Palate elevates symmetrically. XI - Chin turning & shoulder shrug intact bilaterally. XII - Tongue protrusion intact.  Motor Strength - The patient's strength was normal in all extremities and pronator drift was absent.  Bulk was normal and fasciculations were absent.   Motor Tone - Muscle tone was assessed at the neck and appendages and was normal.  Sensory - Light touch, temperature/pinprick were assessed and were symmetrical.    Coordination - The patient had normal movements in the hands and feet with no ataxia or dysmetria.  Tremor was absent.  Gait and Station - deferred.  ASSESSMENT/PLAN Tiffany Horn is a 61 y.o. female with history of HTN, HLD, CVA from aneurysm, s/p stent in 2022, hypothyroidism, UC presenting with hx coiled basilar aneurysm who presents with headache, L sided weakness and sensory deficits. TNK not administered 2/2 patient outside the window. CTA showed severe stenosis distal to R P2 stent with slow flow there on CTP but no frank ischemia. .  Neuro exam stable, no deficits seen. Discussed case with Dr. Kathyrn Sheriff, patient sees him outpatient currently. Cerebral angiogram recommended. This procedure was originally scheduled for 12/15. Patient and Dr. Kathyrn Sheriff agreeable with keeping this appointment for the procedure.  Patient is safe and appropriate for discharge, with this follow-up procedure and following up with neurology.   Posterior circulation TIA:  Etiology: Likely small vessel disease versus small clot and required with basilar artery aneurysm.  Doubt this is related to d/t progressive right PCA severe stenosis   Code Stroke CT head: 1. Stable non contrast CT appearance of the brain, negative except for sequelae of stent assisted basilar tip aneurysm embolization. ASPECTS 10. No acute  abnormality.   CTA head & neck  CTA neck:   1. The common carotid and internal carotid arteries are patent within the neck. Minimal atherosclerotic plaque about the left carotid bifurcation. 2. The vertebral arteries are patent within the neck without stenosis or significant atherosclerotic disease. 3. Aberrant right subclavian artery.   CTA head:   1. Redemonstrated sequelae of prior stent assisted coil embolization of a basilar tip aneurysm. There is prominent streak and beam hardening artifact arising from the embolization coil mass. Within this limitation, there is no visible residual or recurrent aneurysm. 2. No intracranial large vessel occlusion is identified. 3. A markedly severe stenosis within the right PCA P2 segment (just beyond the stent) has progressed from the prior CTA of 07/25/2021.  CT perfusion head:   The perfusion software identifies no core infarct. The perfusion software identifies no critically hypoperfused parenchyma (utilizing the Tmax>6 seconds threshold). However, a 16 mL region of hypoperfusion is noted within the right PCA vascular territory using the Tmax > 4 seconds threshold.  MRI   1. No acute intracranial abnormality. 2. Postprocedural changes from prior basilar artery aneurysm embolization  2D Echo: EF 55-60%  LDL 158 HgbA1c 5.4 VTE prophylaxis - lovenox    Diet   Diet Heart Room service appropriate? Yes; Fluid consistency: Thin   aspirin 81 mg daily prior to admission, now on aspirin 81 mg daily and Brilinta (ticagrelor) 90 mg bid. Will d/c Brilinta, add one dose of Plavix 349m as loading dose. Recommend ASA and Plavix x 3 weeks, then Plavix alone for life.  Therapy recommendations:  home Disposition:  home  Hypertension Home meds:  Norvasc. Currently on hold to allow for permissive hypertension. Permissive hypertension (OK if < 220/120) but gradually normalize in 5-7 days Long-term BP goal normotensive  Hyperlipidemia Home  meds:  Lipitor and Zetia, resumed in hospital LDL 158, goal < 70 Continue statin at discharge Follow-up with PCP, Neurology   Other Stroke Risk Factors Obesity, Body mass index is 28.25 kg/m., BMI >/= 30 associated with increased stroke risk, recommend weight loss, diet and exercise as appropriate  Hx stroke/TIA Family hx stroke (grandmother) Migraines  Other Active Problems Hypothyroidism Synthroid 1232m daily per home meds Anxiety/Depression Klonopin and Paxil daily per home meds   Hospital day # 0  Patient seen and examined by NP/APP with MD. MD to update note as needed.   E.Rolan BuccoDNP, AGACNP-BC [facility] Pager: [phone] MD NOTE :  I have personally obtained history,examined this patient, reviewed notes, independently viewed imaging studies, participated in medical decision making and plan of care.ROS completed by me personally and pertinent positives fully documented  I have made any additions or clarifications directly to the above note. Agree with note above.  Patient presents with transient episode of bilateral blurred vision, dizziness with gait ataxia diplopia and nausea likely post dislocation TIA and CT angiogram shows progressive right PCA stenosis distal to the stent but this is unlikely to explain her presentation.  Patient needs a diagnostic cerebral catheter angiogram to evaluate the basilar tip aneurysm coiling and PCA stent but it can be done electively particularly as she has had  multiple recurrent similar episodes in the past and all of which have been transient.  Recommend Plavix load followed by Plavix 75 mg daily for life.  Continue aspirin 81 mg for 3 weeks and discontinue.  Outpatient diagnostic cerebral catheter angiogram is scheduled with Dr. Kathyrn Sheriff for 01/13/2022.  Discussed with patient and Dr. Kathyrn Sheriff and answered questions.  Discussed with Dr.Gherghe.  Follow-up as an outpatient.  Clinic with me in 2 months greater  than 50% time during this 50-minute visit was spent on counseling and coordination of care about TIA and PCA stenosis as well as coiled basilar tip aneurysm and answering questions  Antony Contras, MD Medical Director [name] Pager: [phone] 01/05/2022 2:57 PM  To contact Stroke Continuity provider, please refer to [email or web address] After hours, contact General Neurology

## 2022-01-05 NOTE — Evaluation (Signed)
Occupational Therapy Evaluation Patient Details Name: Tiffany Horn MRN: [id number] DOB: Jul 16, 1960 Today's Date: 01/05/2022   History of Present Illness 61 y.o. 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  Scheduled for angiogram 12/7.   Clinical Impression   Patient admitted for the above diagnosis.  Currently she is scheduled for an angiogram, and is presenting at her baseline for ADL, iADL, and mobility.  OT placed a few simple goals in case she does stay overnight.  Her plan is to hopefully go home after her procedure.  No post acute OT is anticipated.       Recommendations for follow up therapy are one component of a multi-disciplinary discharge planning process, led by the attending physician.  Recommendations may be updated based on patient status, additional functional criteria and insurance authorization.   Follow Up Recommendations  No OT follow up     Assistance Recommended at Discharge PRN  Patient can return home with the following Assist for transportation    Functional Status Assessment  Patient has not had a recent decline in their functional status  Equipment Recommendations  None recommended by OT    Recommendations for Other Services       Precautions / Restrictions Precautions Precautions: None Restrictions Weight Bearing Restrictions: No      Mobility Bed Mobility Overal bed mobility: Independent                  Transfers Overall transfer level: Independent                        Balance Overall balance assessment: Independent                                         ADL either performed or assessed with clinical judgement   ADL Overall ADL's : At baseline                                             Vision Patient Visual Report: Diplopia Additional Comments: Diplopia is resolving, patient descrobes more of  a blurring     Perception     Praxis      Pertinent Vitals/Pain Pain Assessment Pain Assessment: No/denies pain     Hand Dominance Right   Extremity/Trunk Assessment Upper Extremity Assessment Upper Extremity Assessment: Overall WFL for tasks assessed   Lower Extremity Assessment Lower Extremity Assessment: Overall WFL for tasks assessed   Cervical / Trunk Assessment Cervical / Trunk Assessment: Normal   Communication Communication Communication: No difficulties   Cognition Arousal/Alertness: Awake/alert Behavior During Therapy: WFL for tasks assessed/performed Overall Cognitive Status: Within Functional Limits for tasks assessed                                       General Comments   VSS on RA    Exercises     Shoulder Instructions      Home Living Family/patient expects to be discharged to:: Private residence Living Arrangements: Alone Available Help at Discharge: Family;Available PRN/intermittently Type of Home: House Home Access: Stairs to enter Entrance Stairs-Number of Steps: 2 Entrance Stairs-Rails: None  Home Layout: Two level;Bed/bath upstairs;1/2 bath on main level     Bathroom Shower/Tub: Teacher, early years/pre: Standard Bathroom Accessibility: Yes How Accessible: Accessible via walker Home Equipment: None          Prior Functioning/Environment Prior Level of Function : Independent/Modified Independent;Working/employed;Driving             Mobility Comments: Ind ADLs Comments: Ind        OT Problem List: Pain      OT Treatment/Interventions: Self-care/ADL training;Therapeutic activities    OT Goals(Current goals can be found in the care plan section) Acute Rehab OT Goals Patient Stated Goal: Return home today OT Goal Formulation: With patient Time For Goal Achievement: 01/09/22 Potential to Achieve Goals: Good ADL Goals Pt Will Perform Lower Body Dressing: Independently;sit to/from stand Pt Will  Transfer to Toilet: Independently;ambulating;regular height toilet  OT Frequency: Min 2X/week    Co-evaluation              AM-PAC OT "6 Clicks" Daily Activity     Outcome Measure Help from another person eating meals?: None Help from another person taking care of personal grooming?: None Help from another person toileting, which includes using toliet, bedpan, or urinal?: None Help from another person bathing (including washing, rinsing, drying)?: None Help from another person to put on and taking off regular upper body clothing?: None Help from another person to put on and taking off regular lower body clothing?: None 6 Click Score: 24   End of Session Nurse Communication: Mobility status  Activity Tolerance:   Patient left: in bed;with call bell/phone within reach  OT Visit Diagnosis: Unsteadiness on feet (R26.81)                Time: 8242-3536 OT Time Calculation (min): 17 min Charges:  OT General Charges $OT Visit: 1 Visit OT Evaluation $OT Eval Moderate Complexity: 1 Mod  01/05/2022  RP, OTR/L  Acute Rehabilitation Services  Office:  [phone]   Metta Clines 01/05/2022, 9:11 AM

## 2022-01-05 NOTE — Discharge Summary (Signed)
Physician Discharge Summary  Tiffany Horn AJG:[id number] DOB: 03/20/1960 DOA: 01/04/2022  PCP: [name], DO  Admit date: 01/04/2022 Discharge date: 01/05/2022  Admitted From: home Disposition:  home  Recommendations for Outpatient Follow-up:  Follow up with PCP in 1-2 weeks Follow up with Dr Kathyrn Sheriff as scheduled  Home Health: none Equipment/Devices: none  Discharge Condition: stable CODE STATUS: Full code  HPI: Per admitting MD, Tiffany Horn is a 61 y.o. 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  She reports that she developed a headache with dizziness and nausea.  Symptoms started about 0900.  She did have a headache yesterday and last night but the other symptoms started at work today.  No dysphagia or dysarthria.  She did have some tingling this AM in the back of her neck and her L fingers.  Also with diplopia. Her only aneurysm was in 10/2020, underwent coiling by Dr. Kathyrn Sheriff and then had a stent placed.  She saw Dr. Kathyrn Sheriff Monday and he was planning for an angiogram Monday to f/u.    Hospital Course / Discharge diagnoses: Principal Problem:   Intractable headache Active Problems:   Hyperlipidemia   Hypothyroidism   Major depressive disorder, recurrent episode, mild (HCC)   Moderate persistent asthma   Hypertension   Active problems TIA-patient presented with headache, left-sided numbness, which have all resolved.  Neurology consulted and followed patient while hospitalized.  She underwent an MRI of the brain which did not show any acute intracranial abnormalities, CT angiogram without LVO, lipid panel shows an LDL of 158, A1c still pending at the time of discharge.  Discussed with neurology, recommending 21 days of dual antiplatelet therapy with aspirin and Plavix, followed by Plavix alone. Due to prior history of aneurysm status post stent assisted coil embolization,  neurosurgery was consulted. There were initial plans for her to get a cerebral angiogram while hospitalized, however that was decided to be deferred as an outpatient.  She already has an appointment with neurosurgery next week.  Patient has returned to baseline, has no further symptoms, PT did not recommend any follow-up.  She will be discharged home in stable condition with outpatient follow-up  Active problems HTN -resume home medications  HLD -resume home medications  Hypothyroidism -Continue Synthroid Asthma -Continue home medications Anxiety/Depression  -Continue prn Klonopin, daily paroxetine   Sepsis ruled out   Discharge Instructions   Allergies as of 01/05/2022       Reactions   Abilify [aripiprazole] Nausea And Vomiting   Amoxil [amoxicillin] Diarrhea   Promethazine Hcl Other (See Comments)   Made pt feel "weird"   Seroquel [quetiapine Fumarate] Other (See Comments)   Pt does not recall reaction        Medication List     TAKE these medications    alendronate 70 MG tablet Commonly known as: FOSAMAX Take 1 tablet (70 mg total) by mouth every 7 (seven) days. Take with a full glass of water on an empty stomach. What changed:  when to take this additional instructions   amLODipine 5 MG tablet Commonly known as: NORVASC Take 1 tablet (5 mg total) by mouth daily. What changed: when to take this   ASCORBIC ACID PO Take 1 tablet by mouth every evening. Vitamin C, unknown strength.   aspirin 81 MG chewable tablet Chew 1 tablet (81 mg total) by mouth daily for 21 days. What changed: when to take this   atorvastatin  80 MG tablet Commonly known as: LIPITOR Take 1 tablet (80 mg total) by mouth daily.   Breztri Aerosphere 160-9-4.8 MCG/ACT Aero Generic drug: Budeson-Glycopyrrol-Formoterol Inhale 2 puffs into the lungs in the morning and at bedtime. What changed:  when to take this reasons to take this   clonazePAM 0.5 MG tablet Commonly known as:  KLONOPIN Take 1 tablet (0.5 mg total) by mouth daily. What changed:  when to take this reasons to take this   clopidogrel 75 MG tablet Commonly known as: PLAVIX Take 1 tablet (75 mg total) by mouth daily.   ezetimibe 10 MG tablet Commonly known as: Zetia Take 1 tablet (10 mg total) by mouth daily. What changed: when to take this   levothyroxine 125 MCG tablet Commonly known as: SYNTHROID Take 1 tablet (125 mcg total) by mouth daily. On an empty stomach. What changed:  when to take this additional instructions   PARoxetine 40 MG tablet Commonly known as: PAXIL Take 1 tablet (40 mg total) by mouth daily. What changed: when to take this   rizatriptan 5 MG tablet Commonly known as: MAXALT Take 1 tablet (5 mg total) by mouth daily as needed for migraine.   VITAMIN D-3 PO Take 1 capsule by mouth in the morning.       Consultations: Neurology Neurosurgery   Procedures/Studies:  ECHOCARDIOGRAM COMPLETE  Result Date: 01/04/2022    ECHOCARDIOGRAM REPORT   Patient Name:   Tiffany Horn Date of Exam: 01/04/2022 Medical Rec #:  [id number]        Height:       65.5 in Accession #:    [id number]       Weight:       175.0 lb Date of Birth:  03-13-1960        BSA:          1.880 m Patient Age:    70 years         BP:           145/120 mmHg Patient Gender: F                HR:           64 bpm. Exam Location:  Inpatient Procedure: 2D Echo, Color Doppler and Cardiac Doppler Indications:    TIA  History:        Patient has prior history of Echocardiogram examinations, most                 recent 12/05/2020. Risk Factors:Hypertension and Dyslipidemia.  Sonographer:    Johny Chess RDCS Referring Phys: [place]  1. Left ventricular ejection fraction, by estimation, is 55 to 60%. The left ventricle has normal function. The left ventricle has no regional wall motion abnormalities. Left ventricular diastolic parameters are consistent with Grade I diastolic dysfunction  (impaired relaxation).  2. Right ventricular systolic function is normal. The right ventricular size is normal. Tricuspid regurgitation signal is inadequate for assessing PA pressure.  3. The mitral valve is normal in structure. No evidence of mitral valve regurgitation. No evidence of mitral stenosis.  4. The aortic valve is tricuspid. Aortic valve regurgitation is not visualized. No aortic stenosis is present.  5. The inferior vena cava is normal in size with greater than 50% respiratory variability, suggesting right atrial pressure of 3 mmHg. FINDINGS  Left Ventricle: Left ventricular ejection fraction, by estimation, is 55 to 60%. The left ventricle has normal function. The left ventricle has no regional  wall motion abnormalities. The left ventricular internal cavity size was normal in size. There is  no left ventricular hypertrophy. Left ventricular diastolic parameters are consistent with Grade I diastolic dysfunction (impaired relaxation). Right Ventricle: The right ventricular size is normal. No increase in right ventricular wall thickness. Right ventricular systolic function is normal. Tricuspid regurgitation signal is inadequate for assessing PA pressure. Left Atrium: Left atrial size was normal in size. Right Atrium: Right atrial size was normal in size. Pericardium: There is no evidence of pericardial effusion. Mitral Valve: The mitral valve is normal in structure. No evidence of mitral valve regurgitation. No evidence of mitral valve stenosis. Tricuspid Valve: The tricuspid valve is normal in structure. Tricuspid valve regurgitation is not demonstrated. Aortic Valve: The aortic valve is tricuspid. Aortic valve regurgitation is not visualized. No aortic stenosis is present. Pulmonic Valve: The pulmonic valve was normal in structure. Pulmonic valve regurgitation is not visualized. Aorta: The aortic root is normal in size and structure. Venous: The inferior vena cava is normal in size with greater than 50%  respiratory variability, suggesting right atrial pressure of 3 mmHg. IAS/Shunts: No atrial level shunt detected by color flow Doppler.  LEFT VENTRICLE PLAX 2D LVIDd:         4.20 cm   Diastology LVIDs:         2.80 cm   LV e' medial:    10.10 cm/s LV PW:         0.90 cm   LV E/e' medial:  7.8 LV IVS:        1.00 cm   LV e' lateral:   13.50 cm/s LVOT diam:     1.70 cm   LV E/e' lateral: 5.8 LV SV:         46 LV SV Index:   24 LVOT Area:     2.27 cm  RIGHT VENTRICLE             IVC RV Basal diam:  2.80 cm     IVC diam: 1.20 cm RV S prime:     16.20 cm/s TAPSE (M-mode): 2.3 cm LEFT ATRIUM             Index        RIGHT ATRIUM           Index LA diam:        3.20 cm 1.70 cm/m   RA Area:     10.50 cm LA Vol (A2C):   24.3 ml 12.93 ml/m  RA Volume:   23.40 ml  12.45 ml/m LA Vol (A4C):   28.8 ml 15.32 ml/m LA Biplane Vol: 26.5 ml 14.10 ml/m  AORTIC VALVE LVOT Vmax:   95.30 cm/s LVOT Vmean:  59.000 cm/s LVOT VTI:    0.201 m  AORTA Ao Root diam: 3.20 cm Ao Asc diam:  3.30 cm MITRAL VALVE MV Area (PHT): 3.65 cm    SHUNTS MV Decel Time: 208 msec    Systemic VTI:  0.20 m MV E velocity: 78.60 cm/s  Systemic Diam: 1.70 cm MV A velocity: 77.90 cm/s MV E/A ratio:  1.01 Dalton McleanMD Electronically signed by Franki Monte Signature Date/Time: 01/04/2022/5:36:25 PM    Final    MR BRAIN WO CONTRAST  Result Date: 01/04/2022 CLINICAL DATA:  Headache.  Dizziness.  Nausea. EXAM: MRI HEAD WITHOUT CONTRAST TECHNIQUE: Multiplanar, multiecho pulse sequences of the brain and surrounding structures were obtained without intravenous contrast. COMPARISON:  Same-day CT brain and angiogram. FINDINGS: Brain: No acute infarction,  hydrocephalus, extra-axial collection or mass lesion. There is a small punctate focus of microhemorrhage in the posterior right temporal lobe (series 7, image 55). 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 Otherwise normal flow voids. Skull and upper cervical spine:  Normal marrow signal. Sinuses/Orbits: Negative. Other: None IMPRESSION: 1. No acute intracranial abnormality. 2. 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 Electronically Signed   By: Marin Roberts M.D.   On: 01/04/2022 17:06   CT ANGIO HEAD NECK W WO CM W PERF (CODE STROKE)  Result Date: 01/04/2022 CLINICAL DATA:  Provided history: Neuro deficit, acute, stroke suspected. Left-sided weakness and numbness, headache, prior basilar aneurysm coiling. EXAM: CT ANGIOGRAPHY HEAD AND NECK CT PERFUSION BRAIN TECHNIQUE: Multidetector CT imaging of the head and neck was performed using the standard protocol during bolus administration of intravenous contrast. Multiplanar CT image reconstructions and MIPs were obtained to evaluate the vascular anatomy. Carotid stenosis measurements (when applicable) are obtained utilizing NASCET criteria, using the distal internal carotid diameter as the denominator. Multiphase CT imaging of the brain was performed following IV bolus contrast injection. Subsequent parametric perfusion maps were calculated using RAPID software. RADIATION DOSE REDUCTION: This exam was performed according to the departmental dose-optimization program which includes automated exposure control, adjustment of the mA and/or kV according to patient size and/or use of iterative reconstruction technique. CONTRAST:  128m OMNIPAQUE IOHEXOL 350 MG/ML SOLN COMPARISON:  None Available. Noncontrast head CT performed earlier today 01/04/2022. CT angiogram head/neck 07/05/2021. FINDINGS: CTA NECK FINDINGS Aortic arch: The visualized aortic arch is normal in caliber. Aberrant right subclavian artery. No hemodynamically significant innominate or proximal subclavian artery stenosis. Right carotid system: CCA and ICA patent within the neck without stenosis or significant atherosclerotic disease. Partially retropharyngeal course of the cervical ICA. Left carotid system: CCA and ICA patent within the  neck without stenosis. Minimal atherosclerotic plaque about the carotid bifurcation. Vertebral arteries: Vertebral arteries patent within the neck without stenosis or significant atherosclerotic disease. The left vertebral artery is slightly dominant. Skeleton: Cervical spondylosis. No acute fracture or aggressive osseous lesion. Other neck: No neck mass or cervical lymphadenopathy. Upper chest: No consolidation within the imaged lung apices. Review of the MIP images confirms the above findings CTA HEAD FINDINGS Anterior circulation: The intracranial internal carotid arteries are patent. The M1 middle cerebral arteries are patent. No M2 proximal branch occlusion or high-grade proximal stenosis. The anterior cerebral arteries are patent. Hypoplastic right A1 segment. Posterior circulation: The intracranial vertebral arteries are patent. 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 The stent extends from the distal basilar artery into the bilateral posterior cerebral arteries to the P2 segment level. Streak and beam hardening artifact arising from the coil mass obscures the very distal basilar artery. Within this limitation, the basilar artery is patent without appreciable stenosis. Within the limitations of prominent streak and beam hardening artifact, there is no visible residual or recurrent basilar tip aneurysm. The posterior cerebral arteries are patent. Artifact partially obscures the bilateral P1 segments. Since the prior CTA of 07/25/2021, there has been interval progression of a markedly severe stenosis within the right P2 segment (immediately beyond the stent). No appreciable high-grade proximal left PCA stenosis. Venous sinuses: Within the limitations of contrast timing, no convincing thrombus. Anatomic variants: As described. Review of the MIP images confirms the above findings CT Brain Perfusion Findings: CBF (<30%) Volume: 024mPerfusion (Tmax>6.0s) volume: 26m62mhowever, a 16 mL  region of decreased perfusion is noted within the right  PCA vascular territory using the Tmax>4 seconds threshold. Mismatch Volume: 76m Infarction Location:None identified These results were called by telephone at the time of interpretation on 01/04/2022 at 10:15 am [facility] who verbally acknowledged these results. IMPRESSION: CTA neck: 1. The common carotid and internal carotid arteries are patent within the neck. Minimal atherosclerotic plaque about the left carotid bifurcation. 2. The vertebral arteries are patent within the neck without stenosis or significant atherosclerotic disease. 3. Aberrant right subclavian artery. CTA head: 1. Redemonstrated sequelae of prior stent assisted coil embolization of a basilar tip aneurysm. There is prominent streak and beam hardening artifact arising from the embolization coil mass. Within this limitation, there is no visible residual or recurrent aneurysm. 2. No intracranial large vessel occlusion is identified. 3. A markedly severe stenosis within the right PCA P2 segment (just beyond the stent) has progressed from the prior CTA of 07/25/2021. CT perfusion head: The perfusion software identifies no core infarct. The perfusion software identifies no critically hypoperfused parenchyma (utilizing the Tmax>6 seconds threshold). However, a 16 mL region of hypoperfusion is noted within the right PCA vascular territory using the Tmax > 4 seconds threshold. Electronically Signed   By: KKellie SimmeringD.O.   On: 01/04/2022 10:54   CT HEAD CODE STROKE WO CONTRAST  Result Date: 01/04/2022 CLINICAL DATA:  Code stroke. 61year old female left arm weakness. History of endovascular treated basilar tip aneurysm. EXAM: CT HEAD WITHOUT CONTRAST TECHNIQUE: Contiguous axial images were obtained from the base of the skull through the vertex without intravenous contrast. RADIATION DOSE REDUCTION: This exam was performed according to the departmental dose-optimization program  which includes automated exposure control, adjustment of the mA and/or kV according to patient size and/or use of iterative reconstruction technique. COMPARISON:  CTA head and neck 07/25/2021. brain MRI 05/07/2021. FINDINGS: Brain: Basilar tip aneurysm coil pack with stable streak artifact. Stable gray-white matter differentiation throughout the brain. No midline shift, ventriculomegaly, mass effect, evidence of mass lesion, intracranial hemorrhage or evidence of cortically based acute infarction. No convincing encephalomalacia. Vascular: Sequelae of stent assisted basilar tip aneurysm coil embolization. Subsequent streak artifact mostly obscures the carotid termini and proximal MCAs. Skull: No acute osseous abnormality identified. Sinuses/Orbits: Visualized paranasal sinuses and mastoids are stable and well aerated. Other: Visualized orbits and scalp soft tissues are within normal limits. ASPECTS ([facility]roke Program Early CT Score) Total score (0-10 with 10 being normal): 10 IMPRESSION: 1. Stable non contrast CT appearance of the brain, negative except for sequelae of stent assisted basilar tip aneurysm embolization. ASPECTS 10. 2. These results were communicated to Dr. SQuinn Axeat 10:05 am on 01/04/2022 by text page via the [facility] system. Electronically Signed   By: HGenevie AnnM.D.   On: 01/04/2022 10:06     Subjective: - no chest pain, shortness of breath, no abdominal pain, nausea or vomiting.   Discharge Exam: BP 133/89 (BP Location: Right Arm)   Pulse 80   Temp 97.8 F (36.6 C) (Oral)   Resp 15   Ht 5' 6"  (1.676 m)   Wt 79.4 kg   LMP 01/31/2007   SpO2 99%   BMI 28.25 kg/m   General: Pt is alert, awake, not in acute distress Cardiovascular: RRR, S1/S2 +, no rubs, no gallops Respiratory: CTA bilaterally, no wheezing, no rhonchi Abdominal: Soft, NT, ND, bowel sounds + Extremities: no edema, no cyanosis    The results of significant diagnostics from this hospitalization (including  imaging, microbiology, ancillary and laboratory) are  listed below for reference.     Microbiology: No results found for this or any previous visit (from the past 240 hour(s)).   Labs: Basic Metabolic Panel: Recent Labs  Lab 01/04/22 0947 01/04/22 0958  NA 140 141  K 3.7 3.8  CL 108 106  CO2 25  --   GLUCOSE 107* 107*  BUN 11 11  CREATININE 0.76 0.70  CALCIUM 9.1  --    Liver Function Tests: Recent Labs  Lab 01/04/22 0947  AST 19  ALT 19  ALKPHOS 54  BILITOT 0.5  PROT 6.9  ALBUMIN 3.9   CBC: Recent Labs  Lab 01/04/22 0947 01/04/22 0958  WBC 5.0  --   NEUTROABS 2.6  --   HGB 14.7 14.6  HCT 42.6 43.0  MCV 90.8  --   PLT 207  --    CBG: Recent Labs  Lab 01/04/22 0954  GLUCAP 106*   Hgb A1c No results for input(s): "HGBA1C" in the last 72 hours. Lipid Profile Recent Labs    01/05/22 0415  CHOL 241*  HDL 52  LDLCALC 158*  TRIG 156*  CHOLHDL 4.6   Thyroid function studies No results for input(s): "TSH", "T4TOTAL", "T3FREE", "THYROIDAB" in the last 72 hours.  Invalid input(s): "FREET3" Urinalysis    Component Value Date/Time   COLORURINE STRAW (A) 04/01/2021 1220   APPEARANCEUR CLEAR 04/01/2021 1220   LABSPEC 1.009 04/01/2021 1220   PHURINE 7.0 04/01/2021 1220   GLUCOSEU NEGATIVE 04/01/2021 1220   HGBUR NEGATIVE 04/01/2021 1220   BILIRUBINUR NEGATIVE 04/01/2021 1220   BILIRUBINUR neg 06/06/2019 1413   KETONESUR NEGATIVE 04/01/2021 1220   PROTEINUR NEGATIVE 04/01/2021 1220   UROBILINOGEN 0.2 06/06/2019 1413   UROBILINOGEN 1.0 12/27/2012 1025   NITRITE NEGATIVE 04/01/2021 1220   LEUKOCYTESUR SMALL (A) 04/01/2021 1220    FURTHER DISCHARGE INSTRUCTIONS:   Get Medicines reviewed and adjusted: Please take all your medications with you for your next visit with your Primary MD   Laboratory/radiological data: Please request your Primary MD to go over all hospital tests and procedure/radiological results at the follow up, please ask your  Primary MD to get all Hospital records sent to his/her office.   In some cases, they will be blood work, cultures and biopsy results pending at the time of your discharge. Please request that your primary care M.D. goes through all the records of your hospital data and follows up on these results.   Also Note the following: If you experience worsening of your admission symptoms, develop shortness of breath, life threatening emergency, suicidal or homicidal thoughts you must seek medical attention immediately by calling 911 or calling your MD immediately  if symptoms less severe.   You must read complete instructions/literature along with all the possible adverse reactions/side effects for all the Medicines you take and that have been prescribed to you. Take any new Medicines after you have completely understood and accpet all the possible adverse reactions/side effects.    Do not drive when taking Pain medications or sleeping medications (Benzodaizepines)   Do not take more than prescribed Pain, Sleep and Anxiety Medications. It is not advisable to combine anxiety,sleep and pain medications without talking with your primary care practitioner   Special Instructions: If you have smoked or chewed Tobacco  in the last 2 yrs please stop smoking, stop any regular Alcohol  and or any Recreational drug use.   Wear Seat belts while driving.   Please note: You were cared for by a hospitalist  during your hospital stay. Once you are discharged, your primary care physician will handle any further medical issues. Please note that NO REFILLS for any discharge medications will be authorized once you are discharged, as it is imperative that you return to your primary care physician (or establish a relationship with a primary care physician if you do not have one) for your post hospital discharge needs so that they can reassess your need for medications and monitor your lab values.  Time coordinating discharge: 40  minutes  SIGNED:  Marzetta Board, MD, PhD 01/05/2022, 1:48 PM

## 2022-01-05 NOTE — Progress Notes (Signed)
Pt with orders to d/c home. Pt is stable. PIV removed. Discharge education and packet provided to pt all questions answered. Prescriptions sent to pharmacy pt is aware. Pt ambulatory to private vehicle.

## 2022-01-05 NOTE — Progress Notes (Signed)
PT Cancellation Note  Patient Details Name: Tiffany Horn MRN: [id number] DOB: 09/30/1960   Cancelled Treatment:    Reason Eval/Treat Not Completed: PT screened, no needs identified, will sign off. Per discussion with the patient she feels back to baseline other than mild blurred vision (this is improved compared to yesterday and she contributes this partially to not wearing her glasses). Pt was able to ambulate independently with OT this morning and reports she is mobilizing at her baseline level. Pt in agreement that no PT eval is indicated at this time. Acute PT will sign off. Please re-consult if any mobility concerns arise.   Zenaida Niece 01/05/2022, 9:32 AM

## 2022-01-09 ENCOUNTER — Telehealth: Payer: Self-pay

## 2022-01-09 NOTE — Telephone Encounter (Signed)
Transition Care Management Unsuccessful Follow-up Telephone Call  Date of discharge and from where: [place] 01-05-22 Dx: intractable headache   Attempts:  1st Attempt  Reason for unsuccessful TCM follow-up call:  Unable to reach patient- pt states she is at work and unable to talk at this time- please try again

## 2022-01-13 ENCOUNTER — Other Ambulatory Visit: Payer: Self-pay

## 2022-01-13 ENCOUNTER — Ambulatory Visit (HOSPITAL_COMMUNITY)
Admission: RE | Admit: 2022-01-13 | Discharge: 2022-01-13 | Disposition: A | Discharge status: Home / Self Care | Payer: BC Managed Care – PPO | Source: Ambulatory Visit | Attending: Neurosurgery | Admitting: Neurosurgery

## 2022-01-13 DIAGNOSIS — Z7902 Long term (current) use of antithrombotics/antiplatelets: Secondary | ICD-10-CM | POA: Insufficient documentation

## 2022-01-13 DIAGNOSIS — I671 Cerebral aneurysm, nonruptured: Secondary | ICD-10-CM | POA: Diagnosis present

## 2022-01-13 HISTORY — PX: IR ANGIO VERTEBRAL SEL VERTEBRAL BILAT MOD SED: IMG5369

## 2022-01-13 HISTORY — PX: IR ANGIO VERTEBRAL SEL VERTEBRAL UNI L MOD SED: IMG5367

## 2022-01-13 MED ORDER — FENTANYL CITRATE (PF) 100 MCG/2ML IJ SOLN
INTRAMUSCULAR | Status: AC
  Filled 2022-01-13: qty 2

## 2022-01-13 MED ORDER — HEPARIN SODIUM (PORCINE) 1000 UNIT/ML IJ SOLN
INTRAMUSCULAR | Status: AC
  Filled 2022-01-13: qty 10

## 2022-01-13 MED ORDER — MIDAZOLAM HCL 2 MG/2ML IJ SOLN
INTRAMUSCULAR | Status: AC
  Filled 2022-01-13: qty 2

## 2022-01-13 MED ORDER — HYDROCODONE-ACETAMINOPHEN 5-325 MG PO TABS: 1.0000 | ORAL_TABLET | ORAL | Status: DC | PRN

## 2022-01-13 MED ORDER — FENTANYL CITRATE (PF) 100 MCG/2ML IJ SOLN
INTRAMUSCULAR | Status: AC | PRN
  Administered 2022-01-13: 25 ug via INTRAVENOUS

## 2022-01-13 MED ORDER — LIDOCAINE HCL 1 % IJ SOLN
INTRAMUSCULAR | Status: AC
  Filled 2022-01-13: qty 20

## 2022-01-13 MED ORDER — HEPARIN SODIUM (PORCINE) 1000 UNIT/ML IJ SOLN
INTRAMUSCULAR | Status: AC | PRN
  Administered 2022-01-13: 2000 [IU] via INTRAVENOUS

## 2022-01-13 MED ORDER — IOHEXOL 300 MG/ML  SOLN
100.0000 mL | Freq: Once | INTRAMUSCULAR | Status: AC | PRN
  Administered 2022-01-13: 25 mL via INTRA_ARTERIAL

## 2022-01-13 MED ORDER — MIDAZOLAM HCL 2 MG/2ML IJ SOLN
INTRAMUSCULAR | Status: AC | PRN
  Administered 2022-01-13: 1 mg via INTRAVENOUS

## 2022-01-13 MED ORDER — VANCOMYCIN HCL IN DEXTROSE 1-5 GM/200ML-% IV SOLN: 1000.0000 mg | INTRAVENOUS | Status: DC

## 2022-01-13 MED ORDER — SODIUM CHLORIDE 0.9 % IV SOLN: INTRAVENOUS | Status: DC

## 2022-01-13 NOTE — Sedation Documentation (Addendum)
Remains bedrest HOB flay RLE straight. Bedside handoff care and assessment with Lavella Lemons, RN

## 2022-01-13 NOTE — Progress Notes (Signed)
Called Dr Kathyrn Sheriff, no labs needed since all were done within one week

## 2022-01-13 NOTE — H&P (Signed)
Chief Complaint   Aneurysm  History of Present Illness  ARLYSS WEATHERSBY is a 61 y.o. female Who was initially seen more than a year ago with headache and blurry vision.  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  While she did well immediately postoperatively, she has had multiple episodes over the past year of headache, blurry vision, and unilateral weakness.  She has undergone multiple code stroke workups which have been largely negative.  Most recent similar episode was about a week ago at which time CT, CT perfusion, and CT angiogram as well as M RI were all unremarkable.  Patient presents today for scheduled routine follow-up angiogram of her previously stent coiled aneurysm.  Since her episode last week she has been restarted on dual antiplatelet therapy.  Past Medical History   Past Medical History:  Diagnosis Date   Anxiety    Arthritis    knees and spine, shoulder   Asthma    Bronchitis    hx - recurrent   Complication of anesthesia    waking up is not easy   Depression    Elevated IgE level 09/12/2017   09/12/2017 IgE 195   History of COVID-19 12/30/2020   Hx of irritable bowel syndrome    x2   Hyperlipidemia    diet controlled - no medication   Hypertension    not taking any meds at present - under control per patient   Hypokalemia    with PNA admission (2.5)   Hypothyroidism (acquired)    Ischemic cerebrovascular accident (CVA) ([place])    Migraines    Pneumonia    4 episodes; hosp. admission 2014   PONV (postoperative nausea and vomiting)    TIA (transient ischemic attack) 12/17/2020   UC (ulcerative colitis) [facility])    DX'D 2021    Past Surgical History   Past Surgical History:  Procedure Laterality Date   BREAST SURGERY     implants, then had them removed   COLONOSCOPY     greater 10 yrs ago - ? [facility]-2017 LAST   DILATION AND CURETTAGE OF UTERUS     IR ANGIO INTRA  EXTRACRAN SEL INTERNAL CAROTID BILAT MOD SED  11/19/2020   IR ANGIO VERTEBRAL SEL VERTEBRAL BILAT MOD SED  11/19/2020   IR ANGIO VERTEBRAL SEL VERTEBRAL UNI L MOD SED  02/25/2021   IR ANGIOGRAM FOLLOW UP STUDY  11/19/2020   IR ANGIOGRAM FOLLOW UP STUDY  11/19/2020   IR ANGIOGRAM FOLLOW UP STUDY  11/19/2020   IR ANGIOGRAM FOLLOW UP STUDY  11/19/2020   IR ANGIOGRAM FOLLOW UP STUDY  11/19/2020   IR ANGIOGRAM FOLLOW UP STUDY  11/19/2020   IR ANGIOGRAM FOLLOW UP STUDY  11/19/2020   IR ANGIOGRAM FOLLOW UP STUDY  11/19/2020   IR INTRA CRAN STENT  11/19/2020   IR TRANSCATH/EMBOLIZ  11/19/2020   LAPAROSCOPIC ABDOMINAL EXPLORATION  01/31/1992   endometriosis   ORIF HUMERUS FRACTURE Left 04/01/2013   DR Ninfa Linden - shoulder   ORIF HUMERUS FRACTURE Left 04/01/2013   Procedure: OPEN REDUCTION INTERNAL FIXATION (ORIF) LEFT PROXIMAL HUMERUS FRACTURE;  Surgeon: Mcarthur Rossetti, MD;  Location: [facility];  Service: Orthopedics;  Laterality: Left;   RADIOLOGY WITH ANESTHESIA N/A 11/19/2020   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;  Surgeon: Consuella Lose, MD;  Location: [facility];  Service: Radiology;  Laterality: N/A;   TONSILLECTOMY AND ADENOIDECTOMY      Social History  Social History   Tobacco Use   Smoking status: Never    Passive exposure: Never   Smokeless tobacco: Never  Vaping Use   Vaping Use: Never used  Substance Use Topics   Alcohol use: Yes    Alcohol/week: 1.0 standard drink of alcohol    Types: 1 Glasses of wine per week   Drug use: No    Medications   Prior to Admission medications   Medication Sig Start Date End Date Taking? Authorizing Provider  amLODipine (NORVASC) 5 MG tablet Take 1 tablet (5 mg total) by mouth daily. Patient taking differently: Take 5 mg by mouth in the morning. 09/13/21  Yes Kuneff, Renee A, DO  aspirin 81 MG chewable tablet Chew 1 tablet (81 mg total) by mouth daily for 21 days. 01/05/22 01/26/22 Yes Gherghe, Vella Redhead, MD   Budeson-Glycopyrrol-Formoterol (BREZTRI AEROSPHERE) 160-9-4.8 MCG/ACT AERO Inhale 2 puffs into the lungs in the morning and at bedtime. Patient taking differently: Inhale 2 puffs into the lungs 2 (two) times daily as needed (asthma). 04/28/20  Yes Icard, Octavio Graves, DO  Cholecalciferol (VITAMIN D-3 PO) Take 1 capsule by mouth in the morning.   Yes [provider]  clonazePAM (KLONOPIN) 0.5 MG tablet Take 1 tablet (0.5 mg total) by mouth daily. Patient taking differently: Take 0.5 mg by mouth daily as needed for anxiety. 09/13/21  Yes Kuneff, Renee A, DO  clopidogrel (PLAVIX) 75 MG tablet Take 1 tablet (75 mg total) by mouth daily. 01/05/22  Yes Gherghe, Vella Redhead, MD  ezetimibe (ZETIA) 10 MG tablet Take 1 tablet (10 mg total) by mouth daily. Patient taking differently: Take 10 mg by mouth every evening. 09/16/21  Yes Kuneff, Renee A, DO  levothyroxine (SYNTHROID) 125 MCG tablet Take 1 tablet (125 mcg total) by mouth daily. On an empty stomach. Patient taking differently: Take 125 mcg by mouth in the morning. 09/14/21  Yes Kuneff, Renee A, DO  PARoxetine (PAXIL) 40 MG tablet Take 1 tablet (40 mg total) by mouth daily. Patient taking differently: Take 40 mg by mouth in the morning. 09/13/21  Yes Kuneff, Renee A, DO  rizatriptan (MAXALT) 5 MG tablet Take 1 tablet (5 mg total) by mouth daily as needed for migraine. 09/13/21  Yes Kuneff, Renee A, DO  alendronate (FOSAMAX) 70 MG tablet Take 1 tablet (70 mg total) by mouth every 7 (seven) days. Take with a full glass of water on an empty stomach. Patient taking differently: Take 70 mg by mouth every Monday. 09/13/21   Kuneff, Renee A, DO  ASCORBIC ACID PO Take 1 tablet by mouth every evening. Vitamin C, unknown strength.    [provider]  atorvastatin (LIPITOR) 80 MG tablet Take 1 tablet (80 mg total) by mouth daily. Patient not taking: Reported on 01/04/2022 09/13/21   Howard Pouch A, DO    Allergies   Allergies  Allergen Reactions    Abilify [Aripiprazole] Nausea And Vomiting   Amoxil [Amoxicillin] Diarrhea   Promethazine Hcl Other (See Comments)    Made pt feel "weird"   Seroquel [Quetiapine Fumarate] Other (See Comments)    Pt does not recall reaction    Review of Systems  ROS  Neurologic Exam  Awake, alert, oriented Memory and concentration grossly intact Speech fluent, appropriate CN grossly intact Motor exam: Upper Extremities Deltoid Bicep Tricep Grip  Right 5/5 5/5 5/5 5/5  Left 5/5 5/5 5/5 5/5   Lower Extremities IP Quad PF DF EHL  Right 5/5 5/5 5/5 5/5 5/5  Left 5/5 5/5 5/5 5/5 5/5   Sensation grossly intact to LT   Impression  - 61 y.o. female Status post stent supported coil embolization of a basilar apex aneurysm with multiple episodes of transient headache and localizing neurologic symptoms of unclear etiology.  Multiple stroke workups have been negative thus far.  Plan  - Will proceed with diagnostic cerebral angiogram  I have reviewed the indications for the procedure as well as the details of the procedure and the expected postoperative course and recovery at length with the patient in the office. We have also reviewed in detail the risks, benefits, and alternatives to the procedure. All questions were answered and Caren Macadam provided informed consent to proceed.  Consuella Lose, MD [facility]

## 2022-01-13 NOTE — Brief Op Note (Addendum)
  NEUROSURGERY BRIEF OPERATIVE  NOTE   PREOP DX: Aneurysm  POSTOP DX: Same  PROCEDURE: Diagnostic cerebral angiogram  SURGEON: Dr. Consuella Lose, MD  ANESTHESIA: IV Sedation with Local  APPROACH: Right trans-femoral  EBL: Minimal  SPECIMENS: None  COMPLICATIONS: None  CONDITION: Stable to recovery  FINDINGS (Full report in [facility]): 1. Relatively severe stenosis of the stented right P1 without significant apparent flow limitation, overall stable in appearance in comparison to angiogram of Jan 2023. 2. Increased size of basilar aneurysm recanalization from Jan 2023   Consuella Lose, MD [facility]

## 2022-01-15 ENCOUNTER — Other Ambulatory Visit: Payer: Self-pay | Admitting: Neurosurgery

## 2022-01-15 ENCOUNTER — Encounter (HOSPITAL_COMMUNITY): Payer: Self-pay

## 2022-01-15 DIAGNOSIS — I671 Cerebral aneurysm, nonruptured: Secondary | ICD-10-CM

## 2022-01-17 ENCOUNTER — Other Ambulatory Visit: Payer: Self-pay | Admitting: Family Medicine

## 2022-01-27 ENCOUNTER — Encounter: Payer: Self-pay | Admitting: Family Medicine

## 2022-01-27 ENCOUNTER — Ambulatory Visit: Payer: BC Managed Care – PPO | Admitting: Family Medicine

## 2022-01-27 VITALS — BP 110/77 | HR 90 | Temp 98.2°F | Ht 66.0 in | Wt 170.0 lb

## 2022-01-27 DIAGNOSIS — E782 Mixed hyperlipidemia: Secondary | ICD-10-CM

## 2022-01-27 DIAGNOSIS — I725 Aneurysm of other precerebral arteries: Secondary | ICD-10-CM

## 2022-01-27 DIAGNOSIS — Z8249 Family history of ischemic heart disease and other diseases of the circulatory system: Secondary | ICD-10-CM

## 2022-01-27 DIAGNOSIS — I639 Cerebral infarction, unspecified: Secondary | ICD-10-CM | POA: Diagnosis not present

## 2022-01-27 DIAGNOSIS — G459 Transient cerebral ischemic attack, unspecified: Secondary | ICD-10-CM

## 2022-01-27 DIAGNOSIS — F330 Major depressive disorder, recurrent, mild: Secondary | ICD-10-CM

## 2022-01-27 DIAGNOSIS — F419 Anxiety disorder, unspecified: Secondary | ICD-10-CM

## 2022-01-27 DIAGNOSIS — I10 Essential (primary) hypertension: Secondary | ICD-10-CM | POA: Diagnosis not present

## 2022-01-27 MED ORDER — RIZATRIPTAN BENZOATE 5 MG PO TABS: 5.0000 mg | ORAL_TABLET | Freq: Every day | ORAL | 5 refills | Status: DC | PRN

## 2022-01-27 MED ORDER — CLONAZEPAM 0.5 MG PO TABS: 0.5000 mg | ORAL_TABLET | Freq: Two times a day (BID) | ORAL | 1 refills | Status: DC | PRN

## 2022-01-27 MED ORDER — AMLODIPINE BESYLATE 10 MG PO TABS: 10.0000 mg | ORAL_TABLET | Freq: Every day | ORAL | 1 refills | Status: DC

## 2022-01-27 MED ORDER — PAROXETINE HCL 40 MG PO TABS: 40.0000 mg | ORAL_TABLET | Freq: Every day | ORAL | 1 refills | Status: DC

## 2022-01-27 MED ORDER — ATORVASTATIN CALCIUM 80 MG PO TABS: 80.0000 mg | ORAL_TABLET | Freq: Every day | ORAL | 1 refills | Status: DC

## 2022-01-27 NOTE — Progress Notes (Signed)
Tiffany Horn , 16-Nov-1960, 61 y.o., female MRN: [id number] Patient Care Team    Relationship Specialty Notifications Start End  [name], DO PCP - General Family Medicine  04/28/20   Jerline Pain, MD PCP - Cardiology Cardiology  03/01/20   [name], MD Consulting Physician Gastroenterology  05/29/16   [name], CNM Referring Physician Certified Nurse Midwife  05/29/16   Gari Crown, MD Referring Physician Obstetrics and Gynecology  07/17/17   Garner Nash, DO Consulting Physician Pulmonary Disease  06/12/19     Chief Complaint  Patient presents with   Hypertension     Subjective: Tiffany Horn is a  61 y.o. Pt presents for an OV with concerns of elevated blood pressure postprocedure.  Patient has a history of hypertension, CVA, TIA and basilar artery aneurysm.  She has a strong family history of heart disease.  She is compliant with amlodipine 10 mg daily, atorvastatin 80 mg daily and Zetia.  She is established with both cardiology and neurology. Reports compliance with Plavix 75 mg daily-managed by specialty team and baby aspirin daily. Blood pressure over the last year has been controlled on amlodipine 10 mg daily.  Review of blood pressure flowsheet over the last year shows intermittent elevated blood pressures, typically during hospitalizations or procedures.  Patient has a history of anxiety and is prescribed Paxil and Klonopin as needed.     09/13/2021    2:52 PM 04/12/2021    2:14 PM 03/07/2021    8:33 AM 11/25/2020    9:44 AM 05/05/2020   10:48 AM  Depression screen PHQ 2/9  Decreased Interest 1 1 0 0 0  Down, Depressed, Hopeless 0 1 0 0 0  PHQ - 2 Score 1 2 0 0 0  Altered sleeping 3 0     Tired, decreased energy 2 0     Change in appetite 0 0     Feeling bad or failure about yourself  0 0     Trouble concentrating 2 0     Moving slowly or fidgety/restless 0 0     Suicidal thoughts 0 0     PHQ-9 Score 8 2       Allergies   Allergen Reactions   Abilify [Aripiprazole] Nausea And Vomiting   Amoxil [Amoxicillin] Diarrhea   Promethazine Hcl Other (See Comments)    Made pt feel "weird"   Seroquel [Quetiapine Fumarate] Other (See Comments)    Pt does not recall reaction   Social History   Social History Narrative   She is originally from [place]. She has traveled to [facility], [place], [place], [place], [place], [place], [place], [place], [place]. No international travel. She has dogs. No prior bird, mold, or recent hot tub exposure. She hasn't used her hot tub in 1.5 years. 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 Previously enjoyed gardening and playing tennis. Helps to care for her mother.   Past Medical History:  Diagnosis Date   Anxiety    Arthritis    knees and spine, shoulder   Asthma    Bronchitis    hx - recurrent   Complication of anesthesia    waking up is not easy   Depression    Elevated IgE level 09/12/2017   09/12/2017 IgE 195   History of COVID-19 12/30/2020   Hx of irritable bowel syndrome    x2   Hyperlipidemia  diet controlled - no medication   Hypertension    not taking any meds at present - under control per patient   Hypokalemia    with PNA admission (2.5)   Hypothyroidism (acquired)    Ischemic cerebrovascular accident (CVA) ([place])    Migraines    Pneumonia    4 episodes; hosp. admission 2014   PONV (postoperative nausea and vomiting)    TIA (transient ischemic attack) 12/17/2020   UC (ulcerative colitis) [facility])    DX'D 2021   Past Surgical History:  Procedure Laterality Date   BREAST SURGERY     implants, then had them removed   COLONOSCOPY     greater 10 yrs ago - ? [facility]-2017 LAST   DILATION AND CURETTAGE OF UTERUS     IR ANGIO INTRA EXTRACRAN SEL INTERNAL CAROTID BILAT MOD SED  11/19/2020   IR ANGIO VERTEBRAL SEL VERTEBRAL BILAT MOD SED  11/19/2020   IR ANGIO VERTEBRAL SEL VERTEBRAL UNI L MOD SED  02/25/2021   IR ANGIO VERTEBRAL SEL  VERTEBRAL UNI L MOD SED  01/13/2022   IR ANGIOGRAM FOLLOW UP STUDY  11/19/2020   IR ANGIOGRAM FOLLOW UP STUDY  11/19/2020   IR ANGIOGRAM FOLLOW UP STUDY  11/19/2020   IR ANGIOGRAM FOLLOW UP STUDY  11/19/2020   IR ANGIOGRAM FOLLOW UP STUDY  11/19/2020   IR ANGIOGRAM FOLLOW UP STUDY  11/19/2020   IR ANGIOGRAM FOLLOW UP STUDY  11/19/2020   IR ANGIOGRAM FOLLOW UP STUDY  11/19/2020   IR INTRA CRAN STENT  11/19/2020   IR TRANSCATH/EMBOLIZ  11/19/2020   LAPAROSCOPIC ABDOMINAL EXPLORATION  01/31/1992   endometriosis   ORIF HUMERUS FRACTURE Left 04/01/2013   DR Ninfa Linden - shoulder   ORIF HUMERUS FRACTURE Left 04/01/2013   Procedure: OPEN REDUCTION INTERNAL FIXATION (ORIF) LEFT PROXIMAL HUMERUS FRACTURE;  Surgeon: Mcarthur Rossetti, MD;  Location: [facility];  Service: Orthopedics;  Laterality: Left;   RADIOLOGY WITH ANESTHESIA N/A 11/19/2020   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;  Surgeon: Consuella Lose, MD;  Location: [facility];  Service: Radiology;  Laterality: N/A;   TONSILLECTOMY AND ADENOIDECTOMY     Family History  Problem Relation Age of Onset   Breast cancer Mother    Diabetes Mother    Heart disease Mother    Diabetes Father    Dementia Father    Colon cancer Father    Stroke Paternal Grandmother    Diabetes Paternal Grandmother    Colon polyps Neg Hx    Esophageal cancer Neg Hx    Stomach cancer Neg Hx    Rectal cancer Neg Hx    Allergies as of 01/27/2022       Reactions   Abilify [aripiprazole] Nausea And Vomiting   Amoxil [amoxicillin] Diarrhea   Promethazine Hcl Other (See Comments)   Made pt feel "weird"   Seroquel [quetiapine Fumarate] Other (See Comments)   Pt does not recall reaction        Medication List        Accurate as of January 27, 2022 11:47 AM. If you have any questions, ask your nurse or doctor.          alendronate 70 MG tablet Commonly known as: FOSAMAX Take 1 tablet (70 mg total) by mouth every 7 (seven) days.  Take with a full glass of water on an empty stomach. What changed:  when to take this additional instructions   amLODipine 10 MG tablet Commonly known as: NORVASC Take 1  tablet (10 mg total) by mouth daily. What changed:  medication strength how much to take Changed by: Howard Pouch, DO   ASCORBIC ACID PO Take 1 tablet by mouth every evening. Vitamin C, unknown strength.   aspirin EC 81 MG tablet Take 81 mg by mouth daily. Swallow whole.   atorvastatin 80 MG tablet Commonly known as: LIPITOR Take 1 tablet (80 mg total) by mouth daily.   Breztri Aerosphere 160-9-4.8 MCG/ACT Aero Generic drug: Budeson-Glycopyrrol-Formoterol Inhale 2 puffs into the lungs in the morning and at bedtime. What changed:  when to take this reasons to take this   clonazePAM 0.5 MG tablet Commonly known as: KLONOPIN Take 1 tablet (0.5 mg total) by mouth 2 (two) times daily as needed for anxiety. What changed:  when to take this reasons to take this Changed by: Howard Pouch, DO   clopidogrel 75 MG tablet Commonly known as: PLAVIX Take 1 tablet (75 mg total) by mouth daily.   ezetimibe 10 MG tablet Commonly known as: Zetia Take 1 tablet (10 mg total) by mouth daily. What changed: when to take this   levothyroxine 125 MCG tablet Commonly known as: SYNTHROID Take 1 tablet (125 mcg total) by mouth daily. On an empty stomach. What changed:  when to take this additional instructions   PARoxetine 40 MG tablet Commonly known as: PAXIL Take 1 tablet (40 mg total) by mouth daily. What changed: when to take this   rizatriptan 5 MG tablet Commonly known as: MAXALT Take 1 tablet (5 mg total) by mouth daily as needed for migraine.   VITAMIN D-3 PO Take 1 capsule by mouth in the morning.        All past medical history, surgical history, allergies, family history, immunizations andmedications were updated in the EMR today and reviewed under the history and medication portions of their EMR.      ROS Negative, with the exception of above mentioned in HPI   Objective:  BP 110/77   Pulse 90   Temp 98.2 F (36.8 C) (Oral)   Ht 5' 6"  (1.676 m)   Wt 170 lb (77.1 kg)   LMP 01/31/2007   SpO2 97%   BMI 27.44 kg/m  Body mass index is 27.44 kg/m. Physical Exam Vitals and nursing note reviewed.  Constitutional:      General: She is not in acute distress.    Appearance: Normal appearance. She is not ill-appearing, toxic-appearing or diaphoretic.  HENT:     Head: Normocephalic and atraumatic.  Eyes:     General: No scleral icterus.       Right eye: No discharge.        Left eye: No discharge.     Extraocular Movements: Extraocular movements intact.     Conjunctiva/sclera: Conjunctivae normal.     Pupils: Pupils are equal, round, and reactive to light.  Cardiovascular:     Rate and Rhythm: Normal rate and regular rhythm.  Pulmonary:     Effort: Pulmonary effort is normal. No respiratory distress.     Breath sounds: Normal breath sounds. No wheezing, rhonchi or rales.  Musculoskeletal:     Right lower leg: No edema.  Skin:    General: Skin is warm and dry.     Findings: No rash.  Neurological:     Mental Status: She is alert and oriented to person, place, and time. Mental status is at baseline.     Motor: No weakness.     Gait: Gait normal.  Psychiatric:  Mood and Affect: Mood normal.        Behavior: Behavior normal.        Thought Content: Thought content normal.        Judgment: Judgment normal.    No results found. No results found. No results found for this or any previous visit (from the past 24 hour(s)).  Assessment/Plan: MARIGENE ERLER is a 61 y.o. 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  Continue atorvastatin 80 mg qd Continue amlodipine 10 mg  daily Discussed repatha if needed. Goal 70-100 LDL Continue ASA/Plavix supplied by neuro Continue follow-up with neuro Continue follow-up with cardiology   Anxiety/major depression Stable Continue Paxil 40 mg daily Continue Klonopin daily as needed, added an extra dose as needed during procedures in advance. Agreed to take over her Klonopin prescription.  Patient understands she will need face-to-face follow-up for any refills every 6 months before running out of medications. Contract/UDS UTD [place] controlled substance database reviewed today We discussed routine exercises beneficial for both anxiety and cardiovascular health.  We also discussed dietary changes to help with healthy weight loss.  Reviewed expectations re: course of current medical issues. Discussed self-management of symptoms. Outlined signs and symptoms indicating need for more acute intervention. Patient verbalized understanding and all questions were answered. Patient received an After-Visit Summary.    No orders of the defined types were placed in this encounter.  Meds ordered this encounter  Medications   amLODipine (NORVASC) 10 MG tablet    Sig: Take 1 tablet (10 mg total) by mouth daily.    Dispense:  90 tablet    Refill:  1    Dc 5 mg dose   clonazePAM (KLONOPIN) 0.5 MG tablet    Sig: Take 1 tablet (0.5 mg total) by mouth 2 (two) times daily as needed for anxiety.    Dispense:  180 tablet    Refill:  1   PARoxetine (PAXIL) 40 MG tablet    Sig: Take 1 tablet (40 mg total) by mouth daily.    Dispense:  90 tablet    Refill:  1   atorvastatin (LIPITOR) 80 MG tablet    Sig: Take 1 tablet (80 mg total) by mouth daily.    Dispense:  90 tablet    Refill:  1   rizatriptan (MAXALT) 5 MG tablet    Sig: Take 1 tablet (5 mg total) by mouth daily as needed for migraine.    Dispense:  10 tablet    Refill:  5   Referral Orders  No referral(s) requested today     Note is dictated utilizing voice  recognition software. Although note has been proof read prior to signing, occasional typographical errors still can be missed. If any questions arise, please do not hesitate to call for verification.   electronically signed by:  Howard Pouch, DO  [place]

## 2022-01-27 NOTE — Patient Instructions (Addendum)
Return in about 24 weeks (around 07/14/2022) for Routine chronic condition follow-up.        Great to see you today.

## 2022-01-31 NOTE — Progress Notes (Deleted)
Cardiology Office Note:    Date:  01/31/2022   ID:  AMERICUS SCHEURICH, DOB 1960-07-18, MRN [id number]  PCP:  [name], DO  [place] Prov[facility] Cardiologist:  Candee Furbish, MD { Click to update primary MD,subspecialty MD or APP then REFRESH:1}    Referring MD: [name], DO   Chief Complaint:  No chief complaint on file. {Click here for Visit Info    :1}    History of Present Illness:   Tiffany Horn is a 62 y.o. 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  Patient last saw Dr. Marlou Porch 03/2020 and had some chest pain.  Coronary CTA 03/2020 calcium score 0 and no evidence CAD       Past Medical History:  Diagnosis Date   Anxiety    Arthritis    knees and spine, shoulder   Asthma    Bronchitis    hx - recurrent   Complication of anesthesia    waking up is not easy   Depression    Elevated IgE level 09/12/2017   09/12/2017 IgE 195   History of COVID-19 12/30/2020   Hx of irritable bowel syndrome    x2   Hyperlipidemia    diet controlled - no medication   Hypertension    not taking any meds at present - under control per patient   Hypokalemia    with PNA admission (2.5)   Hypothyroidism (acquired)    Ischemic cerebrovascular accident (CVA) ([place])    Migraines    Pneumonia    4 episodes; hosp. admission 2014   PONV (postoperative nausea and vomiting)    TIA (transient ischemic attack) 12/17/2020   UC (ulcerative colitis) [facility])    DX'D 2021   Current Medications: No outpatient medications have been marked as taking for the 02/07/22 encounter (Appointment) with Imogene Burn, PA-C.    Allergies:   Abilify [aripiprazole], Amoxil [amoxicillin], Promethazine hcl, and Seroquel [quetiapine fumarate]   Social History   Tobacco Use   Smoking status: Never    Passive exposure: Never   Smokeless tobacco: Never  Vaping Use   Vaping Use: Never used  Substance Use Topics    Alcohol use: Yes    Alcohol/week: 1.0 standard drink of alcohol    Types: 1 Glasses of wine per week   Drug use: No    Family Hx: The patient's family history includes Breast cancer in her mother; Colon cancer in her father; Dementia in her father; Diabetes in her father, mother, and paternal grandmother; Heart disease in her mother; Stroke in her paternal grandmother. There is no history of Colon polyps, Esophageal cancer, Stomach cancer, or Rectal cancer.  ROS     Physical Exam:    VS:  LMP 01/31/2007     Wt Readings from Last 3 Encounters:  01/27/22 170 lb (77.1 kg)  01/13/22 170 lb (77.1 kg)  01/04/22 175 lb (79.4 kg)    Physical Exam  GEN: Well nourished, well developed, in no acute distress  HEENT: normal  Neck: no JVD, carotid bruits, or masses Cardiac:RRR; no murmurs, rubs, or gallops  Respiratory:  clear to auscultation bilaterally, normal work of breathing GI: soft, nontender, nondistended, + BS Ext: without cyanosis, clubbing, or edema, Good distal pulses bilaterally MS: no deformity or atrophy  Skin: warm and dry, no rash Neuro:  Alert and Oriented x 3, Strength and sensation are intact Psych: euthymic mood, full affect  EKGs/Labs/Other Test Reviewed:    EKG:  EKG is *** ordered today.  The ekg ordered today demonstrates ***  Recent Labs: 09/13/2021: TSH 3.37 01/04/2022: ALT 19; BUN 11; Creatinine, Ser 0.70; Hemoglobin 14.6; Platelets 207; Potassium 3.8; Sodium 141   Recent Lipid Panel Recent Labs    09/13/21 1501 01/05/22 0415  CHOL  --  241*  TRIG  --  156*  HDL  --  52  VLDL  --  31  LDLCALC  --  158*  LDLDIRECT 165*  --      Prior CV Studies: {Select studies to display:26339}  Coronary CTA 03/2020 IMPRESSION: 1. Coronary calcium score of 0. This was 0 percentile for age and sex matched control.   2. Normal coronary origin with right dominance.   3. No evidence of CAD.   4. CAD-RADS 0. No evidence of CAD (0%). Consider  non-atherosclerotic causes of chest pain.     Risk Assessment/Calculations/Metrics:   {Does this patient have ATRIAL FIBRILLATION?:[phone]}     No BP recorded.  {Refresh Note OR Click here to enter BP  :1}***    ASSESSMENT & PLAN:   No problem-specific Assessment & Plan notes found for this encounter.   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 stress test, cath, DCCV, TEE, etc)?   Press F2        :[id number]}   Dispo:  No follow-ups on file.   Medication Adjustments/Labs and Tests Ordered: Current medicines are reviewed at length with the patient today.  Concerns regarding medicines are outlined above.  Tests Ordered: No orders of the defined types were placed in this encounter.  Medication Changes: No orders of the defined types were placed in this encounter.  Signed, Ermalinda Barrios, PA-C  01/31/2022 3:10 PM    [address], [place], [place]  [address] Phone: [phone]; Fax: [phone]

## 2022-02-07 ENCOUNTER — Ambulatory Visit: Payer: BC Managed Care – PPO | Admitting: Physician Assistant

## 2022-02-08 ENCOUNTER — Ambulatory Visit: Payer: BC Managed Care – PPO | Admitting: Cardiology

## 2022-02-13 ENCOUNTER — Ambulatory Visit (INDEPENDENT_AMBULATORY_CARE_PROVIDER_SITE_OTHER): Payer: BC Managed Care – PPO

## 2022-02-13 ENCOUNTER — Telehealth: Payer: Self-pay | Admitting: Cardiology

## 2022-02-13 ENCOUNTER — Ambulatory Visit: Payer: BC Managed Care – PPO | Admitting: Physician Assistant

## 2022-02-13 ENCOUNTER — Encounter: Payer: Self-pay | Admitting: Physician Assistant

## 2022-02-13 DIAGNOSIS — G8929 Other chronic pain: Secondary | ICD-10-CM

## 2022-02-13 DIAGNOSIS — M25512 Pain in left shoulder: Secondary | ICD-10-CM

## 2022-02-13 DIAGNOSIS — M545 Low back pain, unspecified: Secondary | ICD-10-CM

## 2022-02-13 DIAGNOSIS — R0781 Pleurodynia: Secondary | ICD-10-CM

## 2022-02-13 NOTE — Telephone Encounter (Signed)
  Pt c/o of Chest Pain: STAT if CP now or developed within 24 hours  1. Are you having CP right now? No   2. Are you experiencing any other symptoms (ex. SOB, nausea, vomiting, sweating)? nausea, vomiting, sweating  3. How long have you been experiencing CP? Couple of months   4. Is your CP continuous or coming and going? Coming and going   5. Have you taken Nitroglycerin? No   Pt said, for the last couple of months been having on and off CP and the episode is getting often. She said, it happened Saturday night and yesterday it was so bad that she called EMS, she said, it happened while watching TV, her chest suddenly hurt, she stood up feeling dizzy and   Nauseated, cold sweats and pain last 20 mins. EMS checked her BP it was normal and EKG shows she is not having heart attack, they recommended her to call heart doctor and request d-dimer test

## 2022-02-13 NOTE — Progress Notes (Signed)
Office Visit Note   Patient: Tiffany Horn           Date of Birth: 1960-06-26           MRN: [id number] Visit Date: 02/13/2022              Requested by: [name], DO [address] [place],  [place] [address] PCP: [name], DO   Assessment & Plan: Visit Diagnoses:  1. Chronic left shoulder pain   2. Chronic low back pain, unspecified back pain laterality, unspecified whether sciatica present   3. Rib pain on right side     Plan:  Given patient's shoulder pain and radiographic findings recommend MRI to rule out AVN.  May consider intra-articular injection of the left shoulder if there is no evidence of AVN of the shoulder.  In regards to the thoracic paraspinous pain on the right is definitely could be due to fall resulting in contusion but no fracture ribs.  Recommend continued observation.  Did review her lab work her white count was normal her BUN and creatinine are all within normal range these were recent labs in December 2023. Follow-Up Instructions: Return after MRI.   Orders:  Orders Placed This Encounter  Procedures   XR Shoulder Left   XR Lumbar Spine 2-3 Views   XR Ribs Unilateral Right   No orders of the defined types were placed in this encounter.     Procedures: No procedures performed   Clinical Data: No additional findings.   Subjective: Chief Complaint  Patient presents with   Left Shoulder - Pain   Lower Back - Pain    HPI: Ms. Franzoni 62 year old female well-known to Dr. Delilah Shan service but has not been seen since 2021.  She sustained a comminuted left proximal humerus fracture in 2015 and underwent open reduction internal fixation of this fracture with Dr. Ninfa Linden.  She states she feels the left shoulder is "off".  She also having some mid right back pain.  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  She underwent coiling by Dr.Nundkumar and  stent placement.  Now on Plavix and aspirin.  She states that she has overall general malaise and is off balance causing her to fall frequently.  She states she has had some low back pain for the last 2 months.  However she points to the mid thoracic right paraspinous region as the source of her pain.  States that the "back pain" does awaken her at night.   ROS: There is any bowel or bladder dysfunction.  Denies any hematuria. See HPI otherwise negative or noncontributory.   Objective: Vital Signs: LMP 01/31/2007   Physical Exam Constitutional:      Appearance: She is not ill-appearing or diaphoretic.  Neurological:     Mental Status: She is alert and oriented to person, place, and time.  Psychiatric:        Mood and Affect: Mood normal.        Behavior: Behavior normal.     Ortho Exam Bilateral shoulders 5 out of 5 strength with external and internal rotation against resistance.  She has limited external rotation of the left shoulder.  Has forward flexion of the left shoulder to approximately 170 degrees.  Slight crepitus with passive range of motion left shoulder.  Right shoulder no pain with range of motion.  Left biceps tenderness muscle  belly.  There is no distalization of the muscle belly.  No ecchymosis of the left arm.  Tenderness up into the bicipital groove region left shoulder only. Lower extremities: 5 out of 5 strength throughout negative straight leg raise.  Full forward flexion lumbar spine without pain.  Extension of the lumbar spine patient becomes unstable is unable to perform.  No tenderness over the lumbar spinal column or the thoracic spinal column.  Tenderness over the posterior ribs region of mid thoracic spine.  There is no rashes skin lesions over this area. Specialty Comments:  No specialty comments available.  Imaging: XR Lumbar Spine 2-3 Views  Result Date: 02/13/2022 Lumbar spine 2 views: No acute fracture.  Disc base overall well-maintained.  Mild facet  arthritic changes.  No spondylolisthesis.  Normal lordotic curvature.  XR Shoulder Left  Result Date: 02/13/2022 Left shoulder 3 views: Shoulder is well located.  No hardware failure or backing out of the proximal screws.  The humeral head is irregular compared to prior films which showed a more spherical head.  Cannot rule out AVN.  No acute fractures.  XR Ribs Unilateral Right  Result Date: 02/13/2022 Right rib detail multiple views: No acute fractures.  No bony abnormalities.  No evidence of fracture consolidation of the ribs.    PMFS History: Patient Active Problem List   Diagnosis Date Noted   Intractable headache 01/04/2022   Anxiety 03/07/2021   TIA (transient ischemic attack) 12/17/2020   Moderate persistent asthma 12/17/2020   Hypertension    CVA (cerebral vascular accident) (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) 09/12/2017   Headache, migraine 02/15/2015   Vitamin D deficiency 10/19/2014   Major depressive disorder, recurrent episode, mild ([place]) 09/02/2014   Hyperlipidemia 08/18/2014   Hypothyroidism 08/18/2014   Obesity (BMI 30-39.9) 08/18/2014   Past Medical History:  Diagnosis Date   Anxiety    Arthritis    knees and spine, shoulder   Asthma    Bronchitis    hx - recurrent   Complication of anesthesia    waking up is not easy   Depression    Elevated IgE level 09/12/2017   09/12/2017 IgE 195   History of COVID-19 12/30/2020   Hx of irritable bowel syndrome    x2   Hyperlipidemia    diet controlled - no medication   Hypertension    not taking any meds at present - under control per patient   Hypokalemia    with PNA admission (2.5)   Hypothyroidism (acquired)    Ischemic cerebrovascular accident (CVA) ([place])    Migraines    Pneumonia    4 episodes; hosp. admission 2014   PONV  (postoperative nausea and vomiting)    TIA (transient ischemic attack) 12/17/2020   UC (ulcerative colitis) [facility])    DX'D 2021    Family History  Problem Relation Age of Onset   Breast cancer Mother    Diabetes Mother    Heart disease Mother    Diabetes Father    Dementia Father    Colon cancer Father    Stroke Paternal Grandmother    Diabetes Paternal Grandmother    Colon polyps Neg Hx    Esophageal cancer Neg Hx    Stomach cancer Neg Hx    Rectal cancer Neg Hx     Past Surgical History:  Procedure Laterality Date   BREAST SURGERY     implants, then had them removed   COLONOSCOPY     greater 10 yrs ago - ? [facility]-2017 LAST   DILATION AND CURETTAGE OF UTERUS     IR ANGIO INTRA EXTRACRAN SEL INTERNAL CAROTID BILAT MOD SED  11/19/2020   IR ANGIO VERTEBRAL SEL VERTEBRAL BILAT MOD SED  11/19/2020   IR ANGIO VERTEBRAL SEL VERTEBRAL UNI L MOD SED  02/25/2021   IR ANGIO VERTEBRAL SEL VERTEBRAL UNI L MOD SED  01/13/2022   IR ANGIOGRAM FOLLOW UP STUDY  11/19/2020   IR ANGIOGRAM FOLLOW UP STUDY  11/19/2020   IR ANGIOGRAM FOLLOW UP STUDY  11/19/2020   IR ANGIOGRAM FOLLOW UP STUDY  11/19/2020   IR ANGIOGRAM FOLLOW UP STUDY  11/19/2020   IR ANGIOGRAM FOLLOW UP STUDY  11/19/2020   IR ANGIOGRAM FOLLOW UP STUDY  11/19/2020   IR ANGIOGRAM FOLLOW UP STUDY  11/19/2020   IR INTRA CRAN STENT  11/19/2020   IR TRANSCATH/EMBOLIZ  11/19/2020   LAPAROSCOPIC ABDOMINAL EXPLORATION  01/31/1992   endometriosis   ORIF HUMERUS FRACTURE Left 04/01/2013   DR Ninfa Linden - shoulder   ORIF HUMERUS FRACTURE Left 04/01/2013   Procedure: OPEN REDUCTION INTERNAL FIXATION (ORIF) LEFT PROXIMAL HUMERUS FRACTURE;  Surgeon: Mcarthur Rossetti, MD;  Location: [place];  Service: Orthopedics;  Laterality: Left;   RADIOLOGY WITH ANESTHESIA N/A 11/19/2020   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;  Surgeon: Consuella Lose, MD;  Location: [place];  Service: Radiology;  Laterality: N/A;    TONSILLECTOMY AND ADENOIDECTOMY     Social History   Occupational History   Occupation: Control and instrumentation engineer  Tobacco Use   Smoking status: Never    Passive exposure: Never   Smokeless tobacco: Never  Vaping Use   Vaping Use: Never used  Substance and Sexual Activity   Alcohol use: Yes    Alcohol/week: 1.0 standard drink of alcohol    Types: 1 Glasses of wine per week   Drug use: No   Sexual activity: Yes    Partners: Male    Birth control/protection: Post-menopausal

## 2022-02-13 NOTE — Telephone Encounter (Signed)
Left a message to call back.

## 2022-02-13 NOTE — Telephone Encounter (Signed)
Pt is returning call. Requesting return call.  

## 2022-02-13 NOTE — Telephone Encounter (Signed)
Spoke with pt who reports an episode of chest pain, nausea and diaphoresis on the evening of 02/11/2022.  Pt states she called EMS who came.  She was advised her vital signs were normal and she was not having a heart attack so she decided not to go to the ED.  She was advised to follow up with her cardiologist.  She denies current CP, SOB or dizziness.  She is taking medications as prescribed.  Appointment scheduled with Dr Marlou Porch, DOD on 02/14/2022 at 330pm.  Reviewed ED precautions. Pt verbalizes understanding and agrees with current plan.

## 2022-02-14 ENCOUNTER — Ambulatory Visit (INDEPENDENT_AMBULATORY_CARE_PROVIDER_SITE_OTHER): Payer: BC Managed Care – PPO

## 2022-02-14 ENCOUNTER — Encounter: Payer: Self-pay | Admitting: Cardiology

## 2022-02-14 ENCOUNTER — Other Ambulatory Visit: Payer: Self-pay

## 2022-02-14 ENCOUNTER — Ambulatory Visit: Payer: BC Managed Care – PPO | Attending: Cardiology | Admitting: Cardiology

## 2022-02-14 VITALS — BP 120/82 | HR 77 | Ht 66.0 in | Wt 178.2 lb

## 2022-02-14 DIAGNOSIS — R079 Chest pain, unspecified: Secondary | ICD-10-CM | POA: Diagnosis not present

## 2022-02-14 DIAGNOSIS — G8929 Other chronic pain: Secondary | ICD-10-CM

## 2022-02-14 DIAGNOSIS — R0609 Other forms of dyspnea: Secondary | ICD-10-CM | POA: Diagnosis not present

## 2022-02-14 DIAGNOSIS — R002 Palpitations: Secondary | ICD-10-CM

## 2022-02-14 NOTE — Progress Notes (Signed)
Cardiology Office Note:    Date:  02/14/2022   ID:  Tiffany Horn, DOB 07/08/1960, MRN [id number]  PCP:  [name], DO  [facility] Cardiologist:  Candee Furbish, MD  [facility] Electrophysiologist:  None   Referring MD: [name], DO     History of Present Illness:    Tiffany Horn is a 62 y.o. female here for follow-up.  Has shortness of breath, chest discomfort.  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 Kathyrn Sheriff.  Since then she has had TIA.  She expresses that the coils have compacted.  She has been under close care with neurology.  Has had fatigue, shortness of breath.  Recent echocardiogram was reassuring with normal pump function in December 2023.     Has a strong family history of CAD. 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    Family had stroke and MI in their 39's. Mom died 53, Dad 38.   So tired and SOB.   Lab work was performed, troponin was normal less than 2.  Creatinine 0.7 hemoglobin 13.2.  Ultrasound of abdomen was performed and was normal.  EKG personally reviewed shows sinus rhythm 80 with no other abnormalities.  10/2020: aneurysm, coils, TIAs. 01/13/22 - coils are compacted. Episodes are like hammer to head, vision goes, SOB, nausea, heart palpitations.  Past Saturday, incredible tightness in chest , stood up sweats, dizzy nausea. SOB. So exhausted now since angiogram. 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 Saturday ECG looked good.   Dyspnea up stairs. Rapid beat beat often.   Past Medical History:  Diagnosis Date   Anxiety    Arthritis    knees and spine, shoulder   Asthma    Bronchitis    hx - recurrent   Complication of anesthesia    waking up is not easy   Depression    Elevated IgE level 09/12/2017   09/12/2017 IgE 195   History of COVID-19 12/30/2020   Hx of irritable bowel syndrome    x2    Hyperlipidemia    diet controlled - no medication   Hypertension    not taking any meds at present - under control per patient   Hypokalemia    with PNA admission (2.5)   Hypothyroidism (acquired)    Ischemic cerebrovascular accident (CVA) ([place])    Migraines    Pneumonia    4 episodes; hosp. admission 2014   PONV (postoperative nausea and vomiting)    TIA (transient ischemic attack) 12/17/2020   UC (ulcerative colitis) [facility])    DX'D 2021    Past Surgical History:  Procedure Laterality Date   BREAST SURGERY     implants, then had them removed   COLONOSCOPY     greater 10 yrs ago - ? [facility]-2017 LAST   DILATION AND CURETTAGE OF UTERUS     IR ANGIO INTRA EXTRACRAN SEL INTERNAL CAROTID BILAT MOD SED  11/19/2020   IR ANGIO VERTEBRAL SEL VERTEBRAL BILAT MOD SED  11/19/2020   IR ANGIO VERTEBRAL SEL VERTEBRAL UNI L MOD SED  02/25/2021   IR ANGIO VERTEBRAL SEL VERTEBRAL UNI L MOD SED  01/13/2022   IR ANGIOGRAM FOLLOW UP STUDY  11/19/2020   IR ANGIOGRAM FOLLOW UP STUDY  11/19/2020   IR ANGIOGRAM FOLLOW UP STUDY  11/19/2020   IR ANGIOGRAM FOLLOW UP STUDY  11/19/2020  IR ANGIOGRAM FOLLOW UP STUDY  11/19/2020   IR ANGIOGRAM FOLLOW UP STUDY  11/19/2020   IR ANGIOGRAM FOLLOW UP STUDY  11/19/2020   IR ANGIOGRAM FOLLOW UP STUDY  11/19/2020   IR INTRA CRAN STENT  11/19/2020   IR TRANSCATH/EMBOLIZ  11/19/2020   LAPAROSCOPIC ABDOMINAL EXPLORATION  01/31/1992   endometriosis   ORIF HUMERUS FRACTURE Left 04/01/2013   DR Ninfa Linden - shoulder   ORIF HUMERUS FRACTURE Left 04/01/2013   Procedure: OPEN REDUCTION INTERNAL FIXATION (ORIF) LEFT PROXIMAL HUMERUS FRACTURE;  Surgeon: Mcarthur Rossetti, MD;  Location: [place];  Service: Orthopedics;  Laterality: Left;   RADIOLOGY WITH ANESTHESIA N/A 11/19/2020   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;  Surgeon: Consuella Lose, MD;  Location: [place];  Service: Radiology;  Laterality: N/A;   TONSILLECTOMY AND ADENOIDECTOMY       Current Medications: Current Meds  Medication Sig   alendronate (FOSAMAX) 70 MG tablet Take 1 tablet (70 mg total) by mouth every 7 (seven) days. Take with a full glass of water on an empty stomach. (Patient taking differently: Take 70 mg by mouth every Monday.)   amLODipine (NORVASC) 10 MG tablet Take 1 tablet (10 mg total) by mouth daily.   ASCORBIC ACID PO Take 1 tablet by mouth every evening. Vitamin C, unknown strength.   aspirin EC 81 MG tablet Take 81 mg by mouth daily. Swallow whole.   atorvastatin (LIPITOR) 80 MG tablet Take 1 tablet (80 mg total) by mouth daily.   Budeson-Glycopyrrol-Formoterol (BREZTRI AEROSPHERE) 160-9-4.8 MCG/ACT AERO Inhale 2 puffs into the lungs in the morning and at bedtime. (Patient taking differently: Inhale 2 puffs into the lungs 2 (two) times daily as needed (asthma).)   Cholecalciferol (VITAMIN D-3 PO) Take 1 capsule by mouth in the morning.   clonazePAM (KLONOPIN) 0.5 MG tablet Take 1 tablet (0.5 mg total) by mouth 2 (two) times daily as needed for anxiety.   clopidogrel (PLAVIX) 75 MG tablet Take 1 tablet (75 mg total) by mouth daily.   ezetimibe (ZETIA) 10 MG tablet Take 1 tablet (10 mg total) by mouth daily. (Patient taking differently: Take 10 mg by mouth every evening.)   levothyroxine (SYNTHROID) 125 MCG tablet Take 1 tablet (125 mcg total) by mouth daily. On an empty stomach. (Patient taking differently: Take 125 mcg by mouth in the morning.)   PARoxetine (PAXIL) 40 MG tablet Take 1 tablet (40 mg total) by mouth daily.   rizatriptan (MAXALT) 5 MG tablet Take 1 tablet (5 mg total) by mouth daily as needed for migraine.     Allergies:   Abilify [aripiprazole], Amoxil [amoxicillin], Promethazine hcl, and Seroquel [quetiapine fumarate]   Social History   Socioeconomic History   Marital status: Divorced    Spouse name: Not on file   Number of children: 0   Years of education: Not on file   Highest education level: Bachelor's degree (e.g., BA,  AB, BS)  Occupational History   Occupation: Control and instrumentation engineer  Tobacco Use   Smoking status: Never    Passive exposure: Never   Smokeless tobacco: Never  Vaping Use   Vaping Use: Never used  Substance and Sexual Activity   Alcohol use: Yes    Alcohol/week: 1.0 standard drink of alcohol    Types: 1 Glasses of wine per week   Drug use: No   Sexual activity: Yes    Partners: Male    Birth control/protection: Post-menopausal  Other Topics Concern   Not on file  Social History Narrative   She is originally from [facility]. She has traveled to [place], [place], [place], [place], [place], [place], [place], [place], [place]. No international travel. She has dogs. No prior bird, mold, or recent hot tub exposure. She hasn't used her hot tub in 1.5 years. 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 Previously enjoyed gardening and playing tennis. Helps to care for her mother.   Social Determinants of Health   Financial Resource Strain: Low Risk  (09/07/2021)   Overall Financial Resource Strain (CARDIA)    Difficulty of Paying Living Expenses: Not hard at all  Food Insecurity: No Food Insecurity (01/05/2022)   Hunger Vital Sign    Worried About Running Out of Food in the Last Year: Never true    Ran Out of Food in the Last Year: Never true  Transportation Needs: No Transportation Needs (01/05/2022)   PRAPARE - Hydrologist (Medical): No    Lack of Transportation (Non-Medical): No  Physical Activity: Insufficiently Active (09/07/2021)   Exercise Vital Sign    Days of Exercise per Week: 2 days    Minutes of Exercise per Session: 30 min  Stress: No Stress Concern Present (09/07/2021)   [place]    Feeling of Stress : Only a little  Social Connections: Unknown (09/07/2021)   Social Connection and Isolation Panel [NHANES]    Frequency of Communication with Friends and Family: More than three  times a week    Frequency of Social Gatherings with Friends and Family: Three times a week    Attends Religious Services: 1 to 4 times per year    Active Member of Clubs or Organizations: Not on file    Attends Archivist Meetings: Not on file    Marital Status: Divorced     Family History: The patient's family history includes Breast cancer in her mother; Colon cancer in her father; Dementia in her father; Diabetes in her father, mother, and paternal grandmother; Heart disease in her mother; Stroke in her paternal grandmother. There is no history of Colon polyps, Esophageal cancer, Stomach cancer, or Rectal cancer.  ROS:   Please see the history of present illness.    No fevers chills nausea vomiting syncope bleeding all other systems reviewed and are negative.  EKGs/Labs/Other Studies Reviewed:    The following studies were reviewed today: Coronary CT scan: 03/15/2020: 1. Coronary calcium score of 0. This was 0 percentile for age and sex matched control.   2. Normal coronary origin with right dominance.   3. No evidence of CAD.   4. CAD-RADS 0. No evidence of CAD (0%). Consider non-atherosclerotic causes of chest pain.   Recent Labs: 09/13/2021: TSH 3.37 01/04/2022: ALT 19; BUN 11; Creatinine, Ser 0.70; Hemoglobin 14.6; Platelets 207; Potassium 3.8; Sodium 141  Recent Lipid Panel    Component Value Date/Time   CHOL 241 (H) 01/05/2022 0415   TRIG 156 (H) 01/05/2022 0415   HDL 52 01/05/2022 0415   CHOLHDL 4.6 01/05/2022 0415   VLDL 31 01/05/2022 0415   LDLCALC 158 (H) 01/05/2022 0415   LDLCALC 172 (H) 05/21/2019 1442   LDLDIRECT 165 (H) 09/13/2021 1501     Risk Assessment/Calculations:      Physical Exam:    VS:  BP 120/82   Pulse 77   Ht '5\' 6"'$  (1.676 m)   Wt 178 lb 3.2 oz (80.8  kg)   LMP 01/31/2007   SpO2 98%   BMI 28.76 kg/m     Wt Readings from Last 3 Encounters:  02/14/22 178 lb 3.2 oz (80.8 kg)  01/27/22 170 lb (77.1 kg)  01/13/22 170 lb  (77.1 kg)     GEN:  Well nourished, well developed in no acute distress HEENT: Normal NECK: No JVD; No carotid bruits LYMPHATICS: No lymphadenopathy CARDIAC: RRR, no murmurs, rubs, gallops RESPIRATORY:  Clear to auscultation without rales, wheezing or rhonchi  ABDOMEN: Soft, non-tender, non-distended MUSCULOSKELETAL:  No edema; No deformity  SKIN: Warm and dry NEUROLOGIC:  Alert and oriented x 3 PSYCHIATRIC:  Normal affect   ASSESSMENT:    1. Dyspnea on exertion   2. Palpitations   3. Chest pain of uncertain etiology     PLAN:    In order of problems listed above:  Chest pain -Previous 03/15/2020 coronary CT scan was reassuring as above.  Possible other etiologies for her chest discomfort include musculoskeletal or perhaps GI.  Recent EKG was reassuring.   -She does have family history of stroke/MI, hyperlipidemia. -Gallbladder ultrasound was normal.  Reassuring.  Certainly other etiologies for chest pain include musculoskeletal, GERD.  Shortness of breath - Echocardiogram was recently performed on 01/04/2022 and showed the following:  1. Left ventricular ejection fraction, by estimation, is 55 to 60%. The  left ventricle has normal function. The left ventricle has no regional  wall motion abnormalities. Left ventricular diastolic parameters are  consistent with Grade I diastolic  dysfunction (impaired relaxation).   2. Right ventricular systolic function is normal. The right ventricular  size is normal. Tricuspid regurgitation signal is inadequate for assessing  PA pressure.   3. The mitral valve is normal in structure. No evidence of mitral valve  regurgitation. No evidence of mitral stenosis.   4. The aortic valve is tricuspid. Aortic valve regurgitation is not  visualized. No aortic stenosis is present.   5. The inferior vena cava is normal in size with greater than 50%  respiratory variability, suggesting right atrial pressure of 3 mmHg.   Assuring pump function as  above.  Hyperlipidemia - Continue with atorvastatin 80 mg.  Also on Plavix 75 mg Zetia 10 mg and aspirin 81 mg given her prior TIAs.  LDL goal less than 55  Aortic atherosclerosis -Mild aortic plaque noted in the distal aorta 10/19/2019 CT abdomen pelvis.  Agree with atorvastatin.  Basilar tip aneurysm/TIA - Status post coiling, Dr. Kathyrn Sheriff.  Palpitations - Checking a Zio patch monitor.  Will be helpful to make sure that she does not have any evidence of atrial fibrillation.  Anxiety -Takes an occasional Klonopin.  Dr. Raoul Pitch.  Paxil.   1 year follow-up prevention  Medication Adjustments/Labs and Tests Ordered: Current medicines are reviewed at length with the patient today.  Concerns regarding medicines are outlined above.  Orders Placed This Encounter  Procedures   LONG TERM MONITOR (3-14 DAYS)   No orders of the defined types were placed in this encounter.   Patient Instructions  Medication Instructions:  The current medical regimen is effective;  continue present plan and medications.  *If you need a refill on your cardiac medications before your next appointment, please call your pharmacy*  Testing/Procedures: [place] Monitor Instructions  Your physician has requested you wear a ZIO patch monitor for 14 days.  This is a single patch monitor. [employer] supplies one patch monitor per enrollment. Additional stickers are not available. Please do not apply  patch if you will be having a Nuclear Stress Test,  Echocardiogram, Cardiac CT, MRI, or Chest Xray during the period you would be wearing the  monitor. The patch cannot be worn during these tests. You cannot remove and re-apply the  ZIO XT patch monitor.  Your ZIO patch monitor will be mailed 3 day USPS to your address on file. It may take 3-5 days  to receive your monitor after you have been enrolled.  Once you have received your monitor, please review the enclosed instructions. Your monitor  has already been  registered assigning a specific monitor serial # to you.  Billing and Patient Assistance Program Information  We have supplied [employer] with any of your insurance information on file for billing purposes. [employer] offers a sliding scale Patient Assistance Program for patients that do not have  insurance, or whose insurance does not completely cover the cost of the ZIO monitor.  You must apply for the Patient Assistance Program to qualify for this discounted rate.  To apply, please call [employer] at [phone], select option 4, select option 2, ask to apply for  Patient Assistance Program. Theodore Demark will ask your household income, and how many people  are in your household. They will quote your out-of-pocket cost based on that information.  [employer] will also be able to set up a 28-month interest-free payment plan if needed.  Applying the monitor   Shave hair from upper left chest.  Hold abrader disc by orange tab. Rub abrader in 40 strokes over the upper left chest as  indicated in your monitor instructions.  Clean area with 4 enclosed alcohol pads. Let dry.  Apply patch as indicated in monitor instructions. Patch will be placed under collarbone on left  side of chest with arrow pointing upward.  Rub patch adhesive wings for 2 minutes. Remove white label marked "1". Remove the white  label marked "2". Rub patch adhesive wings for 2 additional minutes.  While looking in a mirror, press and release button in center of patch. A small green light will  flash 3-4 times. This will be your only indicator that the monitor has been turned on.  Do not shower for the first 24 hours. You may shower after the first 24 hours.  Press the button if you feel a symptom. You will hear a small click. Record Date, Time and  Symptom in the Patient Logbook.  When you are ready to remove the patch, follow instructions on the last 2 pages of Patient  Logbook. Stick patch monitor onto the last page of Patient  Logbook.  Place Patient Logbook in the blue and white box. Use locking tab on box and tape box closed  securely. The blue and white box has prepaid postage on it. Please place it in the mailbox as  soon as possible. Your physician should have your test results approximately 7 days after the  monitor has been mailed back to [facility]  Call IH[place]at [phone] you have questions regarding  your ZIO XT patch monitor. Call them immediately if you see an orange light blinking on your  monitor.  If your monitor falls off in less than 4 days, contact our Monitor department at [phone]  If your monitor becomes loose or falls off after 4 days call [employer] at [phone]  suggestions on securing your monitor    Follow-Up: [facility] you and your health needs are our priority.  As part of our continuing  mission to provide you with exceptional heart care, we have created designated Provider Care Teams.  These Care Teams include your primary Cardiologist (physician) and Advanced Practice Providers (APPs -  Physician Assistants and Nurse Practitioners) who all work together to provide you with the care you need, when you need it.  We recommend signing up for the patient portal called "MyChart".  Sign up information is provided on this After Visit Summary.  MyChart is used to connect with patients for Virtual Visits (Telemedicine).  Patients are able to view lab/test results, encounter notes, upcoming appointments, etc.  Non-urgent messages can be sent to your provider as well.   To learn more about what you can do with MyChart, go to [email or web address].    Your next appointment:   Follow up will be based on the results of the above testing.    Signed, Candee Furbish, MD  02/14/2022 4:14 PM     [facility]

## 2022-02-14 NOTE — Progress Notes (Unsigned)
Enrolled for [employer] to mail a ZIO XT long term holter monitor to the patients address on file.  

## 2022-02-14 NOTE — Patient Instructions (Signed)
Medication Instructions:  The current medical regimen is effective;  continue present plan and medications.  *If you need a refill on your cardiac medications before your next appointment, please call your pharmacy*  Testing/Procedures: [place] Monitor Instructions  Your physician has requested you wear a ZIO patch monitor for 14 days.  This is a single patch monitor. [employer] supplies one patch monitor per enrollment. Additional stickers are not available. Please do not apply patch if you will be having a Nuclear Stress Test,  Echocardiogram, Cardiac CT, MRI, or Chest Xray during the period you would be wearing the  monitor. The patch cannot be worn during these tests. You cannot remove and re-apply the  ZIO XT patch monitor.  Your ZIO patch monitor will be mailed 3 day USPS to your address on file. It may take 3-5 days  to receive your monitor after you have been enrolled.  Once you have received your monitor, please review the enclosed instructions. Your monitor  has already been registered assigning a specific monitor serial # to you.  Billing and Patient Assistance Program Information  We have supplied [employer] with any of your insurance information on file for billing purposes. [employer] offers a sliding scale Patient Assistance Program for patients that do not have  insurance, or whose insurance does not completely cover the cost of the ZIO monitor.  You must apply for the Patient Assistance Program to qualify for this discounted rate.  To apply, please call [employer] at [phone], select option 4, select option 2, ask to apply for  Patient Assistance Program. Theodore Demark will ask your household income, and how many people  are in your household. They will quote your out-of-pocket cost based on that information.  [employer] will also be able to set up a 24-month interest-free payment plan if needed.  Applying the monitor   Shave hair from upper left chest.  Hold abrader disc  by orange tab. Rub abrader in 40 strokes over the upper left chest as  indicated in your monitor instructions.  Clean area with 4 enclosed alcohol pads. Let dry.  Apply patch as indicated in monitor instructions. Patch will be placed under collarbone on left  side of chest with arrow pointing upward.  Rub patch adhesive wings for 2 minutes. Remove white label marked "1". Remove the white  label marked "2". Rub patch adhesive wings for 2 additional minutes.  While looking in a mirror, press and release button in center of patch. A small green light will  flash 3-4 times. This will be your only indicator that the monitor has been turned on.  Do not shower for the first 24 hours. You may shower after the first 24 hours.  Press the button if you feel a symptom. You will hear a small click. Record Date, Time and  Symptom in the Patient Logbook.  When you are ready to remove the patch, follow instructions on the last 2 pages of Patient  Logbook. Stick patch monitor onto the last page of Patient Logbook.  Place Patient Logbook in the blue and white box. Use locking tab on box and tape box closed  securely. The blue and white box has prepaid postage on it. Please place it in the mailbox as  soon as possible. Your physician should have your test results approximately 7 days after the  monitor has been mailed back to [facility]  Call I[place]at [phone] you have questions regarding  your ZIO XT patch  monitor. Call them immediately if you see an orange light blinking on your  monitor.  If your monitor falls off in less than 4 days, contact our Monitor department at [phone].  If your monitor becomes loose or falls off after 4 days call [employer] at [phone] for  suggestions on securing your monitor    Follow-Up: At [facility], you and your health needs are our priority.  As part of our continuing mission to provide you with exceptional heart care,  we have created designated Provider Care Teams.  These Care Teams include your primary Cardiologist (physician) and Advanced Practice Providers (APPs -  Physician Assistants and Nurse Practitioners) who all work together to provide you with the care you need, when you need it.  We recommend signing up for the patient portal called "MyChart".  Sign up information is provided on this After Visit Summary.  MyChart is used to connect with patients for Virtual Visits (Telemedicine).  Patients are able to view lab/test results, encounter notes, upcoming appointments, etc.  Non-urgent messages can be sent to your provider as well.   To learn more about what you can do with MyChart, go to [email or web address].    Your next appointment:   Follow up will be based on the results of the above testing.

## 2022-02-21 DIAGNOSIS — R002 Palpitations: Secondary | ICD-10-CM

## 2022-02-26 NOTE — Progress Notes (Deleted)
Office Visit    Patient Name: Tiffany Horn Date of Encounter: 02/26/2022  PCP:  [name] DO   [place]  Cardiologist:  Candee Furbish, MD  Advanced Practice Provider:  No care team member to display Electrophysiologist:  None   HPI    Tiffany Horn is a 62 y.o. female with a past medical history shortness of breath, chest discomfort (cardiac evaluation 2022 which demonstrated coronary calcium score of 0), TIA, basilar tip aneurysm, fatigue (echocardiogram reassuring with normal pump function 12/2021) presents today for follow-up appointment.  Strong family history of CAD.  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  Family had stroke and MI in their 9s.  Mom died at 38 and died at 87.  She is having some fatigue and shortness of breath.  Labs were performed which showed troponin normal less than 2.  Creatinine 0.7.  Hemoglobin 13.2.  Ultrasound of the abdomen was performed and was normal.  EKG showed normal sinus rhythm, rate 80 bpm with no other abnormalities.  10/2020 with aneurysm, coils, TIAs.  01/13/2022 coils are compacted.  Episodes are like hemorrhage to head, vision goes, SOB, nausea, heart palpitations.  The Sunday prior had incredible tightness in her chest when she stood up she sweats, dizziness/nausea, shortness of breath.  Exhausted since angiogram.  EKG looked okay at that time.  Today, she***  Past Medical History    Past Medical History:  Diagnosis Date   Anxiety    Arthritis    knees and spine, shoulder   Asthma    Bronchitis    hx - recurrent   Complication of anesthesia    waking up is not easy   Depression    Elevated IgE level 09/12/2017   09/12/2017 IgE 195   History of COVID-19 12/30/2020   Hx of irritable bowel syndrome    x2   Hyperlipidemia    diet controlled - no medication   Hypertension    not taking any meds at present - under control per patient   Hypokalemia    with PNA admission (2.5)   Hypothyroidism  (acquired)    Ischemic cerebrovascular accident (CVA) ([place])    Migraines    Pneumonia    4 episodes; hosp. admission 2014   PONV (postoperative nausea and vomiting)    TIA (transient ischemic attack) 12/17/2020   UC (ulcerative colitis) [facility])    DX'D 2021   Past Surgical History:  Procedure Laterality Date   BREAST SURGERY     implants, then had them removed   COLONOSCOPY     greater 10 yrs ago - ? [facility]-2017 LAST   DILATION AND CURETTAGE OF UTERUS     IR ANGIO INTRA EXTRACRAN SEL INTERNAL CAROTID BILAT MOD SED  11/19/2020   IR ANGIO VERTEBRAL SEL VERTEBRAL BILAT MOD SED  11/19/2020   IR ANGIO VERTEBRAL SEL VERTEBRAL UNI L MOD SED  02/25/2021   IR ANGIO VERTEBRAL SEL VERTEBRAL UNI L MOD SED  01/13/2022   IR ANGIOGRAM FOLLOW UP STUDY  11/19/2020   IR ANGIOGRAM FOLLOW UP STUDY  11/19/2020   IR ANGIOGRAM FOLLOW UP STUDY  11/19/2020   IR ANGIOGRAM FOLLOW UP STUDY  11/19/2020   IR ANGIOGRAM FOLLOW UP STUDY  11/19/2020   IR ANGIOGRAM FOLLOW UP STUDY  11/19/2020   IR ANGIOGRAM FOLLOW UP STUDY  11/19/2020   IR ANGIOGRAM FOLLOW UP STUDY  11/19/2020   IR INTRA CRAN STENT  11/19/2020   IR TRANSCATH/EMBOLIZ  11/19/2020   LAPAROSCOPIC ABDOMINAL EXPLORATION  01/31/1992   endometriosis   ORIF HUMERUS FRACTURE Left 04/01/2013   DR Ninfa Linden - shoulder   ORIF HUMERUS FRACTURE Left 04/01/2013   Procedure: OPEN REDUCTION INTERNAL FIXATION (ORIF) LEFT PROXIMAL HUMERUS FRACTURE;  Surgeon: Mcarthur Rossetti, MD;  Location: [facility];  Service: Orthopedics;  Laterality: Left;   RADIOLOGY WITH ANESTHESIA N/A 11/19/2020   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;  Surgeon: Consuella Lose, MD;  Location: [place];  Service: Radiology;  Laterality: N/A;   TONSILLECTOMY AND ADENOIDECTOMY      Allergies  Allergies  Allergen Reactions   Abilify [Aripiprazole] Nausea And Vomiting   Amoxil [Amoxicillin] Diarrhea   Promethazine Hcl Other (See Comments)    Made pt feel  "weird"   Seroquel [Quetiapine Fumarate] Other (See Comments)    Pt does not recall reaction   EKGs/Labs/Other Studies Reviewed:   The following studies were reviewed today:    Echocardiogram was recently performed on 01/04/2022 and showed the following:  1. Left ventricular ejection fraction, by estimation, is 55 to 60%. The  left ventricle has normal function. The left ventricle has no regional  wall motion abnormalities. Left ventricular diastolic parameters are  consistent with Grade I diastolic  dysfunction (impaired relaxation).   2. Right ventricular systolic function is normal. The right ventricular  size is normal. Tricuspid regurgitation signal is inadequate for assessing  PA pressure.   3. The mitral valve is normal in structure. No evidence of mitral valve  regurgitation. No evidence of mitral stenosis.   4. The aortic valve is tricuspid. Aortic valve regurgitation is not  visualized. No aortic stenosis is present.   5. The inferior vena cava is normal in size with greater than 50%  respiratory variability, suggesting right atrial pressure of 3 mmHg.   EKG:  EKG is *** ordered today.  The ekg ordered today demonstrates ***  Recent Labs: 09/13/2021: TSH 3.37 01/04/2022: ALT 19; BUN 11; Creatinine, Ser 0.70; Hemoglobin 14.6; Platelets 207; Potassium 3.8; Sodium 141  Recent Lipid Panel    Component Value Date/Time   CHOL 241 (H) 01/05/2022 0415   TRIG 156 (H) 01/05/2022 0415   HDL 52 01/05/2022 0415   CHOLHDL 4.6 01/05/2022 0415   VLDL 31 01/05/2022 0415   LDLCALC 158 (H) 01/05/2022 0415   LDLCALC 172 (H) 05/21/2019 1442   LDLDIRECT 165 (H) 09/13/2021 1501    Risk Assessment/Calculations:  {Does this patient have ATRIAL FIBRILLATION?:[phone]}  Home Medications   No outpatient medications have been marked as taking for the 02/27/22 encounter (Appointment) with Elgie Collard, PA-C.     Review of Systems   ***   All other systems reviewed and are otherwise  negative except as noted above.  Physical Exam    VS:  LMP 01/31/2007  , BMI There is no height or weight on file to calculate BMI.  Wt Readings from Last 3 Encounters:  02/14/22 178 lb 3.2 oz (80.8 kg)  01/27/22 170 lb (77.1 kg)  01/13/22 170 lb (77.1 kg)     GEN: Well nourished, well developed, in no acute distress. HEENT: normal. Neck: Supple, no JVD, carotid bruits, or masses. Cardiac: ***RRR, no murmurs, rubs, or gallops. No clubbing, cyanosis, edema.  ***Radials/PT 2+ and equal bilaterally.  Respiratory:  ***Respirations regular and unlabored, clear to auscultation bilaterally. GI: Soft, nontender, nondistended. MS: No deformity or atrophy. Skin: Warm and dry, no rash. Neuro:  Strength and sensation are intact. Psych: Normal affect.  Assessment & Plan    Chest pain Shortness of breath Palpitations Hyperlipidemia Aortic atherosclerosis Basilar tip aneurysm/TIA Anxiety  No BP recorded.  {Refresh Note OR Click here to enter BP  :1}***      Disposition: Follow up {follow up:15908} with Candee Furbish, MD or APP.  Signed, Elgie Collard, PA-C 02/26/2022, 6:26 PM  [facility]

## 2022-02-27 ENCOUNTER — Ambulatory Visit: Payer: BC Managed Care – PPO | Admitting: Physician Assistant

## 2022-02-27 DIAGNOSIS — R002 Palpitations: Secondary | ICD-10-CM

## 2022-02-27 DIAGNOSIS — R079 Chest pain, unspecified: Secondary | ICD-10-CM

## 2022-02-27 DIAGNOSIS — R0609 Other forms of dyspnea: Secondary | ICD-10-CM

## 2022-02-27 DIAGNOSIS — F419 Anxiety disorder, unspecified: Secondary | ICD-10-CM

## 2022-02-27 DIAGNOSIS — I639 Cerebral infarction, unspecified: Secondary | ICD-10-CM

## 2022-02-27 DIAGNOSIS — R072 Precordial pain: Secondary | ICD-10-CM

## 2022-02-27 DIAGNOSIS — E7800 Pure hypercholesterolemia, unspecified: Secondary | ICD-10-CM

## 2022-03-06 ENCOUNTER — Encounter: Payer: Self-pay | Admitting: Physician Assistant

## 2022-03-06 ENCOUNTER — Ambulatory Visit: Payer: BC Managed Care – PPO | Admitting: Physician Assistant

## 2022-03-06 ENCOUNTER — Telehealth: Payer: Self-pay

## 2022-03-06 VITALS — BP 122/86 | HR 82 | Ht 66.0 in

## 2022-03-06 DIAGNOSIS — Z7901 Long term (current) use of anticoagulants: Secondary | ICD-10-CM | POA: Diagnosis not present

## 2022-03-06 DIAGNOSIS — R140 Abdominal distension (gaseous): Secondary | ICD-10-CM

## 2022-03-06 DIAGNOSIS — Z8673 Personal history of transient ischemic attack (TIA), and cerebral infarction without residual deficits: Secondary | ICD-10-CM

## 2022-03-06 DIAGNOSIS — Z800 Family history of malignant neoplasm of digestive organs: Secondary | ICD-10-CM | POA: Diagnosis not present

## 2022-03-06 DIAGNOSIS — Z86010 Personal history of colonic polyps: Secondary | ICD-10-CM | POA: Diagnosis not present

## 2022-03-06 MED ORDER — NA SULFATE-K SULFATE-MG SULF 17.5-3.13-1.6 GM/177ML PO SOLN: ORAL | 0 refills | Status: DC

## 2022-03-06 NOTE — Telephone Encounter (Signed)
[facility] Pre-operative Risk Assessment     Request for surgical clearance:     Endoscopy Procedure  What type of surgery is being performed?     Colonoscopy  When is this surgery scheduled?     03/28/22  What type of clearance is required ?   Pharmacy  Are there any medications that need to be held prior to surgery and how long? Plavix hold for 5 days  Practice name and name of physician performing surgery?      [facility]  What is your office phone and fax number?      Phone- [phone]  [phone]  Anesthesia type (None, local, MAC, general) ?       MAC

## 2022-03-06 NOTE — Telephone Encounter (Signed)
[facility] Pre-operative Risk Assessment     Request for surgical clearance:     Endoscopy Procedure  What type of surgery is being performed?     Colonoscopy   When is this surgery scheduled?     03/28/22  What type of clearance is required ?   Pharmacy  Are there any medications that need to be held prior to surgery and how long? Plavix hold for 5 days  Practice name and name of physician performing surgery?      [facility]  What is your office phone and fax number?      Phone- [phone]  [phone]  Anesthesia type (None, local, MAC, general) ?       MAC

## 2022-03-06 NOTE — Telephone Encounter (Signed)
   Patient Name: Tiffany Horn  DOB: 03-16-1960 MRN: [id number]  Primary Cardiologist: Candee Furbish, MD  Chart reviewed as part of pre-operative protocol coverage.   Patient takes Plavix for a history of TIA.  Therefore, recommendations for holding Plavix prior to colonoscopy should come from managing provider (neurology).  I will route this recommendation to the requesting party via Epic fax function and remove from pre-op pool.  Please call with questions.  Lenna Sciara, NP 03/06/2022, 2:38 PM

## 2022-03-06 NOTE — Progress Notes (Signed)
Chief Complaint: Discuss colonoscopy and the patient on chronic anticoagulation  HPI:    Tiffany Horn is a 62 year old female with a past medical history as listed below including anxiety, depression, ulcerative colitis and previous TIA on Plavix (echo 01/04/2022 with LVEF 55-60%), known to Dr. Silverio Decamp, who presents to clinic today to discuss a recall colonoscopy.    09/20/2020 colonoscopy for family history of a first-degree relative with colorectal cancer with two 4-11 mm polyps removed from the ascending colon and cecum and a 20 mm polyp at hepatic flexure removed piecemeal also patchy mild inflammation in the entire examined colon secondary to colitis and nonbleeding external and internal hemorrhoids.  Repeat colonoscopy is recommended in 1 year given piecemeal polypectomy.  Pathology showed sessile serrated polyps.    02/14/2022 patient seen by cardiology for shortness of breath and chest discomfort.  Discussed her recent ultrasound which was normal.  Explained that she could be having chest pain from musculoskeletal or GERD symptoms.    Today, the patient presents to clinic and tells me she knows she is due for another colonoscopy.  She asked exactly why.  Does tell me that over the past month and a half or so she has been a little bit more gassy and bloated and has not changed her diet.  Also going to the bathroom more often throughout the day but it is still solid.  No real pain.  No changes in medications.  Tells me she has a holistic friend who recommended she do a bowel cleanse.  She asked me thoughts on that.    Patient is going to [place] with a group of friends for their 60th birthdays in May.    Denies fever, chills, weight loss or blood in her stool.  Past Medical History:  Diagnosis Date   Anxiety    Arthritis    knees and spine, shoulder   Asthma    Bronchitis    hx - recurrent   Complication of anesthesia    waking up is not easy   Depression    Elevated IgE level  09/12/2017   09/12/2017 IgE 195   History of COVID-19 12/30/2020   Hx of irritable bowel syndrome    x2   Hyperlipidemia    diet controlled - no medication   Hypertension    not taking any meds at present - under control per patient   Hypokalemia    with PNA admission (2.5)   Hypothyroidism (acquired)    Ischemic cerebrovascular accident (CVA) ([place])    Migraines    Pneumonia    4 episodes; hosp. admission 2014   PONV (postoperative nausea and vomiting)    TIA (transient ischemic attack) 12/17/2020   UC (ulcerative colitis) [facility])    DX'D 2021    Past Surgical History:  Procedure Laterality Date   BREAST SURGERY     implants, then had them removed   COLONOSCOPY     greater 10 yrs ago - ? [facility]-2017 LAST   DILATION AND CURETTAGE OF UTERUS     IR ANGIO INTRA EXTRACRAN SEL INTERNAL CAROTID BILAT MOD SED  11/19/2020   IR ANGIO VERTEBRAL SEL VERTEBRAL BILAT MOD SED  11/19/2020   IR ANGIO VERTEBRAL SEL VERTEBRAL UNI L MOD SED  02/25/2021   IR ANGIO VERTEBRAL SEL VERTEBRAL UNI L MOD SED  01/13/2022   IR ANGIOGRAM FOLLOW UP STUDY  11/19/2020   IR ANGIOGRAM FOLLOW UP STUDY  11/19/2020   IR ANGIOGRAM FOLLOW UP STUDY  11/19/2020   IR ANGIOGRAM FOLLOW UP STUDY  11/19/2020   IR ANGIOGRAM FOLLOW UP STUDY  11/19/2020   IR ANGIOGRAM FOLLOW UP STUDY  11/19/2020   IR ANGIOGRAM FOLLOW UP STUDY  11/19/2020   IR ANGIOGRAM FOLLOW UP STUDY  11/19/2020   IR INTRA CRAN STENT  11/19/2020   IR TRANSCATH/EMBOLIZ  11/19/2020   LAPAROSCOPIC ABDOMINAL EXPLORATION  01/31/1992   endometriosis   ORIF HUMERUS FRACTURE Left 04/01/2013   DR Ninfa Linden - shoulder   ORIF HUMERUS FRACTURE Left 04/01/2013   Procedure: OPEN REDUCTION INTERNAL FIXATION (ORIF) LEFT PROXIMAL HUMERUS FRACTURE;  Surgeon: Mcarthur Rossetti, MD;  Location: [facility];  Service: Orthopedics;  Laterality: Left;   RADIOLOGY WITH ANESTHESIA N/A 11/19/2020   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;  Surgeon:  Consuella Lose, MD;  Location: [facility];  Service: Radiology;  Laterality: N/A;   TONSILLECTOMY AND ADENOIDECTOMY      Current Outpatient Medications  Medication Sig Dispense Refill   alendronate (FOSAMAX) 70 MG tablet Take 1 tablet (70 mg total) by mouth every 7 (seven) days. Take with a full glass of water on an empty stomach. (Patient taking differently: Take 70 mg by mouth every Monday.) 4 tablet 11   amLODipine (NORVASC) 10 MG tablet Take 1 tablet (10 mg total) by mouth daily. 90 tablet 1   ASCORBIC ACID PO Take 1 tablet by mouth every evening. Vitamin C, unknown strength.     aspirin EC 81 MG tablet Take 81 mg by mouth daily. Swallow whole.     atorvastatin (LIPITOR) 80 MG tablet Take 1 tablet (80 mg total) by mouth daily. 90 tablet 1   Budeson-Glycopyrrol-Formoterol (BREZTRI AEROSPHERE) 160-9-4.8 MCG/ACT AERO Inhale 2 puffs into the lungs in the morning and at bedtime. (Patient taking differently: Inhale 2 puffs into the lungs 2 (two) times daily as needed (asthma).) 5.9 g 1   Cholecalciferol (VITAMIN D-3 PO) Take 1 capsule by mouth in the morning.     clonazePAM (KLONOPIN) 0.5 MG tablet Take 1 tablet (0.5 mg total) by mouth 2 (two) times daily as needed for anxiety. 180 tablet 1   clopidogrel (PLAVIX) 75 MG tablet Take 1 tablet (75 mg total) by mouth daily. 30 tablet 1   ezetimibe (ZETIA) 10 MG tablet Take 1 tablet (10 mg total) by mouth daily. (Patient taking differently: Take 10 mg by mouth every evening.) 90 tablet 3   levothyroxine (SYNTHROID) 125 MCG tablet Take 1 tablet (125 mcg total) by mouth daily. On an empty stomach. (Patient taking differently: Take 125 mcg by mouth in the morning.) 90 tablet 3   PARoxetine (PAXIL) 40 MG tablet Take 1 tablet (40 mg total) by mouth daily. 90 tablet 1   rizatriptan (MAXALT) 5 MG tablet Take 1 tablet (5 mg total) by mouth daily as needed for migraine. 10 tablet 5   No current facility-administered medications for this visit.    Allergies as  of 03/06/2022 - Review Complete 03/06/2022  Allergen Reaction Noted   Abilify [aripiprazole] Nausea And Vomiting 09/03/2019   Amoxil [amoxicillin] Diarrhea 06/11/2015   Promethazine hcl Other (See Comments) 12/29/2020   Seroquel [quetiapine fumarate] Other (See Comments) 09/29/2016    Family History  Problem Relation Age of Onset   Breast cancer Mother    Diabetes Mother    Heart disease Mother    Diabetes Father    Dementia Father    Colon cancer Father    Stroke Paternal Grandmother    Diabetes Paternal Grandmother  Colon polyps Neg Hx    Esophageal cancer Neg Hx    Stomach cancer Neg Hx    Rectal cancer Neg Hx     Social History   Socioeconomic History   Marital status: Divorced    Spouse name: Not on file   Number of children: 0   Years of education: Not on file   Highest education level: Bachelor's degree (e.g., BA, AB, BS)  Occupational History   Occupation: Control and instrumentation engineer  Tobacco Use   Smoking status: Never    Passive exposure: Never   Smokeless tobacco: Never  Vaping Use   Vaping Use: Never used  Substance and Sexual Activity   Alcohol use: Yes    Alcohol/week: 1.0 standard drink of alcohol    Types: 1 Glasses of wine per week   Drug use: No   Sexual activity: Yes    Partners: Male    Birth control/protection: Post-menopausal  Other Topics Concern   Not on file  Social History Narrative   She is originally from [place]. She has traveled to [facility], [place], [place], [place], [place], [place], [place], [place], [place]. No international travel. She has dogs. No prior bird, mold, or recent hot tub exposure. She hasn't used her hot tub in 1.5 years. 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 Previously enjoyed gardening and playing tennis. Helps to care for her mother.   Social Determinants of Health   Financial Resource Strain: Low Risk  (09/07/2021)   Overall Financial Resource Strain (CARDIA)    Difficulty of Paying Living  Expenses: Not hard at all  Food Insecurity: No Food Insecurity (01/05/2022)   Hunger Vital Sign    Worried About Running Out of Food in the Last Year: Never true    Ran Out of Food in the Last Year: Never true  Transportation Needs: No Transportation Needs (01/05/2022)   PRAPARE - Hydrologist (Medical): No    Lack of Transportation (Non-Medical): No  Physical Activity: Insufficiently Active (09/07/2021)   Exercise Vital Sign    Days of Exercise per Week: 2 days    Minutes of Exercise per Session: 30 min  Stress: No Stress Concern Present (09/07/2021)   [place]    Feeling of Stress : Only a little  Social Connections: Unknown (09/07/2021)   Social Connection and Isolation Panel [NHANES]    Frequency of Communication with Friends and Family: More than three times a week    Frequency of Social Gatherings with Friends and Family: Three times a week    Attends Religious Services: 1 to 4 times per year    Active Member of Clubs or Organizations: Not on file    Attends Archivist Meetings: Not on file    Marital Status: Divorced  Intimate Partner Violence: Not At Risk (01/05/2022)   Humiliation, Afraid, Rape, and Kick questionnaire    Fear of Current or Ex-Partner: No    Emotionally Abused: No    Physically Abused: No    Sexually Abused: No    Review of Systems:    Constitutional: No weight loss, fever or chills Cardiovascular: No chest pain Respiratory: No SOB Gastrointestinal: See HPI and otherwise negative   Physical Exam:  Vital signs: BP 122/86   Pulse 82   Ht '5\' 6"'$  (1.676 m)   LMP 01/31/2007   BMI 28.76 kg/m    Constitutional:  Pleasant Caucasian female appears to be in NAD, Well developed, Well nourished, alert and cooperative Respiratory: Respirations even and unlabored. Lungs clear to auscultation bilaterally.   No wheezes, crackles, or rhonchi.  Cardiovascular:  Normal S1, S2. No MRG. Regular rate and rhythm. No peripheral edema, cyanosis or pallor.  Gastrointestinal:  Soft, nondistended, nontender. No rebound or guarding. Normal bowel sounds. No appreciable masses or hepatomegaly. Rectal:  Not performed.  Psychiatric: Demonstrates good judgement and reason without abnormal affect or behaviors.  RELEVANT LABS AND IMAGING: CBC    Component Value Date/Time   WBC 5.0 01/04/2022 0947   RBC 4.69 01/04/2022 0947   HGB 14.6 01/04/2022 0958   HCT 43.0 01/04/2022 0958   PLT 207 01/04/2022 0947   MCV 90.8 01/04/2022 0947   MCV 91.9 03/14/2013 1042   MCH 31.3 01/04/2022 0947   MCHC 34.5 01/04/2022 0947   RDW 12.7 01/04/2022 0947   LYMPHSABS 1.8 01/04/2022 0947   MONOABS 0.4 01/04/2022 0947   EOSABS 0.2 01/04/2022 0947   BASOSABS 0.1 01/04/2022 0947    CMP     Component Value Date/Time   NA 141 01/04/2022 0958   K 3.8 01/04/2022 0958   CL 106 01/04/2022 0958   CO2 25 01/04/2022 0947   GLUCOSE 107 (H) 01/04/2022 0958   BUN 11 01/04/2022 0958   CREATININE 0.70 01/04/2022 0958   CREATININE 0.92 06/06/2019 1435   CALCIUM 9.1 01/04/2022 0947   PROT 6.9 01/04/2022 0947   ALBUMIN 3.9 01/04/2022 0947   AST 19 01/04/2022 0947   ALT 19 01/04/2022 0947   ALKPHOS 54 01/04/2022 0947   BILITOT 0.5 01/04/2022 0947   GFRNONAA >60 01/04/2022 0947   GFRNONAA 76 09/29/2016 1537   GFRAA >60 10/19/2019 1208   GFRAA 87 09/29/2016 1537    Assessment: 1.  History of sessile serrated polyp: 20 mm sessile polyp removed piecemeal at the end of 2022 with recommendations to repeat colonoscopy in a year, patient is overdue now 2.  Bloating and gas: Worse over the past month and a half; consider SIBO versus diet versus meds versus other 3.  History of TIA: On Plavix  Plan: 1.  Scheduled patient for [facility] repeat [facility]  Did provide the patient a detailed list of risks for the procedure and she agrees to proceed. 2.   Patient advised to hold her Plavix for 5 days prior to time of procedure.  We will communicate with her prescribing physician to ensure this is acceptable. 3.  Briefly discussed gas and bloating.  If she still feels this way after time of colonoscopy could consider SIBO testing. 4.  Patient to follow in clinic per recommendations from Dr. Silverio Decamp after time of procedure.  Ellouise Newer, PA-C [facility] 03/06/2022, 1:54 PM  Cc: Howard Pouch A, DO

## 2022-03-06 NOTE — Patient Instructions (Addendum)
You have been scheduled for a colonoscopy. Please follow written instructions given to you at your visit today.  Please pick up your prep supplies at the pharmacy within the next 1-3 days. If you use inhalers (even only as needed), please bring them with you on the day of your procedure.   You will be contacted by our office prior to your procedure for directions on holding your Plavix.  If you do not hear from our office 1 week prior to your scheduled procedure, please call [phone] to discuss.    If your blood pressure at your visit was 140/90 or greater, please contact your primary care physician to follow up on this.  _______________________________________________________  If you are age 4 or older, your body mass index should be between 23-30. Your Body mass index is 28.76 kg/m. If this is out of the aforementioned range listed, please consider follow up with your Primary Care Provider.  If you are age 54 or younger, your body mass index should be between 19-25. Your Body mass index is 28.76 kg/m. If this is out of the aformentioned range listed, please consider follow up with your Primary Care Provider.   ________________________________________________________  The [facility] providers would like to encourage you to use [facility] to communicate with providers for non-urgent requests or questions.  Due to long hold times on the telephone, sending your provider a message by [facility] may be a faster and more efficient way to get a response.  Please allow 48 business hours for a response.  Please remember that this is for non-urgent requests.   Due to recent changes in healthcare laws, you may see the results of your imaging and laboratory studies on MyChart before your provider has had a chance to review them.  We understand that in some cases there may be results that are confusing or concerning to you. Not all laboratory results come back in the same time frame and the provider may be  waiting for multiple results in order to interpret others.  Please give [place] 48 hours in order for your provider to thoroughly review all the results before contacting the office for clarification of your results.  Thank you for entrusting me with your care and choosing [facility].  Ellouise Newer PA-C

## 2022-03-09 ENCOUNTER — Telehealth: Payer: Self-pay

## 2022-03-09 NOTE — Telephone Encounter (Signed)
Patient cannot take Maxalt because of history of strokes.  She would like to know of an alternative medication she can take.  Please call [phone]

## 2022-03-10 ENCOUNTER — Ambulatory Visit: Payer: BC Managed Care – PPO | Admitting: Family Medicine

## 2022-03-13 NOTE — Telephone Encounter (Signed)
There really is not an alternative medicine for the abortive therapy for when headache occurs.  She would need to discuss more options with her neurology team

## 2022-03-13 NOTE — Telephone Encounter (Signed)
Called pt and left a VM to call back.  

## 2022-03-13 NOTE — Telephone Encounter (Signed)
Spoke with patient regarding results/recommendations.  

## 2022-03-13 NOTE — Telephone Encounter (Signed)
Contacted patient and pt is aware and understanding that it is ok to hold Plavix 5 days prior to her procedure per Cardiology.

## 2022-03-15 ENCOUNTER — Ambulatory Visit: Payer: BC Managed Care – PPO | Admitting: Family Medicine

## 2022-03-19 ENCOUNTER — Other Ambulatory Visit: Payer: BC Managed Care – PPO

## 2022-03-20 ENCOUNTER — Encounter: Payer: Self-pay | Admitting: Gastroenterology

## 2022-03-22 ENCOUNTER — Encounter: Payer: Self-pay | Admitting: Family Medicine

## 2022-03-22 ENCOUNTER — Ambulatory Visit: Payer: BC Managed Care – PPO | Admitting: Family Medicine

## 2022-03-22 VITALS — BP 126/87 | HR 72 | Temp 98.0°F | Wt 180.6 lb

## 2022-03-22 DIAGNOSIS — E538 Deficiency of other specified B group vitamins: Secondary | ICD-10-CM | POA: Diagnosis not present

## 2022-03-22 DIAGNOSIS — R5383 Other fatigue: Secondary | ICD-10-CM

## 2022-03-22 DIAGNOSIS — G43809 Other migraine, not intractable, without status migrainosus: Secondary | ICD-10-CM

## 2022-03-22 MED ORDER — ALENDRONATE SODIUM 70 MG PO TABS: 70.0000 mg | ORAL_TABLET | ORAL | 11 refills | Status: DC

## 2022-03-22 MED ORDER — CYANOCOBALAMIN 1000 MCG/ML IJ SOLN
1000.0000 ug | Freq: Once | INTRAMUSCULAR | Status: AC
  Administered 2022-03-22: 1000 ug via INTRAMUSCULAR

## 2022-03-22 NOTE — Progress Notes (Deleted)
62 y.o. G66P0020 Divorced Caucasian female here for annual exam.    PCP:     Patient's last menstrual period was 01/31/2007.           Sexually active: {yes no:314532}  The current method of family planning is post menopausal status.    Exercising: {yes no:314532}  {types:19826} Smoker:  no  Health Maintenance: Pap:  03/16/21 ASCUS: HR HPV neg, 02/20/18 neg: HR HPV neg History of abnormal Pap:  no MMG:  08/08/21 Breast Density Category A, BI-RADS CATEGORY 1 Neg Colonoscopy:  09/20/20 BMD:   12/08/19  Result  osteoporosis TDaP:  01/30/14 Gardasil:   no HIV: 12/17/20 NR Hep C: 2016 Neg Screening Labs:  Hb today: ***, Urine today: ***   reports that she has never smoked. She has never been exposed to tobacco smoke. She has never used smokeless tobacco. She reports current alcohol use of about 1.0 standard drink of alcohol per week. She reports that she does not use drugs.  Past Medical History:  Diagnosis Date   Anxiety    Arthritis    knees and spine, shoulder   Asthma    Bronchitis    hx - recurrent   Complication of anesthesia    waking up is not easy   Depression    Elevated IgE level 09/12/2017   09/12/2017 IgE 195   History of COVID-19 12/30/2020   Hx of irritable bowel syndrome    x2   Hyperlipidemia    diet controlled - no medication   Hypertension    not taking any meds at present - under control per patient   Hypokalemia    with PNA admission (2.5)   Hypothyroidism (acquired)    Ischemic cerebrovascular accident (CVA) ([place])    Migraines    Pneumonia    4 episodes; hosp. admission 2014   PONV (postoperative nausea and vomiting)    TIA (transient ischemic attack) 12/17/2020   UC (ulcerative colitis) [facility]    DX'D 2021    Past Surgical History:  Procedure Laterality Date   BREAST SURGERY     implants, then had them removed   COLONOSCOPY     greater 10 yrs ago - ? [facility]-2017 LAST   DILATION AND CURETTAGE OF UTERUS     IR ANGIO INTRA EXTRACRAN  SEL INTERNAL CAROTID BILAT MOD SED  11/19/2020   IR ANGIO VERTEBRAL SEL VERTEBRAL BILAT MOD SED  11/19/2020   IR ANGIO VERTEBRAL SEL VERTEBRAL UNI L MOD SED  02/25/2021   IR ANGIO VERTEBRAL SEL VERTEBRAL UNI L MOD SED  01/13/2022   IR ANGIOGRAM FOLLOW UP STUDY  11/19/2020   IR ANGIOGRAM FOLLOW UP STUDY  11/19/2020   IR ANGIOGRAM FOLLOW UP STUDY  11/19/2020   IR ANGIOGRAM FOLLOW UP STUDY  11/19/2020   IR ANGIOGRAM FOLLOW UP STUDY  11/19/2020   IR ANGIOGRAM FOLLOW UP STUDY  11/19/2020   IR ANGIOGRAM FOLLOW UP STUDY  11/19/2020   IR ANGIOGRAM FOLLOW UP STUDY  11/19/2020   IR INTRA CRAN STENT  11/19/2020   IR TRANSCATH/EMBOLIZ  11/19/2020   LAPAROSCOPIC ABDOMINAL EXPLORATION  01/31/1992   endometriosis   ORIF HUMERUS FRACTURE Left 04/01/2013   DR Ninfa Linden - shoulder   ORIF HUMERUS FRACTURE Left 04/01/2013   Procedure: OPEN REDUCTION INTERNAL FIXATION (ORIF) LEFT PROXIMAL HUMERUS FRACTURE;  Surgeon: Mcarthur Rossetti, MD;  Location: [place];  Service: Orthopedics;  Laterality: Left;   RADIOLOGY WITH ANESTHESIA N/A 11/19/2020   Procedure: stent supported coiling of  basilar aneurysm;  Surgeon: Consuella Lose, MD;  Location: [place];  Service: Radiology;  Laterality: N/A;   TONSILLECTOMY AND ADENOIDECTOMY      Current Outpatient Medications  Medication Sig Dispense Refill   alendronate (FOSAMAX) 70 MG tablet Take 1 tablet (70 mg total) by mouth every 7 (seven) days. Take with a full glass of water on an empty stomach. (Patient taking differently: Take 70 mg by mouth every Monday.) 4 tablet 11   amLODipine (NORVASC) 10 MG tablet Take 1 tablet (10 mg total) by mouth daily. 90 tablet 1   ASCORBIC ACID PO Take 1 tablet by mouth every evening. Vitamin C, unknown strength.     aspirin EC 81 MG tablet Take 81 mg by mouth daily. Swallow whole.     atorvastatin (LIPITOR) 80 MG tablet Take 1 tablet (80 mg total) by mouth daily. 90 tablet 1   Budeson-Glycopyrrol-Formoterol (BREZTRI AEROSPHERE)  160-9-4.8 MCG/ACT AERO Inhale 2 puffs into the lungs in the morning and at bedtime. (Patient taking differently: Inhale 2 puffs into the lungs 2 (two) times daily as needed (asthma).) 5.9 g 1   Cholecalciferol (VITAMIN D-3 PO) Take 1 capsule by mouth in the morning.     clonazePAM (KLONOPIN) 0.5 MG tablet Take 1 tablet (0.5 mg total) by mouth 2 (two) times daily as needed for anxiety. 180 tablet 1   clopidogrel (PLAVIX) 75 MG tablet Take 1 tablet (75 mg total) by mouth daily. 30 tablet 1   ezetimibe (ZETIA) 10 MG tablet Take 1 tablet (10 mg total) by mouth daily. (Patient taking differently: Take 10 mg by mouth every evening.) 90 tablet 3   levothyroxine (SYNTHROID) 125 MCG tablet Take 1 tablet (125 mcg total) by mouth daily. On an empty stomach. (Patient taking differently: Take 125 mcg by mouth in the morning.) 90 tablet 3   Na Sulfate-K Sulfate-Mg Sulf 17.5-3.13-1.6 GM/177ML SOLN Use as directed; may use generic; goodrx card if insurance will not cover generic 354 mL 0   PARoxetine (PAXIL) 40 MG tablet Take 1 tablet (40 mg total) by mouth daily. 90 tablet 1   No current facility-administered medications for this visit.    Family History  Problem Relation Age of Onset   Breast cancer Mother    Diabetes Mother    Heart disease Mother    Diabetes Father    Dementia Father    Colon cancer Father    Stroke Paternal Grandmother    Diabetes Paternal Grandmother    Colon polyps Neg Hx    Esophageal cancer Neg Hx    Stomach cancer Neg Hx    Rectal cancer Neg Hx     Review of Systems  Exam:   LMP 01/31/2007     General appearance: alert, cooperative and appears stated age Head: normocephalic, without obvious abnormality, atraumatic Neck: no adenopathy, supple, symmetrical, trachea midline and thyroid normal to inspection and palpation Lungs: clear to auscultation bilaterally Breasts: normal appearance, no masses or tenderness, No nipple retraction or dimpling, No nipple discharge or  bleeding, No axillary adenopathy Heart: regular rate and rhythm Abdomen: soft, non-tender; no masses, no organomegaly Extremities: extremities normal, atraumatic, no cyanosis or edema Skin: skin color, texture, turgor normal. No rashes or lesions Lymph nodes: cervical, supraclavicular, and axillary nodes normal. Neurologic: grossly normal  Pelvic: External genitalia:  no lesions              No abnormal inguinal nodes palpated.  Urethra:  normal appearing urethra with no masses, tenderness or lesions              Bartholins and Skenes: normal                 Vagina: normal appearing vagina with normal color and discharge, no lesions              Cervix: no lesions              Pap taken: {yes no:314532} Bimanual Exam:  Uterus:  normal size, contour, position, consistency, mobility, non-tender              Adnexa: no mass, fullness, tenderness              Rectal exam: {yes no:314532}.  Confirms.              Anus:  normal sphincter tone, no lesions  Chaperone was present for exam:  ***  Assessment:   Well woman visit with gynecologic exam.   Plan: Mammogram screening discussed. Self breast awareness reviewed. Pap and HR HPV as above. Guidelines for Calcium, Vitamin D, regular exercise program including cardiovascular and weight bearing exercise.   Follow up annually and prn.   Additional counseling given.  {yes [id number]. _______ minutes face to face time of which over 50% was spent in counseling.    After visit summary provided.

## 2022-03-22 NOTE — Progress Notes (Signed)
Tiffany Horn , 07-Aug-1960, 62 y.o., female MRN: [id number] Patient Care Team    Relationship Specialty Notifications Start End  [name], DO PCP - General Family Medicine  04/28/20   Jerline Pain, MD PCP - Cardiology Cardiology  03/01/20   [name], MD Consulting Physician Gastroenterology  05/29/16   [name], CNM Referring Physician Certified Nurse Midwife  05/29/16   Gari Crown, MD Referring Physician Obstetrics and Gynecology  07/17/17   Garner Nash, DO Consulting Physician Pulmonary Disease  06/12/19     Chief Complaint  Patient presents with   Fatigue    3+ months     Subjective: Tiffany Horn is a  62 y.o. Pt presents for an OV with concerns of continued fatigue.  She reports compliance with her thyroid medication which has been under supplemented in the past from compliance.  She reports her asthma seems to be well-controlled.  Has a history of CVA.  She follows closely with her neurosurgical team.  She has a history of aneurysms as well.  She is wondering if she is lacking in any vitamins that could be creating her fatigue.  She has had B12 deficiency in the past and was on B12 injections.     09/13/2021    2:52 PM 04/12/2021    2:14 PM 03/07/2021    8:33 AM 11/25/2020    9:44 AM 05/05/2020   10:48 AM  Depression screen PHQ 2/9  Decreased Interest 1 1 0 0 0  Down, Depressed, Hopeless 0 1 0 0 0  PHQ - 2 Score 1 2 0 0 0  Altered sleeping 3 0     Tired, decreased energy 2 0     Change in appetite 0 0     Feeling bad or failure about yourself  0 0     Trouble concentrating 2 0     Moving slowly or fidgety/restless 0 0     Suicidal thoughts 0 0     PHQ-9 Score 8 2       Allergies  Allergen Reactions   Abilify [Aripiprazole] Nausea And Vomiting   Amoxil [Amoxicillin] Diarrhea   Promethazine Hcl Other (See Comments)    Made pt feel "weird"   Seroquel [Quetiapine Fumarate] Other (See Comments)    Pt does not recall reaction    Social History   Social History Narrative   She is originally from [place]. She has traveled to [facility], [place], [place], [place], [place], [place], [place], [place], [place]. No international travel. She has dogs. No prior bird, mold, or recent hot tub exposure. She hasn't used her hot tub in 1.5 years. 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 Previously enjoyed gardening and playing tennis. Helps to care for her mother.   Past Medical History:  Diagnosis Date   Anxiety    Arthritis    knees and spine, shoulder   Asthma    Bronchitis    hx - recurrent   Complication of anesthesia    waking up is not easy   Depression    Elevated IgE level 09/12/2017   09/12/2017 IgE 195   History of COVID-19 12/30/2020   Hx of irritable bowel syndrome    x2   Hyperlipidemia    diet controlled - no medication   Hypertension    not taking any meds at present - under control per patient  Hypokalemia    with PNA admission (2.5)   Hypothyroidism (acquired)    Ischemic cerebrovascular accident (CVA) ([place])    Migraines    Pneumonia    4 episodes; hosp. admission 2014   PONV (postoperative nausea and vomiting)    TIA (transient ischemic attack) 12/17/2020   UC (ulcerative colitis) [facility])    DX'D 2021   Past Surgical History:  Procedure Laterality Date   BREAST SURGERY     implants, then had them removed   COLONOSCOPY     greater 10 yrs ago - ? [facility]-2017 LAST   DILATION AND CURETTAGE OF UTERUS     IR ANGIO INTRA EXTRACRAN SEL INTERNAL CAROTID BILAT MOD SED  11/19/2020   IR ANGIO VERTEBRAL SEL VERTEBRAL BILAT MOD SED  11/19/2020   IR ANGIO VERTEBRAL SEL VERTEBRAL UNI L MOD SED  02/25/2021   IR ANGIO VERTEBRAL SEL VERTEBRAL UNI L MOD SED  01/13/2022   IR ANGIOGRAM FOLLOW UP STUDY  11/19/2020   IR ANGIOGRAM FOLLOW UP STUDY  11/19/2020   IR ANGIOGRAM FOLLOW UP STUDY  11/19/2020   IR ANGIOGRAM FOLLOW UP STUDY  11/19/2020   IR ANGIOGRAM FOLLOW UP STUDY   11/19/2020   IR ANGIOGRAM FOLLOW UP STUDY  11/19/2020   IR ANGIOGRAM FOLLOW UP STUDY  11/19/2020   IR ANGIOGRAM FOLLOW UP STUDY  11/19/2020   IR INTRA CRAN STENT  11/19/2020   IR TRANSCATH/EMBOLIZ  11/19/2020   LAPAROSCOPIC ABDOMINAL EXPLORATION  01/31/1992   endometriosis   ORIF HUMERUS FRACTURE Left 04/01/2013   DR Ninfa Linden - shoulder   ORIF HUMERUS FRACTURE Left 04/01/2013   Procedure: OPEN REDUCTION INTERNAL FIXATION (ORIF) LEFT PROXIMAL HUMERUS FRACTURE;  Surgeon: Mcarthur Rossetti, MD;  Location: [place];  Service: Orthopedics;  Laterality: Left;   RADIOLOGY WITH ANESTHESIA N/A 11/19/2020   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;  Surgeon: Consuella Lose, MD;  Location: [place];  Service: Radiology;  Laterality: N/A;   TONSILLECTOMY AND ADENOIDECTOMY     Family History  Problem Relation Age of Onset   Breast cancer Mother    Diabetes Mother    Heart disease Mother    Diabetes Father    Dementia Father    Colon cancer Father    Stroke Paternal Grandmother    Diabetes Paternal Grandmother    Colon polyps Neg Hx    Esophageal cancer Neg Hx    Stomach cancer Neg Hx    Rectal cancer Neg Hx    Allergies as of 03/22/2022       Reactions   Abilify [aripiprazole] Nausea And Vomiting   Amoxil [amoxicillin] Diarrhea   Promethazine Hcl Other (See Comments)   Made pt feel "weird"   Seroquel [quetiapine Fumarate] Other (See Comments)   Pt does not recall reaction        Medication List        Accurate as of March 22, 2022 11:59 PM. If you have any questions, ask your nurse or doctor.          STOP taking these medications    Na Sulfate-K Sulfate-Mg Sulf 17.5-3.13-1.6 GM/177ML Soln Stopped by: Howard Pouch, DO       TAKE these medications    alendronate 70 MG tablet Commonly known as: FOSAMAX Take 1 tablet (70 mg total) by mouth every 7 (seven) days. Take with a full glass of water on an empty stomach.   amLODipine 10 MG  tablet Commonly known as: NORVASC Take 1 tablet (10  mg total) by mouth daily.   ASCORBIC ACID PO Take 1 tablet by mouth every evening. Vitamin C, unknown strength.   aspirin EC 81 MG tablet Take 81 mg by mouth daily. Swallow whole.   atorvastatin 80 MG tablet Commonly known as: LIPITOR Take 1 tablet (80 mg total) by mouth daily.   Breztri Aerosphere 160-9-4.8 MCG/ACT Aero Generic drug: Budeson-Glycopyrrol-Formoterol Inhale 2 puffs into the lungs in the morning and at bedtime.   clonazePAM 0.5 MG tablet Commonly known as: KLONOPIN Take 1 tablet (0.5 mg total) by mouth 2 (two) times daily as needed for anxiety.   clopidogrel 75 MG tablet Commonly known as: PLAVIX Take 1 tablet (75 mg total) by mouth daily.   ezetimibe 10 MG tablet Commonly known as: Zetia Take 1 tablet (10 mg total) by mouth daily. What changed: when to take this   levothyroxine 125 MCG tablet Commonly known as: SYNTHROID Take 1 tablet (125 mcg total) by mouth daily. On an empty stomach. What changed:  when to take this additional instructions   PARoxetine 40 MG tablet Commonly known as: PAXIL Take 1 tablet (40 mg total) by mouth daily.   VITAMIN D-3 PO Take 1 capsule by mouth in the morning.        All past medical history, surgical history, allergies, family history, immunizations andmedications were updated in the EMR today and reviewed under the history and medication portions of their EMR.     ROS Negative, with the exception of above mentioned in HPI   Objective:  BP 126/87   Pulse 72   Temp 98 F (36.7 C)   Wt 180 lb 9.6 oz (81.9 kg)   LMP 01/31/2007   SpO2 96%   BMI 29.15 kg/m  Body mass index is 29.15 kg/m. Physical Exam Vitals and nursing note reviewed.  Constitutional:      General: She is not in acute distress.    Appearance: Normal appearance. She is not ill-appearing, toxic-appearing or diaphoretic.  HENT:     Head: Normocephalic and atraumatic.  Eyes:      General: No scleral icterus.       Right eye: No discharge.        Left eye: No discharge.     Extraocular Movements: Extraocular movements intact.     Conjunctiva/sclera: Conjunctivae normal.     Pupils: Pupils are equal, round, and reactive to light.  Cardiovascular:     Rate and Rhythm: Normal rate and regular rhythm.  Pulmonary:     Effort: Pulmonary effort is normal. No respiratory distress.     Breath sounds: Normal breath sounds. No wheezing, rhonchi or rales.  Musculoskeletal:     Right lower leg: No edema.  Skin:    General: Skin is warm and dry.     Findings: No rash.  Neurological:     Mental Status: She is alert and oriented to person, place, and time. Mental status is at baseline.     Motor: No weakness.     Gait: Gait normal.  Psychiatric:        Mood and Affect: Mood normal.        Behavior: Behavior normal.        Thought Content: Thought content normal.        Judgment: Judgment normal.    No results found. No results found. No results found for this or any previous visit (from the past 24 hour(s)).  Assessment/Plan: Tiffany Horn is a 62 y.o. female present for  OV for  migraine without status migrainosus, not intractable much fatigue/B12 deficiency Fatigue could be multifactorial.  Will rule out vitamin deficiencies and thyroid as potential cause.  Make any adjustments we can to assist her. If labs do not indicate cause, would consider sleep apnea as a possible cause. - B12 and Folate Panel - Vitamin D (25 hydroxy) - Magnesium - PTH, Intact and Calcium - TSH - T4, free - Iron, TIBC and Ferritin Panel  B12 deficiency B12 levels collected today before injection provided - cyanocobalamin (VITAMIN B12) injection 1,000 mcg   Reviewed expectations re: course of current medical issues. Discussed self-management of symptoms. Outlined signs and symptoms indicating need for more acute intervention. Patient verbalized understanding and all questions were  answered. Patient received an After-Visit Summary.    Orders Placed This Encounter  Procedures   B12 and Folate Panel   Vitamin D (25 hydroxy)   Magnesium   PTH, Intact and Calcium   TSH   T4, free   Iron, TIBC and Ferritin Panel   Meds ordered this encounter  Medications   alendronate (FOSAMAX) 70 MG tablet    Sig: Take 1 tablet (70 mg total) by mouth every 7 (seven) days. Take with a full glass of water on an empty stomach.    Dispense:  4 tablet    Refill:  11   cyanocobalamin (VITAMIN B12) injection 1,000 mcg   Referral Orders  No referral(s) requested today     Note is dictated utilizing voice recognition software. Although note has been proof read prior to signing, occasional typographical errors still can be missed. If any questions arise, please do not hesitate to call for verification.   electronically signed by:  Howard Pouch, DO  [facility]

## 2022-03-23 LAB — VITAMIN D 25 HYDROXY (VIT D DEFICIENCY, FRACTURES): Vit D, 25-Hydroxy: 27 ng/mL — ABNORMAL LOW (ref 30–100)

## 2022-03-23 LAB — TSH: TSH: 4.81 mIU/L — ABNORMAL HIGH (ref 0.40–4.50)

## 2022-03-23 LAB — T4, FREE: Free T4: 0.8 ng/dL (ref 0.8–1.8)

## 2022-03-23 LAB — PTH, INTACT AND CALCIUM
Calcium: 9.1 mg/dL (ref 8.6–10.4)
PTH: 36 pg/mL (ref 16–77)

## 2022-03-23 LAB — IRON,TIBC AND FERRITIN PANEL
%SAT: 27 % (calc) (ref 16–45)
Ferritin: 29 ng/mL (ref 16–232)
Iron: 91 ug/dL (ref 45–160)
TIBC: 334 mcg/dL (calc) (ref 250–450)

## 2022-03-23 LAB — MAGNESIUM: Magnesium: 2 mg/dL (ref 1.5–2.5)

## 2022-03-23 LAB — B12 AND FOLATE PANEL
Folate: 11.2 ng/mL
Vitamin B-12: 351 pg/mL (ref 200–1100)

## 2022-03-24 ENCOUNTER — Telehealth: Payer: Self-pay | Admitting: Cardiology

## 2022-03-24 ENCOUNTER — Telehealth: Payer: Self-pay | Admitting: Family Medicine

## 2022-03-24 DIAGNOSIS — E039 Hypothyroidism, unspecified: Secondary | ICD-10-CM

## 2022-03-24 MED ORDER — LEVOTHYROXINE SODIUM 125 MCG PO TABS: ORAL_TABLET | ORAL | 3 refills | Status: DC

## 2022-03-24 NOTE — Telephone Encounter (Signed)
Spoke with pt regarding labs and instructions.   

## 2022-03-24 NOTE — Telephone Encounter (Signed)
    Sinus rhythm average heart rate of 78 bpm.   5 SVT runs,  paroxysmal atrial tachycardia noted.   Rare PACs, rare PVCs.   No signs of atrial fibrillation.  Reassuring.   Pt has reviewed results and Dr Marlou Porch comments via [facility]

## 2022-03-24 NOTE — Telephone Encounter (Signed)
Please call patient Calcium, magnesium, iron and parathyroid levels are all normal.  Her thyroid hormone is still a little under replaced.  I have called in refills on her thyroid medication.  She is going to stay on the same dose Monday through Saturday.  On Sunday she will take 1.5 tabs.  This should bump her into normal levels of thyroid. Vitamin D levels are lower than normal and B12 levels are lower than normal.  For recommendations on vitamin D replacement I would need to know for certain what dose of vitamin D she is taking.  She was to look on the side of her bottle.   B12 replacement-she can elect to have B12 injections once monthly if desired.  I would also recommend she start B12 1000 mcg daily, and she needs to pick up the sublingual format for best absorption.

## 2022-03-24 NOTE — Telephone Encounter (Signed)
Follow Up:     Patient Tiffany Horn calling to see if her Monitor results are ready please?

## 2022-03-27 ENCOUNTER — Telehealth: Payer: Self-pay | Admitting: Gastroenterology

## 2022-03-27 ENCOUNTER — Encounter: Payer: Self-pay | Admitting: Family Medicine

## 2022-03-27 ENCOUNTER — Ambulatory Visit (INDEPENDENT_AMBULATORY_CARE_PROVIDER_SITE_OTHER): Payer: BC Managed Care – PPO | Admitting: Family Medicine

## 2022-03-27 ENCOUNTER — Telehealth: Payer: Self-pay | Admitting: Cardiology

## 2022-03-27 VITALS — BP 120/68 | HR 79 | Temp 98.3°F | Ht 66.0 in | Wt 178.6 lb

## 2022-03-27 DIAGNOSIS — J029 Acute pharyngitis, unspecified: Secondary | ICD-10-CM

## 2022-03-27 DIAGNOSIS — R051 Acute cough: Secondary | ICD-10-CM | POA: Diagnosis not present

## 2022-03-27 LAB — POCT INFLUENZA A/B
Influenza A, POC: NEGATIVE
Influenza B, POC: NEGATIVE

## 2022-03-27 LAB — POCT RAPID STREP A (OFFICE): Rapid Strep A Screen: NEGATIVE

## 2022-03-27 LAB — POC COVID19 BINAXNOW: SARS Coronavirus 2 Ag: NEGATIVE

## 2022-03-27 NOTE — Telephone Encounter (Signed)
Pt called to follow up on Monitor results

## 2022-03-27 NOTE — Telephone Encounter (Signed)
Patient called to cancel her procedure scheduled for tomm. is at her dr's office getting tested for Flu, Covid and strep her dr advised her to postpone.

## 2022-03-27 NOTE — Telephone Encounter (Signed)
Jerline Pain, MD 03/24/2022  2:49 PM EST       Sinus rhythm average heart rate of 78 bpm.   5 SVT runs,  paroxysmal atrial tachycardia noted.   Rare PACs, rare PVCs.   No signs of atrial fibrillation.  Reassuring.    Reviewed results with pt who states understanding.

## 2022-03-27 NOTE — Progress Notes (Signed)
OFFICE VISIT  03/27/2022  CC:  Chief Complaint  Patient presents with   Sore Throat    Pt also c/o head and nasal cong; bilat ear pain;headache; started today; no otc meds used    Patient is a 62 y.o. female who presents for sore throat.  HPI: Onset about 9-10 hrs ago:  nasal cong/PND, ST, ears hurting, cough, fatigue. No fever, no wheezing or SOB.  No n/v/d. 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  Past Medical History:  Diagnosis Date   Anxiety    Arthritis    knees and spine, shoulder   Asthma    Bronchitis    hx - recurrent   Complication of anesthesia    waking up is not easy   Depression    Elevated IgE level 09/12/2017   09/12/2017 IgE 195   History of COVID-19 12/30/2020   Hx of irritable bowel syndrome    x2   Hyperlipidemia    diet controlled - no medication   Hypertension    not taking any meds at present - under control per patient   Hypokalemia    with PNA admission (2.5)   Hypothyroidism (acquired)    Ischemic cerebrovascular accident (CVA) ([place])    Migraines    Pneumonia    4 episodes; hosp. admission 2014   PONV (postoperative nausea and vomiting)    TIA (transient ischemic attack) 12/17/2020   UC (ulcerative colitis) [facility])    DX'D 2021    Past Surgical History:  Procedure Laterality Date   BREAST SURGERY     implants, then had them removed   COLONOSCOPY     greater 10 yrs ago - ? [facility]-2017 LAST   DILATION AND CURETTAGE OF UTERUS     IR ANGIO INTRA EXTRACRAN SEL INTERNAL CAROTID BILAT MOD SED  11/19/2020   IR ANGIO VERTEBRAL SEL VERTEBRAL BILAT MOD SED  11/19/2020   IR ANGIO VERTEBRAL SEL VERTEBRAL UNI L MOD SED  02/25/2021   IR ANGIO VERTEBRAL SEL VERTEBRAL UNI L MOD SED  01/13/2022   IR ANGIOGRAM FOLLOW UP STUDY  11/19/2020   IR ANGIOGRAM FOLLOW UP STUDY  11/19/2020   IR ANGIOGRAM FOLLOW UP STUDY  11/19/2020   IR ANGIOGRAM FOLLOW UP STUDY  11/19/2020   IR ANGIOGRAM FOLLOW UP STUDY   11/19/2020   IR ANGIOGRAM FOLLOW UP STUDY  11/19/2020   IR ANGIOGRAM FOLLOW UP STUDY  11/19/2020   IR ANGIOGRAM FOLLOW UP STUDY  11/19/2020   IR INTRA CRAN STENT  11/19/2020   IR TRANSCATH/EMBOLIZ  11/19/2020   LAPAROSCOPIC ABDOMINAL EXPLORATION  01/31/1992   endometriosis   ORIF HUMERUS FRACTURE Left 04/01/2013   DR Ninfa Linden - shoulder   ORIF HUMERUS FRACTURE Left 04/01/2013   Procedure: OPEN REDUCTION INTERNAL FIXATION (ORIF) LEFT PROXIMAL HUMERUS FRACTURE;  Surgeon: Mcarthur Rossetti, MD;  Location: [place];  Service: Orthopedics;  Laterality: Left;   RADIOLOGY WITH ANESTHESIA N/A 11/19/2020   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;  Surgeon: Consuella Lose, MD;  Location: [place];  Service: Radiology;  Laterality: N/A;   TONSILLECTOMY AND ADENOIDECTOMY      Outpatient Medications Prior to Visit  Medication Sig Dispense Refill   alendronate (FOSAMAX) 70 MG tablet Take 1 tablet (70 mg total) by mouth every 7 (seven) days. Take with a full glass of water on an empty stomach. 4 tablet 11   amLODipine (NORVASC) 10 MG tablet Take 1 tablet (10 mg total)  by mouth daily. 90 tablet 1   ASCORBIC ACID PO Take 1 tablet by mouth every evening. Vitamin C, unknown strength.     aspirin EC 81 MG tablet Take 81 mg by mouth daily. Swallow whole.     atorvastatin (LIPITOR) 80 MG tablet Take 1 tablet (80 mg total) by mouth daily. 90 tablet 1   Cholecalciferol (VITAMIN D-3 PO) Take 1 capsule by mouth in the morning.     clonazePAM (KLONOPIN) 0.5 MG tablet Take 1 tablet (0.5 mg total) by mouth 2 (two) times daily as needed for anxiety. 180 tablet 1   clopidogrel (PLAVIX) 75 MG tablet Take 1 tablet (75 mg total) by mouth daily. 30 tablet 1   ezetimibe (ZETIA) 10 MG tablet Take 1 tablet (10 mg total) by mouth daily. (Patient taking differently: Take 10 mg by mouth every evening.) 90 tablet 3   levothyroxine (SYNTHROID) 125 MCG tablet 1 tab daily p.o. on an empty stomach Monday through  Saturday, Sunday 1.5 tabs p.o. 94 tablet 3   PARoxetine (PAXIL) 40 MG tablet Take 1 tablet (40 mg total) by mouth daily. 90 tablet 1   Budeson-Glycopyrrol-Formoterol (BREZTRI AEROSPHERE) 160-9-4.8 MCG/ACT AERO Inhale 2 puffs into the lungs in the morning and at bedtime. (Patient not taking: Reported on 03/22/2022) 5.9 g 1   No facility-administered medications prior to visit.    Allergies  Allergen Reactions   Abilify [Aripiprazole] Nausea And Vomiting   Amoxil [Amoxicillin] Diarrhea   Promethazine Hcl Other (See Comments)    Made pt feel "weird"   Seroquel [Quetiapine Fumarate] Other (See Comments)    Pt does not recall reaction    Review of Systems  As per HPI  PE:    03/27/2022    1:14 PM 03/22/2022    1:29 PM 03/06/2022    1:50 PM  Vitals with BMI  Height '5\' 6"'$   '5\' 6"'$   Weight 178 lbs 10 oz 180 lbs 10 oz   BMI 123XX123 [id number]   Systolic 123456 [id number]  Diastolic 68 87 86  Pulse 79 72 82     Physical Exam  VS: noted--normal. Gen: alert, NAD, NONTOXIC APPEARING. HEENT: eyes without injection, drainage, or swelling.  Ears: EACs clear, TMs with normal light reflex and landmarks.  Nose: Clear rhinorrhea, with some dried, crusty exudate adherent to mildly injected mucosa.  No purulent d/c.  No paranasal sinus TTP.  No facial swelling.  Throat and mouth without focal lesion.  No pharyngial swelling, erythema, or exudate.   Neck: supple, no LAD.   LUNGS: CTA bilat, nonlabored resps.   CV: RRR, no m/r/g. EXT: no c/c/e SKIN: no rash   LABS:  Last CBC Lab Results  Component Value Date   WBC 5.0 01/04/2022   HGB 14.6 01/04/2022   HCT 43.0 01/04/2022   MCV 90.8 01/04/2022   MCH 31.3 01/04/2022   RDW 12.7 01/04/2022   PLT 207 [id number]   Last metabolic panel Lab Results  Component Value Date   GLUCOSE 107 (H) 01/04/2022   NA 141 01/04/2022   K 3.8 01/04/2022   CL 106 01/04/2022   CO2 25 01/04/2022   BUN 11 01/04/2022   CREATININE 0.70 01/04/2022   GFRNONAA >60  01/04/2022   CALCIUM 9.1 03/22/2022   PHOS 4.8 (H) 12/06/2020   PROT 6.9 01/04/2022   ALBUMIN 3.9 01/04/2022   BILITOT 0.5 01/04/2022   ALKPHOS 54 01/04/2022   AST 19 01/04/2022   ALT 19 01/04/2022   ANIONGAP 7 01/04/2022  IMPRESSION AND PLAN:  Viral URI with cough/congestion. Rapid strep today: NEG Flu and covid today: NEG Symptomatic care with saline nasal spray and otc antihistamine and/or mucinex dm prn.  An After Visit Summary was printed and given to the patient.  FOLLOW UP: Return if symptoms worsen or fail to improve.  Signed:  Crissie Sickles, MD           03/27/2022

## 2022-03-28 ENCOUNTER — Encounter: Payer: BC Managed Care – PPO | Admitting: Gastroenterology

## 2022-03-30 ENCOUNTER — Other Ambulatory Visit: Payer: BC Managed Care – PPO

## 2022-04-03 ENCOUNTER — Ambulatory Visit: Payer: BC Managed Care – PPO | Admitting: Orthopaedic Surgery

## 2022-04-04 ENCOUNTER — Ambulatory Visit: Payer: BC Managed Care – PPO | Admitting: Obstetrics and Gynecology

## 2022-04-06 ENCOUNTER — Encounter: Payer: Self-pay | Admitting: Radiology

## 2022-04-17 ENCOUNTER — Ambulatory Visit
Admission: RE | Admit: 2022-04-17 | Discharge: 2022-04-17 | Disposition: A | Discharge status: Home / Self Care | Payer: BC Managed Care – PPO | Source: Ambulatory Visit | Attending: Orthopaedic Surgery | Admitting: Orthopaedic Surgery

## 2022-04-17 DIAGNOSIS — G8929 Other chronic pain: Secondary | ICD-10-CM

## 2022-04-24 ENCOUNTER — Other Ambulatory Visit: Payer: Self-pay

## 2022-04-24 ENCOUNTER — Ambulatory Visit (INDEPENDENT_AMBULATORY_CARE_PROVIDER_SITE_OTHER): Payer: BC Managed Care – PPO | Admitting: Sports Medicine

## 2022-04-24 ENCOUNTER — Ambulatory Visit: Payer: BC Managed Care – PPO | Admitting: Orthopaedic Surgery

## 2022-04-24 ENCOUNTER — Encounter: Payer: Self-pay | Admitting: Sports Medicine

## 2022-04-24 DIAGNOSIS — M25512 Pain in left shoulder: Secondary | ICD-10-CM | POA: Diagnosis not present

## 2022-04-24 DIAGNOSIS — G8929 Other chronic pain: Secondary | ICD-10-CM

## 2022-04-24 MED ORDER — LIDOCAINE HCL 1 % IJ SOLN
2.0000 mL | INTRAMUSCULAR | Status: AC | PRN
  Administered 2022-04-24: 2 mL

## 2022-04-24 MED ORDER — METHYLPREDNISOLONE ACETATE 40 MG/ML IJ SUSP
40.0000 mg | INTRAMUSCULAR | Status: AC | PRN
  Administered 2022-04-24: 40 mg via INTRA_ARTICULAR

## 2022-04-24 MED ORDER — BUPIVACAINE HCL 0.25 % IJ SOLN
2.0000 mL | INTRAMUSCULAR | Status: AC | PRN
  Administered 2022-04-24: 2 mL via INTRA_ARTICULAR

## 2022-04-24 NOTE — Progress Notes (Signed)
The patient is well-known to [place].  She has remote history of open reduction/internal fixation of a complex left proximal humerus fracture many years ago.  This required plating.  She has been having some shoulder problems recently with moderate pain so we did send her for an MRI of the left shoulder to rule out AVN and assess the cartilage.  Her x-ray shows irregularity of the left humeral head.  On exam I can still gently put her through internal and external rotation with some pain.  She says is not severe enough to consider shoulder replacement.  The MRI of her shoulder shows no muscle atrophy.  The rotator cuff is intact.  The biceps tendon is also intact.  There is moderate thinning of the cartilage and a chronic superior labral tear.  She would like to try an intra-articular steroid injection under ultrasound in her left shoulder joint and I agree with this as well based on the amount of pain that she is having.  She is not at the point with needing to consider shoulder arthroplasty as of yet.  Will see if Dr. Rolena Infante can see her for an ultrasound-guided intra-articular injection in her left shoulder joint and then I would be happy to see her back in 6 to 8 weeks after that.

## 2022-04-24 NOTE — Progress Notes (Signed)
   Procedure Note  Patient: Tiffany Horn             Date of Birth: [birth year]           MRN: [id number]             Visit Date: 04/24/2022  Procedures: Visit Diagnoses:  1. Chronic left shoulder pain    Large Joint Inj: L glenohumeral on 04/24/2022 10:49 AM Indications: pain Details: 22 G 3.5 in needle, ultrasound-guided posterior approach Medications: 2 mL lidocaine 1 %; 2 mL bupivacaine 0.25 %; 40 mg methylPREDNISolone acetate 40 MG/ML Outcome: tolerated well, no immediate complications  US-guided glenohumeral joint injection, left shoulder After discussion on risks/benefits/indications, informed verbal consent was obtained. A timeout was then performed. The patient was positioned lying lateral recumbent on examination table. The patient's shoulder was prepped with betadine and multiple alcohol swabs and utilizing ultrasound guidance, the patient's glenohumeral joint was identified on ultrasound. Using ultrasound guidance a 22-gauge, 3.5 inch needle with a mixture of 2:2:1 cc's lidocaine:bupivicaine:depomedrol was directed from a lateral to medial direction via in-plane technique into the glenohumeral joint with visualization of appropriate spread of injectate into the joint. Patient tolerated the procedure well without immediate complications.      Procedure, treatment alternatives, risks and benefits explained, specific risks discussed. Consent was given by the patient. Immediately prior to procedure a time out was called to verify the correct patient, procedure, equipment, support staff and site/side marked as required. Patient was prepped and draped in the usual sterile fashion.    - I evaluated the patient about 10 minutes post-injection and she had improvement in pain and range of motion - follow-up with Dr. Ninfa Linden as indicated; I am happy to see them as needed  Elba Barman, DO [place]  This note was  dictated using Dragon naturally speaking software and may contain errors in syntax, spelling, or content which have not been identified prior to signing this note.

## 2022-05-15 ENCOUNTER — Encounter: Payer: Self-pay | Admitting: Gastroenterology

## 2022-06-13 ENCOUNTER — Ambulatory Visit (AMBULATORY_SURGERY_CENTER): Payer: BC Managed Care – PPO | Admitting: *Deleted

## 2022-06-13 VITALS — Ht 66.0 in | Wt 175.0 lb

## 2022-06-13 DIAGNOSIS — Z86010 Personal history of colonic polyps: Secondary | ICD-10-CM

## 2022-06-13 DIAGNOSIS — Z800 Family history of malignant neoplasm of digestive organs: Secondary | ICD-10-CM

## 2022-06-13 NOTE — Progress Notes (Signed)
No egg or soy allergy known to patient  No issues known to pt with past sedation with any surgeries or procedures Patient denies ever being told they had issues or difficulty with intubation  No FH of Malignant Hyperthermia Pt is not on diet pills Pt is not on  home 02  Pt is  on blood thinners.PLAVIX  Pt denies issues with constipation  No A fib or A flutter Have any cardiac testing pending--no Pt instructed to use Singlecare.com or GoodRx for a price reduction on prep    ALREADY HAS PREP FROM PREVIOUS APPOINTMENT.  Patient's chart reviewed by Cathlyn Parsons CNRA prior to previsit and patient appropriate for the [facility].  Previsit completed and red dot placed by patient's name on their procedure day (on provider's schedule).     INDEPENDENT WITH MOBILITY

## 2022-07-03 ENCOUNTER — Encounter: Payer: Self-pay | Admitting: Gastroenterology

## 2022-07-05 ENCOUNTER — Ambulatory Visit: Payer: BC Managed Care – PPO | Admitting: Family Medicine

## 2022-07-05 ENCOUNTER — Encounter: Payer: Self-pay | Admitting: Family Medicine

## 2022-07-05 VITALS — BP 116/79 | HR 77 | Temp 98.0°F | Wt 178.0 lb

## 2022-07-05 DIAGNOSIS — R7983 Abnormal findings of blood amino-acid level: Secondary | ICD-10-CM

## 2022-07-05 DIAGNOSIS — F419 Anxiety disorder, unspecified: Secondary | ICD-10-CM

## 2022-07-05 DIAGNOSIS — R5383 Other fatigue: Secondary | ICD-10-CM | POA: Diagnosis not present

## 2022-07-05 DIAGNOSIS — E538 Deficiency of other specified B group vitamins: Secondary | ICD-10-CM

## 2022-07-05 DIAGNOSIS — F330 Major depressive disorder, recurrent, mild: Secondary | ICD-10-CM | POA: Diagnosis not present

## 2022-07-05 DIAGNOSIS — E559 Vitamin D deficiency, unspecified: Secondary | ICD-10-CM

## 2022-07-05 DIAGNOSIS — E034 Atrophy of thyroid (acquired): Secondary | ICD-10-CM

## 2022-07-05 LAB — FOLATE: Folate: 11.4 ng/mL (ref >5.9)

## 2022-07-05 LAB — CBC WITH DIFFERENTIAL/PLATELET
Basophils Absolute: 0.1 10*3/uL (ref 0.0–0.1)
Basophils Relative: 1.1 % (ref 0.0–3.0)
Eosinophils Absolute: 0.4 10*3/uL (ref 0.0–0.7)
Eosinophils Relative: 9.6 % — ABNORMAL HIGH (ref 0.0–5.0)
HCT: 43.6 % (ref 36.0–46.0)
Hemoglobin: 14.5 g/dL (ref 12.0–15.0)
Lymphocytes Relative: 36.9 % (ref 12.0–46.0)
Lymphs Abs: 1.7 10*3/uL (ref 0.7–4.0)
MCHC: 33.2 g/dL (ref 30.0–36.0)
MCV: 91.2 fl (ref 78.0–100.0)
Monocytes Absolute: 0.4 10*3/uL (ref 0.1–1.0)
Monocytes Relative: 8 % (ref 3.0–12.0)
Neutro Abs: 2.1 10*3/uL (ref 1.4–7.7)
Neutrophils Relative %: 44.4 % (ref 43.0–77.0)
Platelets: 244 10*3/uL (ref 150.0–400.0)
RBC: 4.78 Mil/uL (ref 3.87–5.11)
RDW: 13.3 % (ref 11.5–15.5)
WBC: 4.6 10*3/uL (ref 4.0–10.5)

## 2022-07-05 LAB — TSH: TSH: 3.74 u[IU]/mL (ref 0.35–5.50)

## 2022-07-05 LAB — VITAMIN D 25 HYDROXY (VIT D DEFICIENCY, FRACTURES): VITD: 24 ng/mL — ABNORMAL LOW (ref 30.00–100.00)

## 2022-07-05 LAB — CORTISOL: Cortisol, Plasma: 9.7 ug/dL

## 2022-07-05 LAB — VITAMIN B12: Vitamin B-12: 251 pg/mL (ref 211–911)

## 2022-07-05 NOTE — Patient Instructions (Addendum)
Nurse visit for genesight swabbing with your updated insurance card.        Great to see you today.  I have refilled the medication(s) we provide.   If labs were collected, we will inform you of lab results once received either by echart message or telephone call.   - echart message- for normal results that have been seen by the patient already.   - telephone call: abnormal results or if patient has not viewed results in their echart.

## 2022-07-05 NOTE — Progress Notes (Signed)
Tiffany Horn , 14-Apr-1960, 62 y.o., female MRN: [id number] Patient Care Team    Relationship Specialty Notifications Start End  Natalia Leatherwood, DO PCP - General Family Medicine  04/28/20   Jake Bathe, MD PCP - Cardiology Cardiology  03/01/20   Napoleon Form, MD Consulting Physician Gastroenterology  05/29/16   Verner Chol, CNM Referring Physician Certified Nurse Midwife  05/29/16   Ernestina Penna, MD Referring Physician Obstetrics and Gynecology  07/17/17   Josephine Igo, DO Consulting Physician Pulmonary Disease  06/12/19     Chief Complaint  Patient presents with   Fatigue    C/o depression with fatigue; feels like something is not right; states compliant with medication;     Subjective: DONNIESHA BOITNOTT is a  62 y.o. Pt presents for an OV with concerns of continued fatigue.  She reports compliance with her thyroid medication which has been under supplemented in the past from compliance.  She reports her asthma seems to be well-controlled.  Has a history of CVA.  She follows closely with her neurosurgical team.  She has a history of aneurysms as well.  She is wondering if she is lacking in any vitamins that could be creating her fatigue.  She has had B12 deficiency in the past and was on B12 injections. Reports she never feels rested however over the last month things have become "rough.  "She reports she just does not feel good she feels like something is wrong, "not right.  "She has been more moody and irritable. She is seeing a therapist and had her significant other go to therapy session with her to discuss his drinking patterns effects on the relationship.     07/05/2022    9:34 AM 09/13/2021    2:52 PM 04/12/2021    2:14 PM 03/07/2021    8:33 AM 11/25/2020    9:44 AM  Depression screen PHQ 2/9  Decreased Interest 3 1 1  0 0  Down, Depressed, Hopeless 3 0 1 0 0  PHQ - 2 Score 6 1 2  0 0  Altered sleeping 3 3 0    Tired, decreased energy 3 2 0    Change in  appetite 1 0 0    Feeling bad or failure about yourself  1 0 0    Trouble concentrating 2 2 0    Moving slowly or fidgety/restless 0 0 0    Suicidal thoughts 0 0 0    PHQ-9 Score 16 8 2     Difficult doing work/chores Somewhat difficult        Allergies  Allergen Reactions   Abilify [Aripiprazole] Nausea And Vomiting   Amoxil [Amoxicillin] Diarrhea   Promethazine Hcl Other (See Comments)    Made pt feel "weird"   Seroquel [Quetiapine Fumarate] Other (See Comments)    Pt does not recall reaction   Social History   Social History Narrative   She is originally from [place]. She has traveled to [facility], [place], [place], [place], [place], [place], [place], [place], [place]. No international travel. She has dogs. No prior bird, mold, or recent hot tub exposure. She hasn't used her hot tub in 1.5 years. 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 Previously enjoyed gardening and playing tennis. Helps to care for her mother.   Past Medical History:  Diagnosis Date   Allergy    Anxiety    Arthritis  knees and spine, shoulder   Asthma    Bronchitis    hx - recurrent   Complication of anesthesia    waking up is not easy   Depression    Elevated IgE level 09/12/2017   09/12/2017 IgE 195   GERD (gastroesophageal reflux disease)    History of COVID-19 12/30/2020   Hx of irritable bowel syndrome    x2   Hyperlipidemia    diet controlled - no medication   Hypertension    not taking any meds at present - under control per patient   Hypokalemia    with PNA admission (2.5)   Hypothyroidism (acquired)    Ischemic cerebrovascular accident (CVA) ([facility])    Migraines    Osteoporosis    Pneumonia    4 episodes; hosp. admission 2014   PONV (postoperative nausea and vomiting)    TIA (transient ischemic attack) 12/17/2020   UC (ulcerative colitis) [facility]    DX'D 2021   Past Surgical History:  Procedure Laterality Date   ANEURYSM COILING     STENT   BREAST SURGERY      implants, then had them removed   COLONOSCOPY     greater 10 yrs ago - ? [facility]-2017 LAST   DILATION AND CURETTAGE OF UTERUS     IR ANGIO INTRA EXTRACRAN SEL INTERNAL CAROTID BILAT MOD SED  11/19/2020   IR ANGIO VERTEBRAL SEL VERTEBRAL BILAT MOD SED  11/19/2020   IR ANGIO VERTEBRAL SEL VERTEBRAL UNI L MOD SED  02/25/2021   IR ANGIO VERTEBRAL SEL VERTEBRAL UNI L MOD SED  01/13/2022   IR ANGIOGRAM FOLLOW UP STUDY  11/19/2020   IR ANGIOGRAM FOLLOW UP STUDY  11/19/2020   IR ANGIOGRAM FOLLOW UP STUDY  11/19/2020   IR ANGIOGRAM FOLLOW UP STUDY  11/19/2020   IR ANGIOGRAM FOLLOW UP STUDY  11/19/2020   IR ANGIOGRAM FOLLOW UP STUDY  11/19/2020   IR ANGIOGRAM FOLLOW UP STUDY  11/19/2020   IR ANGIOGRAM FOLLOW UP STUDY  11/19/2020   IR INTRA CRAN STENT  11/19/2020   IR TRANSCATH/EMBOLIZ  11/19/2020   LAPAROSCOPIC ABDOMINAL EXPLORATION  01/31/1992   endometriosis   ORIF HUMERUS FRACTURE Left 04/01/2013   DR Magnus Ivan - shoulder   ORIF HUMERUS FRACTURE Left 04/01/2013   Procedure: OPEN REDUCTION INTERNAL FIXATION (ORIF) LEFT PROXIMAL HUMERUS FRACTURE;  Surgeon: Kathryne Hitch, MD;  Location: [facility] OR;  Service: Orthopedics;  Laterality: Left;   RADIOLOGY WITH ANESTHESIA N/A 11/19/2020   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;  Surgeon: Lisbeth Renshaw, MD;  Location: [facility] OR;  Service: Radiology;  Laterality: N/A;   TONSILLECTOMY AND ADENOIDECTOMY     Family History  Problem Relation Age of Onset   Breast cancer Mother    Diabetes Mother    Heart disease Mother    Diabetes Father    Dementia Father    Colon cancer Father    Stroke Paternal Grandmother    Diabetes Paternal Grandmother    Colon polyps Neg Hx    Esophageal cancer Neg Hx    Stomach cancer Neg Hx    Rectal cancer Neg Hx    Crohn's disease Neg Hx    Ulcerative colitis Neg Hx    Allergies as of 07/05/2022       Reactions   Abilify [aripiprazole] Nausea And Vomiting   Amoxil [amoxicillin]  Diarrhea   Promethazine Hcl Other (See Comments)   Made pt feel "weird"   Seroquel [quetiapine Fumarate] Other (See Comments)  Pt does not recall reaction        Medication List        Accurate as of July 05, 2022 11:59 PM. If you have any questions, ask your nurse or doctor.          alendronate 70 MG tablet Commonly known as: FOSAMAX Take 1 tablet (70 mg total) by mouth every 7 (seven) days. Take with a full glass of water on an empty stomach.   amLODipine 10 MG tablet Commonly known as: NORVASC Take 1 tablet (10 mg total) by mouth daily.   ASCORBIC ACID PO Take 1 tablet by mouth every evening. Vitamin C, unknown strength.   aspirin EC 81 MG tablet Take 81 mg by mouth daily. Swallow whole.   atorvastatin 80 MG tablet Commonly known as: LIPITOR Take 1 tablet (80 mg total) by mouth daily.   Breztri Aerosphere 160-9-4.8 MCG/ACT Aero Generic drug: Budeson-Glycopyrrol-Formoterol Inhale 2 puffs into the lungs in the morning and at bedtime.   clonazePAM 0.5 MG tablet Commonly known as: KLONOPIN Take 1 tablet (0.5 mg total) by mouth 2 (two) times daily as needed for anxiety.   clopidogrel 75 MG tablet Commonly known as: PLAVIX Take 1 tablet (75 mg total) by mouth daily.   ezetimibe 10 MG tablet Commonly known as: Zetia Take 1 tablet (10 mg total) by mouth daily. What changed: when to take this   levothyroxine 125 MCG tablet Commonly known as: SYNTHROID 1 tab daily p.o. on an empty stomach Monday through Saturday, Sunday 1.5 tabs p.o.   OVER THE COUNTER MEDICATION daily. B VITAMINS   PARoxetine 40 MG tablet Commonly known as: PAXIL Take 1 tablet (40 mg total) by mouth daily.   VITAMIN D-3 PO Take 1 capsule by mouth daily.   ZINC PO Take by mouth daily.        All past medical history, surgical history, allergies, family history, immunizations andmedications were updated in the EMR today and reviewed under the history and medication portions of their  EMR.     ROS Negative, with the exception of above mentioned in HPI   Objective:  BP 116/79   Pulse 77   Temp 98 F (36.7 C)   Wt 178 lb (80.7 kg)   LMP 01/31/2007   SpO2 96%   BMI 28.73 kg/m  Body mass index is 28.73 kg/m. Physical Exam Vitals and nursing note reviewed.  Constitutional:      General: She is not in acute distress.    Appearance: Normal appearance. She is not ill-appearing, toxic-appearing or diaphoretic.  HENT:     Head: Normocephalic and atraumatic.  Eyes:     General: No scleral icterus.       Right eye: No discharge.        Left eye: No discharge.     Extraocular Movements: Extraocular movements intact.     Conjunctiva/sclera: Conjunctivae normal.     Pupils: Pupils are equal, round, and reactive to light.  Cardiovascular:     Rate and Rhythm: Normal rate and regular rhythm.  Pulmonary:     Effort: Pulmonary effort is normal. No respiratory distress.     Breath sounds: Normal breath sounds. No wheezing, rhonchi or rales.  Musculoskeletal:     Right lower leg: No edema.  Skin:    General: Skin is warm and dry.     Findings: No rash.  Neurological:     Mental Status: She is alert and oriented to person, place, and time. Mental  status is at baseline.     Motor: No weakness.     Gait: Gait normal.  Psychiatric:        Mood and Affect: Mood normal.        Behavior: Behavior normal.        Thought Content: Thought content normal.        Judgment: Judgment normal.    No results found. No results found. No results found for this or any previous visit (from the past 24 hour(s)).  Assessment/Plan: MARELIN AFOLABI is a 62 y.o. female present for OV for  Major depressive disorder, recurrent episode, mild (HCC)/anxiety/moodiness We discussed different options today to help with her moodiness. She has been pretty happy with herself been able to get off the majority of her mental health meds with the exception of Klonopin as needed.  She has been  tried on a few different types of SSRIs/SNRIs in the past.  She would like to wait on starting a medication for now until lab work is returned. We did discuss GeneSight testing today to get a better idea of her chemistry pattern and what will work for her by her genetics.  She is very interested in have this completed.  She is aware there may be an out-of-pocket cost for a portion of this lab.  Vitamin D deficiency/B12 deficiency -B12 levels collected today - Vitamin D (25 hydroxy)  fatigue Possibly multifactorial.  She is under some stress within her relationship, has a underactive thyroid, vitamin deficiencies and heart disease. Will start workup today and attempt to narrow down potential etiology. - TSH - Thyroid peroxidase antibody - Comp Met (CMET) - Vitamin D (25 hydroxy) - B12 - Homocysteine - Folate - Cortisol - CBC w/Diff  Hypothyroidism due to acquired atrophy of thyroid -TSH and thyroid peroxidase antibody Levothyroxine 125 mcg continue unless labs indicate need to adjust dose.  Reviewed expectations re: course of current medical issues. Discussed self-management of symptoms. Outlined signs and symptoms indicating need for more acute intervention. Patient verbalized understanding and all questions were answered. Patient received an After-Visit Summary.   Dependent upon laboratory results and GeneSight testing results Orders Placed This Encounter  Procedures   TSH   Thyroid peroxidase antibody   Comp Met (CMET)   Vitamin D (25 hydroxy)   B12   Homocysteine   Folate   Cortisol   CBC w/Diff   No orders of the defined types were placed in this encounter.  Referral Orders  No referral(s) requested today     Note is dictated utilizing voice recognition software. Although note has been proof read prior to signing, occasional typographical errors still can be missed. If any questions arise, please do not hesitate to call for verification.   electronically  signed by:  Felix Pacini, DO  [facility] - OR

## 2022-07-06 ENCOUNTER — Telehealth: Payer: Self-pay | Admitting: Family Medicine

## 2022-07-06 ENCOUNTER — Ambulatory Visit: Payer: BC Managed Care – PPO | Admitting: Family Medicine

## 2022-07-06 LAB — COMPREHENSIVE METABOLIC PANEL
ALT: 15 U/L (ref 0–35)
AST: 17 U/L (ref 0–37)
Albumin: 4.5 g/dL (ref 3.5–5.2)
Alkaline Phosphatase: 69 U/L (ref 39–117)
BUN: 16 mg/dL (ref 6–23)
CO2: 22 mEq/L (ref 19–32)
Calcium: 9.4 mg/dL (ref 8.4–10.5)
Chloride: 105 mEq/L (ref 96–112)
Creatinine, Ser: 0.76 mg/dL (ref 0.40–1.20)
GFR: 85.39 mL/min (ref >60.00)
Glucose, Bld: 94 mg/dL (ref 70–99)
Potassium: 4.4 mEq/L (ref 3.5–5.1)
Sodium: 141 mEq/L (ref 135–145)
Total Bilirubin: 0.5 mg/dL (ref 0.2–1.2)
Total Protein: 7.2 g/dL (ref 6.0–8.3)

## 2022-07-06 MED ORDER — VITAMIN D (ERGOCALCIFEROL) 1.25 MG (50000 UNIT) PO CAPS: 50000.0000 [IU] | ORAL_CAPSULE | ORAL | 3 refills | Status: DC

## 2022-07-06 NOTE — Telephone Encounter (Signed)
Please call patient Blood cell count and electrolytes are normal.  She does mild increase in her eosinophils, which are marker of increased allergies. Her glucose/sugar levels are normal. Her thyroid levels are normal. Her folate levels are normal. Her vitamin D is low at 24-she had asked for prescription vitamin D, therefore I did call in the high-dose vitamin D supplement, however she would take this once every 2 weeks with food    Her B12 is low at 251-   -Options are to use the over-the-counter B12 sublingual 2000 mcg daily.  This comes in solution or dissolvable tab that is placed under the tongue for best absorption. Or She could elect to complete B12 injections which she could receive at the office.  Initially this would be scheduled every 2 weeks for 4 doses, and then monthly.  If she desires in office injections please set her up for nurse visits.     I have completed my portion of the GeneSight testing and placed in holder in my office.  Patient is to make a nurse visit and bring in her new insurance card so that we can photocopy it not only for our records for the office, but a photocopy needs to be sent with the GeneSight testing and buccal swabbing collected by nurse.

## 2022-07-06 NOTE — Telephone Encounter (Signed)
Results sent through mychart 

## 2022-07-07 ENCOUNTER — Telehealth: Payer: Self-pay

## 2022-07-07 LAB — THYROID PEROXIDASE ANTIBODY: Thyroperoxidase Ab SerPl-aCnc: 484 IU/mL — ABNORMAL HIGH (ref <9)

## 2022-07-07 LAB — HOMOCYSTEINE: Homocysteine: 12.4 umol/L — ABNORMAL HIGH (ref <10.4)

## 2022-07-07 MED ORDER — LEVOTHYROXINE SODIUM 150 MCG PO TABS
150.0000 ug | ORAL_TABLET | Freq: Every day | ORAL | 1 refills | Status: DC
Start: 2022-07-07 — End: 2022-10-13

## 2022-07-07 NOTE — Telephone Encounter (Signed)
Insurance card is not getting issued every year card we have in correct according to insurance.

## 2022-07-07 NOTE — Telephone Encounter (Signed)
Left pt a vm to call about latest lab results. Will send the results in mychart if pt does not return my call.

## 2022-07-07 NOTE — Telephone Encounter (Signed)
Please call patient regarding test results.  [phone]

## 2022-07-07 NOTE — Addendum Note (Signed)
Addended by: Felix Pacini A on: 07/07/2022 12:18 PM   Modules accepted: Orders

## 2022-07-07 NOTE — Telephone Encounter (Signed)
Pt states that insurance is not issuing new cards for retirees. The card that we have on file is correct. Pt is asking about hashimoto's disease due to abnormal thyroid lab. Pt is aware that we have not gotten the last results from labs and will be contacted once available.

## 2022-07-07 NOTE — Telephone Encounter (Addendum)
We can try to provide the current insurance card to the [employer]. She still needs to schedule nurse appointment to have the collection of the swabs.  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  This can cause cardiac, vascular issues as well as the symptoms she is reporting.  She needs to be on B12 injections.  Please make sure she did get the phone call that I asked staff to give her about her results.  On 07/06/2022. -Since her homocysteine levels are elevated I would encourage her to elect for the B12 shots in the office, at least initially.   Her thyroid antibody test is still high despite normal tsh.  This can cause increased fatigue also.  I elected to increase her thyroid pill to levothyroxine 150 mg daily.  She will take 1 tab daily now.

## 2022-07-10 ENCOUNTER — Ambulatory Visit (INDEPENDENT_AMBULATORY_CARE_PROVIDER_SITE_OTHER): Payer: BC Managed Care – PPO

## 2022-07-10 DIAGNOSIS — E538 Deficiency of other specified B group vitamins: Secondary | ICD-10-CM

## 2022-07-10 MED ORDER — CYANOCOBALAMIN 1000 MCG/ML IJ SOLN
1000.0000 ug | Freq: Once | INTRAMUSCULAR | Status: AC
Start: 2022-07-10 — End: 2022-07-10
  Administered 2022-07-10: 1000 ug via INTRAMUSCULAR

## 2022-07-10 NOTE — Progress Notes (Signed)
Pt here for monthly B12 injection per Dr Claiborne Billings. Today is her first dose which will be weekly  B12 given IM, and pt tolerated injection well.  Next B12 injection scheduled for 1 week

## 2022-07-12 ENCOUNTER — Ambulatory Visit (AMBULATORY_SURGERY_CENTER): Payer: BC Managed Care – PPO | Admitting: Gastroenterology

## 2022-07-12 ENCOUNTER — Encounter: Payer: Self-pay | Admitting: Gastroenterology

## 2022-07-12 VITALS — BP 117/71 | HR 69 | Temp 97.7°F | Resp 17 | Ht 66.0 in | Wt 175.0 lb

## 2022-07-12 DIAGNOSIS — Z86010 Personal history of colonic polyps: Secondary | ICD-10-CM

## 2022-07-12 DIAGNOSIS — D123 Benign neoplasm of transverse colon: Secondary | ICD-10-CM | POA: Diagnosis not present

## 2022-07-12 DIAGNOSIS — Z09 Encounter for follow-up examination after completed treatment for conditions other than malignant neoplasm: Secondary | ICD-10-CM

## 2022-07-12 DIAGNOSIS — R7983 Abnormal findings of blood amino-acid level: Secondary | ICD-10-CM | POA: Insufficient documentation

## 2022-07-12 MED ORDER — SODIUM CHLORIDE 0.9 % IV SOLN
500.0000 mL | Freq: Once | INTRAVENOUS | Status: DC
Start: 2022-07-12 — End: 2022-07-12

## 2022-07-12 NOTE — Progress Notes (Signed)
Uneventful anesthetic. Report to pacu rn. Vss. Care resumed by rn. 

## 2022-07-12 NOTE — Progress Notes (Signed)
Pt's states no medical or surgical changes since previsit or office visit. 

## 2022-07-12 NOTE — Patient Instructions (Signed)
Please read handouts provided. Continue present medications. Await pathology results. Resume Plavix ( clopidogrel ) at prior dose in 2 days. Repeat colonoscopy in 3 years for screening.   YOU HAD AN ENDOSCOPIC PROCEDURE TODAY AT THE [facility]:   Refer to the procedure report that was given to you for any specific questions about what was found during the examination.  If the procedure report does not answer your questions, please call your gastroenterologist to clarify.  If you requested that your care partner not be given the details of your procedure findings, then the procedure report has been included in a sealed envelope for you to review at your convenience later.  YOU SHOULD EXPECT: Some feelings of bloating in the abdomen. Passage of more gas than usual.  Walking can help get rid of the air that was put into your GI tract during the procedure and reduce the bloating. If you had a lower endoscopy (such as a colonoscopy or flexible sigmoidoscopy) you may notice spotting of blood in your stool or on the toilet paper. If you underwent a bowel prep for your procedure, you may not have a normal bowel movement for a few days.  Please Note:  You might notice some irritation and congestion in your nose or some drainage.  This is from the oxygen used during your procedure.  There is no need for concern and it should clear up in a day or so.  SYMPTOMS TO REPORT IMMEDIATELY:  Following lower endoscopy (colonoscopy or flexible sigmoidoscopy):  Excessive amounts of blood in the stool  Significant tenderness or worsening of abdominal pains  Swelling of the abdomen that is new, acute  Fever of 100F or higher   For urgent or emergent issues, a gastroenterologist can be reached at any hour by calling [phone]. Do not use MyChart messaging for urgent concerns.    DIET:  We do recommend a small meal at first, but then you may proceed to your regular diet.  Drink plenty of fluids  but you should avoid alcoholic beverages for 24 hours.  ACTIVITY:  You should plan to take it easy for the rest of today and you should NOT DRIVE or use heavy machinery until tomorrow (because of the sedation medicines used during the test).    FOLLOW UP: Our staff will call the number listed on your records the next business day following your procedure.  We will call around 7:15- 8:00 am to check on you and address any questions or concerns that you may have regarding the information given to you following your procedure. If we do not reach you, we will leave a message.     If any biopsies were taken you will be contacted by phone or by letter within the next 1-3 weeks.  Please call us at [phone] if you have not heard about the biopsies in 3 weeks.    SIGNATURES/CONFIDENTIALITY: You and/or your care partner have signed paperwork which will be entered into your electronic medical record.  These signatures attest to the fact that that the information above on your After Visit Summary has been reviewed and is understood.  Full responsibility of the confidentiality of this discharge information lies with you and/or your care-partner.

## 2022-07-12 NOTE — Progress Notes (Signed)
[facility] History and Physical   Primary Care Physician:  Natalia Leatherwood, DO   Reason for Procedure:  History of adenomatous colon polyps, family h/o colon cancer  Plan:    Surveillance colonoscopy with possible interventions as needed     HPI: Tiffany Horn is a very pleasant 62 y.o. female here for surveillance colonoscopy. Denies any nausea, vomiting, abdominal pain, melena or bright red blood per rectum  The risks and benefits as well as alternatives of endoscopic procedure(s) have been discussed and reviewed. All questions answered. The patient agrees to proceed.    Past Medical History:  Diagnosis Date   Allergy    Anxiety    Arthritis    knees and spine, shoulder   Asthma    Bronchitis    hx - recurrent   Complication of anesthesia    waking up is not easy   Depression    Elevated IgE level 09/12/2017   09/12/2017 IgE 195   GERD (gastroesophageal reflux disease)    History of COVID-19 12/30/2020   Hx of irritable bowel syndrome    x2   Hyperlipidemia    diet controlled - no medication   Hypertension    not taking any meds at present - under control per patient   Hypokalemia    with PNA admission (2.5)   Hypothyroidism (acquired)    Ischemic cerebrovascular accident (CVA) (HCC)    Migraines    Osteoporosis    Pneumonia    4 episodes; hosp. admission 2014   PONV (postoperative nausea and vomiting)    TIA (transient ischemic attack) 12/17/2020   UC (ulcerative colitis) [facility])    DX'D 2021    Past Surgical History:  Procedure Laterality Date   ANEURYSM COILING     STENT   BREAST SURGERY     implants, then had them removed   COLONOSCOPY     greater 10 yrs ago - ? [facility]-2017 LAST   DILATION AND CURETTAGE OF UTERUS     IR ANGIO INTRA EXTRACRAN SEL INTERNAL CAROTID BILAT MOD SED  11/19/2020   IR ANGIO VERTEBRAL SEL VERTEBRAL BILAT MOD SED  11/19/2020   IR ANGIO VERTEBRAL SEL VERTEBRAL UNI L MOD SED  02/25/2021   IR ANGIO  VERTEBRAL SEL VERTEBRAL UNI L MOD SED  01/13/2022   IR ANGIOGRAM FOLLOW UP STUDY  11/19/2020   IR ANGIOGRAM FOLLOW UP STUDY  11/19/2020   IR ANGIOGRAM FOLLOW UP STUDY  11/19/2020   IR ANGIOGRAM FOLLOW UP STUDY  11/19/2020   IR ANGIOGRAM FOLLOW UP STUDY  11/19/2020   IR ANGIOGRAM FOLLOW UP STUDY  11/19/2020   IR ANGIOGRAM FOLLOW UP STUDY  11/19/2020   IR ANGIOGRAM FOLLOW UP STUDY  11/19/2020   IR INTRA CRAN STENT  11/19/2020   IR TRANSCATH/EMBOLIZ  11/19/2020   LAPAROSCOPIC ABDOMINAL EXPLORATION  01/31/1992   endometriosis   ORIF HUMERUS FRACTURE Left 04/01/2013   DR Magnus Ivan - shoulder   ORIF HUMERUS FRACTURE Left 04/01/2013   Procedure: OPEN REDUCTION INTERNAL FIXATION (ORIF) LEFT PROXIMAL HUMERUS FRACTURE;  Surgeon: Kathryne Hitch, MD;  Location: [facility] OR;  Service: Orthopedics;  Laterality: Left;   RADIOLOGY WITH ANESTHESIA N/A 11/19/2020   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 Authorizing Provider  amLODipine (NORVASC)  10 MG tablet Take 1 tablet (10 mg total) by mouth daily. 01/27/22  Yes Kuneff, Renee A, DO  ASCORBIC ACID PO Take 1 tablet by mouth every evening. Vitamin C, unknown strength.   Yes [provider]  aspirin EC 81 MG tablet Take 81 mg by mouth daily. Swallow whole.   Yes [provider]  atorvastatin (LIPITOR) 80 MG tablet Take 1 tablet (80 mg total) by mouth daily. 01/27/22  Yes Kuneff, Renee A, DO  Cholecalciferol (VITAMIN D-3 PO) Take 1 capsule by mouth daily.   Yes [provider]  levothyroxine (SYNTHROID) 150 MCG tablet Take 1 tablet (150 mcg total) by mouth daily before breakfast. 07/07/22  Yes Kuneff, Renee A, DO  Multiple Vitamins-Minerals (ZINC PO) Take by mouth daily.   Yes [provider]  OVER THE  COUNTER MEDICATION daily. B VITAMINS   Yes [provider]  PARoxetine (PAXIL) 40 MG tablet Take 1 tablet (40 mg total) by mouth daily. 01/27/22  Yes Kuneff, Renee A, DO  alendronate (FOSAMAX) 70 MG tablet Take 1 tablet (70 mg total) by mouth every 7 (seven) days. Take with a full glass of water on an empty stomach. 03/22/22   Kuneff, Renee A, DO  Budeson-Glycopyrrol-Formoterol (BREZTRI AEROSPHERE) 160-9-4.8 MCG/ACT AERO Inhale 2 puffs into the lungs in the morning and at bedtime. 04/28/20   Icard, Rachel Bo, DO  clonazePAM (KLONOPIN) 0.5 MG tablet Take 1 tablet (0.5 mg total) by mouth 2 (two) times daily as needed for anxiety. 01/27/22   Kuneff, Renee A, DO  clopidogrel (PLAVIX) 75 MG tablet Take 1 tablet (75 mg total) by mouth daily. 01/05/22   Leatha Gilding, MD  ezetimibe (ZETIA) 10 MG tablet Take 1 tablet (10 mg total) by mouth daily. Patient taking differently: Take 10 mg by mouth every evening. 09/16/21   Kuneff, Renee A, DO  Vitamin D, Ergocalciferol, (DRISDOL) 1.25 MG (50000 UNIT) CAPS capsule Take 1 capsule (50,000 Units total) by mouth every 14 (fourteen) days. 07/06/22   Felix Pacini A, DO    Current Outpatient Medications  Medication Sig Dispense Refill   amLODipine (NORVASC) 10 MG tablet Take 1 tablet (10 mg total) by mouth daily. 90 tablet 1   ASCORBIC ACID PO Take 1 tablet by mouth every evening. Vitamin C, unknown strength.     aspirin EC 81 MG tablet Take 81 mg by mouth daily. Swallow whole.     atorvastatin (LIPITOR) 80 MG tablet Take 1 tablet (80 mg total) by mouth daily. 90 tablet 1   Cholecalciferol (VITAMIN D-3 PO) Take 1 capsule by mouth daily.     levothyroxine (SYNTHROID) 150 MCG tablet Take 1 tablet (150 mcg total) by mouth daily before breakfast. 90 tablet 1   Multiple Vitamins-Minerals (ZINC PO) Take by mouth daily.     OVER THE COUNTER MEDICATION daily. B VITAMINS     PARoxetine (PAXIL) 40 MG tablet Take 1 tablet (40 mg total) by mouth daily. 90 tablet 1    alendronate (FOSAMAX) 70 MG tablet Take 1 tablet (70 mg total) by mouth every 7 (seven) days. Take with a full glass of water on an empty stomach. 4 tablet 11   Budeson-Glycopyrrol-Formoterol (BREZTRI AEROSPHERE) 160-9-4.8 MCG/ACT AERO Inhale 2 puffs into the lungs in the morning and at bedtime. 5.9 g 1   clonazePAM (KLONOPIN) 0.5 MG tablet Take 1 tablet (0.5 mg total) by mouth 2 (two) times daily as needed for anxiety. 180 tablet 1   clopidogrel (PLAVIX) 75 MG  tablet Take 1 tablet (75 mg total) by mouth daily. 30 tablet 1   ezetimibe (ZETIA) 10 MG tablet Take 1 tablet (10 mg total) by mouth daily. (Patient taking differently: Take 10 mg by mouth every evening.) 90 tablet 3   Vitamin D, Ergocalciferol, (DRISDOL) 1.25 MG (50000 UNIT) CAPS capsule Take 1 capsule (50,000 Units total) by mouth every 14 (fourteen) days. 6 capsule 3   Current Facility-Administered Medications  Medication Dose Route Frequency Provider Last Rate Last Admin   0.9 %  sodium chloride infusion  500 mL Intravenous Once Napoleon Form, MD        Allergies as of 07/12/2022 - Review Complete 07/12/2022  Allergen Reaction Noted   Abilify [aripiprazole] Nausea And Vomiting 09/03/2019   Amoxil [amoxicillin] Diarrhea 06/11/2015   Promethazine hcl Other (See Comments) 12/29/2020   Seroquel [quetiapine fumarate] Other (See Comments) 09/29/2016    Family History  Problem Relation Age of Onset   Breast cancer Mother    Diabetes Mother    Heart disease Mother    Diabetes Father    Dementia Father    Colon cancer Father    Stroke Paternal Grandmother    Diabetes Paternal Grandmother    Colon polyps Neg Hx    Esophageal cancer Neg Hx    Stomach cancer Neg Hx    Rectal cancer Neg Hx    Crohn's disease Neg Hx    Ulcerative colitis Neg Hx     Social History   Socioeconomic History   Marital status: Divorced    Spouse name: Not on file   Number of children: 0   Years of education: Not on file   Highest education  level: Bachelor's degree (e.g., BA, AB, BS)  Occupational History   Occupation: Geologist, engineering  Tobacco Use   Smoking status: Never    Passive exposure: Never   Smokeless tobacco: Never  Vaping Use   Vaping Use: Never used  Substance and Sexual Activity   Alcohol use: Yes    Alcohol/week: 1.0 standard drink of alcohol    Types: 1 Glasses of wine per week    Comment: SOCIALLY   Drug use: No   Sexual activity: Yes    Partners: Male    Birth control/protection: Post-menopausal  Other Topics Concern   Not on file  Social History Narrative   She is originally from [place]. She has traveled to [place], [place], [place], [place], [place], [place], [place], [place], [place]. No international travel. She has dogs. No prior bird, mold, or recent hot tub exposure. She hasn't used her hot tub in 1.5 years. 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 Previously enjoyed gardening and playing tennis. Helps to care for her mother.   Social Determinants of Health   Financial Resource Strain: Low Risk  (09/07/2021)   Overall Financial Resource Strain (CARDIA)    Difficulty of Paying Living Expenses: Not hard at all  Food Insecurity: No Food Insecurity (01/05/2022)   Hunger Vital Sign    Worried About Running Out of Food in the Last Year: Never true    Ran Out of Food in the Last Year: Never true  Transportation Needs: No Transportation Needs (01/05/2022)   PRAPARE - Administrator, Civil Service (Medical): No    Lack of Transportation (Non-Medical): No  Physical Activity: Insufficiently Active (09/07/2021)   Exercise Vital Sign    Days of Exercise per Week: 2 days  Minutes of Exercise per Session: 30 min  Stress: No Stress Concern Present (09/07/2021)   [employer] of Occupational Health - Occupational Stress Questionnaire    Feeling of Stress : Only a little  Social Connections: Unknown (09/07/2021)   Social Connection and Isolation Panel [NHANES]    Frequency  of Communication with Friends and Family: More than three times a week    Frequency of Social Gatherings with Friends and Family: Three times a week    Attends Religious Services: 1 to 4 times per year    Active Member of Clubs or Organizations: Not on file    Attends Banker Meetings: Not on file    Marital Status: Divorced  Intimate Partner Violence: Not At Risk (01/05/2022)   Humiliation, Afraid, Rape, and Kick questionnaire    Fear of Current or Ex-Partner: No    Emotionally Abused: No    Physically Abused: No    Sexually Abused: No    Review of Systems:  All other review of systems negative except as mentioned in the HPI.  Physical Exam: Vital signs in last 24 hours: Blood Pressure 115/74   Pulse 90   Temperature 97.7 F (36.5 C)   Height 5\' 6"  (1.676 m)   Weight 175 lb (79.4 kg)   Last Menstrual Period 01/31/2007   Oxygen Saturation 97%   Body Mass Index 28.25 kg/m  General:   Alert, NAD Lungs:  Clear .   Heart:  Regular rate and rhythm Abdomen:  Soft, nontender and nondistended. Neuro/Psych:  Alert and cooperative. Normal mood and affect. A and O x 3  Reviewed labs, radiology imaging, old records and pertinent past GI work up  Patient is appropriate for planned procedure(s) and anesthesia in an ambulatory setting   K. Scherry Ran , MD [phone]

## 2022-07-12 NOTE — Op Note (Signed)
[facility] Patient Name: Tiffany Horn Procedure Date: 07/12/2022 9:31 AM MRN: [id number] Endoscopist: Napoleon Form , MD, [id number] Age: 62 Referring MD:  Date of Birth: 1960-10-20 Gender: Female Account #: [id number] Procedure:                Colonoscopy Indications:              High risk colon cancer surveillance: Personal                            history of colonic polyps, Surveillance: Piecemeal                            removal of large sessile adenoma last colonoscopy                            (< 3 yrs), High risk colon cancer surveillance:                            Personal history of adenoma (10 mm or greater in                            size), Screening in patient at increased risk:                            Family history of 1st-degree relative with                            colorectal cancer Medicines:                Monitored Anesthesia Care Procedure:                Pre-Anesthesia Assessment:                           - Prior to the procedure, a History and Physical                            was performed, and patient medications and                            allergies were reviewed. The patient's tolerance of                            previous anesthesia was also reviewed. The risks                            and benefits of the procedure and the sedation                            options and risks were discussed with the patient.                            All questions were answered, and informed consent  was obtained. Prior Anticoagulants: The patient                            last took Plavix (clopidogrel) 5 days prior to the                            procedure. ASA Grade Assessment: III - A patient                            with severe systemic disease. After reviewing the                            risks and benefits, the patient was deemed in                            satisfactory condition to undergo  the procedure.                           After obtaining informed consent, the colonoscope                            was passed under direct vision. Throughout the                            procedure, the patient's blood pressure, pulse, and                            oxygen saturations were monitored continuously. The                            [id number]-HQ190L Colonoscope [id number] was introduced                            through the anus and advanced to the the cecum,                            identified by appendiceal orifice and ileocecal                            valve. The colonoscopy was performed without                            difficulty. The patient tolerated the procedure                            well. The quality of the bowel preparation was                            good. The ileocecal valve, appendiceal orifice, and                            rectum were photographed. Scope In: 9:48:51 AM Scope Out: 10:04:57 AM Scope Withdrawal Time: 0 hours 8 minutes 8 seconds  Total Procedure Duration:  0 hours 16 minutes 6 seconds  Findings:                 The perianal and digital rectal examinations were                            normal.                           A 4 mm polyp was found in the transverse colon. The                            polyp was sessile. The polyp was removed with a                            cold snare. Resection and retrieval were complete.                           Non-bleeding external and internal hemorrhoids were                            found during retroflexion. The hemorrhoids were                            medium-sized.                           The exam was otherwise without abnormality. Complications:            No immediate complications. Estimated Blood Loss:     Estimated blood loss was minimal. Impression:               - One 4 mm polyp in the transverse colon, removed                            with a cold snare. Resected and retrieved.                            - Non-bleeding external and internal hemorrhoids.                           - The examination was otherwise normal. Recommendation:           - Patient has a contact number available for                            emergencies. The signs and symptoms of potential                            delayed complications were discussed with the                            patient. Return to normal activities tomorrow.                            Written discharge instructions were provided to the  patient.                           - Resume previous diet.                           - Continue present medications.                           - Await pathology results.                           - Repeat colonoscopy in 3 years for surveillance.                           - Resume Plavix (clopidogrel) at prior dose in 2                            days. Refer to managing physician for further                            adjustment of therapy. Napoleon Form, MD 07/12/2022 10:12:46 AM This report has been signed electronically.

## 2022-07-12 NOTE — Progress Notes (Signed)
Called to room to assist during endoscopic procedure.  Patient ID and intended procedure confirmed with present staff. Received instructions for my participation in the procedure from the performing physician.  

## 2022-07-13 ENCOUNTER — Telehealth: Payer: Self-pay | Admitting: *Deleted

## 2022-07-13 NOTE — Telephone Encounter (Signed)
  Follow up Call-     07/12/2022    8:59 AM 09/20/2020    8:25 AM  Call back number  Post procedure Call Back phone  # [phone]  Permission to leave phone message Yes Yes     Patient questions:  Do you have a fever, pain , or abdominal swelling? No. Pain Score  0 *  Have you tolerated food without any problems? Yes.    Have you been able to return to your normal activities? Yes.    Do you have any questions about your discharge instructions: Diet   No. Medications  No. Follow up visit  No.  Do you have questions or concerns about your Care? No.  Actions: * If pain score is 4 or above: No action needed, pain <4.

## 2022-07-17 ENCOUNTER — Ambulatory Visit: Payer: BC Managed Care – PPO

## 2022-07-17 ENCOUNTER — Telehealth: Payer: Self-pay

## 2022-07-17 NOTE — Telephone Encounter (Signed)
Spoke with patient regarding results/recommendations.  

## 2022-07-17 NOTE — Telephone Encounter (Signed)
Patient calling in regards to genetic testing results.  Patient states it has been a week and has not heard from anyone.  I told patient I was not sure how long it took to get results back from cheek swab.  Please advise [phone]

## 2022-07-18 ENCOUNTER — Ambulatory Visit (INDEPENDENT_AMBULATORY_CARE_PROVIDER_SITE_OTHER): Payer: BC Managed Care – PPO

## 2022-07-18 ENCOUNTER — Telehealth: Payer: Self-pay | Admitting: Gastroenterology

## 2022-07-18 DIAGNOSIS — R1310 Dysphagia, unspecified: Secondary | ICD-10-CM

## 2022-07-18 DIAGNOSIS — K219 Gastro-esophageal reflux disease without esophagitis: Secondary | ICD-10-CM

## 2022-07-18 DIAGNOSIS — E538 Deficiency of other specified B group vitamins: Secondary | ICD-10-CM

## 2022-07-18 MED ORDER — CYANOCOBALAMIN 1000 MCG/ML IJ SOLN
1000.0000 ug | Freq: Once | INTRAMUSCULAR | Status: AC
Start: 2022-07-18 — End: 2022-07-18
  Administered 2022-07-18: 1000 ug via INTRAMUSCULAR

## 2022-07-18 NOTE — Progress Notes (Unsigned)
Pt here for monthly B12 injection per Dr Kuneff. Today is her first dose which will be weekly  B12 1000mcg given IM, and pt tolerated injection well.  Next B12 injection scheduled for 1 week 

## 2022-07-18 NOTE — Telephone Encounter (Signed)
Spoke with the patient. Advised she will receive a letter after the pathology has been reviewed by the doctor. The patient actually had several concerns she wanted to discuss. She reports she has had issues with her swallowing which started about 2 months ago. She feels she must wait between bites for the food to "go down my throat." "It doesn't feel like it is going down like it should." She has not regurgitated or choked on her food. She does have reflux sometimes when she lies down. She has taken OTC Prilosec and TUMS. She does not feel like they help. Patient also is currently being treated by her PCP for "my thyroid being under attack."

## 2022-07-18 NOTE — Telephone Encounter (Signed)
Inbound call from requesting a call back to discuss colonoscopy results. Please advise, thank you.

## 2022-07-19 NOTE — Telephone Encounter (Signed)
Patient is aware and is scheduled for tomorrow morning.

## 2022-07-19 NOTE — Telephone Encounter (Signed)
Please call patient: We have received the [facility] results.  Can we please set her up for a virtual visit so that we can discuss which options are best for her with her current symptoms. I have an opening this afternoon and tomorrow morning

## 2022-07-20 ENCOUNTER — Encounter: Payer: Self-pay | Admitting: Family Medicine

## 2022-07-20 ENCOUNTER — Telehealth (INDEPENDENT_AMBULATORY_CARE_PROVIDER_SITE_OTHER): Payer: BC Managed Care – PPO | Admitting: Family Medicine

## 2022-07-20 DIAGNOSIS — F330 Major depressive disorder, recurrent, mild: Secondary | ICD-10-CM

## 2022-07-20 DIAGNOSIS — F419 Anxiety disorder, unspecified: Secondary | ICD-10-CM | POA: Diagnosis not present

## 2022-07-20 MED ORDER — DESVENLAFAXINE SUCCINATE ER 50 MG PO TB24
50.0000 mg | ORAL_TABLET | Freq: Every day | ORAL | 1 refills | Status: DC
Start: 2022-07-20 — End: 2022-09-05

## 2022-07-20 MED ORDER — BUSPIRONE HCL 5 MG PO TABS
5.0000 mg | ORAL_TABLET | Freq: Two times a day (BID) | ORAL | 1 refills | Status: DC
Start: 2022-07-20 — End: 2022-09-05

## 2022-07-20 NOTE — Patient Instructions (Signed)
Return in about 24 weeks (around 02/23/2022) for Routine chronic condition follow-up.        Great to see you today.  I have refilled the medication(s) we provide.   If labs were collected, we will inform you of lab results once received either by echart message or telephone call.   - echart message- for normal results that have been seen by the patient already.   - telephone call: abnormal results or if patient has not viewed results in their echart.  

## 2022-07-20 NOTE — Telephone Encounter (Signed)
Advised the patient I do not have feed back from the provider. I will call her as soon as I do.

## 2022-07-20 NOTE — Telephone Encounter (Signed)
Patient is calling to follow up on Dr Elana Alm recommendations. Please advise

## 2022-07-20 NOTE — Progress Notes (Signed)
Tiffany Horn , [birth year], 62 y.o., female MRN: [id number] Patient Care Team    Relationship Specialty Notifications Start End  Natalia Leatherwood, DO PCP - General Family Medicine  04/28/20   Jake Bathe, MD PCP - Cardiology Cardiology  03/01/20   Napoleon Form, MD Consulting Physician Gastroenterology  05/29/16   Verner Chol, CNM Referring Physician Certified Nurse Midwife  05/29/16   Ernestina Penna, MD Referring Physician Obstetrics and Gynecology  07/17/17   Josephine Igo, DO Consulting Physician Pulmonary Disease  06/12/19     Chief Complaint  Patient presents with   Depression     Subjective: Tiffany Horn is a  63 y.o. Pt presents for an OV to discuss results and further plan for depression treatment after genesight testing completed and resulted.    Prior note: with concerns of continued fatigue.  She reports compliance with her thyroid medication which has been under supplemented in the past from compliance.  She reports her asthma seems to be well-controlled.  Has a history of CVA.  She follows closely with her neurosurgical team.  She has a history of aneurysms as well.  She is wondering if she is lacking in any vitamins that could be creating her fatigue.  She has had B12 deficiency in the past and was on B12 injections. Reports she never feels rested however over the last month things have become "rough.  "She reports she just does not feel good she feels like something is wrong, "not right.  "She has been more moody and irritable. She is seeing a therapist and had her significant other go to therapy session with her to discuss his drinking patterns effects on the relationship.     07/05/2022    9:34 AM 09/13/2021    2:52 PM 04/12/2021    2:14 PM 03/07/2021    8:33 AM 11/25/2020    9:44 AM  Depression screen PHQ 2/9  Decreased Interest 3 1 1  0 0  Down, Depressed, Hopeless 3 0 1 0 0  PHQ - 2 Score 6 1 2  0 0  Altered sleeping 3 3 0    Tired, decreased  energy 3 2 0    Change in appetite 1 0 0    Feeling bad or failure about yourself  1 0 0    Trouble concentrating 2 2 0    Moving slowly or fidgety/restless 0 0 0    Suicidal thoughts 0 0 0    PHQ-9 Score 16 8 2     Difficult doing work/chores Somewhat difficult        Allergies  Allergen Reactions   Abilify [Aripiprazole] Nausea And Vomiting   Amoxil [Amoxicillin] Diarrhea   Promethazine Hcl Other (See Comments)    Made pt feel "weird"   Seroquel [Quetiapine Fumarate] Other (See Comments)    Pt does not recall reaction   Social History   Social History Narrative   She is originally from [place]. She has traveled to [facility], [place], [place], [place], [place], [place], [place], [place], [place]. No international travel. She has dogs. No prior bird, mold, or recent hot tub exposure. She hasn't used her hot tub in 1.5 years. 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 Previously enjoyed gardening and playing tennis. Helps to care for her mother.   Past Medical History:  Diagnosis Date   Allergy    Anxiety    Arthritis  knees and spine, shoulder   Asthma    Bronchitis    hx - recurrent   Complication of anesthesia    waking up is not easy   Depression    Elevated IgE level 09/12/2017   09/12/2017 IgE 195   GERD (gastroesophageal reflux disease)    History of COVID-19 12/30/2020   Hx of irritable bowel syndrome    x2   Hyperlipidemia    diet controlled - no medication   Hypertension    not taking any meds at present - under control per patient   Hypokalemia    with PNA admission (2.5)   Hypothyroidism (acquired)    Ischemic cerebrovascular accident (CVA) (HCC)    Migraines    Osteoporosis    Pneumonia    4 episodes; hosp. admission 2014   PONV (postoperative nausea and vomiting)    TIA (transient ischemic attack) 12/17/2020   UC (ulcerative colitis) [facility])    DX'D 2021   Past Surgical History:  Procedure Laterality Date   ANEURYSM COILING      STENT   BREAST SURGERY     implants, then had them removed   COLONOSCOPY     greater 10 yrs ago - ? [facility]-2017 LAST   DILATION AND CURETTAGE OF UTERUS     IR ANGIO INTRA EXTRACRAN SEL INTERNAL CAROTID BILAT MOD SED  11/19/2020   IR ANGIO VERTEBRAL SEL VERTEBRAL BILAT MOD SED  11/19/2020   IR ANGIO VERTEBRAL SEL VERTEBRAL UNI L MOD SED  02/25/2021   IR ANGIO VERTEBRAL SEL VERTEBRAL UNI L MOD SED  01/13/2022   IR ANGIOGRAM FOLLOW UP STUDY  11/19/2020   IR ANGIOGRAM FOLLOW UP STUDY  11/19/2020   IR ANGIOGRAM FOLLOW UP STUDY  11/19/2020   IR ANGIOGRAM FOLLOW UP STUDY  11/19/2020   IR ANGIOGRAM FOLLOW UP STUDY  11/19/2020   IR ANGIOGRAM FOLLOW UP STUDY  11/19/2020   IR ANGIOGRAM FOLLOW UP STUDY  11/19/2020   IR ANGIOGRAM FOLLOW UP STUDY  11/19/2020   IR INTRA CRAN STENT  11/19/2020   IR TRANSCATH/EMBOLIZ  11/19/2020   LAPAROSCOPIC ABDOMINAL EXPLORATION  01/31/1992   endometriosis   ORIF HUMERUS FRACTURE Left 04/01/2013   DR Magnus Ivan - shoulder   ORIF HUMERUS FRACTURE Left 04/01/2013   Procedure: OPEN REDUCTION INTERNAL FIXATION (ORIF) LEFT PROXIMAL HUMERUS FRACTURE;  Surgeon: Kathryne Hitch, MD;  Location: [facility] OR;  Service: Orthopedics;  Laterality: Left;   RADIOLOGY WITH ANESTHESIA N/A 11/19/2020   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;  Surgeon: Lisbeth Renshaw, MD;  Location: [facility] OR;  Service: Radiology;  Laterality: N/A;   TONSILLECTOMY AND ADENOIDECTOMY     Family History  Problem Relation Age of Onset   Breast cancer Mother    Diabetes Mother    Heart disease Mother    Diabetes Father    Dementia Father    Colon cancer Father    Stroke Paternal Grandmother    Diabetes Paternal Grandmother    Colon polyps Neg Hx    Esophageal cancer Neg Hx    Stomach cancer Neg Hx    Rectal cancer Neg Hx    Crohn's disease Neg Hx    Ulcerative colitis Neg Hx    Allergies as of 07/20/2022       Reactions   Abilify [aripiprazole] Nausea And Vomiting    Amoxil [amoxicillin] Diarrhea   Promethazine Hcl Other (See Comments)   Made pt feel "weird"   Seroquel [quetiapine Fumarate] Other (See Comments)  Pt does not recall reaction        Medication List        Accurate as of July 20, 2022 10:07 AM. If you have any questions, ask your nurse or doctor.          STOP taking these medications    PARoxetine 40 MG tablet Commonly known as: PAXIL Stopped by: Felix Pacini, DO       TAKE these medications    alendronate 70 MG tablet Commonly known as: FOSAMAX Take 1 tablet (70 mg total) by mouth every 7 (seven) days. Take with a full glass of water on an empty stomach.   amLODipine 10 MG tablet Commonly known as: NORVASC Take 1 tablet (10 mg total) by mouth daily.   ASCORBIC ACID PO Take 1 tablet by mouth every evening. Vitamin C, unknown strength.   aspirin EC 81 MG tablet Take 81 mg by mouth daily. Swallow whole.   atorvastatin 80 MG tablet Commonly known as: LIPITOR Take 1 tablet (80 mg total) by mouth daily.   Breztri Aerosphere 160-9-4.8 MCG/ACT Aero Generic drug: Budeson-Glycopyrrol-Formoterol Inhale 2 puffs into the lungs in the morning and at bedtime.   busPIRone 5 MG tablet Commonly known as: BUSPAR Take 1 tablet (5 mg total) by mouth 2 (two) times daily. Started by: Felix Pacini, DO   clonazePAM 0.5 MG tablet Commonly known as: KLONOPIN Take 1 tablet (0.5 mg total) by mouth 2 (two) times daily as needed for anxiety.   clopidogrel 75 MG tablet Commonly known as: PLAVIX Take 1 tablet (75 mg total) by mouth daily.   desvenlafaxine 50 MG 24 hr tablet Commonly known as: Pristiq Take 1 tablet (50 mg total) by mouth daily. Started by: Felix Pacini, DO   ezetimibe 10 MG tablet Commonly known as: Zetia Take 1 tablet (10 mg total) by mouth daily. What changed: when to take this   levothyroxine 150 MCG tablet Commonly known as: Synthroid Take 1 tablet (150 mcg total) by mouth daily before  breakfast.   OVER THE COUNTER MEDICATION daily. B VITAMINS   Vitamin D (Ergocalciferol) 1.25 MG (50000 UNIT) Caps capsule Commonly known as: DRISDOL Take 1 capsule (50,000 Units total) by mouth every 14 (fourteen) days.   VITAMIN D-3 PO Take 1 capsule by mouth daily.   ZINC PO Take by mouth daily.        All past medical history, surgical history, allergies, family history, immunizations andmedications were updated in the EMR today and reviewed under the history and medication portions of their EMR.     ROS Negative, with the exception of above mentioned in HPI   Objective:  LMP 01/31/2007  There is no height or weight on file to calculate BMI. Physical Exam Vitals and nursing note reviewed.  Constitutional:      General: She is not in acute distress.    Appearance: Normal appearance. She is not toxic-appearing.  HENT:     Head: Normocephalic and atraumatic.  Eyes:     General: No scleral icterus.       Right eye: No discharge.        Left eye: No discharge.     Conjunctiva/sclera: Conjunctivae normal.  Pulmonary:     Effort: Pulmonary effort is normal.  Musculoskeletal:     Cervical back: Normal range of motion.  Skin:    Findings: No rash.  Neurological:     Mental Status: She is alert and oriented to person, place, and time. Mental status is  at baseline.     Comments: Tearful.  Psychiatric:        Mood and Affect: Mood normal.        Behavior: Behavior normal.        Thought Content: Thought content normal.        Judgment: Judgment normal.    No results found. No results found. No results found for this or any previous visit (from the past 24 hour(s)).  Assessment/Plan: Tiffany Horn is a 62 y.o. female present for OV for  Major depressive disorder, recurrent episode, mild (HCC)/anxiety/moodiness After discussion on results and best options for patient, it was decided to: - taper off paxil down to 20 mg 2 weeks, then stop.  Start pristiq 50 mg  every day.  Start buspar 5 mg BID in 2-3 days after pristiq F/u 6 months - sooner if needed  Reviewed expectations re: course of current medical issues. Discussed self-management of symptoms. Outlined signs and symptoms indicating need for more acute intervention. Patient verbalized understanding and all questions were answered. Patient received an After-Visit Summary.   Dependent upon laboratory results and GeneSight testing results No orders of the defined types were placed in this encounter.  Meds ordered this encounter  Medications   desvenlafaxine (PRISTIQ) 50 MG 24 hr tablet    Sig: Take 1 tablet (50 mg total) by mouth daily.    Dispense:  90 tablet    Refill:  1   busPIRone (BUSPAR) 5 MG tablet    Sig: Take 1 tablet (5 mg total) by mouth 2 (two) times daily.    Dispense:  180 tablet    Refill:  1   Referral Orders  No referral(s) requested today     Note is dictated utilizing voice recognition software. Although note has been proof read prior to signing, occasional typographical errors still can be missed. If any questions arise, please do not hesitate to call for verification.   electronically signed by:  Felix Pacini, DO  [facility] - OR

## 2022-07-21 MED ORDER — PANTOPRAZOLE SODIUM 40 MG PO TBEC: 40.0000 mg | DELAYED_RELEASE_TABLET | Freq: Every day | ORAL | 3 refills | Status: AC

## 2022-07-21 NOTE — Telephone Encounter (Signed)
Called patient, she is experience new onset GERD symptoms and intermittent dysphagia.  Sent Rx for Pantoprazole 40mg  daily before dinner Schedule EGD next available appt at [facility] for further evaluation of symptoms  Discussed biopsy results.  Follow up in office visit after EGD  K. Scherry Ran , MD [phone]

## 2022-07-25 ENCOUNTER — Ambulatory Visit: Payer: BC Managed Care – PPO

## 2022-07-25 NOTE — Telephone Encounter (Signed)
I have spoken to patient and scheduled endoscopy in [facility] for 09/25/22 at 4 pm. Patient has been added to wait list for an earlier appointment as requested by patient. Prep instructions have been given over the phone and have been sent in detail thru mychart for her upcoming procedure.  Patient is on Plavix prescribed by Dr Conchita Paris. Plavix clearance request sent.   Patient is also scheduled for follow up office visit with Dr Lavon Paganini on 12/15/22, her next availability.  Patient verbalizes understanding.

## 2022-07-27 NOTE — Telephone Encounter (Addendum)
I have spoken to patient to advise that we have received a note from Dr Conchita Paris. She may hold plavix 5 days prior to her upcoming procedure 09/25/22. Patient verbalizes understanding of this information.   *see original clearance note scanned under "media" tab in EPIC, dated 07/26/22.

## 2022-08-07 ENCOUNTER — Ambulatory Visit (INDEPENDENT_AMBULATORY_CARE_PROVIDER_SITE_OTHER): Payer: BC Managed Care – PPO

## 2022-08-07 DIAGNOSIS — E538 Deficiency of other specified B group vitamins: Secondary | ICD-10-CM | POA: Diagnosis not present

## 2022-08-07 MED ORDER — CYANOCOBALAMIN 1000 MCG/ML IJ SOLN
1000.0000 ug | INTRAMUSCULAR | Status: DC
Start: 2022-08-07 — End: 2022-10-25
  Administered 2022-08-07 – 2022-10-13 (×3): 1000 ug via INTRAMUSCULAR

## 2022-08-07 NOTE — Progress Notes (Signed)
Pt here for monthly B12 injection per Dr Kuneff ? ?B12 1000mcg given IM, and pt tolerated injection well. ? ?Next B12 injection scheduled for 2 weeks ? ?

## 2022-08-10 ENCOUNTER — Telehealth: Payer: Self-pay | Admitting: Family Medicine

## 2022-08-10 MED ORDER — CLOPIDOGREL BISULFATE 75 MG PO TABS: 75.0000 mg | ORAL_TABLET | Freq: Every day | ORAL | 1 refills | Status: DC

## 2022-08-10 NOTE — Telephone Encounter (Signed)
Patient returned Chloe's call. Chloe was not available. I informed patient that Dr. Claiborne Billings filled Plavix for 90 d/s. Patient is aware going forward medication should be managed by her neurology team, Dr. Conchita Paris. She understood.  Patient's next appt with neurologist is not until Dec 2024 but she will call their office with any/all future refills regarding Plavix.

## 2022-08-10 NOTE — Telephone Encounter (Signed)
Please inform patient I did refill her Plavix, would not want her to be out of this medication long-term.   The doctor they requested refill from was/is a hospitalist and he filled this medication on her hospital discharge last year.  The provider that typically fills long-term, is the provider the patient follows with, for that specific condition, which is her neurology team-Dr Conchita Paris.   -Please tell her I refilled the Plavix for 90 days with a refill, but the next time she follows with her neurology team she should make sure they fill for her.  Hope she has a good vacation

## 2022-08-10 NOTE — Telephone Encounter (Signed)
Left pt VM

## 2022-08-10 NOTE — Telephone Encounter (Signed)
Patient is leaving the country tomorrow and in need of her Clopidogrel(Plavix) to be filled. John the pharmacist at [facility] has sent several messages to the prescribing provider Gherghe with no success. She is asking that Dr. Claiborne Billings write a new prescription for this because she has been without it for several weeks now. John the pharmacist advised her to contact her PCP to get this filled. Please contact Daney to discuss if this can be approved.

## 2022-08-10 NOTE — Telephone Encounter (Signed)
Noted! Thank you

## 2022-08-17 ENCOUNTER — Other Ambulatory Visit: Payer: Self-pay | Admitting: Family Medicine

## 2022-08-21 ENCOUNTER — Emergency Department (HOSPITAL_COMMUNITY)
Admission: EM | Admit: 2022-08-21 | Discharge: 2022-08-21 | Disposition: A | Discharge status: Home / Self Care | Payer: BC Managed Care – PPO | Attending: Emergency Medicine | Admitting: Emergency Medicine

## 2022-08-21 ENCOUNTER — Emergency Department (HOSPITAL_COMMUNITY): Payer: BC Managed Care – PPO

## 2022-08-21 ENCOUNTER — Other Ambulatory Visit: Payer: Self-pay

## 2022-08-21 DIAGNOSIS — I10 Essential (primary) hypertension: Secondary | ICD-10-CM | POA: Diagnosis not present

## 2022-08-21 DIAGNOSIS — Z7982 Long term (current) use of aspirin: Secondary | ICD-10-CM | POA: Diagnosis not present

## 2022-08-21 DIAGNOSIS — Z79899 Other long term (current) drug therapy: Secondary | ICD-10-CM | POA: Diagnosis not present

## 2022-08-21 DIAGNOSIS — Z7902 Long term (current) use of antithrombotics/antiplatelets: Secondary | ICD-10-CM | POA: Insufficient documentation

## 2022-08-21 DIAGNOSIS — G43809 Other migraine, not intractable, without status migrainosus: Secondary | ICD-10-CM | POA: Diagnosis not present

## 2022-08-21 DIAGNOSIS — R791 Abnormal coagulation profile: Secondary | ICD-10-CM | POA: Insufficient documentation

## 2022-08-21 DIAGNOSIS — R519 Headache, unspecified: Secondary | ICD-10-CM | POA: Diagnosis present

## 2022-08-21 LAB — ETHANOL: Alcohol, Ethyl (B): <10 mg/dL (ref <10)

## 2022-08-21 LAB — COMPREHENSIVE METABOLIC PANEL
ALT: 21 U/L (ref 0–44)
AST: 18 U/L (ref 15–41)
Albumin: 4 g/dL (ref 3.5–5.0)
Alkaline Phosphatase: 70 U/L (ref 38–126)
Anion gap: 9 (ref 5–15)
BUN: 9 mg/dL (ref 6–20)
CO2: 25 mmol/L (ref 22–32)
Calcium: 9.4 mg/dL (ref 8.9–10.3)
Chloride: 104 mmol/L (ref 98–111)
Creatinine, Ser: 0.7 mg/dL (ref 0.44–1.00)
GFR, Estimated: >60 mL/min (ref >60)
Glucose, Bld: 96 mg/dL (ref 70–99)
Potassium: 3.6 mmol/L (ref 3.5–5.1)
Sodium: 138 mmol/L (ref 135–145)
Total Bilirubin: 0.2 mg/dL — ABNORMAL LOW (ref 0.3–1.2)
Total Protein: 7.2 g/dL (ref 6.5–8.1)

## 2022-08-21 LAB — CBC
HCT: 43.9 % (ref 36.0–46.0)
Hemoglobin: 14.4 g/dL (ref 12.0–15.0)
MCH: 29.8 pg (ref 26.0–34.0)
MCHC: 32.8 g/dL (ref 30.0–36.0)
MCV: 90.7 fL (ref 80.0–100.0)
Platelets: 244 10*3/uL (ref 150–400)
RBC: 4.84 MIL/uL (ref 3.87–5.11)
RDW: 12.5 % (ref 11.5–15.5)
WBC: 6 10*3/uL (ref 4.0–10.5)
nRBC: 0 % (ref 0.0–0.2)

## 2022-08-21 LAB — DIFFERENTIAL
Abs Immature Granulocytes: 0.01 10*3/uL (ref 0.00–0.07)
Basophils Absolute: 0.1 10*3/uL (ref 0.0–0.1)
Basophils Relative: 1 %
Eosinophils Absolute: 0.2 10*3/uL (ref 0.0–0.5)
Eosinophils Relative: 3 %
Immature Granulocytes: 0 %
Lymphocytes Relative: 29 %
Lymphs Abs: 1.7 10*3/uL (ref 0.7–4.0)
Monocytes Absolute: 0.5 10*3/uL (ref 0.1–1.0)
Monocytes Relative: 8 %
Neutro Abs: 3.5 10*3/uL (ref 1.7–7.7)
Neutrophils Relative %: 59 %

## 2022-08-21 LAB — I-STAT CHEM 8, ED
BUN: 6 mg/dL (ref 6–20)
Calcium, Ion: 0.92 mmol/L — ABNORMAL LOW (ref 1.15–1.40)
Chloride: 113 mmol/L — ABNORMAL HIGH (ref 98–111)
Creatinine, Ser: 0.4 mg/dL — ABNORMAL LOW (ref 0.44–1.00)
Glucose, Bld: 68 mg/dL — ABNORMAL LOW (ref 70–99)
HCT: 30 % — ABNORMAL LOW (ref 36.0–46.0)
Hemoglobin: 10.2 g/dL — ABNORMAL LOW (ref 12.0–15.0)
Potassium: 2.5 mmol/L — CL (ref 3.5–5.1)
Sodium: 147 mmol/L — ABNORMAL HIGH (ref 135–145)
TCO2: 17 mmol/L — ABNORMAL LOW (ref 22–32)

## 2022-08-21 LAB — CBG MONITORING, ED: Glucose-Capillary: 93 mg/dL (ref 70–99)

## 2022-08-21 LAB — APTT: aPTT: 24 seconds (ref 24–36)

## 2022-08-21 LAB — PROTIME-INR
INR: 1.1 (ref 0.8–1.2)
Prothrombin Time: 13.9 seconds (ref 11.4–15.2)

## 2022-08-21 MED ORDER — MORPHINE SULFATE (PF) 2 MG/ML IV SOLN: 2.0000 mg | Freq: Once | INTRAVENOUS | Status: DC

## 2022-08-21 MED ORDER — ONDANSETRON HCL 4 MG/2ML IJ SOLN
4.0000 mg | Freq: Once | INTRAMUSCULAR | Status: AC
  Administered 2022-08-21: 4 mg via INTRAVENOUS
  Filled 2022-08-21: qty 2

## 2022-08-21 MED ORDER — DIPHENHYDRAMINE HCL 50 MG/ML IJ SOLN
25.0000 mg | Freq: Once | INTRAMUSCULAR | Status: AC
  Administered 2022-08-21: 25 mg via INTRAVENOUS
  Filled 2022-08-21: qty 1

## 2022-08-21 MED ORDER — LORAZEPAM 1 MG PO TABS: 1.0000 mg | ORAL_TABLET | Freq: Once | ORAL | Status: DC

## 2022-08-21 MED ORDER — SODIUM CHLORIDE 0.9 % IV BOLUS
1000.0000 mL | Freq: Once | INTRAVENOUS | Status: AC
  Administered 2022-08-21: 1000 mL via INTRAVENOUS

## 2022-08-21 MED ORDER — ONDANSETRON HCL 4 MG/2ML IJ SOLN: 4.0000 mg | Freq: Once | INTRAMUSCULAR | Status: DC

## 2022-08-21 MED ORDER — IOHEXOL 350 MG/ML SOLN
75.0000 mL | Freq: Once | INTRAVENOUS | Status: AC | PRN
  Administered 2022-08-21: 75 mL via INTRAVENOUS

## 2022-08-21 NOTE — ED Notes (Signed)
PA at bedside.

## 2022-08-21 NOTE — ED Notes (Signed)
Pt ambulated to bathroom without assistance 

## 2022-08-21 NOTE — ED Notes (Signed)
Stemi called at [facility] 

## 2022-08-21 NOTE — Discharge Instructions (Addendum)
You were seen in the emergency department today for headache and hand numbness. Imaging shows no evidence of stroke. We would like you to follow-up with our outpatient neurology services. I have sent an ambulatory referral to [facility]. If they do not call within 3 business days, I have provided you their number to call and schedule a follow-up appointment. Please return for worsening symptoms.

## 2022-08-21 NOTE — Code Documentation (Addendum)
Tiffany Horn is a 62 yr old female presenting to [facility] on 08/21/2022. She has a PMH stroke, TIA, and coiled basilar artery aneurysm, as well as migraine.  Pt is from home where she has been nauseated, with dizziness and h/a for several days. Today at 10:30, her Left hand became numb. Code stroke activated by Dr. Jacqulyn Bath in ED . Pt is on plavix.    Stroke team met pt en route to CT scan. Labs/ CBG have been obtained. Airway cleared by EDP. NIHSS 2. Pt with left sided sensory loss, dizziness, h/a. She answered the month orientation question wrong. The following imaging was obtained: CT, CTA. Per Dr Wilford Corner, CT head with no evidence of ICH. CTA obtained. Per Dr. Wilford Corner, CTA is neg for LVO. Stat MRI then obtained to ensure no stroke in progress. Per Dr. Wilford Corner, MRI is negative for stroke.     After MRI, pt will be returned to ED room 22. Further management per EDP. Bedside handoff with ED Paramedic complete. Stroke code cancelled.

## 2022-08-21 NOTE — ED Notes (Signed)
Patient transported to CT 

## 2022-08-21 NOTE — ED Notes (Signed)
Patient transported to MRI 

## 2022-08-21 NOTE — ED Provider Triage Note (Signed)
Emergency Medicine Provider Triage Evaluation Note  Tiffany Horn , a 62 y.o. female  was evaluated in triage.  Pt complains of migraine, nausea, and dizzinessx3 days. Took multiple medications w/o relief. Reports took BP this AM and BP was 161/101. Reports tingling on L side of hand and increased weakness starting at 1030 today.   Review of Systems  Positive: numbness Negative: Chest pain  Physical Exam  BP (!) 118/100 (BP Location: Right Arm)   Pulse 80   Temp 98.2 F (36.8 C)   Resp 18   Ht 5\' 6"  (1.676 m)   Wt 81.6 kg   LMP 01/31/2007   SpO2 100%   BMI 29.05 kg/m  Gen:   Awake, no distress   Resp:  Normal effort  MSK:   Moves extremities without difficulty  Other:  Decreased sensation and increased weakness of LUE. No facial droop.   Medical Decision Making  Medically screening exam initiated at 1:21 PM.  Appropriate orders placed.  Tiffany Horn was informed that the remainder of the evaluation will be completed by another provider, this initial triage assessment does not replace that evaluation, and the importance of remaining in the ED until their evaluation is complete.  CODE STROKE Called given new neurodeficits and hx.   Tiffany Horn, [place] 08/21/22 1329

## 2022-08-21 NOTE — Consult Note (Addendum)
Neurology Consultation  Reason for Consult: Code stroke  Referring Physician: Dr. Jacqulyn Bath  CC: headache for 3 days left hand numbness    History is obtained from:patient and medical record   HPI: Tiffany Horn is a 62 y.o. female with past medical history og, HTN, HDL, coiled basilar tip aneurysm, hypothyroidism, GERD migraines, cva and TIA, anxiety and depression who presents to ED for ongoing headache for the last 3 days, nausea, dizziness and left hand numbness that started around 1030 this morning. She also c/o photophobia. Code stroke was called by Dr. Jacqulyn Bath. CT head no acute process. CTA head and neck with no LVO. NIHSS 1. Patient taken to MRI STAT for treatment decision. MRI brain negative for acute process. Code stroke cancelled by Dr. Wilford Corner.   LKW: 1030 IV thrombolysis given?: no, not a stroke  EVT:  No LVO Premorbid modified Rankin scale (mRS):  0-Completely asymptomatic and back to baseline post-stroke   ROS: Full ROS was performed and is negative except as noted in the HPI.    Past Medical History:  Diagnosis Date   Allergy    Anxiety    Arthritis    knees and spine, shoulder   Asthma    Bronchitis    hx - recurrent   Complication of anesthesia    waking up is not easy   Depression    Elevated IgE level 09/12/2017   09/12/2017 IgE 195   GERD (gastroesophageal reflux disease)    History of COVID-19 12/30/2020   Hx of irritable bowel syndrome    x2   Hyperlipidemia    diet controlled - no medication   Hypertension    not taking any meds at present - under control per patient   Hypokalemia    with PNA admission (2.5)   Hypothyroidism (acquired)    Ischemic cerebrovascular accident (CVA) (HCC)    Migraines    Osteoporosis    Pneumonia    4 episodes; hosp. admission 2014   PONV (postoperative nausea and vomiting)    TIA (transient ischemic attack) 12/17/2020   UC (ulcerative colitis) [facility])    DX'D 2021     Family History  Problem Relation Age of Onset    Breast cancer Mother    Diabetes Mother    Heart disease Mother    Diabetes Father    Dementia Father    Colon cancer Father    Stroke Paternal Grandmother    Diabetes Paternal Grandmother    Colon polyps Neg Hx    Esophageal cancer Neg Hx    Stomach cancer Neg Hx    Rectal cancer Neg Hx    Crohn's disease Neg Hx    Ulcerative colitis Neg Hx      Social History:   reports that she has never smoked. She has never been exposed to tobacco smoke. She has never used smokeless tobacco. She reports current alcohol use of about 1.0 standard drink of alcohol per week. She reports that she does not use drugs.  Medications  Current Facility-Administered Medications:    cyanocobalamin (VITAMIN B12) injection 1,000 mcg, 1,000 mcg, Intramuscular, Q14 Days, Kuneff, Renee A, DO, 1,000 mcg at 08/07/22 1540  Current Outpatient Medications:    alendronate (FOSAMAX) 70 MG tablet, Take 1 tablet (70 mg total) by mouth every 7 (seven) days. Take with a full glass of water on an empty stomach., Disp: 4 tablet, Rfl: 11   amLODipine (NORVASC) 10 MG tablet, TAKE 1 TABLET BY MOUTH EVERY DAY, Disp: 90  tablet, Rfl: 1   ASCORBIC ACID PO, Take 1 tablet by mouth every evening. Vitamin C, unknown strength., Disp: , Rfl:    aspirin EC 81 MG tablet, Take 81 mg by mouth daily. Swallow whole., Disp: , Rfl:    atorvastatin (LIPITOR) 80 MG tablet, Take 1 tablet (80 mg total) by mouth daily., Disp: 90 tablet, Rfl: 1   Budeson-Glycopyrrol-Formoterol (BREZTRI AEROSPHERE) 160-9-4.8 MCG/ACT AERO, Inhale 2 puffs into the lungs in the morning and at bedtime., Disp: 5.9 g, Rfl: 1   busPIRone (BUSPAR) 5 MG tablet, Take 1 tablet (5 mg total) by mouth 2 (two) times daily., Disp: 180 tablet, Rfl: 1   Cholecalciferol (VITAMIN D-3 PO), Take 1 capsule by mouth daily., Disp: , Rfl:    clonazePAM (KLONOPIN) 0.5 MG tablet, Take 1 tablet (0.5 mg total) by mouth 2 (two) times daily as needed for anxiety., Disp: 180 tablet, Rfl: 1    clopidogrel (PLAVIX) 75 MG tablet, Take 1 tablet (75 mg total) by mouth daily., Disp: 90 tablet, Rfl: 1   desvenlafaxine (PRISTIQ) 50 MG 24 hr tablet, Take 1 tablet (50 mg total) by mouth daily., Disp: 90 tablet, Rfl: 1   ezetimibe (ZETIA) 10 MG tablet, Take 1 tablet (10 mg total) by mouth daily. (Patient taking differently: Take 10 mg by mouth every evening.), Disp: 90 tablet, Rfl: 3   levothyroxine (SYNTHROID) 150 MCG tablet, Take 1 tablet (150 mcg total) by mouth daily before breakfast., Disp: 90 tablet, Rfl: 1   Multiple Vitamins-Minerals (ZINC PO), Take by mouth daily., Disp: , Rfl:    OVER THE COUNTER MEDICATION, daily. B VITAMINS, Disp: , Rfl:    pantoprazole (PROTONIX) 40 MG tablet, Take 1 tablet (40 mg total) by mouth daily., Disp: 90 tablet, Rfl: 3   Vitamin D, Ergocalciferol, (DRISDOL) 1.25 MG (50000 UNIT) CAPS capsule, Take 1 capsule (50,000 Units total) by mouth every 14 (fourteen) days., Disp: 6 capsule, Rfl: 3   Exam: Current vital signs: BP (!) 118/100 (BP Location: Right Arm)   Pulse 80   Temp 98.2 F (36.8 C)   Resp 18   Ht 5\' 6"  (1.676 m)   Wt 81.6 kg   LMP 01/31/2007   SpO2 100%   BMI 29.05 kg/m  Vital signs in last 24 hours: Temp:  [98.2 F (36.8 C)] 98.2 F (36.8 C) (07/22 1303) Pulse Rate:  [80] 80 (07/22 1303) Resp:  [18] 18 (07/22 1303) BP: (118)/(100) 118/100 (07/22 1303) SpO2:  [100 %] 100 % (07/22 1303) Weight:  [81.6 kg] 81.6 kg (07/22 1305)  GENERAL: Awake, alert in NAD HEENT: - Normocephalic and atraumatic, dry mm LUNGS - Clear to auscultation bilaterally with no wheezes CV - S1S2 RRR, no m/r/g, equal pulses bilaterally. ABDOMEN - Soft, nontender, nondistended with normoactive BS Ext: warm, well perfused, intact peripheral pulses, no edema  NEURO:  Mental Status: AA&Ox4 Language: speech is clear.  Naming, repetition, fluency, and comprehension intact. Cranial Nerves: PERRL EOMI, visual fields full, no facial asymmetry, facial sensation intact,  hearing intact, tongue/uvula/soft palate midline, normal sternocleidomastoid and trapezius muscle strength. No evidence of tongue atrophy or fibrillations Motor: 5/5 in all 4 extremities  Tone: is normal and bulk is normal Sensation- decreased in left hand  Coordination: FTN intact bilaterally, no ataxia in BLE. Gait- deferred  NIHSS 1a Level of Conscious.: 0 1b LOC Questions: 0 1c LOC Commands: 0 2 Best Gaze: 0 3 Visual: 0 4 Facial Palsy: 0 5a Motor Arm - left: 0 5b Motor Arm -  Right: 0 6a Motor Leg - Left: 0 6b Motor Leg - Right: 0 7 Limb Ataxia: 0 8 Sensory: 1 9 Best Language: 0 10 Dysarthria: 0 11 Extinct. and Inatten.: 0 TOTAL: 0   Labs I have reviewed labs in epic and the results pertinent to this consultation are:  CBC    Component Value Date/Time   WBC 4.6 07/05/2022 1012   RBC 4.78 07/05/2022 1012   HGB 10.2 (L) 08/21/2022 1341   HCT 30.0 (L) 08/21/2022 1341   PLT 244.0 07/05/2022 1012   MCV 91.2 07/05/2022 1012   MCV 91.9 03/14/2013 1042   MCH 31.3 01/04/2022 0947   MCHC 33.2 07/05/2022 1012   RDW 13.3 07/05/2022 1012   LYMPHSABS 1.7 07/05/2022 1012   MONOABS 0.4 07/05/2022 1012   EOSABS 0.4 07/05/2022 1012   BASOSABS 0.1 07/05/2022 1012    CMP     Component Value Date/Time   NA 147 (H) 08/21/2022 1341   K 2.5 (LL) 08/21/2022 1341   CL 113 (H) 08/21/2022 1341   CO2 22 07/05/2022 1012   GLUCOSE 68 (L) 08/21/2022 1341   BUN 6 08/21/2022 1341   CREATININE 0.40 (L) 08/21/2022 1341   CREATININE 0.92 06/06/2019 1435   CALCIUM 9.4 07/05/2022 1012   PROT 7.2 07/05/2022 1012   ALBUMIN 4.5 07/05/2022 1012   AST 17 07/05/2022 1012   ALT 15 07/05/2022 1012   ALKPHOS 69 07/05/2022 1012   BILITOT 0.5 07/05/2022 1012   GFRNONAA >60 01/04/2022 0947   GFRNONAA 76 09/29/2016 1537   GFRAA >60 10/19/2019 1208   GFRAA 87 09/29/2016 1537    Lipid Panel     Component Value Date/Time   CHOL 241 (H) 01/05/2022 0415   TRIG 156 (H) 01/05/2022 0415   HDL 52  01/05/2022 0415   CHOLHDL 4.6 01/05/2022 0415   VLDL 31 01/05/2022 0415   LDLCALC 158 (H) 01/05/2022 0415   LDLCALC 172 (H) 05/21/2019 1442   LDLDIRECT 165 (H) 09/13/2021 1501    Lab Results  Component Value Date   HGBA1C 5.7 (H) 01/05/2022      Imaging I have reviewed the images obtained:  CT-head no acute process. Official read pending   CTA head and neck - no LVO   MRI examination of the brain no acute process Official read pending   Assessment:  63 y.o. female with past medical history og, HTN, HDL, coiled basilar tip aneurysm, hypothyroidism, GERD migraines, cva and TIA, anxiety and depression who presents to ED for ongoing headache for the last 3 days, nausea, dizziness and left hand numbness that started around 1030 this morning.   Impression:  Complicated Migraine   Recommendations: Migraine cocktail  Outpatient follow up with neurology for migraine treatment  Neurology will sign off. 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  Last known well 10:30 AM. On examination, she has NIH stroke scale of 1 due to sensory differences on the left hand.  CT head and CT angio head and neck unremarkable for acute process. Because of her prior strokes and complicated vascular history, she was  taken for stat MRI-negative for stroke. Symptoms likely related to complex migraine. Treat with migraine cocktail. Follow-up with outpatient neurology Plan was discussed with Dr. Clarice Pole.   -- Milon Dikes, MD Neurologist [facility] Pager: [phone]

## 2022-08-21 NOTE — ED Provider Notes (Addendum)
[facility] AT [facility] Provider Note   CSN: [id number] Arrival date & time: 08/21/22  1301     History  Chief Complaint  Patient presents with   Code Stroke    DENIESHA STENGLEIN is a 62 y.o. female. With past medical history of anxiety, hypertension, stroke, basilar artery aneurysm s/p coiling who presents to the emergency department for headache.  States symptoms began on Friday. She states she began having headache, dizziness and nausea. States symptoms progressively worsened over the weekend. She describes room spinning sensation. This only improves with laying flat. She notes that today she had ongoing headache but began having numbness in her left hand around 1030AM. Began also having photophobia. Denies numbness or tingling elsewhere. Denies weakness to her extremities. Denies slurred speech, changes to her vision.   HPI     Home Medications Prior to Admission medications   Medication Sig Start Date End Date Taking? Authorizing Provider  alendronate (FOSAMAX) 70 MG tablet Take 1 tablet (70 mg total) by mouth every 7 (seven) days. Take with a full glass of water on an empty stomach. 03/22/22   Kuneff, Renee A, DO  amLODipine (NORVASC) 10 MG tablet TAKE 1 TABLET BY MOUTH EVERY DAY 08/17/22   Kuneff, Renee A, DO  ASCORBIC ACID PO Take 1 tablet by mouth every evening. Vitamin C, unknown strength.    [provider]  aspirin EC 81 MG tablet Take 81 mg by mouth daily. Swallow whole.    [provider]  atorvastatin (LIPITOR) 80 MG tablet Take 1 tablet (80 mg total) by mouth daily. 01/27/22   Kuneff, Renee A, DO  Budeson-Glycopyrrol-Formoterol (BREZTRI AEROSPHERE) 160-9-4.8 MCG/ACT AERO Inhale 2 puffs into the lungs in the morning and at bedtime. 04/28/20   Icard, Rachel Bo, DO  busPIRone (BUSPAR) 5 MG tablet Take 1 tablet (5 mg total) by mouth 2 (two) times daily. 07/20/22   Kuneff, Renee A, DO  Cholecalciferol (VITAMIN D-3 PO) Take 1  capsule by mouth daily.    [provider]  clonazePAM (KLONOPIN) 0.5 MG tablet Take 1 tablet (0.5 mg total) by mouth 2 (two) times daily as needed for anxiety. 01/27/22   Kuneff, Renee A, DO  clopidogrel (PLAVIX) 75 MG tablet Take 1 tablet (75 mg total) by mouth daily. 08/10/22   Kuneff, Renee A, DO  desvenlafaxine (PRISTIQ) 50 MG 24 hr tablet Take 1 tablet (50 mg total) by mouth daily. 07/20/22   Kuneff, Renee A, DO  ezetimibe (ZETIA) 10 MG tablet Take 1 tablet (10 mg total) by mouth daily. Patient taking differently: Take 10 mg by mouth every evening. 09/16/21   Kuneff, Renee A, DO  levothyroxine (SYNTHROID) 150 MCG tablet Take 1 tablet (150 mcg total) by mouth daily before breakfast. 07/07/22   Kuneff, Renee A, DO  Multiple Vitamins-Minerals (ZINC PO) Take by mouth daily.    [provider]  OVER THE COUNTER MEDICATION daily. B VITAMINS    [provider]  pantoprazole (PROTONIX) 40 MG tablet Take 1 tablet (40 mg total) by mouth daily. 07/21/22   Napoleon Form, MD  Vitamin D, Ergocalciferol, (DRISDOL) 1.25 MG (50000 UNIT) CAPS capsule Take 1 capsule (50,000 Units total) by mouth every 14 (fourteen) days. 07/06/22   Kuneff, Renee A, DO      Allergies    Abilify [aripiprazole], Amoxil [amoxicillin], Promethazine hcl, and Seroquel [quetiapine fumarate]    Review of Systems   Review of Systems  Neurological:  Positive for  dizziness, numbness and headaches.  All other systems reviewed and are negative.   Physical Exam Updated Vital Signs BP 134/84   Pulse 67   Temp 98.9 F (37.2 C) (Oral)   Resp 10   Ht 5\' 6"  (1.676 m)   Wt 81.6 kg   LMP 01/31/2007   SpO2 99%   BMI 29.05 kg/m  Physical Exam Vitals and nursing note reviewed.  Constitutional:      Appearance: Normal appearance.  HENT:     Head: Normocephalic.  Eyes:     General: No visual field deficit or scleral icterus. Cardiovascular:     Rate and Rhythm: Normal rate and regular rhythm.      Pulses: Normal pulses.     Heart sounds: No murmur heard. Pulmonary:     Effort: Pulmonary effort is normal. No respiratory distress.     Breath sounds: Normal breath sounds.  Abdominal:     General: Bowel sounds are normal.     Palpations: Abdomen is soft.  Skin:    General: Skin is warm and dry.     Capillary Refill: Capillary refill takes less than 2 seconds.  Neurological:     General: No focal deficit present.     Mental Status: She is alert and oriented to person, place, and time. Mental status is at baseline.     GCS: GCS eye subscore is 4. GCS verbal subscore is 5. GCS motor subscore is 6.     Cranial Nerves: No cranial nerve deficit, dysarthria or facial asymmetry.     Sensory: Sensation is intact. No sensory deficit.     Motor: Motor function is intact. No weakness.     Coordination: Heel to Shin Test normal.  Psychiatric:        Mood and Affect: Mood normal.        Behavior: Behavior normal.        Thought Content: Thought content normal.        Judgment: Judgment normal.     ED Results / Procedures / Treatments   Labs (all labs ordered are listed, but only abnormal results are displayed) Labs Reviewed  COMPREHENSIVE METABOLIC PANEL - Abnormal; Notable for the following components:      Result Value   Total Bilirubin 0.2 (*)    All other components within normal limits  I-STAT CHEM 8, ED - Abnormal; Notable for the following components:   Sodium 147 (*)    Potassium 2.5 (*)    Chloride 113 (*)    Creatinine, Ser 0.40 (*)    Glucose, Bld 68 (*)    Calcium, Ion 0.92 (*)    TCO2 17 (*)    Hemoglobin 10.2 (*)    HCT 30.0 (*)    All other components within normal limits  ETHANOL  PROTIME-INR  APTT  CBC  DIFFERENTIAL  RAPID URINE DRUG SCREEN, HOSP PERFORMED  URINALYSIS, ROUTINE W REFLEX MICROSCOPIC  CBG MONITORING, ED  I-STAT CHEM 8, ED    EKG None  Radiology MR BRAIN WO CONTRAST  Result Date: 08/21/2022 CLINICAL DATA:  Left hand numbness and  headache. EXAM: MRI HEAD WITHOUT CONTRAST TECHNIQUE: Multiplanar, multiecho pulse sequences of the brain and surrounding structures were obtained without intravenous contrast. COMPARISON:  Same-day CT/CTA head and neck, brain MRI 01/04/2022 FINDINGS: Brain: There is no acute intracranial hemorrhage, extra-axial fluid collection, or acute infarct. Parenchymal volume is normal. The ventricles are normal in size. There is a single punctate chronic microhemorrhage in the right occipital lobe,  unchanged and nonspecific. Parenchymal signal is otherwise normal. The pituitary and suprasellar region are normal. There is no mass lesion. There is no mass effect or midline shift. Vascular: The major flow voids are present. There is signal dropout related to prior stent assisted coiling of a basilar tip aneurysm. Skull and upper cervical spine: Normal marrow signal. Sinuses/Orbits: The paranasal sinuses are clear. The globes and orbits are unremarkable. Other: Mastoids IMPRESSION: No acute intracranial pathology. Electronically Signed   By: Lesia Hausen M.D.   On: 08/21/2022 14:30   CT HEAD CODE STROKE WO CONTRAST  Result Date: 08/21/2022 CLINICAL DATA:  Code stroke.  Left hand numbness and headache. EXAM: CT ANGIOGRAPHY HEAD AND NECK TECHNIQUE: Multidetector CT imaging of the head and neck was performed using the standard protocol during bolus administration of intravenous contrast. Multiplanar CT image reconstructions and MIPs were obtained to evaluate the vascular anatomy. Carotid stenosis measurements (when applicable) are obtained utilizing NASCET criteria, using the distal internal carotid diameter as the denominator. RADIATION DOSE REDUCTION: This exam was performed according to the departmental dose-optimization program which includes automated exposure control, adjustment of the mA and/or kV according to patient size and/or use of iterative reconstruction technique. CONTRAST:  75mL OMNIPAQUE IOHEXOL 350 MG/ML SOLN  COMPARISON:  CT/CTA head and neck 01/04/2022 FINDINGS: CT HEAD FINDINGS Brain: There is no evidence of acute intracranial hemorrhage, extra-axial fluid collection, or acute infarct. Background parenchymal volume is normal. The ventricles are normal in size. Gray-white differentiation is preserved There is no mass lesion there is no mass effect or midline shift. The pituitary and suprasellar region are normal. Vascular: There is significant streak artifact from a basilar tip aneurysm coil pack. Skull: Normal. Negative for fracture or focal lesion. Sinuses/Orbits: The paranasal sinuses are clear. The globes and orbits are unremarkable. Other: The mastoid air cells and middle ear cavities are clear. 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 The imaged aortic arch is otherwise unremarkable. The origins of the major branch vessels are patent. The subclavian arteries are patent to the level imaged. Right carotid system: The right common, internal, and external carotid arteries are patent, without hemodynamically significant stenosis or occlusion there is no evidence of dissection or aneurysm. Left carotid system: The left common, internal, and external carotid arteries are patent, with mild plaque at the bifurcation but no hemodynamically significant stenosis or occlusion there is no evidence of dissection or aneurysm. Vertebral arteries: The vertebral arteries are patent, without hemodynamically significant stenosis or occlusion there is no evidence of dissection or aneurysm. Skeleton: There is no acute osseous abnormality or suspicious osseous lesion. There is mild degenerative change of the cervical spine.  There is no visible canal hematoma. Other neck: The soft tissues of the neck are unremarkable. Upper chest: The imaged lung apices are clear. Review of the MIP images confirms the above findings CTA HEAD FINDINGS Anterior circulation: The intracranial ICAs are patent, without stenosis or occlusion. The bilateral MCAs and ACAS are patent, without proximal stenosis or occlusion. The anterior communicating artery is normal There is no aneurysm or AVM. Posterior circulation: The bilateral V4 segments are patent. The patient is status post stent assisted coil embolization of the basilar tip aneurysm. Streak artifact precludes evaluation of the  proximal P1 segments. The stent appears patent. There is severe stenosis of the right P2 segment just distal to the stent, unchanged. There is no other proximal stenosis or occlusion. The aneurysm is suboptimally evaluated due to streak artifact from the stent, but there is no definite residual or recurrent aneurysm. There is no new aneurysm. Venous sinuses: As permitted by contrast timing, patent. Anatomic variants: None. Review of the MIP images confirms the above findings IMPRESSION: 1. No acute intracranial pathology on initial noncontrast head CT. 2. Patent vasculature of the head and neck with no emergent vascular finding. 3. Postprocedural changes reflecting stent assisted coil embolization of the basilar tip aneurysm without definite residual or recurrent aneurysm. 4. Unchanged severe stenosis of the right P2 segment just distal to the stent. Findings of the initial noncontrast head CT were discussed with Dr Wilford Corner at 1:53 pm. The CTA results were discussed at 2:04 pm. Electronically Signed   By: Lesia Hausen M.D.   On: 08/21/2022 14:08   CT ANGIO HEAD NECK W WO CM (CODE STROKE)  Result Date: 08/21/2022 CLINICAL DATA:  Code stroke.  Left hand numbness and headache. EXAM: CT ANGIOGRAPHY HEAD AND NECK TECHNIQUE: Multidetector CT imaging of the head and neck was performed  using the standard protocol during bolus administration of intravenous contrast. Multiplanar CT image reconstructions and MIPs were obtained to evaluate the vascular anatomy. Carotid stenosis measurements (when applicable) are obtained utilizing NASCET criteria, using the distal internal carotid diameter as the denominator. RADIATION DOSE REDUCTION: This exam was performed according to the departmental dose-optimization program which includes automated exposure control, adjustment of the mA and/or kV according to patient size and/or use of iterative reconstruction technique. CONTRAST:  75mL OMNIPAQUE IOHEXOL 350 MG/ML SOLN COMPARISON:  CT/CTA head and neck 01/04/2022 FINDINGS: CT HEAD FINDINGS Brain: There is no evidence of acute intracranial hemorrhage, extra-axial fluid collection, or acute infarct. Background parenchymal volume is normal. The ventricles are normal in size. Gray-white differentiation is preserved There is no mass lesion there is no mass effect or midline shift. The pituitary and suprasellar region are normal. Vascular: There is significant streak artifact from a basilar tip aneurysm coil pack. Skull: Normal. Negative for fracture or focal lesion. Sinuses/Orbits: The paranasal sinuses are clear. The globes and orbits are unremarkable. Other: The mastoid air cells and middle ear cavities are clear. 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 The imaged aortic arch is otherwise unremarkable. The origins of the major branch vessels are patent. The subclavian arteries are patent to the level imaged. Right carotid system: The right common, internal, and external carotid arteries are patent,  without hemodynamically significant stenosis or occlusion there is no evidence of dissection or aneurysm. Left carotid system: The left common, internal, and external carotid arteries are patent, with mild plaque at the bifurcation but no hemodynamically significant stenosis or occlusion there is no evidence of dissection or aneurysm. Vertebral arteries: The vertebral arteries are patent, without hemodynamically significant stenosis or occlusion there is no evidence of dissection or aneurysm. Skeleton: There is no acute osseous abnormality or suspicious osseous lesion. There is mild degenerative change of the cervical spine. There is no visible canal hematoma. Other neck: The soft tissues of the neck are unremarkable. Upper  chest: The imaged lung apices are clear. Review of the MIP images confirms the above findings CTA HEAD FINDINGS Anterior circulation: The intracranial ICAs are patent, without stenosis or occlusion. The bilateral MCAs and ACAS are patent, without proximal stenosis or occlusion. The anterior communicating artery is normal There is no aneurysm or AVM. Posterior circulation: The bilateral V4 segments are patent. The patient is status post stent assisted coil embolization of the basilar tip aneurysm. Streak artifact precludes evaluation of the proximal P1 segments. The stent appears patent. There is severe stenosis of the right P2 segment just distal to the stent, unchanged. There is no other proximal stenosis or occlusion. The aneurysm is suboptimally evaluated due to streak artifact from the stent, but there is no definite residual or recurrent aneurysm. There is no new aneurysm. Venous sinuses: As permitted by contrast timing, patent. Anatomic variants: None. Review of the MIP images confirms the above findings IMPRESSION: 1. No acute intracranial pathology on initial noncontrast head CT. 2. Patent vasculature of the head and neck with no emergent vascular finding. 3. Postprocedural changes  reflecting stent assisted coil embolization of the basilar tip aneurysm without definite residual or recurrent aneurysm. 4. Unchanged severe stenosis of the right P2 segment just distal to the stent. Findings of the initial noncontrast head CT were discussed with Dr Wilford Corner at 1:53 pm. The CTA results were discussed at 2:04 pm. Electronically Signed   By: Lesia Hausen M.D.   On: 08/21/2022 14:08    Procedures .Critical Care  Performed by: Cristopher Peru, PA-C Authorized by: Cristopher Peru, PA-C   Critical care provider statement:    Critical care time (minutes):  40   Critical care time was exclusive of:  Separately billable procedures and treating other patients   Critical care was necessary to treat or prevent imminent or life-threatening deterioration of the following conditions:  CNS failure or compromise   Critical care was time spent personally by me on the following activities:  Development of treatment plan with patient or surrogate, discussions with consultants, discussions with primary provider, evaluation of patient's response to treatment, examination of patient, obtaining history from patient or surrogate, review of old charts, re-evaluation of patient's condition, pulse oximetry, ordering and review of radiographic studies, ordering and review of laboratory studies and ordering and performing treatments and interventions   I assumed direction of critical care for this patient from another provider in my specialty: no      Medications Ordered in ED Medications  iohexol (OMNIPAQUE) 350 MG/ML injection 75 mL (75 mLs Intravenous Contrast Given 08/21/22 1352)  sodium chloride 0.9 % bolus 1,000 mL (1,000 mLs Intravenous New Bag/Given 08/21/22 1511)  diphenhydrAMINE (BENADRYL) injection 25 mg (25 mg Intravenous Given 08/21/22 1512)  ondansetron (ZOFRAN) injection 4 mg (4 mg Intravenous Given 08/21/22 1512)    ED Course/ Medical Decision Making/ A&P Clinical Course as of 08/21/22 1551  Mon  Aug 21, 2022  1504 HA, Dizziness since Friday. Nausea, numbness in L hand. Code stroke...  not a code stroke now. S/p stroke imaging. Neuro thinks migraine. OP referral for Neuro [BB]    Clinical Course User Index [BB] Fayrene Helper, MD    Medical Decision Making Risk Prescription drug management.  62 year old female who presents to the emergency department with dizziness, left hand numbness. Symptoms of numbness began at 1030AM.  On presentation code stroke was called. She was evaluated by Dr. Wilford Corner with neurology.   Patient PMH that increases complexity of ED encounter:  basilar artery aneurysm, stroke, hypertension I independent reviewed and interpreted the labs as followed: labs with no acute findings  - I independently visualized the following imaging with scope of interpretation limited to determining acute life threatening conditions related to emergency care: CT head, CTA head/neck, MR brain, which revealed no acute findings  Patient Reassessment and Ultimate Disposition/Management On presentation to ED, patient was call code stroke and labs/imaging ordered. She was evaluated by Dr. Wilford Corner on arrival. After imaging completed - per Dr. Wilford Corner to treat with migraine cocktail, and outpatient f/u neurology.  Labs without significant findings. On my assessment no focal neurological findings.  Giving 1L IVF, benadryl, zofran.   1550: reassessed the patient and her headache symptoms have resolved at this time. Will discharge her with [facility] follow-up. I have sent an ambulatory referral. Discussed return precautions. She verbalized understanding.   The patient has been appropriately medically screened and/or stabilized in the ED. I have low suspicion for any other emergent medical condition which would require further screening, evaluation or treatment in the ED or require inpatient management. At time of discharge the patient is hemodynamically stable and in no acute distress. I have  discussed work-up results and diagnosis with patient and answered all questions. Patient is agreeable with discharge plan. We discussed strict return precautions for returning to the emergency department and they verbalized understanding.    Patient management required discussion with the following services or consulting groups:  Neurology  Complexity of Problems Addressed Acute complicated illness or Injury  Additional Data Reviewed and Analyzed Further history obtained from: Past medical history and medications listed in the EMR, Prior ED visit notes, Recent PCP notes, Recent Consult notes, and Care Everywhere  Patient Encounter Risk Assessment Prescriptions and Consideration of hospitalization   Final Clinical Impression(s) / ED Diagnoses Final diagnoses:  Other migraine without status migrainosus, not intractable    Rx / DC Orders ED Discharge Orders          Ordered    Ambulatory referral to Neurology       Comments: An appointment is requested in approximately: 1 week   08/21/22 1533              Cristopher Peru, PA-C 08/21/22 1534    Cristopher Peru, PA-C 08/21/22 1554    Arby Barrette, MD 08/21/22 1714

## 2022-08-21 NOTE — ED Triage Notes (Signed)
Pt BIB EMS for complaints of migraine, n/v, photosensitivity and dizziness x 3 days. Worse today. Pt states she had brain surgery 2 years ago and had stroke on the table. Pt called doctor this morning and was advised to come to ed for further evaluation.

## 2022-08-25 LAB — HM MAMMOGRAPHY

## 2022-08-25 NOTE — Progress Notes (Unsigned)
62 y.o. G90P0020 Divorced Caucasian female here for annual exam.    PCP:     Patient's last menstrual period was 01/31/2007.           Sexually active: {yes no:314532}  The current method of family planning is post menopausal status.    Exercising: {yes no:314532}  {types:19826} Smoker:  no  Health Maintenance: Pap:  03/16/21 ASCUS: HR HPV neg, 02/20/18 neg: HR HPV neg History of abnormal Pap:  no MMG:  08/08/21 Breast Density Cat A, BI-RADS CAT 1 neg Colonoscopy:  07/12/22 BMD:   12/08/19  Result  osteoporosis TDaP:  01/30/14 Gardasil:   no HIV: 01/05/22 NR Hep C: 10/19/14 neg Screening Labs:  Hb today: ***, Urine today: ***   reports that she has never smoked. She has never been exposed to tobacco smoke. She has never used smokeless tobacco. She reports current alcohol use of about 1.0 standard drink of alcohol per week. She reports that she does not use drugs.  Past Medical History:  Diagnosis Date   Allergy    Anxiety    Arthritis    knees and spine, shoulder   Asthma    Bronchitis    hx - recurrent   Complication of anesthesia    waking up is not easy   Depression    Elevated IgE level 09/12/2017   09/12/2017 IgE 195   GERD (gastroesophageal reflux disease)    History of COVID-19 12/30/2020   Hx of irritable bowel syndrome    x2   Hyperlipidemia    diet controlled - no medication   Hypertension    not taking any meds at present - under control per patient   Hypokalemia    with PNA admission (2.5)   Hypothyroidism (acquired)    Ischemic cerebrovascular accident (CVA) (HCC)    Migraines    Osteoporosis    Pneumonia    4 episodes; hosp. admission 2014   PONV (postoperative nausea and vomiting)    TIA (transient ischemic attack) 12/17/2020   UC (ulcerative colitis) [facility])    DX'D 2021    Past Surgical History:  Procedure Laterality Date   ANEURYSM COILING     STENT   BREAST SURGERY     implants, then had them removed   COLONOSCOPY     greater 10 yrs ago - ?  [facility]-2017 LAST   DILATION AND CURETTAGE OF UTERUS     IR ANGIO INTRA EXTRACRAN SEL INTERNAL CAROTID BILAT MOD SED  11/19/2020   IR ANGIO VERTEBRAL SEL VERTEBRAL BILAT MOD SED  11/19/2020   IR ANGIO VERTEBRAL SEL VERTEBRAL UNI L MOD SED  02/25/2021   IR ANGIO VERTEBRAL SEL VERTEBRAL UNI L MOD SED  01/13/2022   IR ANGIOGRAM FOLLOW UP STUDY  11/19/2020   IR ANGIOGRAM FOLLOW UP STUDY  11/19/2020   IR ANGIOGRAM FOLLOW UP STUDY  11/19/2020   IR ANGIOGRAM FOLLOW UP STUDY  11/19/2020   IR ANGIOGRAM FOLLOW UP STUDY  11/19/2020   IR ANGIOGRAM FOLLOW UP STUDY  11/19/2020   IR ANGIOGRAM FOLLOW UP STUDY  11/19/2020   IR ANGIOGRAM FOLLOW UP STUDY  11/19/2020   IR INTRA CRAN STENT  11/19/2020   IR TRANSCATH/EMBOLIZ  11/19/2020   LAPAROSCOPIC ABDOMINAL EXPLORATION  01/31/1992   endometriosis   ORIF HUMERUS FRACTURE Left 04/01/2013   DR Magnus Ivan - shoulder   ORIF HUMERUS FRACTURE Left 04/01/2013   Procedure: OPEN REDUCTION INTERNAL FIXATION (ORIF) LEFT PROXIMAL HUMERUS FRACTURE;  Surgeon: Kathryne Hitch, MD;  Location: [facility] OR;  Service: Orthopedics;  Laterality: Left;   RADIOLOGY WITH ANESTHESIA N/A 11/19/2020   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;  Surgeon: Lisbeth Renshaw, MD;  Location: [facility] OR;  Service: Radiology;  Laterality: N/A;   TONSILLECTOMY AND ADENOIDECTOMY      Current Outpatient Medications  Medication Sig Dispense Refill   alendronate (FOSAMAX) 70 MG tablet Take 1 tablet (70 mg total) by mouth every 7 (seven) days. Take with a full glass of water on an empty stomach. 4 tablet 11   amLODipine (NORVASC) 10 MG tablet TAKE 1 TABLET BY MOUTH EVERY DAY 90 tablet 1   ASCORBIC ACID PO Take 1 tablet by mouth every evening. Vitamin C, unknown strength.     aspirin EC 81 MG tablet Take 81 mg by mouth daily. Swallow whole.     atorvastatin (LIPITOR) 80 MG tablet Take 1 tablet (80 mg total) by mouth daily. 90 tablet 1   Budeson-Glycopyrrol-Formoterol  (BREZTRI AEROSPHERE) 160-9-4.8 MCG/ACT AERO Inhale 2 puffs into the lungs in the morning and at bedtime. 5.9 g 1   busPIRone (BUSPAR) 5 MG tablet Take 1 tablet (5 mg total) by mouth 2 (two) times daily. 180 tablet 1   Cholecalciferol (VITAMIN D-3 PO) Take 1 capsule by mouth daily.     clonazePAM (KLONOPIN) 0.5 MG tablet Take 1 tablet (0.5 mg total) by mouth 2 (two) times daily as needed for anxiety. 180 tablet 1   clopidogrel (PLAVIX) 75 MG tablet Take 1 tablet (75 mg total) by mouth daily. 90 tablet 1   desvenlafaxine (PRISTIQ) 50 MG 24 hr tablet Take 1 tablet (50 mg total) by mouth daily. 90 tablet 1   ezetimibe (ZETIA) 10 MG tablet Take 1 tablet (10 mg total) by mouth daily. (Patient taking differently: Take 10 mg by mouth every evening.) 90 tablet 3   levothyroxine (SYNTHROID) 150 MCG tablet Take 1 tablet (150 mcg total) by mouth daily before breakfast. 90 tablet 1   Multiple Vitamins-Minerals (ZINC PO) Take by mouth daily.     OVER THE COUNTER MEDICATION daily. B VITAMINS     pantoprazole (PROTONIX) 40 MG tablet Take 1 tablet (40 mg total) by mouth daily. 90 tablet 3   Vitamin D, Ergocalciferol, (DRISDOL) 1.25 MG (50000 UNIT) CAPS capsule Take 1 capsule (50,000 Units total) by mouth every 14 (fourteen) days. 6 capsule 3   Current Facility-Administered Medications  Medication Dose Route Frequency Provider Last Rate Last Admin   cyanocobalamin (VITAMIN B12) injection 1,000 mcg  1,000 mcg Intramuscular Q14 Days Kuneff, Renee A, DO   1,000 mcg at 08/07/22 1540    Family History  Problem Relation Age of Onset   Breast cancer Mother    Diabetes Mother    Heart disease Mother    Diabetes Father    Dementia Father    Colon cancer Father    Stroke Paternal Grandmother    Diabetes Paternal Grandmother    Colon polyps Neg Hx    Esophageal cancer Neg Hx    Stomach cancer Neg Hx    Rectal cancer Neg Hx    Crohn's disease Neg Hx    Ulcerative colitis Neg Hx     Review of Systems  Exam:    LMP 01/31/2007     General appearance: alert, cooperative and appears stated age Head: normocephalic, without obvious abnormality, atraumatic Neck: no adenopathy, supple, symmetrical, trachea midline and thyroid normal to inspection and palpation Lungs: clear to auscultation bilaterally Breasts: normal appearance, no masses  or tenderness, No nipple retraction or dimpling, No nipple discharge or bleeding, No axillary adenopathy Heart: regular rate and rhythm Abdomen: soft, non-tender; no masses, no organomegaly Extremities: extremities normal, atraumatic, no cyanosis or edema Skin: skin color, texture, turgor normal. No rashes or lesions Lymph nodes: cervical, supraclavicular, and axillary nodes normal. Neurologic: grossly normal  Pelvic: External genitalia:  no lesions              No abnormal inguinal nodes palpated.              Urethra:  normal appearing urethra with no masses, tenderness or lesions              Bartholins and Skenes: normal                 Vagina: normal appearing vagina with normal color and discharge, no lesions              Cervix: no lesions              Pap taken: {yes no:314532} Bimanual Exam:  Uterus:  normal size, contour, position, consistency, mobility, non-tender              Adnexa: no mass, fullness, tenderness              Rectal exam: {yes no:314532}.  Confirms.              Anus:  normal sphincter tone, no lesions  Chaperone was present for exam:  ***  Assessment:   Well woman visit with gynecologic exam.   Plan: Mammogram screening discussed. Self breast awareness reviewed. Pap and HR HPV as above. Guidelines for Calcium, Vitamin D, regular exercise program including cardiovascular and weight bearing exercise.   Follow up annually and prn.   Additional counseling given.  {yes [id number]. _______ minutes face to face time of which over 50% was spent in counseling.    After visit summary provided.

## 2022-08-28 ENCOUNTER — Telehealth: Payer: Self-pay

## 2022-08-28 ENCOUNTER — Other Ambulatory Visit: Payer: Self-pay

## 2022-08-28 ENCOUNTER — Encounter: Payer: Self-pay | Admitting: Family Medicine

## 2022-08-28 NOTE — Telephone Encounter (Signed)
Signed and returned to cma work basket if due

## 2022-08-28 NOTE — Telephone Encounter (Signed)
Received bone density orders from [facility] on 08/28/22. Will place on PCP desk for review.

## 2022-08-28 NOTE — Telephone Encounter (Signed)
Pt seen in ED ([facility])  [facility] - Client TELEPHONE ADVICE RECORD [facility] Patient Name First: Naraly Last: Space Gender: Female DOB: 09-Apr-1960 Age: 62 Y 2 M 5 D Return Phone Number: [phone] (Primary)- Address: City/ State/ Zip: [place]  [address] Client [facility] Day - Client Client Site [facility] - Day Provider [place], [place] Contact Type Call Who Is Calling Patient / Member / Family / Caregiver Call Type Triage / Clinical Relationship To Patient Self Return Phone Number [phone] (Primary) Chief Complaint NUMBNESS/TINGLING- sudden on one side of the body or face Reason for Call Symptomatic / Request for Health Information Initial Comment Patient has a high blood pressure reading of 151/101, she is experiencing headaches, nausea, along with some finger tingling. Translation No Nurse Assessment Nurse: Humfleet, RN, Marchelle Folks Date/Time (Eastern Time): 08/21/2022 11:26:44 AM Confirm and document reason for call. If symptomatic, describe symptoms. ---caller states she is on amlodipine and took it this morning. bp 161/101 [facility]. Has headache, nausea, finger tingly on the left side, vision almost double. Does the patient have any new or worsening symptoms? ---Yes Will a triage be completed? ---Yes Related visit to physician within the last 2 weeks? ---N/A Does the PT have any chronic conditions? (i.e. diabetes, asthma, this includes High risk factors for pregnancy, etc.) ---Yes List chronic conditions. ---HTN Is this a behavioral health or substance abuse call? ---No Guidelines Guideline Title Affirmed Question Affirmed Notes Nurse Date/Time (Eastern Time) Blood Pressure - High [1] Numbness (i.e., loss of sensation) of the face, arm or leg on one side of the body AND [2] newonset Humfleet, RN, Marchelle Folks 08/21/2022 11:28:25 AM PLEASE NOTE: All timestamps contained  within this report are represented as [place] Standard Time. CONFIDENTIALTY NOTICE: This fax transmission is intended only for the addressee. It contains information that is legally privileged, confidential or otherwise protected from use or disclosure. If you are not the intended recipient, you are strictly prohibited from reviewing, disclosing, copying using or disseminating any of this information or taking any action in reliance on or regarding this information. If you have received this fax in error, please notify us immediately by telephone so that we can arrange for its return to [place]. Phone: [phone], Toll-Free: [phone], Fax: [phone] Page: 2 of 2 Call Id: [id number] Disp. Time Lamount Cohen Time) Disposition Final User 08/21/2022 11:24:04 AM Send to Urgent Ruta Hinds 08/21/2022 11:29:16 AM Call EMS 911 Now Yes Humfleet, RN, Marchelle Folks 08/21/2022 11:36:48 AM 911 Outcome Documentation Humfleet, RN, Marchelle Folks Reason: 911 call back - did not answer Final Disposition 08/21/2022 11:29:16 AM Call EMS 911 Now Yes Humfleet, RN, Earnestine Leys Disagree/Comply Comply Caller Understands Yes PreDisposition InappropriateToAsk Care Advice Given Per Guideline CARE ADVICE given per High Blood Pressure (Adult) guideline. CALL EMS 911 NOW: Comments User: Jaclynn Major, RN Date/Time Lamount Cohen Time): 08/21/2022 11:29:12 AM during nurse assessment patient stated her hand was now going numb. says history of several CVAs. advised 911 now

## 2022-08-29 ENCOUNTER — Ambulatory Visit: Payer: BC Managed Care – PPO

## 2022-08-29 ENCOUNTER — Ambulatory Visit (INDEPENDENT_AMBULATORY_CARE_PROVIDER_SITE_OTHER): Payer: BC Managed Care – PPO

## 2022-08-29 DIAGNOSIS — E538 Deficiency of other specified B group vitamins: Secondary | ICD-10-CM | POA: Diagnosis not present

## 2022-08-29 NOTE — Progress Notes (Signed)
Pt here for 4/4 biweekly B12 injection per Dr.Kunef (She could elect to complete B12 injections which she could receive at the office. Initially this would be scheduled every 2 weeks for 4 doses, and then monthly )  B12 given IM, and pt tolerated injection well.  Next B12 injection scheduled for 1 month.

## 2022-08-29 NOTE — Telephone Encounter (Signed)
Faxed

## 2022-08-30 ENCOUNTER — Ambulatory Visit (INDEPENDENT_AMBULATORY_CARE_PROVIDER_SITE_OTHER): Payer: BC Managed Care – PPO | Admitting: Obstetrics and Gynecology

## 2022-08-30 ENCOUNTER — Encounter: Payer: Self-pay | Admitting: Obstetrics and Gynecology

## 2022-08-30 VITALS — BP 126/82 | HR 99 | Ht 64.0 in | Wt 174.0 lb

## 2022-08-30 DIAGNOSIS — L750 Bromhidrosis: Secondary | ICD-10-CM | POA: Diagnosis not present

## 2022-08-30 DIAGNOSIS — Z01419 Encounter for gynecological examination (general) (routine) without abnormal findings: Secondary | ICD-10-CM

## 2022-08-30 DIAGNOSIS — E538 Deficiency of other specified B group vitamins: Secondary | ICD-10-CM | POA: Diagnosis not present

## 2022-08-30 DIAGNOSIS — M810 Age-related osteoporosis without current pathological fracture: Secondary | ICD-10-CM | POA: Diagnosis not present

## 2022-08-30 LAB — URINALYSIS, COMPLETE W/RFL CULTURE
Casts: NONE SEEN /LPF
Crystals: NONE SEEN /HPF
Glucose, UA: NEGATIVE
Hgb urine dipstick: NEGATIVE
Nitrites, Initial: NEGATIVE
RBC / HPF: NONE SEEN /HPF (ref 0–2)
Specific Gravity, Urine: 1.025 (ref 1.001–1.035)
Yeast: NONE SEEN /HPF
pH: 5.5 (ref 5.0–8.0)

## 2022-08-30 LAB — CULTURE INDICATED

## 2022-08-30 NOTE — Patient Instructions (Signed)

## 2022-08-31 ENCOUNTER — Telehealth: Payer: Self-pay | Admitting: *Deleted

## 2022-08-31 NOTE — Telephone Encounter (Signed)
Call returned to patient.  Patient requesting UA results from 08/30/22.  Denies any new or worsening symptoms.  Advised I will forward to Dr. Edward Jolly to review, urine culture pending, this takes 72 hours to complete and then needs to be reviewed by provider. Our office will notify once this has been done. Patient agreeable and appreciative of call.   Routing to provider for final review. Patient is agreeable to disposition. Will close encounter.

## 2022-09-01 ENCOUNTER — Telehealth: Payer: Self-pay | Admitting: Family Medicine

## 2022-09-01 NOTE — Telephone Encounter (Signed)
LM for pt to return call to discuss.  

## 2022-09-01 NOTE — Telephone Encounter (Signed)
Prescription Request  09/01/2022  LOV: 07/05/2022  What is the name of the medication or equipment? clonazePAM (KLONOPIN) 0.5 MG tablet   Have you contacted your pharmacy to request a refill? Yes   Which pharmacy would you like this sent to?  [id number] - [place], [place] - [address] [place] [address] Phone: [phone] Fax: [phone]    Patient notified that their request is being sent to the clinical staff for review and that they should receive a response within 2 business days.   Please advise at Mobile [phone] (mobile)

## 2022-09-01 NOTE — Telephone Encounter (Signed)
Refill request is for a controlled substance. We are not legally able to refill this without appt. Can be vistual if desired.

## 2022-09-04 ENCOUNTER — Telehealth: Payer: Self-pay | Admitting: Family Medicine

## 2022-09-04 NOTE — Telephone Encounter (Signed)
Patient calls crying and upset. She is seething her therapist from 9-10, so please give the patient a call after that.  She states her Wellbutrin she believes is causing the side effects she is experiencing.  Please give the patient a call to discuss follow ups.

## 2022-09-04 NOTE — Telephone Encounter (Signed)
Pt needs to schedule appt

## 2022-09-05 ENCOUNTER — Encounter: Payer: Self-pay | Admitting: Family Medicine

## 2022-09-05 ENCOUNTER — Ambulatory Visit: Payer: BC Managed Care – PPO | Admitting: Family Medicine

## 2022-09-05 VITALS — BP 118/83 | HR 103 | Temp 98.5°F | Wt 174.2 lb

## 2022-09-05 DIAGNOSIS — R0981 Nasal congestion: Secondary | ICD-10-CM

## 2022-09-05 DIAGNOSIS — Z20822 Contact with and (suspected) exposure to covid-19: Secondary | ICD-10-CM

## 2022-09-05 DIAGNOSIS — F419 Anxiety disorder, unspecified: Secondary | ICD-10-CM | POA: Diagnosis not present

## 2022-09-05 DIAGNOSIS — F330 Major depressive disorder, recurrent, mild: Secondary | ICD-10-CM | POA: Diagnosis not present

## 2022-09-05 LAB — POC COVID19 BINAXNOW: SARS Coronavirus 2 Ag: NEGATIVE

## 2022-09-05 MED ORDER — BUSPIRONE HCL 10 MG PO TABS: 10.0000 mg | ORAL_TABLET | Freq: Two times a day (BID) | ORAL | 5 refills | Status: DC

## 2022-09-05 MED ORDER — CLONAZEPAM 0.5 MG PO TABS: 0.5000 mg | ORAL_TABLET | Freq: Two times a day (BID) | ORAL | 1 refills | Status: DC | PRN

## 2022-09-05 MED ORDER — DESVENLAFAXINE SUCCINATE ER 100 MG PO TB24: 100.0000 mg | ORAL_TABLET | Freq: Every day | ORAL | 1 refills | Status: DC

## 2022-09-05 NOTE — Progress Notes (Signed)
Tiffany Horn , 1960-06-29, 62 y.o., female MRN: [id number] Patient Care Team    Relationship Specialty Notifications Start End  Natalia Leatherwood, DO PCP - General Family Medicine  08/21/22   Jake Bathe, MD PCP - Cardiology Cardiology  08/21/22   Napoleon Form, MD Consulting Physician Gastroenterology  05/29/16   Ernestina Penna, MD Referring Physician Obstetrics and Gynecology  07/17/17   Josephine Igo, DO Consulting Physician Pulmonary Disease  06/12/19   Patton Salles, MD Consulting Physician Obstetrics and Gynecology  08/30/22     Chief Complaint  Patient presents with   Covid Exposure    Friday; nasal congestion started sunday   Depression     Subjective: Tiffany Horn is a  62 y.o. Pt presents for an OV to discuss  Depression and anxiety.  Patient recently had GeneSight testing completed in medication regimen for depression anxiety was altered.  She is currently prescribed Pristiq 50 mg qd, BuSpar5 mg BID and Prozac was weaned off.  She has been prescribed Klonopin 0.5 mg in the past.  Patient had called in within the last week to ask for refills on klonopin. She states the new med combo did not work and "made everything worse."  She reports she does not think she was given the BuSpar at the pharmacy. Patient reports since she was seen last year about 6 weeks ago, she did go on vacation with her significant other and reports she had a miserable time.  Since then she has broken things off with him.  Unfortunately, her dog had passed away while she was on vacation.  She has a another dog at home that has melanoma and she fears he is worsening.  She reports she is feeling many emotions of sadness, anxiety, grief and anger.  Prior note: with concerns of continued fatigue.  She reports compliance with her thyroid medication which has been under supplemented in the past from compliance.  She reports her asthma seems to be well-controlled.  Has a history of CVA.   She follows closely with her neurosurgical team.  She has a history of aneurysms as well.  She is wondering if she is lacking in any vitamins that could be creating her fatigue.  She has had B12 deficiency in the past and was on B12 injections. Reports she never feels rested however over the last month things have become "rough.  "She reports she just does not feel good she feels like something is wrong, "not right.  "She has been more moody and irritable. She is seeing a therapist and had her significant other go to therapy session with her to discuss his drinking patterns effects on the relationship.  Covid exposure: Pt also reports she was exposed to covid (4 days ago) and started having symptoms 2 days ago. She has not completed a home test.     09/05/2022    2:08 PM 07/05/2022    9:34 AM 09/13/2021    2:52 PM 04/12/2021    2:14 PM 03/07/2021    8:33 AM  Depression screen PHQ 2/9  Decreased Interest 2 3 1 1  0  Down, Depressed, Hopeless 3 3 0 1 0  PHQ - 2 Score 5 6 1 2  0  Altered sleeping 3 3 3  0   Tired, decreased energy 2 3 2  0   Change in appetite 3 1 0 0   Feeling bad or failure about yourself  3 1 0  0   Trouble concentrating 2 2 2  0   Moving slowly or fidgety/restless 3 0 0 0   Suicidal thoughts 1 0 0 0   PHQ-9 Score 22 16 8 2    Difficult doing work/chores Very difficult Somewhat difficult       Allergies  Allergen Reactions   Abilify [Aripiprazole] Nausea And Vomiting   Amoxil [Amoxicillin] Diarrhea   Promethazine Hcl Other (See Comments)    Made pt feel "weird"   Seroquel [Quetiapine Fumarate] Other (See Comments)    Pt does not recall reaction   Social History   Social History Narrative   She is originally from [place]. She has traveled to [facility], [place], [place], [place], [place], [place], [place], [place], [place]. No international travel. She has dogs. No prior bird, mold, or recent hot tub exposure. She hasn't used her hot tub in 1.5 years. 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 Previously enjoyed gardening and playing tennis. Helps to care for her mother.   Past Medical History:  Diagnosis Date   Allergy    Anxiety    Arthritis    knees and spine, shoulder   Asthma    Bronchitis    hx - recurrent   Complication of anesthesia    waking up is not easy   Depression    Elevated IgE level 09/12/2017   09/12/2017 IgE 195   GERD (gastroesophageal reflux disease)    History of COVID-19 12/30/2020   Hx of irritable bowel syndrome    x2   Hyperlipidemia    diet controlled - no medication   Hypertension    not taking any meds at present - under control per patient   Hypokalemia    with PNA admission (2.5)   Hypothyroidism (acquired)    Ischemic cerebrovascular accident (CVA) (HCC)    Migraines    Osteoporosis    Pneumonia    4 episodes; hosp. admission 2014   PONV (postoperative nausea and vomiting)    TIA (transient ischemic attack) 12/17/2020   UC (ulcerative colitis) [facility])    DX'D 2021   Past Surgical History:  Procedure Laterality Date   ANEURYSM COILING     STENT   BREAST SURGERY     implants, then had them removed   COLONOSCOPY     greater 10 yrs ago - ? [facility]-2017 LAST   DILATION AND CURETTAGE OF UTERUS     IR ANGIO INTRA EXTRACRAN SEL INTERNAL CAROTID BILAT MOD SED  11/19/2020   IR ANGIO VERTEBRAL SEL VERTEBRAL BILAT MOD SED  11/19/2020   IR ANGIO VERTEBRAL SEL VERTEBRAL UNI L MOD SED  02/25/2021   IR ANGIO VERTEBRAL SEL VERTEBRAL UNI L MOD SED  01/13/2022   IR ANGIOGRAM FOLLOW UP STUDY  11/19/2020   IR ANGIOGRAM FOLLOW UP STUDY  11/19/2020   IR ANGIOGRAM FOLLOW UP STUDY  11/19/2020   IR ANGIOGRAM FOLLOW UP STUDY  11/19/2020   IR ANGIOGRAM FOLLOW UP STUDY  11/19/2020   IR ANGIOGRAM FOLLOW UP STUDY  11/19/2020   IR ANGIOGRAM FOLLOW UP STUDY  11/19/2020   IR ANGIOGRAM FOLLOW UP STUDY  11/19/2020   IR INTRA CRAN STENT  11/19/2020   IR TRANSCATH/EMBOLIZ  11/19/2020   LAPAROSCOPIC ABDOMINAL  EXPLORATION  01/31/1992   endometriosis   ORIF HUMERUS FRACTURE Left 04/01/2013   DR Magnus Ivan - shoulder   ORIF HUMERUS FRACTURE Left 04/01/2013   Procedure: OPEN REDUCTION INTERNAL FIXATION (  ORIF) LEFT PROXIMAL HUMERUS FRACTURE;  Surgeon: Kathryne Hitch, MD;  Location: [facility] OR;  Service: Orthopedics;  Laterality: Left;   RADIOLOGY WITH ANESTHESIA N/A 11/19/2020   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;  Surgeon: Lisbeth Renshaw, MD;  Location: [facility] OR;  Service: Radiology;  Laterality: N/A;   TONSILLECTOMY AND ADENOIDECTOMY     Family History  Problem Relation Age of Onset   Breast cancer Mother    Diabetes Mother    Heart disease Mother    Diabetes Father    Dementia Father    Colon cancer Father    Stroke Paternal Grandmother    Diabetes Paternal Grandmother    Colon polyps Neg Hx    Esophageal cancer Neg Hx    Stomach cancer Neg Hx    Rectal cancer Neg Hx    Crohn's disease Neg Hx    Ulcerative colitis Neg Hx    Allergies as of 09/05/2022       Reactions   Abilify [aripiprazole] Nausea And Vomiting   Amoxil [amoxicillin] Diarrhea   Promethazine Hcl Other (See Comments)   Made pt feel "weird"   Seroquel [quetiapine Fumarate] Other (See Comments)   Pt does not recall reaction        Medication List        Accurate as of September 05, 2022  3:45 PM. If you have any questions, ask your nurse or doctor.          STOP taking these medications    ASCORBIC ACID PO Stopped by: Felix Pacini   buPROPion 75 MG tablet Commonly known as: WELLBUTRIN Stopped by: Felix Pacini   OVER THE COUNTER MEDICATION Stopped by: Felix Pacini       TAKE these medications    alendronate 70 MG tablet Commonly known as: FOSAMAX Take 1 tablet (70 mg total) by mouth every 7 (seven) days. Take with a full glass of water on an empty stomach.   amLODipine 10 MG tablet Commonly known as: NORVASC TAKE 1 TABLET BY MOUTH EVERY DAY   aspirin EC 81 MG tablet Take  81 mg by mouth daily. Swallow whole.   atorvastatin 80 MG tablet Commonly known as: LIPITOR Take 1 tablet (80 mg total) by mouth daily.   Breztri Aerosphere 160-9-4.8 MCG/ACT Aero Generic drug: Budeson-Glycopyrrol-Formoterol Inhale 2 puffs into the lungs in the morning and at bedtime.   busPIRone 10 MG tablet Commonly known as: BUSPAR Take 1 tablet (10 mg total) by mouth 2 (two) times daily. What changed:  medication strength how much to take Changed by: Felix Pacini   clonazePAM 0.5 MG tablet Commonly known as: KLONOPIN Take 1 tablet (0.5 mg total) by mouth 2 (two) times daily as needed for anxiety.   clopidogrel 75 MG tablet Commonly known as: PLAVIX Take 1 tablet (75 mg total) by mouth daily.   desvenlafaxine 100 MG 24 hr tablet Commonly known as: Pristiq Take 1 tablet (100 mg total) by mouth daily. What changed:  medication strength how much to take Changed by: Felix Pacini   ezetimibe 10 MG tablet Commonly known as: Zetia Take 1 tablet (10 mg total) by mouth daily. What changed: when to take this   levothyroxine 150 MCG tablet Commonly known as: Synthroid Take 1 tablet (150 mcg total) by mouth daily before breakfast.   pantoprazole 40 MG tablet Commonly known as: PROTONIX Take 1 tablet (40 mg total) by mouth daily.   Vitamin D (Ergocalciferol) 1.25 MG (50000 UNIT) Caps capsule Commonly known  as: DRISDOL Take 1 capsule (50,000 Units total) by mouth every 14 (fourteen) days.   VITAMIN D-3 PO Take 1 capsule by mouth daily.   ZINC PO Take by mouth daily.        All past medical history, surgical history, allergies, family history, immunizations andmedications were updated in the EMR today and reviewed under the history and medication portions of their EMR.     ROS Negative, with the exception of above mentioned in HPI   Objective:  BP 118/83   Pulse (!) 103   Temp 98.5 F (36.9 C)   Wt 174 lb 3.2 oz (79 kg)   LMP 01/31/2007   SpO2 96%   BMI  29.90 kg/m  Body mass index is 29.9 kg/m. Physical Exam Vitals and nursing note reviewed.  Constitutional:      General: She is not in acute distress.    Appearance: Normal appearance. She is normal weight. She is not ill-appearing or toxic-appearing.  HENT:     Head: Normocephalic and atraumatic.     Nose: Congestion and rhinorrhea present.  Eyes:     General: No scleral icterus.       Right eye: No discharge.        Left eye: No discharge.     Extraocular Movements: Extraocular movements intact.     Conjunctiva/sclera: Conjunctivae normal.     Pupils: Pupils are equal, round, and reactive to light.  Pulmonary:     Effort: Pulmonary effort is normal. No respiratory distress.     Breath sounds: Normal breath sounds. No wheezing, rhonchi or rales.  Skin:    Findings: No rash.  Neurological:     Mental Status: She is alert and oriented to person, place, and time. Mental status is at baseline.     Motor: No weakness.     Coordination: Coordination normal.     Gait: Gait normal.  Psychiatric:        Attention and Perception: Attention and perception normal.        Mood and Affect: Mood is anxious and depressed. Affect is tearful.        Speech: Speech normal.        Behavior: Behavior normal. Behavior is cooperative.        Thought Content: Thought content normal. Thought content is not paranoid or delusional. Thought content does not include homicidal or suicidal ideation. Thought content does not include homicidal or suicidal plan.        Cognition and Memory: Cognition and memory normal.        Judgment: Judgment normal. Judgment is not impulsive or inappropriate.    No results found. No results found. Results for orders placed or performed in visit on 09/05/22 (from the past 24 hour(s))  POC COVID-19 BinaxNow     Status: None   Collection Time: 09/05/22  2:22 PM  Result Value Ref Range   SARS Coronavirus 2 Ag Negative Negative    Assessment/Plan: SHIZUYE MESSAMORE is a  62 y.o. female present for OV for  Major depressive disorder, recurrent episode, mild (HCC)/anxiety Lengthy discussion today with patient she is currently going through a very difficult time in her life with a relationship and the recent loss of her pet dog.  She is approximately 1-1/2 months and from starting the new medication, and she is not having any negative side effects, but she does have signs and symptoms of not having enough coverage. Continue with her therapist. Increase pristiq 50 mg >  100 mg every day.  Start buspar 5 mg BID in 2-3 days, then increase to 10 mg BID.     -She does not believe she was given the BuSpar prescription from the pharmacy. Restart klonopin 0.5 mg BID PRN  Nasal congestion/ Exposure to COVID-19 virus - POC COVID-19 BinaxNow> negative OTC supportive care  Reviewed expectations re: course of current medical issues. Discussed self-management of symptoms. Outlined signs and symptoms indicating need for more acute intervention. Patient verbalized understanding and all questions were answered. Patient received an After-Visit Summary.  45 minutes spent during patient encounter on date of service covering worsening depression and anxiety with new onset symptoms of COVID Dependent upon laboratory results and GeneSight testing results Orders Placed This Encounter  Procedures   POC COVID-19 BinaxNow   Meds ordered this encounter  Medications   clonazePAM (KLONOPIN) 0.5 MG tablet    Sig: Take 1 tablet (0.5 mg total) by mouth 2 (two) times daily as needed for anxiety.    Dispense:  180 tablet    Refill:  1   busPIRone (BUSPAR) 10 MG tablet    Sig: Take 1 tablet (10 mg total) by mouth 2 (two) times daily.    Dispense:  60 tablet    Refill:  5   desvenlafaxine (PRISTIQ) 100 MG 24 hr tablet    Sig: Take 1 tablet (100 mg total) by mouth daily.    Dispense:  90 tablet    Refill:  1   Referral Orders  No referral(s) requested today     Note is dictated  utilizing voice recognition software. Although note has been proof read prior to signing, occasional typographical errors still can be missed. If any questions arise, please do not hesitate to call for verification.   electronically signed by:  Felix Pacini, DO  [facility] - [place]

## 2022-09-05 NOTE — Patient Instructions (Addendum)
Pristiq increase to 100 mg a day (new pill called in today is 100 mg per tab) Buspar 10 mg tab- 1/2 tab, twice a day for 3 days, then 1 tab twice a day.   Klonopin refilled for you

## 2022-09-13 ENCOUNTER — Encounter: Payer: Self-pay | Admitting: Gastroenterology

## 2022-09-25 ENCOUNTER — Encounter: Payer: BC Managed Care – PPO | Admitting: Gastroenterology

## 2022-09-27 ENCOUNTER — Other Ambulatory Visit: Payer: Self-pay | Admitting: Family Medicine

## 2022-10-03 ENCOUNTER — Telehealth: Payer: Self-pay

## 2022-10-03 NOTE — Telephone Encounter (Signed)
She states that she has some questions about B12 injection and medications.  She is aware Dr. Claiborne Billings out of office until next week.  Patient stated that Dr. Claiborne Billings told her that she would probably have to increase meds if she can tell any improvements.  busPIRone (BUSPAR) 10 MG tablet  desvenlafaxine (PRISTIQ) 100 MG 24 hr tablet

## 2022-10-04 NOTE — Telephone Encounter (Signed)
Waiting on covering providers response to previous message

## 2022-10-04 NOTE — Telephone Encounter (Signed)
Patient came by office, she was driving by and decided to stop by instead of calling.  She is checking on status from yesterday of increasing her meds because she is having to put down one of her pets and meds she is on now is not helping.  Please call patient [phone]

## 2022-10-06 NOTE — Telephone Encounter (Signed)
Pt states she is taking buspar, klonipin, and pristiq as prescribed from appt 08/06

## 2022-10-13 ENCOUNTER — Ambulatory Visit: Payer: BC Managed Care – PPO | Admitting: Family Medicine

## 2022-10-13 ENCOUNTER — Encounter: Payer: Self-pay | Admitting: Family Medicine

## 2022-10-13 VITALS — BP 118/83 | HR 75 | Temp 98.0°F | Wt 172.6 lb

## 2022-10-13 DIAGNOSIS — M810 Age-related osteoporosis without current pathological fracture: Secondary | ICD-10-CM | POA: Diagnosis not present

## 2022-10-13 DIAGNOSIS — F330 Major depressive disorder, recurrent, mild: Secondary | ICD-10-CM

## 2022-10-13 DIAGNOSIS — E782 Mixed hyperlipidemia: Secondary | ICD-10-CM | POA: Diagnosis not present

## 2022-10-13 DIAGNOSIS — R7983 Abnormal findings of blood amino-acid level: Secondary | ICD-10-CM | POA: Diagnosis not present

## 2022-10-13 DIAGNOSIS — E538 Deficiency of other specified B group vitamins: Secondary | ICD-10-CM

## 2022-10-13 DIAGNOSIS — Z23 Encounter for immunization: Secondary | ICD-10-CM

## 2022-10-13 DIAGNOSIS — I725 Aneurysm of other precerebral arteries: Secondary | ICD-10-CM

## 2022-10-13 DIAGNOSIS — E559 Vitamin D deficiency, unspecified: Secondary | ICD-10-CM | POA: Diagnosis not present

## 2022-10-13 DIAGNOSIS — F419 Anxiety disorder, unspecified: Secondary | ICD-10-CM

## 2022-10-13 DIAGNOSIS — I6389 Other cerebral infarction: Secondary | ICD-10-CM

## 2022-10-13 DIAGNOSIS — I10 Essential (primary) hypertension: Secondary | ICD-10-CM

## 2022-10-13 LAB — COMPREHENSIVE METABOLIC PANEL
ALT: 14 U/L (ref 0–35)
AST: 14 U/L (ref 0–37)
Albumin: 3.9 g/dL (ref 3.5–5.2)
Alkaline Phosphatase: 72 U/L (ref 39–117)
BUN: 11 mg/dL (ref 6–23)
CO2: 30 meq/L (ref 19–32)
Calcium: 9.2 mg/dL (ref 8.4–10.5)
Chloride: 105 meq/L (ref 96–112)
Creatinine, Ser: 0.75 mg/dL (ref 0.40–1.20)
GFR: 86.59 mL/min (ref >60.00)
Glucose, Bld: 88 mg/dL (ref 70–99)
Potassium: 4.2 meq/L (ref 3.5–5.1)
Sodium: 141 meq/L (ref 135–145)
Total Bilirubin: 0.4 mg/dL (ref 0.2–1.2)
Total Protein: 6.5 g/dL (ref 6.0–8.3)

## 2022-10-13 LAB — LIPID PANEL
Cholesterol: 223 mg/dL — ABNORMAL HIGH (ref 0–200)
HDL: 58.5 mg/dL (ref >39.00)
LDL Cholesterol: 131 mg/dL — ABNORMAL HIGH (ref 0–99)
NonHDL: 164.51
Total CHOL/HDL Ratio: 4
Triglycerides: 170 mg/dL — ABNORMAL HIGH (ref 0.0–149.0)
VLDL: 34 mg/dL (ref 0.0–40.0)

## 2022-10-13 LAB — CBC
HCT: 43.7 % (ref 36.0–46.0)
Hemoglobin: 14.2 g/dL (ref 12.0–15.0)
MCHC: 32.4 g/dL (ref 30.0–36.0)
MCV: 92 fl (ref 78.0–100.0)
Platelets: 247 10*3/uL (ref 150.0–400.0)
RBC: 4.75 Mil/uL (ref 3.87–5.11)
RDW: 13 % (ref 11.5–15.5)
WBC: 4.8 10*3/uL (ref 4.0–10.5)

## 2022-10-13 LAB — B12 AND FOLATE PANEL
Folate: 5.6 ng/mL — ABNORMAL LOW (ref >5.9)
Vitamin B-12: 289 pg/mL (ref 211–911)

## 2022-10-13 LAB — HEMOGLOBIN A1C: Hgb A1c MFr Bld: 5.6 % (ref 4.6–6.5)

## 2022-10-13 LAB — VITAMIN D 25 HYDROXY (VIT D DEFICIENCY, FRACTURES): VITD: 29.65 ng/mL — ABNORMAL LOW (ref 30.00–100.00)

## 2022-10-13 MED ORDER — VILAZODONE HCL 40 MG PO TABS: 40.0000 mg | ORAL_TABLET | Freq: Every day | ORAL | 1 refills | Status: DC

## 2022-10-13 MED ORDER — EZETIMIBE 10 MG PO TABS: 10.0000 mg | ORAL_TABLET | Freq: Every evening | ORAL | 3 refills | Status: DC

## 2022-10-13 MED ORDER — ATORVASTATIN CALCIUM 80 MG PO TABS: 80.0000 mg | ORAL_TABLET | Freq: Every day | ORAL | 1 refills | Status: DC

## 2022-10-13 MED ORDER — BUSPIRONE HCL 15 MG PO TABS: 15.0000 mg | ORAL_TABLET | Freq: Two times a day (BID) | ORAL | 1 refills | Status: DC

## 2022-10-13 MED ORDER — CLOPIDOGREL BISULFATE 75 MG PO TABS: 75.0000 mg | ORAL_TABLET | Freq: Every day | ORAL | 1 refills | Status: DC

## 2022-10-13 MED ORDER — LEVOTHYROXINE SODIUM 150 MCG PO TABS: 150.0000 ug | ORAL_TABLET | Freq: Every day | ORAL | 1 refills | Status: DC

## 2022-10-13 MED ORDER — AMLODIPINE BESYLATE 10 MG PO TABS: 10.0000 mg | ORAL_TABLET | Freq: Every day | ORAL | 1 refills | Status: DC

## 2022-10-13 NOTE — Progress Notes (Signed)
Tiffany Horn , 30-May-1960, 62 y.o., female MRN: [id number] Patient Care Team    Relationship Specialty Notifications Start End  Natalia Leatherwood, DO PCP - General Family Medicine  08/21/22   Jake Bathe, MD PCP - Cardiology Cardiology  08/21/22   Napoleon Form, MD Consulting Physician Gastroenterology  05/29/16   Ernestina Penna, MD Referring Physician Obstetrics and Gynecology  07/17/17   Josephine Igo, DO Consulting Physician Pulmonary Disease  06/12/19   Patton Salles, MD Consulting Physician Obstetrics and Gynecology  08/30/22     Chief Complaint  Patient presents with   Depression     Subjective: Tiffany Horn is a  62 y.o. Pt presents for an OV   Mixed hyperlipidemia/Primary hypertension/CVA/basilar artery aneurysm Patient reports compliance with amlodipine 10 mg daily, Zetia 10 mg, atorvastatin 80 mg nightly, Plavix. Patient denies chest pain, shortness of breath, dizziness or lower extremity edema.    Major depressive disorder, recurrent episode, mild (HCC)/Anxiety Patient reports she feels about the same as she did on her prior medication regimens and does not see great improvement on the Pristiq/BuSpar regimen, despite acknowledging the recommendations of best suited meds for her by GeneSight testing.  She is attending therapy twice a week.  Has tapered up to the Pristiq 100 mg daily and BuSpar 10 mg twice daily for about 5 weeks now.  She reports she is getting out and walking daily.  She has been socializing with her friends and not isolating.  She has been hit with some unfortunate circumstances where she has lost her job and is looking for a new job.  She had a friend passed away from colon cancer unexpectedly.  One of her 39 year old dogs recently died.  She has been in contact with her ex-boyfriend and is considering getting back together with him.  Vitamin D deficiency/ Age-related osteoporosis without current pathological fracture She has been  supplementing with the high-dose vitamin D supplementation.   Homocystinemia/B12 deficiency Patient has been receiving B12 injections     10/13/2022    9:01 AM 09/05/2022    2:08 PM 07/05/2022    9:34 AM 09/13/2021    2:52 PM 04/12/2021    2:14 PM  Depression screen PHQ 2/9  Decreased Interest 2 2 3 1 1   Down, Depressed, Hopeless 3 3 3  0 1  PHQ - 2 Score 5 5 6 1 2   Altered sleeping 2 3 3 3  0  Tired, decreased energy 3 2 3 2  0  Change in appetite 3 3 1  0 0  Feeling bad or failure about yourself  2 3 1  0 0  Trouble concentrating 3 2 2 2  0  Moving slowly or fidgety/restless 2 3 0 0 0  Suicidal thoughts 0 1 0 0 0  PHQ-9 Score 20 22 16 8 2   Difficult doing work/chores Very difficult Very difficult Somewhat difficult      Allergies  Allergen Reactions   Abilify [Aripiprazole] Nausea And Vomiting   Amoxil [Amoxicillin] Diarrhea   Promethazine Hcl Other (See Comments)    Made pt feel "weird"   Seroquel [Quetiapine Fumarate] Other (See Comments)    Pt does not recall reaction   Social History   Social History Narrative   She is originally from [place]. She has traveled to [place], [place], [place], [place], [place], [place], [place], [place], [place]. No international travel. She has dogs. No prior bird, mold, or recent hot tub exposure. She hasn't used  her hot tub in 1.5 years. 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 Previously enjoyed gardening and playing tennis. Helps to care for her mother.   Past Medical History:  Diagnosis Date   Allergy    Anxiety    Arthritis    knees and spine, shoulder   Asthma    Bronchitis    hx - recurrent   Complication of anesthesia    waking up is not easy   Depression    Elevated IgE level 09/12/2017   09/12/2017 IgE 195   GERD (gastroesophageal reflux disease)    History of COVID-19 12/30/2020   Hx of irritable bowel syndrome    x2   Hyperlipidemia    diet controlled - no medication   Hypertension    not taking any  meds at present - under control per patient   Hypokalemia    with PNA admission (2.5)   Hypothyroidism (acquired)    Ischemic cerebrovascular accident (CVA) (HCC)    Migraines    Osteoporosis    Pneumonia    4 episodes; hosp. admission 2014   PONV (postoperative nausea and vomiting)    TIA (transient ischemic attack) 12/17/2020   UC (ulcerative colitis) [facility])    DX'D 2021   Past Surgical History:  Procedure Laterality Date   ANEURYSM COILING     STENT   BREAST SURGERY     implants, then had them removed   COLONOSCOPY     greater 10 yrs ago - ? [facility]-2017 LAST   DILATION AND CURETTAGE OF UTERUS     IR ANGIO INTRA EXTRACRAN SEL INTERNAL CAROTID BILAT MOD SED  11/19/2020   IR ANGIO VERTEBRAL SEL VERTEBRAL BILAT MOD SED  11/19/2020   IR ANGIO VERTEBRAL SEL VERTEBRAL UNI L MOD SED  02/25/2021   IR ANGIO VERTEBRAL SEL VERTEBRAL UNI L MOD SED  01/13/2022   IR ANGIOGRAM FOLLOW UP STUDY  11/19/2020   IR ANGIOGRAM FOLLOW UP STUDY  11/19/2020   IR ANGIOGRAM FOLLOW UP STUDY  11/19/2020   IR ANGIOGRAM FOLLOW UP STUDY  11/19/2020   IR ANGIOGRAM FOLLOW UP STUDY  11/19/2020   IR ANGIOGRAM FOLLOW UP STUDY  11/19/2020   IR ANGIOGRAM FOLLOW UP STUDY  11/19/2020   IR ANGIOGRAM FOLLOW UP STUDY  11/19/2020   IR INTRA CRAN STENT  11/19/2020   IR TRANSCATH/EMBOLIZ  11/19/2020   LAPAROSCOPIC ABDOMINAL EXPLORATION  01/31/1992   endometriosis   ORIF HUMERUS FRACTURE Left 04/01/2013   DR Magnus Ivan - shoulder   ORIF HUMERUS FRACTURE Left 04/01/2013   Procedure: OPEN REDUCTION INTERNAL FIXATION (ORIF) LEFT PROXIMAL HUMERUS FRACTURE;  Surgeon: Kathryne Hitch, MD;  Location: [facility] OR;  Service: Orthopedics;  Laterality: Left;   RADIOLOGY WITH ANESTHESIA N/A 11/19/2020   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;  Surgeon: Lisbeth Renshaw, MD;  Location: [facility] OR;  Service: Radiology;  Laterality: N/A;   TONSILLECTOMY AND ADENOIDECTOMY     Family History  Problem Relation  Age of Onset   Breast cancer Mother    Diabetes Mother    Heart disease Mother    Diabetes Father    Dementia Father    Colon cancer Father    Stroke Paternal Grandmother    Diabetes Paternal Grandmother    Colon polyps Neg Hx    Esophageal cancer Neg Hx    Stomach cancer Neg Hx    Rectal cancer Neg Hx  Crohn's disease Neg Hx    Ulcerative colitis Neg Hx    Allergies as of 10/13/2022       Reactions   Abilify [aripiprazole] Nausea And Vomiting   Amoxil [amoxicillin] Diarrhea   Promethazine Hcl Other (See Comments)   Made pt feel "weird"   Seroquel [quetiapine Fumarate] Other (See Comments)   Pt does not recall reaction        Medication List        Accurate as of October 13, 2022  4:17 PM. If you have any questions, ask your nurse or doctor.          STOP taking these medications    alendronate 70 MG tablet Commonly known as: FOSAMAX Stopped by: Felix Pacini   desvenlafaxine 100 MG 24 hr tablet Commonly known as: Pristiq Stopped by: Felix Pacini       TAKE these medications    amLODipine 10 MG tablet Commonly known as: NORVASC Take 1 tablet (10 mg total) by mouth daily.   aspirin EC 81 MG tablet Take 81 mg by mouth daily. Swallow whole.   atorvastatin 80 MG tablet Commonly known as: LIPITOR Take 1 tablet (80 mg total) by mouth daily.   Breztri Aerosphere 160-9-4.8 MCG/ACT Aero Generic drug: Budeson-Glycopyrrol-Formoterol Inhale 2 puffs into the lungs in the morning and at bedtime.   busPIRone 15 MG tablet Commonly known as: BUSPAR Take 1 tablet (15 mg total) by mouth 2 (two) times daily. What changed:  medication strength how much to take Changed by: Felix Pacini   clonazePAM 0.5 MG tablet Commonly known as: KLONOPIN Take 1 tablet (0.5 mg total) by mouth 2 (two) times daily as needed for anxiety.   clopidogrel 75 MG tablet Commonly known as: PLAVIX Take 1 tablet (75 mg total) by mouth daily.   ezetimibe 10 MG tablet Commonly  known as: Zetia Take 1 tablet (10 mg total) by mouth every evening.   levothyroxine 150 MCG tablet Commonly known as: Synthroid Take 1 tablet (150 mcg total) by mouth daily before breakfast.   pantoprazole 40 MG tablet Commonly known as: PROTONIX Take 1 tablet (40 mg total) by mouth daily.   Vilazodone HCl 40 MG Tabs Commonly known as: VIIBRYD Take 1 tablet (40 mg total) by mouth daily. Started by: Felix Pacini   Vitamin D (Ergocalciferol) 1.25 MG (50000 UNIT) Caps capsule Commonly known as: DRISDOL Take 1 capsule (50,000 Units total) by mouth every 14 (fourteen) days.   VITAMIN D-3 PO Take 1 capsule by mouth daily.   ZINC PO Take by mouth daily.        All past medical history, surgical history, allergies, family history, immunizations andmedications were updated in the EMR today and reviewed under the history and medication portions of their EMR.     ROS Negative, with the exception of above mentioned in HPI   Objective:  BP 118/83   Pulse 75   Temp 98 F (36.7 C)   Wt 172 lb 9.6 oz (78.3 kg)   LMP 01/31/2007   SpO2 97%   BMI 29.63 kg/m  Body mass index is 29.63 kg/m. Physical Exam Vitals and nursing note reviewed.  Constitutional:      General: She is not in acute distress.    Appearance: Normal appearance. She is not ill-appearing, toxic-appearing or diaphoretic.  HENT:     Head: Normocephalic and atraumatic.  Eyes:     General: No scleral icterus.       Right eye: No discharge.  Left eye: No discharge.     Extraocular Movements: Extraocular movements intact.     Conjunctiva/sclera: Conjunctivae normal.     Pupils: Pupils are equal, round, and reactive to light.  Cardiovascular:     Rate and Rhythm: Normal rate and regular rhythm.  Pulmonary:     Effort: Pulmonary effort is normal. No respiratory distress.     Breath sounds: Normal breath sounds. No wheezing, rhonchi or rales.  Musculoskeletal:     Right lower leg: No edema.  Skin:     General: Skin is warm and dry.     Findings: No rash.  Neurological:     Mental Status: She is alert and oriented to person, place, and time. Mental status is at baseline.     Motor: No weakness.     Gait: Gait normal.  Psychiatric:        Attention and Perception: Attention normal.        Mood and Affect: Mood normal. Affect is tearful.        Speech: Speech normal.        Behavior: Behavior normal.        Thought Content: Thought content normal.        Cognition and Memory: Cognition normal.        Judgment: Judgment normal.    No results found. No results found. No results found for this or any previous visit (from the past 24 hour(s)).  Assessment/Plan: Tiffany Horn is a 62 y.o. female present for OV for  Major depressive disorder, recurrent episode, mild (HCC)/anxiety/moodiness Start Viibryd taper half a tab for 1 week, then increase to 1 tab daily Taper back on Pristiq to half a tab for 1 week while starting the Viibryd, stop Pristiq after 1 week Increase buspar 15 mg twice daily Can continue Klonopin as it is for now, hopefully will be to discontinue future Could consider risperidone in the future if needed.   Vitamin D deficiency/B12 deficiency -B12 levels collected today - Vitamin D (25 hydroxy) collected today  Hypothyroidism due to acquired atrophy of thyroid Stable Continue levothyroxine 150 mcg  Labs up-to-date 07/2022  Mixed hyperlipidemia/Primary hypertension/CVA/basilar aneurysm Continue amlodipine 10 mg daily Continue Zetia 10 mg nightly Continue atorvastatin 80 mg nightly Continue Plavix 75 mg daily Continue cardiology and neurology follow-ups - Comp Met (CMET) - CBC - Lipid panel - Hemoglobin A1c  Homocystinemia/B12 deficiency - B12 and Folate Panel -B12 injection provided today  Influenza vaccine needed - Flu vaccine trivalent PF, 6mos and older(Flulaval,Afluria,Fluarix,Fluzone)  Reviewed expectations re: course of current medical  issues. Discussed self-management of symptoms. Outlined signs and symptoms indicating need for more acute intervention. Patient verbalized understanding and all questions were answered. Patient received an After-Visit Summary.  Return in about 6 weeks (around 11/24/2022).   Orders Placed This Encounter  Procedures   Flu vaccine trivalent PF, 6mos and older(Flulaval,Afluria,Fluarix,Fluzone)   Comp Met (CMET)   CBC   Lipid panel   Hemoglobin A1c   Vitamin D (25 hydroxy)   B12 and Folate Panel   Meds ordered this encounter  Medications   Vilazodone HCl (VIIBRYD) 40 MG TABS    Sig: Take 1 tablet (40 mg total) by mouth daily.    Dispense:  90 tablet    Refill:  1    DC pristiq   busPIRone (BUSPAR) 15 MG tablet    Sig: Take 1 tablet (15 mg total) by mouth 2 (two) times daily.    Dispense:  180 tablet  Refill:  1   ezetimibe (ZETIA) 10 MG tablet    Sig: Take 1 tablet (10 mg total) by mouth every evening.    Dispense:  90 tablet    Refill:  3   atorvastatin (LIPITOR) 80 MG tablet    Sig: Take 1 tablet (80 mg total) by mouth daily.    Dispense:  90 tablet    Refill:  1    Hold until patient requests   clopidogrel (PLAVIX) 75 MG tablet    Sig: Take 1 tablet (75 mg total) by mouth daily.    Dispense:  90 tablet    Refill:  1   levothyroxine (SYNTHROID) 150 MCG tablet    Sig: Take 1 tablet (150 mcg total) by mouth daily before breakfast.    Dispense:  90 tablet    Refill:  1   amLODipine (NORVASC) 10 MG tablet    Sig: Take 1 tablet (10 mg total) by mouth daily.    Dispense:  90 tablet    Refill:  1   Referral Orders  No referral(s) requested today     Note is dictated utilizing voice recognition software. Although note has been proof read prior to signing, occasional typographical errors still can be missed. If any questions arise, please do not hesitate to call for verification.   electronically signed by:  Felix Pacini, DO  [facility] - [place]

## 2022-10-13 NOTE — Progress Notes (Signed)
Tiffany Horn , [birth year], 62 y.o., female MRN: [id number] Patient Care Team    Relationship Specialty Notifications Start End  Natalia Leatherwood, DO PCP - General Family Medicine  08/21/22   Jake Bathe, MD PCP - Cardiology Cardiology  08/21/22   Napoleon Form, MD Consulting Physician Gastroenterology  05/29/16   Ernestina Penna, MD Referring Physician Obstetrics and Gynecology  07/17/17   Josephine Igo, DO Consulting Physician Pulmonary Disease  06/12/19   Patton Salles, MD Consulting Physician Obstetrics and Gynecology  08/30/22     No chief complaint on file.    Subjective: Tiffany Horn is a  62 y.o. Pt presents for an OV to discuss results and further plan for depression treatment after genesight testing completed and resulted.    Prior note: with concerns of continued fatigue.  She reports compliance with her thyroid medication which has been under supplemented in the past from compliance.  She reports her asthma seems to be well-controlled.  Has a history of CVA.  She follows closely with her neurosurgical team.  She has a history of aneurysms as well.  She is wondering if she is lacking in any vitamins that could be creating her fatigue.  She has had B12 deficiency in the past and was on B12 injections. Reports she never feels rested however over the last month things have become "rough.  "She reports she just does not feel good she feels like something is wrong, "not right.  "She has been more moody and irritable. She is seeing a therapist and had her significant other go to therapy session with her to discuss his drinking patterns effects on the relationship.     09/05/2022    2:08 PM 07/05/2022    9:34 AM 09/13/2021    2:52 PM 04/12/2021    2:14 PM 03/07/2021    8:33 AM  Depression screen PHQ 2/9  Decreased Interest 2 3 1 1  0  Down, Depressed, Hopeless 3 3 0 1 0  PHQ - 2 Score 5 6 1 2  0  Altered sleeping 3 3 3  0   Tired, decreased energy 2 3 2  0    Change in appetite 3 1 0 0   Feeling bad or failure about yourself  3 1 0 0   Trouble concentrating 2 2 2  0   Moving slowly or fidgety/restless 3 0 0 0   Suicidal thoughts 1 0 0 0   PHQ-9 Score 22 16 8 2    Difficult doing work/chores Very difficult Somewhat difficult       Allergies  Allergen Reactions   Abilify [Aripiprazole] Nausea And Vomiting   Amoxil [Amoxicillin] Diarrhea   Promethazine Hcl Other (See Comments)    Made pt feel "weird"   Seroquel [Quetiapine Fumarate] Other (See Comments)    Pt does not recall reaction   Social History   Social History Narrative   She is originally from [place]. She has traveled to [place], [place], [place], [place], [place], [place], [place], [place], [place]. No international travel. She has dogs. No prior bird, mold, or recent hot tub exposure. She hasn't used her hot tub in 1.5 years. 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 Previously enjoyed gardening and playing tennis. Helps to care for her mother.   Past Medical History:  Diagnosis Date   Allergy    Anxiety    Arthritis  knees and spine, shoulder   Asthma    Bronchitis    hx - recurrent   Complication of anesthesia    waking up is not easy   Depression    Elevated IgE level 09/12/2017   09/12/2017 IgE 195   GERD (gastroesophageal reflux disease)    History of COVID-19 12/30/2020   Hx of irritable bowel syndrome    x2   Hyperlipidemia    diet controlled - no medication   Hypertension    not taking any meds at present - under control per patient   Hypokalemia    with PNA admission (2.5)   Hypothyroidism (acquired)    Ischemic cerebrovascular accident (CVA) (HCC)    Migraines    Osteoporosis    Pneumonia    4 episodes; hosp. admission 2014   PONV (postoperative nausea and vomiting)    TIA (transient ischemic attack) 12/17/2020   UC (ulcerative colitis) [facility])    DX'D 2021   Past Surgical History:  Procedure Laterality Date   ANEURYSM COILING      STENT   BREAST SURGERY     implants, then had them removed   COLONOSCOPY     greater 10 yrs ago - ? [facility]-2017 LAST   DILATION AND CURETTAGE OF UTERUS     IR ANGIO INTRA EXTRACRAN SEL INTERNAL CAROTID BILAT MOD SED  11/19/2020   IR ANGIO VERTEBRAL SEL VERTEBRAL BILAT MOD SED  11/19/2020   IR ANGIO VERTEBRAL SEL VERTEBRAL UNI L MOD SED  02/25/2021   IR ANGIO VERTEBRAL SEL VERTEBRAL UNI L MOD SED  01/13/2022   IR ANGIOGRAM FOLLOW UP STUDY  11/19/2020   IR ANGIOGRAM FOLLOW UP STUDY  11/19/2020   IR ANGIOGRAM FOLLOW UP STUDY  11/19/2020   IR ANGIOGRAM FOLLOW UP STUDY  11/19/2020   IR ANGIOGRAM FOLLOW UP STUDY  11/19/2020   IR ANGIOGRAM FOLLOW UP STUDY  11/19/2020   IR ANGIOGRAM FOLLOW UP STUDY  11/19/2020   IR ANGIOGRAM FOLLOW UP STUDY  11/19/2020   IR INTRA CRAN STENT  11/19/2020   IR TRANSCATH/EMBOLIZ  11/19/2020   LAPAROSCOPIC ABDOMINAL EXPLORATION  01/31/1992   endometriosis   ORIF HUMERUS FRACTURE Left 04/01/2013   DR Magnus Ivan - shoulder   ORIF HUMERUS FRACTURE Left 04/01/2013   Procedure: OPEN REDUCTION INTERNAL FIXATION (ORIF) LEFT PROXIMAL HUMERUS FRACTURE;  Surgeon: Kathryne Hitch, MD;  Location: [facility] OR;  Service: Orthopedics;  Laterality: Left;   RADIOLOGY WITH ANESTHESIA N/A 11/19/2020   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;  Surgeon: Lisbeth Renshaw, MD;  Location: [facility] OR;  Service: Radiology;  Laterality: N/A;   TONSILLECTOMY AND ADENOIDECTOMY     Family History  Problem Relation Age of Onset   Breast cancer Mother    Diabetes Mother    Heart disease Mother    Diabetes Father    Dementia Father    Colon cancer Father    Stroke Paternal Grandmother    Diabetes Paternal Grandmother    Colon polyps Neg Hx    Esophageal cancer Neg Hx    Stomach cancer Neg Hx    Rectal cancer Neg Hx    Crohn's disease Neg Hx    Ulcerative colitis Neg Hx    Allergies as of 10/13/2022       Reactions   Abilify [aripiprazole] Nausea And  Vomiting   Amoxil [amoxicillin] Diarrhea   Promethazine Hcl Other (See Comments)   Made pt feel "weird"   Seroquel [quetiapine Fumarate] Other (See Comments)  Pt does not recall reaction        Medication List        Accurate as of October 13, 2022  7:50 AM. If you have any questions, ask your nurse or doctor.          alendronate 70 MG tablet Commonly known as: FOSAMAX Take 1 tablet (70 mg total) by mouth every 7 (seven) days. Take with a full glass of water on an empty stomach.   amLODipine 10 MG tablet Commonly known as: NORVASC TAKE 1 TABLET BY MOUTH EVERY DAY   aspirin EC 81 MG tablet Take 81 mg by mouth daily. Swallow whole.   atorvastatin 80 MG tablet Commonly known as: LIPITOR Take 1 tablet (80 mg total) by mouth daily.   Breztri Aerosphere 160-9-4.8 MCG/ACT Aero Generic drug: Budeson-Glycopyrrol-Formoterol Inhale 2 puffs into the lungs in the morning and at bedtime.   busPIRone 10 MG tablet Commonly known as: BUSPAR Take 1 tablet (10 mg total) by mouth 2 (two) times daily.   clonazePAM 0.5 MG tablet Commonly known as: KLONOPIN Take 1 tablet (0.5 mg total) by mouth 2 (two) times daily as needed for anxiety.   clopidogrel 75 MG tablet Commonly known as: PLAVIX Take 1 tablet (75 mg total) by mouth daily.   desvenlafaxine 100 MG 24 hr tablet Commonly known as: Pristiq Take 1 tablet (100 mg total) by mouth daily.   ezetimibe 10 MG tablet Commonly known as: Zetia Take 1 tablet (10 mg total) by mouth daily. What changed: when to take this   levothyroxine 150 MCG tablet Commonly known as: Synthroid Take 1 tablet (150 mcg total) by mouth daily before breakfast.   pantoprazole 40 MG tablet Commonly known as: PROTONIX Take 1 tablet (40 mg total) by mouth daily.   Vitamin D (Ergocalciferol) 1.25 MG (50000 UNIT) Caps capsule Commonly known as: DRISDOL Take 1 capsule (50,000 Units total) by mouth every 14 (fourteen) days.   VITAMIN D-3 PO Take 1  capsule by mouth daily.   ZINC PO Take by mouth daily.        All past medical history, surgical history, allergies, family history, immunizations andmedications were updated in the EMR today and reviewed under the history and medication portions of their EMR.     ROS Negative, with the exception of above mentioned in HPI   Objective:  LMP 01/31/2007  There is no height or weight on file to calculate BMI. Physical Exam Vitals and nursing note reviewed.  Constitutional:      General: She is not in acute distress.    Appearance: Normal appearance. She is not toxic-appearing.  HENT:     Head: Normocephalic and atraumatic.  Eyes:     General: No scleral icterus.       Right eye: No discharge.        Left eye: No discharge.     Conjunctiva/sclera: Conjunctivae normal.  Pulmonary:     Effort: Pulmonary effort is normal.  Musculoskeletal:     Cervical back: Normal range of motion.  Skin:    Findings: No rash.  Neurological:     Mental Status: She is alert and oriented to person, place, and time. Mental status is at baseline.     Comments: Tearful.  Psychiatric:        Mood and Affect: Mood normal.        Behavior: Behavior normal.        Thought Content: Thought content normal.  Judgment: Judgment normal.    No results found. No results found. No results found for this or any previous visit (from the past 24 hour(s)).  Assessment/Plan: Tiffany Horn is a 62 y.o. female present for OV for  Major depressive disorder, recurrent episode, mild (HCC)/anxiety/moodiness After discussion on results and best options for patient, it was decided to: - taper off paxil down to 20 mg 2 weeks, then stop.  Start pristiq 50 mg every day.  Start buspar 5 mg BID in 2-3 days after pristiq F/u 6 months - sooner if needed  Reviewed expectations re: course of current medical issues. Discussed self-management of symptoms. Outlined signs and symptoms indicating need for more  acute intervention. Patient verbalized understanding and all questions were answered. Patient received an After-Visit Summary.   Dependent upon laboratory results and GeneSight testing results No orders of the defined types were placed in this encounter.  No orders of the defined types were placed in this encounter.  Referral Orders  No referral(s) requested today     Note is dictated utilizing voice recognition software. Although note has been proof read prior to signing, occasional typographical errors still can be missed. If any questions arise, please do not hesitate to call for verification.   electronically signed by:  Felix Pacini, DO  [facility] - [place]

## 2022-10-13 NOTE — Patient Instructions (Addendum)
Week 1: 1/2 tab of pristiq and 1/2 tab viibryd Week 2: stop pristiq and increase viibryd to 1 full tab daily.  Buspar increased to 15 mg daily. New script written.   Return in about 6 weeks (around 11/24/2022).        Great to see you today.  I have refilled the medication(s) we provide.   If labs were collected or images ordered, we will inform you of  results once we have received them and reviewed. We will contact you either by echart message, or telephone call.  Please give ample time to the testing facility, and our office to run,  receive and review results. Please do not call inquiring of results, even if you can see them in your chart. We will contact you as soon as we are able. If it has been over 1 week since the test was completed, and you have not yet heard from [place], then please call us.    - echart message- for normal results that have been seen by the patient already.   - telephone call: abnormal results or if patient has not viewed results in their echart.  If a referral to a specialist was entered for you, please call us in 2 weeks if you have not heard from the specialist office to schedule.

## 2022-10-16 ENCOUNTER — Telehealth: Payer: Self-pay | Admitting: Family Medicine

## 2022-10-16 DIAGNOSIS — E538 Deficiency of other specified B group vitamins: Secondary | ICD-10-CM | POA: Insufficient documentation

## 2022-10-16 MED ORDER — FOLIC ACID 1 MG PO TABS: 1.0000 mg | ORAL_TABLET | Freq: Every day | ORAL | 1 refills | Status: DC

## 2022-10-16 MED ORDER — B-12 1000 MCG SL SUBL: 1000.0000 ug | SUBLINGUAL_TABLET | Freq: Every day | SUBLINGUAL | 3 refills | Status: DC

## 2022-10-16 NOTE — Telephone Encounter (Signed)
Spoke with patient regarding results/recommendations.

## 2022-10-16 NOTE — Telephone Encounter (Signed)
Please call patient Liver and kidney function are normal Blood cell counts and electrolytes are normal Diabetes screening/A1c is normal at 5.6 Cholesterol panel at goal for her, continue atorvastatin and Zetia.  Patient's folate, B12 and vitamin D are low. Vitamin D is just mildly low at 29.65.  Continue current supplementation I have called in folic acid to take daily. I have called in a sublingual B12 to take daily.  She also can continue our schedule nurse injections monthly.

## 2022-10-20 ENCOUNTER — Ambulatory Visit: Payer: BC Managed Care – PPO | Admitting: Family Medicine

## 2022-10-25 ENCOUNTER — Telehealth: Payer: Self-pay | Admitting: Family Medicine

## 2022-10-25 ENCOUNTER — Encounter: Payer: Self-pay | Admitting: Family Medicine

## 2022-10-25 ENCOUNTER — Ambulatory Visit: Payer: BC Managed Care – PPO | Admitting: Family Medicine

## 2022-10-25 VITALS — BP 116/75 | HR 98 | Temp 98.7°F | Ht 64.0 in | Wt 172.8 lb

## 2022-10-25 DIAGNOSIS — F411 Generalized anxiety disorder: Secondary | ICD-10-CM

## 2022-10-25 DIAGNOSIS — T50905A Adverse effect of unspecified drugs, medicaments and biological substances, initial encounter: Secondary | ICD-10-CM

## 2022-10-25 DIAGNOSIS — L304 Erythema intertrigo: Secondary | ICD-10-CM

## 2022-10-25 MED ORDER — CLOTRIMAZOLE-BETAMETHASONE 1-0.05 % EX CREA: 1.0000 | TOPICAL_CREAM | Freq: Every day | CUTANEOUS | 0 refills | Status: DC

## 2022-10-25 MED ORDER — DESVENLAFAXINE SUCCINATE ER 50 MG PO TB24: 50.0000 mg | ORAL_TABLET | Freq: Every day | ORAL | 0 refills | Status: DC

## 2022-10-25 NOTE — Patient Instructions (Signed)
I have sent in a prescription cream to apply to the rash underneath your breast.  Also apply over-the-counter A&D ointment to the area a couple of times a day.  Stop Viibryd and BuSpar. I have sent in a 50 mg tablet of Pristiq to take once a day, okay to start this tomorrow.

## 2022-10-25 NOTE — Telephone Encounter (Signed)
Patient called this morning stating reaction to medications Vibryd and Buspirone.  She states Friday and Sat she had trouble sleeping and having vivant dreams. She reports no appetite, diarrhea and having the shakes. Patient was transferred to triage.

## 2022-10-25 NOTE — Telephone Encounter (Signed)
Pt was scheduled for appt by triage

## 2022-10-25 NOTE — Progress Notes (Unsigned)
OFFICE VISIT  10/26/2022  CC:  Chief Complaint  Patient presents with   Medication Problem    X 5 days, (Viibryd and Buspar), has been having restless, agitated, unable to sleep, nausea, headache, dizzy, severe heart palpitations, rash ( under L breast), and shaking (this AM)    Patient is a 62 y.o. female who presents for "medication reaction".  HPI: About the last 10 days she has felt gradually increasing restlessness and agitation, sleep dysfunction, nausea, headaches and dizziness, palpitations, and tremulousness.  Symptoms peaked yesterday, describes a near panic attack. The symptoms coincide with some alterations in her antidepressant regimen.  She weaned off Pristiq while starting Viibryd at 20 mg dose.  Once she was off Pristiq she increase the Viibryd to 40 mg.  During this time she had also started taking BuSpar. She continued to take these meds through her recent symptoms although she chose not to take them today.  The last 10 days she has had a pink, itchy rash under her left breast.  Past Medical History:  Diagnosis Date   Allergy    Anxiety    Arthritis    knees and spine, shoulder   Asthma    Bronchitis    hx - recurrent   Complication of anesthesia    waking up is not easy   Depression    Elevated IgE level 09/12/2017   09/12/2017 IgE 195   GERD (gastroesophageal reflux disease)    History of COVID-19 12/30/2020   Hx of irritable bowel syndrome    x2   Hyperlipidemia    diet controlled - no medication   Hypertension    not taking any meds at present - under control per patient   Hypokalemia    with PNA admission (2.5)   Hypothyroidism (acquired)    Ischemic cerebrovascular accident (CVA) (HCC)    Migraines    Osteoporosis    Pneumonia    4 episodes; hosp. admission 2014   PONV (postoperative nausea and vomiting)    TIA (transient ischemic attack) 12/17/2020   UC (ulcerative colitis) [facility])    DX'D 2021    Past Surgical History:  Procedure  Laterality Date   ANEURYSM COILING     STENT   BREAST SURGERY     implants, then had them removed   COLONOSCOPY     greater 10 yrs ago - ? [facility]-2017 LAST   DILATION AND CURETTAGE OF UTERUS     IR ANGIO INTRA EXTRACRAN SEL INTERNAL CAROTID BILAT MOD SED  11/19/2020   IR ANGIO VERTEBRAL SEL VERTEBRAL BILAT MOD SED  11/19/2020   IR ANGIO VERTEBRAL SEL VERTEBRAL UNI L MOD SED  02/25/2021   IR ANGIO VERTEBRAL SEL VERTEBRAL UNI L MOD SED  01/13/2022   IR ANGIOGRAM FOLLOW UP STUDY  11/19/2020   IR ANGIOGRAM FOLLOW UP STUDY  11/19/2020   IR ANGIOGRAM FOLLOW UP STUDY  11/19/2020   IR ANGIOGRAM FOLLOW UP STUDY  11/19/2020   IR ANGIOGRAM FOLLOW UP STUDY  11/19/2020   IR ANGIOGRAM FOLLOW UP STUDY  11/19/2020   IR ANGIOGRAM FOLLOW UP STUDY  11/19/2020   IR ANGIOGRAM FOLLOW UP STUDY  11/19/2020   IR INTRA CRAN STENT  11/19/2020   IR TRANSCATH/EMBOLIZ  11/19/2020   LAPAROSCOPIC ABDOMINAL EXPLORATION  01/31/1992   endometriosis   ORIF HUMERUS FRACTURE Left 04/01/2013   DR Magnus Ivan - shoulder   ORIF HUMERUS FRACTURE Left 04/01/2013   Procedure: OPEN REDUCTION INTERNAL FIXATION (ORIF) LEFT PROXIMAL HUMERUS FRACTURE;  Surgeon: Kathryne Hitch, MD;  Location: [facility] OR;  Service: Orthopedics;  Laterality: Left;   RADIOLOGY WITH ANESTHESIA N/A 11/19/2020   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;  Surgeon: Lisbeth Renshaw, MD;  Location: [facility] OR;  Service: Radiology;  Laterality: N/A;   TONSILLECTOMY AND ADENOIDECTOMY      Outpatient Medications Prior to Visit  Medication Sig Dispense Refill   amLODipine (NORVASC) 10 MG tablet Take 1 tablet (10 mg total) by mouth daily. 90 tablet 1   aspirin EC 81 MG tablet Take 81 mg by mouth daily. Swallow whole.     atorvastatin (LIPITOR) 80 MG tablet Take 1 tablet (80 mg total) by mouth daily. 90 tablet 1   Budeson-Glycopyrrol-Formoterol (BREZTRI AEROSPHERE) 160-9-4.8 MCG/ACT AERO Inhale 2 puffs into the lungs in the morning and  at bedtime. 5.9 g 1   Cholecalciferol (VITAMIN D-3 PO) Take 1 capsule by mouth daily.     clonazePAM (KLONOPIN) 0.5 MG tablet Take 1 tablet (0.5 mg total) by mouth 2 (two) times daily as needed for anxiety. 180 tablet 1   clopidogrel (PLAVIX) 75 MG tablet Take 1 tablet (75 mg total) by mouth daily. 90 tablet 1   Cyanocobalamin (B-12) 1000 MCG SUBL Place 1,000 mcg under the tongue daily. 90 tablet 3   ezetimibe (ZETIA) 10 MG tablet Take 1 tablet (10 mg total) by mouth every evening. 90 tablet 3   folic acid (FOLVITE) 1 MG tablet Take 1 tablet (1 mg total) by mouth daily. 90 tablet 1   levothyroxine (SYNTHROID) 150 MCG tablet Take 1 tablet (150 mcg total) by mouth daily before breakfast. 90 tablet 1   Multiple Vitamins-Minerals (ZINC PO) Take by mouth daily.     pantoprazole (PROTONIX) 40 MG tablet Take 1 tablet (40 mg total) by mouth daily. 90 tablet 3   Vitamin D, Ergocalciferol, (DRISDOL) 1.25 MG (50000 UNIT) CAPS capsule Take 1 capsule (50,000 Units total) by mouth every 14 (fourteen) days. 6 capsule 3   busPIRone (BUSPAR) 15 MG tablet Take 1 tablet (15 mg total) by mouth 2 (two) times daily. 180 tablet 1   Vilazodone HCl (VIIBRYD) 40 MG TABS Take 1 tablet (40 mg total) by mouth daily. 90 tablet 1   cyanocobalamin (VITAMIN B12) injection 1,000 mcg      No facility-administered medications prior to visit.    Allergies  Allergen Reactions   Abilify [Aripiprazole] Nausea And Vomiting   Amoxil [Amoxicillin] Diarrhea   Promethazine Hcl Other (See Comments)    Made pt feel "weird"   Seroquel [Quetiapine Fumarate] Other (See Comments)    Pt does not recall reaction    Review of Systems  As per HPI  PE:    10/25/2022    2:48 PM 10/13/2022    8:57 AM 09/05/2022    2:01 PM  Vitals with BMI  Height 5\' 4"     Weight 172 lbs 13 oz 172 lbs 10 oz 174 lbs 3 oz  BMI 29.65  29.89  Systolic 116 118 161  Diastolic 75 83 83  Pulse 98 75 103     Physical Exam  Exam chaperoned by Emi Holes, CMA. Gen: Alert, well appearing.  Patient is oriented to person, place, time, and situation. AFFECT: pleasant, lucid thought and speech. Skin: Left inframammary region with baseball sized pink macular rash with some small areas of maceration. CV: RRR, no m/r/g.   LUNGS: CTA bilat, nonlabored resps, good aeration in all lung fields. Neuro: CN 2-12 intact bilaterally, strength  5/5 in proximal and distal upper extremities and lower extremities bilaterally.  No tremor.  No disdiadochokinesis.  No ataxia.  Upper extremity and lower extremity DTRs symmetric.  No pronator drift.   LABS:  Last CBC Lab Results  Component Value Date   WBC 4.8 10/13/2022   HGB 14.2 10/13/2022   HCT 43.7 10/13/2022   MCV 92.0 10/13/2022   MCH 29.8 08/21/2022   RDW 13.0 10/13/2022   PLT 247.0 10/13/2022   Last metabolic panel Lab Results  Component Value Date   GLUCOSE 88 10/13/2022   NA 141 10/13/2022   K 4.2 10/13/2022   CL 105 10/13/2022   CO2 30 10/13/2022   BUN 11 10/13/2022   CREATININE 0.75 10/13/2022   GFR 86.59 10/13/2022   CALCIUM 9.2 10/13/2022   PHOS 4.8 (H) 12/06/2020   PROT 6.5 10/13/2022   ALBUMIN 3.9 10/13/2022   BILITOT 0.4 10/13/2022   ALKPHOS 72 10/13/2022   AST 14 10/13/2022   ALT 14 10/13/2022   ANIONGAP 9 08/21/2022   IMPRESSION AND PLAN:  #1 medication reaction. Some subtle/mild serotonin syndrome symptoms. Stop Viibryd and BuSpar. Will get her back on a 50 mg dose of Pristiq tomorrow.  #2 intertrigo. Lotrisone cream prescribed today. Also recommended A&D ointment.  We spent some time reviewing her med list and answering questions about the timing of dosing of some of these. I answered these questions to the best of my ability but also encouraged her to go over this with her PCP.  An After Visit Summary was printed and given to the patient.  Spent 31 min with pt today reviewing HPI, reviewing relevant past history, doing exam, reviewing and discussing lab  and imaging data, and formulating plans.  FOLLOW UP: Return in about 1 week (around 11/01/2022) for Follow-up med reaction, anxiety.  Signed:  Santiago Bumpers, MD           10/26/2022

## 2022-11-03 ENCOUNTER — Encounter: Payer: Self-pay | Admitting: Family Medicine

## 2022-11-03 ENCOUNTER — Ambulatory Visit: Payer: BC Managed Care – PPO | Admitting: Family Medicine

## 2022-11-03 VITALS — BP 122/72 | HR 76 | Temp 98.3°F | Wt 173.6 lb

## 2022-11-03 DIAGNOSIS — E538 Deficiency of other specified B group vitamins: Secondary | ICD-10-CM | POA: Diagnosis not present

## 2022-11-03 DIAGNOSIS — F419 Anxiety disorder, unspecified: Secondary | ICD-10-CM | POA: Diagnosis not present

## 2022-11-03 DIAGNOSIS — F330 Major depressive disorder, recurrent, mild: Secondary | ICD-10-CM

## 2022-11-03 MED ORDER — DESVENLAFAXINE SUCCINATE ER 50 MG PO TB24: 50.0000 mg | ORAL_TABLET | Freq: Every day | ORAL | 1 refills | Status: DC

## 2022-11-03 MED ORDER — CYANOCOBALAMIN 1000 MCG/ML IJ SOLN
1000.0000 ug | Freq: Once | INTRAMUSCULAR | Status: AC
Start: 2022-11-03 — End: 2022-11-02
  Administered 2022-11-02: 1000 ug via INTRAMUSCULAR

## 2022-11-03 NOTE — Progress Notes (Signed)
Tiffany Horn , 11-25-1960, 62 y.o., female MRN: [id number] Patient Care Team    Relationship Specialty Notifications Start End  Natalia Leatherwood, DO PCP - General Family Medicine  08/21/22   Jake Bathe, MD PCP - Cardiology Cardiology  08/21/22   Napoleon Form, MD Consulting Physician Gastroenterology  05/29/16   Ernestina Penna, MD Referring Physician Obstetrics and Gynecology  07/17/17   Josephine Igo, DO Consulting Physician Pulmonary Disease  06/12/19   Patton Salles, MD Consulting Physician Obstetrics and Gynecology  08/30/22     Chief Complaint  Patient presents with   Depression    Change in med therapy     Subjective: Tiffany Horn is a  62 y.o. Pt presents for an OV  Major depressive disorder, recurrent episode, mild (HCC)/Anxiety At the last appointment patient was weaning off the Pristiq and started the Viibryd due to reports of inadequate coverage.  However after the Pristiq was completely tapered off she endorsed having panic and could not sleep.  She was seen by another provider which took her off the Viibryd and restarted Pristiq at 50 mg a day, and since that time she states she is doing really well.  She is no longer taking the BuSpar either  Prior note: Patient reports she feels about the same as she did on her prior medication regimens and does not see great improvement on the Pristiq/BuSpar regimen, despite acknowledging the recommendations of best suited meds for her by [facility] testing.  She is attending therapy twice a week.  Has tapered up to the Pristiq 100 mg daily and BuSpar 10 mg twice daily for about 5 weeks now.  She reports she is getting out and walking daily.  She has been socializing with her friends and not isolating.  She has been hit with some unfortunate circumstances where she has lost her job and is looking for a new job.  She had a friend passed away from colon cancer unexpectedly.  One of her 16 year old dogs recently  died.  She has been in contact with her ex-boyfriend and is considering getting back together with him.      11/03/2022    1:30 PM 10/13/2022    9:01 AM 09/05/2022    2:08 PM 07/05/2022    9:34 AM 09/13/2021    2:52 PM  Depression screen PHQ 2/9  Decreased Interest 1 2 2 3 1   Down, Depressed, Hopeless 1 3 3 3  0  PHQ - 2 Score 2 5 5 6 1   Altered sleeping 2 2 3 3 3   Tired, decreased energy 1 3 2 3 2   Change in appetite 0 3 3 1  0  Feeling bad or failure about yourself  0 2 3 1  0  Trouble concentrating 1 3 2 2 2   Moving slowly or fidgety/restless 0 2 3 0 0  Suicidal thoughts 0 0 1 0 0  PHQ-9 Score 6 20 22 16 8   Difficult doing work/chores Somewhat difficult Very difficult Very difficult Somewhat difficult     Allergies  Allergen Reactions   Abilify [Aripiprazole] Nausea And Vomiting   Amoxil [Amoxicillin] Diarrhea   Promethazine Hcl Other (See Comments)    Made pt feel "weird"   Seroquel [Quetiapine Fumarate] Other (See Comments)    Pt does not recall reaction   Social History   Social History Narrative   She is originally from [place]. She has traveled to [facility], [place], [place], [place], [place],  [place], [place], [place], [place],& [place]. No international travel. She has dogs. No prior bird, mold, or recent hot tub exposure. She hasn't used her hot tub in 1.5 years. 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 Previously enjoyed gardening and playing tennis. Helps to care for her mother.   Past Medical History:  Diagnosis Date   Allergy    Anxiety    Arthritis    knees and spine, shoulder   Asthma    Bronchitis    hx - recurrent   Complication of anesthesia    waking up is not easy   Depression    Elevated IgE level 09/12/2017   09/12/2017 IgE 195   GERD (gastroesophageal reflux disease)    History of COVID-19 12/30/2020   Hx of irritable bowel syndrome    x2   Hyperlipidemia    diet controlled - no medication   Hypertension    not taking any meds at  present - under control per patient   Hypokalemia    with PNA admission (2.5)   Hypothyroidism (acquired)    Ischemic cerebrovascular accident (CVA) (HCC)    Migraines    Osteoporosis    Pneumonia    4 episodes; hosp. admission 2014   PONV (postoperative nausea and vomiting)    TIA (transient ischemic attack) 12/17/2020   UC (ulcerative colitis) [facility])    DX'D 2021   Past Surgical History:  Procedure Laterality Date   ANEURYSM COILING     STENT   BREAST SURGERY     implants, then had them removed   COLONOSCOPY     greater 10 yrs ago - ? [facility]-2017 LAST   DILATION AND CURETTAGE OF UTERUS     IR ANGIO INTRA EXTRACRAN SEL INTERNAL CAROTID BILAT MOD SED  11/19/2020   IR ANGIO VERTEBRAL SEL VERTEBRAL BILAT MOD SED  11/19/2020   IR ANGIO VERTEBRAL SEL VERTEBRAL UNI L MOD SED  02/25/2021   IR ANGIO VERTEBRAL SEL VERTEBRAL UNI L MOD SED  01/13/2022   IR ANGIOGRAM FOLLOW UP STUDY  11/19/2020   IR ANGIOGRAM FOLLOW UP STUDY  11/19/2020   IR ANGIOGRAM FOLLOW UP STUDY  11/19/2020   IR ANGIOGRAM FOLLOW UP STUDY  11/19/2020   IR ANGIOGRAM FOLLOW UP STUDY  11/19/2020   IR ANGIOGRAM FOLLOW UP STUDY  11/19/2020   IR ANGIOGRAM FOLLOW UP STUDY  11/19/2020   IR ANGIOGRAM FOLLOW UP STUDY  11/19/2020   IR INTRA CRAN STENT  11/19/2020   IR TRANSCATH/EMBOLIZ  11/19/2020   LAPAROSCOPIC ABDOMINAL EXPLORATION  01/31/1992   endometriosis   ORIF HUMERUS FRACTURE Left 04/01/2013   DR Magnus Ivan - shoulder   ORIF HUMERUS FRACTURE Left 04/01/2013   Procedure: OPEN REDUCTION INTERNAL FIXATION (ORIF) LEFT PROXIMAL HUMERUS FRACTURE;  Surgeon: Kathryne Hitch, MD;  Location: [facility] OR;  Service: Orthopedics;  Laterality: Left;   RADIOLOGY WITH ANESTHESIA N/A 11/19/2020   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;  Surgeon: Lisbeth Renshaw, MD;  [facility] OR;  Service: Radiology;  Laterality: N/A;   TONSILLECTOMY AND ADENOIDECTOMY     Family History  Problem Relation Age of  Onset   Breast cancer Mother    Diabetes Mother    Heart disease Mother    Diabetes Father    Dementia Father    Colon cancer Father    Stroke Paternal Grandmother    Diabetes Paternal Grandmother    Colon polyps Neg Hx  Esophageal cancer Neg Hx    Stomach cancer Neg Hx    Rectal cancer Neg Hx    Crohn's disease Neg Hx    Ulcerative colitis Neg Hx    Allergies as of 11/03/2022       Reactions   Abilify [aripiprazole] Nausea And Vomiting   Amoxil [amoxicillin] Diarrhea   Promethazine Hcl Other (See Comments)   Made pt feel "weird"   Seroquel [quetiapine Fumarate] Other (See Comments)   Pt does not recall reaction        Medication List        Accurate as of November 03, 2022  1:49 PM. If you have any questions, ask your nurse or doctor.          amLODipine 10 MG tablet Commonly known as: NORVASC Take 1 tablet (10 mg total) by mouth daily.   aspirin EC 81 MG tablet Take 81 mg by mouth daily. Swallow whole.   atorvastatin 80 MG tablet Commonly known as: LIPITOR Take 1 tablet (80 mg total) by mouth daily.   B-12 1000 MCG Subl Place 1,000 mcg under the tongue daily.   Breztri Aerosphere 160-9-4.8 MCG/ACT Aero Generic drug: Budeson-Glycopyrrol-Formoterol Inhale 2 puffs into the lungs in the morning and at bedtime.   clonazePAM 0.5 MG tablet Commonly known as: KLONOPIN Take 1 tablet (0.5 mg total) by mouth 2 (two) times daily as needed for anxiety.   clopidogrel 75 MG tablet Commonly known as: PLAVIX Take 1 tablet (75 mg total) by mouth daily.   clotrimazole-betamethasone cream Commonly known as: LOTRISONE Apply 1 Application topically daily.   desvenlafaxine 50 MG 24 hr tablet Commonly known as: Pristiq Take 1 tablet (50 mg total) by mouth daily.   ezetimibe 10 MG tablet Commonly known as: Zetia Take 1 tablet (10 mg total) by mouth every evening.   folic acid 1 MG tablet Commonly known as: FOLVITE Take 1 tablet (1 mg total) by mouth daily.    levothyroxine 150 MCG tablet Commonly known as: Synthroid Take 1 tablet (150 mcg total) by mouth daily before breakfast.   pantoprazole 40 MG tablet Commonly known as: PROTONIX Take 1 tablet (40 mg total) by mouth daily.   Vitamin D (Ergocalciferol) 1.25 MG (50000 UNIT) Caps capsule Commonly known as: DRISDOL Take 1 capsule (50,000 Units total) by mouth every 14 (fourteen) days.   VITAMIN D-3 PO Take 1 capsule by mouth daily.   ZINC PO Take by mouth daily.        All past medical history, surgical history, allergies, family history, immunizations andmedications were updated in the EMR today and reviewed under the history and medication portions of their EMR.     ROS Negative, with the exception of above mentioned in HPI   Objective:  BP 122/72   Pulse 76   Temp 98.3 F (36.8 C)   Wt 173 lb 9.6 oz (78.7 kg)   LMP 01/31/2007   SpO2 96%   BMI 29.80 kg/m  Body mass index is 29.8 kg/m. Physical Exam Vitals and nursing note reviewed.  Constitutional:      General: She is not in acute distress.    Appearance: Normal appearance. She is normal weight. She is not ill-appearing or toxic-appearing.     Comments: Looks very well today.  Smiling and appears happy  HENT:     Head: Normocephalic and atraumatic.  Eyes:     General: No scleral icterus.       Right eye: No discharge.  Left eye: No discharge.     Extraocular Movements: Extraocular movements intact.     Conjunctiva/sclera: Conjunctivae normal.     Pupils: Pupils are equal, round, and reactive to light.  Skin:    Findings: No rash.  Neurological:     Mental Status: She is alert and oriented to person, place, and time. Mental status is at baseline.     Motor: No weakness.     Coordination: Coordination normal.     Gait: Gait normal.  Psychiatric:        Mood and Affect: Mood normal.        Behavior: Behavior normal.        Thought Content: Thought content normal.        Judgment: Judgment normal.     No results found. No results found. No results found for this or any previous visit (from the past 24 hour(s)).  Assessment/Plan: Tiffany Horn is a 62 y.o. female present for OV for  Major depressive disorder, recurrent episode, mild (HCC)/anxiety/moodiness Doing very well. Continue Pristiq 50 mg daily Can continue Klonopin as it is for now, hopefully will be to discontinue future  B 12 deficiency: B12 injection provided today   Reviewed expectations re: course of current medical issues. Discussed self-management of symptoms. Outlined signs and symptoms indicating need for more acute intervention. Patient verbalized understanding and all questions were answered. Patient received an After-Visit Summary.  No follow-ups on file.   No orders of the defined types were placed in this encounter.  Meds ordered this encounter  Medications   desvenlafaxine (PRISTIQ) 50 MG 24 hr tablet    Sig: Take 1 tablet (50 mg total) by mouth daily.    Dispense:  90 tablet    Refill:  1   cyanocobalamin (VITAMIN B12) injection 1,000 mcg   Referral Orders  No referral(s) requested today     Note is dictated utilizing voice recognition software. Although note has been proof read prior to signing, occasional typographical errors still can be missed. If any questions arise, please do not hesitate to call for verification.   electronically signed by:  Felix Pacini, DO  [facility] - OR

## 2022-11-13 ENCOUNTER — Ambulatory Visit: Payer: BC Managed Care – PPO

## 2022-11-16 ENCOUNTER — Institutional Professional Consult (permissible substitution): Payer: BC Managed Care – PPO | Admitting: Neurology

## 2022-11-21 ENCOUNTER — Other Ambulatory Visit: Payer: Self-pay | Admitting: Neurosurgery

## 2022-11-21 ENCOUNTER — Encounter: Payer: Self-pay | Admitting: Neurosurgery

## 2022-11-21 DIAGNOSIS — I671 Cerebral aneurysm, nonruptured: Secondary | ICD-10-CM

## 2022-11-27 ENCOUNTER — Emergency Department (HOSPITAL_BASED_OUTPATIENT_CLINIC_OR_DEPARTMENT_OTHER): Payer: BC Managed Care – PPO

## 2022-11-27 ENCOUNTER — Emergency Department (HOSPITAL_BASED_OUTPATIENT_CLINIC_OR_DEPARTMENT_OTHER)
Admission: EM | Admit: 2022-11-27 | Discharge: 2022-11-27 | Disposition: A | Discharge status: Home / Self Care | Payer: BC Managed Care – PPO | Attending: Emergency Medicine | Admitting: Emergency Medicine

## 2022-11-27 ENCOUNTER — Other Ambulatory Visit: Payer: Self-pay

## 2022-11-27 ENCOUNTER — Other Ambulatory Visit: Payer: Self-pay | Admitting: Neurosurgery

## 2022-11-27 ENCOUNTER — Encounter (HOSPITAL_BASED_OUTPATIENT_CLINIC_OR_DEPARTMENT_OTHER): Payer: Self-pay

## 2022-11-27 ENCOUNTER — Telehealth: Payer: Self-pay | Admitting: Neurology

## 2022-11-27 DIAGNOSIS — R519 Headache, unspecified: Secondary | ICD-10-CM | POA: Diagnosis present

## 2022-11-27 DIAGNOSIS — Z7951 Long term (current) use of inhaled steroids: Secondary | ICD-10-CM | POA: Insufficient documentation

## 2022-11-27 DIAGNOSIS — R791 Abnormal coagulation profile: Secondary | ICD-10-CM | POA: Diagnosis not present

## 2022-11-27 DIAGNOSIS — E039 Hypothyroidism, unspecified: Secondary | ICD-10-CM | POA: Insufficient documentation

## 2022-11-27 DIAGNOSIS — Z8616 Personal history of COVID-19: Secondary | ICD-10-CM | POA: Insufficient documentation

## 2022-11-27 DIAGNOSIS — J45909 Unspecified asthma, uncomplicated: Secondary | ICD-10-CM | POA: Diagnosis not present

## 2022-11-27 DIAGNOSIS — I10 Essential (primary) hypertension: Secondary | ICD-10-CM | POA: Insufficient documentation

## 2022-11-27 DIAGNOSIS — Z7982 Long term (current) use of aspirin: Secondary | ICD-10-CM | POA: Insufficient documentation

## 2022-11-27 DIAGNOSIS — R1110 Vomiting, unspecified: Secondary | ICD-10-CM | POA: Insufficient documentation

## 2022-11-27 DIAGNOSIS — Z79899 Other long term (current) drug therapy: Secondary | ICD-10-CM | POA: Insufficient documentation

## 2022-11-27 LAB — BASIC METABOLIC PANEL
Anion gap: 12 (ref 5–15)
BUN: 12 mg/dL (ref 6–20)
CO2: 24 mmol/L (ref 22–32)
Calcium: 9 mg/dL (ref 8.9–10.3)
Chloride: 103 mmol/L (ref 98–111)
Creatinine, Ser: 0.8 mg/dL (ref 0.44–1.00)
GFR, Estimated: >60 mL/min (ref >60)
Glucose, Bld: 175 mg/dL — ABNORMAL HIGH (ref 70–99)
Potassium: 3.3 mmol/L — ABNORMAL LOW (ref 3.5–5.1)
Sodium: 139 mmol/L (ref 135–145)

## 2022-11-27 LAB — CBG MONITORING, ED: Glucose-Capillary: 108 mg/dL — ABNORMAL HIGH (ref 70–99)

## 2022-11-27 LAB — CBC WITH DIFFERENTIAL/PLATELET
Abs Immature Granulocytes: 0.01 10*3/uL (ref 0.00–0.07)
Basophils Absolute: 0 10*3/uL (ref 0.0–0.1)
Basophils Relative: 1 %
Eosinophils Absolute: 0.2 10*3/uL (ref 0.0–0.5)
Eosinophils Relative: 5 %
HCT: 41.5 % (ref 36.0–46.0)
Hemoglobin: 13.8 g/dL (ref 12.0–15.0)
Immature Granulocytes: 0 %
Lymphocytes Relative: 37 %
Lymphs Abs: 1.5 10*3/uL (ref 0.7–4.0)
MCH: 29.9 pg (ref 26.0–34.0)
MCHC: 33.3 g/dL (ref 30.0–36.0)
MCV: 89.8 fL (ref 80.0–100.0)
Monocytes Absolute: 0.5 10*3/uL (ref 0.1–1.0)
Monocytes Relative: 12 %
Neutro Abs: 1.9 10*3/uL (ref 1.7–7.7)
Neutrophils Relative %: 45 %
Platelets: 218 10*3/uL (ref 150–400)
RBC: 4.62 MIL/uL (ref 3.87–5.11)
RDW: 12.5 % (ref 11.5–15.5)
WBC: 4.2 10*3/uL (ref 4.0–10.5)
nRBC: 0 % (ref 0.0–0.2)

## 2022-11-27 LAB — PROTIME-INR
INR: 1 (ref 0.8–1.2)
Prothrombin Time: 13 s (ref 11.4–15.2)

## 2022-11-27 LAB — APTT: aPTT: 24 s (ref 24–36)

## 2022-11-27 MED ORDER — IOHEXOL 350 MG/ML SOLN
75.0000 mL | Freq: Once | INTRAVENOUS | Status: AC | PRN
  Administered 2022-11-27: 75 mL via INTRAVENOUS

## 2022-11-27 MED ORDER — ONDANSETRON 4 MG PO TBDP: 4.0000 mg | ORAL_TABLET | Freq: Three times a day (TID) | ORAL | 0 refills | Status: DC | PRN

## 2022-11-27 MED ORDER — DIPHENHYDRAMINE HCL 50 MG/ML IJ SOLN
25.0000 mg | Freq: Once | INTRAMUSCULAR | Status: AC
  Administered 2022-11-27: 25 mg via INTRAVENOUS
  Filled 2022-11-27: qty 1

## 2022-11-27 MED ORDER — ACETAMINOPHEN 500 MG PO TABS
1000.0000 mg | ORAL_TABLET | Freq: Once | ORAL | Status: AC
  Administered 2022-11-27: 1000 mg via ORAL
  Filled 2022-11-27: qty 2

## 2022-11-27 MED ORDER — METOCLOPRAMIDE HCL 5 MG/ML IJ SOLN
10.0000 mg | Freq: Once | INTRAMUSCULAR | Status: AC
  Administered 2022-11-27: 10 mg via INTRAVENOUS
  Filled 2022-11-27: qty 2

## 2022-11-27 NOTE — ED Notes (Signed)
Patient transported to CT on cardiac monitor, accompanied by this RN

## 2022-11-27 NOTE — Discharge Instructions (Addendum)
Thank you for coming to [facility] Emergency Department. You were seen for severe headache. We did an exam, labs, and imaging, and these showed no acute findings. Please take tylenol 1,000 mg every 8 hours as needed for pain. We have prescribed zofran 4 mg under the tongue as needed every 6-8 hours for nausea/vomiting.  Please follow up with your primary care provider within 1 week. Please call your neurosurgeon's office and let them know about your visit today.   Do not hesitate to return to the ED or call 911 if you experience: -Worsening symptoms -Slurred speech, facial droop, confusion, numbness/tingling, asymmetric weakness -Thunderclap headache -Visual changes -Lightheadedness, passing out -Fevers/chills -Anything else that concerns you

## 2022-11-27 NOTE — Telephone Encounter (Signed)
Called pt and relayed this information. Pt verbalized understanding and was very appreciative for the call back.

## 2022-11-27 NOTE — ED Provider Notes (Signed)
[place] [facility] Provider Note   CSN: [id number] Arrival date & time: 11/27/22  1721     History  Chief Complaint  Patient presents with   Headache    Tiffany Horn is a 63 y.o. female with PMH as listed below who presents with severe 9/10 headache since Thursday. Not like her normal headaches. Had two episodes of vomiting today with pain located on Right side of her head and went to UC who recommended she come to ED. No f/c, neck pain/stiffness, double vision, facial droop, N/T, asymmetric weakness, slurred speech, confusion, head trauma/falls. Did not get worse with bearing down, bending over, or lying down. as well as blurred vision. She has a known brain aneurysm and has a scheduled upcoming brain surgery on Nov 22. She reports this headache is the worst she has had    Past Medical History:  Diagnosis Date   Allergy    Anxiety    Arthritis    knees and spine, shoulder   Asthma    Bronchitis    hx - recurrent   Complication of anesthesia    waking up is not easy   Depression    Elevated IgE level 09/12/2017   09/12/2017 IgE 195   GERD (gastroesophageal reflux disease)    History of COVID-19 12/30/2020   Hx of irritable bowel syndrome    x2   Hyperlipidemia    diet controlled - no medication   Hypertension    not taking any meds at present - under control per patient   Hypokalemia    with PNA admission (2.5)   Hypothyroidism (acquired)    Ischemic cerebrovascular accident (CVA) (HCC)    Migraines    Osteoporosis    Pneumonia    4 episodes; hosp. admission 2014   PONV (postoperative nausea and vomiting)    TIA (transient ischemic attack) 12/17/2020   UC (ulcerative colitis) [facility])    DX'D 2021       Home Medications Prior to Admission medications   Medication Sig Start Date End Date Taking? Authorizing Provider  ondansetron (ZOFRAN-ODT) 4 MG disintegrating tablet Take 1 tablet (4 mg total) by mouth every 8 (eight)  hours as needed for nausea or vomiting. 11/27/22  Yes Loetta Rough, MD  amLODipine (NORVASC) 10 MG tablet Take 1 tablet (10 mg total) by mouth daily. 10/13/22   Kuneff, Renee A, DO  aspirin EC 81 MG tablet Take 81 mg by mouth daily. Swallow whole.    [provider]  atorvastatin (LIPITOR) 80 MG tablet Take 1 tablet (80 mg total) by mouth daily. 10/13/22   Kuneff, Renee A, DO  Budeson-Glycopyrrol-Formoterol (BREZTRI AEROSPHERE) 160-9-4.8 MCG/ACT AERO Inhale 2 puffs into the lungs in the morning and at bedtime. 04/28/20   Josephine Igo, DO  Cholecalciferol (VITAMIN D-3 PO) Take 1 capsule by mouth daily.    [provider]  clonazePAM (KLONOPIN) 0.5 MG tablet Take 1 tablet (0.5 mg total) by mouth 2 (two) times daily as needed for anxiety. 09/05/22   Kuneff, Renee A, DO  clopidogrel (PLAVIX) 75 MG tablet Take 1 tablet (75 mg total) by mouth daily. 10/13/22   Kuneff, Renee A, DO  clotrimazole-betamethasone (LOTRISONE) cream Apply 1 Application topically daily. 10/25/22   McGowen, Maryjean Morn, MD  Cyanocobalamin (B-12) 1000 MCG SUBL Place 1,000 mcg under the tongue daily. 10/16/22   Kuneff, Renee A, DO  desvenlafaxine (PRISTIQ) 50 MG 24 hr tablet Take 1 tablet (50 mg  total) by mouth daily. 11/03/22   Kuneff, Renee A, DO  ezetimibe (ZETIA) 10 MG tablet Take 1 tablet (10 mg total) by mouth every evening. 10/13/22   Kuneff, Renee A, DO  folic acid (FOLVITE) 1 MG tablet Take 1 tablet (1 mg total) by mouth daily. 10/16/22   Kuneff, Renee A, DO  levothyroxine (SYNTHROID) 150 MCG tablet Take 1 tablet (150 mcg total) by mouth daily before breakfast. 10/13/22   Kuneff, Renee A, DO  Multiple Vitamins-Minerals (ZINC PO) Take by mouth daily.    [provider]  pantoprazole (PROTONIX) 40 MG tablet Take 1 tablet (40 mg total) by mouth daily. 07/21/22   Napoleon Form, MD  Vitamin D, Ergocalciferol, (DRISDOL) 1.25 MG (50000 UNIT) CAPS capsule Take 1 capsule (50,000 Units total) by mouth every 14  (fourteen) days. 07/06/22   Kuneff, Renee A, DO      Allergies    Abilify [aripiprazole], Amoxil [amoxicillin], Promethazine hcl, and Seroquel [quetiapine fumarate]    Review of Systems   Review of Systems A 10 point review of systems was performed and is negative unless otherwise reported in HPI.  Physical Exam Updated Vital Signs BP 115/78   Pulse 94   Temp 97.8 F (36.6 C)   Resp 19   Ht 5' 6.5" (1.689 m)   Wt 77.1 kg   LMP 01/31/2007   SpO2 99%   BMI 27.03 kg/m  Physical Exam General: Normal appearing female, lying in bed.  HEENT: PERRLA, EOMI, no nystagmus, Sclera anicteric, MMM, trachea midline. Tongue protrudes midline. Neck supple.  Cardiology: RRR, no murmurs/rubs/gallops. BL radial and DP pulses equal bilaterally.  Resp: Normal respiratory rate and effort. CTAB, no wheezes, rhonchi, crackles.  Abd: Soft, non-tender, non-distended. No rebound tenderness or guarding.  GU: Deferred. MSK: No peripheral edema or signs of trauma. Extremities without deformity or TTP. No cyanosis or clubbing. Skin: warm, dry.  Neuro: A&Ox4, CNs II-XII grossly intact. 5/5 strength all extremities. Sensation grossly intact. Normal speech. No facial droop.  Psych: Normal mood and affect.   ED Results / Procedures / Treatments   Labs (all labs ordered are listed, but only abnormal results are displayed) Labs Reviewed  BASIC METABOLIC PANEL - Abnormal; Notable for the following components:      Result Value   Potassium 3.3 (*)    Glucose, Bld 175 (*)    All other components within normal limits  CBG MONITORING, ED - Abnormal; Notable for the following components:   Glucose-Capillary 108 (*)    All other components within normal limits  CBC WITH DIFFERENTIAL/PLATELET  PROTIME-INR  APTT    EKG EKG Interpretation Date/Time:  Monday November 27 2022 17:31:56 EDT Ventricular Rate:  83 PR Interval:  148 QRS Duration:  72 QT Interval:  368 QTC Calculation: 432 R Axis:   64  Text  Interpretation: Normal sinus rhythm Normal ECG When compared with ECG of 21-Aug-2022 13:07, PREVIOUS ECG IS PRESENT Confirmed by Vivi Barrack [phone]) on 11/27/2022 5:50:59 PM  Radiology CT ANGIO HEAD NECK W WO CM  Result Date: 11/27/2022 CLINICAL DATA:  Severe migraine since Thursday, history of aneurysm EXAM: CT ANGIOGRAPHY HEAD AND NECK WITH AND WITHOUT CONTRAST TECHNIQUE: Multidetector CT imaging of the head and neck was performed using the standard protocol during bolus administration of intravenous contrast. Multiplanar CT image reconstructions and MIPs were obtained to evaluate the vascular anatomy. Carotid stenosis measurements (when applicable) are obtained utilizing NASCET criteria, using the distal internal carotid diameter as the denominator.  RADIATION DOSE REDUCTION: This exam was performed according to the departmental dose-optimization program which includes automated exposure control, adjustment of the mA and/or kV according to patient size and/or use of iterative reconstruction technique. CONTRAST:  75mL OMNIPAQUE IOHEXOL 350 MG/ML SOLN COMPARISON:  08/21/2022 CTA head neck FINDINGS: CT HEAD FINDINGS Brain: No evidence of acute infarct, hemorrhage, mass, mass effect, or midline shift. No hydrocephalus or extra-axial fluid collection. Vascular: No hyperdense vessel. Redemonstrated coil pack in the basilar tip. Skull: Negative for fracture or focal lesion. Sinuses/Orbits: No acute finding. Other: The mastoid air cells are well aerated. CTA NECK FINDINGS Aortic arch: Aberrant origin of the right subclavian artery. Otherwise unremarkable aortic arch. Imaged portion shows no evidence of aneurysm or dissection. No significant stenosis of the major arch vessel origins. Right carotid system: No evidence of dissection, occlusion, or hemodynamically significant stenosis (greater than 50%). Retropharyngeal course of the proximal right ICA. Left carotid system: No evidence of dissection, occlusion, or  hemodynamically significant stenosis (greater than 50%). Vertebral arteries: No evidence of dissection, occlusion, or hemodynamically significant stenosis (greater than 50%). Skeleton: No acute osseous abnormality. Degenerative changes in the cervical spine. Other neck: No acute finding. Upper chest: No focal pulmonary opacity or pleural effusion. Review of the MIP images confirms the above findings CTA HEAD FINDINGS Evaluation is somewhat limited by beam hardening artifact from the basilar tip coil pack. Anterior circulation: Both internal carotid arteries are patent to the termini, without significant stenosis. A1 segments patent. Normal anterior communicating artery. Anterior cerebral arteries are patent to their distal aspects without significant stenosis. No M1 stenosis or occlusion. MCA branches perfused to their distal aspects without significant stenosis. Posterior circulation: Vertebral arteries patent to the vertebrobasilar junction without significant stenosis. Posterior inferior cerebellar arteries patent proximally. Basilar patent to its distal aspect without significant stenosis. Redemonstrated stent in the distal basilar artery with basilar tip coil embolization. Beam hardening artifact from the coil pack limits evaluation of any residual aneurysm sac as well as the P1 segments. The stent appears to terminate in the proximal right P2 and appears patent. Redemonstrated stenosis in the proximal right P 2, just distal to the stent (series 605, image 103). The imaged PCAs are otherwise perfused to their distal aspects without significant stenosis. The bilateral posterior communicating arteries are not visualized. Venous sinuses: As permitted by contrast timing, patent. Anatomic variants: None significant. No evidence of new aneurysm or vascular malformation. Review of the MIP images confirms the above findings IMPRESSION: 1. No acute intracranial process. 2. No intracranial large vessel occlusion. 3.  Redemonstrated stent in the distal basilar artery with basilar tip aneurysm coil embolization. Beam hardening artifact from the coil pack limits evaluation of any residual aneurysm sac as well as the P1 segments. The stent appears patent, with redemonstrated stenosis in the proximal right P2, just distal to the stent. 4. No new aneurysm or vascular malformation. 5. No hemodynamically significant stenosis in the neck. Electronically Signed   By: Wiliam Ke M.D.   On: 11/27/2022 18:28    Procedures Procedures    Medications Ordered in ED Medications  iohexol (OMNIPAQUE) 350 MG/ML injection 75 mL (75 mLs Intravenous Contrast Given 11/27/22 1801)  acetaminophen (TYLENOL) tablet 1,000 mg (1,000 mg Oral Given 11/27/22 1842)  diphenhydrAMINE (BENADRYL) injection 25 mg (25 mg Intravenous Given 11/27/22 1842)  metoCLOPramide (REGLAN) injection 10 mg (10 mg Intravenous Given 11/27/22 1842)    ED Course/ Medical Decision Making/ A&P  Medical Decision Making Amount and/or Complexity of Data Reviewed Labs: ordered. Decision-making details documented in ED Course. Radiology: ordered. Decision-making details documented in ED Course.  Risk OTC drugs. Prescription drug management.    This patient presents to the ED for concern of severe headache with history of brain aneurysm, this involves an extensive number of treatment options, and is a complaint that carries with it a high risk of complications and morbidity.  I considered the following differential and admission for this acute, potentially life threatening condition.   MDM:    Given worst headache of life w/ h/o known brain aneurysm consider possible SAH and will get CTA, as headache has been going on for several days. Reassuringly not associated w/ nuchal rigidity or FNDs, though patient does report some binocular blurry vision. Also consider atypical migraine, tension headache. No f/c or viral sxs to raise c/f  sinusitis. Overall patient is overall uncomfortable but well-appearing, HDS, non-toxic, afebrile.  No history of trauma or falls to indicate an SDH or EDH.  Unlikely meningitis as she is afebrile with no meningismus.  Unlikely temporal arteritis.  Unlikely IIH without any positional worsening.  Unlikely acute angle-closure glaucoma or CO poisoning given no household members with headaches either.  Ultimate imaging negative for headache cocktail and reevaluate patient.   Clinical Course as of 11/27/22 1947  Mon Nov 27, 2022  1750 CBC with Differential wnl [HN]  1814 BMP, PT INR, APTT unremarkable [HN]  1831 CT ANGIO HEAD NECK W WO CM 1. No acute intracranial process. 2. No intracranial large vessel occlusion. 3. Redemonstrated stent in the distal basilar artery with basilar tip aneurysm coil embolization. Beam hardening artifact from the coil pack limits evaluation of any residual aneurysm sac as well as the P1 segments. The stent appears patent, with redemonstrated stenosis in the proximal right P2, just distal to the stent. 4. No new aneurysm or vascular malformation. 5. No hemodynamically significant stenosis in the neck.   [HN]    Clinical Course User Index [HN] Loetta Rough, MD    Labs: I Ordered, and personally interpreted labs.  The pertinent results include:  those listed above  Imaging Studies ordered: I ordered imaging studies including CTA H&N I independently visualized and interpreted imaging. I agree with the radiologist interpretation  Additional history obtained from chart review.    Cardiac Monitoring: The patient was maintained on a cardiac monitor.  I personally viewed and interpreted the cardiac monitored which showed an underlying rhythm of: NSR  Reevaluation: After the interventions noted above, I reevaluated the patient and found that they have :improved  Social Determinants of Health: Lives independently  Disposition: Patient feels her headache is  much improved after the headache cocktail, now rated 2 out of 10.  Her imaging is very reassuring and unchanged.  Patient is reassured by this and states that she is ready discharge.  I feel that life-threatening or emergent etiology of patient's headache today and she can follow-up outpatient.  Instructed to take Tylenol at home for pain control.   DC w/ discharge instructions/return precautions. All questions answered to patient's satisfaction.    Co morbidities that complicate the patient evaluation  Past Medical History:  Diagnosis Date   Allergy    Anxiety    Arthritis    knees and spine, shoulder   Asthma    Bronchitis    hx - recurrent   Complication of anesthesia    waking up is not easy   Depression  Elevated IgE level 09/12/2017   09/12/2017 IgE 195   GERD (gastroesophageal reflux disease)    History of COVID-19 12/30/2020   Hx of irritable bowel syndrome    x2   Hyperlipidemia    diet controlled - no medication   Hypertension    not taking any meds at present - under control per patient   Hypokalemia    with PNA admission (2.5)   Hypothyroidism (acquired)    Ischemic cerebrovascular accident (CVA) (HCC)    Migraines    Osteoporosis    Pneumonia    4 episodes; hosp. admission 2014   PONV (postoperative nausea and vomiting)    TIA (transient ischemic attack) 12/17/2020   UC (ulcerative colitis) [facility])    DX'D 2021     Medicines Meds ordered this encounter  Medications   iohexol (OMNIPAQUE) 350 MG/ML injection 75 mL   acetaminophen (TYLENOL) tablet 1,000 mg   diphenhydrAMINE (BENADRYL) injection 25 mg   metoCLOPramide (REGLAN) injection 10 mg   ondansetron (ZOFRAN-ODT) 4 MG disintegrating tablet    Sig: Take 1 tablet (4 mg total) by mouth every 8 (eight) hours as needed for nausea or vomiting.    Dispense:  20 tablet    Refill:  0    I have reviewed the patients home medicines and have made adjustments as needed  Problem List / ED Course: Problem List  Items Addressed This Visit   None Visit Diagnoses     Bad headache    -  Primary   Relevant Medications   acetaminophen (TYLENOL) tablet 1,000 mg (Completed)                   This note was created using dictation software, which may contain spelling or grammatical errors.    Loetta Rough, MD 12/01/22 0005

## 2022-11-27 NOTE — Telephone Encounter (Signed)
Pt called, to ask what can be prescribed for headaches. Going to have an angioplasty for aneurysm. Want to know what is safe to take and can he send in a prescription.  Informed patient Dr. Pearlean Brownie would need to see you before prescribing medication.

## 2022-11-27 NOTE — ED Notes (Signed)
This RN has messaged Dr. Jearld Fenton to obtain an order for a head CT.

## 2022-11-27 NOTE — ED Triage Notes (Signed)
Pt reports severe migraine since Thursday, states it feels like a hammer is hitting her head; associated with emesis x2 in the last hour as well as blurred vision. She has a known brain aneurysm and has a scheduled upcoming brain surgery on Nov 22. She reports this headache is the worst she has had.

## 2022-12-15 ENCOUNTER — Telehealth: Payer: Self-pay | Admitting: *Deleted

## 2022-12-15 ENCOUNTER — Encounter: Payer: Self-pay | Admitting: Gastroenterology

## 2022-12-15 ENCOUNTER — Other Ambulatory Visit (INDEPENDENT_AMBULATORY_CARE_PROVIDER_SITE_OTHER): Payer: BC Managed Care – PPO

## 2022-12-15 ENCOUNTER — Ambulatory Visit: Payer: BC Managed Care – PPO | Admitting: Gastroenterology

## 2022-12-15 VITALS — BP 118/80 | HR 91 | Ht 66.0 in | Wt 171.0 lb

## 2022-12-15 DIAGNOSIS — E538 Deficiency of other specified B group vitamins: Secondary | ICD-10-CM

## 2022-12-15 DIAGNOSIS — K580 Irritable bowel syndrome with diarrhea: Secondary | ICD-10-CM | POA: Diagnosis not present

## 2022-12-15 DIAGNOSIS — K920 Hematemesis: Secondary | ICD-10-CM | POA: Diagnosis not present

## 2022-12-15 DIAGNOSIS — R197 Diarrhea, unspecified: Secondary | ICD-10-CM

## 2022-12-15 DIAGNOSIS — G8929 Other chronic pain: Secondary | ICD-10-CM

## 2022-12-15 DIAGNOSIS — R634 Abnormal weight loss: Secondary | ICD-10-CM

## 2022-12-15 DIAGNOSIS — K219 Gastro-esophageal reflux disease without esophagitis: Secondary | ICD-10-CM | POA: Diagnosis not present

## 2022-12-15 DIAGNOSIS — R1013 Epigastric pain: Secondary | ICD-10-CM

## 2022-12-15 DIAGNOSIS — E878 Other disorders of electrolyte and fluid balance, not elsewhere classified: Secondary | ICD-10-CM

## 2022-12-15 LAB — CBC WITH DIFFERENTIAL/PLATELET
Basophils Absolute: 0.1 10*3/uL (ref 0.0–0.1)
Basophils Relative: 1.2 % (ref 0.0–3.0)
Eosinophils Absolute: 0.2 10*3/uL (ref 0.0–0.7)
Eosinophils Relative: 3.6 % (ref 0.0–5.0)
HCT: 44 % (ref 36.0–46.0)
Hemoglobin: 14.6 g/dL (ref 12.0–15.0)
Lymphocytes Relative: 33.1 % (ref 12.0–46.0)
Lymphs Abs: 1.6 10*3/uL (ref 0.7–4.0)
MCHC: 33.2 g/dL (ref 30.0–36.0)
MCV: 90.5 fL (ref 78.0–100.0)
Monocytes Absolute: 0.4 10*3/uL (ref 0.1–1.0)
Monocytes Relative: 8.6 % (ref 3.0–12.0)
Neutro Abs: 2.6 10*3/uL (ref 1.4–7.7)
Neutrophils Relative %: 53.5 % (ref 43.0–77.0)
Platelets: 216 10*3/uL (ref 150.0–400.0)
RBC: 4.86 Mil/uL (ref 3.87–5.11)
RDW: 12.8 % (ref 11.5–15.5)
WBC: 4.9 10*3/uL (ref 4.0–10.5)

## 2022-12-15 LAB — COMPREHENSIVE METABOLIC PANEL
ALT: 12 U/L (ref 0–35)
AST: 15 U/L (ref 0–37)
Albumin: 4.5 g/dL (ref 3.5–5.2)
Alkaline Phosphatase: 73 U/L (ref 39–117)
BUN: 10 mg/dL (ref 6–23)
CO2: 29 meq/L (ref 19–32)
Calcium: 9.4 mg/dL (ref 8.4–10.5)
Chloride: 105 meq/L (ref 96–112)
Creatinine, Ser: 0.75 mg/dL (ref 0.40–1.20)
GFR: 86.49 mL/min (ref >60.00)
Glucose, Bld: 88 mg/dL (ref 70–99)
Potassium: 4.2 meq/L (ref 3.5–5.1)
Sodium: 141 meq/L (ref 135–145)
Total Bilirubin: 0.6 mg/dL (ref 0.2–1.2)
Total Protein: 7.3 g/dL (ref 6.0–8.3)

## 2022-12-15 NOTE — Patient Instructions (Addendum)
VISIT SUMMARY:  During today's visit, we discussed your ongoing gastrointestinal issues, including chronic diarrhea and abdominal cramping, as well as your recent weight loss and lack of appetite. We also reviewed your history of aneurysm treatment and current medications. A plan was made to address these concerns and to conduct further tests to identify the underlying causes.  YOUR PLAN:  -CHRONIC DIARRHEA: Chronic diarrhea is a condition characterized by frequent, loose, or semi-formed bowel movements lasting for an extended period. We will conduct a stool test to check for infection and inflammation, and a blood test to check for gluten allergy. Continue taking Pantoprazole as prescribed, and use Bentyl for cramping as needed. An upper endoscopy is scheduled for 01/11/2023, pending clearance from your neurosurgeon.  -ELECTROLYTE IMBALANCE: Electrolyte imbalance occurs when the levels of electrolytes in your body are either too high or too low. We will recheck your electrolyte levels with a blood test to ensure they are within the normal range.  -VITAMIN B12 AND FOLIC ACID DEFICIENCY: Vitamin B12 and folic acid deficiency can lead to various health issues, including anemia and neurological problems. You are currently receiving B12 shots and taking folic acid pills, which are helping to manage this condition. No further tests are needed at this time.  -UPCOMING BRAIN SURGERY: You are scheduled for a follow-up procedure related to your previous aneurysm treatment. We will obtain clearance from your neurosurgeon for the upper endoscopy and continue your current medication, Plavix, as directed by your neurosurgeon.  INSTRUCTIONS:  Please follow up with the stool and blood tests as ordered. Continue taking your medications as prescribed. Use Bentyl for cramping as needed. Ensure you get clearance from your neurosurgeon for the upper endoscopy scheduled for 01/11/2023. If you experience any new or  worsening symptoms, please contact our office immediately.  Your provider has requested that you go to the basement level for lab work before leaving today. Press "B" on the elevator. The lab is located at the first door on the left as you exit the elevator.  You will be contacted by our office prior to your procedure for directions on holding your Plavix.  If you do not hear from our office 1 week prior to your scheduled procedure, please call [phone] to discuss.   Due to recent changes in healthcare laws, you may see the results of your imaging and laboratory studies on MyChart before your provider has had a chance to review them.  We understand that in some cases there may be results that are confusing or concerning to you. Not all laboratory results come back in the same time frame and the provider may be waiting for multiple results in order to interpret others.  Please give [place] 48 hours in order for your provider to thoroughly review all the results before contacting the office for clarification of your results.    I appreciate the  opportunity to care for you  Thank You   Marsa Aris , MD

## 2022-12-15 NOTE — Telephone Encounter (Signed)
 [facility] Pre-operative Risk Assessment     Request for surgical clearance:     Endoscopy Procedure  What type of surgery is being performed?    Endoscopy   When is this surgery scheduled?     01/11/2023  What type of clearance is required ?   Pharmacy  Are there any medications that need to be held prior to surgery and how long? 5 days  PLAVIX  Practice name and name of physician performing surgery?      [facility]  What is your office phone and fax number?      Phone- [phone]  Fax- [phone]  Anesthesia type (None, local, MAC, general) ?       MAC

## 2022-12-15 NOTE — Progress Notes (Signed)
Tiffany Horn    [id number]    [birth year]  Primary Care Physician:Kuneff, Ezequiel Essex, DO  Referring Physician: Natalia Leatherwood, DO [address] [place],  [place] [address]   Chief complaint:  Epigastric pain, diarrhea  Discussed the use of AI scribe software for clinical note transcription with the patient, who gave verbal consent to proceed.  History of Present Illness   The patient, with a history of gastrointestinal issues, presents with a six-week history of diarrhea. She reports that all food consumed is rapidly expelled within 30 minutes, regardless of the type of food. The patient denies any changes in diet, specifically noting the avoidance of red meat, spicy food, and fried food. The frequency of bowel movements has increased to eight to ten times daily, even waking the patient at night. The patient describes the stool as semi-formed, neither solid nor watery, and denies the presence of blood or mucus.  The patient also reports a recent episode of severe abdominal cramping that caused her to double over in pain. She expresses concern about the possibility of an ulcer due to recent high stress and anxiety levels. The patient also notes a recent end to a stressful relationship.  The patient has lost approximately 15 pounds unintentionally over an unspecified period. She also reports a lack of appetite, to the point of not eating for an entire day. The patient has stopped drinking coffee due to gastrointestinal discomfort.  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 She reports worsening vision, balance issues, and frequent headaches since the procedure. She is scheduled for a recheck of the aneurysm due to concerns about coil compaction. The patient is currently on Plavix, prescribed by her neurosurgeon.  The patient also has a history of low B12 and folic acid levels, for which she is receiving B12 shots and  taking folic acid pills. She reports that the B12 shots have been helpful.          Outpatient Encounter Medications as of 12/15/2022  Medication Sig   amLODipine (NORVASC) 10 MG tablet Take 1 tablet (10 mg total) by mouth daily.   aspirin EC 81 MG tablet Take 81 mg by mouth daily. Swallow whole.   atorvastatin (LIPITOR) 80 MG tablet Take 1 tablet (80 mg total) by mouth daily.   Budeson-Glycopyrrol-Formoterol (BREZTRI AEROSPHERE) 160-9-4.8 MCG/ACT AERO Inhale 2 puffs into the lungs in the morning and at bedtime.   Cholecalciferol (VITAMIN D-3 PO) Take 1 capsule by mouth daily.   clonazePAM (KLONOPIN) 0.5 MG tablet Take 1 tablet (0.5 mg total) by mouth 2 (two) times daily as needed for anxiety.   clopidogrel (PLAVIX) 75 MG tablet Take 1 tablet (75 mg total) by mouth daily.   Cyanocobalamin (B-12) 1000 MCG SUBL Place 1,000 mcg under the tongue daily.   desvenlafaxine (PRISTIQ) 50 MG 24 hr tablet Take 1 tablet (50 mg total) by mouth daily.   ezetimibe (ZETIA) 10 MG tablet Take 1 tablet (10 mg total) by mouth every evening.   folic acid (FOLVITE) 1 MG tablet Take 1 tablet (1 mg total) by mouth daily.   levothyroxine (SYNTHROID) 150 MCG tablet Take 1 tablet (150 mcg total) by mouth daily before breakfast.   Multiple Vitamins-Minerals (ZINC PO) Take by mouth daily.   ondansetron (ZOFRAN-ODT) 4 MG disintegrating tablet Take 1 tablet (4 mg total) by mouth every 8 (eight) hours as needed  for nausea or vomiting.   pantoprazole (PROTONIX) 40 MG tablet Take 1 tablet (40 mg total) by mouth daily.   Vitamin D, Ergocalciferol, (DRISDOL) 1.25 MG (50000 UNIT) CAPS capsule Take 1 capsule (50,000 Units total) by mouth every 14 (fourteen) days.   [DISCONTINUED] clotrimazole-betamethasone (LOTRISONE) cream Apply 1 Application topically daily.   No facility-administered encounter medications on file as of 12/15/2022.    Allergies as of 12/15/2022 - Review Complete 12/15/2022  Allergen Reaction Noted    Abilify [aripiprazole] Nausea And Vomiting 09/03/2019   Amoxil [amoxicillin] Diarrhea 06/11/2015   Promethazine hcl Other (See Comments) 12/29/2020   Seroquel [quetiapine fumarate] Other (See Comments) 09/29/2016    Past Medical History:  Diagnosis Date   Allergy    Anxiety    Arthritis    knees and spine, shoulder   Asthma    Bronchitis    hx - recurrent   Complication of anesthesia    waking up is not easy   Depression    Elevated IgE level 09/12/2017   09/12/2017 IgE 195   GERD (gastroesophageal reflux disease)    History of COVID-19 12/30/2020   Hx of irritable bowel syndrome    x2   Hyperlipidemia    diet controlled - no medication   Hypertension    not taking any meds at present - under control per patient   Hypokalemia    with PNA admission (2.5)   Hypothyroidism (acquired)    Ischemic cerebrovascular accident (CVA) (HCC)    Migraines    Osteoporosis    Pneumonia    4 episodes; hosp. admission 2014   PONV (postoperative nausea and vomiting)    TIA (transient ischemic attack) 12/17/2020   UC (ulcerative colitis) [facility])    DX'D 2021    Past Surgical History:  Procedure Laterality Date   ANEURYSM COILING     STENT   BREAST SURGERY     implants, then had them removed   COLONOSCOPY     greater 10 yrs ago - ? [facility]-2017 LAST   DILATION AND CURETTAGE OF UTERUS     IR ANGIO INTRA EXTRACRAN SEL INTERNAL CAROTID BILAT MOD SED  11/19/2020   IR ANGIO VERTEBRAL SEL VERTEBRAL BILAT MOD SED  11/19/2020   IR ANGIO VERTEBRAL SEL VERTEBRAL UNI L MOD SED  02/25/2021   IR ANGIO VERTEBRAL SEL VERTEBRAL UNI L MOD SED  01/13/2022   IR ANGIO VERTEBRAL SEL VERTEBRAL UNI L MOD SED  12/22/2022   IR ANGIOGRAM FOLLOW UP STUDY  11/19/2020   IR ANGIOGRAM FOLLOW UP STUDY  11/19/2020   IR ANGIOGRAM FOLLOW UP STUDY  11/19/2020   IR ANGIOGRAM FOLLOW UP STUDY  11/19/2020   IR ANGIOGRAM FOLLOW UP STUDY  11/19/2020   IR ANGIOGRAM FOLLOW UP STUDY  11/19/2020   IR ANGIOGRAM  FOLLOW UP STUDY  11/19/2020   IR ANGIOGRAM FOLLOW UP STUDY  11/19/2020   IR INTRA CRAN STENT  11/19/2020   IR TRANSCATH/EMBOLIZ  11/19/2020   LAPAROSCOPIC ABDOMINAL EXPLORATION  01/31/1992   endometriosis   ORIF HUMERUS FRACTURE Left 04/01/2013   DR Magnus Ivan - shoulder   ORIF HUMERUS FRACTURE Left 04/01/2013   Procedure: OPEN REDUCTION INTERNAL FIXATION (ORIF) LEFT PROXIMAL HUMERUS FRACTURE;  Surgeon: Kathryne Hitch, MD;  Location: [facility] OR;  Service: Orthopedics;  Laterality: Left;   RADIOLOGY WITH ANESTHESIA N/A 11/19/2020   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;  Surgeon: Lisbeth Renshaw, MD;  Location: [facility] OR;  Service: Radiology;  Laterality: N/A;  TONSILLECTOMY AND ADENOIDECTOMY      Family History  Problem Relation Age of Onset   Breast cancer Mother    Diabetes Mother    Heart disease Mother    Diabetes Father    Dementia Father    Colon cancer Father    Stroke Paternal Grandmother    Diabetes Paternal Grandmother    Colon polyps Neg Hx    Esophageal cancer Neg Hx    Stomach cancer Neg Hx    Rectal cancer Neg Hx    Crohn's disease Neg Hx    Ulcerative colitis Neg Hx     Social History   Socioeconomic History   Marital status: Single    Spouse name: Not on file   Number of children: 0   Years of education: Not on file   Highest education level: Bachelor's degree (e.g., BA, AB, BS)  Occupational History   Occupation: Geologist, engineering  Tobacco Use   Smoking status: Never    Passive exposure: Never   Smokeless tobacco: Never  Vaping Use   Vaping status: Never Used  Substance and Sexual Activity   Alcohol use: Yes    Alcohol/week: 1.0 standard drink of alcohol    Types: 1 Glasses of wine per week    Comment: SOCIALLY   Drug use: No   Sexual activity: Yes    Partners: Male    Birth control/protection: Post-menopausal  Other Topics Concern   Not on file  Social History Narrative   She is originally from [place]. She has traveled to  [place] [facility] [place], [place], [place], [place], [place], [place], [place], [place], [place]. No international travel. She has dogs. No prior bird, mold, or recent hot tub exposure. She hasn't used her hot tub in 1.5 years. 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 Previously enjoyed gardening and playing tennis. Helps to care for her mother.   Social Determinants of Health   Financial Resource Strain: Low Risk  (10/09/2022)   Overall Financial Resource Strain (CARDIA)    Difficulty of Paying Living Expenses: Not hard at all  Food Insecurity: No Food Insecurity (10/09/2022)   Hunger Vital Sign    Worried About Running Out of Food in the Last Year: Never true    Ran Out of Food in the Last Year: Never true  Transportation Needs: No Transportation Needs (10/09/2022)   PRAPARE - Administrator, Civil Service (Medical): No    Lack of Transportation (Non-Medical): No  Physical Activity: Insufficiently Active (10/09/2022)   Exercise Vital Sign    Days of Exercise per Week: 1 day    Minutes of Exercise per Session: 20 min  Stress: Stress Concern Present (10/09/2022)   [employer] of Occupational Health - Occupational Stress Questionnaire    Feeling of Stress : Very much  Social Connections: Moderately Isolated (10/09/2022)   Social Connection and Isolation Panel [NHANES]    Frequency of Communication with Friends and Family: More than three times a week    Frequency of Social Gatherings with Friends and Family: Twice a week    Attends Religious Services: 1 to 4 times per year    Active Member of [employer] or Organizations: No    Attends Banker Meetings: Not on file    Marital Status: Divorced  Intimate Partner Violence: Not At Risk (01/05/2022)   Humiliation, Afraid, Rape, and Kick questionnaire    Fear of Current or Ex-Partner: No  Emotionally Abused: No    Physically Abused: No    Sexually Abused: No      Review of systems: All other review of systems  negative except as mentioned in the HPI.   Physical Exam: Vitals:   12/15/22 1407  BP: 118/80  Pulse: 91   Body mass index is 27.6 kg/m. Gen:      No acute distress HEENT:  sclera anicteric Abd:      soft, non-tender; no palpable masses, no distension Ext:    No edema Neuro: alert and oriented x 3 Psych: normal mood and affect  Data Reviewed:  Reviewed labs, radiology imaging, old records and pertinent past GI work up  Results   LABS Potassium: mildly decreased (11/27/2022) Vitamin B12: significantly decreased (10/2022) Folic acid: significantly decreased (10/2022)       Assessment and Plan/Recommendations: 61yr F with epigastric pain, hematemesis, weight loss and diarrhea  New onset of frequent bowel movements (8-10 times/day) with semi-formed stools, associated with abdominal cramping. No blood or mucus in stool. Recent stressors and anxiety noted. -Order stool test for infection and inflammation. -Order blood test for gluten allergy. -Continue Pantoprazole. -Prescribe Bentyl for cramping as needed. -Schedule upper endoscopy for 01/11/2023 pending clearance from neurosurgeon. Will obtain biopsies to exclude celiac disease and peptic ulcer disease  Electrolyte Imbalance Previous low potassium level. -Order blood test to recheck electrolytes.  Vitamin B12 and Folic Acid Deficiency Patient on B12 shots and folic acid pills. -No need to recheck levels at this time.  Upcoming Brain Surgery Scheduled for follow-up procedure related to previous aneurysm treatment. -Obtain clearance from neurosurgeon for upper endoscopy. -Continue Plavix as directed by neurosurgeon.          This visit required 40 minutes of patient care (this includes precharting, chart review, review of results, face-to-face time used for counseling as well as treatment plan and follow-up. The patient was provided an opportunity to ask questions and all were answered. The patient agreed with the  plan and demonstrated an understanding of the instructions.  Iona Beard , MD    CC: Natalia Leatherwood, DO

## 2022-12-15 NOTE — Telephone Encounter (Signed)
   Patient Name: Tiffany Horn  DOB: [birth year] MRN: [id number]  Primary Cardiologist: Donato Schultz, MD  Chart reviewed as part of pre-operative protocol coverage.   Patient takes Plavix for history of TIA, brain aneurysm with coils.  She is not on Plavix for cardiac reasons.  Recommendations for holding Plavix prior to procedure should come from managing provider (PCP/neurology).  I will route this recommendation to the requesting party via Epic fax function and remove from pre-op pool.  Please call with questions.  Joylene Grapes, NP 12/15/2022, 3:32 PM

## 2022-12-16 LAB — IGA: Immunoglobulin A: 179 mg/dL (ref 47–310)

## 2022-12-16 LAB — TISSUE TRANSGLUTAMINASE, IGA: (tTG) Ab, IgA: <1 U/mL

## 2022-12-18 NOTE — Telephone Encounter (Signed)
Dr Delia Heady Can you please advise on the following blood thinner for patients procedure  Thank You

## 2022-12-21 NOTE — Telephone Encounter (Signed)
Ok thanks 

## 2022-12-21 NOTE — Telephone Encounter (Signed)
Pateint ok'd to hold Plavix 3-5 days prior to procedure called patient to inform to hold 5 days  FYI Dr Lavon Paganini

## 2022-12-22 ENCOUNTER — Other Ambulatory Visit (HOSPITAL_COMMUNITY): Payer: Self-pay | Admitting: Neurosurgery

## 2022-12-22 ENCOUNTER — Other Ambulatory Visit: Payer: Self-pay

## 2022-12-22 ENCOUNTER — Ambulatory Visit (HOSPITAL_COMMUNITY)
Admission: RE | Admit: 2022-12-22 | Discharge: 2022-12-22 | Disposition: A | Discharge status: Home / Self Care | Payer: BC Managed Care – PPO | Source: Ambulatory Visit | Attending: Neurosurgery

## 2022-12-22 DIAGNOSIS — R519 Headache, unspecified: Secondary | ICD-10-CM | POA: Diagnosis not present

## 2022-12-22 DIAGNOSIS — H538 Other visual disturbances: Secondary | ICD-10-CM | POA: Insufficient documentation

## 2022-12-22 DIAGNOSIS — I671 Cerebral aneurysm, nonruptured: Secondary | ICD-10-CM

## 2022-12-22 DIAGNOSIS — Z7902 Long term (current) use of antithrombotics/antiplatelets: Secondary | ICD-10-CM | POA: Insufficient documentation

## 2022-12-22 HISTORY — PX: IR ANGIO VERTEBRAL SEL VERTEBRAL UNI L MOD SED: IMG5367

## 2022-12-22 LAB — CBC WITH DIFFERENTIAL/PLATELET
Abs Immature Granulocytes: 0 10*3/uL (ref 0.00–0.07)
Basophils Absolute: 0.1 10*3/uL (ref 0.0–0.1)
Basophils Relative: 1 %
Eosinophils Absolute: 0.2 10*3/uL (ref 0.0–0.5)
Eosinophils Relative: 4 %
HCT: 40.6 % (ref 36.0–46.0)
Hemoglobin: 13.5 g/dL (ref 12.0–15.0)
Immature Granulocytes: 0 %
Lymphocytes Relative: 31 %
Lymphs Abs: 1.2 10*3/uL (ref 0.7–4.0)
MCH: 29.6 pg (ref 26.0–34.0)
MCHC: 33.3 g/dL (ref 30.0–36.0)
MCV: 89 fL (ref 80.0–100.0)
Monocytes Absolute: 0.5 10*3/uL (ref 0.1–1.0)
Monocytes Relative: 12 %
Neutro Abs: 2 10*3/uL (ref 1.7–7.7)
Neutrophils Relative %: 52 %
Platelets: 213 10*3/uL (ref 150–400)
RBC: 4.56 MIL/uL (ref 3.87–5.11)
RDW: 12.3 % (ref 11.5–15.5)
WBC: 3.9 10*3/uL — ABNORMAL LOW (ref 4.0–10.5)
nRBC: 0 % (ref 0.0–0.2)

## 2022-12-22 LAB — BASIC METABOLIC PANEL
Anion gap: 7 (ref 5–15)
BUN: 12 mg/dL (ref 6–20)
CO2: 25 mmol/L (ref 22–32)
Calcium: 9.1 mg/dL (ref 8.9–10.3)
Chloride: 109 mmol/L (ref 98–111)
Creatinine, Ser: 0.79 mg/dL (ref 0.44–1.00)
GFR, Estimated: >60 mL/min (ref >60)
Glucose, Bld: 106 mg/dL — ABNORMAL HIGH (ref 70–99)
Potassium: 3.6 mmol/L (ref 3.5–5.1)
Sodium: 141 mmol/L (ref 135–145)

## 2022-12-22 LAB — PROTIME-INR
INR: 1 (ref 0.8–1.2)
Prothrombin Time: 13 s (ref 11.4–15.2)

## 2022-12-22 LAB — APTT: aPTT: 25 s (ref 24–36)

## 2022-12-22 MED ORDER — LIDOCAINE-EPINEPHRINE 1 %-1:100000 IJ SOLN
INTRAMUSCULAR | Status: AC
  Filled 2022-12-22: qty 1

## 2022-12-22 MED ORDER — HYDROCODONE-ACETAMINOPHEN 5-325 MG PO TABS
1.0000 | ORAL_TABLET | ORAL | Status: DC | PRN
  Administered 2022-12-22: 2 via ORAL
  Filled 2022-12-22: qty 2

## 2022-12-22 MED ORDER — FENTANYL CITRATE (PF) 100 MCG/2ML IJ SOLN
INTRAMUSCULAR | Status: AC
  Filled 2022-12-22: qty 2

## 2022-12-22 MED ORDER — CHLORHEXIDINE GLUCONATE CLOTH 2 % EX PADS: 6.0000 | MEDICATED_PAD | Freq: Once | CUTANEOUS | Status: DC

## 2022-12-22 MED ORDER — HEPARIN SODIUM (PORCINE) 1000 UNIT/ML IJ SOLN
INTRAMUSCULAR | Status: AC | PRN
  Administered 2022-12-22: 2000 [IU] via INTRAVENOUS

## 2022-12-22 MED ORDER — LIDOCAINE HCL 1 % IJ SOLN
INTRAMUSCULAR | Status: AC
  Filled 2022-12-22: qty 20

## 2022-12-22 MED ORDER — MIDAZOLAM HCL 2 MG/2ML IJ SOLN
INTRAMUSCULAR | Status: AC | PRN
  Administered 2022-12-22: .5 mg via INTRAVENOUS
  Administered 2022-12-22: 1 mg via INTRAVENOUS
  Administered 2022-12-22: .5 mg via INTRAVENOUS

## 2022-12-22 MED ORDER — HEPARIN SODIUM (PORCINE) 1000 UNIT/ML IJ SOLN
INTRAMUSCULAR | Status: AC
  Filled 2022-12-22: qty 10

## 2022-12-22 MED ORDER — LIDOCAINE HCL 1 % IJ SOLN
20.0000 mL | Freq: Once | INTRAMUSCULAR | Status: AC
  Administered 2022-12-22: 6 mL via INTRADERMAL

## 2022-12-22 MED ORDER — MIDAZOLAM HCL 2 MG/2ML IJ SOLN
INTRAMUSCULAR | Status: AC
Start: 2022-12-22 — End: ?
  Filled 2022-12-22: qty 2

## 2022-12-22 MED ORDER — FENTANYL CITRATE (PF) 100 MCG/2ML IJ SOLN
INTRAMUSCULAR | Status: AC | PRN
  Administered 2022-12-22: 25 ug via INTRAVENOUS
  Administered 2022-12-22: 50 ug via INTRAVENOUS

## 2022-12-22 MED ORDER — IOHEXOL 300 MG/ML  SOLN
150.0000 mL | Freq: Once | INTRAMUSCULAR | Status: AC | PRN
  Administered 2022-12-22: 25 mL via INTRA_ARTERIAL

## 2022-12-22 NOTE — Sedation Documentation (Signed)
15 min pressure hold on right groin

## 2022-12-22 NOTE — Sedation Documentation (Addendum)
Angioseal failed

## 2022-12-22 NOTE — Sedation Documentation (Signed)
Angioseal 13F deployed

## 2022-12-22 NOTE — Sedation Documentation (Signed)
Clean dry intact. Guaze tegaderm

## 2022-12-22 NOTE — H&P (Signed)
Chief Complaint   Aneurysm  History of Present Illness  Tiffany Horn is a 62 y.o. female Who was initially seen more than 2 years ago with headache and blurry vision.  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  While she did well immediately postoperatively, she has had multiple episodes over the past year of headache, blurry vision, and unilateral weakness.  She has undergone multiple code stroke workups which have been largely negative.  She has been on DAPT. She reports recurrence of similar HA and blurry vision recently.  Past Medical History   Past Medical History:  Diagnosis Date   Allergy    Anxiety    Arthritis    knees and spine, shoulder   Asthma    Bronchitis    hx - recurrent   Complication of anesthesia    waking up is not easy   Depression    Elevated IgE level 09/12/2017   09/12/2017 IgE 195   GERD (gastroesophageal reflux disease)    History of COVID-19 12/30/2020   Hx of irritable bowel syndrome    x2   Hyperlipidemia    diet controlled - no medication   Hypertension    not taking any meds at present - under control per patient   Hypokalemia    with PNA admission (2.5)   Hypothyroidism (acquired)    Ischemic cerebrovascular accident (CVA) (HCC)    Migraines    Osteoporosis    Pneumonia    4 episodes; hosp. admission 2014   PONV (postoperative nausea and vomiting)    TIA (transient ischemic attack) 12/17/2020   UC (ulcerative colitis) [facility])    DX'D 2021    Past Surgical History   Past Surgical History:  Procedure Laterality Date   ANEURYSM COILING     STENT   BREAST SURGERY     implants, then had them removed   COLONOSCOPY     greater 10 yrs ago - ? [facility]-2017 LAST   DILATION AND CURETTAGE OF UTERUS     IR ANGIO INTRA EXTRACRAN SEL INTERNAL CAROTID BILAT MOD SED  11/19/2020   IR ANGIO VERTEBRAL SEL VERTEBRAL BILAT MOD SED  11/19/2020   IR ANGIO VERTEBRAL  SEL VERTEBRAL UNI L MOD SED  02/25/2021   IR ANGIO VERTEBRAL SEL VERTEBRAL UNI L MOD SED  01/13/2022   IR ANGIOGRAM FOLLOW UP STUDY  11/19/2020   IR ANGIOGRAM FOLLOW UP STUDY  11/19/2020   IR ANGIOGRAM FOLLOW UP STUDY  11/19/2020   IR ANGIOGRAM FOLLOW UP STUDY  11/19/2020   IR ANGIOGRAM FOLLOW UP STUDY  11/19/2020   IR ANGIOGRAM FOLLOW UP STUDY  11/19/2020   IR ANGIOGRAM FOLLOW UP STUDY  11/19/2020   IR ANGIOGRAM FOLLOW UP STUDY  11/19/2020   IR INTRA CRAN STENT  11/19/2020   IR TRANSCATH/EMBOLIZ  11/19/2020   LAPAROSCOPIC ABDOMINAL EXPLORATION  01/31/1992   endometriosis   ORIF HUMERUS FRACTURE Left 04/01/2013   DR Magnus Ivan - shoulder   ORIF HUMERUS FRACTURE Left 04/01/2013   Procedure: OPEN REDUCTION INTERNAL FIXATION (ORIF) LEFT PROXIMAL HUMERUS FRACTURE;  Surgeon: Kathryne Hitch, MD;  Location: [facility] OR;  Service: Orthopedics;  Laterality: Left;   RADIOLOGY WITH ANESTHESIA N/A 11/19/2020   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;  Surgeon: Lisbeth Renshaw, MD;  Location: [facility] OR;  Service: Radiology;  Laterality: N/A;   TONSILLECTOMY AND ADENOIDECTOMY      Social History   Social  History   Tobacco Use   Smoking status: Never    Passive exposure: Never   Smokeless tobacco: Never  Vaping Use   Vaping status: Never Used  Substance Use Topics   Alcohol use: Yes    Alcohol/week: 1.0 standard drink of alcohol    Types: 1 Glasses of wine per week    Comment: SOCIALLY   Drug use: No    Medications   Prior to Admission medications   Medication Sig Start Date End Date Taking? Authorizing Provider  amLODipine (NORVASC) 5 MG tablet Take 1 tablet (5 mg total) by mouth daily. Patient taking differently: Take 5 mg by mouth in the morning. 09/13/21  Yes Kuneff, Renee A, DO  aspirin 81 MG chewable tablet Chew 1 tablet (81 mg total) by mouth daily for 21 days. 01/05/22 01/26/22 Yes Gherghe, Daylene Katayama, MD  Budeson-Glycopyrrol-Formoterol (BREZTRI AEROSPHERE)  160-9-4.8 MCG/ACT AERO Inhale 2 puffs into the lungs in the morning and at bedtime. Patient taking differently: Inhale 2 puffs into the lungs 2 (two) times daily as needed (asthma). 04/28/20  Yes Icard, Rachel Bo, DO  Cholecalciferol (VITAMIN D-3 PO) Take 1 capsule by mouth in the morning.   Yes [provider]  clonazePAM (KLONOPIN) 0.5 MG tablet Take 1 tablet (0.5 mg total) by mouth daily. Patient taking differently: Take 0.5 mg by mouth daily as needed for anxiety. 09/13/21  Yes Kuneff, Renee A, DO  clopidogrel (PLAVIX) 75 MG tablet Take 1 tablet (75 mg total) by mouth daily. 01/05/22  Yes Gherghe, Daylene Katayama, MD  ezetimibe (ZETIA) 10 MG tablet Take 1 tablet (10 mg total) by mouth daily. Patient taking differently: Take 10 mg by mouth every evening. 09/16/21  Yes Kuneff, Renee A, DO  levothyroxine (SYNTHROID) 125 MCG tablet Take 1 tablet (125 mcg total) by mouth daily. On an empty stomach. Patient taking differently: Take 125 mcg by mouth in the morning. 09/14/21  Yes Kuneff, Renee A, DO  PARoxetine (PAXIL) 40 MG tablet Take 1 tablet (40 mg total) by mouth daily. Patient taking differently: Take 40 mg by mouth in the morning. 09/13/21  Yes Kuneff, Renee A, DO  rizatriptan (MAXALT) 5 MG tablet Take 1 tablet (5 mg total) by mouth daily as needed for migraine. 09/13/21  Yes Kuneff, Renee A, DO  alendronate (FOSAMAX) 70 MG tablet Take 1 tablet (70 mg total) by mouth every 7 (seven) days. Take with a full glass of water on an empty stomach. Patient taking differently: Take 70 mg by mouth every Monday. 09/13/21   Kuneff, Renee A, DO  ASCORBIC ACID PO Take 1 tablet by mouth every evening. Vitamin C, unknown strength.    [provider]  atorvastatin (LIPITOR) 80 MG tablet Take 1 tablet (80 mg total) by mouth daily. Patient not taking: Reported on 01/04/2022 09/13/21   Felix Pacini A, DO    Allergies   Allergies  Allergen Reactions   Abilify [Aripiprazole] Nausea And Vomiting   Amoxil  [Amoxicillin] Diarrhea   Promethazine Hcl Other (See Comments)    Made pt feel "weird"   Seroquel [Quetiapine Fumarate] Other (See Comments)    Pt does not recall reaction    Review of Systems  ROS  Neurologic Exam  Awake, alert, oriented Memory and concentration grossly intact Speech fluent, appropriate CN grossly intact Motor exam: Upper Extremities Deltoid Bicep Tricep Grip  Right 5/5 5/5 5/5 5/5  Left 5/5 5/5 5/5 5/5   Lower Extremities IP Quad PF DF EHL  Right 5/5  5/5 5/5 5/5 5/5  Left 5/5 5/5 5/5 5/5 5/5   Sensation grossly intact to LT   Impression  - 62 y.o. female Status post stent supported coil embolization of a basilar apex aneurysm with multiple episodes of transient headache and blurry vision likely headache related.  Multiple stroke workups have been negative thus far.  Plan  - Will proceed with diagnostic cerebral angiogram  I have reviewed the indications for the procedure as well as the details of the procedure and the expected postoperative course and recovery at length with the patient in the office. We have also reviewed in detail the risks, benefits, and alternatives to the procedure. All questions were answered and Sherol Dade provided informed consent to proceed.  Lisbeth Renshaw, MD [facility]

## 2022-12-27 ENCOUNTER — Encounter: Payer: Self-pay | Admitting: Gastroenterology

## 2023-01-02 ENCOUNTER — Encounter: Payer: Self-pay | Admitting: Gastroenterology

## 2023-01-02 ENCOUNTER — Ambulatory Visit: Payer: BC Managed Care – PPO | Admitting: Gastroenterology

## 2023-01-02 ENCOUNTER — Other Ambulatory Visit: Payer: Self-pay | Admitting: Gastroenterology

## 2023-01-02 ENCOUNTER — Other Ambulatory Visit: Payer: BC Managed Care – PPO

## 2023-01-02 VITALS — BP 129/84 | HR 63 | Temp 97.7°F | Resp 13 | Ht 66.0 in | Wt 171.0 lb

## 2023-01-02 DIAGNOSIS — K2970 Gastritis, unspecified, without bleeding: Secondary | ICD-10-CM

## 2023-01-02 DIAGNOSIS — K3189 Other diseases of stomach and duodenum: Secondary | ICD-10-CM | POA: Diagnosis present

## 2023-01-02 DIAGNOSIS — G8929 Other chronic pain: Secondary | ICD-10-CM

## 2023-01-02 DIAGNOSIS — K449 Diaphragmatic hernia without obstruction or gangrene: Secondary | ICD-10-CM

## 2023-01-02 DIAGNOSIS — R634 Abnormal weight loss: Secondary | ICD-10-CM

## 2023-01-02 DIAGNOSIS — K219 Gastro-esophageal reflux disease without esophagitis: Secondary | ICD-10-CM

## 2023-01-02 DIAGNOSIS — R197 Diarrhea, unspecified: Secondary | ICD-10-CM | POA: Diagnosis not present

## 2023-01-02 DIAGNOSIS — K580 Irritable bowel syndrome with diarrhea: Secondary | ICD-10-CM

## 2023-01-02 DIAGNOSIS — E538 Deficiency of other specified B group vitamins: Secondary | ICD-10-CM

## 2023-01-02 DIAGNOSIS — K2980 Duodenitis without bleeding: Secondary | ICD-10-CM

## 2023-01-02 MED ORDER — SODIUM CHLORIDE 0.9 % IV SOLN: 500.0000 mL | Freq: Once | INTRAVENOUS | Status: DC

## 2023-01-02 NOTE — Progress Notes (Signed)
Pt's states no medical or surgical changes since previsit or office visit. 

## 2023-01-02 NOTE — Progress Notes (Signed)
Pt taken to the lab to get containers for stool collection.

## 2023-01-02 NOTE — Progress Notes (Signed)
Called to room to assist during endoscopic procedure.  Patient ID and intended procedure confirmed with present staff. Received instructions for my participation in the procedure from the performing physician.  

## 2023-01-02 NOTE — Progress Notes (Signed)
Vss nad trans to pacu 

## 2023-01-02 NOTE — Patient Instructions (Signed)
Handouts provided on gastritis and hiatal hernia.  Resume previous diet.  Continue present medications. Resume Plavix at prior dose tomorrow.  Await pathology results.  Check fecal elastase, fecal calprotectine and GI pathogen panel, if stool studies unrevealing for etiology of diarrhea schedule flex sig with biopsies to exclude microscopic colitis.   YOU HAD AN ENDOSCOPIC PROCEDURE TODAY AT THE [facility]:   Refer to the procedure report that was given to you for any specific questions about what was found during the examination.  If the procedure report does not answer your questions, please call your gastroenterologist to clarify.  If you requested that your care partner not be given the details of your procedure findings, then the procedure report has been included in a sealed envelope for you to review at your convenience later.  YOU SHOULD EXPECT: Some feelings of bloating in the abdomen. Passage of more gas than usual.  Walking can help get rid of the air that was put into your GI tract during the procedure and reduce the bloating. If you had a lower endoscopy (such as a colonoscopy or flexible sigmoidoscopy) you may notice spotting of blood in your stool or on the toilet paper. If you underwent a bowel prep for your procedure, you may not have a normal bowel movement for a few days.  Please Note:  You might notice some irritation and congestion in your nose or some drainage.  This is from the oxygen used during your procedure.  There is no need for concern and it should clear up in a day or so.  SYMPTOMS TO REPORT IMMEDIATELY:  Following upper endoscopy (EGD)  Vomiting of blood or coffee ground material  New chest pain or pain under the shoulder blades  Painful or persistently difficult swallowing  New shortness of breath  Fever of 100F or higher  Black, tarry-looking stools  For urgent or emergent issues, a gastroenterologist can be reached at any hour by calling [phone]  [phone]. Do not use MyChart messaging for urgent concerns.    DIET:  We do recommend a small meal at first, but then you may proceed to your regular diet.  Drink plenty of fluids but you should avoid alcoholic beverages for 24 hours.  ACTIVITY:  You should plan to take it easy for the rest of today and you should NOT DRIVE or use heavy machinery until tomorrow (because of the sedation medicines used during the test).    FOLLOW UP: Our staff will call the number listed on your records the next business day following your procedure.  We will call around 7:15- 8:00 am to check on you and address any questions or concerns that you may have regarding the information given to you following your procedure. If we do not reach you, we will leave a message.     If any biopsies were taken you will be contacted by phone or by letter within the next 1-3 weeks.  Please call us at [phone] if you have not heard about the biopsies in 3 weeks.    SIGNATURES/CONFIDENTIALITY: You and/or your care partner have signed paperwork which will be entered into your electronic medical record.  These signatures attest to the fact that that the information above on your After Visit Summary has been reviewed and is understood.  Full responsibility of the confidentiality of this discharge information lies with you and/or your care-partner.

## 2023-01-02 NOTE — Op Note (Addendum)
[facility] Patient Name: Tiffany Horn Procedure Date: 01/02/2023 1:35 PM MRN: [id number] Endoscopist: Napoleon Form , MD, [id number] Age: 62 Referring MD:  Date of Birth: 1961/01/12 Gender: Female Account #: [id number] Procedure:                Upper GI endoscopy Indications:              Epigastric abdominal pain, Diarrhea, Endoscopy to                            assess diarrhea in patient suspected of having                            celiac disease Medicines:                Monitored Anesthesia Care Procedure:                Pre-Anesthesia Assessment:                           - Prior to the procedure, a History and Physical                            was performed, and patient medications and                            allergies were reviewed. The patient's tolerance of                            previous anesthesia was also reviewed. The risks                            and benefits of the procedure and the sedation                            options and risks were discussed with the patient.                            All questions were answered, and informed consent                            was obtained. Prior Anticoagulants: The patient                            last took Plavix (clopidogrel) 5 days prior to the                            procedure. ASA Grade Assessment: II - A patient                            with mild systemic disease. After reviewing the                            risks and benefits, the patient was deemed in  satisfactory condition to undergo the procedure.                           After obtaining informed consent, the endoscope was                            passed under direct vision. Throughout the                            procedure, the patient's blood pressure, pulse, and                            oxygen saturations were monitored continuously. The                            Olympus Scope SN  [id number] was introduced through the                            mouth, and advanced to the second part of duodenum.                            The upper GI endoscopy was accomplished without                            difficulty. The patient tolerated the procedure                            well. Scope In: Scope Out: Findings:                 The Z-line was regular and was found 35 cm from the                            incisors.                           A 6 cm hiatal hernia was present.                           Patchy mild inflammation characterized by                            congestion (edema), erythema and friability was                            found in the entire examined stomach. Biopsies were                            taken with a cold forceps for Helicobacter pylori                            testing.                           The cardia and gastric fundus were normal on  retroflexion.                           Patchy mildly erythematous mucosa without active                            bleeding and with no stigmata of bleeding was found                            in the first portion of the duodenum and in the                            second portion of the duodenum. Biopsies for                            histology were taken with a cold forceps for                            evaluation of celiac disease. Complications:            No immediate complications. Estimated Blood Loss:     Estimated blood loss was minimal. Impression:               - Z-line regular, 35 cm from the incisors.                           - 6 cm hiatal hernia.                           - Gastritis. Biopsied.                           - Erythematous duodenopathy. Biopsied. Recommendation:           - Patient has a contact number available for                            emergencies. The signs and symptoms of potential                            delayed complications were  discussed with the                            patient. Return to normal activities tomorrow.                            Written discharge instructions were provided to the                            patient.                           - Resume previous diet.                           - Continue present medications.                           -  Await pathology results.                           -Check fecal elastase, fecal calprotectine and GI                            pathogen panel, if stool studies unrevealing for                            etiology of diarrhea schedule flex sig with                            biopsies to exclude microscopic colitis                           - Resume Plavix (clopidogrel) at prior dose                            tomorrow. Refer to managing physician for further                            adjustment of therapy. Napoleon Form, MD 01/02/2023 2:18:43 PM This report has been signed electronically.

## 2023-01-02 NOTE — Progress Notes (Signed)
 Please refer to office visit note 12/15/22. No additional changes in H&P Patient is appropriate for planned procedure(s) and anesthesia in an ambulatory setting  K. Scherry Ran , MD [phone]

## 2023-01-03 ENCOUNTER — Telehealth: Payer: Self-pay

## 2023-01-03 ENCOUNTER — Telehealth: Payer: Self-pay | Admitting: Family Medicine

## 2023-01-03 ENCOUNTER — Ambulatory Visit (INDEPENDENT_AMBULATORY_CARE_PROVIDER_SITE_OTHER): Payer: BC Managed Care – PPO

## 2023-01-03 DIAGNOSIS — E538 Deficiency of other specified B group vitamins: Secondary | ICD-10-CM

## 2023-01-03 MED ORDER — CYANOCOBALAMIN 1000 MCG/ML IJ SOLN
1000.0000 ug | Freq: Once | INTRAMUSCULAR | Status: AC
  Administered 2023-01-03: 1000 ug via INTRAMUSCULAR

## 2023-01-03 NOTE — Telephone Encounter (Signed)
Spoke with patient regarding results/recommendations.  

## 2023-01-03 NOTE — Telephone Encounter (Signed)
  Follow up Call-     01/02/2023   12:59 PM 07/12/2022    8:59 AM 09/20/2020    8:25 AM  Call back number  Post procedure Call Back phone  # [phone]  Permission to leave phone message Yes Yes Yes     Patient questions:  Do you have a fever, pain , or abdominal swelling? No. Pain Score  0 *  Have you tolerated food without any problems? Yes.    Have you been able to return to your normal activities? Yes.    Do you have any questions about your discharge instructions: Diet   No. Medications  No. Follow up visit  No.  Do you have questions or concerns about your Care? No.  Actions: * If pain score is 4 or above: No action needed, pain <4.

## 2023-01-03 NOTE — Progress Notes (Signed)
Pt here for monthly B12 injection per [facility]  B12 1098mg given IM, and pt tolerated injection well.  Next B12 injection scheduled for 1 month   Please sign in absence of PCP

## 2023-01-03 NOTE — Telephone Encounter (Signed)
Tiffany Horn wanted to confirm that she can still get B12 injections or does she need to see Dr. Claiborne Billings to resume shots? It looks like her last injection was in Sept. Please give the patient a call to confirm she does not need to follow up with Dr. Claiborne Billings and she can be scheduled for a nurse visit.

## 2023-01-05 ENCOUNTER — Encounter: Payer: Self-pay | Admitting: Gastroenterology

## 2023-01-05 ENCOUNTER — Other Ambulatory Visit (HOSPITAL_COMMUNITY): Payer: BC Managed Care – PPO

## 2023-01-05 LAB — SURGICAL PATHOLOGY

## 2023-01-11 ENCOUNTER — Encounter: Payer: BC Managed Care – PPO | Admitting: Gastroenterology

## 2023-01-19 ENCOUNTER — Other Ambulatory Visit: Payer: BC Managed Care – PPO

## 2023-01-19 DIAGNOSIS — K580 Irritable bowel syndrome with diarrhea: Secondary | ICD-10-CM

## 2023-01-19 DIAGNOSIS — R634 Abnormal weight loss: Secondary | ICD-10-CM

## 2023-01-22 LAB — CALPROTECTIN, FECAL: Calprotectin, Fecal: 11 ug/g (ref 0–120)

## 2023-01-23 LAB — GASTROINTESTINAL PATHOGEN PNL
CampyloBacter Group: NOT DETECTED
Norovirus GI/GII: NOT DETECTED
Rotavirus A: NOT DETECTED
Salmonella species: NOT DETECTED
Shiga Toxin 1: NOT DETECTED
Shiga Toxin 2: NOT DETECTED
Shigella Species: NOT DETECTED
Vibrio Group: NOT DETECTED
Yersinia enterocolitica: NOT DETECTED

## 2023-01-25 ENCOUNTER — Telehealth: Payer: Self-pay | Admitting: Gastroenterology

## 2023-01-25 NOTE — Telephone Encounter (Signed)
Inbound call from patient requesting call from nurse to discuss recent results she has received.  Please advise.

## 2023-01-26 NOTE — Telephone Encounter (Signed)
If she is having persistent diarrhea, please give her samples for Creon or Zenpep 72,000 -80000 units 3 times daily with meals for 2 to 3 days.  If her symptoms resolve, please send prescription for IBS diarrhea but if still has persistent diarrhea will need flex sig with biopsies to exclude microscopic colitis.  Thank you

## 2023-01-26 NOTE — Telephone Encounter (Signed)
Patient has had some improvement described as "50% better." She would like to wait a few more weeks to see if this resolves itself. Se will call us with any issues, if she acutely worsens or fails to improve.

## 2023-01-30 LAB — PANCREATIC ELASTASE, FECAL: Pancreatic Elastase-1, Stool: >800 ug/g (ref >200)

## 2023-02-05 ENCOUNTER — Ambulatory Visit: Payer: 59 | Admitting: Neurology

## 2023-02-05 ENCOUNTER — Encounter: Payer: Self-pay | Admitting: Neurology

## 2023-02-05 VITALS — BP 129/83 | HR 70 | Ht 67.0 in | Wt 175.2 lb

## 2023-02-05 DIAGNOSIS — G441 Vascular headache, not elsewhere classified: Secondary | ICD-10-CM

## 2023-02-05 DIAGNOSIS — G43009 Migraine without aura, not intractable, without status migrainosus: Secondary | ICD-10-CM

## 2023-02-05 DIAGNOSIS — I671 Cerebral aneurysm, nonruptured: Secondary | ICD-10-CM

## 2023-02-05 MED ORDER — TOPIRAMATE 25 MG PO TABS: 25.0000 mg | ORAL_TABLET | Freq: Two times a day (BID) | ORAL | 3 refills | Status: DC

## 2023-02-05 NOTE — Patient Instructions (Addendum)
 I had a long discussion with the patient with regards to her episodes of complicated migraines as well as retro-orbital icepick headaches which seem like vascular headaches.  I recommend treatment trial of Topamax  25 mg twice daily for headache and migraine prevention.  I also advised her to discontinue taking daily Tylenol  which may be contributing to analgesic rebound.  Avoid headache and migraine triggers.  Continue aspirin  for stroke prevention and follow-up with Dr. 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  Return for follow-up in 6 months with my nurse practitioner or call earlier if necessary.

## 2023-02-05 NOTE — Progress Notes (Signed)
 [facility] [address] [place]. [place] [address] [phone]       OFFICE FOLLOW-UP NOTE  Tiffany Horn Date of Birth:  06/15/1960 Medical Record Number:  [id number]   HPI: Initial visit 01/10/2021 [name] is a 63 year old Caucasian lady seen today for initial office follow-up visit following hospital consultation in November 2022.  He has past medical history of hypertension, hyperlipidemia, asthma, bronchitis, anxiety and thyroid  disease.  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 Lanis on 11/19/2020.  She was seen in the ER 3 weeks later with headaches, dizziness and imbalance.  She was seen in [place], ER and had an MRI scan of the brain done on 12/02/2020 which showed small focus of acute infarct in the right thalamus and right occipital lobe with a right PCA territory coil mass near the tip of the basilar artery residual aneurysm filling noted.  CT angiogram of the brain done on 12/04/2020 showed similar findings.  Patient presented again to the hospital on 12/16/2020 with sudden onset of vertigo, blurred vision and left-sided weakness.  Repeat MRI at this time showed 2 additional infarcts which are punctate in the right cerebellum with expected evolutionary changes of the previously seen right thalamic and right occipital.  She had severe headache at this time as well.  CT angiogram of the head and neck showed focal high-grade stenosis of the right P2 after the end of the stent with diminutive reconstitution of flow in the remainder of the right PCA which is unchanged.  Stable stent assisted coil embolization of the basilar tip aneurysm.  Hemoglobin A1c was 5.4, LDL cholesterol 156 mg percent and 2D echo showed ejection fraction of 60 to 65%.  Lower extremity venous Dopplers 1 negative for DVT.  TCD bubble study was negative for right-to-left shunt.  Patient was continued on aspirin  and Brilinta  and Lipitor ..30-day heart monitor  was ordered but so far has not been done.  Patient states she was doing quite well until this morning when she woke up with severe occipital headache as well as feeling dizzy, off balance and leaning to the left.  Since then the headache has subsided somewhat but not completely gone.  She still feeling nauseous and dizzy and had some blurred vision as well slightly off balance.  I advised her to go to the emergency room for further evaluation for stroke she appeared reluctant initially but then agreed.  I recommended she go by ambulance but patient declined.  She has presented outside time window for thrombolysis and clinical exam is not suggestive of LVO. Update 02/10/2021 : She returns for follow-up after last visit a month ago.  At last visit she had significant dizziness and feeling of off balance and leaning to the left and she was referred urgently by me to the emergency room where she had an MRI scan of the brain on 01/10/2021 which showed a few additional tiny punctate right cerebellar subacute foci of restricted diffusion which could not explain her clinical exam of some left-sided subjective weakness and leaning.  EEG was ordered but I cannot see the results.  Patient was loaded with Keppra  and discharged home on that.  Hypercoagulable panel labs were sent and I have reviewed them and all of them were normal.  Patient states symptoms recently resolved within a day or 2 and she is done well since then she has had no further episodes of dizziness and leaning to the left.  She did not  tolerate Keppra  well due to headache and dizziness and stopped it within a week.  She is on aspirin  and Brilinta  tolerating it well without bleeding or bruising.  She has an upcoming visit with Dr. Lanis in a few weeks.  Her blood pressure is well controlled.  She has no new complaints. Update:02/05/2023 : She returns for follow-up after last visit 2 years ago.  Patient states she is doing currently well.  She however  continues to have 2 different kinds of headaches.  She has had multiple episodes of what has been called complicated migraine.  She had an episode in July 2024 when she was seen with headache with left-sided paresthesias.  Neuroimaging was negative.  She was diagnosed with complicated migraine.  She states she has had 2 or 3 similar but minor episodes since then.  She is unable to admit for specific triggers for these episodes.  She is now learned that these are related to migraine and takes some Tylenol  which she usually gets rid of the headache and episode passes away.  She also reports of different kind of headache which is behind her eyes and is sharp and transient and shooting in quality.  She takes 2 pack Tylenol  powder every day to get rid of the headaches.  She has not been on any specific medication for headache prophylaxis.  She has quit coffee and surprisingly has not noticed any worsening of her headaches.  She does follow-up with Dr. Lanis for her coiled basilar tip aneurysm and last cerebral catheter angiogram on 12/22/2022 had shown stable appearance of the coiled aneurysm.  She has no new complaints today.  MRI brain 08/21/2022 showed no acute abnormality and CT angiogram of the brain 11/27/2022 showed no acute occlusion.  Stenting distal basilar artery with coiling of the basilar tip aneurysm was noted.  There is stenosis of proximal right P2 segment just distal to the stent which is stable. ROS:   14 system review of systems is positive for headache, , dizziness, blurred vision, weakness, incoordination, ataxia and all other systems negative  PMH:  Past Medical History:  Diagnosis Date   Allergy     Anxiety    Arthritis    knees and spine, shoulder   Asthma    Bronchitis    hx - recurrent   Complication of anesthesia    waking up is not easy   Depression    Elevated IgE level 09/12/2017   09/12/2017 IgE 195   GERD (gastroesophageal reflux disease)    History of COVID-19  12/30/2020   Hx of irritable bowel syndrome    x2   Hyperlipidemia    diet controlled - no medication   Hypertension    not taking any meds at present - under control per patient   Hypokalemia    with PNA admission (2.5)   Hypothyroidism (acquired)    Ischemic cerebrovascular accident (CVA) (HCC)    Migraines    Osteoporosis    Pneumonia    4 episodes; hosp. admission 2014   PONV (postoperative nausea and vomiting)    TIA (transient ischemic attack) 12/17/2020   UC (ulcerative colitis) [facility])    DX'D 2021    Social History:  Social History   Socioeconomic History   Marital status: Single    Spouse name: Not on file   Number of children: 0   Years of education: Not on file   Highest education level: Bachelor's degree (e.g., BA, AB, BS)  Occupational History   Occupation:  teacher assistant  Tobacco Use   Smoking status: Never    Passive exposure: Never   Smokeless tobacco: Never  Vaping Use   Vaping status: Never Used  Substance and Sexual Activity   Alcohol use: Yes    Alcohol/week: 1.0 standard drink of alcohol    Types: 1 Glasses of wine per week    Comment: SOCIALLY   Drug use: No   Sexual activity: Yes    Partners: Male    Birth control/protection: Post-menopausal  Other Topics Concern   Not on file  Social History Narrative   She is originally from [place]. She has traveled to [facility], [place], [place] , [place], [place], [place], [place], [place], [place] ,& [place]. No international travel. She has dogs. No prior bird, mold, or recent hot tub exposure. She hasn't used her hot tub in 1.5 years. 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 Previously enjoyed gardening and playing tennis. Helps to care for her mother.   Social Drivers of Corporate Investment Banker Strain: Low Risk  (10/09/2022)   Overall Financial Resource Strain (CARDIA)    Difficulty of Paying Living Expenses: Not hard at all  Food Insecurity: No Food Insecurity (10/09/2022)   Hunger  Vital Sign    Worried About Running Out of Food in the Last Year: Never true    Ran Out of Food in the Last Year: Never true  Transportation Needs: No Transportation Needs (10/09/2022)   PRAPARE - Administrator, Civil Service (Medical): No    Lack of Transportation (Non-Medical): No  Physical Activity: Insufficiently Active (10/09/2022)   Exercise Vital Sign    Days of Exercise per Week: 1 day    Minutes of Exercise per Session: 20 min  Stress: Stress Concern Present (10/09/2022)   [employer] of Occupational Health - Occupational Stress Questionnaire    Feeling of Stress : Very much  Social Connections: Moderately Isolated (10/09/2022)   Social Connection and Isolation Panel [NHANES]    Frequency of Communication with Friends and Family: More than three times a week    Frequency of Social Gatherings with Friends and Family: Twice a week    Attends Religious Services: 1 to 4 times per year    Active Member of [employer] or Organizations: No    Attends Engineer, Structural: Not on file    Marital Status: Divorced  Intimate Partner Violence: Not At Risk (01/05/2022)   Humiliation, Afraid, Rape, and Kick questionnaire    Fear of Current or Ex-Partner: No    Emotionally Abused: No    Physically Abused: No    Sexually Abused: No    Medications:   Current Outpatient Medications on File Prior to Visit  Medication Sig Dispense Refill   alendronate  (FOSAMAX ) 70 MG tablet TAKE 1 TABLET (70 MG TOTAL) BY MOUTH EVERY 7 DAYS WITH FULL GLASS WATER ON EMPTY STOMACH     amLODipine  (NORVASC ) 10 MG tablet Take 1 tablet (10 mg total) by mouth daily. 90 tablet 1   asenapine (SAPHRIS) 5 MG SUBL 24 hr tablet Place under the tongue.     aspirin  EC 81 MG tablet Take 81 mg by mouth daily. Swallow whole.     atorvastatin  (LIPITOR ) 80 MG tablet Take 1 tablet (80 mg total) by mouth daily. 90 tablet 1   Budeson-Glycopyrrol-Formoterol  (BREZTRI  AEROSPHERE) 160-9-4.8 MCG/ACT AERO Inhale 2 puffs  into the lungs in the morning and at bedtime. 5.9 g 1  Cholecalciferol  (VITAMIN D -3 PO) Take 1 capsule by mouth daily.     clonazePAM  (KLONOPIN ) 0.5 MG tablet Take 1 tablet (0.5 mg total) by mouth 2 (two) times daily as needed for anxiety. 180 tablet 1   clopidogrel  (PLAVIX ) 75 MG tablet Take 1 tablet (75 mg total) by mouth daily. 90 tablet 1   Cyanocobalamin  (B-12) 1000 MCG SUBL Place 1,000 mcg under the tongue daily. 90 tablet 3   desvenlafaxine  (PRISTIQ ) 50 MG 24 hr tablet Take 1 tablet (50 mg total) by mouth daily. 90 tablet 1   ezetimibe  (ZETIA ) 10 MG tablet Take 1 tablet (10 mg total) by mouth every evening. 90 tablet 3   folic acid  (FOLVITE ) 1 MG tablet Take 1 tablet (1 mg total) by mouth daily. 90 tablet 1   levothyroxine  (SYNTHROID ) 150 MCG tablet Take 1 tablet (150 mcg total) by mouth daily before breakfast. 90 tablet 1   Multiple Vitamins-Minerals (ZINC PO) Take by mouth daily.     ondansetron  (ZOFRAN -ODT) 4 MG disintegrating tablet Take 1 tablet (4 mg total) by mouth every 8 (eight) hours as needed for nausea or vomiting. 20 tablet 0   pantoprazole  (PROTONIX ) 40 MG tablet Take 1 tablet (40 mg total) by mouth daily. 90 tablet 3   Vitamin D , Ergocalciferol , (DRISDOL ) 1.25 MG (50000 UNIT) CAPS capsule Take 1 capsule (50,000 Units total) by mouth every 14 (fourteen) days. 6 capsule 3   No current facility-administered medications on file prior to visit.    Allergies:   Allergies  Allergen Reactions   Abilify  [Aripiprazole ] Nausea And Vomiting   Amoxil [Amoxicillin] Diarrhea   Promethazine  Hcl Other (See Comments)    Made pt feel weird   Seroquel [Quetiapine Fumarate] Other (See Comments)    Pt does not recall reaction    Physical Exam General: well developed, well nourished middle-aged Caucasian lady, seated, in no evident distress Head: head normocephalic and atraumatic.  Neck: supple with no carotid or supraclavicular bruits Cardiovascular: regular rate and rhythm, no  murmurs Musculoskeletal: no deformity mild tenderness in the left occipital region in the midline. Skin:  no rash/petichiae Vascular:  Normal pulses all extremities Vitals:   02/05/23 1109  BP: 129/83  Pulse: 70   Neurologic Exam Mental Status: Awake and fully alert. Oriented to place and time. Recent and remote memory intact. Attention span, concentration and fund of knowledge appropriate. Mood and affect appropriate.  Cranial Nerves: Fundoscopic exam not done. Pupils equal, briskly reactive to light. Extraocular movements full without nystagmus. Visual fields full to confrontation. Hearing intact. Facial sensation intact. Face, tongue, palate moves normally and symmetrically.  Motor: Normal bulk and tone. Normal strength in all tested extremity muscles. Sensory.: intact to touch ,pinprick .position and vibratory sensation.  Coordination: Intact finger-to-nose and medial coordination bilaterally. Gait and Station: Arises from chair without difficulty. Stance is normal. Gait demonstrates normal stride length and balance .  Unable to do tandem walking.    Reflexes: 1+ and symmetric. Toes downgoing.       ASSESSMENT: 63 year old Caucasian lady with recurrent episodes of complicated migraine as well as vascular headaches which likely represent mixture of atypical migraine with analgesic rebound headaches.  History of basilar tip aneurysm treated with stent assisted coiling in October 2022 with perioperative small right cerebellar and thalamic infarcts in November 2022. Prior episode of dizziness with leaning to the left in December 2022 of unclear etiology as to posterior circulation TIA due to right P2 stenosis versus complicated migraine  .  PLAN: I had a long discussion with the patient with regards to her episodes of complicated migraines as well as retro-orbital icepick headaches which seem like vascular headaches.  I recommend treatment trial of Topamax  25 mg twice daily for headache  and migraine prevention.  I also advised her to discontinue taking daily Tylenol  which may be contributing to analgesic rebound.  Avoid headache and migraine triggers.  Continue aspirin  for stroke prevention and follow-up with Dr. 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  Return for follow-up in 6 months with my nurse practitioner or call earlier if necessary.SABRASABRAGreater than 50% of time during this 40 minute  visit was spent on counseling,explanation of diagnosis, of vascular headaches and coiled basilar tip aneurysm planning of further management, discussion with patient and family and coordination of care Eather Popp, MD Note: This document was prepared with digital dictation and possible smart phrase technology. Any transcriptional errors that result from this process are unintentional

## 2023-02-07 ENCOUNTER — Encounter: Payer: Self-pay | Admitting: Gastroenterology

## 2023-02-12 ENCOUNTER — Ambulatory Visit: Payer: Self-pay

## 2023-03-12 ENCOUNTER — Emergency Department (HOSPITAL_BASED_OUTPATIENT_CLINIC_OR_DEPARTMENT_OTHER): Payer: 59

## 2023-03-12 ENCOUNTER — Telehealth: Payer: Self-pay | Admitting: Gastroenterology

## 2023-03-12 ENCOUNTER — Encounter (HOSPITAL_BASED_OUTPATIENT_CLINIC_OR_DEPARTMENT_OTHER): Payer: Self-pay | Admitting: Emergency Medicine

## 2023-03-12 ENCOUNTER — Ambulatory Visit: Payer: Self-pay | Admitting: Family Medicine

## 2023-03-12 ENCOUNTER — Emergency Department (HOSPITAL_BASED_OUTPATIENT_CLINIC_OR_DEPARTMENT_OTHER)
Admission: EM | Admit: 2023-03-12 | Discharge: 2023-03-12 | Disposition: A | Discharge status: Home / Self Care | Payer: 59 | Attending: Emergency Medicine | Admitting: Emergency Medicine

## 2023-03-12 ENCOUNTER — Other Ambulatory Visit: Payer: Self-pay

## 2023-03-12 DIAGNOSIS — I10 Essential (primary) hypertension: Secondary | ICD-10-CM | POA: Diagnosis not present

## 2023-03-12 DIAGNOSIS — B349 Viral infection, unspecified: Secondary | ICD-10-CM | POA: Diagnosis not present

## 2023-03-12 DIAGNOSIS — E039 Hypothyroidism, unspecified: Secondary | ICD-10-CM | POA: Diagnosis not present

## 2023-03-12 DIAGNOSIS — Z79899 Other long term (current) drug therapy: Secondary | ICD-10-CM | POA: Diagnosis not present

## 2023-03-12 DIAGNOSIS — Z7982 Long term (current) use of aspirin: Secondary | ICD-10-CM | POA: Diagnosis not present

## 2023-03-12 DIAGNOSIS — R109 Unspecified abdominal pain: Secondary | ICD-10-CM | POA: Diagnosis present

## 2023-03-12 LAB — COMPREHENSIVE METABOLIC PANEL
ALT: 17 U/L (ref 0–44)
AST: 16 U/L (ref 15–41)
Albumin: 4 g/dL (ref 3.5–5.0)
Alkaline Phosphatase: 72 U/L (ref 38–126)
Anion gap: 7 (ref 5–15)
BUN: 14 mg/dL (ref 6–20)
CO2: 25 mmol/L (ref 22–32)
Calcium: 9.3 mg/dL (ref 8.9–10.3)
Chloride: 107 mmol/L (ref 98–111)
Creatinine, Ser: 0.76 mg/dL (ref 0.44–1.00)
GFR, Estimated: >60 mL/min (ref >60)
Glucose, Bld: 111 mg/dL — ABNORMAL HIGH (ref 70–99)
Potassium: 3.6 mmol/L (ref 3.5–5.1)
Sodium: 139 mmol/L (ref 135–145)
Total Bilirubin: 0.5 mg/dL (ref 0.0–1.2)
Total Protein: 7.4 g/dL (ref 6.5–8.1)

## 2023-03-12 LAB — LIPASE, BLOOD: Lipase: 34 U/L (ref 11–51)

## 2023-03-12 LAB — CBC
HCT: 41.2 % (ref 36.0–46.0)
Hemoglobin: 14.2 g/dL (ref 12.0–15.0)
MCH: 30.3 pg (ref 26.0–34.0)
MCHC: 34.5 g/dL (ref 30.0–36.0)
MCV: 87.8 fL (ref 80.0–100.0)
Platelets: 265 10*3/uL (ref 150–400)
RBC: 4.69 MIL/uL (ref 3.87–5.11)
RDW: 12.1 % (ref 11.5–15.5)
WBC: 6 10*3/uL (ref 4.0–10.5)
nRBC: 0 % (ref 0.0–0.2)

## 2023-03-12 MED ORDER — ONDANSETRON 4 MG PO TBDP
4.0000 mg | ORAL_TABLET | Freq: Once | ORAL | Status: AC | PRN
  Administered 2023-03-12: 4 mg via ORAL
  Filled 2023-03-12: qty 1

## 2023-03-12 MED ORDER — SODIUM CHLORIDE 0.9 % IV BOLUS
1000.0000 mL | Freq: Once | INTRAVENOUS | Status: AC
  Administered 2023-03-12: 1000 mL via INTRAVENOUS

## 2023-03-12 MED ORDER — ONDANSETRON 4 MG PO TBDP: 4.0000 mg | ORAL_TABLET | Freq: Three times a day (TID) | ORAL | 0 refills | Status: DC | PRN

## 2023-03-12 NOTE — Telephone Encounter (Signed)
 Inbound call from patient, states she has seen no improvement since last procedure done. She states she is still having continued "gastro issues" and would like to discuss further with a nurse.

## 2023-03-12 NOTE — Telephone Encounter (Signed)
 Spoke with the patient. She has continued to have "same GI problem" as she was originally seen for. She reports continued semi-formed stools and abdominal cramps. She would like to try the pancreatic enzymes offered. Zenpep 40,000 take 2 capsule with each meal sample capsules given to the patient to try for 2 to 3 days. She will let us  know if she feels improvement.

## 2023-03-12 NOTE — Telephone Encounter (Signed)
  Chief Complaint: weakness Symptoms: weakness, diarrhea Frequency: constant  Disposition: [] ED /[x] Urgent Care (no appt availability in office) / [] Appointment(In office/virtual)/ []  [facility]/ [] Home Care/ [] Refused Recommended Disposition /[] [place] [facility]/ []  Follow-up with PCP Additional Notes: Pt complaining of weakness since 2/6 but getting worse. Pt went to urgent care on 2/6 and was diagnosed with Strep throat. Covid and flu tests were negative. Pt states she has taken  her last  antibiotic of Azithromycin  today. Pt states she still has a fever of 99.4. Pt states she has not been able to keep food in her. Pt states the diarrhea and nausea have been intense. Pt has lost 8 pounds with this sickness. Per protocol, pt to be seen within 24 hours. Pt has decline PCP appt and has decided to go to  to urgent care today. RN gave care advice and pt verbalized understanding.             Reason for Disposition  [1] MODERATE weakness (i.e., interferes with work, school, normal activities) AND [2] cause unknown  (Exceptions: Weakness from acute minor illness or poor fluid intake; weakness is chronic and not worse.)  Answer Assessment - Initial Assessment Questions 1. DESCRIPTION: "Describe how you are feeling."     Unable to keep food down so weak 2. SEVERITY: "How bad is it?"  "Can you stand and walk?"   - MILD (0-3): Feels weak or tired, but does not interfere with work, school or normal activities.   - MODERATE (4-7): Able to stand and walk; weakness interferes with work, school, or normal activities.   - SEVERE (8-10): Unable to stand or walk; unable to do usual activities.     mild 3. ONSET: "When did these symptoms begin?" (e.g., hours, days, weeks, months)     Last Wednesday  4. CAUSE: "What do you think is causing the weakness or fatigue?" (e.g., not drinking enough fluids, medical problem, trouble sleeping)     Strep diagnosed on 2/6 5. NEW MEDICINES:  "Have  you started on any new medicines recently?" (e.g., opioid pain medicines, benzodiazepines, muscle relaxants, antidepressants, antihistamines, neuroleptics, beta blockers)     Azithromycin   6. OTHER SYMPTOMS: "Do you have any other symptoms?" (e.g., chest pain, fever, cough, SOB, vomiting, diarrhea, bleeding, other areas of pain)     Diarrhea, cough, mild SOB,  Protocols used: Weakness (Generalized) and Fatigue-A-AH

## 2023-03-12 NOTE — Telephone Encounter (Signed)
 Called. No answer. Left message of my call.

## 2023-03-12 NOTE — Telephone Encounter (Signed)
 No further action needed.

## 2023-03-12 NOTE — ED Notes (Signed)
 Patient transported to X-ray

## 2023-03-12 NOTE — ED Provider Notes (Signed)
 [facility] Provider Note   CSN: [id number] Arrival date & time: 03/12/23  1253     History  Chief Complaint  Patient presents with   Abdominal Pain    Tiffany Horn is a 63 y.o. female.   Abdominal Pain 63 year old female history of hypertension, hyperlipidemia, hypothyroidism presenting for multiple concerns.  On Wednesday she developed sore throat, cough, subjective fevers and was tested for COVID, flu, strep.  She tested positive for strep and was put on azithromycin .  She finished this today.  Her cough is somewhat improved as is her sore throat.  However she still is having some nausea, no vomiting.  She is having diarrhea and some belly pain with diarrhea when she eats.  She does not have any belly pain at rest.  She has had some unintentional weight loss.  No urinary symptoms.  No chest pain.     Home Medications Prior to Admission medications   Medication Sig Start Date End Date Taking? Authorizing Provider  ondansetron  (ZOFRAN -ODT) 4 MG disintegrating tablet Take 1 tablet (4 mg total) by mouth every 8 (eight) hours as needed for nausea or vomiting. 03/12/23  Yes Coleman Daughters, MD  alendronate  (FOSAMAX ) 70 MG tablet TAKE 1 TABLET (70 MG TOTAL) BY MOUTH EVERY 7 DAYS WITH FULL GLASS WATER ON EMPTY STOMACH    [provider]  amLODipine  (NORVASC ) 10 MG tablet Take 1 tablet (10 mg total) by mouth daily. 10/13/22   Kuneff, Renee A, DO  asenapine (SAPHRIS) 5 MG SUBL 24 hr tablet Place under the tongue.    [provider]  aspirin  EC 81 MG tablet Take 81 mg by mouth daily. Swallow whole.    [provider]  atorvastatin  (LIPITOR ) 80 MG tablet Take 1 tablet (80 mg total) by mouth daily. 10/13/22   Kuneff, Renee A, DO  Budeson-Glycopyrrol-Formoterol  (BREZTRI  AEROSPHERE) 160-9-4.8 MCG/ACT AERO Inhale 2 puffs into the lungs in the morning and at bedtime. 04/28/20   Icard, Dariel Edelson L, DO  Cholecalciferol  (VITAMIN  D-3 PO) Take 1 capsule by mouth daily.    [provider]  clonazePAM  (KLONOPIN ) 0.5 MG tablet Take 1 tablet (0.5 mg total) by mouth 2 (two) times daily as needed for anxiety. 09/05/22   Kuneff, Renee A, DO  clopidogrel  (PLAVIX ) 75 MG tablet Take 1 tablet (75 mg total) by mouth daily. 10/13/22   Kuneff, Renee A, DO  Cyanocobalamin  (B-12) 1000 MCG SUBL Place 1,000 mcg under the tongue daily. 10/16/22   Kuneff, Renee A, DO  desvenlafaxine  (PRISTIQ ) 50 MG 24 hr tablet Take 1 tablet (50 mg total) by mouth daily. 11/03/22   Kuneff, Renee A, DO  ezetimibe  (ZETIA ) 10 MG tablet Take 1 tablet (10 mg total) by mouth every evening. 10/13/22   Kuneff, Renee A, DO  folic acid  (FOLVITE ) 1 MG tablet Take 1 tablet (1 mg total) by mouth daily. 10/16/22   Kuneff, Renee A, DO  levothyroxine  (SYNTHROID ) 150 MCG tablet Take 1 tablet (150 mcg total) by mouth daily before breakfast. 10/13/22   Kuneff, Renee A, DO  Multiple Vitamins-Minerals (ZINC PO) Take by mouth daily.    [provider]  pantoprazole  (PROTONIX ) 40 MG tablet Take 1 tablet (40 mg total) by mouth daily. 07/21/22   Nandigam, Kavitha V, MD  topiramate  (TOPAMAX ) 25 MG tablet Take 1 tablet (25 mg total) by mouth 2 (two) times daily. 02/05/23   Lisabeth Rider, MD  Vitamin D , Ergocalciferol , (DRISDOL ) 1.25  MG (50000 UNIT) CAPS capsule Take 1 capsule (50,000 Units total) by mouth every 14 (fourteen) days. 07/06/22   Kuneff, Renee A, DO      Allergies    Abilify  [aripiprazole ], Amoxil [amoxicillin], Promethazine  hcl, and Seroquel [quetiapine fumarate]    Review of Systems   Review of Systems  Gastrointestinal:  Positive for abdominal pain.  Review of systems completed and notable as per HPI.  ROS otherwise negative.   Physical Exam Updated Vital Signs BP 115/80   Pulse 81   Temp 98.7 F (37.1 C) (Oral)   Resp 16   Ht 5' 6.5" (1.689 m)   Wt 75.3 kg   LMP 01/31/2007   SpO2 97%   BMI 26.39 kg/m  Physical Exam Vitals and nursing note  reviewed.  Constitutional:      General: She is not in acute distress.    Appearance: She is well-developed.  HENT:     Head: Normocephalic and atraumatic.     Nose: Nose normal.     Mouth/Throat:     Mouth: Mucous membranes are moist.     Pharynx: Oropharynx is clear. No oropharyngeal exudate or posterior oropharyngeal erythema.  Eyes:     Extraocular Movements: Extraocular movements intact.     Conjunctiva/sclera: Conjunctivae normal.     Pupils: Pupils are equal, round, and reactive to light.  Cardiovascular:     Rate and Rhythm: Normal rate and regular rhythm.     Pulses: Normal pulses.     Heart sounds: Normal heart sounds. No murmur heard. Pulmonary:     Effort: Pulmonary effort is normal. No respiratory distress.     Breath sounds: Normal breath sounds.  Abdominal:     Palpations: Abdomen is soft.     Tenderness: There is no abdominal tenderness. There is no guarding or rebound.  Musculoskeletal:        General: No swelling.     Cervical back: Normal range of motion and neck supple. No rigidity or tenderness.     Right lower leg: No edema.     Left lower leg: No edema.  Skin:    General: Skin is warm and dry.     Capillary Refill: Capillary refill takes less than 2 seconds.  Neurological:     Mental Status: She is alert and oriented to person, place, and time. Mental status is at baseline.  Psychiatric:        Mood and Affect: Mood normal.     ED Results / Procedures / Treatments   Labs (all labs ordered are listed, but only abnormal results are displayed) Labs Reviewed  COMPREHENSIVE METABOLIC PANEL - Abnormal; Notable for the following components:      Result Value   Glucose, Bld 111 (*)    All other components within normal limits  LIPASE, BLOOD  CBC  URINALYSIS, ROUTINE W REFLEX MICROSCOPIC    EKG EKG Interpretation Date/Time:  Monday March 12 2023 18:25:52 EST Ventricular Rate:  66 PR Interval:  161 QRS Duration:  78 QT Interval:  404 QTC  Calculation: 424 R Axis:   65  Text Interpretation: Sinus rhythm Anteroseptal infarct, age indeterminate Confirmed by Rueben Cote [id number]) on 03/12/2023 6:32:58 PM  Radiology DG Chest 2 View Result Date: 03/12/2023 CLINICAL DATA:  Vomiting. EXAM: CHEST - 2 VIEW COMPARISON:  February 13, 2022 FINDINGS: The heart size and mediastinal contours are within normal limits. Both lungs are clear. A chronic compression fracture deformity is noted at the level of T12. A radiopaque  fixation plate and screws are seen along the proximal left humerus. IMPRESSION: No active cardiopulmonary disease. Electronically Signed   By: Virgle Grime M.D.   On: 03/12/2023 19:28    Procedures Procedures    Medications Ordered in ED Medications  ondansetron  (ZOFRAN -ODT) disintegrating tablet 4 mg (4 mg Oral Given 03/12/23 1342)  sodium chloride  0.9 % bolus 1,000 mL (1,000 mLs Intravenous New Bag/Given 03/12/23 1843)    ED Course/ Medical Decision Making/ A&P                                 Medical Decision Making Amount and/or Complexity of Data Reviewed Labs: ordered. Radiology: ordered.  Risk Prescription drug management.   Medical Decision Making:   Tiffany Horn is a 63 y.o. female who presented to the ED today with cough, congestion, sore throat, diarrhea.  All signs reviewed.  Patient symptoms seem more consistent with likely viral syndrome.  Has been treated for strep, she has no signs or symptoms of strep on exam no signs of deep space infection including PTA, RPA, Ludwig's angina.  Will obtain chest x-ray given persistent cough to eval for possible pneumonia.  Will give some IV fluids as well given diarrhea.  She has no abdominal pain and no tenderness on exam, I have low suspicion for appendicitis, cholecystitis, diverticulitis especially with no leukocytosis and normal lab workup.   Patient placed on continuous vitals and telemetry monitoring while in ED which was reviewed periodically.   Reviewed and confirmed nursing documentation for past medical history, family history, social history.   Reassessment and Plan:   On reassessment she is feeling better.  She is tolerant p.o.  No abdominal pain, repeat abdominal exam is benign.  Chest x-ray without pneumonia.  I discussed with her I think this is likely a viral syndrome.  I do not see an indication for antibiotics.  Given no abdominal pain I do not think there is indication for abdominal imaging at this time.  Recommend close PCP follow-up.  Will discharge with Zofran .  Encouraged p.o. hydration at home.   Patient's presentation is most consistent with acute complicated illness / injury requiring diagnostic workup.           Final Clinical Impression(s) / ED Diagnoses Final diagnoses:  Viral syndrome    Rx / DC Orders ED Discharge Orders          Ordered    ondansetron  (ZOFRAN -ODT) 4 MG disintegrating tablet  Every 8 hours PRN        03/12/23 1959              Arena Lindahl H, MD 03/12/23 [phone]

## 2023-03-12 NOTE — Discharge Instructions (Addendum)
 Your lab work and x-ray today were reassuring.  I recommend you follow up closely with your primary care doctor.  I suspect her symptoms are due to a viral infection.  You can take the prescribed nausea medication and should drink plenty of fluids.  If you develop severe pain, difficulty breathing or any other new concerning symptoms you should return to the ED.

## 2023-03-12 NOTE — ED Notes (Signed)
 ED Provider at bedside.

## 2023-03-12 NOTE — ED Triage Notes (Signed)
 Pt dx with Strep on Thursday. Finished antibiotic Rx, still feeling unwell. Reports poor po intake, generalized weakness. Denies sore throat. C/o diarrhea.

## 2023-03-19 NOTE — Telephone Encounter (Signed)
Patient took the Zenpep as directed. First 2 days it seemed to "do something" because she did not have diarrhea and abdominal pain after eating. Then the symptoms returned. She began having diarrhea and "doubled over" abdominal pain if she ate. She has stopped the Zenpep. She complains that she is unable to eat, she is losing weight and she has abdominal pain. She is interested in having a CT scan asap.

## 2023-03-19 NOTE — Telephone Encounter (Signed)
Inbound call from patient requesting a call to discuss symptoms. Stated medication has not been helping and was at the ED last week and would like to discuss information that she was told by the provider. Please advise, thank you.

## 2023-03-20 ENCOUNTER — Other Ambulatory Visit: Payer: Self-pay

## 2023-03-20 DIAGNOSIS — R634 Abnormal weight loss: Secondary | ICD-10-CM

## 2023-03-20 DIAGNOSIS — G8929 Other chronic pain: Secondary | ICD-10-CM

## 2023-03-20 DIAGNOSIS — R197 Diarrhea, unspecified: Secondary | ICD-10-CM

## 2023-03-20 NOTE — Telephone Encounter (Signed)
Called the patient to discuss. No answer. Left message on her voicemail.

## 2023-03-20 NOTE — Telephone Encounter (Signed)
Message sent to the patient. CT ordered at [facility]. Appointment pending.

## 2023-03-20 NOTE — Telephone Encounter (Signed)
Agree with CT abdomen pelvis with contrast, please order it.  If she has persistent diarrhea, we should consider flexible sigmoidoscopy with biopsies to exclude microscopic colitis.  Please schedule office visit either with me or APP next available appointment.  Thank you

## 2023-03-21 NOTE — Telephone Encounter (Addendum)
 Patient is scheduled for the CT 03/23/23. Spoke with the patient. She will follow up with Hyacinth Meeker, PA-C on 04/05/23 at 1:30 pm. Appointment held.

## 2023-03-23 ENCOUNTER — Ambulatory Visit
Admission: RE | Admit: 2023-03-23 | Discharge: 2023-03-23 | Disposition: A | Discharge status: Home / Self Care | Payer: 59 | Source: Ambulatory Visit | Attending: Gastroenterology | Admitting: Gastroenterology

## 2023-03-23 ENCOUNTER — Other Ambulatory Visit: Payer: 59

## 2023-03-23 DIAGNOSIS — R634 Abnormal weight loss: Secondary | ICD-10-CM

## 2023-03-23 DIAGNOSIS — G8929 Other chronic pain: Secondary | ICD-10-CM

## 2023-03-23 DIAGNOSIS — R197 Diarrhea, unspecified: Secondary | ICD-10-CM

## 2023-03-23 MED ORDER — IOPAMIDOL (ISOVUE-300) INJECTION 61%
100.0000 mL | Freq: Once | INTRAVENOUS | Status: AC | PRN
  Administered 2023-03-23: 100 mL via INTRAVENOUS

## 2023-03-25 ENCOUNTER — Encounter: Payer: Self-pay | Admitting: Family Medicine

## 2023-03-28 ENCOUNTER — Ambulatory Visit (INDEPENDENT_AMBULATORY_CARE_PROVIDER_SITE_OTHER): Payer: 59 | Admitting: Family Medicine

## 2023-03-28 ENCOUNTER — Encounter: Payer: Self-pay | Admitting: Family Medicine

## 2023-03-28 ENCOUNTER — Telehealth: Payer: Self-pay | Admitting: Gastroenterology

## 2023-03-28 VITALS — BP 116/82 | HR 79 | Temp 98.4°F | Wt 177.0 lb

## 2023-03-28 DIAGNOSIS — E538 Deficiency of other specified B group vitamins: Secondary | ICD-10-CM

## 2023-03-28 DIAGNOSIS — F419 Anxiety disorder, unspecified: Secondary | ICD-10-CM

## 2023-03-28 DIAGNOSIS — F330 Major depressive disorder, recurrent, mild: Secondary | ICD-10-CM | POA: Diagnosis not present

## 2023-03-28 MED ORDER — DESVENLAFAXINE SUCCINATE ER 50 MG PO TB24: 50.0000 mg | ORAL_TABLET | Freq: Every day | ORAL | 1 refills | Status: DC

## 2023-03-28 MED ORDER — ESCITALOPRAM OXALATE 10 MG PO TABS: 10.0000 mg | ORAL_TABLET | Freq: Every day | ORAL | 1 refills | Status: DC

## 2023-03-28 MED ORDER — CYANOCOBALAMIN 1000 MCG/ML IJ SOLN
1000.0000 ug | Freq: Once | INTRAMUSCULAR | Status: AC
  Administered 2023-03-28: 1000 ug via INTRAMUSCULAR

## 2023-03-28 NOTE — Progress Notes (Signed)
 Tiffany Horn , [birth year], 63 y.o., female MRN: [id number] Patient Care Team    Relationship Specialty Notifications Start End  Natalia Leatherwood, DO PCP - General Family Medicine  08/21/22   Jake Bathe, MD PCP - Cardiology Cardiology  08/21/22   Napoleon Form, MD Consulting Physician Gastroenterology  05/29/16   Ernestina Penna, MD Referring Physician Obstetrics and Gynecology  07/17/17   Josephine Igo, DO Consulting Physician Pulmonary Disease  06/12/19   Patton Salles, MD Consulting Physician Obstetrics and Gynecology  08/30/22     Chief Complaint  Patient presents with   Depression   Anxiety    Pt inquiring about changes from pristiq to lexapro.      Subjective: Tiffany Horn is a  63 y.o. Pt presents for an OV  Major depressive disorder, recurrent episode, mild (HCC)/Anxiety Patient presents today and states that the Pristiq 50 mg is not enough to cover her anxiety well.  She has been tried on Pristiq 100 mg, but that was not covering her anxiety well.  Pristiq was then decreased to 500 was added.  Unfortunately she had reaction to Viibryd.  Pristiq 50 mg was continued, at the follow-up visit patient felt that she was doing okay.  Today however she feels like Pristiq 50 is not enough.  She has heard about medication called Lexapro and is wondering if that would be a good fit for her.  Patient has had [facility] testing, Viibryd and Pristiq are the preferred and Lexapro is in the moderate range.  Patient is aware.  Prior note: At the last appointment patient was weaning off the Pristiq and started the Viibryd due to reports of inadequate coverage.  However after the Pristiq was completely tapered off she endorsed having panic and could not sleep.  She was seen by another provider which took her off the Viibryd and restarted Pristiq at 50 mg a day, and since that time she states she is doing really well.  She is no longer taking the BuSpar either  Prior  note: Patient reports she feels about the same as she did on her prior medication regimens and does not see great improvement on the Pristiq/BuSpar regimen, despite acknowledging the recommendations of best suited meds for her by [employer] testing.  She is attending therapy twice a week.  Has tapered up to the Pristiq 100 mg daily and BuSpar 10 mg twice daily for about 5 weeks now.  She reports she is getting out and walking daily.  She has been socializing with her friends and not isolating.  She has been hit with some unfortunate circumstances where she has lost her job and is looking for a new job.  She had a friend passed away from colon cancer unexpectedly.  One of her 20 year old dogs recently died.  She has been in contact with her ex-boyfriend and is considering getting back together with him.      11/03/2022    1:30 PM 10/13/2022    9:01 AM 09/05/2022    2:08 PM 07/05/2022    9:34 AM 09/13/2021    2:52 PM  Depression screen PHQ 2/9  Decreased Interest 1 2 2 3 1   Down, Depressed, Hopeless 1 3 3 3  0  PHQ - 2 Score 2 5 5 6 1   Altered sleeping 2 2 3 3 3   Tired, decreased energy 1 3 2 3 2   Change in appetite 0 3 3 1  0  Feeling bad or failure about yourself  0 2 3 1  0  Trouble concentrating 1 3 2 2 2   Moving slowly or fidgety/restless 0 2 3 0 0  Suicidal thoughts 0 0 1 0 0  PHQ-9 Score 6 20 22 16 8   Difficult doing work/chores Somewhat difficult Very difficult Very difficult Somewhat difficult     Allergies  Allergen Reactions   Abilify [Aripiprazole] Nausea And Vomiting   Amoxil [Amoxicillin] Diarrhea   Promethazine Hcl Other (See Comments)    Made pt feel "weird"   Seroquel [Quetiapine Fumarate] Other (See Comments)    Pt does not recall reaction   Social History   Social History Narrative   She is originally from [place]. She has traveled to [facility], [place], [place], [place], [place], [place], [place], [place], [place]. No international travel. She has dogs. No prior bird, mold, or recent hot tub exposure.  She hasn't used her hot tub in 1.5 years. 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 Previously enjoyed gardening and playing tennis. Helps to care for her mother.   Past Medical History:  Diagnosis Date   Allergy    Anxiety    Arthritis    knees and spine, shoulder   Asthma    Bronchitis    hx - recurrent   Complication of anesthesia    waking up is not easy   Depression    Elevated IgE level 09/12/2017   09/12/2017 IgE 195   GERD (gastroesophageal reflux disease)    History of COVID-19 12/30/2020   Hx of irritable bowel syndrome    x2   Hyperlipidemia    diet controlled - no medication   Hypertension    not taking any meds at present - under control per patient   Hypokalemia    with [facility] admission (2.5)   Hypothyroidism (acquired)    Ischemic cerebrovascular accident (CVA) (HCC)    Migraines    Osteoporosis    Pneumonia    4 episodes; hosp. admission 2014   PONV (postoperative nausea and vomiting)    TIA (transient ischemic attack) 12/17/2020   UC (ulcerative colitis) [facility])    DX'D 2021   Past Surgical History:  Procedure Laterality Date   ANEURYSM COILING     STENT   BREAST SURGERY     implants, then had them removed   COLONOSCOPY     greater 10 yrs ago - ? [facility]-2017 LAST   DILATION AND CURETTAGE OF UTERUS     IR ANGIO INTRA EXTRACRAN SEL INTERNAL CAROTID BILAT MOD SED  11/19/2020   IR ANGIO VERTEBRAL SEL VERTEBRAL BILAT MOD SED  11/19/2020   IR ANGIO VERTEBRAL SEL VERTEBRAL UNI L MOD SED  02/25/2021   IR ANGIO VERTEBRAL SEL VERTEBRAL UNI L MOD SED  01/13/2022   IR ANGIO VERTEBRAL SEL VERTEBRAL UNI L MOD SED  12/22/2022   IR ANGIOGRAM FOLLOW UP STUDY  11/19/2020   IR ANGIOGRAM FOLLOW UP STUDY  11/19/2020   IR ANGIOGRAM FOLLOW UP STUDY  11/19/2020   IR ANGIOGRAM FOLLOW UP STUDY  11/19/2020   IR ANGIOGRAM FOLLOW UP STUDY  11/19/2020   IR ANGIOGRAM FOLLOW UP STUDY  11/19/2020   IR ANGIOGRAM FOLLOW UP  STUDY  11/19/2020   IR ANGIOGRAM FOLLOW UP STUDY  11/19/2020   IR INTRA CRAN STENT  11/19/2020   IR TRANSCATH/EMBOLIZ  11/19/2020   LAPAROSCOPIC ABDOMINAL EXPLORATION  01/31/1992   endometriosis  ORIF HUMERUS FRACTURE Left 04/01/2013   DR Magnus Ivan - shoulder   ORIF HUMERUS FRACTURE Left 04/01/2013   Procedure: OPEN REDUCTION INTERNAL FIXATION (ORIF) LEFT PROXIMAL HUMERUS FRACTURE;  Surgeon: Kathryne Hitch, MD;  Location: [facility] OR;  Service: Orthopedics;  Laterality: Left;   RADIOLOGY WITH ANESTHESIA N/A 11/19/2020   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;  Surgeon: Lisbeth Renshaw, MD;  Location: [facility] OR;  Service: Radiology;  Laterality: N/A;   TONSILLECTOMY AND ADENOIDECTOMY     Family History  Problem Relation Age of Onset   Breast cancer Mother    Diabetes Mother    Heart disease Mother    Diabetes Father    Dementia Father    Colon cancer Father    Stroke Paternal Grandmother    Diabetes Paternal Grandmother    Colon polyps Neg Hx    Esophageal cancer Neg Hx    Stomach cancer Neg Hx    Rectal cancer Neg Hx    Crohn's disease Neg Hx    Ulcerative colitis Neg Hx    Allergies as of 03/28/2023       Reactions   Abilify [aripiprazole] Nausea And Vomiting   Amoxil [amoxicillin] Diarrhea   Promethazine Hcl Other (See Comments)   Made pt feel "weird"   Seroquel [quetiapine Fumarate] Other (See Comments)   Pt does not recall reaction        Medication List        Accurate as of March 28, 2023  2:22 PM. If you have any questions, ask your nurse or doctor.          alendronate 70 MG tablet Commonly known as: FOSAMAX TAKE 1 TABLET (70 MG TOTAL) BY MOUTH EVERY 7 DAYS WITH FULL GLASS WATER ON EMPTY STOMACH   amLODipine 10 MG tablet Commonly known as: NORVASC Take 1 tablet (10 mg total) by mouth daily.   asenapine 5 MG Subl 24 hr tablet Commonly known as: SAPHRIS Place under the tongue.   aspirin EC 81 MG tablet Take 81 mg by mouth  daily. Swallow whole.   atorvastatin 80 MG tablet Commonly known as: LIPITOR Take 1 tablet (80 mg total) by mouth daily.   B-12 1000 MCG Subl Place 1,000 mcg under the tongue daily.   Breztri Aerosphere 160-9-4.8 MCG/ACT Aero Generic drug: Budeson-Glycopyrrol-Formoterol Inhale 2 puffs into the lungs in the morning and at bedtime.   clonazePAM 0.5 MG tablet Commonly known as: KLONOPIN Take 1 tablet (0.5 mg total) by mouth 2 (two) times daily as needed for anxiety.   clopidogrel 75 MG tablet Commonly known as: PLAVIX Take 1 tablet (75 mg total) by mouth daily.   desvenlafaxine 50 MG 24 hr tablet Commonly known as: Pristiq Take 1 tablet (50 mg total) by mouth daily.   escitalopram 10 MG tablet Commonly known as: Lexapro Take 1 tablet (10 mg total) by mouth daily. Started by: Felix Pacini   ezetimibe 10 MG tablet Commonly known as: Zetia Take 1 tablet (10 mg total) by mouth every evening.   folic acid 1 MG tablet Commonly known as: FOLVITE Take 1 tablet (1 mg total) by mouth daily.   levothyroxine 150 MCG tablet Commonly known as: Synthroid Take 1 tablet (150 mcg total) by mouth daily before breakfast.   ondansetron 4 MG disintegrating tablet Commonly known as: ZOFRAN-ODT Take 1 tablet (4 mg total) by mouth every 8 (eight) hours as needed for nausea or vomiting.   pantoprazole 40 MG tablet Commonly known as: PROTONIX Take  1 tablet (40 mg total) by mouth daily.   topiramate 25 MG tablet Commonly known as: TOPAMAX Take 1 tablet (25 mg total) by mouth 2 (two) times daily.   Vitamin D (Ergocalciferol) 1.25 MG (50000 UNIT) Caps capsule Commonly known as: DRISDOL Take 1 capsule (50,000 Units total) by mouth every 14 (fourteen) days.   VITAMIN D-3 PO Take 1 capsule by mouth daily.   ZINC PO Take by mouth daily.        All past medical history, surgical history, allergies, family history, immunizations andmedications were updated in the EMR today and reviewed  under the history and medication portions of their EMR.     ROS Negative, with the exception of above mentioned in HPI   Objective:  BP 116/82   Pulse 79   Temp 98.4 F (36.9 C)   Wt 177 lb (80.3 kg)   LMP 01/31/2007   SpO2 95%   BMI 28.14 kg/m  Body mass index is 28.14 kg/m. Physical Exam Vitals and nursing note reviewed.  Constitutional:      General: She is not in acute distress.    Appearance: Normal appearance. She is normal weight. She is not ill-appearing or toxic-appearing.     Comments: Looks very well today.  Smiling and appears happy  HENT:     Head: Normocephalic and atraumatic.  Eyes:     General: No scleral icterus.       Right eye: No discharge.        Left eye: No discharge.     Extraocular Movements: Extraocular movements intact.     Conjunctiva/sclera: Conjunctivae normal.     Pupils: Pupils are equal, round, and reactive to light.  Skin:    Findings: No rash.  Neurological:     Mental Status: She is alert and oriented to person, place, and time. Mental status is at baseline.     Motor: No weakness.     Coordination: Coordination normal.     Gait: Gait normal.  Psychiatric:        Mood and Affect: Mood normal.        Behavior: Behavior normal.        Thought Content: Thought content normal.        Judgment: Judgment normal.    No results found. No results found. No results found for this or any previous visit (from the past 24 hours).  Assessment/Plan: Tiffany Horn is a 63 y.o. female present for OV for  Major depressive disorder, recurrent episode, mild (HCC)/anxiety/moodiness Could use added coverage. Lengthy discussion surrounding medication changes. Continue Pristiq 50 mg daily for now. Start Lexapro 10 mg daily. Can continue Klonopin as it is for now, hopefully will be to discontinue future  B 12 deficiency: B12 injection provided today   Reviewed expectations re: course of current medical issues. Discussed self-management of  symptoms. Outlined signs and symptoms indicating need for more acute intervention. Patient verbalized understanding and all questions were answered. Patient received an After-Visit Summary.  Return in about 8 weeks (around 05/23/2023).   No orders of the defined types were placed in this encounter.  Meds ordered this encounter  Medications   cyanocobalamin (VITAMIN B12) injection 1,000 mcg   escitalopram (LEXAPRO) 10 MG tablet    Sig: Take 1 tablet (10 mg total) by mouth daily.    Dispense:  90 tablet    Refill:  1   desvenlafaxine (PRISTIQ) 50 MG 24 hr tablet    Sig: Take 1 tablet (  50 mg total) by mouth daily.    Dispense:  90 tablet    Refill:  1   Referral Orders  No referral(s) requested today     Note is dictated utilizing voice recognition software. Although note has been proof read prior to signing, occasional typographical errors still can be missed. If any questions arise, please do not hesitate to call for verification.   electronically signed by:  Felix Pacini, DO  [facility] - OR

## 2023-03-28 NOTE — Patient Instructions (Addendum)
 Return in about 8 weeks (around 05/23/2023).        Great to see you today.  I have refilled the medication(s) we provide.   If labs were collected or images ordered, we will inform you of  results once we have received them and reviewed. We will contact you either by echart message, or telephone call.  Please give ample time to the testing facility, and our office to run,  receive and review results. Please do not call inquiring of results, even if you can see them in your chart. We will contact you as soon as we are able. If it has been over 1 week since the test was completed, and you have not yet heard from [place], then please call us.    - echart message- for normal results that have been seen by the patient already.   - telephone call: abnormal results or if patient has not viewed results in their echart.  If a referral to a specialist was entered for you, please call us in 2 weeks if you have not heard from the specialist office to schedule.

## 2023-03-28 NOTE — Telephone Encounter (Signed)
 Provider notified

## 2023-03-28 NOTE — Telephone Encounter (Signed)
 Inbound call from patient requesting a call to discuss CT scan results. She states it mentions having a blockage and is wanting to know if that means she needs surgery. Patient states she also sent a mychart message. Please advise.

## 2023-03-29 ENCOUNTER — Other Ambulatory Visit: Payer: Self-pay

## 2023-03-29 DIAGNOSIS — R197 Diarrhea, unspecified: Secondary | ICD-10-CM

## 2023-03-29 DIAGNOSIS — R634 Abnormal weight loss: Secondary | ICD-10-CM

## 2023-03-29 DIAGNOSIS — G8929 Other chronic pain: Secondary | ICD-10-CM

## 2023-03-29 MED ORDER — RIFAXIMIN 550 MG PO TABS: 550.0000 mg | ORAL_TABLET | Freq: Three times a day (TID) | ORAL | 0 refills | Status: AC

## 2023-04-02 ENCOUNTER — Other Ambulatory Visit

## 2023-04-05 ENCOUNTER — Ambulatory Visit: Payer: 59 | Admitting: Physician Assistant

## 2023-04-09 ENCOUNTER — Other Ambulatory Visit: Payer: Self-pay | Admitting: Family Medicine

## 2023-04-12 IMAGING — CT CT ANGIO NECK
1 of 8 series · 13 of 46 positions shown, 18 images · IV contrast (omnipaque)
Comparison: CTA head and neck 11/16/2020.  Head MRA 12/02/2020.

CLINICAL DATA: Neuro deficit, acute, stroke suspected recent
stenting of aneurysm; Neuro deficit, acute, stroke suspected.
Left-sided weakness with tingling in the arm and leg. Visual deficit
with blurry vision following treatment of an aneurysm last month.



[Series 6: thin · axial · 0.56mm/px · z∈[-364,-76]mm · 13 of 659 slices shown, 18 images]
[im 42/659  soft-tissue]
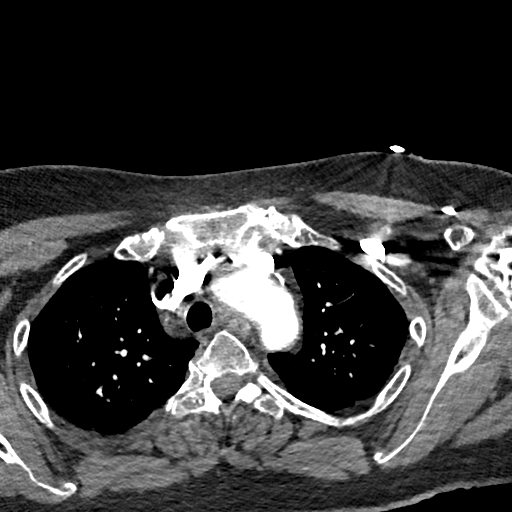
[im 42/659  bone]
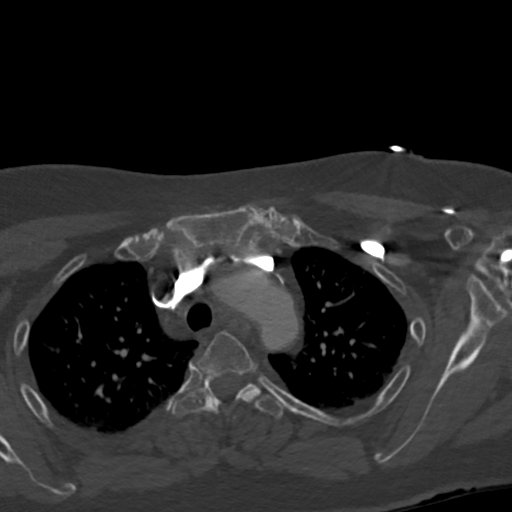
[im 83/659  soft-tissue]
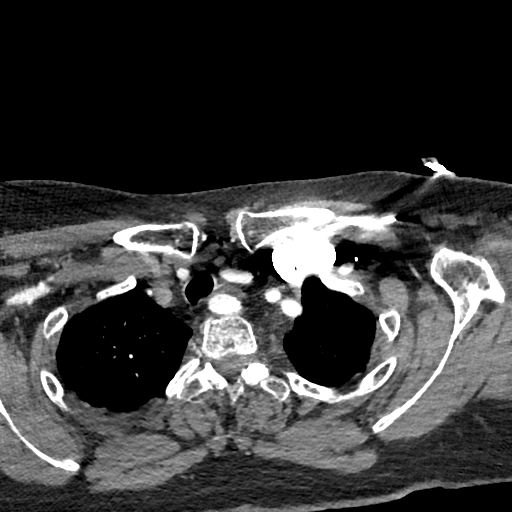
[im 165/659  soft-tissue]
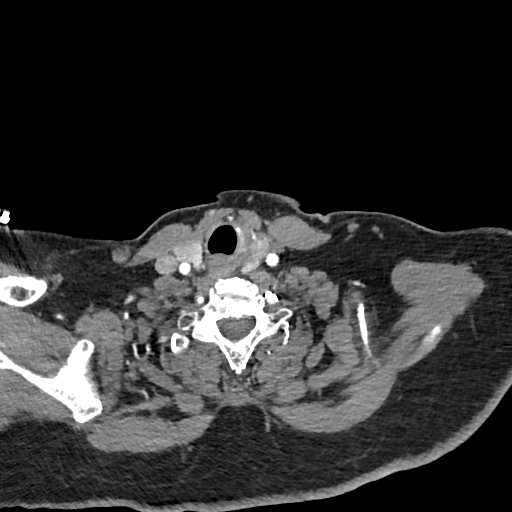
[im 206/659  soft-tissue]
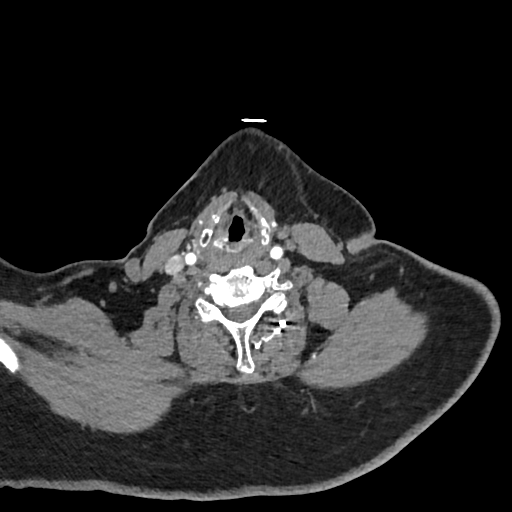
[im 247/659  soft-tissue]
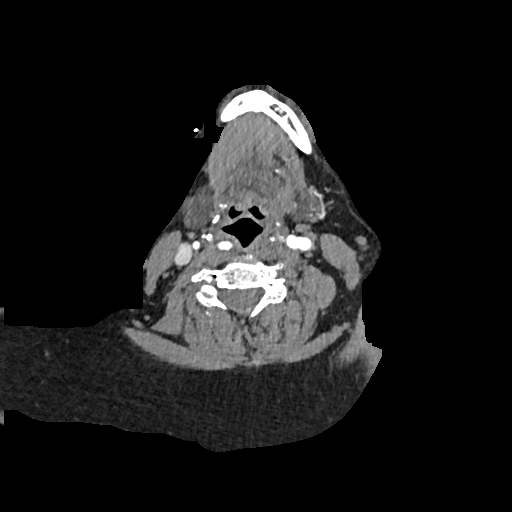
[im 288/659  soft-tissue]
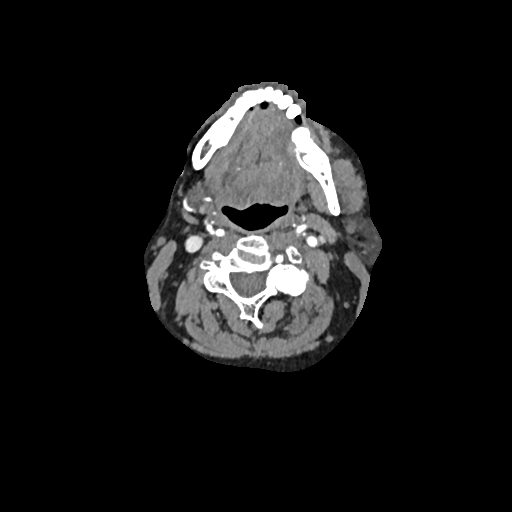
[im 371/659  soft-tissue]
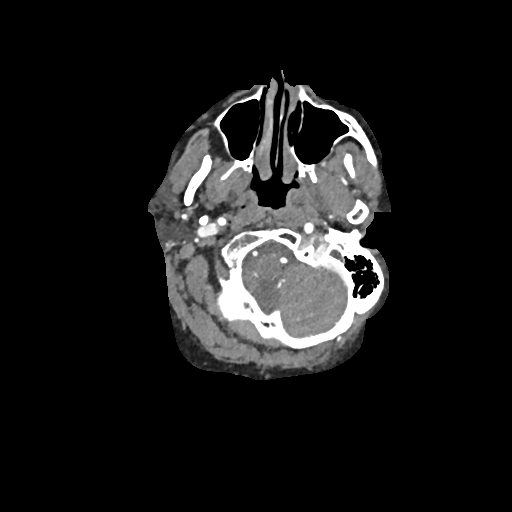
[im 412/659  soft-tissue]
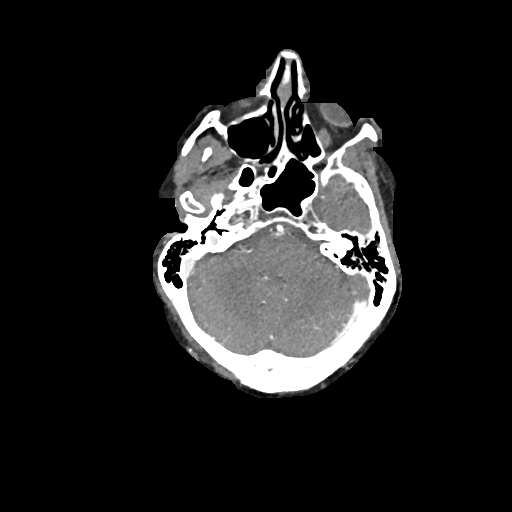
[im 453/659  soft-tissue]
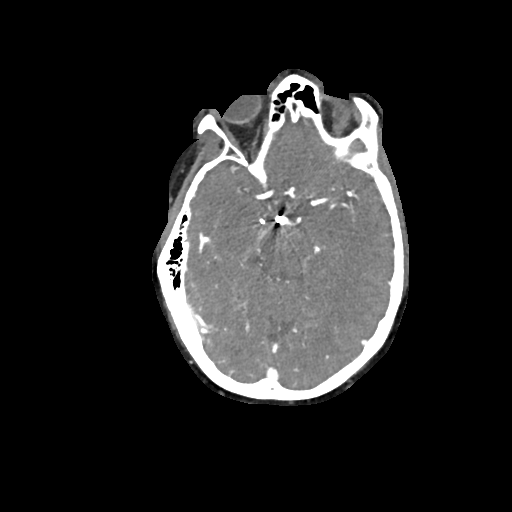
[im 453/659  bone]
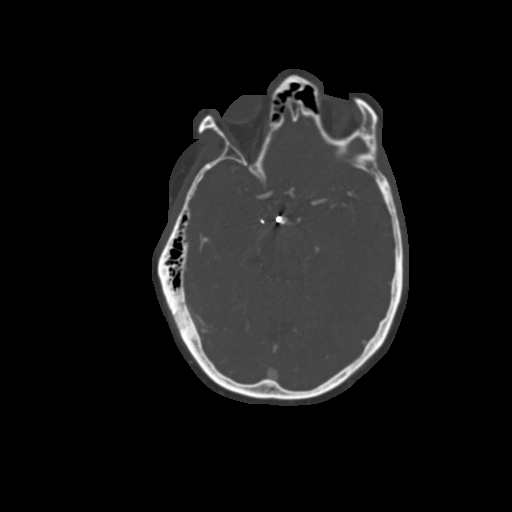
[im 494/659  soft-tissue]
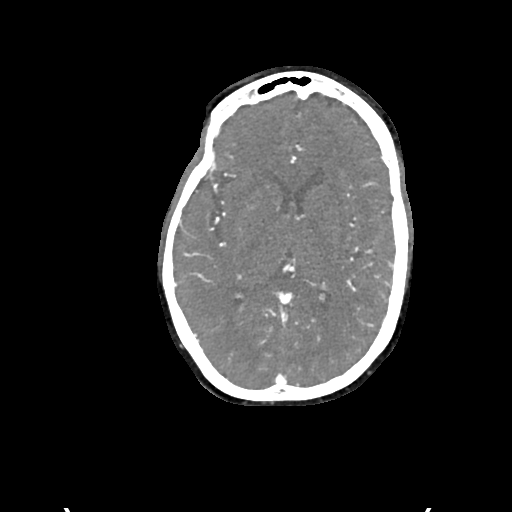
[im 494/659  lung]
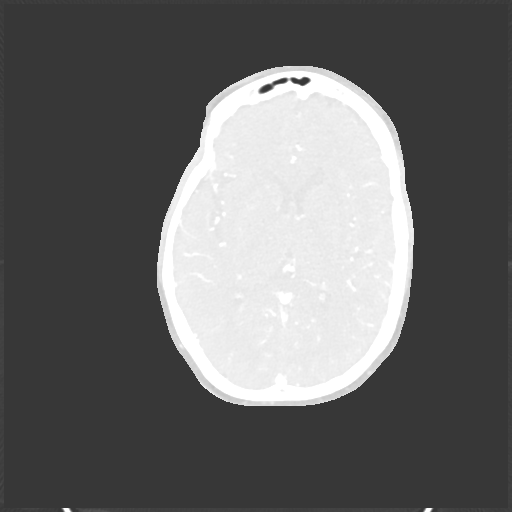
[im 535/659  lung]
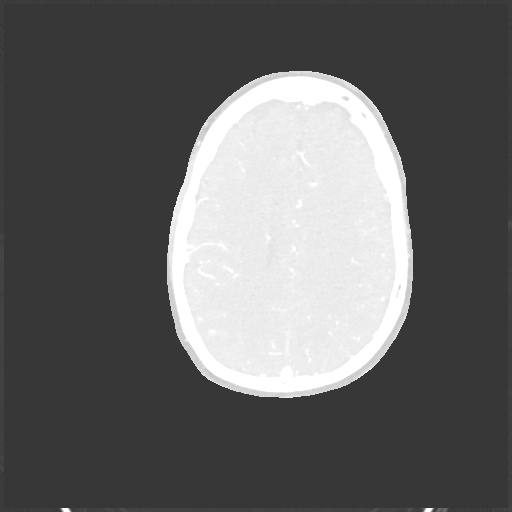
[im 576/659  soft-tissue]
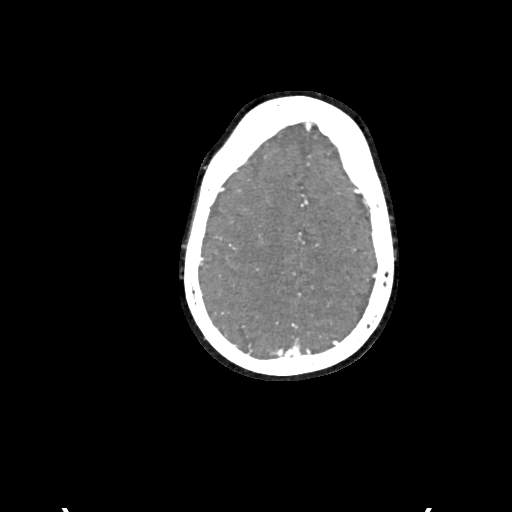
[im 576/659  lung]
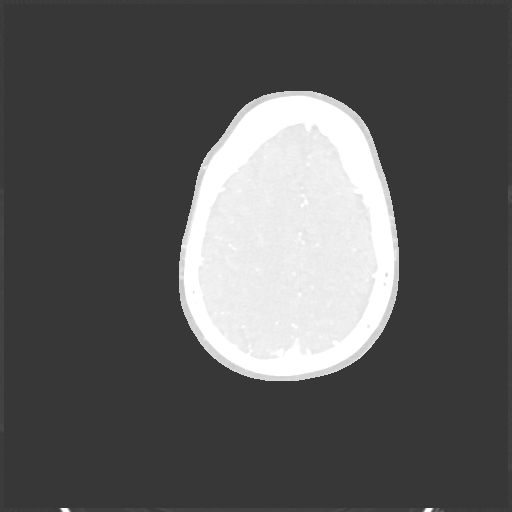
[im 617/659  soft-tissue]
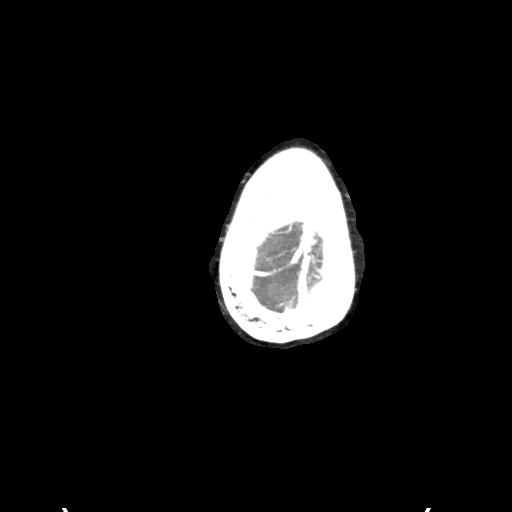
[im 617/659  lung]
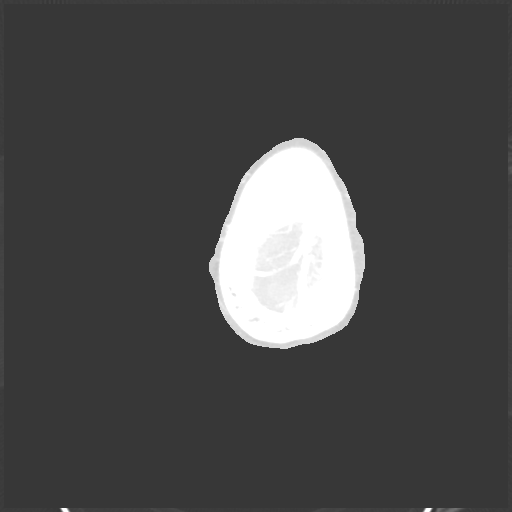

[13 of 46 positions shown; findings below may reference images not displayed]

FINDINGS: CTA NECK FINDINGS

Aortic arch: Normal variant aortic arch branching pattern with
aberrant right subclavian artery coursing posterior to the esophagus
and trachea. Widely patent arch vessel origins.

Right carotid system: Patent without evidence of stenosis,
dissection, or significant atherosclerosis.

Left carotid system: Patent with minimal atherosclerotic plaque at
the carotid bifurcation. No evidence of stenosis or dissection.

Vertebral arteries: Patent without evidence of stenosis, dissection,
or significant atherosclerosis. Mildly dominant left vertebral
artery.

Skeleton: Moderate mid to lower cervical disc degeneration.
Asymmetrically advanced left facet arthrosis at C2-3.

Other neck: No evidence of cervical lymphadenopathy or mass.

Upper chest: No apical lung consolidation or mass.

Review of the MIP images confirms the above findings

CTA HEAD FINDINGS

Anterior circulation: The internal carotid arteries are widely
patent from skull base to carotid termini. ACAs and MCAs are patent
without evidence of a proximal branch occlusion or flow limiting
proximal stenosis although streak artifact obscures portions of the
right A1 and proximal A2 segments. No aneurysm is identified.

Posterior circulation: The intracranial vertebral arteries are
widely patent to the basilar. Patent PICA and SCA origins are seen
bilaterally. The basilar artery is widely patent with a stent noted
in the distal basilar artery extending into the right PCA. There has
been coiling of a basilar tip aneurysm with prominent associated
streak artifact. No residual aneurysm filling is identified. Both
PCAs are grossly patent although the P1 segments are poorly
evaluated due to streak artifact.

Venous sinuses: Patent.

Anatomic variants: None.

Review of the MIP images confirms the above findings
IMPRESSION: 1. No large vessel occlusion.
2. Basilar tip aneurysm coiling without evidence of recurrent
aneurysm filling.
3. Suboptimal assessment of the proximal PCAs and right ACA due to
streak artifact.
4. Widely patent cervical carotid and vertebral arteries.

## 2023-04-12 IMAGING — CT CT HEAD CODE STROKE
3 of 5 series · 14 of 47 positions shown, 16 images · non-contrast
Comparison: Head MRI 12/02/2020

CLINICAL DATA: Code stroke. Neuro deficit, acute, stroke suspected.
Left-sided weakness with tingling in the arm and leg. Visual deficit
with blurry vision following treatment of an aneurysm last month.

EXAM:
CT HEAD WITHOUT CONTRAST
TECHNIQUE: Contiguous axial images were obtained from the base of the skull
through the vertex without intravenous contrast.

[Series 2: head 5.0 st · axial · 0.48mm/px · z∈[-107,+18]mm · 8 of 33 slices shown, 10 images]
[im 4/33  brain]
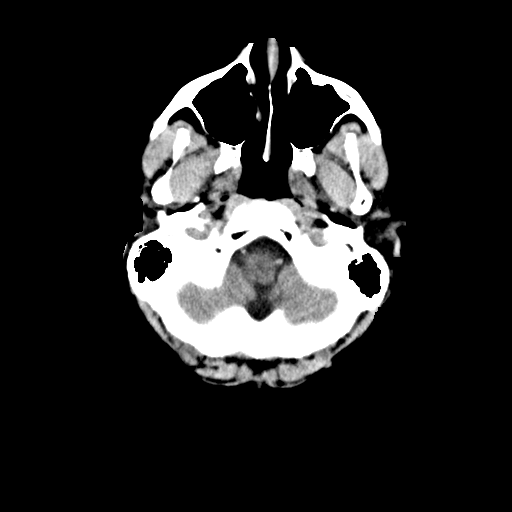
[im 4/33  bone]
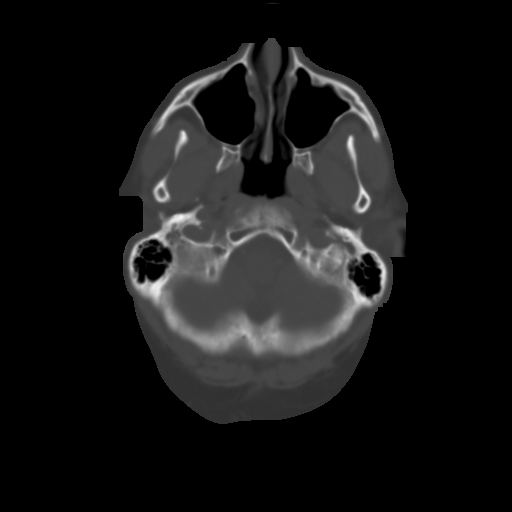
[im 8/33  brain]
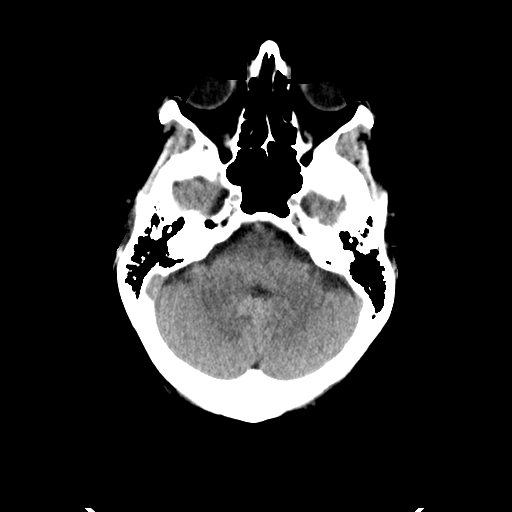
[im 11/33  brain]
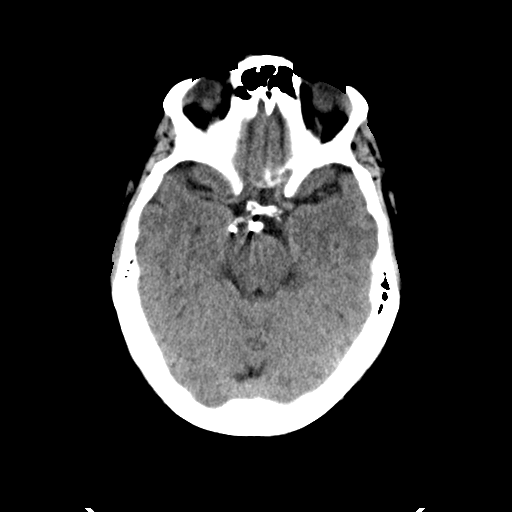
[im 15/33  brain]
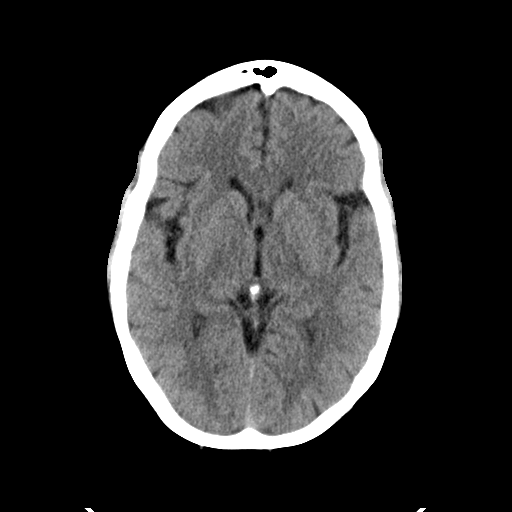
[im 18/33  brain]
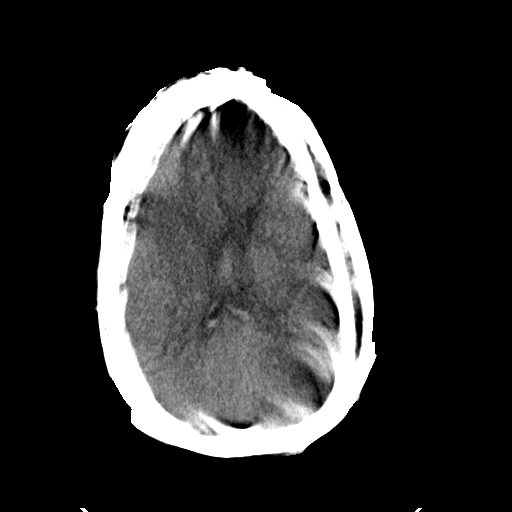
[im 18/33  bone]
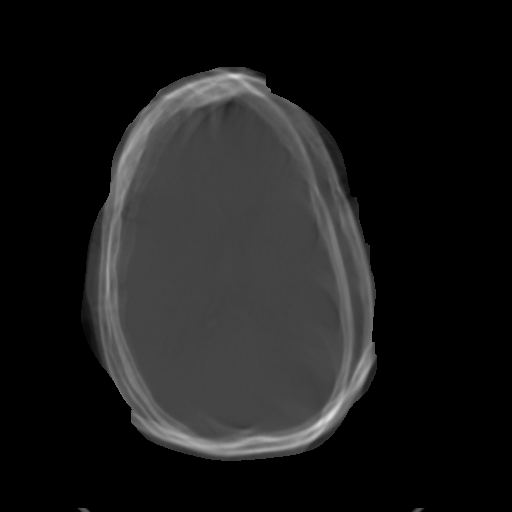
[im 22/33  brain]
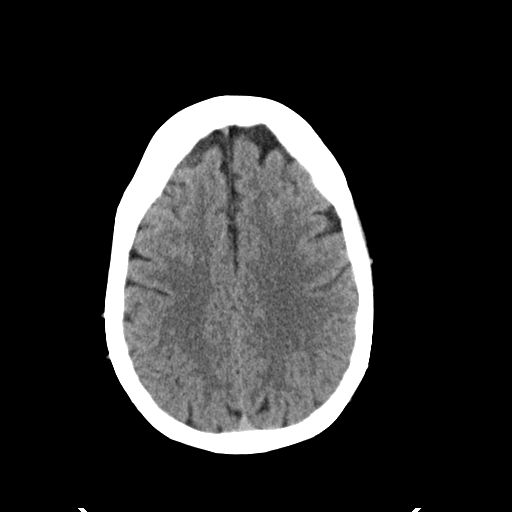
[im 25/33  brain]
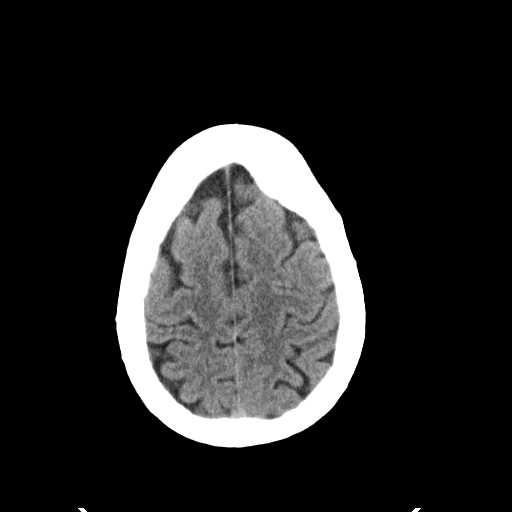
[im 29/33  brain]
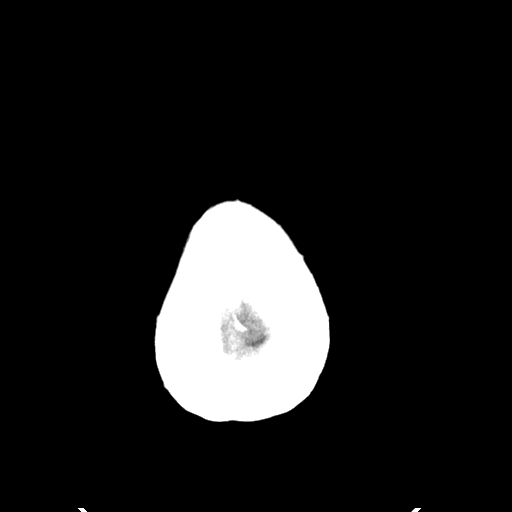

[Series 7: head 3.0 cor st · coronal · 0.31mm/px · 3 of 62 slices shown]
[im 21/62  brain]
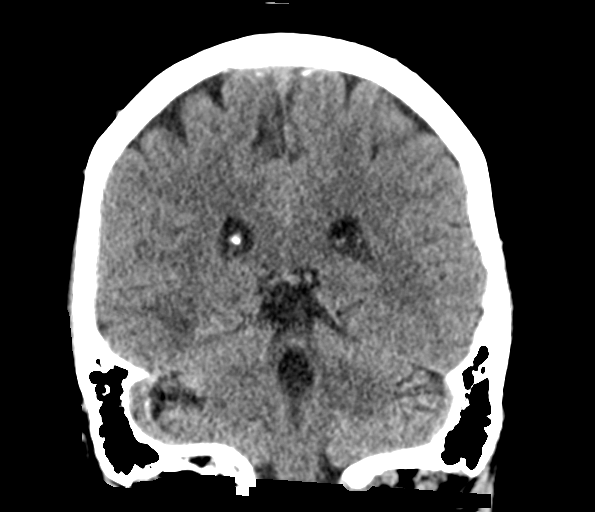
[im 28/62  brain]
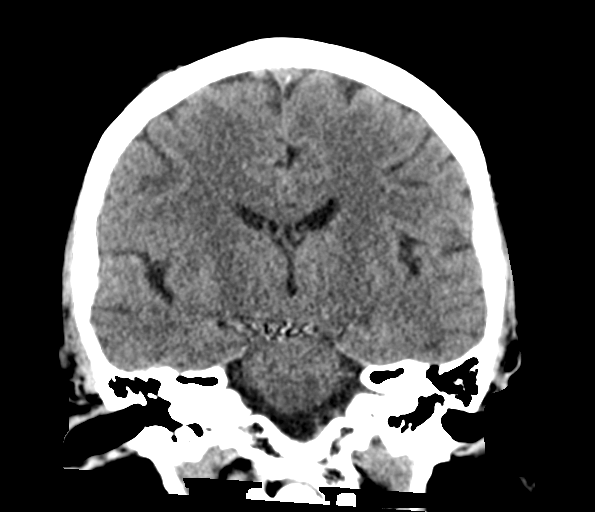
[im 34/62  brain]
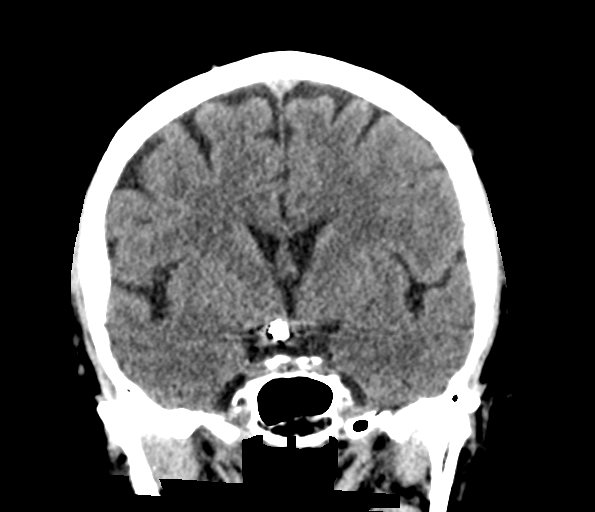

[Series 8: head 3.0 sag st · sagittal · 0.31mm/px · 3 of 50 slices shown]
[im 17/50  brain]
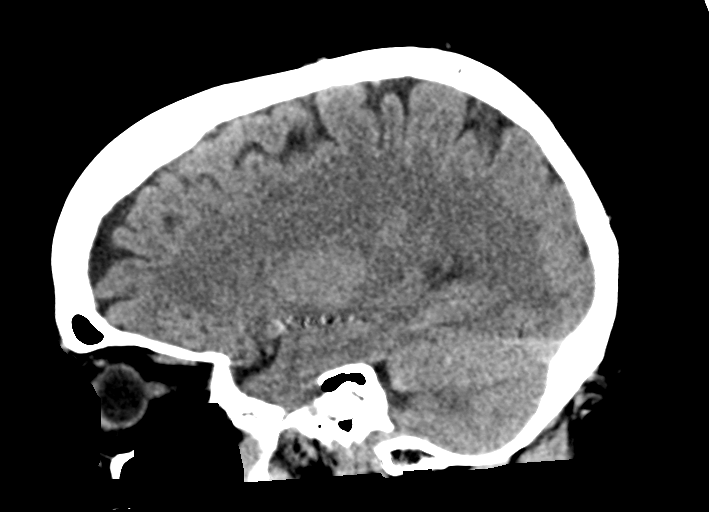
[im 25/50  brain]
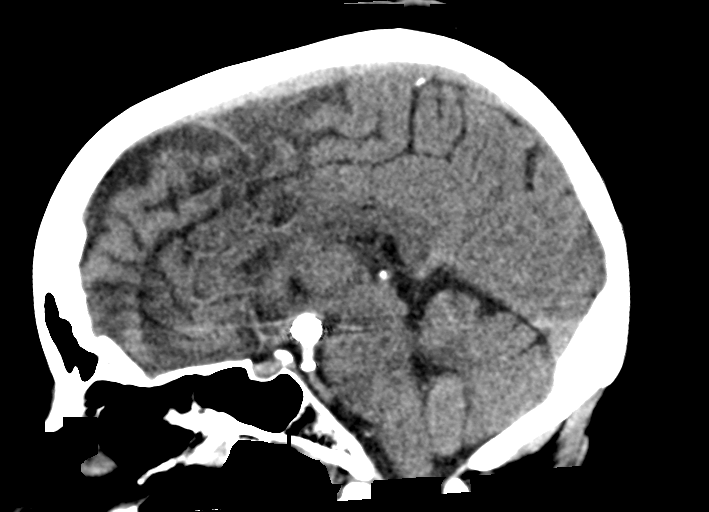
[im 33/50  brain]
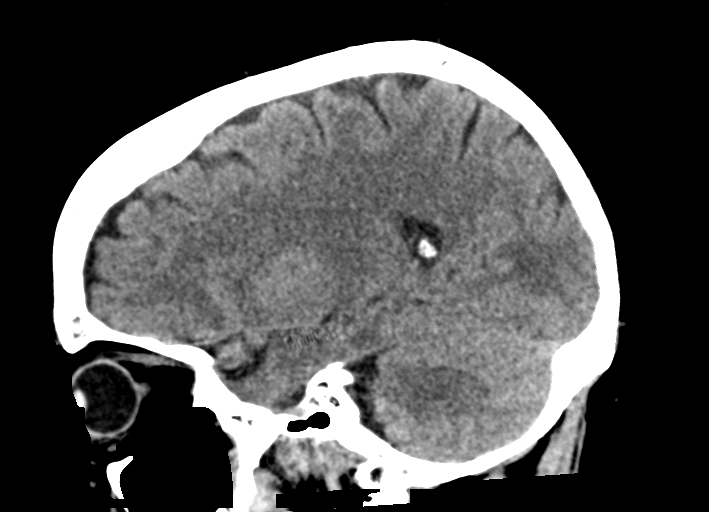

[14 of 47 positions shown; findings below may reference images not displayed]

FINDINGS: Brain: There is no evidence of an acute large territory infarct,
intracranial hemorrhage, mass, midline shift, or extra-axial fluid
collection. There is slight hypoattenuation in the right occipital
lobe in the region of the small acute to subacute infarcts on the
recent prior MRI. The ventricles and sulci are normal.

Vascular: Stent assisted coiling of a basilar aneurysm. No gross
hyperdense vessel.

Skull: No fracture or suspicious osseous lesion.

Sinuses/Orbits: Visualized paranasal sinuses and mastoid air cells
are clear. Unremarkable orbits.

Other: None.

ASPECTS (Alberta Stroke Program Early CT Score)

- Ganglionic level infarction (caudate, lentiform nuclei, internal
capsule, insula, M1-M3 cortex): 7

- Supraganglionic infarction (M4-M6 cortex): 3

Total score (0-10 with 10 being normal): 10
IMPRESSION: 1. No intracranial hemorrhage or definite new infarct. ASPECTS of
10.
2. Small subacute right occipital infarcts.

These results were communicated to Dr. Kurian at [DATE] on 12/04/2020
by text page via the [employer] messaging system.

## 2023-04-17 ENCOUNTER — Telehealth: Payer: Self-pay | Admitting: Gastroenterology

## 2023-04-17 NOTE — Telephone Encounter (Signed)
 Spoke with the patient. She did not return the specimen within the time allotted after collection. Per the lab, she may return to them for new containers. They do not need a new order put in.

## 2023-04-17 NOTE — Telephone Encounter (Signed)
 Patient called and stated that she cancelled her appointment due to her messing up her stool sample. Patient stated she is another stool sample and is wanting to finish her antibiotic before she decides to make another appointment. Please advise.

## 2023-04-24 ENCOUNTER — Ambulatory Visit: Payer: 59 | Admitting: Gastroenterology

## 2023-04-26 ENCOUNTER — Other Ambulatory Visit

## 2023-05-11 ENCOUNTER — Encounter (HOSPITAL_COMMUNITY): Payer: Self-pay

## 2023-05-11 ENCOUNTER — Emergency Department (HOSPITAL_COMMUNITY)

## 2023-05-11 ENCOUNTER — Emergency Department (HOSPITAL_COMMUNITY)
Admission: EM | Admit: 2023-05-11 | Discharge: 2023-05-12 | Disposition: A | Discharge status: Home / Self Care | Attending: Emergency Medicine | Admitting: Emergency Medicine

## 2023-05-11 ENCOUNTER — Other Ambulatory Visit: Payer: Self-pay

## 2023-05-11 DIAGNOSIS — R519 Headache, unspecified: Secondary | ICD-10-CM | POA: Diagnosis not present

## 2023-05-11 DIAGNOSIS — R569 Unspecified convulsions: Secondary | ICD-10-CM | POA: Diagnosis present

## 2023-05-11 DIAGNOSIS — R253 Fasciculation: Secondary | ICD-10-CM | POA: Insufficient documentation

## 2023-05-11 LAB — COMPREHENSIVE METABOLIC PANEL WITH GFR
ALT: 18 U/L (ref 0–44)
AST: 19 U/L (ref 15–41)
Albumin: 3.6 g/dL (ref 3.5–5.0)
Alkaline Phosphatase: 60 U/L (ref 38–126)
Anion gap: 9 (ref 5–15)
BUN: 10 mg/dL (ref 6–20)
CO2: 23 mmol/L (ref 22–32)
Calcium: 9 mg/dL (ref 8.9–10.3)
Chloride: 109 mmol/L (ref 98–111)
Creatinine, Ser: 0.96 mg/dL (ref 0.44–1.00)
GFR, Estimated: >60 mL/min
Glucose, Bld: 108 mg/dL — ABNORMAL HIGH (ref 70–99)
Potassium: 3.7 mmol/L (ref 3.5–5.1)
Sodium: 141 mmol/L (ref 135–145)
Total Bilirubin: 0.5 mg/dL (ref 0.0–1.2)
Total Protein: 6.3 g/dL — ABNORMAL LOW (ref 6.5–8.1)

## 2023-05-11 LAB — CBC WITH DIFFERENTIAL/PLATELET
Abs Immature Granulocytes: 0.01 10*3/uL (ref 0.00–0.07)
Basophils Absolute: 0.1 10*3/uL (ref 0.0–0.1)
Basophils Relative: 1 %
Eosinophils Absolute: 0.5 10*3/uL (ref 0.0–0.5)
Eosinophils Relative: 9 %
HCT: 39.4 % (ref 36.0–46.0)
Hemoglobin: 13.3 g/dL (ref 12.0–15.0)
Immature Granulocytes: 0 %
Lymphocytes Relative: 33 %
Lymphs Abs: 2 10*3/uL (ref 0.7–4.0)
MCH: 30.4 pg (ref 26.0–34.0)
MCHC: 33.8 g/dL (ref 30.0–36.0)
MCV: 90 fL (ref 80.0–100.0)
Monocytes Absolute: 0.6 10*3/uL (ref 0.1–1.0)
Monocytes Relative: 10 %
Neutro Abs: 2.8 10*3/uL (ref 1.7–7.7)
Neutrophils Relative %: 47 %
Platelets: 226 10*3/uL (ref 150–400)
RBC: 4.38 MIL/uL (ref 3.87–5.11)
RDW: 12.7 % (ref 11.5–15.5)
WBC: 6 10*3/uL (ref 4.0–10.5)
nRBC: 0 % (ref 0.0–0.2)

## 2023-05-11 LAB — I-STAT CHEM 8, ED
BUN: 11 mg/dL (ref 6–20)
Calcium, Ion: 1.16 mmol/L (ref 1.15–1.40)
Chloride: 107 mmol/L (ref 98–111)
Creatinine, Ser: 1 mg/dL (ref 0.44–1.00)
Glucose, Bld: 109 mg/dL — ABNORMAL HIGH (ref 70–99)
HCT: 39 % (ref 36.0–46.0)
Hemoglobin: 13.3 g/dL (ref 12.0–15.0)
Potassium: 3.7 mmol/L (ref 3.5–5.1)
Sodium: 142 mmol/L (ref 135–145)
TCO2: 24 mmol/L (ref 22–32)

## 2023-05-11 LAB — MAGNESIUM: Magnesium: 2 mg/dL (ref 1.7–2.4)

## 2023-05-11 LAB — CBG MONITORING, ED: Glucose-Capillary: 110 mg/dL — ABNORMAL HIGH (ref 70–99)

## 2023-05-11 MED ORDER — IOHEXOL 350 MG/ML SOLN
75.0000 mL | Freq: Once | INTRAVENOUS | Status: AC | PRN
  Administered 2023-05-11: 75 mL via INTRAVENOUS

## 2023-05-11 NOTE — ED Provider Notes (Signed)
 [facility] Emergency Department Provider Note MRN:  [id number]  Arrival date & time: 05/12/23     Chief Complaint   possible seizure   History of Present Illness   Tiffany Horn is a 63 y.o. year-old female presents to the ED with chief complaint of headache.  Hx of coiled aneurysm about 3 years ago.  She reports this morning she had right eye twitching and then either passed out or had a seizure that was unwitnessed.  She defecated on herself.  She denies hx of seizures.  She states that she has had persistent 6/10 headache today.  She called her neurologist this evening, who told her to come be evaluated.  She denies slurred speech of vision changes.  She denies any numbness, weakness, or tingling.  She complains only of headache right now.  History provided by patient.   Review of Systems  Pertinent positive and negative review of systems noted in HPI.    Physical Exam   Vitals:   05/12/23 0100 05/12/23 0115  BP: 132/87 (!) 119/90  Pulse: 74 75  Resp: 18 20  Temp:    SpO2: 95% 96%    CONSTITUTIONAL:  well-appearing, NAD NEURO:  Alert and oriented x 3, CN 3-12 grossly intact, normal finger to nose, no ataxia EYES:  eyes equal and reactive ENT/NECK:  Supple, no stridor  CARDIO:  normal rate, regular rhythm, appears well-perfused  PULM:  No respiratory distress, CTAB GI/GU:  non-distended,  MSK/SPINE:  No gross deformities, no edema, moves all extremities  SKIN:  no rash, atraumatic   *Additional and/or pertinent findings included in MDM below  Diagnostic and Interventional Summary    EKG Interpretation Date/Time:  Friday May 11 2023 22:01:51 EDT Ventricular Rate:  77 PR Interval:  145 QRS Duration:  82 QT Interval:  408 QTC Calculation: 462 R Axis:   25  Text Interpretation: Sinus rhythm Low voltage, precordial leads Abnormal R-wave progression, early transition No significant change since last tracing Confirmed by Florette Hurry  [phone]) on 05/11/2023 10:12:17 PM       Labs Reviewed  COMPREHENSIVE METABOLIC PANEL WITH GFR - Abnormal; Notable for the following components:      Result Value   Glucose, Bld 108 (*)    Total Protein 6.3 (*)    All other components within normal limits  CBG MONITORING, ED - Abnormal; Notable for the following components:   Glucose-Capillary 110 (*)    All other components within normal limits  I-STAT CHEM 8, ED - Abnormal; Notable for the following components:   Glucose, Bld 109 (*)    All other components within normal limits  CBC WITH DIFFERENTIAL/PLATELET  MAGNESIUM    CT ANGIO HEAD NECK W WO CM  Final Result      Medications  iohexol (OMNIPAQUE) 350 MG/ML injection 75 mL (75 mLs Intravenous Contrast Given 05/11/23 2253)  ketorolac (TORADOL) 30 MG/ML injection 30 mg (30 mg Intravenous Given 05/12/23 0119)  metoCLOPramide (REGLAN) injection 10 mg (10 mg Intravenous Given 05/12/23 0120)  diphenhydrAMINE (BENADRYL) injection 25 mg (25 mg Intravenous Given 05/12/23 0119)     Procedures  /  Critical Care Procedures  ED Course and Medical Decision Making  I have reviewed the triage vital signs, the nursing notes, and pertinent available records from the EMR.  Social Determinants Affecting Complexity of Care: Patient has no clinically significant social determinants affecting this chief complaint..   ED Course: Clinical Course as of 05/12/23 0224  Sat  May 12, 2023  0224 Magnesium [RB]  0224 Comprehensive metabolic panel(!) No significant electrolyte abnormality  [RB]  0224 CBC with Differential No anemia or leukocytosis. [RB]    Clinical Course User Index [RB] Sherel Dikes, PA-C    Medical Decision Making Patient here with headache.  She has history of prior aneurysm that required coiling.  She states that she either had a syncopal event or seizure this morning.  States that she had an episode of incontinence with diarrhea.  She states that she feels well now with  the exception of a headache.  States that she called her neurologist and was advised to come to the emergency department for evaluation.  CT head and CT angio head and neck were ordered for evaluation.  These test resulted reassuringly.  Patient's headache was treated with headache cocktail.  She reports significant improvement in her symptoms.  She feels ready to go home.  We discussed that we cannot know for certain if she had a seizure or not or what might of caused her to pass out.  I have printed her driving restrictions per [place] law.  I have advised her to follow-up with her neurologist.  Amount and/or Complexity of Data Reviewed Labs: ordered. Decision-making details documented in ED Course.    Details: Reassuring labs Radiology: ordered and independent interpretation performed.    Details: No obvious bleed  Risk Prescription drug management.         Consultants: No consultations were needed in caring for this patient.   Treatment and Plan: I considered admission due to patient's initial presentation, but after considering the examination and diagnostic results, patient will not require admission and can be discharged with outpatient follow-up.    Final Clinical Impressions(s) / ED Diagnoses     ICD-10-CM   1. Nonintractable headache, unspecified chronicity pattern, unspecified headache type  R51.9     2. Seizure-like activity (HCC)  R56.9       ED Discharge Orders     None         Discharge Instructions Discussed with and Provided to Patient:     Discharge Instructions      Your CT scans looked good.  Your symptoms might have been due to migraine.  However, I recommend that you follow-up with a neurologist in case this was a seizure.  Per [place] statutes, patients with seizures are not allowed to drive until  they have been seizure-free for six months. Use caution when using heavy equipment or power tools. Avoid working on ladders or at heights.  Take showers instead of baths. Ensure the water temperature is not too high on the home water heater. Do not go swimming alone. When caring for infants or small children, sit down when holding, feeding, or changing them to minimize risk of injury to the child in the event you have a seizure.   Also, Maintain good sleep hygiene. Avoid alcohol.        Sherel Dikes, PA-C 05/12/23 0226    Dalene Duck, MD 05/12/23 [phone]

## 2023-05-11 NOTE — ED Notes (Signed)
 Patient transported to CT via stretcher.

## 2023-05-11 NOTE — ED Triage Notes (Signed)
 Pt BIB [facility] from home d/t possible seizure.  Pt stated she was sitting on her chair around 1130 this morning and noticed eye twitching and when she woke up (from LOC) she had fecal incontinence.  Pt has hx of migraines and headache now feels similar.  Pt has a coil d/t aneurysm and gets scanned every year.  Pt called her neurologist at 1930 - he advised to come here.

## 2023-05-12 MED ORDER — KETOROLAC TROMETHAMINE 30 MG/ML IJ SOLN
30.0000 mg | Freq: Once | INTRAMUSCULAR | Status: AC
  Administered 2023-05-12: 30 mg via INTRAVENOUS
  Filled 2023-05-12: qty 1

## 2023-05-12 MED ORDER — METOCLOPRAMIDE HCL 5 MG/ML IJ SOLN
10.0000 mg | Freq: Once | INTRAMUSCULAR | Status: AC
  Administered 2023-05-12: 10 mg via INTRAVENOUS
  Filled 2023-05-12: qty 2

## 2023-05-12 MED ORDER — DIPHENHYDRAMINE HCL 50 MG/ML IJ SOLN
25.0000 mg | Freq: Once | INTRAMUSCULAR | Status: AC
  Administered 2023-05-12: 25 mg via INTRAVENOUS
  Filled 2023-05-12: qty 1

## 2023-05-12 NOTE — Discharge Instructions (Signed)
 Your CT scans looked good.  Your symptoms might have been due to migraine.  However, I recommend that you follow-up with a neurologist in case this was a seizure.  Per [place]  [place] statutes, patients with seizures are not allowed to drive until  they have been seizure-free for six months. Use caution when using heavy equipment or power tools. Avoid working on ladders or at heights. Take showers instead of baths. Ensure the water temperature is not too high on the home water heater. Do not go swimming alone. When caring for infants or small children, sit down when holding, feeding, or changing them to minimize risk of injury to the child in the event you have a seizure.   Also, Maintain good sleep hygiene. Avoid alcohol.

## 2023-05-28 ENCOUNTER — Ambulatory Visit (INDEPENDENT_AMBULATORY_CARE_PROVIDER_SITE_OTHER)

## 2023-05-28 DIAGNOSIS — E538 Deficiency of other specified B group vitamins: Secondary | ICD-10-CM

## 2023-05-28 MED ORDER — CYANOCOBALAMIN 1000 MCG/ML IJ SOLN
1000.0000 ug | Freq: Once | INTRAMUSCULAR | Status: AC
  Administered 2023-05-28: 1000 ug via INTRAMUSCULAR

## 2023-05-28 NOTE — Progress Notes (Signed)
 Pt here for monthly B12 injection per Dr.Kuneff  B12 1000mcg given IM right deltoid, and pt tolerated injection well.  Next B12 injection scheduled for N/A, pt has not scheduled yet.

## 2023-06-12 ENCOUNTER — Ambulatory Visit: Payer: Self-pay

## 2023-06-12 ENCOUNTER — Encounter: Payer: Self-pay | Admitting: Family Medicine

## 2023-06-12 ENCOUNTER — Telehealth (INDEPENDENT_AMBULATORY_CARE_PROVIDER_SITE_OTHER): Admitting: Family Medicine

## 2023-06-12 DIAGNOSIS — J209 Acute bronchitis, unspecified: Secondary | ICD-10-CM | POA: Diagnosis not present

## 2023-06-12 MED ORDER — PREDNISONE 20 MG PO TABS: 20.0000 mg | ORAL_TABLET | Freq: Every day | ORAL | 0 refills | Status: DC

## 2023-06-12 NOTE — Telephone Encounter (Signed)
 Chief Complaint: Productive cough Symptoms: hoarse, nasal congestion Frequency: x 3 days Pertinent Negatives: Patient denies fever, CP Disposition: [] ED /[x] Urgent Care (no appt availability in office) / [] Appointment(In office/virtual)/ []  [facility] [facility]/ [] Home Care/ [] Refused Recommended Disposition /[] [place] [facility]/ []  Follow-up with PCP Additional Notes: Pt reports she has been experiencing productive cough with light green sputum, nasal congestion, SOB with exertion, hoarseness x 3 days. Denies fever, CP, chest congestion. OV unavailable today, verified with CAL, pt declines tomorrow as she has to work. Pt states she is not comfortable going to UC due to history of treatment being ineffective. Pt notes she is concerned as she usually develops pneumonia quickly. Requests message sent to provider for script or VV. This RN educated pt on home care, new-worsening symptoms, when to call back/seek emergent care. Pt verbalized understanding and agrees to plan.    Copied from CRM [id number]. Topic: Clinical - Red Word Triage >> Jun 12, 2023  9:49 AM Allyne Areola wrote: Red Word that prompted transfer to Nurse Triage: Patient is calling because she believes she may have bronchitis, she is concerned because she is a high risk patient usually when she has bronchitis it develops into pneumonia. Reason for Disposition  [1] MILD difficulty breathing (e.g., minimal/no SOB at rest, SOB with walking, pulse <100) AND [2] still present when not coughing  Answer Assessment - Initial Assessment Questions 1. ONSET: "When did the cough begin?"      X 3 days 3. SPUTUM: "Describe the color of your sputum" (none, dry cough; clear, white, yellow, green)     Light green 4. HEMOPTYSIS: "Are you coughing up any blood?" If so ask: "How much?" (flecks, streaks, tablespoons, etc.)     None 5. DIFFICULTY BREATHING: "Are you having difficulty breathing?" If Yes, ask: "How bad is it?" (e.g., mild, moderate,  severe)    - MILD: No SOB at rest, mild SOB with walking, speaks normally in sentences, can lie down, no retractions, pulse < 100.    - MODERATE: SOB at rest, SOB with minimal exertion and prefers to sit, cannot lie down flat, speaks in phrases, mild retractions, audible wheezing, pulse 100-120.    - SEVERE: Very SOB at rest, speaks in single words, struggling to breathe, sitting hunched forward, retractions, pulse > 120      Mild SOB with exertion 6. FEVER: "Do you have a fever?" If Yes, ask: "What is your temperature, how was it measured, and when did it start?"     None 8. LUNG HISTORY: "Do you have any history of lung disease?"  (e.g., pulmonary embolus, asthma, emphysema)     Asthma 10. OTHER SYMPTOMS: "Do you have any other symptoms?" (e.g., runny nose, wheezing, chest pain)       Hoarse, fatigue,  Protocols used: Cough - Acute Productive-A-AH

## 2023-06-12 NOTE — Progress Notes (Signed)
 Virtual Visit via Video Note  I connected with Harveen  on 06/12/23 at 11:00 AM EDT by a video enabled telemedicine application and verified that I am speaking with the correct person using two identifiers.  Location patient: [place] Location provider:work or home office Persons participating in the virtual visit: patient, provider  I discussed the limitations and requested verbal permission for telemedicine visit. The patient expressed understanding and agreed to proceed.  HPI: 63 year old female being seen today for cough. Onset 2 days ago.  Feels some mucus in the back of her throat and also has a hoarse voice.  No headache, sinus pressure or nasal drainage. Very tired, missed work for a day.  Denies wheezing but feels like her chest is getting a little bit tight. No chest pain or shortness of breath.  No fever.  +hx bronchitis with RAD, breztri  on med list but patient states she has not been on this is quite a while.  Review of systems: No nausea, vomiting, rash, headache, or dizziness    ROS: See pertinent positives and negatives per HPI.  Past Medical History:  Diagnosis Date   Allergy     Anxiety    Arthritis    knees and spine, shoulder   Asthma    Bronchitis    hx - recurrent   Complication of anesthesia    waking up is not easy   Depression    Elevated IgE level 09/12/2017   09/12/2017 IgE 195   GERD (gastroesophageal reflux disease)    History of COVID-19 12/30/2020   Hx of irritable bowel syndrome    x2   Hyperlipidemia    diet controlled - no medication   Hypertension    not taking any meds at present - under control per patient   Hypokalemia    with PNA admission (2.5)   Hypothyroidism (acquired)    Ischemic cerebrovascular accident (CVA) (HCC)    Migraines    Osteoporosis    Pneumonia    4 episodes; hosp. admission 2014   PONV (postoperative nausea and vomiting)    TIA (transient ischemic attack) 12/17/2020   UC (ulcerative colitis) [facility])    DX'D 2021     Past Surgical History:  Procedure Laterality Date   ANEURYSM COILING     STENT   BREAST SURGERY     implants, then had them removed   COLONOSCOPY     greater 10 yrs ago - ? [facility]-2017 LAST   DILATION AND CURETTAGE OF UTERUS     IR ANGIO INTRA EXTRACRAN SEL INTERNAL CAROTID BILAT MOD SED  11/19/2020   IR ANGIO VERTEBRAL SEL VERTEBRAL BILAT MOD SED  11/19/2020   IR ANGIO VERTEBRAL SEL VERTEBRAL UNI L MOD SED  02/25/2021   IR ANGIO VERTEBRAL SEL VERTEBRAL UNI L MOD SED  01/13/2022   IR ANGIO VERTEBRAL SEL VERTEBRAL UNI L MOD SED  12/22/2022   IR ANGIOGRAM FOLLOW UP STUDY  11/19/2020   IR ANGIOGRAM FOLLOW UP STUDY  11/19/2020   IR ANGIOGRAM FOLLOW UP STUDY  11/19/2020   IR ANGIOGRAM FOLLOW UP STUDY  11/19/2020   IR ANGIOGRAM FOLLOW UP STUDY  11/19/2020   IR ANGIOGRAM FOLLOW UP STUDY  11/19/2020   IR ANGIOGRAM FOLLOW UP STUDY  11/19/2020   IR ANGIOGRAM FOLLOW UP STUDY  11/19/2020   IR INTRA CRAN STENT  11/19/2020   IR TRANSCATH/EMBOLIZ  11/19/2020   LAPAROSCOPIC ABDOMINAL EXPLORATION  01/31/1992   endometriosis   ORIF HUMERUS FRACTURE Left 04/01/2013   DR  BLACKMAN - shoulder   ORIF HUMERUS FRACTURE Left 04/01/2013   Procedure: OPEN REDUCTION INTERNAL FIXATION (ORIF) LEFT PROXIMAL HUMERUS FRACTURE;  Surgeon: Arnie Lao, MD;  Location: [facility] OR;  Service: Orthopedics;  Laterality: Left;   RADIOLOGY WITH ANESTHESIA N/A 11/19/2020   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;  Surgeon: Augusto Blonder, MD;  Location: [facility] OR;  Service: Radiology;  Laterality: N/A;   TONSILLECTOMY AND ADENOIDECTOMY       Current Outpatient Medications:    alendronate  (FOSAMAX ) 70 MG tablet, TAKE 1 TABLET (70 MG TOTAL) BY MOUTH EVERY 7 DAYS WITH FULL GLASS WATER ON EMPTY STOMACH, Disp: , Rfl:    amLODipine  (NORVASC ) 10 MG tablet, Take 1 tablet (10 mg total) by mouth daily., Disp: 90 tablet, Rfl: 1   asenapine (SAPHRIS) 5 MG SUBL 24 hr tablet, Place under the  tongue., Disp: , Rfl:    aspirin  EC 81 MG tablet, Take 81 mg by mouth daily. Swallow whole., Disp: , Rfl:    atorvastatin  (LIPITOR ) 80 MG tablet, Take 1 tablet (80 mg total) by mouth daily., Disp: 90 tablet, Rfl: 1   Budeson-Glycopyrrol-Formoterol  (BREZTRI  AEROSPHERE) 160-9-4.8 MCG/ACT AERO, Inhale 2 puffs into the lungs in the morning and at bedtime., Disp: 5.9 g, Rfl: 1   Cholecalciferol  (VITAMIN D -3 PO), Take 1 capsule by mouth daily., Disp: , Rfl:    clonazePAM  (KLONOPIN ) 0.5 MG tablet, Take 1 tablet (0.5 mg total) by mouth 2 (two) times daily as needed for anxiety., Disp: 180 tablet, Rfl: 1   clopidogrel  (PLAVIX ) 75 MG tablet, Take 1 tablet (75 mg total) by mouth daily., Disp: 90 tablet, Rfl: 1   Cyanocobalamin  (B-12) 1000 MCG SUBL, Place 1,000 mcg under the tongue daily., Disp: 90 tablet, Rfl: 3   desvenlafaxine  (PRISTIQ ) 50 MG 24 hr tablet, Take 1 tablet (50 mg total) by mouth daily., Disp: 90 tablet, Rfl: 1   escitalopram  (LEXAPRO ) 10 MG tablet, Take 1 tablet (10 mg total) by mouth daily., Disp: 90 tablet, Rfl: 1   ezetimibe  (ZETIA ) 10 MG tablet, Take 1 tablet (10 mg total) by mouth every evening., Disp: 90 tablet, Rfl: 3   folic acid  (FOLVITE ) 1 MG tablet, Take 1 tablet (1 mg total) by mouth daily., Disp: 90 tablet, Rfl: 1   levothyroxine  (SYNTHROID ) 150 MCG tablet, Take 1 tablet (150 mcg total) by mouth daily before breakfast., Disp: 90 tablet, Rfl: 1   Multiple Vitamins-Minerals (ZINC PO), Take by mouth daily., Disp: , Rfl:    ondansetron  (ZOFRAN -ODT) 4 MG disintegrating tablet, Take 1 tablet (4 mg total) by mouth every 8 (eight) hours as needed for nausea or vomiting., Disp: 10 tablet, Rfl: 0   pantoprazole  (PROTONIX ) 40 MG tablet, Take 1 tablet (40 mg total) by mouth daily., Disp: 90 tablet, Rfl: 3   topiramate  (TOPAMAX ) 25 MG tablet, Take 1 tablet (25 mg total) by mouth 2 (two) times daily., Disp: 120 tablet, Rfl: 3   Vitamin D , Ergocalciferol , (DRISDOL ) 1.25 MG (50000 UNIT) CAPS  capsule, Take 1 capsule (50,000 Units total) by mouth every 14 (fourteen) days., Disp: 6 capsule, Rfl: 3  EXAM:  VITALS per patient if applicable:     05/12/2023    2:30 AM 05/12/2023    1:15 AM 05/12/2023    1:00 AM  Vitals with BMI  Systolic 118 119 098  Diastolic 75 90 87  Pulse 69 75 74    GENERAL: alert, oriented, appears well and in no acute distress  HEENT: atraumatic, conjunttiva clear,  no obvious abnormalities on inspection of external nose and ears  NECK: normal movements of the head and neck  LUNGS: on inspection no signs of respiratory distress, breathing rate appears normal, no obvious gross SOB, gasping or wheezing  CV: no obvious cyanosis  MS: moves all visible extremities without noticeable abnormality  PSYCH/NEURO: pleasant and cooperative, no obvious depression or anxiety, speech and thought processing grossly intact  LABS: none today    Chemistry      Component Value Date/Time   NA 142 05/11/2023 2223   K 3.7 05/11/2023 2223   CL 107 05/11/2023 2223   CO2 23 05/11/2023 2215   BUN 11 05/11/2023 2223   CREATININE 1.00 05/11/2023 2223   CREATININE 0.92 06/06/2019 1435      Component Value Date/Time   CALCIUM  9.0 05/11/2023 2215   ALKPHOS 60 05/11/2023 2215   AST 19 05/11/2023 2215   ALT 18 05/11/2023 2215   BILITOT 0.5 05/11/2023 2215     Lab Results  Component Value Date   WBC 6.0 05/11/2023   HGB 13.3 05/11/2023   HCT 39.0 05/11/2023   MCV 90.0 05/11/2023   PLT 226 05/11/2023   Lab Results  Component Value Date   HGBA1C 5.6 10/13/2022   ASSESSMENT AND PLAN:  Discussed the following assessment and plan:  Acute bronchitis, suspect viral etiology. Prednisone 20 mg a day x 5 days. Continue albuterol  up to 4 times a day. She has Promethazine  DM to use every 6 hours as needed for cough. Signs/symptoms to call or return for were reviewed and pt expressed understanding.   I discussed the assessment and treatment plan with the patient.  The patient was provided an opportunity to ask questions and all were answered. The patient agreed with the plan and demonstrated an understanding of the instructions.   F/u: If not significantly improving in the next 5 days  Signed:  Arletha Lady, MD           06/12/2023

## 2023-06-12 NOTE — Telephone Encounter (Signed)
 Spoke with pt. Pt scheduled for virtual w Dr. McGowen.

## 2023-07-02 ENCOUNTER — Ambulatory Visit (INDEPENDENT_AMBULATORY_CARE_PROVIDER_SITE_OTHER)

## 2023-07-02 DIAGNOSIS — E538 Deficiency of other specified B group vitamins: Secondary | ICD-10-CM | POA: Diagnosis not present

## 2023-07-02 MED ORDER — CYANOCOBALAMIN 1000 MCG/ML IJ SOLN
1000.0000 ug | Freq: Once | INTRAMUSCULAR | Status: AC
  Administered 2023-07-02: 1000 ug via INTRAMUSCULAR

## 2023-07-02 NOTE — Progress Notes (Signed)
Pt here for monthly B12 injection per Dr.Kuneff  B12 1000mcg given IM right deltoid, and pt tolerated injection well.  Next B12 injection scheduled for 1 month.  

## 2023-07-04 ENCOUNTER — Ambulatory Visit: Admitting: Physician Assistant

## 2023-07-04 ENCOUNTER — Ambulatory Visit: Payer: Self-pay

## 2023-07-04 ENCOUNTER — Encounter: Payer: Self-pay | Admitting: Family Medicine

## 2023-07-04 ENCOUNTER — Ambulatory Visit (INDEPENDENT_AMBULATORY_CARE_PROVIDER_SITE_OTHER): Admitting: Family Medicine

## 2023-07-04 ENCOUNTER — Telehealth: Payer: Self-pay

## 2023-07-04 VITALS — BP 122/82 | HR 91 | Temp 98.3°F | Wt 177.8 lb

## 2023-07-04 DIAGNOSIS — R5383 Other fatigue: Secondary | ICD-10-CM

## 2023-07-04 DIAGNOSIS — R400 Somnolence: Secondary | ICD-10-CM | POA: Diagnosis not present

## 2023-07-04 DIAGNOSIS — R7309 Other abnormal glucose: Secondary | ICD-10-CM

## 2023-07-04 DIAGNOSIS — E559 Vitamin D deficiency, unspecified: Secondary | ICD-10-CM

## 2023-07-04 DIAGNOSIS — R569 Unspecified convulsions: Secondary | ICD-10-CM

## 2023-07-04 DIAGNOSIS — E538 Deficiency of other specified B group vitamins: Secondary | ICD-10-CM | POA: Diagnosis not present

## 2023-07-04 DIAGNOSIS — R55 Syncope and collapse: Secondary | ICD-10-CM | POA: Diagnosis not present

## 2023-07-04 DIAGNOSIS — M25512 Pain in left shoulder: Secondary | ICD-10-CM

## 2023-07-04 DIAGNOSIS — G8929 Other chronic pain: Secondary | ICD-10-CM | POA: Diagnosis not present

## 2023-07-04 DIAGNOSIS — R159 Full incontinence of feces: Secondary | ICD-10-CM

## 2023-07-04 NOTE — Telephone Encounter (Signed)
 Reason for CRM: Patient called back to let Dr Marylee Snowball know that her neurology appointment is July 14 at [facility].

## 2023-07-04 NOTE — Progress Notes (Signed)
 Tiffany Horn , 06-10-1960, 63 y.o., female MRN: [id number] Patient Care Team    Relationship Specialty Notifications Start End  Mariel Shope, DO PCP - General Family Medicine  08/21/22   Hugh Madura, MD PCP - Cardiology Cardiology  08/21/22   Sergio Dandy, MD Consulting Physician Gastroenterology  05/29/16   Casimir Cleaver, MD Referring Physician Obstetrics and Gynecology  07/17/17   Prudy Brownie, DO (Inactive) Consulting Physician Pulmonary Disease  06/12/19   Greta Leatherwood, MD Consulting Physician Obstetrics and Gynecology  08/30/22     Chief Complaint  Patient presents with   Fatigue    Increased fatigue x few months. Requesting labs to recheck her vitamin levels/TSH.      Subjective: Tiffany Horn is a 63 y.o. Pt presents for an OV with complaints of increased fatigue of 3 months duration.  She reports she feels like she is doing well from a depression anxiety standpoint, feels extremely fatigued.  Wonders if her thyroid  or vitamin levels are off again. She is taking vitamin D  and B12 supplements She reports she was in the emergency with seizure-like activity and had CT completed which was reportedly reassuring. She has seen her neurosurgeon who reassured her that the vascular aspects of her brain are stable.  She did not follow-up with neurology as of yet.  She last saw [name] January 2025 and was started on Topamax  twice daily. She has not seen her neurology team since having possible seizure-like activity. The seizure-like activity was unwitnessed.  She states she has had symptoms 3 times where she would start to feel off, and she would pass out.  Sometimes passing out would be a few minutes.  However 1 episode she did not wake up for 7 hours.  Every time she has soiled herself.     11/03/2022    1:30 PM 10/13/2022    9:01 AM 09/05/2022    2:08 PM 07/05/2022    9:34 AM 09/13/2021    2:52 PM  Depression screen PHQ 2/9  Decreased Interest 1 2 2  3 1   Down, Depressed, Hopeless 1 3 3 3  0  PHQ - 2 Score 2 5 5 6 1   Altered sleeping 2 2 3 3 3   Tired, decreased energy 1 3 2 3 2   Change in appetite 0 3 3 1  0  Feeling bad or failure about yourself  0 2 3 1  0  Trouble concentrating 1 3 2 2 2   Moving slowly or fidgety/restless 0 2 3 0 0  Suicidal thoughts 0 0 1 0 0  PHQ-9 Score 6 20 22 16 8   Difficult doing work/chores Somewhat difficult Very difficult Very difficult Somewhat difficult     Allergies  Allergen Reactions   Abilify  [Aripiprazole ] Nausea And Vomiting   Amoxil [Amoxicillin] Diarrhea   Promethazine  Hcl Other (See Comments)    Made pt feel "weird"   Seroquel [Quetiapine Fumarate] Other (See Comments)    Pt does not recall reaction   Social History   Social History Narrative   She is originally from [place]. She has traveled to [facility], [place], [place] , [place], [place], [place], [place], [place], [place] ,& [place]. No international travel. She has dogs. No prior bird, mold, or recent hot tub exposure. She hasn't used her hot tub in 1.5 years. 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 Previously enjoyed gardening and playing tennis. Helps  to care for her mother.   Past Medical History:  Diagnosis Date   Allergy     Anxiety    Arthritis    knees and spine, shoulder   Asthma    Bronchitis    hx - recurrent   Complication of anesthesia    waking up is not easy   Depression    Elevated IgE level 09/12/2017   09/12/2017 IgE 195   GERD (gastroesophageal reflux disease)    History of COVID-19 12/30/2020   Hx of irritable bowel syndrome    x2   Hyperlipidemia    diet controlled - no medication   Hypertension    not taking any meds at present - under control per patient   Hypokalemia    with PNA admission (2.5)   Hypothyroidism (acquired)    Ischemic cerebrovascular accident (CVA) (HCC)    Migraines    Osteoporosis    Pneumonia    4 episodes; hosp. admission 2014   PONV (postoperative nausea and  vomiting)    TIA (transient ischemic attack) 12/17/2020   UC (ulcerative colitis) [facility])    DX'D 2021   Past Surgical History:  Procedure Laterality Date   ANEURYSM COILING     STENT   BREAST SURGERY     implants, then had them removed   COLONOSCOPY     greater 10 yrs ago - ? [facility]-2017 LAST   DILATION AND CURETTAGE OF UTERUS     IR ANGIO INTRA EXTRACRAN SEL INTERNAL CAROTID BILAT MOD SED  11/19/2020   IR ANGIO VERTEBRAL SEL VERTEBRAL BILAT MOD SED  11/19/2020   IR ANGIO VERTEBRAL SEL VERTEBRAL UNI L MOD SED  02/25/2021   IR ANGIO VERTEBRAL SEL VERTEBRAL UNI L MOD SED  01/13/2022   IR ANGIO VERTEBRAL SEL VERTEBRAL UNI L MOD SED  12/22/2022   IR ANGIOGRAM FOLLOW UP STUDY  11/19/2020   IR ANGIOGRAM FOLLOW UP STUDY  11/19/2020   IR ANGIOGRAM FOLLOW UP STUDY  11/19/2020   IR ANGIOGRAM FOLLOW UP STUDY  11/19/2020   IR ANGIOGRAM FOLLOW UP STUDY  11/19/2020   IR ANGIOGRAM FOLLOW UP STUDY  11/19/2020   IR ANGIOGRAM FOLLOW UP STUDY  11/19/2020   IR ANGIOGRAM FOLLOW UP STUDY  11/19/2020   IR INTRA CRAN STENT  11/19/2020   IR TRANSCATH/EMBOLIZ  11/19/2020   LAPAROSCOPIC ABDOMINAL EXPLORATION  01/31/1992   endometriosis   ORIF HUMERUS FRACTURE Left 04/01/2013   DR Lucienne Ryder - shoulder   ORIF HUMERUS FRACTURE Left 04/01/2013   Procedure: OPEN REDUCTION INTERNAL FIXATION (ORIF) LEFT PROXIMAL HUMERUS FRACTURE;  Surgeon: Arnie Lao, MD;  Location: [facility] OR;  Service: Orthopedics;  Laterality: Left;   RADIOLOGY WITH ANESTHESIA N/A 11/19/2020   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;  Surgeon: Augusto Blonder, MD;  Location: [facility] OR;  Service: Radiology;  Laterality: N/A;   TONSILLECTOMY AND ADENOIDECTOMY     Family History  Problem Relation Age of Onset   Breast cancer Mother    Diabetes Mother    Heart disease Mother    Diabetes Father    Dementia Father    Colon cancer Father    Stroke Paternal Grandmother    Diabetes Paternal Grandmother    Colon  polyps Neg Hx    Esophageal cancer Neg Hx    Stomach cancer Neg Hx    Rectal cancer Neg Hx    Crohn's disease Neg Hx    Ulcerative colitis Neg Hx    Allergies as of 07/04/2023  Reactions   Abilify  [aripiprazole ] Nausea And Vomiting   Amoxil [amoxicillin] Diarrhea   Promethazine  Hcl Other (See Comments)   Made pt feel "weird"   Seroquel [quetiapine Fumarate] Other (See Comments)   Pt does not recall reaction        Medication List        Accurate as of July 04, 2023 11:59 PM. If you have any questions, ask your nurse or doctor.          STOP taking these medications    asenapine 5 MG Subl 24 hr tablet Commonly known as: SAPHRIS Stopped by: Napolean Backbone   predniSONE  20 MG tablet Commonly known as: DELTASONE  Stopped by: Napolean Backbone       TAKE these medications    alendronate  70 MG tablet Commonly known as: FOSAMAX  TAKE 1 TABLET (70 MG TOTAL) BY MOUTH EVERY 7 DAYS WITH FULL GLASS WATER ON EMPTY STOMACH   amLODipine  10 MG tablet Commonly known as: NORVASC  Take 1 tablet (10 mg total) by mouth daily.   aspirin  EC 81 MG tablet Take 81 mg by mouth daily. Swallow whole.   atorvastatin  80 MG tablet Commonly known as: LIPITOR  Take 1 tablet (80 mg total) by mouth daily.   B-12 1000 MCG Subl Place 1,000 mcg under the tongue daily.   Breztri  Aerosphere 160-9-4.8 MCG/ACT Aero inhaler Generic drug: budesonide -glycopyrrolate -formoterol  Inhale 2 puffs into the lungs in the morning and at bedtime.   clonazePAM  0.5 MG tablet Commonly known as: KLONOPIN  Take 1 tablet (0.5 mg total) by mouth 2 (two) times daily as needed for anxiety.   clopidogrel  75 MG tablet Commonly known as: PLAVIX  Take 1 tablet (75 mg total) by mouth daily.   desvenlafaxine  50 MG 24 hr tablet Commonly known as: Pristiq  Take 1 tablet (50 mg total) by mouth daily.   escitalopram  10 MG tablet Commonly known as: Lexapro  Take 1 tablet (10 mg total) by mouth daily.   ezetimibe  10 MG  tablet Commonly known as: Zetia  Take 1 tablet (10 mg total) by mouth every evening.   folic acid  1 MG tablet Commonly known as: FOLVITE  Take 1 tablet (1 mg total) by mouth daily.   levothyroxine  150 MCG tablet Commonly known as: Synthroid  Take 1 tablet (150 mcg total) by mouth daily before breakfast.   ondansetron  4 MG disintegrating tablet Commonly known as: ZOFRAN -ODT Take 1 tablet (4 mg total) by mouth every 8 (eight) hours as needed for nausea or vomiting.   pantoprazole  40 MG tablet Commonly known as: PROTONIX  Take 1 tablet (40 mg total) by mouth daily.   topiramate  25 MG tablet Commonly known as: TOPAMAX  Take 1 tablet (25 mg total) by mouth 2 (two) times daily.   Vitamin D  (Ergocalciferol ) 1.25 MG (50000 UNIT) Caps capsule Commonly known as: DRISDOL  Take 1 capsule (50,000 Units total) by mouth every 14 (fourteen) days.   VITAMIN D -3 PO Take 1 capsule by mouth daily.   ZINC PO Take by mouth daily.        All past medical history, surgical history, allergies, family history, immunizations andmedications were updated in the EMR today and reviewed under the history and medication portions of their EMR.     ROS Negative, with the exception of above mentioned in HPI   Objective:  BP 122/82   Pulse 91   Temp 98.3 F (36.8 C)   Wt 177 lb 12.8 oz (80.6 kg)   LMP 01/31/2007   SpO2 98%   BMI 28.70 kg/m  Body  mass index is 28.7 kg/m. Physical Exam Vitals and nursing note reviewed.  Constitutional:      General: She is not in acute distress.    Appearance: Normal appearance. She is not ill-appearing, toxic-appearing or diaphoretic.  HENT:     Head: Normocephalic and atraumatic.  Eyes:     General: No scleral icterus.       Right eye: No discharge.        Left eye: No discharge.     Extraocular Movements: Extraocular movements intact.     Conjunctiva/sclera: Conjunctivae normal.     Pupils: Pupils are equal, round, and reactive to light.  Cardiovascular:      Rate and Rhythm: Normal rate and regular rhythm.     Heart sounds: No murmur heard. Pulmonary:     Effort: Pulmonary effort is normal. No respiratory distress.     Breath sounds: Normal breath sounds. No wheezing, rhonchi or rales.  Musculoskeletal:     Cervical back: Neck supple.     Right lower leg: No edema.     Left lower leg: No edema.  Lymphadenopathy:     Cervical: No cervical adenopathy.  Skin:    General: Skin is warm.     Findings: No rash.  Neurological:     Mental Status: She is alert and oriented to person, place, and time. Mental status is at baseline.     Motor: No weakness.     Gait: Gait normal.  Psychiatric:        Mood and Affect: Mood normal.        Behavior: Behavior normal.        Thought Content: Thought content normal.        Judgment: Judgment normal.     No results found. No results found. No results found for this or any previous visit (from the past 24 hours).  Assessment/Plan: Tiffany Horn is a 63 y.o. female present for OV for  B12 deficiency (Primary)/vitamin D  sufficiency/fatigue/folate deficiency  - TSH - T4, free - Vitamin D  (25 hydroxy) - B12 and Folate Panel - Vitamin B1 - Iron, TIBC and Ferritin Panel - Basic Metabolic Panel (BMET) - Thyroid  peroxidase antibody - PTH, Intact and Calcium  - Hemoglobin A1c  fatigue - TSH - T4, free - Vitamin D  (25 hydroxy) - B12 and Folate Panel - Vitamin B1 - Iron, TIBC and Ferritin Panel - Basic Metabolic Panel (BMET) - Thyroid  peroxidase antibody - PTH, Intact and Calcium  - Hemoglobin A1c Sleep apnea referral placed  Elevated hemoglobin A1c - Hemoglobin A1c  Syncope, unspecified syncope type/seizure like activity/. Incontinence of feces, unspecified fecal incontinence type/Seizure-like activity (HCC) - TSH - T4, free - Vitamin D  (25 hydroxy) - B12 and Folate Panel - Vitamin B1 - Iron, TIBC and Ferritin Panel - Basic Metabolic Panel (BMET) - Thyroid  peroxidase  antibody - PTH, Intact and Calcium  - Hemoglobin A1c - CK - MR Brain Wo Contrast; Future  Reviewed expectations re: course of current medical issues. Discussed self-management of symptoms. Outlined signs and symptoms indicating need for more acute intervention. Patient verbalized understanding and all questions were answered. Patient received an After-Visit Summary.    Orders Placed This Encounter  Procedures   MR Brain Wo Contrast   TSH   T4, free   Vitamin D  (25 hydroxy)   B12 and Folate Panel   Vitamin B1   Iron, TIBC and Ferritin Panel   Basic Metabolic Panel (BMET)   Thyroid  peroxidase antibody   PTH, Intact  and Calcium    Hemoglobin A1c   CK   Ambulatory referral to Pulmonology   Ambulatory referral to Neurology   No orders of the defined types were placed in this encounter.  Referral Orders         Ambulatory referral to Pulmonology         Ambulatory referral to Neurology       Note is dictated utilizing voice recognition software. Although note has been proof read prior to signing, occasional typographical errors still can be missed. If any questions arise, please do not hesitate to call for verification.   electronically signed by:  Napolean Backbone, DO  [facility] - OR

## 2023-07-04 NOTE — Progress Notes (Signed)
 HPI: Tiffany Horn returns today due to left shoulder pain.  She is someone who underwent ORIF of the left proximal humerus fracture 04/01/2013 by Dr. Lucienne Ryder.  Comes in today due to increased shoulder pain.  She states that the last injection on 04/24/2023 with Dr. Vaughn Georges which was an intra-articular injection gave her no lasting relief.  She has had no new injury to the shoulder.  Shoulder pain daily can be up to 10 out of 10.  It is worse with certain movements like rolling on her side or turning quickly.  She states that shoulder does wake her up at night.  She denies any numbness tingling down the arm.  MRI of her shoulder in 2024 showed no muscle atrophy.  Rotator cuff to be intact.  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  She remains on chronic anticoagulation secondary to history of CVA.  She also has a basilar artery aneurysm.  Review of systems see HPI otherwise negative  Physical exam: General Well-developed well-nourished female no acute distress mood affect appropriate. Bilateral shoulders: Forward flexion right shoulder 180 degrees left shoulder 160 degrees.  She has pain with impingement testing on the left negative on the right.  5-5 strength with external and internal rotation against resistance empty can test is negative bilaterally.  Liftoff test negative bilaterally.  Radiographs: Left shoulder 3 views compared to films 02/13/2022 showed no significant change.  Posttraumatic changes with irregularity of the humeral head.  No hardware failure.  No acute findings.  Shoulder is located but low riding.  Impression: Chronic left shoulder pain  Plan: Discussed with her options to try to control her shoulder pain these include intra-articular injections which have been of little help.  She is not interested in this at this point in time.  Surgical referral to Dr. Rozelle Corning for possible hardware removal and shoulder replacement.  She is not a candidate for this at this point  in time and she states that she has been told she cannot come off anticoagulation or have any elective procedures performed.  Therefore we did give her a prescription for physical therapy to work on range of motion strengthening shoulder.  She will follow-up with us  as needed pain persist or becomes worse.  She can call if she wants an intra articular injection of the left shoulder.

## 2023-07-05 ENCOUNTER — Ambulatory Visit: Payer: Self-pay | Admitting: Family Medicine

## 2023-07-06 LAB — CK: Total CK: 64 U/L (ref 7–177)

## 2023-07-09 LAB — BASIC METABOLIC PANEL WITH GFR
BUN: 12 mg/dL (ref 7–25)
CO2: 27 mmol/L (ref 20–32)
Calcium: 9.5 mg/dL (ref 8.6–10.4)
Chloride: 104 mmol/L (ref 98–110)
Creat: 0.68 mg/dL (ref 0.50–1.05)
Glucose, Bld: 100 mg/dL — ABNORMAL HIGH (ref 65–99)
Potassium: 4.4 mmol/L (ref 3.5–5.3)
Sodium: 141 mmol/L (ref 135–146)
eGFR: 99 mL/min/{1.73_m2} (ref >=60)

## 2023-07-09 LAB — VITAMIN B1: Vitamin B1 (Thiamine): 12 nmol/L (ref 8–30)

## 2023-07-09 LAB — HEMOGLOBIN A1C
Hgb A1c MFr Bld: 5.7 % — ABNORMAL HIGH (ref <5.7)
Mean Plasma Glucose: 117 mg/dL
eAG (mmol/L): 6.5 mmol/L

## 2023-07-09 LAB — VITAMIN D 25 HYDROXY (VIT D DEFICIENCY, FRACTURES): Vit D, 25-Hydroxy: 39 ng/mL (ref 30–100)

## 2023-07-09 LAB — IRON,TIBC AND FERRITIN PANEL
%SAT: 40 % (ref 16–45)
Ferritin: 35 ng/mL (ref 16–288)
Iron: 129 ug/dL (ref 45–160)
TIBC: 319 ug/dL (ref 250–450)

## 2023-07-09 LAB — T4, FREE: Free T4: 1 ng/dL (ref 0.8–1.8)

## 2023-07-09 LAB — PTH, INTACT AND CALCIUM
Calcium: 9.3 mg/dL (ref 8.6–10.4)
PTH: 26 pg/mL (ref 16–77)

## 2023-07-09 LAB — B12 AND FOLATE PANEL
Folate: 8.3 ng/mL
Vitamin B-12: 1120 pg/mL — ABNORMAL HIGH (ref 200–1100)

## 2023-07-09 LAB — THYROID PEROXIDASE ANTIBODY: Thyroperoxidase Ab SerPl-aCnc: 453 [IU]/mL — ABNORMAL HIGH (ref <9)

## 2023-07-09 LAB — TSH: TSH: 3.13 m[IU]/L (ref 0.40–4.50)

## 2023-07-11 ENCOUNTER — Ambulatory Visit (HOSPITAL_BASED_OUTPATIENT_CLINIC_OR_DEPARTMENT_OTHER)
Admission: RE | Admit: 2023-07-11 | Discharge: 2023-07-11 | Disposition: A | Discharge status: Home / Self Care | Source: Ambulatory Visit | Attending: Family Medicine | Admitting: Family Medicine

## 2023-07-11 DIAGNOSIS — R159 Full incontinence of feces: Secondary | ICD-10-CM | POA: Insufficient documentation

## 2023-07-11 DIAGNOSIS — R569 Unspecified convulsions: Secondary | ICD-10-CM | POA: Diagnosis present

## 2023-07-11 DIAGNOSIS — R55 Syncope and collapse: Secondary | ICD-10-CM | POA: Insufficient documentation

## 2023-07-11 NOTE — Telephone Encounter (Signed)
 Please call pt: Her brain Mri resulted with findings not mentioned in prior MRI from July.   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 -Tiny chronic infarct within the right cerebellar hemisphere.  Punctate chronic microhemorrhage within the right occipital lobe> this was seen in July.   It is possible the times she had passed out, were small strokes. Neurology will better evaluate. I have forwarded the result to her neurology team. I also communicated with them to see if they can work her in sooner than her July appt. They will contact her if they can.

## 2023-07-12 NOTE — Telephone Encounter (Signed)
 No further action needed at this time.

## 2023-07-13 ENCOUNTER — Telehealth: Payer: Self-pay | Admitting: Cardiology

## 2023-07-13 NOTE — Telephone Encounter (Signed)
 Pt called in asking if Dr. Renna Cary can review her recent mri results in Epic and give his advice/recommendations.

## 2023-07-13 NOTE — Telephone Encounter (Signed)
 I had told the patient she needed to follow with neurology. Please see prior notes.

## 2023-07-13 NOTE — Telephone Encounter (Signed)
 Communication  Reason for CRM: Patient stated that the Neurosurgery office said that if it wasn't aneurysm related she would need to follow up with her pcp and neurologist. Patient is frustrated and is requesting a callback regarding what should be done. Patient also left message in mychart and wants to make sure Dr. Marylee Snowball.   See pt MyChart msg.

## 2023-07-16 ENCOUNTER — Ambulatory Visit: Payer: Self-pay

## 2023-07-16 NOTE — Telephone Encounter (Signed)
Spoke with patient and she is aware of provider recommendations. She verbalized understanding. 

## 2023-07-16 NOTE — Telephone Encounter (Signed)
 FYI Only or Action Required?: FYI only for provider  Patient is followed in Pulmonology for a New Patient appointment. Called Nurse Triage reporting No chief complaint on file.. Symptoms began several months ago. Interventions attempted: Nothing. Symptoms are: unchanged.  Triage Disposition: Call PCP When Office is Open  Patient/caregiver understands and will follow disposition?: Yes  Copied from CRM [id number]. Topic: Clinical - Red Word Triage >> Jul 16, 2023 11:14 AM Tyronne Galloway wrote: Red Word that prompted transfer to Nurse Triage: Pt was seen June 4th by PCP Dr. Marylee Snowball due to having 2 mini strokes post MRI. Pt was referred as a new patient to see a pulm for  R53.83 (ICD-10-CM) - Other fatigue R55 (ICD-10-CM) - Syncope, unspecified syncope type R40.0 (ICD-10-CM) - Daytime somnolence with no appt scheduled. Pt mentioned extreme fatigue and at times shortness of breath, blurry double vision that led loss of consciousness.  Disconnected during call.   Reason for Disposition  Requesting regular office appointment  Answer Assessment - Initial Assessment Questions 1. REASON FOR CALL or QUESTION: What is your reason for calling today? or How can I best help you? or What question do you have that I can help answer?     Appointment with Pulmonlogy  Protocols used: Information Only Call - No Triage-A-AH

## 2023-07-18 NOTE — Progress Notes (Deleted)
 [facility] [address] [place]. [place] [address] [phone]       OFFICE FOLLOW-UP NOTE  [name] Date of Birth:  August 12, 1960 Medical Record Number:  [id number]   HPI:   Update 07/19/2023 JM: Patient returns for follow-up visit.  Presented to ED 4/11 reporting episode of right eye twitching and then either passed out or had a seizure that was unwitnessed per ED report.  Did have episode of incontinence with diarrhea.  No prior history of seizures.  She did have a persistent headache and treated with headache cocktail.  CT head and CTA no acute abnormality.  She was advised to follow-up with neurology but never scheduled follow-up with.  Saw PCP a couple weeks ago who ordered lab work, MRI brain and referred back here for possible seizure activity.       History provided for reference purposes only Update 02/05/2023 [name]: She returns for follow-up after last visit 2 years ago.  Patient states she is doing currently well.  She however continues to have 2 different kinds of headaches.  She has had multiple episodes of what has been called complicated migraine.  She had an episode in July 2024 when she was seen with headache with left-sided paresthesias.  Neuroimaging was negative.  She was diagnosed with complicated migraine.  She states she has had 2 or 3 similar but minor episodes since then.  She is unable to admit for specific triggers for these episodes.  She is now learned that these are related to migraine and takes some Tylenol  which she usually gets rid of the headache and episode passes away.  She also reports of different kind of headache which is behind her eyes and is sharp and transient and shooting in quality.  She takes 2 pack Tylenol  powder every day to get rid of the headaches.  She has not been on any specific medication for headache prophylaxis.  She has quit coffee and surprisingly has not noticed any worsening of her headaches.  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  She has no new complaints today.  MRI brain 08/21/2022 showed no acute abnormality and CT angiogram of the brain 11/27/2022 showed no acute occlusion.  Stenting distal basilar artery with coiling of the basilar tip aneurysm was noted.  There is stenosis of proximal right P2 segment just distal to the stent which is stable.  Update 02/10/2021 [name]: She returns for follow-up after last visit a month ago.  At last visit she had significant dizziness and feeling of off balance and leaning to the left and she was referred urgently by me to the emergency room where she had an MRI scan of the brain on 01/10/2021 which showed a few additional tiny punctate right cerebellar subacute foci of restricted diffusion which could not explain her clinical exam of some left-sided subjective weakness and leaning.  EEG was ordered but I cannot see the results.  Patient was loaded with Keppra  and discharged home on that.  Hypercoagulable panel labs were sent and I have reviewed them and all of them were normal.  Patient states symptoms recently resolved within a day or 2 and she is done well since then she has had no further episodes of dizziness and leaning to the left.  She did not tolerate Keppra  well due to headache and dizziness and stopped it  within a week.  She is on aspirin  and Brilinta  tolerating it well without bleeding or bruising.  She has an upcoming visit with Dr. Nat Badger in a few weeks.  Her blood pressure is well controlled.  She has no new complaints.  Initial visit 01/10/2021 [name]: [name] is a 63 year old Caucasian lady seen today for initial office follow-up visit following hospital consultation in November 2022.  He has past medical history of hypertension, hyperlipidemia, asthma, bronchitis, anxiety and thyroid  disease.  She  underwent stent assisted coiling of basilar artery aneurysm by Dr. Nat Badger on 11/19/2020.  She was seen in the ER 3 weeks later with headaches, dizziness and imbalance.  She was seen in [place], ER and had an MRI scan of the brain done on 12/02/2020 which showed small focus of acute infarct in the right thalamus and right occipital lobe with a right PCA territory coil mass near the tip of the basilar artery residual aneurysm filling noted.  CT angiogram of the brain done on 12/04/2020 showed similar findings.  Patient presented again to the hospital on 12/16/2020 with sudden onset of vertigo, blurred vision and left-sided weakness.  Repeat MRI at this time showed 2 additional infarcts which are punctate in the right cerebellum with expected evolutionary changes of the previously seen right thalamic and right occipital.  She had severe headache at this time as well.  CT angiogram of the head and neck showed focal high-grade stenosis of the right P2 after the end of the stent with diminutive reconstitution of flow in the remainder of the right PCA which is unchanged.  Stable stent assisted coil embolization of the basilar tip aneurysm.  Hemoglobin A1c was 5.4, LDL cholesterol 156 mg percent and 2D echo showed ejection fraction of 60 to 65%.  Lower extremity venous Dopplers 1 negative for DVT.  TCD bubble study was negative for right-to-left shunt.  Patient was continued on aspirin  and Brilinta  and Lipitor ..30-day heart monitor was ordered but so far has not been done.  Patient states she was doing quite well until this morning when she woke up with severe occipital headache as well as feeling dizzy, off balance and leaning to the left.  Since then the headache has subsided somewhat but not completely gone.  She still feeling nauseous and dizzy and had some blurred vision as well slightly off balance.  I advised her to go to the emergency room for further evaluation for stroke she appeared reluctant initially but  then agreed.  I recommended she go by ambulance but patient declined.  She has presented outside time window for thrombolysis and clinical exam is not suggestive of LVO.        ROS:   14 system review of systems is positive for headache, , dizziness, blurred vision, weakness, incoordination, ataxia and all other systems negative  PMH:  Past Medical History:  Diagnosis Date   Allergy     Anxiety    Arthritis    knees and spine, shoulder   Asthma    Bronchitis    hx - recurrent   Complication of anesthesia    waking up is not easy   Depression    Elevated IgE level 09/12/2017   09/12/2017 IgE 195   GERD (gastroesophageal reflux disease)    History of COVID-19 12/30/2020   Hx of irritable bowel syndrome    x2   Hyperlipidemia    diet controlled - no medication   Hypertension    not taking any meds at  present - under control per patient   Hypokalemia    with PNA admission (2.5)   Hypothyroidism (acquired)    Ischemic cerebrovascular accident (CVA) (HCC)    Migraines    Osteoporosis    Pneumonia    4 episodes; hosp. admission 2014   PONV (postoperative nausea and vomiting)    TIA (transient ischemic attack) 12/17/2020   UC (ulcerative colitis) [facility])    DX'D 2021    Social History:  Social History   Socioeconomic History   Marital status: Single    Spouse name: Not on file   Number of children: 0   Years of education: Not on file   Highest education level: Bachelor's degree (e.g., BA, AB, BS)  Occupational History   Occupation: Geologist, engineering  Tobacco Use   Smoking status: Never    Passive exposure: Never   Smokeless tobacco: Never  Vaping Use   Vaping status: Never Used  Substance and Sexual Activity   Alcohol use: Yes    Alcohol/week: 1.0 standard drink of alcohol    Types: 1 Glasses of wine per week    Comment: SOCIALLY   Drug use: No   Sexual activity: Yes    Partners: Male    Birth control/protection: Post-menopausal  Other Topics Concern    Not on file  Social History Narrative   She is originally from [place]. She has traveled to [facility], [place], [place] , [place], [place], [place], [place], [place], [place] ,& [place]. No international travel. She has dogs. No prior bird, mold, or recent hot tub exposure. She hasn't used her hot tub in 1.5 years. 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 Previously enjoyed gardening and playing tennis. Helps to care for her mother.   Social Drivers of Corporate investment banker Strain: Low Risk  (10/09/2022)   Overall Financial Resource Strain (CARDIA)    Difficulty of Paying Living Expenses: Not hard at all  Food Insecurity: No Food Insecurity (10/09/2022)   Hunger Vital Sign    Worried About Running Out of Food in the Last Year: Never true    Ran Out of Food in the Last Year: Never true  Transportation Needs: No Transportation Needs (10/09/2022)   PRAPARE - Administrator, Civil Service (Medical): No    Lack of Transportation (Non-Medical): No  Physical Activity: Insufficiently Active (10/09/2022)   Exercise Vital Sign    Days of Exercise per Week: 1 day    Minutes of Exercise per Session: 20 min  Stress: Stress Concern Present (10/09/2022)   [employer] of Occupational Health - Occupational Stress Questionnaire    Feeling of Stress : Very much  Social Connections: Moderately Isolated (10/09/2022)   Social Connection and Isolation Panel    Frequency of Communication with Friends and Family: More than three times a week    Frequency of Social Gatherings with Friends and Family: Twice a week    Attends Religious Services: 1 to 4 times per year    Active Member of [employer] or Organizations: No    Attends Banker Meetings: Not on file    Marital Status: Divorced  Intimate Partner Violence: Not At Risk (01/05/2022)   Humiliation, Afraid, Rape, and Kick questionnaire    Fear of Current or Ex-Partner: No    Emotionally Abused: No    Physically Abused: No     Sexually Abused: No    Medications:   Current Outpatient Medications on File  Prior to Visit  Medication Sig Dispense Refill   alendronate  (FOSAMAX ) 70 MG tablet TAKE 1 TABLET (70 MG TOTAL) BY MOUTH EVERY 7 DAYS WITH FULL GLASS WATER ON EMPTY STOMACH     amLODipine  (NORVASC ) 10 MG tablet Take 1 tablet (10 mg total) by mouth daily. 90 tablet 1   aspirin  EC 81 MG tablet Take 81 mg by mouth daily. Swallow whole.     atorvastatin  (LIPITOR ) 80 MG tablet Take 1 tablet (80 mg total) by mouth daily. 90 tablet 1   Budeson-Glycopyrrol-Formoterol  (BREZTRI  AEROSPHERE) 160-9-4.8 MCG/ACT AERO Inhale 2 puffs into the lungs in the morning and at bedtime. 5.9 g 1   Cholecalciferol  (VITAMIN D -3 PO) Take 1 capsule by mouth daily.     clonazePAM  (KLONOPIN ) 0.5 MG tablet Take 1 tablet (0.5 mg total) by mouth 2 (two) times daily as needed for anxiety. 180 tablet 1   clopidogrel  (PLAVIX ) 75 MG tablet Take 1 tablet (75 mg total) by mouth daily. 90 tablet 1   Cyanocobalamin  (B-12) 1000 MCG SUBL Place 1,000 mcg under the tongue daily. 90 tablet 3   desvenlafaxine  (PRISTIQ ) 50 MG 24 hr tablet Take 1 tablet (50 mg total) by mouth daily. 90 tablet 1   escitalopram  (LEXAPRO ) 10 MG tablet Take 1 tablet (10 mg total) by mouth daily. 90 tablet 1   ezetimibe  (ZETIA ) 10 MG tablet Take 1 tablet (10 mg total) by mouth every evening. 90 tablet 3   folic acid  (FOLVITE ) 1 MG tablet Take 1 tablet (1 mg total) by mouth daily. 90 tablet 1   levothyroxine  (SYNTHROID ) 150 MCG tablet Take 1 tablet (150 mcg total) by mouth daily before breakfast. 90 tablet 1   Multiple Vitamins-Minerals (ZINC PO) Take by mouth daily.     ondansetron  (ZOFRAN -ODT) 4 MG disintegrating tablet Take 1 tablet (4 mg total) by mouth every 8 (eight) hours as needed for nausea or vomiting. 10 tablet 0   pantoprazole  (PROTONIX ) 40 MG tablet Take 1 tablet (40 mg total) by mouth daily. 90 tablet 3   topiramate  (TOPAMAX ) 25 MG tablet Take 1 tablet (25 mg total) by mouth 2  (two) times daily. 120 tablet 3   Vitamin D , Ergocalciferol , (DRISDOL ) 1.25 MG (50000 UNIT) CAPS capsule Take 1 capsule (50,000 Units total) by mouth every 14 (fourteen) days. 6 capsule 3   No current facility-administered medications on file prior to visit.    Allergies:   Allergies  Allergen Reactions   Abilify  [Aripiprazole ] Nausea And Vomiting   Amoxil [Amoxicillin] Diarrhea   Promethazine  Hcl Other (See Comments)    Made pt feel weird   Seroquel [Quetiapine Fumarate] Other (See Comments)    Pt does not recall reaction    Physical Exam There were no vitals filed for this visit. There is no height or weight on file to calculate BMI.   General: well developed, well nourished middle-aged Caucasian lady, seated, in no evident distress Head: head normocephalic and atraumatic.  Neck: supple with no carotid or supraclavicular bruits Cardiovascular: regular rate and rhythm, no murmurs Musculoskeletal: no deformity mild tenderness in the left occipital region in the midline. Skin:  no rash/petichiae Vascular:  Normal pulses all extremities  Neurologic Exam Mental Status: Awake and fully alert. Oriented to place and time. Recent and remote memory intact. Attention span, concentration and fund of knowledge appropriate. Mood and affect appropriate.  Cranial Nerves: Fundoscopic exam not done. Pupils equal, briskly reactive to light. Extraocular movements full without nystagmus. Visual fields full to confrontation.  Hearing intact. Facial sensation intact. Face, tongue, palate moves normally and symmetrically.  Motor: Normal bulk and tone. Normal strength in all tested extremity muscles. Sensory.: intact to touch ,pinprick .position and vibratory sensation.  Coordination: Intact finger-to-nose and medial coordination bilaterally. Gait and Station: Arises from chair without difficulty. Stance is normal. Gait demonstrates normal stride length and balance .  Unable to do tandem walking.     Reflexes: 1+ and symmetric. Toes downgoing.       ASSESSMENT: 63 year old Caucasian lady with recurrent episodes of complicated migraine as well as vascular headaches which likely represent mixture of atypical migraine with analgesic rebound headaches.  History of basilar tip aneurysm treated with stent assisted coiling in October 2022 with perioperative small right cerebellar and thalamic infarcts in November 2022. Prior episode of dizziness with leaning to the left in December 2022 of unclear etiology as to posterior circulation TIA due to right P2 stenosis versus complicated migraine.  Syncopal episode 05/2023 with fecal incontinence of unclear etiology.    PLAN:  Syncopal episode Recommend completion of EEG to evaluate for seizures MRI brain 07/11/2023 no acute abnormality, chronic infarcts noted  Complicated migraine Prevention: continue topiramate  Rescue: ***  Hx of TIA and stroke Continue aspirin  81 mg daily Continue close PCP follow-up for aggressive stroke (management  Cerebral aneurysm s/p coil Routinely followed by neurosurgery       I personally spent a total of *** minutes in the care of the patient today including {Time Based Coding:210964241}.  Johny Nap, AGNP-BC  [facility] [address] [place], [place] [address]  Phone [phone] Fax [phone] Note: This document was prepared with digital dictation and possible smart phrase technology. Any transcriptional errors that result from this process are unintentional.

## 2023-07-19 ENCOUNTER — Ambulatory Visit: Admitting: Adult Health

## 2023-07-22 ENCOUNTER — Other Ambulatory Visit: Payer: Self-pay | Admitting: Family Medicine

## 2023-07-26 ENCOUNTER — Institutional Professional Consult (permissible substitution): Admitting: Neurology

## 2023-08-13 ENCOUNTER — Ambulatory Visit: Payer: 59 | Admitting: Adult Health

## 2023-08-14 ENCOUNTER — Ambulatory Visit: Admitting: Adult Health

## 2023-08-27 ENCOUNTER — Ambulatory Visit: Admitting: Obstetrics and Gynecology

## 2023-08-30 NOTE — Progress Notes (Unsigned)
 GYNECOLOGY  VISIT   HPI: 63 y.o.   Single  Caucasian female   G2P0020 with Patient's last menstrual period was 01/31/2007.   here for: Here to talk about HRT.  Doesn't have any sex drive. Anxiety and depression, on medication.    Dealing with depression and anxiety, which she states is not well controlled. Added Lexapro  recently to Pristiq  through PCP.  States that medications only work temporarily for her.   Notes decreased sex drive since April, which may correlated with her medication change. She states prior to this, her libido was good.   She has used Wellbutrin  in the past and thinks it stopped working for her.  Does weekly therapy with a grief counselor.    GYNECOLOGIC HISTORY: Patient's last menstrual period was 01/31/2007. Contraception:  Post-menopausal Menopausal hormone therapy:  NA Last 2 paps:  03/16/21-Atypical squamos cells of undetermined significance (ASC-US ), HR HPV. Neg. 02/20/18- Neg. HPV- Neg.  History of abnormal Pap or positive HPV:  yes Mammogram:  08/25/22- BAC NEG.         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 11/17/2020   Family history of heart disease 09/03/2019   Osteoporosis 06/12/2019   Elevated IgE level 09/12/2017   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) 09/12/2017   B12 deficiency 02/15/2015   Headache, migraine 02/15/2015   Vitamin D  deficiency 10/19/2014   Major depressive disorder, recurrent episode, mild (HCC)  09/02/2014   Hyperlipidemia 08/18/2014   Hypothyroidism 08/18/2014    Past Medical History:  Diagnosis Date   Allergy     Anxiety    Arthritis    knees and spine, shoulder   Asthma    Bronchitis    hx - recurrent   Complication of anesthesia    waking up is not easy   Depression    Elevated IgE level 09/12/2017   09/12/2017 IgE 195   GERD (gastroesophageal reflux disease)    History of COVID-19 12/30/2020   Hx of irritable bowel syndrome    x2   Hyperlipidemia    diet controlled - no medication   Hypertension    not taking any meds at present - under control per patient   Hypokalemia    with PNA admission (2.5)   Hypothyroidism (acquired)    Ischemic cerebrovascular accident (CVA) (HCC)    Migraines    Osteoporosis    Pneumonia    4 episodes; hosp. admission 2014   PONV (postoperative nausea and vomiting)    TIA (transient ischemic attack) 12/17/2020   UC (ulcerative colitis) [facility])    DX'D 2021    Past Surgical History:  Procedure Laterality Date   ANEURYSM COILING     STENT   BREAST SURGERY  implants, then had them removed   COLONOSCOPY     greater 10 yrs ago - ? [facility]-2017 LAST   DILATION AND CURETTAGE OF UTERUS     IR ANGIO INTRA EXTRACRAN SEL INTERNAL CAROTID BILAT MOD SED  11/19/2020   IR ANGIO VERTEBRAL SEL VERTEBRAL BILAT MOD SED  11/19/2020   IR ANGIO VERTEBRAL SEL VERTEBRAL UNI L MOD SED  02/25/2021   IR ANGIO VERTEBRAL SEL VERTEBRAL UNI L MOD SED  01/13/2022   IR ANGIO VERTEBRAL SEL VERTEBRAL UNI L MOD SED  12/22/2022   IR ANGIOGRAM FOLLOW UP STUDY  11/19/2020   IR ANGIOGRAM FOLLOW UP STUDY  11/19/2020   IR ANGIOGRAM FOLLOW UP STUDY  11/19/2020   IR ANGIOGRAM FOLLOW UP STUDY  11/19/2020   IR ANGIOGRAM FOLLOW UP STUDY  11/19/2020   IR ANGIOGRAM FOLLOW UP STUDY  11/19/2020   IR ANGIOGRAM FOLLOW UP STUDY  11/19/2020   IR ANGIOGRAM FOLLOW UP STUDY  11/19/2020   IR INTRA CRAN STENT  11/19/2020   IR TRANSCATH/EMBOLIZ  11/19/2020    LAPAROSCOPIC ABDOMINAL EXPLORATION  01/31/1992   endometriosis   ORIF HUMERUS FRACTURE Left 04/01/2013   DR VERNETTA - shoulder   ORIF HUMERUS FRACTURE Left 04/01/2013   Procedure: OPEN REDUCTION INTERNAL FIXATION (ORIF) LEFT PROXIMAL HUMERUS FRACTURE;  Surgeon: Lonni CINDERELLA VERNETTA, MD;  Location: [facility] OR;  Service: Orthopedics;  Laterality: Left;   RADIOLOGY WITH ANESTHESIA N/A 11/19/2020   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;  Surgeon: Lanis Pupa, MD;  Location: [facility] OR;  Service: Radiology;  Laterality: N/A;   TONSILLECTOMY AND ADENOIDECTOMY      Current Outpatient Medications  Medication Sig Dispense Refill   amLODipine  (NORVASC ) 10 MG tablet Take 1 tablet (10 mg total) by mouth daily. 90 tablet 1   aspirin  EC 81 MG tablet Take 81 mg by mouth daily. Swallow whole.     atorvastatin  (LIPITOR ) 80 MG tablet Take 1 tablet (80 mg total) by mouth daily. 90 tablet 1   Budeson-Glycopyrrol-Formoterol  (BREZTRI  AEROSPHERE) 160-9-4.8 MCG/ACT AERO Inhale 2 puffs into the lungs in the morning and at bedtime. 5.9 g 1   Cholecalciferol  (VITAMIN D -3 PO) Take 1 capsule by mouth daily.     clonazePAM  (KLONOPIN ) 0.5 MG tablet Take 1 tablet (0.5 mg total) by mouth 2 (two) times daily as needed for anxiety. 180 tablet 1   clopidogrel  (PLAVIX ) 75 MG tablet Take 1 tablet (75 mg total) by mouth daily. 90 tablet 1   Cyanocobalamin  (B-12) 1000 MCG SUBL Place 1,000 mcg under the tongue daily. 90 tablet 3   desvenlafaxine  (PRISTIQ ) 50 MG 24 hr tablet Take 1 tablet (50 mg total) by mouth daily. 90 tablet 1   escitalopram  (LEXAPRO ) 10 MG tablet Take 1 tablet (10 mg total) by mouth daily. 90 tablet 1   levothyroxine  (SYNTHROID ) 150 MCG tablet Take 1 tablet (150 mcg total) by mouth daily before breakfast. 90 tablet 1   ondansetron  (ZOFRAN -ODT) 4 MG disintegrating tablet Take 1 tablet (4 mg total) by mouth every 8 (eight) hours as needed for nausea or vomiting. 10 tablet 0   pantoprazole   (PROTONIX ) 40 MG tablet Take 1 tablet (40 mg total) by mouth daily. 90 tablet 3   Vitamin D , Ergocalciferol , (DRISDOL ) 1.25 MG (50000 UNIT) CAPS capsule TAKE 1 CAPSULE (50,000 UNITS TOTAL) BY MOUTH EVERY 14 DAYS 6 capsule 1   alendronate  (FOSAMAX ) 70 MG tablet TAKE 1 TABLET (70 MG TOTAL) BY MOUTH EVERY 7 DAYS WITH FULL GLASS  WATER ON EMPTY STOMACH (Patient not taking: Reported on 08/31/2023)     ezetimibe  (ZETIA ) 10 MG tablet Take 1 tablet (10 mg total) by mouth every evening. (Patient not taking: Reported on 08/31/2023) 90 tablet 3   folic acid  (FOLVITE ) 1 MG tablet Take 1 tablet (1 mg total) by mouth daily. (Patient not taking: Reported on 08/31/2023) 90 tablet 1   Multiple Vitamins-Minerals (ZINC PO) Take by mouth daily. (Patient not taking: Reported on 08/31/2023)     topiramate  (TOPAMAX ) 25 MG tablet Take 1 tablet (25 mg total) by mouth 2 (two) times daily. (Patient not taking: Reported on 08/31/2023) 120 tablet 3   No current facility-administered medications for this visit.     ALLERGIES: Abilify  [aripiprazole ], Amoxil [amoxicillin], Promethazine  hcl, and Seroquel [quetiapine fumarate]  Family History  Problem Relation Age of Onset   Breast cancer Mother    Diabetes Mother    Heart disease Mother    Diabetes Father    Dementia Father    Colon cancer Father    Stroke Paternal Grandmother    Diabetes Paternal Grandmother    Colon polyps Neg Hx    Esophageal cancer Neg Hx    Stomach cancer Neg Hx    Rectal cancer Neg Hx    Crohn's disease Neg Hx    Ulcerative colitis Neg Hx     Social History   Socioeconomic History   Marital status: Single    Spouse name: Not on file   Number of children: 0   Years of education: Not on file   Highest education level: Bachelor's degree (e.g., BA, AB, BS)  Occupational History   Occupation: Geologist, engineering  Tobacco Use   Smoking status: Never    Passive exposure: Never   Smokeless tobacco: Never  Vaping Use   Vaping status: Never Used   Substance and Sexual Activity   Alcohol use: Yes    Alcohol/week: 1.0 standard drink of alcohol    Types: 1 Glasses of wine per week    Comment: SOCIALLY   Drug use: No   Sexual activity: Yes    Partners: Male    Birth control/protection: Post-menopausal  Other Topics Concern   Not on file  Social History Narrative   She is originally from [place]. She has traveled to [facility], [place], [place] , [place], [place], [place], [place], [place], [place] ,& [place]. No international travel. She has dogs. No prior bird, mold, or recent hot tub exposure. She hasn't used her hot tub in 1.5 years. 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 Previously enjoyed gardening and playing tennis. Helps to care for her mother.   Social Drivers of Corporate investment banker Strain: Low Risk  (10/09/2022)   Overall Financial Resource Strain (CARDIA)    Difficulty of Paying Living Expenses: Not hard at all  Food Insecurity: No Food Insecurity (10/09/2022)   Hunger Vital Sign    Worried About Running Out of Food in the Last Year: Never true    Ran Out of Food in the Last Year: Never true  Transportation Needs: No Transportation Needs (10/09/2022)   PRAPARE - Administrator, Civil Service (Medical): No    Lack of Transportation (Non-Medical): No  Physical Activity: Insufficiently Active (10/09/2022)   Exercise Vital Sign    Days of Exercise per Week: 1 day    Minutes of Exercise per Session: 20 min  Stress: Stress Concern Present (10/09/2022)   [employer]  of Occupational Health - Occupational Stress Questionnaire    Feeling of Stress : Very much  Social Connections: Moderately Isolated (10/09/2022)   Social Connection and Isolation Panel    Frequency of Communication with Friends and Family: More than three times a week    Frequency of Social Gatherings with Friends and Family: Twice a week    Attends Religious Services: 1 to 4 times per year    Active Member of [employer] or Organizations: No     Attends Engineer, structural: Not on file    Marital Status: Divorced  Intimate Partner Violence: Not At Risk (01/05/2022)   Humiliation, Afraid, Rape, and Kick questionnaire    Fear of Current or Ex-Partner: No    Emotionally Abused: No    Physically Abused: No    Sexually Abused: No    Review of Systems  PHYSICAL EXAMINATION:   BP 130/80   Pulse 79   Ht 5' 5.35 (1.66 m)   Wt 174 lb 6.4 oz (79.1 kg)   LMP 01/31/2007   SpO2 98%   BMI 28.71 kg/m     General appearance: alert, cooperative and appears stated age   ASSESSMENT:  Decreased libido.  Depression and anxiety, not well controlled per patient.  Hx TIA/CVA/hemiplegic migraine.  FH breast cancer. Bereavement.   PLAN:  We discussed decreased libido may be medication induced and that underlying depression and anxiety can also play a role in this.  Wellbutrin  discussed as a possible treatment option for decreased libido and depression/anxiety.  She will see her PCP and discuss potential referral to psychiatry for medication management.  I do not recommend HRT or testosterone treatment due to her hx of TIA/CVA.  Follow up for annual exam and prn.   25 min  total time was spent for this patient encounter, including preparation, face-to-face counseling with the patient, coordination of care, and documentation of the encounter.

## 2023-08-31 ENCOUNTER — Encounter: Payer: Self-pay | Admitting: Obstetrics and Gynecology

## 2023-08-31 ENCOUNTER — Ambulatory Visit (INDEPENDENT_AMBULATORY_CARE_PROVIDER_SITE_OTHER)

## 2023-08-31 ENCOUNTER — Ambulatory Visit: Admitting: Obstetrics and Gynecology

## 2023-08-31 VITALS — BP 130/80 | HR 79 | Ht 65.35 in | Wt 174.4 lb

## 2023-08-31 DIAGNOSIS — E538 Deficiency of other specified B group vitamins: Secondary | ICD-10-CM

## 2023-08-31 DIAGNOSIS — R6882 Decreased libido: Secondary | ICD-10-CM | POA: Diagnosis not present

## 2023-08-31 DIAGNOSIS — F419 Anxiety disorder, unspecified: Secondary | ICD-10-CM

## 2023-08-31 DIAGNOSIS — F32A Depression, unspecified: Secondary | ICD-10-CM | POA: Diagnosis not present

## 2023-08-31 MED ORDER — CYANOCOBALAMIN 1000 MCG/ML IJ SOLN
1000.0000 ug | Freq: Once | INTRAMUSCULAR | Status: AC
  Administered 2023-08-31: 1000 ug via INTRAMUSCULAR

## 2023-08-31 NOTE — Patient Instructions (Signed)
 Please contact Dr. Kuneff to discuss possible medication change.  Referral to a psychiatrist may also be helpful for medication assessment and prescribing.   Bupropion  Tablets (Depression/Mood Disorders) What is this medication? BUPROPION  (byoo PROE pee on) treats depression. It increases norepinephrine and dopamine in the brain, hormones that help regulate mood. It belongs to a group of medications called NDRIs. This medicine may be used for other purposes; ask your health care provider or pharmacist if you have questions. COMMON BRAND NAME(S): Wellbutrin  What should I tell my care team before I take this medication? They need to know if you have any of these conditions: An eating disorder, such as anorexia or bulimia Bipolar disorder or psychosis Diabetes or high blood sugar, treated with medication Glaucoma Head injury or brain tumor Heart disease, previous heart attack, or irregular heart beat High blood pressure Kidney disease Liver disease Seizures Suicidal thoughts, plans, or attempt by you or a family member Tourette syndrome Weight loss An unusual or allergic reaction to bupropion , other medications, foods, dyes, or preservatives Pregnant or trying to become pregnant Breastfeeding How should I use this medication? Take this medication by mouth with a glass of water. Follow the directions on the prescription label. You can take it with or without food. If it upsets your stomach, take it with food. Take your medication at regular intervals. Do not take your medication more often than directed. Do not stop taking this medication suddenly except upon the advice of your care team. Stopping this medication too quickly may cause serious side effects or your condition may worsen. A special MedGuide will be given to you by the pharmacist with each prescription and refill. Be sure to read this information carefully each time. Talk to your care team regarding the use of this medication in  children. Special care may be needed. Overdosage: If you think you have taken too much of this medicine contact a poison control center or emergency room at once. NOTE: This medicine is only for you. Do not share this medicine with others. What if I miss a dose? If you miss a dose, take it as soon as you can. If it is less than four hours to your next dose, take only that dose and skip the missed dose. Do not take double or extra doses. What may interact with this medication? Do not take this medication with any of the following: Linezolid MAOIs, such as Azilect, Carbex, Eldepryl, Marplan, Nardil, and Parnate Methylene blue (injected into a vein) Other medications that contain bupropion , such as Zyban  This medication may also interact with the following: Alcohol Certain medications for anxiety or sleep Certain medications for blood pressure, such as metoprolol , propranolol Certain medications for HIV or AIDS, such as efavirenz, lopinavir, nelfinavir, ritonavir Certain medications for irregular heartbeat, such as propafenone, flecainide Certain medications for mental health conditions Certain medications for Parkinson disease, such as amantadine, levodopa Certain medications for seizures, such as carbamazepine, phenytoin, phenobarbital Cimetidine Clopidogrel  Cyclophosphamide Digoxin Furazolidone Isoniazid  Nicotine Orphenadrine Procarbazine Steroid medications, such as prednisone  or cortisone Stimulant medications for attention disorders, weight loss, or to stay awake Tamoxifen Theophylline Thiotepa Ticlopidine Tramadol Warfarin This list may not describe all possible interactions. Give your health care provider a list of all the medicines, herbs, non-prescription drugs, or dietary supplements you use. Also tell them if you smoke, drink alcohol, or use illegal drugs. Some items may interact with your medicine. What should I watch for while using this medication? Tell your care  team if  your symptoms do not get better or if they get worse. Visit your care team for regular checks on your progress. Because it may take several weeks to see the full effects of this medication, it is important to continue your treatment as prescribed. This medication may cause thoughts of suicide or depression. This includes sudden changes in mood, behaviors, or thoughts. These changes can happen at any time but are more common in the beginning of treatment or after a change in dose. Call your care team right away if you experience these thoughts or worsening depression. This medication may cause mood and behavior changes, such as anxiety, nervousness, irritability, hostility, restlessness, excitability, hyperactivity, or trouble sleeping. These changes can happen at any time but are more common in the beginning of treatment or after a change in dose. Call your care team right away if you notice any of these symptoms. This medication may cause serious skin reactions. They can happen weeks to months after starting the medication. Contact your care team right away if you notice fevers or flu-like symptoms with a rash. The rash may be red or purple and then turn into blisters or peeling of the skin. You may also notice a red rash with swelling of the face, lips, or lymph nodes in your neck or under your arms. Avoid drinks that contain alcohol while taking this medication. Drinking large amounts of alcohol, using sleeping or anxiety medications, or quickly stopping the use of these agents while taking this medication may increase your risk for a seizure. This medication may affect your coordination, reaction time, or judgment. Do not drive or operate machinery until you know how this medication affects you. Do not take this medication close to bedtime. It may prevent you from sleeping. Your mouth may get dry. Chewing sugarless gum or sucking hard candy and drinking plenty of water may help. Contact your care  team if the problem does not go away or is severe. What side effects may I notice from receiving this medication? Side effects that you should report to your care team as soon as possible: Allergic reactions--skin rash, itching, hives, swelling of the face, lips, tongue, or throat Increase in blood pressure Mood and behavior changes--anxiety, nervousness, confusion, hallucinations, irritability, hostility, thoughts of suicide or self-harm, worsening mood, feelings of depression Redness, blistering, peeling, or loosening of the skin, including inside the mouth Seizures Sudden eye pain or change in vision such as blurry vision, seeing halos around lights, vision loss Side effects that usually do not require medical attention (report to your care team if they continue or are bothersome): Constipation Dizziness Dry mouth Loss of appetite Nausea Tremors or shaking Trouble sleeping This list may not describe all possible side effects. Call your doctor for medical advice about side effects. You may report side effects to FDA at 1-800-FDA-1088. Where should I keep my medication? Keep out of the reach of children and pets. Store at room temperature between 20 and 25 degrees C (68 and 77 degrees F), away from direct sunlight and moisture. Keep tightly closed. Throw away any unused medication after the expiration date. NOTE: This sheet is a summary. It may not cover all possible information. If you have questions about this medicine, talk to your doctor, pharmacist, or health care provider.  2024 Elsevier/Gold Standard (2021-10-09 00:00:00)

## 2023-08-31 NOTE — Progress Notes (Signed)
 Pt here for monthly B12 injection per Dr.Kuneff   B12 1000mcg given IM right deltoid, and pt tolerated injection well.   Next B12 injection scheduled for 1 month.  Please sign in absence of PCP

## 2023-09-03 ENCOUNTER — Ambulatory Visit (INDEPENDENT_AMBULATORY_CARE_PROVIDER_SITE_OTHER): Admitting: Family Medicine

## 2023-09-03 DIAGNOSIS — Z91199 Patient's noncompliance with other medical treatment and regimen due to unspecified reason: Secondary | ICD-10-CM

## 2023-09-03 NOTE — Progress Notes (Signed)
 No show

## 2023-09-05 ENCOUNTER — Encounter: Payer: Self-pay | Admitting: Family Medicine

## 2023-09-18 LAB — HM MAMMOGRAPHY

## 2023-09-18 LAB — HM DEXA SCAN

## 2023-09-19 ENCOUNTER — Encounter: Payer: Self-pay | Admitting: Obstetrics and Gynecology

## 2023-09-19 ENCOUNTER — Ambulatory Visit (INDEPENDENT_AMBULATORY_CARE_PROVIDER_SITE_OTHER): Admitting: Obstetrics and Gynecology

## 2023-09-19 VITALS — BP 122/84 | HR 80 | Ht 65.75 in | Wt 173.0 lb

## 2023-09-19 DIAGNOSIS — Z1331 Encounter for screening for depression: Secondary | ICD-10-CM | POA: Diagnosis not present

## 2023-09-19 DIAGNOSIS — Z01419 Encounter for gynecological examination (general) (routine) without abnormal findings: Secondary | ICD-10-CM

## 2023-09-19 NOTE — Patient Instructions (Addendum)
 Calcium in Foods Calcium is a mineral in the body. Of all the minerals in your body, you have the most calcium. Most of the body's calcium supply is stored in bones and teeth. Calcium helps many parts of the body work, including: Blood and blood vessels. Nerves. Hormones. Muscles. Bones and teeth. When your calcium stores are low, you may be at risk for low bone mass, bone loss, and broken bones. When you get enough calcium, it helps to support strong bones and teeth throughout your life. Calcium is especially important for: Children during growth spurts. Females during adolescence. Females who are pregnant or breastfeeding. Females after their menstrual cycle stops (postmenopausal). Females whose menstrual cycle has stopped because of an eating disorder or regular intense exercise. People who can't eat or digest dairy products. People who eat a vegan diet. Recommended daily amounts of calcium: Females (ages 87 to 55): 1,000 mg per day. Females (ages 35 and older): 1,200 mg per day. Males (ages 53 to 45): 1,000 mg per day. Males (ages 43 and older): 1,200 mg per day. Females (ages 88 to 28): 1,300 mg per day. Males (ages 41 to 56): 1,300 mg per day. General information Eat foods that are high in calcium. Try to get most of your calcium from food. Some people may benefit from taking calcium supplements. Check with your health care provider or an expert in healthy eating called a dietitian before starting any calcium supplements. Calcium supplements may interact with certain medicines. Too much calcium may cause other health problems, such as trouble pooping and kidney stones. For the body to absorb calcium, it needs vitamin D. Sources of vitamin D include: Skin exposure to direct sunlight. Foods, such as egg yolks, liver, mushrooms, saltwater fish, and fortified milk. Vitamin D supplements. Check with your provider or dietitian before starting any vitamin D supplements. The amount of  calcium that is absorbed in the body varies with type of food. Talk to a dietitian about what foods are best for you, especially if you are eat a vegan diet or don't eat dairy. What foods are high in calcium?  Foods that are high in calcium contain more than 100 milligrams per serving. Fruits Fortified orange juice or other fruit juice, 300 mg per 8 oz (237 mL) serving. Vegetables Collard greens, 260 mg per 1 cup (130 g) serving, cooked. Kale, 180 mg per 1 cup (118 g) serving, cooked. Bok choy, 180 mg per 1 cup (170 g) serving, cooked Grains Fortified frozen waffles, 200 mg in 2 waffles. Oatmeal, 180 mg in 1 cup (234 g) serving, cooked. Fortified white bread, 175 mg per slice. Meats and other proteins Sardines, canned with bones, 350 mg per 3.75 oz (92 g) serving. Salmon, canned with bones, 168 mg per 3 oz (85 g) serving. Canned shrimp, 125 mg per 3 oz (85 g) serving. Baked beans, 120 mg per 1 cup (266 g) serving. Tofu, firm, made with calcium sulfate, 861 mg per  cup (126 g) serving. Dairy Yogurt, plain, low-fat, 448 mg per 1 cup (245 g) serving Nonfat milk, 300 mg per 1 cup (245 g) serving. American cheese, 145 mg per 1 oz (21 g) serving or 1 slice. Cheddar cheese, 200 mg per 1 oz (28 g) serving or 1 slice. Cottage cheese 2%, 125 mg per  cup (113 g) serving. Fortified soy, rice, or almond milk, 300 mg per 1 cup (237 mL) serving. Mozzarella, part skim, 210 mg per 1 oz (21 g) serving. The items listed  above may not be a complete list of foods high in calcium. Actual amounts of calcium may be different depending on processing. Contact a dietitian for more information. What foods are lower in calcium? Foods that are lower in calcium contain 50 mg or less per serving. Fruits Apple, 1 medium, about 6 mg. Banana, 1 medium, about 12 mg. Vegetables Lettuce, 19 mg per 1 cup (35 g) serving. Tomato, 1 small, about 11 mg. Grains Rice, white, 8 mg per  cup (79 g) serving. Boiled  potatoes, 14 mg per 1 cup (160 g) serving. White bread, 6 mg per slice. Meats and other proteins Egg, 24 mg per 1 egg (50 g). Red meat, 7 mg per 4 oz (80 g) serving. Chicken, 17 mg per 4 oz (113 g) serving. Fish, cod, or trout, 20 mg per 4 oz (140 g) serving. Dairy Cream cheese, regular, 14 mg per 1 Tbsp (15 g) serving. Brie cheese, 50 mg per 1 oz (32 g) serving. The items listed above may not be a complete list of foods lower in calcium. Actual amounts of calcium may be different depending on processing. Contact a dietitian for more information. This information is not intended to replace advice given to you by your health care provider. Make sure you discuss any questions you have with your health care provider. Document Revised: 10/14/2022 Document Reviewed: 10/14/2022 Elsevier Patient Education  2024 [employer] Inc.  EXERCISE AND DIET:  We recommended that you start or continue a regular exercise program for good health. Regular exercise means any activity that makes your heart beat faster and makes you sweat.  We recommend exercising at least 30 minutes per day at least 3 days a week, preferably 4 or 5.  We also recommend a diet low in fat and sugar.  Inactivity, poor dietary choices and obesity can cause diabetes, heart attack, stroke, and kidney damage, among others.    ALCOHOL AND SMOKING:  Women should limit their alcohol intake to no more than 7 drinks/beers/glasses of wine (combined, not each!) per week. Moderation of alcohol intake to this level decreases your risk of breast cancer and liver damage. And of course, no recreational drugs are part of a healthy lifestyle.  And absolutely no smoking or even second hand smoke. Most people know smoking can cause heart and lung diseases, but did you know it also contributes to weakening of your bones? Aging of your skin?  Yellowing of your teeth and nails?  CALCIUM AND VITAMIN D:  Adequate intake of calcium and Vitamin D are recommended.  The  recommendations for exact amounts of these supplements seem to change often, but generally speaking 600 mg of calcium (either carbonate or citrate) and 800 units of Vitamin D per day seems prudent. Certain women may benefit from higher intake of Vitamin D.  If you are among these women, your doctor will have told you during your visit.    PAP SMEARS:  Pap smears, to check for cervical cancer or precancers,  have traditionally been done yearly, although recent scientific advances have shown that most women can have pap smears less often.  However, every woman still should have a physical exam from her gynecologist every year. It will include a breast check, inspection of the vulva and vagina to check for abnormal growths or skin changes, a visual exam of the cervix, and then an exam to evaluate the size and shape of the uterus and ovaries.  And after 61 years of age, a rectal exam  is indicated to check for rectal cancers. We will also provide age appropriate advice regarding health maintenance, like when you should have certain vaccines, screening for sexually transmitted diseases, bone density testing, colonoscopy, mammograms, etc.   MAMMOGRAMS:  All women over 11 years old should have a yearly mammogram. Many facilities now offer a "3D" mammogram, which may cost around $50 extra out of pocket. If possible,  we recommend you accept the option to have the 3D mammogram performed.  It both reduces the number of women who will be called back for extra views which then turn out to be normal, and it is better than the routine mammogram at detecting truly abnormal areas.    COLONOSCOPY:  Colonoscopy to screen for colon cancer is recommended for all women at age 8.  We know, you hate the idea of the prep.  We agree, BUT, having colon cancer and not knowing it is worse!!  Colon cancer so often starts as a polyp that can be seen and removed at colonscopy, which can quite literally save your life!  And if your first  colonoscopy is normal and you have no family history of colon cancer, most women don't have to have it again for 10 years.  Once every ten years, you can do something that may end up saving your life, right?  We will be happy to help you get it scheduled when you are ready.  Be sure to check your insurance coverage so you understand how much it will cost.  It may be covered as a preventative service at no cost, but you should check your particular policy.

## 2023-09-19 NOTE — Progress Notes (Signed)
 63 y.o. [id number] Single Caucasian female here for annual exam.    No vaginal bleeding, discharge, bladder or bowel concerns.   Seeing her therapist.  Is now starting Wellbutrin  and weaning off Pristiq  and Lexapro . PCP prescribing.   PCP: Catherine Fuller A, DO   Patient's last menstrual period was 01/31/2007.           Sexually active: Yes.    The current method of family planning is post menopausal status.    Menopausal hormone therapy:  n/a Exercising: Yes.    Walking Smoker:  no  OB History  Gravida Para Term Preterm AB Living  2 0 0 0 2 0  SAB IAB Ectopic Multiple Live Births  2        # Outcome Date GA Lbr Len/2nd Weight Sex Type Anes PTL Lv  2 SAB           1 SAB              HEALTH MAINTENANCE: Last 2 paps:  03/16/21 ASCUS, HR HPV neg, 02/20/18 neg, HPV neg  History of abnormal Pap or positive HPV:  yes Mammogram:   08/25/22 Breast Density Cat A, BIRADS Cat 1 neg. Mammogram done yesterday. Colonoscopy:  07/12/22 - due in 2027. Bone Density:  12/08/19  Result  osteoporosis.  [facility].   Immunization History  Administered Date(s) Administered   Influenza Inj Mdck Quad Pf 10/26/2018   Influenza, Seasonal, Injecte, Preservative Fre 10/13/2022   Influenza,inj,Quad PF,6+ Mos 10/19/2014, 10/01/2018, 12/29/2019, 09/16/2021   Influenza,inj,quad, With Preservative 11/03/2016, 11/18/2017   Influenza-Unspecified 11/02/2016   Moderna Sars-Covid-2 Vaccination 04/07/2019, 05/13/2019   PFIZER(Purple Top)SARS-COV-2 Vaccination 01/02/2020   PNEUMOCOCCAL CONJUGATE-20 09/16/2021   PPD Test 01/14/2020   Pneumococcal Conjugate-13 11/18/2017   Tdap 01/30/2014   Zoster Recombinant(Shingrix) 06/18/2019, 09/03/2019      reports that she has never smoked. She has never been exposed to tobacco smoke. She has never used smokeless tobacco. She reports current alcohol use of about 1.0 standard drink of alcohol per week. She reports that she does not use drugs.  Past  Medical History:  Diagnosis Date   Allergy     Anxiety    Arthritis    knees and spine, shoulder   Asthma    Bronchitis    hx - recurrent   Complication of anesthesia    waking up is not easy   Depression    Elevated IgE level 09/12/2017   09/12/2017 IgE 195   GERD (gastroesophageal reflux disease)    History of COVID-19 12/30/2020   Hx of irritable bowel syndrome    x2   Hyperlipidemia    diet controlled - no medication   Hypertension    not taking any meds at present - under control per patient   Hypokalemia    with PNA admission (2.5)   Hypothyroidism (acquired)    Ischemic cerebrovascular accident (CVA) (HCC)    Migraines    Osteoporosis    Pneumonia    4 episodes; hosp. admission 2014   PONV (postoperative nausea and vomiting)    TIA (transient ischemic attack) 12/17/2020   UC (ulcerative colitis) [facility])    DX'D 2021    Past Surgical History:  Procedure Laterality Date   ANEURYSM COILING     STENT   BREAST SURGERY     implants, then had them removed   COLONOSCOPY     greater 10 yrs ago - ? [facility]-2017 LAST   DILATION AND  CURETTAGE OF UTERUS     IR ANGIO INTRA EXTRACRAN SEL INTERNAL CAROTID BILAT MOD SED  11/19/2020   IR ANGIO VERTEBRAL SEL VERTEBRAL BILAT MOD SED  11/19/2020   IR ANGIO VERTEBRAL SEL VERTEBRAL UNI L MOD SED  02/25/2021   IR ANGIO VERTEBRAL SEL VERTEBRAL UNI L MOD SED  01/13/2022   IR ANGIO VERTEBRAL SEL VERTEBRAL UNI L MOD SED  12/22/2022   IR ANGIOGRAM FOLLOW UP STUDY  11/19/2020   IR ANGIOGRAM FOLLOW UP STUDY  11/19/2020   IR ANGIOGRAM FOLLOW UP STUDY  11/19/2020   IR ANGIOGRAM FOLLOW UP STUDY  11/19/2020   IR ANGIOGRAM FOLLOW UP STUDY  11/19/2020   IR ANGIOGRAM FOLLOW UP STUDY  11/19/2020   IR ANGIOGRAM FOLLOW UP STUDY  11/19/2020   IR ANGIOGRAM FOLLOW UP STUDY  11/19/2020   IR INTRA CRAN STENT  11/19/2020   IR TRANSCATH/EMBOLIZ  11/19/2020   LAPAROSCOPIC ABDOMINAL EXPLORATION  01/31/1992   endometriosis   ORIF HUMERUS  FRACTURE Left 04/01/2013   DR VERNETTA - shoulder   ORIF HUMERUS FRACTURE Left 04/01/2013   Procedure: OPEN REDUCTION INTERNAL FIXATION (ORIF) LEFT PROXIMAL HUMERUS FRACTURE;  Surgeon: Lonni CINDERELLA VERNETTA, MD;  Location: [facility] OR;  Service: Orthopedics;  Laterality: Left;   RADIOLOGY WITH ANESTHESIA N/A 11/19/2020   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 90 tablet 1   aspirin  EC 81 MG tablet Take 81 mg by mouth daily. Swallow whole.     atorvastatin  (LIPITOR ) 80 MG tablet Take 1 tablet (80 mg total) by mouth daily. 90 tablet 1   Budeson-Glycopyrrol-Formoterol  (BREZTRI  AEROSPHERE) 160-9-4.8 MCG/ACT AERO Inhale 2 puffs into the lungs in the morning and at bedtime. 5.9 g 1   buPROPion  (WELLBUTRIN  XL) 150 MG 24 hr tablet Take 150 mg by mouth daily.     Cholecalciferol  (VITAMIN D -3 PO) Take 1 capsule by mouth daily.     clonazePAM  (KLONOPIN ) 0.5 MG tablet Take 1 tablet (0.5 mg total) by mouth 2 (two) times daily as needed for anxiety. 180 tablet 1   clopidogrel  (PLAVIX ) 75 MG tablet Take 1 tablet (75 mg total) by mouth daily. 90 tablet 1   Cyanocobalamin  (B-12) 1000 MCG SUBL Place 1,000 mcg under the tongue daily. 90 tablet 3   desvenlafaxine  (PRISTIQ ) 50 MG 24 hr tablet Take 1 tablet (50 mg total) by mouth daily. (Patient taking differently: Take 25 mg by mouth daily.) 90 tablet 1   escitalopram  (LEXAPRO ) 10 MG tablet Take 1 tablet (10 mg total) by mouth daily. (Patient taking differently: Take 5 mg by mouth daily.) 90 tablet 1   levothyroxine  (SYNTHROID ) 150 MCG tablet Take 1 tablet (150 mcg total) by mouth daily before breakfast. 90 tablet 1   ondansetron  (ZOFRAN -ODT) 4 MG disintegrating tablet Take 1 tablet (4  mg total) by mouth every 8 (eight) hours as needed for nausea or vomiting. 10 tablet 0   pantoprazole  (PROTONIX ) 40 MG tablet Take 1 tablet (40 mg total) by mouth daily. 90 tablet 3   Vitamin D , Ergocalciferol , (DRISDOL ) 1.25 MG (50000 UNIT) CAPS capsule TAKE 1 CAPSULE (50,000 UNITS TOTAL) BY MOUTH EVERY 14 DAYS 6 capsule 1   alendronate  (FOSAMAX ) 70 MG tablet TAKE 1 TABLET (  70 MG TOTAL) BY MOUTH EVERY 7 DAYS WITH FULL GLASS WATER ON EMPTY STOMACH (Patient not taking: Reported on 09/19/2023)     ezetimibe  (ZETIA ) 10 MG tablet Take 1 tablet (10 mg total) by mouth every evening. (Patient not taking: Reported on 09/19/2023) 90 tablet 3   folic acid  (FOLVITE ) 1 MG tablet Take 1 tablet (1 mg total) by mouth daily. (Patient not taking: Reported on 09/19/2023) 90 tablet 1   Multiple Vitamins-Minerals (ZINC PO) Take by mouth daily. (Patient not taking: Reported on 09/19/2023)     topiramate  (TOPAMAX ) 25 MG tablet Take 1 tablet (25 mg total) by mouth 2 (two) times daily. (Patient not taking: Reported on 09/19/2023) 120 tablet 3   No current facility-administered medications for this visit.    ALLERGIES: Abilify  [aripiprazole ], Amoxil [amoxicillin], Promethazine  hcl, and Seroquel [quetiapine fumarate]  Family History  Problem Relation Age of Onset   Breast cancer Mother    Diabetes Mother    Heart disease Mother    Diabetes Father    Dementia Father    Colon cancer Father    Stroke Paternal Grandmother    Diabetes Paternal Grandmother    Colon polyps Neg Hx    Esophageal cancer Neg Hx    Stomach cancer Neg Hx    Rectal cancer Neg Hx    Crohn's disease Neg Hx    Ulcerative colitis Neg Hx     Review of Systems  All other systems reviewed and are negative.   PHYSICAL EXAM:  BP 122/84 (BP Location: Left Arm, Patient Position: Sitting)   Pulse 80   Ht 5' 5.75 (1.67 m)   Wt 173 lb (78.5 kg)   LMP 01/31/2007   SpO2 98%   BMI 28.14 kg/m     General appearance: alert, cooperative and appears  stated age Head: normocephalic, without obvious abnormality, atraumatic Neck: no adenopathy, supple, symmetrical, trachea midline and thyroid  normal to inspection and palpation Lungs: clear to auscultation bilaterally Breasts: normal appearance, no masses or tenderness, No nipple retraction or dimpling, No nipple discharge or bleeding, No axillary adenopathy Heart: regular rate and rhythm Abdomen: soft, non-tender; no masses, no organomegaly Extremities: extremities normal, atraumatic, no cyanosis or edema Skin: skin color, texture, turgor normal. No rashes or lesions Lymph nodes: cervical, supraclavicular, and axillary nodes normal. Neurologic: grossly normal  Pelvic: External genitalia:  no lesions              No abnormal inguinal nodes palpated.              Urethra:  normal appearing urethra with no masses, tenderness or lesions              Bartholins and Skenes: normal                 Vagina: normal appearing vagina with normal color and discharge, no lesions              Cervix: no lesions              Pap taken: no Bimanual Exam:  Uterus:  normal size, contour, position, consistency, mobility, non-tender              Adnexa: no mass, fullness, tenderness              Rectal exam: yes.  Confirms.              Anus:  normal sphincter tone, no lesions  Chaperone was present for exam:  Jada M,  CMA  ASSESSMENT: Well woman visit with gynecologic exam. Pap ASCUS, neg HR HPV 2023.  Depression and anxiety.  Starting new medication regimen. Hx TIA/CVA/hemiplegic migraine.  FH breast cancer. Hx fracture.  PHQ-2-9: 1  PLAN: Mammogram screening discussed. Self breast awareness reviewed. Pap and HRV collected:  no.  Due in 2026. Guidelines for Calcium , Vitamin D , regular exercise program including cardiovascular and weight bearing exercise. Medication refills:  NA Labs with PCP.  Will get copy of BMD and mammogram from [place].  Follow up:  yearly and prn.

## 2023-09-21 ENCOUNTER — Telehealth: Payer: Self-pay | Admitting: Family Medicine

## 2023-09-21 DIAGNOSIS — M810 Age-related osteoporosis without current pathological fracture: Secondary | ICD-10-CM

## 2023-09-21 NOTE — Telephone Encounter (Signed)
 Received mammogram and DEXA report from [place]. Please call patient Mammogram was normal Bone density resulted with decreased bone density in the osteopenic range in her hips, with a mild decrease from prior study.   Her lower back is in the osteoporosis range.  It was in the osteoporosis range in the past, but she did have additional decrease in density on this exam.  Should consider starting back on Fosamax  if/when able.  Osteopenia/osteoporosis based guidelines: 1.) Drink alcohol in moderation only:  no more than 2 drinks a day for women.  2.) Decrease caffeine consumption to no  More than 2.5 cups of coffee a day  3.) Exercise: weight bearing (walking counts), strength and balance training. 4.) No smoking.  5.) Sunlight/Ultraviolet light exposure 30 minutes a day/5 days a week. 6.) Vitamin D /calcium  supplementation

## 2023-09-23 ENCOUNTER — Other Ambulatory Visit: Payer: Self-pay | Admitting: Family Medicine

## 2023-09-27 ENCOUNTER — Ambulatory Visit

## 2023-09-27 DIAGNOSIS — E538 Deficiency of other specified B group vitamins: Secondary | ICD-10-CM

## 2023-09-27 MED ORDER — CYANOCOBALAMIN 1000 MCG/ML IJ SOLN
1000.0000 ug | Freq: Once | INTRAMUSCULAR | Status: AC
  Administered 2023-09-27: 1000 ug via INTRAMUSCULAR

## 2023-09-27 NOTE — Telephone Encounter (Signed)
 Patient would like detailed results, concerning score of dexa scan.  Please call patient at [phone], okay to leave VM.

## 2023-09-27 NOTE — Telephone Encounter (Signed)
 LM for pt to return call to discuss.

## 2023-09-27 NOTE — Progress Notes (Signed)
Pt here for monthly B12 injection per Dr.Kuneff  B12 1000mcg given IM right deltoid, and pt tolerated injection well.  Next B12 injection scheduled for 1 month.  

## 2023-09-27 NOTE — Telephone Encounter (Signed)
 Spoke with patient regarding results/recommendations.   Pt is requesting t-score states that [facility] will not accept her if she does not have a score number. Pt is requesting referral for PT for strength training

## 2023-09-28 ENCOUNTER — Ambulatory Visit

## 2023-10-02 ENCOUNTER — Encounter: Payer: Self-pay | Admitting: Family Medicine

## 2023-10-03 NOTE — Telephone Encounter (Signed)
 Please advise patient that I did place the referral she requested to agree to physical therapy concerning her osteoporosis/strength training.  I also received a second message patient wanting more details concerning her osteoporosis score.  Her full report can be viewed underneath her MyChart account under DEXA scan-that report will compare prior results for her as well. Briefly scores are the following: Right femur/hip.  -1.4, left -1.7 Lumbar -2.9  Major osteoporotic fracture risk 8.8%, hip 0.9%

## 2023-10-03 NOTE — Addendum Note (Signed)
 Addended by: Avaiyah Strubel A on: 10/03/2023 03:32 PM   Modules accepted: Orders

## 2023-10-04 LAB — LAB REPORT - SCANNED
A1c: 5.6
EGFR: 89
Free T4: 0.94 ng/dL
TSH: 4.7

## 2023-10-18 ENCOUNTER — Ambulatory Visit: Admitting: Family Medicine

## 2023-10-18 ENCOUNTER — Encounter: Payer: Self-pay | Admitting: Family Medicine

## 2023-10-18 VITALS — BP 124/82 | HR 79 | Temp 98.2°F | Wt 175.0 lb

## 2023-10-18 DIAGNOSIS — E538 Deficiency of other specified B group vitamins: Secondary | ICD-10-CM

## 2023-10-18 DIAGNOSIS — Z8673 Personal history of transient ischemic attack (TIA), and cerebral infarction without residual deficits: Secondary | ICD-10-CM

## 2023-10-18 DIAGNOSIS — F330 Major depressive disorder, recurrent, mild: Secondary | ICD-10-CM

## 2023-10-18 DIAGNOSIS — Z8249 Family history of ischemic heart disease and other diseases of the circulatory system: Secondary | ICD-10-CM

## 2023-10-18 DIAGNOSIS — M810 Age-related osteoporosis without current pathological fracture: Secondary | ICD-10-CM

## 2023-10-18 DIAGNOSIS — I725 Aneurysm of other precerebral arteries: Secondary | ICD-10-CM

## 2023-10-18 DIAGNOSIS — E559 Vitamin D deficiency, unspecified: Secondary | ICD-10-CM

## 2023-10-18 DIAGNOSIS — E034 Atrophy of thyroid (acquired): Secondary | ICD-10-CM

## 2023-10-18 DIAGNOSIS — I6389 Other cerebral infarction: Secondary | ICD-10-CM

## 2023-10-18 DIAGNOSIS — E782 Mixed hyperlipidemia: Secondary | ICD-10-CM

## 2023-10-18 DIAGNOSIS — R7983 Abnormal findings of blood amino-acid level: Secondary | ICD-10-CM

## 2023-10-18 DIAGNOSIS — Z23 Encounter for immunization: Secondary | ICD-10-CM | POA: Diagnosis not present

## 2023-10-18 DIAGNOSIS — F419 Anxiety disorder, unspecified: Secondary | ICD-10-CM | POA: Diagnosis not present

## 2023-10-18 DIAGNOSIS — G43909 Migraine, unspecified, not intractable, without status migrainosus: Secondary | ICD-10-CM

## 2023-10-18 DIAGNOSIS — I10 Essential (primary) hypertension: Secondary | ICD-10-CM

## 2023-10-18 MED ORDER — EZETIMIBE 10 MG PO TABS: 10.0000 mg | ORAL_TABLET | Freq: Every evening | ORAL | 3 refills | Status: AC

## 2023-10-18 MED ORDER — FOLIC ACID 1 MG PO TABS: 1.0000 mg | ORAL_TABLET | Freq: Every day | ORAL | 1 refills | Status: AC

## 2023-10-18 MED ORDER — ATORVASTATIN CALCIUM 80 MG PO TABS: 80.0000 mg | ORAL_TABLET | Freq: Every day | ORAL | 1 refills | Status: AC

## 2023-10-18 MED ORDER — BUPROPION HCL ER (XL) 150 MG PO TB24: 150.0000 mg | ORAL_TABLET | Freq: Every day | ORAL | 1 refills | Status: DC

## 2023-10-18 MED ORDER — LEVOTHYROXINE SODIUM 150 MCG PO TABS: ORAL_TABLET | ORAL | 1 refills | Status: AC

## 2023-10-18 MED ORDER — AMLODIPINE BESYLATE 10 MG PO TABS: 10.0000 mg | ORAL_TABLET | Freq: Every day | ORAL | 1 refills | Status: AC

## 2023-10-18 MED ORDER — CLONAZEPAM 0.5 MG PO TABS: 0.5000 mg | ORAL_TABLET | Freq: Two times a day (BID) | ORAL | 1 refills | Status: DC | PRN

## 2023-10-18 MED ORDER — CLOPIDOGREL BISULFATE 75 MG PO TABS: 75.0000 mg | ORAL_TABLET | Freq: Every day | ORAL | 1 refills | Status: DC

## 2023-10-18 MED ORDER — DESVENLAFAXINE SUCCINATE ER 50 MG PO TB24: 50.0000 mg | ORAL_TABLET | Freq: Every day | ORAL | 1 refills | Status: DC

## 2023-10-18 NOTE — Patient Instructions (Addendum)
 Return in about 1 month (around 11/19/2023).        Great to see you today.  I have refilled the medication(s) we provide.   If labs were collected or images ordered, we will inform you of  results once we have received them and reviewed. We will contact you either by echart message, or telephone call.  Please give ample time to the testing facility, and our office to run,  receive and review results. Please do not call inquiring of results, even if you can see them in your chart. We will contact you as soon as we are able. If it has been over 1 week since the test was completed, and you have not yet heard from us , then please call us .    - echart message- for normal results that have been seen by the patient already.   - telephone call: abnormal results or if patient has not viewed results in their echart.  If a referral to a specialist was entered for you, please call us  in 2 weeks if you have not heard from the specialist office to schedule.

## 2023-10-18 NOTE — Progress Notes (Signed)
 Tiffany Horn , 02-Dec-1960, 63 y.o., female MRN: [id number] Patient Care Team    Relationship Specialty Notifications Start End  Catherine Charlies LABOR, DO PCP - General Family Medicine  08/21/22   Shila Gustav GAILS, MD Consulting Physician Gastroenterology  05/29/16   Olegario Messier, MD Referring Physician Obstetrics and Gynecology  07/17/17   Brenna Adine LITTIE, DO Consulting Physician Pulmonary Disease  06/12/19   Cathlyn JAYSON Nikki Bobie FORBES, MD Consulting Physician Obstetrics and Gynecology  08/30/22   Rosemarie Eather RAMAN, MD Consulting Physician Neurology  07/11/23   Jeffrie Oneil JAYSON, MD Consulting Physician Cardiology  07/11/23     Chief Complaint  Patient presents with   Depression    Pt has not been taking Lexapro /Pristq.     Subjective: Tiffany Horn is a  63 y.o. Pt presents for an OV  Major depressive disorder, recurrent episode, mild (HCC)/Anxiety Patient reports she saw a interventional med provider, to see if she could assist with her gut health.  Patient reports she had a large amount of labs collected and when reviewed physician was more worried about her brain health and gut health.  They discussed her depression and anxiety, in which patient shared with her that the Lexapro  has caused her to have no sex drive which is a concern for her.  Physician then gave her instructions on tapering off of Lexapro  and Pristiq , and told to start Wellbutrin  150 XL.  Patient reports she feels the worst that she has in the past 10 years since doing so.  She did start a supplement for weight and hormones called happy Mamm[employer].  Provider also prescribed her progesterone, which she has not started due to fear considering her past medical history with TIAs, migraines and CVA.  She presents today to discuss possible medication change. Patient reports compliance with Klonopin  0.5 mg twice daily as needed Prior note Patient presents today and states that the Pristiq  50 mg is not enough to cover her anxiety  well.  She has been tried on Pristiq  100 mg, but that was not covering her anxiety well.  Pristiq  was then decreased to 500 was added.  Unfortunately she had reaction to Viibryd .  Pristiq  50 mg was continued, at the follow-up visit patient felt that she was doing okay.  Today however she feels like Pristiq  50 is not enough.  She has heard about medication called Lexapro  and is wondering if that would be a good fit for her.  Patient has had Gene[employer]ight testing, Viibryd  and Pristiq  are the preferred and Lexapro  is in the moderate range.  Patient is aware.  Prior note: At the last appointment patient was weaning off the Pristiq  and started the Viibryd  due to reports of inadequate coverage.  However after the Pristiq  was completely tapered off she endorsed having panic and could not sleep.  She was seen by another provider which took her off the Viibryd  and restarted Pristiq  at 50 mg a day, and since that time she states she is doing really well.  She is no longer taking the BuSpar  either  Prior note: Patient reports she feels about the same as she did on her prior medication regimens and does not see great improvement on the Pristiq /BuSpar  regimen, despite acknowledging the recommendations of best suited meds for her by GeneSight testing.  She is attending therapy twice a week.  Has tapered up to the Pristiq  100 mg daily and BuSpar  10 mg twice daily for about 5 weeks  now.  She reports she is getting out and walking daily.  She has been socializing with her friends and not isolating.  She has been hit with some unfortunate circumstances where she has lost her job and is looking for a new job.  She had a friend passed away from colon cancer unexpectedly.  One of her 38 year old dogs recently died.  She has been in contact with her ex-boyfriend and is considering getting back together with him.   Hypothyroidism: Patient reports compliance with levothyroxine  150 daily, there has not been a refill in quite some time  for pharmacy review.  TSH reviewed from labs collected in August by other provider and was mildly under supplemented. Osteoporosis: Patient reports she is not taking the Fosamax  Hyperlipidemia: Patient states that she is taking the Lipitor , but not the Zetia .  There has not been a Lipitor  refill in quite some time per pharmacy review.  Elevated cholesterol on reviewed labs from 08/2023  09/18/2023 [facility] labs: CBC with differential-WNL CMP-WNL Total cholesterol 254, HDL-P30.5, small LDL-P 1299 HDL-C 54, triglycerides 140, LDL-C 175 APO E Alzheimer's risk-negative Antidiuretic hormone 4.3-within normal limits Osmolality 289-within normal limits MTHFR-result not associated with an increased risk for hyperhomocysteinemia A1c 5.6 TSH 4.7-normal parameter is less than 4.5 with this lab.  T4 free within normal limits DHEA-sulfate within normal limits (low normal) Vitamin A within normal limits vitamin D  within normal limits, low normal at 31.1 Leptin 35.7 Uric acid 6.4  ferritin 65  CRP-normal melanocyte stimulating hormone within normal levels magnesium , RBC-6.2 within normal limits homocystine 8.2-within normal limits    09/19/2023    1:48 PM 11/03/2022    1:30 PM 10/13/2022    9:01 AM 09/05/2022    2:08 PM 07/05/2022    9:34 AM  Depression screen PHQ 2/9  Decreased Interest 0 1 2 2 3   Down, Depressed, Hopeless 1 1 3 3 3   PHQ - 2 Score 1 2 5 5 6   Altered sleeping  2 2 3 3   Tired, decreased energy  1 3 2 3   Change in appetite  0 3 3 1   Feeling bad or failure about yourself   0 2 3 1   Trouble concentrating  1 3 2 2   Moving slowly or fidgety/restless  0 2 3 0  Suicidal thoughts  0 0 1 0  PHQ-9 Score  6 20 22 16   Difficult doing work/chores  Somewhat difficult Very difficult Very difficult Somewhat difficult    Allergies  Allergen Reactions   Abilify  [Aripiprazole ] Nausea And Vomiting   Amoxil [Amoxicillin] Diarrhea   Promethazine  Hcl Other (See Comments)    Made pt feel weird    Seroquel [Quetiapine Fumarate] Other (See Comments)    Pt does not recall reaction   Social History   Social History Narrative   She is originally from [place]. She has traveled to [facility], [place], [place] , [place], [place], [place], [place], [place], [place] ,& [place]. No international travel. She has dogs. No prior bird, mold, or recent hot tub exposure. She hasn't used her hot tub in 1.5 years. 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 Previously enjoyed gardening and playing tennis. Helps to care for her mother.   Past Medical History:  Diagnosis Date   Allergy     Anxiety    Arthritis    knees and spine, shoulder   Asthma    Bronchitis    hx - recurrent   Complication of anesthesia  waking up is not easy   Depression    Elevated IgE level 09/12/2017   09/12/2017 IgE 195   GERD (gastroesophageal reflux disease)    History of COVID-19 12/30/2020   Hx of irritable bowel syndrome    x2   Hyperlipidemia    diet controlled - no medication   Hypertension    not taking any meds at present - under control per patient   Hypokalemia    with PNA admission (2.5)   Hypothyroidism (acquired)    Ischemic cerebrovascular accident (CVA) (HCC)    Migraines    Osteoporosis    Pneumonia    4 episodes; hosp. admission 2014   PONV (postoperative nausea and vomiting)    TIA (transient ischemic attack) 12/17/2020   UC (ulcerative colitis) [facility])    DX'D 2021   Past Surgical History:  Procedure Laterality Date   ANEURYSM COILING     STENT   BREAST SURGERY     implants, then had them removed   COLONOSCOPY     greater 10 yrs ago - ? [facility]-2017 LAST   DILATION AND CURETTAGE OF UTERUS     IR ANGIO INTRA EXTRACRAN SEL INTERNAL CAROTID BILAT MOD SED  11/19/2020   IR ANGIO VERTEBRAL SEL VERTEBRAL BILAT MOD SED  11/19/2020   IR ANGIO VERTEBRAL SEL VERTEBRAL UNI L MOD SED  02/25/2021   IR ANGIO VERTEBRAL SEL VERTEBRAL UNI L MOD SED  01/13/2022   IR ANGIO VERTEBRAL SEL  VERTEBRAL UNI L MOD SED  12/22/2022   IR ANGIOGRAM FOLLOW UP STUDY  11/19/2020   IR ANGIOGRAM FOLLOW UP STUDY  11/19/2020   IR ANGIOGRAM FOLLOW UP STUDY  11/19/2020   IR ANGIOGRAM FOLLOW UP STUDY  11/19/2020   IR ANGIOGRAM FOLLOW UP STUDY  11/19/2020   IR ANGIOGRAM FOLLOW UP STUDY  11/19/2020   IR ANGIOGRAM FOLLOW UP STUDY  11/19/2020   IR ANGIOGRAM FOLLOW UP STUDY  11/19/2020   IR INTRA CRAN STENT  11/19/2020   IR TRANSCATH/EMBOLIZ  11/19/2020   LAPAROSCOPIC ABDOMINAL EXPLORATION  01/31/1992   endometriosis   ORIF HUMERUS FRACTURE Left 04/01/2013   DR VERNETTA - shoulder   ORIF HUMERUS FRACTURE Left 04/01/2013   Procedure: OPEN REDUCTION INTERNAL FIXATION (ORIF) LEFT PROXIMAL HUMERUS FRACTURE;  Surgeon: Lonni CINDERELLA VERNETTA, MD;  Location: [facility] OR;  Service: Orthopedics;  Laterality: Left;   RADIOLOGY WITH ANESTHESIA N/A 11/19/2020   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;  Surgeon: Lanis Pupa, MD;  Location: [facility] OR;  Service: Radiology;  Laterality: N/A;   TONSILLECTOMY AND ADENOIDECTOMY     Family History  Problem Relation Age of Onset   Breast cancer Mother    Diabetes Mother    Heart disease Mother    Diabetes Father    Dementia Father    Colon cancer Father    Stroke Paternal Grandmother    Diabetes Paternal Grandmother    Colon polyps Neg Hx    Esophageal cancer Neg Hx    Stomach cancer Neg Hx    Rectal cancer Neg Hx    Crohn's disease Neg Hx    Ulcerative colitis Neg Hx    Allergies as of 10/18/2023       Reactions   Abilify  [aripiprazole ] Nausea And Vomiting   Amoxil [amoxicillin] Diarrhea   Promethazine  Hcl Other (See Comments)   Made pt feel weird   Seroquel [quetiapine Fumarate] Other (See Comments)   Pt does not recall reaction  Medication List        Accurate as of October 18, 2023  1:29 PM. If you have any questions, ask your nurse or doctor.          STOP taking these medications    alendronate  70 MG  tablet Commonly known as: FOSAMAX  Stopped by: Charlies Bellini   escitalopram  10 MG tablet Commonly known as: LEXAPRO  Stopped by: Charlies Bellini   ondansetron  4 MG disintegrating tablet Commonly known as: ZOFRAN -ODT Stopped by: Charlies Bellini   topiramate  25 MG tablet Commonly known as: TOPAMAX  Stopped by: Charlies Bellini   ZINC PO Stopped by: Charlies Bellini       TAKE these medications    amLODipine  10 MG tablet Commonly known as: NORVASC  Take 1 tablet (10 mg total) by mouth daily.   aspirin  EC 81 MG tablet Take 81 mg by mouth daily. Swallow whole.   atorvastatin  80 MG tablet Commonly known as: LIPITOR  Take 1 tablet (80 mg total) by mouth daily.   B-12 1000 MCG Subl Place 1,000 mcg under the tongue daily.   Breztri  Aerosphere 160-9-4.8 MCG/ACT Aero inhaler Generic drug: budesonide -glycopyrrolate -formoterol  Inhale 2 puffs into the lungs in the morning and at bedtime.   buPROPion  150 MG 24 hr tablet Commonly known as: WELLBUTRIN  XL Take 1 tablet (150 mg total) by mouth daily.   CALCIUM  MAGNESIUM  PO Take by mouth.   clonazePAM  0.5 MG tablet Commonly known as: KLONOPIN  Take 1 tablet (0.5 mg total) by mouth 2 (two) times daily as needed for anxiety.   clopidogrel  75 MG tablet Commonly known as: PLAVIX  Take 1 tablet (75 mg total) by mouth daily.   CO-Q 10 OMEGA-3 FISH OIL PO Take by mouth.   desvenlafaxine  50 MG 24 hr tablet Commonly known as: Pristiq  Take 1 tablet (50 mg total) by mouth daily. What changed: how much to take   ezetimibe  10 MG tablet Commonly known as: Zetia  Take 1 tablet (10 mg total) by mouth every evening.   folic acid  1 MG tablet Commonly known as: FOLVITE  Take 1 tablet (1 mg total) by mouth daily.   INOSITOL PO Take by mouth.   levothyroxine  150 MCG tablet Commonly known as: Synthroid  1 tab PO daily x6 days a week, 1.5 tabs PO 1 day a week What changed:  how much to take how to take this when to take this additional  instructions Changed by: Zayvon Alicea   multivitamin tablet Take 1 tablet by mouth daily.   pantoprazole  40 MG tablet Commonly known as: PROTONIX  Take 1 tablet (40 mg total) by mouth daily.   progesterone 100 MG capsule Commonly known as: PROMETRIUM Take by mouth.   Vitamin D  (Ergocalciferol ) 1.25 MG (50000 UNIT) Caps capsule Commonly known as: DRISDOL  TAKE 1 CAPSULE (50,000 UNITS TOTAL) BY MOUTH EVERY 14 DAYS   VITAMIN D -3 PO Take 1 capsule by mouth daily.        All past medical history, surgical history, allergies, family history, immunizations andmedications were updated in the EMR today and reviewed under the history and medication portions of their EMR.     ROS Negative, with the exception of above mentioned in HPI   Objective:  BP 124/82   Pulse 79   Temp 98.2 F (36.8 C)   Wt 175 lb (79.4 kg)   LMP 01/31/2007   SpO2 98%   BMI 28.46 kg/m  Body mass index is 28.46 kg/m. Physical Exam Vitals and nursing note reviewed.  Constitutional:  General: She is not in acute distress.    Appearance: Normal appearance. She is normal weight. She is not ill-appearing or toxic-appearing.     Comments: Looks very well today.  Smiling and appears happy  HENT:     Head: Normocephalic and atraumatic.  Eyes:     General: No scleral icterus.       Right eye: No discharge.        Left eye: No discharge.     Extraocular Movements: Extraocular movements intact.     Conjunctiva/sclera: Conjunctivae normal.     Pupils: Pupils are equal, round, and reactive to light.  Skin:    Findings: No rash.  Neurological:     Mental Status: She is alert and oriented to person, place, and time. Mental status is at baseline.     Motor: No weakness.     Coordination: Coordination normal.     Gait: Gait normal.  Psychiatric:        Mood and Affect: Mood normal.        Behavior: Behavior normal.        Thought Content: Thought content normal.        Judgment: Judgment normal.     No results found. No results found. No results found for this or any previous visit (from the past 24 hours).  Assessment/Plan: DORENE BRUNI is a 63 y.o. female present for OV for  Major depressive disorder, recurrent episode, mild (HCC)/anxiety/moodiness Lengthy discussion surrounding medication changes. Restart Pristiq  50 mg daily-she felt the best when on Pristiq  Continue Klonopin  twice daily as needed for now.  [place]  controlled substance database reviewed and appropriate Can continue Wellbutrin  150 XL, this has not worked well for her in the past for her depression, but she may find benefit with the add-on to Pristiq  to help with any potential undesired side effects to her libido  Hypothyroidism due to acquired atrophy of thyroid  Continue levothyroxine  150 mg daily 6 days a week and increase to 1.5 tabs 1 day a week Will need to repeat TSH on follow-up visit  Influenza vaccine needed - Flu vaccine trivalent PF, 6mos and older(Flulaval,Afluria,Fluarix,Fluzone)  Primary hypertension/hyperlipidemia/family history of heart disease/CVA Stable Continue amlodipine  10 mg daily  Vitamin D  deficiency/Age-related osteoporosis without current pathological fracture Currently not taking Fosamax , would encourage her to restart if possible. 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 Continue folate and B12 supplements    Reviewed expectations re: course of current medical issues. Discussed self-management of symptoms. Outlined signs and symptoms indicating need for more acute intervention. Patient verbalized  understanding and all questions were answered. Patient received an After-Visit Summary.  Return in about 1 month (around 11/19/2023).   Orders Placed This Encounter  Procedures   Flu vaccine trivalent PF, 6mos and older(Flulaval,Afluria,Fluarix,Fluzone)   Meds ordered this encounter  Medications   desvenlafaxine  (PRISTIQ ) 50 MG 24 hr tablet    Sig: Take 1 tablet (50 mg total) by mouth daily.    Dispense:  90 tablet    Refill:  1   folic acid  (FOLVITE ) 1 MG tablet    Sig: Take 1 tablet (1 mg total) by mouth daily.    Dispense:  90 tablet    Refill:  1   levothyroxine  (SYNTHROID ) 150 MCG tablet    Sig: 1 tab PO daily x6 days a week, 1.5 tabs PO 1 day a week    Dispense:  90 tablet    Refill:  1   ezetimibe  (ZETIA ) 10 MG tablet    Sig: Take 1 tablet (10 mg total) by mouth every evening.    Dispense:  90 tablet    Refill:  3   atorvastatin  (LIPITOR ) 80 MG tablet    Sig: Take 1 tablet (80 mg total) by mouth daily.    Dispense:  90 tablet    Refill:  1   clonazePAM  (KLONOPIN ) 0.5 MG tablet    Sig: Take 1 tablet (0.5 mg total) by mouth 2 (two) times daily as needed for anxiety.    Dispense:  180 tablet    Refill:  1   clopidogrel  (PLAVIX ) 75 MG tablet    Sig: Take 1 tablet (75 mg total) by mouth daily.    Dispense:  90 tablet    Refill:  1   amLODipine  (NORVASC ) 10 MG tablet    Sig: Take 1 tablet (10 mg total) by mouth daily.    Dispense:  90 tablet    Refill:  1   buPROPion  (WELLBUTRIN  XL) 150 MG 24 hr tablet    Sig: Take 1 tablet (150 mg total) by mouth daily.    Dispense:  90 tablet    Refill:  1   Referral Orders  No referral(s) requested today     Note is dictated utilizing voice recognition software. Although note has been proof read prior to signing, occasional typographical errors still can be missed. If any questions arise, please do not hesitate to call for verification.   electronically signed by:  Charlies Bellini, DO  [facility] - OR

## 2023-10-23 ENCOUNTER — Ambulatory Visit (INDEPENDENT_AMBULATORY_CARE_PROVIDER_SITE_OTHER)

## 2023-10-23 DIAGNOSIS — E538 Deficiency of other specified B group vitamins: Secondary | ICD-10-CM | POA: Diagnosis not present

## 2023-10-23 MED ORDER — CYANOCOBALAMIN 1000 MCG/ML IJ SOLN
1000.0000 ug | Freq: Once | INTRAMUSCULAR | Status: AC
  Administered 2023-10-23: 1000 ug via INTRAMUSCULAR

## 2023-10-23 NOTE — Progress Notes (Signed)
 Pt here for monthly B12 injection per Dr Catherine  Last B12 injection: 09/27/23    B12 1000mcg given IM, and pt tolerated injection well.  Next B12 injection scheduled for: 1 month

## 2023-10-24 ENCOUNTER — Ambulatory Visit: Admitting: Gastroenterology

## 2023-10-25 ENCOUNTER — Ambulatory Visit

## 2023-10-29 ENCOUNTER — Telehealth: Payer: Self-pay

## 2023-10-29 NOTE — Telephone Encounter (Signed)
Received PT evaluation. Will place on PCP desk for signature

## 2023-10-29 NOTE — Telephone Encounter (Signed)
Completed and placed in [facility] workbasket 

## 2023-10-30 NOTE — Telephone Encounter (Signed)
 Forms faxed 9/29.

## 2023-11-06 ENCOUNTER — Ambulatory Visit: Admitting: Obstetrics and Gynecology

## 2023-12-03 ENCOUNTER — Encounter: Payer: Self-pay | Admitting: Radiology

## 2023-12-10 ENCOUNTER — Ambulatory Visit: Payer: Self-pay

## 2023-12-10 ENCOUNTER — Encounter: Payer: Self-pay | Admitting: Family Medicine

## 2023-12-10 NOTE — Telephone Encounter (Signed)
 Copied from CRM #[id number]. Topic: Clinical - Red Word Triage >> Dec 10, 2023 10:00 AM Aleatha BROCKS wrote: [facility] that prompted transfer to Nurse Triage: ripped muscle tissue in back, pain is unbearable needs to know what to take due to previous brain bleeding there is several things she can not take

## 2023-12-10 NOTE — Telephone Encounter (Signed)
 FYI Only or Action Required?: Action required by provider: clinical question for provider.  Patient was last seen in primary care on 10/18/2023 by Catherine Fuller A, DO.  Called Nurse Triage reporting Advice Only.  Symptoms began couple days ago.  I Triage Disposition: Call PCP When Office is Open  Patient/caregiver understands and will follow disposition?: No, wishes to speak with PCP     Copied from CRM #[id number]. Topic: Clinical - Red Word Triage >> Dec 10, 2023 10:00 AM Tiffany Horn wrote: [facility] that prompted transfer to Nurse Triage: ripped muscle tissue in back, pain is unbearable needs to know what to take due to previous brain bleeding there is several things she can not take      Reason for Disposition  [1] Caller requesting NON-URGENT health information AND [2] PCP's office is the best resource    Routing to PCP office for review  Answer Assessment - Initial Assessment Questions 1. REASON FOR CALL: What is the main reason for your call? or How can I best help you? Pt stated she carried picked up and had to carry her 65 lb dog to vet to be put to sleep, stated when she picked the dog up she heard muscle tissue in back rip and has been in pain ever since.  Pt stated having history of previous brain bleeding therefore she is aware some things she is not allowed to take for pain.  Pt would like to request PCP recommend something the patient can take to help with pain: pt is unsure at present time what to take due to current medications and previous brain bleed. Nurse offered patient and appointment and pt refused: stating only needs list of options of over the counter medications that can help.  Pt would like for PCP to call her back.  Protocols used: Information Only Call - No Triage-A-AH

## 2023-12-11 ENCOUNTER — Telehealth: Payer: Self-pay | Admitting: Physician Assistant

## 2023-12-11 ENCOUNTER — Ambulatory Visit: Payer: Self-pay

## 2023-12-11 NOTE — Telephone Encounter (Signed)
 See pt message

## 2023-12-11 NOTE — Telephone Encounter (Signed)
 FYI Only or Action Required?: Action required by provider: clinical question for provider.  Patient was last seen in primary care on 10/18/2023 by Catherine Fuller A, DO.  Called Nurse Triage reporting Pain and Advice Only.  Symptoms began today.   Patient/caregiver understands and will follow disposition?: No, wishes to speak with PCP   Copied from CRM #[id number]. Topic: Clinical - Red Word Triage >> Dec 11, 2023 10:04 AM Frederich PARAS wrote: [facility] that prompted transfer to Nurse Triage: back pain  Pt pulled a muscle picking up dog going to the vet. Haven't been able to walk or sleep.. she can take anything other than tylenol  due to other medications she take. Reason for Disposition  [1] Caller requesting NON-URGENT health information AND [2] PCP's office is the best resource  Answer Assessment - Initial Assessment Questions 1. REASON FOR CALL: What is the main reason for your call? or How can I best help you?     Pt calling to follow up on recommendations from PCP related to the amount of back pain.  Nurse offered appointment and pt refused: pt stated she knows she pulled a muscle and just wants her PCP to call her back to inform at this point should she seek medical attention through ortho or can PCP tell pt to do to make pain better.  Pt states she is not able to get in mychart and therefore is requesting someone to physically call her with assistance.  Protocols used: Information Only Call - No Triage-A-AH

## 2023-12-11 NOTE — Telephone Encounter (Signed)
 Patient called and said that she was lifting her dog and strained her back very badly. She been taking tylenol . She dont know what to take for the pain. She wants to know if you recommend anything else she can take. CB# [phone]

## 2023-12-12 ENCOUNTER — Telehealth: Payer: Self-pay | Admitting: Physician Assistant

## 2023-12-12 ENCOUNTER — Other Ambulatory Visit: Payer: Self-pay | Admitting: Physician Assistant

## 2023-12-12 MED ORDER — TIZANIDINE HCL 2 MG PO CAPS: 2.0000 mg | ORAL_CAPSULE | Freq: Three times a day (TID) | ORAL | 0 refills | Status: DC

## 2023-12-12 NOTE — Telephone Encounter (Signed)
 Duplicate. Patient requests muscle relaxer. Sent message to [place]

## 2023-12-12 NOTE — Telephone Encounter (Signed)
 Patient called and said that she will take something for pain. The muscle relaxers but he needs to make sure that it doesn't interfere with the Plavix  that she takes. CB#[phone]

## 2023-12-13 ENCOUNTER — Telehealth: Payer: Self-pay

## 2023-12-13 NOTE — Telephone Encounter (Signed)
 Would need to see patient in the office for appointment with evaluation if patient is asking for treatment and imaging studies.  Please schedule patient

## 2023-12-13 NOTE — Telephone Encounter (Signed)
 Copied from CRM #[id number]. Topic: Clinical - Medical Advice >> Dec 13, 2023 11:33 AM Nessti S wrote: Reason for CRM: pt called because she would like pcp to order xrays for back. Call back number [phone]

## 2023-12-13 NOTE — Telephone Encounter (Signed)
 Communication  Reason for CRM: Patient called in requesting a call back in regards to referral for her to get imaging services for her strained back. Patient declined appointment that was offered to her for in office evaluation from Dr. Catherine. She states she just wants a imaging order. Patient can be reached at [phone].   See MyChart.

## 2023-12-13 NOTE — Telephone Encounter (Signed)
 See my chart encounter.

## 2023-12-14 NOTE — Telephone Encounter (Signed)
 Patient called in again today wanting to know if Dr Catherine is going to send her an order out to get an xray for her back that she said she knows she sprained while lifting her 60 pound dog that was dying to take him to the vet. She stated that she does not want to waste time coming in for a visit when she just need xrays done.

## 2023-12-14 NOTE — Telephone Encounter (Signed)
 Spoke with patient regarding results/recommendations.

## 2023-12-17 ENCOUNTER — Ambulatory Visit: Admitting: Family Medicine

## 2023-12-17 ENCOUNTER — Encounter: Payer: Self-pay | Admitting: Family Medicine

## 2023-12-17 ENCOUNTER — Ambulatory Visit (HOSPITAL_BASED_OUTPATIENT_CLINIC_OR_DEPARTMENT_OTHER)
Admission: RE | Admit: 2023-12-17 | Discharge: 2023-12-17 | Disposition: A | Discharge status: Home / Self Care | Source: Ambulatory Visit | Attending: Family Medicine | Admitting: Family Medicine

## 2023-12-17 VITALS — BP 120/80 | HR 73 | Temp 98.2°F | Wt 169.0 lb

## 2023-12-17 DIAGNOSIS — E538 Deficiency of other specified B group vitamins: Secondary | ICD-10-CM

## 2023-12-17 DIAGNOSIS — R3989 Other symptoms and signs involving the genitourinary system: Secondary | ICD-10-CM | POA: Diagnosis not present

## 2023-12-17 DIAGNOSIS — R197 Diarrhea, unspecified: Secondary | ICD-10-CM | POA: Insufficient documentation

## 2023-12-17 DIAGNOSIS — R1085 Abdominal pain of multiple sites: Secondary | ICD-10-CM | POA: Insufficient documentation

## 2023-12-17 DIAGNOSIS — M545 Low back pain, unspecified: Secondary | ICD-10-CM | POA: Diagnosis not present

## 2023-12-17 DIAGNOSIS — T148XXA Other injury of unspecified body region, initial encounter: Secondary | ICD-10-CM | POA: Insufficient documentation

## 2023-12-17 DIAGNOSIS — M546 Pain in thoracic spine: Secondary | ICD-10-CM

## 2023-12-17 DIAGNOSIS — R109 Unspecified abdominal pain: Secondary | ICD-10-CM | POA: Insufficient documentation

## 2023-12-17 DIAGNOSIS — R112 Nausea with vomiting, unspecified: Secondary | ICD-10-CM | POA: Insufficient documentation

## 2023-12-17 LAB — POC URINALSYSI DIPSTICK (AUTOMATED)
Bilirubin, UA: NEGATIVE
Glucose, UA: NEGATIVE
Ketones, UA: POSITIVE
Nitrite, UA: NEGATIVE
Protein, UA: POSITIVE — AB
Spec Grav, UA: 1.025 (ref 1.010–1.025)
Urobilinogen, UA: NEGATIVE U/dL — AB
pH, UA: 6 (ref 5.0–8.0)

## 2023-12-17 LAB — CBC WITH DIFFERENTIAL/PLATELET
Basophils Absolute: 0.1 K/uL (ref 0.0–0.1)
Basophils Relative: 1.3 % (ref 0.0–3.0)
Eosinophils Absolute: 0.3 K/uL (ref 0.0–0.7)
Eosinophils Relative: 5.3 % — ABNORMAL HIGH (ref 0.0–5.0)
HCT: 36.8 % (ref 36.0–46.0)
Hemoglobin: 12.5 g/dL (ref 12.0–15.0)
Lymphocytes Relative: 28.4 % (ref 12.0–46.0)
Lymphs Abs: 1.5 K/uL (ref 0.7–4.0)
MCHC: 33.9 g/dL (ref 30.0–36.0)
MCV: 87.4 fl (ref 78.0–100.0)
Monocytes Absolute: 0.5 K/uL (ref 0.1–1.0)
Monocytes Relative: 9.3 % (ref 3.0–12.0)
Neutro Abs: 2.9 K/uL (ref 1.4–7.7)
Neutrophils Relative %: 55.7 % (ref 43.0–77.0)
Platelets: 244 K/uL (ref 150.0–400.0)
RBC: 4.21 Mil/uL (ref 3.87–5.11)
RDW: 12.9 % (ref 11.5–15.5)
WBC: 5.3 K/uL (ref 4.0–10.5)

## 2023-12-17 MED ORDER — IOHEXOL 300 MG/ML  SOLN
100.0000 mL | Freq: Once | INTRAMUSCULAR | Status: AC | PRN
  Administered 2023-12-17: 100 mL via INTRAVENOUS

## 2023-12-17 MED ORDER — SUCRALFATE 1 G PO TABS
1.0000 g | ORAL_TABLET | Freq: Three times a day (TID) | ORAL | 0 refills | Status: AC
Start: 2023-12-17 — End: ?

## 2023-12-17 MED ORDER — CYANOCOBALAMIN 1000 MCG/ML IJ SOLN
1000.0000 ug | Freq: Once | INTRAMUSCULAR | Status: AC
  Administered 2023-12-17: 1000 ug via INTRAMUSCULAR

## 2023-12-17 MED ORDER — ONDANSETRON HCL 4 MG PO TABS: 4.0000 mg | ORAL_TABLET | Freq: Three times a day (TID) | ORAL | 0 refills | Status: DC | PRN

## 2023-12-17 NOTE — Patient Instructions (Addendum)

## 2023-12-17 NOTE — Progress Notes (Signed)
 Report of STAT CT Abd/plelvis called at 17:48. Results given to [place] with answering servic.

## 2023-12-17 NOTE — Progress Notes (Signed)
 Tiffany Horn , 1960-07-30, 63 y.o., female MRN: [id number] Patient Care Team    Relationship Specialty Notifications Start End  Catherine Charlies LABOR, DO PCP - General Family Medicine  08/21/22   Shila Gustav GAILS, MD Consulting Physician Gastroenterology  05/29/16   Olegario Messier, MD Referring Physician Obstetrics and Gynecology  07/17/17   Brenna Adine LITTIE, DO Consulting Physician Pulmonary Disease  06/12/19   Cathlyn JAYSON Nikki Bobie FORBES, MD Consulting Physician Obstetrics and Gynecology  08/30/22   Rosemarie Eather RAMAN, MD Consulting Physician Neurology  07/11/23   Jeffrie Oneil JAYSON, MD Consulting Physician Cardiology  07/11/23     Chief Complaint  Patient presents with   Back Pain    Since 11/8. Lower-middle of back. Denies urinary sx.      Subjective: Tiffany Horn is a 63 y.o. Pt presents for an OV with complaints of thick lower thoracic back pain that started 12/08/2023 after picking up a 68 pound dog.  He reports she went home that day ice to her back, was also using heat application and was prescribed a muscle relaxant by orthopedic.  On 12/12/2023 the pain started going across to her back and she also noticed some upper lumbar discomfort as well. Towards the end of the week, she had a small amount of mac & cheese and became very nauseous, vomited and then diarrhea sitting.  Saturday she had mashed potatoes and also became nauseous and vomited.  She has been hydrating and able to tolerate liquids.  She denies any urinary symptoms, but endorses having dark-colored urination x 2 over the weekend. She endorses lower abdominal pain since Saturday. She has been taking Tylenol  for discomfort. She does not have a history of kidney stones or diverticulosis.     12/17/2023    1:46 PM 09/19/2023    1:48 PM 11/03/2022    1:30 PM 10/13/2022    9:01 AM 09/05/2022    2:08 PM  Depression screen PHQ 2/9  Decreased Interest 2 0 1 2 2   Down, Depressed, Hopeless 2 1 1 3 3   PHQ - 2 Score 4 1 2 5 5    Altered sleeping 2  2 2 3   Tired, decreased energy 2  1 3 2   Change in appetite 1  0 3 3  Feeling bad or failure about yourself  0  0 2 3  Trouble concentrating 0  1 3 2   Moving slowly or fidgety/restless 0  0 2 3  Suicidal thoughts 0  0 0 1  PHQ-9 Score 9  6  20  22    Difficult doing work/chores Somewhat difficult  Somewhat difficult Very difficult Very difficult     Data saved with a previous flowsheet row definition    Allergies  Allergen Reactions   Abilify  [Aripiprazole ] Nausea And Vomiting   Amoxil [Amoxicillin] Diarrhea   Promethazine  Hcl Other (See Comments)    Made pt feel weird   Seroquel [Quetiapine Fumarate] Other (See Comments)    Pt does not recall reaction   Social History   Social History Narrative   She is originally from [place]. She has traveled to [facility], [place], [place] , [place], [place], [place], [place], [place], [place] ,& [place]. No international travel. She has dogs. No prior bird, mold, or recent hot tub exposure. She hasn't used her hot tub in 1.5 years. 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 Previously enjoyed gardening and playing tennis. Helps  to care for her mother.   Past Medical History:  Diagnosis Date   Allergy     Anxiety    Arthritis    knees and spine, shoulder   Asthma    Bronchitis    hx - recurrent   Complication of anesthesia    waking up is not easy   Depression    Elevated IgE level 09/12/2017   09/12/2017 IgE 195   GERD (gastroesophageal reflux disease)    History of COVID-19 12/30/2020   Hx of irritable bowel syndrome    x2   Hyperlipidemia    diet controlled - no medication   Hypertension    not taking any meds at present - under control per patient   Hypokalemia    with PNA admission (2.5)   Hypothyroidism (acquired)    Ischemic cerebrovascular accident (CVA) (HCC)    Migraines    Osteoporosis    Pneumonia    4 episodes; hosp. admission 2014   PONV (postoperative nausea and vomiting)    TIA  (transient ischemic attack) 12/17/2020   UC (ulcerative colitis) [facility])    DX'D 2021   Past Surgical History:  Procedure Laterality Date   ANEURYSM COILING     STENT   BREAST SURGERY     implants, then had them removed   COLONOSCOPY     greater 10 yrs ago - ? [facility]-2017 LAST   DILATION AND CURETTAGE OF UTERUS     IR ANGIO INTRA EXTRACRAN SEL INTERNAL CAROTID BILAT MOD SED  11/19/2020   IR ANGIO VERTEBRAL SEL VERTEBRAL BILAT MOD SED  11/19/2020   IR ANGIO VERTEBRAL SEL VERTEBRAL UNI L MOD SED  02/25/2021   IR ANGIO VERTEBRAL SEL VERTEBRAL UNI L MOD SED  01/13/2022   IR ANGIO VERTEBRAL SEL VERTEBRAL UNI L MOD SED  12/22/2022   IR ANGIOGRAM FOLLOW UP STUDY  11/19/2020   IR ANGIOGRAM FOLLOW UP STUDY  11/19/2020   IR ANGIOGRAM FOLLOW UP STUDY  11/19/2020   IR ANGIOGRAM FOLLOW UP STUDY  11/19/2020   IR ANGIOGRAM FOLLOW UP STUDY  11/19/2020   IR ANGIOGRAM FOLLOW UP STUDY  11/19/2020   IR ANGIOGRAM FOLLOW UP STUDY  11/19/2020   IR ANGIOGRAM FOLLOW UP STUDY  11/19/2020   IR INTRA CRAN STENT  11/19/2020   IR TRANSCATH/EMBOLIZ  11/19/2020   LAPAROSCOPIC ABDOMINAL EXPLORATION  01/31/1992   endometriosis   ORIF HUMERUS FRACTURE Left 04/01/2013   DR VERNETTA - shoulder   ORIF HUMERUS FRACTURE Left 04/01/2013   Procedure: OPEN REDUCTION INTERNAL FIXATION (ORIF) LEFT PROXIMAL HUMERUS FRACTURE;  Surgeon: Lonni CINDERELLA Vernetta, MD;  Location: [facility] OR;  Service: Orthopedics;  Laterality: Left;   RADIOLOGY WITH ANESTHESIA N/A 11/19/2020   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;  Surgeon: Lanis Pupa, MD;  Location: [facility] OR;  Service: Radiology;  Laterality: N/A;   TONSILLECTOMY AND ADENOIDECTOMY     Family History  Problem Relation Age of Onset   Breast cancer Mother    Diabetes Mother    Heart disease Mother    Diabetes Father    Dementia Father    Colon cancer Father    Stroke Paternal Grandmother    Diabetes Paternal Grandmother    Colon polyps Neg Hx     Esophageal cancer Neg Hx    Stomach cancer Neg Hx    Rectal cancer Neg Hx    Crohn's disease Neg Hx    Ulcerative colitis Neg Hx    Allergies as of 12/17/2023  Reactions   Abilify  [aripiprazole ] Nausea And Vomiting   Amoxil [amoxicillin] Diarrhea   Promethazine  Hcl Other (See Comments)   Made pt feel weird   Seroquel [quetiapine Fumarate] Other (See Comments)   Pt does not recall reaction        Medication List        Accurate as of December 17, 2023  3:05 PM. If you have any questions, ask your nurse or doctor.          amLODipine  10 MG tablet Commonly known as: NORVASC  Take 1 tablet (10 mg total) by mouth daily.   aspirin  EC 81 MG tablet Take 81 mg by mouth daily. Swallow whole.   atorvastatin  80 MG tablet Commonly known as: LIPITOR  Take 1 tablet (80 mg total) by mouth daily.   B-12 1000 MCG Subl Place 1,000 mcg under the tongue daily.   Breztri  Aerosphere 160-9-4.8 MCG/ACT Aero inhaler Generic drug: budesonide -glycopyrrolate -formoterol  Inhale 2 puffs into the lungs in the morning and at bedtime.   buPROPion  150 MG 24 hr tablet Commonly known as: WELLBUTRIN  XL Take 1 tablet (150 mg total) by mouth daily.   CALCIUM  MAGNESIUM  PO Take by mouth.   clonazePAM  0.5 MG tablet Commonly known as: KLONOPIN  Take 1 tablet (0.5 mg total) by mouth 2 (two) times daily as needed for anxiety.   clopidogrel  75 MG tablet Commonly known as: PLAVIX  Take 1 tablet (75 mg total) by mouth daily.   CO-Q 10 OMEGA-3 FISH OIL PO Take by mouth.   desvenlafaxine  50 MG 24 hr tablet Commonly known as: Pristiq  Take 1 tablet (50 mg total) by mouth daily.   ezetimibe  10 MG tablet Commonly known as: Zetia  Take 1 tablet (10 mg total) by mouth every evening.   folic acid  1 MG tablet Commonly known as: FOLVITE  Take 1 tablet (1 mg total) by mouth daily.   INOSITOL PO Take by mouth.   levothyroxine  150 MCG tablet Commonly known as: Synthroid  1 tab PO daily x6 days a  week, 1.5 tabs PO 1 day a week   multivitamin tablet Take 1 tablet by mouth daily.   ondansetron  4 MG tablet Commonly known as: ZOFRAN  Take 1 tablet (4 mg total) by mouth every 8 (eight) hours as needed for nausea or vomiting. Started by: Sayge Salvato   pantoprazole  40 MG tablet Commonly known as: PROTONIX  Take 1 tablet (40 mg total) by mouth daily.   progesterone 100 MG capsule Commonly known as: PROMETRIUM Take by mouth.   sucralfate 1 g tablet Commonly known as: Carafate Take 1 tablet (1 g total) by mouth 4 (four) times daily -  with meals and at bedtime. Started by: Traveion Ruddock   tizanidine 2 MG capsule Commonly known as: Zanaflex Take 1 capsule (2 mg total) by mouth 3 (three) times daily.   Vitamin D  (Ergocalciferol ) 1.25 MG (50000 UNIT) Caps capsule Commonly known as: DRISDOL  TAKE 1 CAPSULE (50,000 UNITS TOTAL) BY MOUTH EVERY 14 DAYS   VITAMIN D -3 PO Take 1 capsule by mouth daily.        All past medical history, surgical history, allergies, family history, immunizations andmedications were updated in the EMR today and reviewed under the history and medication portions of their EMR.     Review of Systems  Constitutional:  Positive for chills, diaphoresis, fever and malaise/fatigue.  HENT: Negative.    Eyes: Negative.   Respiratory: Negative.    Cardiovascular: Negative.   Gastrointestinal:  Positive for abdominal pain, diarrhea, nausea and vomiting. Negative  for blood in stool, constipation and melena.  Genitourinary:  Positive for flank pain and hematuria. Negative for dysuria, frequency and urgency.  Musculoskeletal:  Positive for back pain. Negative for myalgias and neck pain.  Skin:  Negative for rash.  Neurological:  Negative for dizziness and headaches.   Negative, with the exception of above mentioned in HPI   Objective:  BP 120/80   Pulse 73   Temp 98.2 F (36.8 C)   Wt 169 lb (76.7 kg)   LMP 01/31/2007   SpO2 98%   BMI 27.49 kg/m  Body  mass index is 27.49 kg/m. Physical Exam Vitals and nursing note reviewed.  Constitutional:      General: She is not in acute distress.    Appearance: Normal appearance. She is normal weight. She is not ill-appearing or toxic-appearing.  HENT:     Head: Normocephalic and atraumatic.  Eyes:     General: No scleral icterus.       Right eye: No discharge.        Left eye: No discharge.     Extraocular Movements: Extraocular movements intact.     Conjunctiva/sclera: Conjunctivae normal.     Pupils: Pupils are equal, round, and reactive to light.  Abdominal:     General: Abdomen is flat. Bowel sounds are increased. There is no distension.     Palpations: Abdomen is soft. There is no hepatomegaly or mass.     Tenderness: There is abdominal tenderness in the right lower quadrant, periumbilical area, suprapubic area and left lower quadrant. There is right CVA tenderness and left CVA tenderness. There is no guarding or rebound. Negative signs include Murphy's sign and McBurney's sign.  Musculoskeletal:        General: Tenderness present.     Thoracic back: Spasms and tenderness present. No bony tenderness. Decreased range of motion.     Lumbar back: Spasms and tenderness present. Decreased range of motion.  Skin:    Findings: No rash.  Neurological:     Mental Status: She is alert and oriented to person, place, and time. Mental status is at baseline.     Motor: No weakness.     Coordination: Coordination normal.     Gait: Gait normal.  Psychiatric:        Mood and Affect: Mood normal.        Behavior: Behavior normal.        Thought Content: Thought content normal.        Judgment: Judgment normal.     No results found. No results found. Results for orders placed or performed in visit on 12/17/23 (from the past 24 hours)  POCT Urinalysis Dipstick (Automated)     Status: Abnormal   Collection Time: 12/17/23  2:39 PM  Result Value Ref Range   Color, UA dark yellow    Clarity, UA  clear    Glucose, UA Negative Negative   Bilirubin, UA negative    Ketones, UA positive    Spec Grav, UA 1.025 1.010 - 1.025   Blood, UA none    pH, UA 6.0 5.0 - 8.0   Protein, UA Positive (A) Negative   Urobilinogen, UA negative (A) 0.2 or 1.0 E.U./dL   Nitrite, UA negative    Leukocytes, UA Small (1+) (A) Negative    Assessment/Plan: Tiffany Horn is a 63 y.o. female present for OV for  Vitamin B12 deficiency - cyanocobalamin  (VITAMIN B12) injection 1,000 mcg  Abdominal pain of multiple sites (Primary)/nausea and  vomiting/diarrhea/abdominal cramping Patient with a constellation of symptoms (muscle skeletal, GI and urological.  Will need to obtain lab work today and move forward with a CT abdomen pelvis monitor to better understand the etiology of her symptoms. - CBC w/Diff - Comp Met (CMET) - Lipase - CT ABDOMEN PELVIS W CONTRAST; Future  Abdominal cramping/Abnormal urine color -Point-of-care urinalysis collected today: Dark yellow, ketones, +1 leuks Urinalysis with reflex culture-pending CT abdomen pelvis-will follow-up colitis, diverticulitis, kidney stone or other abdominal etiology of her pain  Thoracolumbar back pain/Muscle strain Initial onset of back discomfort certainly was from a muscle strain from lifting recalled. She was provided with Zanaflex-encouraged her to continue along with rest and heat application if needed. Cannot rule she has not since developed kidney stones causing more of her lower back ache and GI/urinary complaints   Reviewed expectations re: course of current medical issues. Discussed self-management of symptoms. Outlined signs and symptoms indicating need for more acute intervention. Patient verbalized understanding and all questions were answered. Patient received an After-Visit Summary.    Orders Placed This Encounter  Procedures   CT ABDOMEN PELVIS W CONTRAST   CBC w/Diff   Comp Met (CMET)   Lipase   Urinalysis w microscopic +  reflex cultur   POCT Urinalysis Dipstick (Automated)   Meds ordered this encounter  Medications   cyanocobalamin  (VITAMIN B12) injection 1,000 mcg   sucralfate (CARAFATE) 1 g tablet    Sig: Take 1 tablet (1 g total) by mouth 4 (four) times daily -  with meals and at bedtime.    Dispense:  120 tablet    Refill:  0   ondansetron  (ZOFRAN ) 4 MG tablet    Sig: Take 1 tablet (4 mg total) by mouth every 8 (eight) hours as needed for nausea or vomiting.    Dispense:  30 tablet    Refill:  0   Referral Orders  No referral(s) requested today   I personally spent a total of 42 minutes in the care of the patient today including preparing to see the patient, getting/reviewing separately obtained history, performing a medically appropriate exam/evaluation, counseling and educating, placing orders, referring and communicating with other health care professionals, documenting clinical information in the EHR, independently interpreting results, communicating results, and coordinating care.   Note is dictated utilizing voice recognition software. Although note has been proof read prior to signing, occasional typographical errors still can be missed. If any questions arise, please do not hesitate to call for verification.   electronically signed by:  Charlies Bellini, DO  [facility] - [place]

## 2023-12-18 ENCOUNTER — Ambulatory Visit: Payer: Self-pay

## 2023-12-18 ENCOUNTER — Ambulatory Visit: Payer: Self-pay | Admitting: Family Medicine

## 2023-12-18 DIAGNOSIS — K4020 Bilateral inguinal hernia, without obstruction or gangrene, not specified as recurrent: Secondary | ICD-10-CM | POA: Insufficient documentation

## 2023-12-18 DIAGNOSIS — K760 Fatty (change of) liver, not elsewhere classified: Secondary | ICD-10-CM | POA: Insufficient documentation

## 2023-12-18 DIAGNOSIS — S22000A Wedge compression fracture of unspecified thoracic vertebra, initial encounter for closed fracture: Secondary | ICD-10-CM | POA: Insufficient documentation

## 2023-12-18 LAB — COMPREHENSIVE METABOLIC PANEL WITH GFR
ALT: 13 U/L (ref 0–35)
AST: 14 U/L (ref 0–37)
Albumin: 4.3 g/dL (ref 3.5–5.2)
Alkaline Phosphatase: 79 U/L (ref 39–117)
BUN: 10 mg/dL (ref 6–23)
CO2: 26 meq/L (ref 19–32)
Calcium: 9.2 mg/dL (ref 8.4–10.5)
Chloride: 102 meq/L (ref 96–112)
Creatinine, Ser: 0.76 mg/dL (ref 0.40–1.20)
GFR: 84.52 mL/min (ref >60.00)
Glucose, Bld: 81 mg/dL (ref 70–99)
Potassium: 3.9 meq/L (ref 3.5–5.1)
Sodium: 139 meq/L (ref 135–145)
Total Bilirubin: 0.4 mg/dL (ref 0.2–1.2)
Total Protein: 6.7 g/dL (ref 6.0–8.3)

## 2023-12-18 LAB — LIPASE: Lipase: 13 U/L (ref 11.0–59.0)

## 2023-12-18 MED ORDER — CALCITONIN (SALMON) 200 UNIT/ACT NA SOLN: 1.0000 | Freq: Every day | NASAL | 1 refills | Status: DC

## 2023-12-18 NOTE — Telephone Encounter (Signed)
 FYI Only or Action Required?: Action required by provider: update on patient condition.  Patient was last seen in primary care on 12/17/2023 by Catherine Fuller A, DO.  Called Nurse Triage reporting Results.   Triage Disposition: Call PCP When Office is Open  Patient/caregiver understands and will follow disposition?: Yes        Copied from CRM [id number]. Topic: Clinical - Red Word Triage >> Dec 18, 2023  3:22 PM Robinson H wrote: [facility] that prompted transfer to Nurse Triage: Patient is having pain, waiting on lab results. Reached out to [facility] and was advised to triage patient Reason for Disposition  [1] Caller requesting NON-URGENT health information AND [2] PCP's office is the best resource  Answer Assessment - Initial Assessment Questions This RN read verbatim her provider's notes that were listed in the results follow up encounter for today. Pt verbalized understanding and is requesting that the provider gives her a call since she is unable to get into her MyChart .   1. REASON FOR CALL: What is the main reason for your call? or How can I best help you?     Requesting results from recent testing.  2. SYMPTOMS : Do you have any symptoms?      She states she is having continuing pain  3. OTHER QUESTIONS: Do you have any other questions?     Denies  Protocols used: Information Only Call - No Triage-A-AH

## 2023-12-18 NOTE — Telephone Encounter (Signed)
 LM for pt to return call to discuss or read mychart message

## 2023-12-18 NOTE — Telephone Encounter (Signed)
 Please call patient-I also sent her MyChart message.  Attempted to call without success today.  Lab results CBC, CMP and lipase within normal limits Urinalysis with crystals-culture pending.  Hydrate well.  The evidence of crystals could lead to kidney stone formation.  Currently you do not have a kidney stone  Small bilateral inguinal hernias contain fat. >>>new.  This could be why lower abdominal pressure felt.  If the symptoms continue, or worsen we need to refer to surgery to discuss surgical correction.  Since these are small, may not need surgical intervention you may see symptoms improve over the next couple weeks.   T7 and T11 compression fractures>>>new T12 compression fraction has been present and old, T7-T11 are new compression fractures Tylenol , calcitonin nasal spray prescribed-1 spray in the nostril daily for 4 weeks. 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 - Usually at this level they may perform some tests and just monitor yearly.

## 2023-12-19 ENCOUNTER — Ambulatory Visit: Admitting: Family Medicine

## 2023-12-19 ENCOUNTER — Encounter: Payer: Self-pay | Admitting: Family Medicine

## 2023-12-19 VITALS — BP 118/76 | HR 92 | Temp 98.1°F | Wt 167.6 lb

## 2023-12-19 DIAGNOSIS — K4020 Bilateral inguinal hernia, without obstruction or gangrene, not specified as recurrent: Secondary | ICD-10-CM

## 2023-12-19 DIAGNOSIS — R1085 Abdominal pain of multiple sites: Secondary | ICD-10-CM

## 2023-12-19 DIAGNOSIS — K760 Fatty (change of) liver, not elsewhere classified: Secondary | ICD-10-CM | POA: Diagnosis not present

## 2023-12-19 DIAGNOSIS — S22000A Wedge compression fracture of unspecified thoracic vertebra, initial encounter for closed fracture: Secondary | ICD-10-CM

## 2023-12-19 DIAGNOSIS — M810 Age-related osteoporosis without current pathological fracture: Secondary | ICD-10-CM

## 2023-12-19 DIAGNOSIS — R3989 Other symptoms and signs involving the genitourinary system: Secondary | ICD-10-CM

## 2023-12-19 DIAGNOSIS — R109 Unspecified abdominal pain: Secondary | ICD-10-CM

## 2023-12-19 LAB — URINALYSIS W MICROSCOPIC + REFLEX CULTURE
Bilirubin Urine: NEGATIVE
Glucose, UA: NEGATIVE
Hgb urine dipstick: NEGATIVE
Nitrites, Initial: NEGATIVE
Specific Gravity, Urine: 1.026 (ref 1.001–1.035)
pH: 5.5 (ref 5.0–8.0)

## 2023-12-19 LAB — CULTURE INDICATED

## 2023-12-19 LAB — URINE CULTURE
MICRO NUMBER:: 17247790
Result:: NO GROWTH
SPECIMEN QUALITY:: ADEQUATE

## 2023-12-19 MED ORDER — HYDROCODONE-ACETAMINOPHEN 10-325 MG PO TABS: 1.0000 | ORAL_TABLET | Freq: Three times a day (TID) | ORAL | 0 refills | Status: AC | PRN

## 2023-12-19 MED ORDER — BUPROPION HCL ER (XL) 150 MG PO TB24: 150.0000 mg | ORAL_TABLET | Freq: Every day | ORAL | 1 refills | Status: AC

## 2023-12-19 MED ORDER — DESVENLAFAXINE SUCCINATE ER 100 MG PO TB24: 100.0000 mg | ORAL_TABLET | Freq: Every day | ORAL | 1 refills | Status: AC

## 2023-12-19 MED ORDER — TIZANIDINE HCL 4 MG PO CAPS
4.0000 mg | ORAL_CAPSULE | Freq: Three times a day (TID) | ORAL | 5 refills | Status: AC
Start: 2023-12-19 — End: ?

## 2023-12-19 MED ORDER — CALCITONIN (SALMON) 200 UNIT/ACT NA SOLN: 1.0000 | Freq: Every day | NASAL | 1 refills | Status: DC

## 2023-12-19 MED ORDER — CLOPIDOGREL BISULFATE 75 MG PO TABS: 75.0000 mg | ORAL_TABLET | Freq: Every day | ORAL | 1 refills | Status: DC

## 2023-12-19 MED ORDER — CLONAZEPAM 0.5 MG PO TABS: 0.5000 mg | ORAL_TABLET | Freq: Two times a day (BID) | ORAL | 1 refills | Status: AC | PRN

## 2023-12-19 NOTE — Progress Notes (Unsigned)
 Tiffany Horn , 02-22-1960, 62 y.o., female MRN: [id number] Patient Care Team    Relationship Specialty Notifications Start End  Catherine Charlies LABOR, DO PCP - General Family Medicine  08/21/22   Shila Gustav GAILS, MD Consulting Physician Gastroenterology  05/29/16   Olegario Messier, MD Referring Physician Obstetrics and Gynecology  07/17/17   Brenna Adine LITTIE, DO Consulting Physician Pulmonary Disease  06/12/19   Cathlyn JAYSON Nikki Bobie FORBES, MD Consulting Physician Obstetrics and Gynecology  08/30/22   Rosemarie Eather RAMAN, MD Consulting Physician Neurology  07/11/23   Jeffrie Oneil JAYSON, MD Consulting Physician Cardiology  07/11/23     Chief Complaint  Patient presents with   discuss lab results     Subjective: Tiffany Horn is a 62 y.o. Pt presents for an OV with complaints of lower thoracic back pain that started 12/08/2023 after picking up a 68 pound dog.  He reports she went home that day ice to her back, was also using heat application and was prescribed a muscle relaxant by orthopedic.  On 12/12/2023 the pain started going across to her back and she also noticed some upper lumbar discomfort as well. Towards the end of the week, she had a small amount of mac & cheese and became very nauseous, vomited and then diarrhea sitting.  Saturday she had mashed potatoes and also became nauseous and vomited.  She has been hydrating and able to tolerate liquids.  She denies any urinary symptoms, but endorses having dark-colored urination x 2 over the weekend. She endorses lower abdominal pain since Saturday. She has been taking Tylenol  for discomfort. She does not have a history of kidney stones or diverticulosis.    Lab results CBC, CMP and lipase within normal limits Urinalysis with crystals-culture pending.  Hydrate well.  The evidence of crystals could lead to kidney stone formation.  Currently you do not have a kidney stone  Small bilateral inguinal hernias contain fat. >>>new.  This could be why  lower abdominal pressure felt.  If the symptoms continue, or worsen we need to refer to surgery to discuss surgical correction.  Since these are small, may not need surgical intervention you may see symptoms improve over the next couple weeks.   T7 and T11 compression fractures>>>new T12 compression fraction has been present and old, T7-T11 are new compression fractures Tylenol , calcitonin nasal spray prescribed-1 spray in the nostril daily for 4 weeks. 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 - Usually at this level they may perform some tests and just monitor yearly.     12/17/2023    1:46 PM 09/19/2023    1:48 PM 11/03/2022    1:30 PM 10/13/2022    9:01 AM 09/05/2022    2:08 PM  Depression screen PHQ 2/9  Decreased Interest 2 0 1 2 2   Down, Depressed, Hopeless 2 1 1 3 3   PHQ - 2 Score 4 1 2 5 5   Altered sleeping 2  2 2 3   Tired, decreased energy 2  1 3 2   Change in appetite 1  0 3 3  Feeling bad or failure about yourself  0  0 2 3  Trouble concentrating 0  1 3 2   Moving slowly or fidgety/restless 0  0 2  3  Suicidal thoughts 0  0 0 1  PHQ-9 Score 9  6  20  22    Difficult doing work/chores Somewhat difficult  Somewhat difficult Very difficult Very difficult     Data saved with a previous flowsheet row definition    Allergies  Allergen Reactions   Abilify  [Aripiprazole ] Nausea And Vomiting   Amoxil [Amoxicillin] Diarrhea   Promethazine  Hcl Other (See Comments)    Made pt feel weird   Seroquel [Quetiapine Fumarate] Other (See Comments)    Pt does not recall reaction   Social History   Social History Narrative   She is originally from [place]. She has traveled to [facility], [place], [place] , [place], [place], [place], [place], [place], [place] ,& [place]. No  international travel. She has dogs. No prior bird, mold, or recent hot tub exposure. She hasn't used her hot tub in 1.5 years. 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 Previously enjoyed gardening and playing tennis. Helps to care for her mother.   Past Medical History:  Diagnosis Date   Allergy     Anxiety    Arthritis    knees and spine, shoulder   Asthma    Bronchitis    hx - recurrent   Complication of anesthesia    waking up is not easy   Depression    Elevated IgE level 09/12/2017   09/12/2017 IgE 195   GERD (gastroesophageal reflux disease)    History of COVID-19 12/30/2020   Hx of irritable bowel syndrome    x2   Hyperlipidemia    diet controlled - no medication   Hypertension    not taking any meds at present - under control per patient   Hypokalemia    with PNA admission (2.5)   Hypothyroidism (acquired)    Ischemic cerebrovascular accident (CVA) (HCC)    Migraines    Osteoporosis    Pneumonia    4 episodes; hosp. admission 2014   PONV (postoperative nausea and vomiting)    TIA (transient ischemic attack) 12/17/2020   UC (ulcerative colitis) [facility])    DX'D 2021   Past Surgical History:  Procedure Laterality Date   ANEURYSM COILING     STENT   BREAST SURGERY     implants, then had them removed   COLONOSCOPY     greater 10 yrs ago - ? [facility]-2017 LAST   DILATION AND CURETTAGE OF UTERUS     IR ANGIO INTRA EXTRACRAN SEL INTERNAL CAROTID BILAT MOD SED  11/19/2020   IR ANGIO VERTEBRAL SEL VERTEBRAL BILAT MOD SED  11/19/2020   IR ANGIO VERTEBRAL SEL VERTEBRAL UNI L MOD SED  02/25/2021   IR ANGIO VERTEBRAL SEL VERTEBRAL UNI L MOD SED  01/13/2022   IR ANGIO VERTEBRAL SEL VERTEBRAL UNI L MOD SED  12/22/2022   IR ANGIOGRAM FOLLOW UP STUDY  11/19/2020   IR ANGIOGRAM FOLLOW UP STUDY  11/19/2020   IR ANGIOGRAM FOLLOW UP STUDY  11/19/2020   IR ANGIOGRAM FOLLOW UP STUDY  11/19/2020   IR ANGIOGRAM FOLLOW UP STUDY   11/19/2020   IR ANGIOGRAM FOLLOW UP STUDY  11/19/2020   IR ANGIOGRAM FOLLOW UP STUDY  11/19/2020   IR ANGIOGRAM FOLLOW UP STUDY  11/19/2020   IR INTRA CRAN STENT  11/19/2020   IR TRANSCATH/EMBOLIZ  11/19/2020   LAPAROSCOPIC ABDOMINAL EXPLORATION  01/31/1992   endometriosis   ORIF HUMERUS FRACTURE Left 04/01/2013   DR VERNETTA - shoulder   ORIF  HUMERUS FRACTURE Left 04/01/2013   Procedure: OPEN REDUCTION INTERNAL FIXATION (ORIF) LEFT PROXIMAL HUMERUS FRACTURE;  Surgeon: Lonni CINDERELLA Poli, MD;  Location: [facility] OR;  Service: Orthopedics;  Laterality: Left;   RADIOLOGY WITH ANESTHESIA N/A 11/19/2020   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;  Surgeon: Lanis Pupa, MD;  Location: [facility] OR;  Service: Radiology;  Laterality: N/A;   TONSILLECTOMY AND ADENOIDECTOMY     Family History  Problem Relation Age of Onset   Breast cancer Mother    Diabetes Mother    Heart disease Mother    Diabetes Father    Dementia Father    Colon cancer Father    Stroke Paternal Grandmother    Diabetes Paternal Grandmother    Colon polyps Neg Hx    Esophageal cancer Neg Hx    Stomach cancer Neg Hx    Rectal cancer Neg Hx    Crohn's disease Neg Hx    Ulcerative colitis Neg Hx    Allergies as of 12/19/2023       Reactions   Abilify  [aripiprazole ] Nausea And Vomiting   Amoxil [amoxicillin] Diarrhea   Promethazine  Hcl Other (See Comments)   Made pt feel weird   Seroquel [quetiapine Fumarate] Other (See Comments)   Pt does not recall reaction        Medication List        Accurate as of December 19, 2023  3:16 PM. If you have any questions, ask your nurse or doctor.          amLODipine  10 MG tablet Commonly known as: NORVASC  Take 1 tablet (10 mg total) by mouth daily.   aspirin  EC 81 MG tablet Take 81 mg by mouth daily. Swallow whole.   atorvastatin  80 MG tablet Commonly known as: LIPITOR  Take 1 tablet (80 mg total) by mouth daily.   B-12 1000 MCG Subl Place  1,000 mcg under the tongue daily.   Breztri  Aerosphere 160-9-4.8 MCG/ACT Aero inhaler Generic drug: budesonide -glycopyrrolate -formoterol  Inhale 2 puffs into the lungs in the morning and at bedtime. What changed: additional instructions   buPROPion  150 MG 24 hr tablet Commonly known as: WELLBUTRIN  XL Take 1 tablet (150 mg total) by mouth daily.   calcitonin (salmon) 200 UNIT/ACT nasal spray Commonly known as: MIACALCIN/FORTICAL Place 1 spray into alternate nostrils daily.   CALCIUM  MAGNESIUM  PO Take by mouth.   clonazePAM  0.5 MG tablet Commonly known as: KLONOPIN  Take 1 tablet (0.5 mg total) by mouth 2 (two) times daily as needed for anxiety.   clopidogrel  75 MG tablet Commonly known as: PLAVIX  Take 1 tablet (75 mg total) by mouth daily.   CO-Q 10 OMEGA-3 FISH OIL PO Take by mouth.   desvenlafaxine  100 MG 24 hr tablet Commonly known as: Pristiq  Take 1 tablet (100 mg total) by mouth daily. What changed:  medication strength how much to take Changed by: Charlies Bellini   ezetimibe  10 MG tablet Commonly known as: Zetia  Take 1 tablet (10 mg total) by mouth every evening.   folic acid  1 MG tablet Commonly known as: FOLVITE  Take 1 tablet (1 mg total) by mouth daily.   HYDROcodone -acetaminophen  10-325 MG tablet Commonly known as: NORCO Take 1 tablet by mouth every 8 (eight) hours as needed for up to 7 days for moderate pain (pain score 4-6) or severe pain (pain score 7-10). Started by: Charlies Bellini   INOSITOL PO Take by mouth.   levothyroxine  150 MCG tablet Commonly known as: Synthroid  1 tab PO daily x6 days  a week, 1.5 tabs PO 1 day a week   multivitamin tablet Take 1 tablet by mouth daily.   ondansetron  4 MG tablet Commonly known as: ZOFRAN  Take 1 tablet (4 mg total) by mouth every 8 (eight) hours as needed for nausea or vomiting.   pantoprazole  40 MG tablet Commonly known as: PROTONIX  Take 1 tablet (40 mg total) by mouth daily.   progesterone 100 MG  capsule Commonly known as: PROMETRIUM Take by mouth.   sucralfate 1 g tablet Commonly known as: Carafate Take 1 tablet (1 g total) by mouth 4 (four) times daily -  with meals and at bedtime.   tiZANidine 4 MG capsule Commonly known as: Zanaflex Take 1 capsule (4 mg total) by mouth 3 (three) times daily. What changed:  medication strength how much to take Changed by: Oluwadamilola Rosamond   Vitamin D  (Ergocalciferol ) 1.25 MG (50000 UNIT) Caps capsule Commonly known as: DRISDOL  TAKE 1 CAPSULE (50,000 UNITS TOTAL) BY MOUTH EVERY 14 DAYS   VITAMIN D -3 PO Take 1 capsule by mouth daily.        All past medical history, surgical history, allergies, family history, immunizations andmedications were updated in the EMR today and reviewed under the history and medication portions of their EMR.     Review of Systems  Constitutional:  Positive for chills, diaphoresis, fever and malaise/fatigue.  HENT: Negative.    Eyes: Negative.   Respiratory: Negative.    Cardiovascular: Negative.   Gastrointestinal:  Positive for abdominal pain, diarrhea, nausea and vomiting. Negative for blood in stool, constipation and melena.  Genitourinary:  Positive for flank pain and hematuria. Negative for dysuria, frequency and urgency.  Musculoskeletal:  Positive for back pain. Negative for myalgias and neck pain.  Skin:  Negative for rash.  Neurological:  Negative for dizziness and headaches.   Negative, with the exception of above mentioned in HPI   Objective:  BP 118/76   Pulse 92   Temp 98.1 F (36.7 C)   Wt 167 lb 9.6 oz (76 kg)   LMP 01/31/2007   SpO2 (!) 85%   BMI 27.26 kg/m  Body mass index is 27.26 kg/m. Physical Exam Vitals and nursing note reviewed.  Constitutional:      General: She is not in acute distress.    Appearance: Normal appearance. She is normal weight. She is not ill-appearing or toxic-appearing.  HENT:     Head: Normocephalic and atraumatic.  Eyes:     General: No scleral  icterus.       Right eye: No discharge.        Left eye: No discharge.     Extraocular Movements: Extraocular movements intact.     Conjunctiva/sclera: Conjunctivae normal.     Pupils: Pupils are equal, round, and reactive to light.  Abdominal:     General: Abdomen is flat. Bowel sounds are increased. There is no distension.     Palpations: Abdomen is soft. There is no hepatomegaly or mass.     Tenderness: There is abdominal tenderness in the right lower quadrant, periumbilical area, suprapubic area and left lower quadrant. There is right CVA tenderness and left CVA tenderness. There is no guarding or rebound. Negative signs include Murphy's sign and McBurney's sign.  Musculoskeletal:        General: Tenderness present.     Thoracic back: Spasms and tenderness present. No bony tenderness. Decreased range of motion.     Lumbar back: Spasms and tenderness present. Decreased range of motion.  Skin:  Findings: No rash.  Neurological:     Mental Status: She is alert and oriented to person, place, and time. Mental status is at baseline.     Motor: No weakness.     Coordination: Coordination normal.     Gait: Gait normal.  Psychiatric:        Mood and Affect: Mood normal.        Behavior: Behavior normal.        Thought Content: Thought content normal.        Judgment: Judgment normal.     No results found. No results found. No results found for this or any previous visit (from the past 24 hours).   Assessment/Plan: Tiffany Horn is a 63 y.o. female present for OV for  Abdominal pain of multiple sites (Primary)/nausea and vomiting/diarrhea/abdominal cramping Patient with a constellation of symptoms (muscle skeletal, GI and urological.  Will need to obtain lab work today and move forward with a CT abdomen pelvis monitor to better understand the etiology of her symptoms. - CBC w/Diff - Comp Met (CMET) - Lipase - CT ABDOMEN PELVIS W CONTRAST; Future  Abdominal cramping/Abnormal  urine color -Point-of-care urinalysis collected today: Dark yellow, ketones, +1 leuks Urinalysis with reflex culture-pending CT abdomen pelvis-will follow-up colitis, diverticulitis, kidney stone or other abdominal etiology of her pain  Thoracolumbar back pain/Muscle strain Initial onset of back discomfort certainly was from a muscle strain from lifting recalled. She was provided with Zanaflex-encouraged her to continue along with rest and heat application if needed. Cannot rule she has not since developed kidney stones causing more of her lower back ache and GI/urinary complaints Please call patient-I also sent her MyChart message.  Attempted to call without success today.  Lab results CBC, CMP and lipase within normal limits Urinalysis with crystals-culture pending.  Hydrate well.  The evidence of crystals could lead to kidney stone formation.  Currently you do not have a kidney stone  Small bilateral inguinal hernias contain fat. >>>new.  This could be why lower abdominal pressure felt.  If the symptoms continue, or worsen we need to refer to surgery to discuss surgical correction.  Since these are small, may not need surgical intervention you may see symptoms improve over the next couple weeks. Right T7 and T11 compression fractures>>>new T12 compression fraction has been present and old, T7-T11 are new compression fractures Tylenol , calcitonin nasal spray prescribed-1 spray in the nostril daily for 4 weeks. 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 - Usually at this level they may perform some tests and just monitor yearly.  Reviewed expectations re: course of current medical issues. Discussed self-management of symptoms. Outlined signs  and symptoms indicating need for more acute intervention. Patient verbalized understanding and all questions were answered. Patient received an After-Visit Summary.    Orders Placed This Encounter  Procedures   Ambulatory referral to Neurosurgery   Meds ordered this encounter  Medications   calcitonin, salmon, (MIACALCIN/FORTICAL) 200 UNIT/ACT nasal spray    Sig: Place 1 spray into alternate nostrils daily.    Dispense:  3.7 mL    Refill:  1   buPROPion  (WELLBUTRIN  XL) 150 MG 24 hr tablet    Sig: Take 1 tablet (150 mg total) by mouth daily.    Dispense:  90 tablet    Refill:  1   desvenlafaxine  (PRISTIQ ) 100 MG 24 hr tablet    Sig: Take 1 tablet (100 mg total) by mouth daily.    Dispense:  90 tablet    Refill:  1   clopidogrel  (PLAVIX ) 75 MG tablet    Sig: Take 1 tablet (75 mg total) by mouth daily.    Dispense:  90 tablet    Refill:  1   tiZANidine (ZANAFLEX) 4 MG capsule    Sig: Take 1 capsule (4 mg total) by mouth 3 (three) times daily.    Dispense:  90 capsule    Refill:  5   clonazePAM  (KLONOPIN ) 0.5 MG tablet    Sig: Take 1 tablet (0.5 mg total) by mouth 2 (two) times daily as needed for anxiety.    Dispense:  180 tablet    Refill:  1   HYDROcodone -acetaminophen  (NORCO) 10-325 MG tablet    Sig: Take 1 tablet by mouth every 8 (eight) hours as needed for up to 7 days for moderate pain (pain score 4-6) or severe pain (pain score 7-10).    Dispense:  21 tablet    Refill:  0   Referral Orders         Ambulatory referral to Neurosurgery     I personally spent a total of 42 minutes in the care of the patient today including preparing to see the patient, getting/reviewing separately obtained history, performing a medically appropriate exam/evaluation, counseling and educating, placing orders, referring and communicating with other health care professionals, documenting clinical information in the EHR, independently interpreting results, communicating results, and  coordinating care.   Note is dictated utilizing voice recognition software. Although note has been proof read prior to signing, occasional typographical errors still can be missed. If any questions arise, please do not hesitate to call for verification.   electronically signed by:  Charlies Bellini, DO  [facility] - OR

## 2023-12-19 NOTE — Patient Instructions (Signed)
 Information for Referral #: [id number]   Diagnoses:   K76.0 (ICD-10-CM) - Hepatic steatosis   Procedures: MZQ74 (Custom) - AMB REFERRAL TO GASTROENTEROLOGY Authorization #:     Referring Provider Information Charlies DELENA Bellini [address] [place] [address]  [phone] Referring To Provider Information [facility] [address] [place] [address] [phone]  Referral Start Date: 12/18/2023 Referral End Date: 12/17/2024

## 2023-12-31 NOTE — Telephone Encounter (Signed)
 No further action required

## 2024-01-01 ENCOUNTER — Telehealth: Payer: Self-pay | Admitting: Family Medicine

## 2024-01-01 NOTE — Telephone Encounter (Unsigned)
 Copied from CRM [id number]. Topic: Clinical - Medication Refill >> Jan 01, 2024 12:52 PM Jeshua R wrote: Medication: HYDROcodone -Acetaminophen  10-325 MG, possibly a higher dose, or an alternative as this is not helping with pain, until appt on Monday.  Has the patient contacted their pharmacy? Yes  This is the patient's preferred pharmacy:  [id number] - [place], [place] - [address] [place] [address] [place] [address] [place] [address] Phone: [phone] Fax: [phone]  Is this the correct pharmacy for this prescription? Yes If no, delete pharmacy and type the correct one.   Has the prescription been filled recently? Yes  Is the patient out of the medication? Yes  Has the patient been seen for an appointment in the last year OR does the patient have an upcoming appointment? Yes  Can we respond through MyChart? No, phonecall  Agent: Please be advised that Rx refills may take up to 3 business days. We ask that you follow-up with your pharmacy.

## 2024-01-02 ENCOUNTER — Other Ambulatory Visit: Payer: Self-pay

## 2024-01-02 NOTE — Telephone Encounter (Signed)
 Copied from CRM [id number]. Topic: Clinical - Medication Refill >> Jan 01, 2024 12:52 PM Ashley R wrote: Medication: HYDROcodone -Acetaminophen  10-325 MG, possibly a higher dose, or an alternative as this is not helping with pain, until appt on Monday.   Has the patient contacted their pharmacy? Yes   This is the patient's preferred pharmacy:  [id number] - [place], [place] - [address] [place] [address] [place] [address] [place] [address] Phone: [phone] Fax: [phone]   Is this the correct pharmacy for this prescription? Yes If no, delete pharmacy and type the correct one.    Has the prescription been filled recently? Yes   Is the patient out of the medication? Yes   Has the patient been seen for an appointment in the last year OR does the patient have an upcoming appointment? Yes   Can we respond through MyChart? No, phonecall   Agent: Please be advised that Rx refills may take up to 3 business days. We ask that you follow-up with your pharmacy.

## 2024-01-02 NOTE — Telephone Encounter (Signed)
 Refill not appropriate.

## 2024-01-02 NOTE — Telephone Encounter (Signed)
 Requesting: hydrocodone  Contract:n/a UDS:n/a Last Visit:12/17/23 Next Visit:02/26/24 Last Refill:12/19/23 (21,0)  Please Advise

## 2024-01-02 NOTE — Telephone Encounter (Signed)
 See other encounter

## 2024-01-03 ENCOUNTER — Other Ambulatory Visit: Payer: Self-pay | Admitting: Family Medicine

## 2024-01-09 NOTE — Telephone Encounter (Signed)
 No further action needed.

## 2024-01-10 ENCOUNTER — Ambulatory Visit: Admitting: Gastroenterology

## 2024-01-10 VITALS — BP 100/68 | HR 81 | Ht 66.0 in | Wt 167.0 lb

## 2024-01-10 DIAGNOSIS — R1030 Lower abdominal pain, unspecified: Secondary | ICD-10-CM | POA: Diagnosis not present

## 2024-01-10 DIAGNOSIS — R634 Abnormal weight loss: Secondary | ICD-10-CM | POA: Insufficient documentation

## 2024-01-10 DIAGNOSIS — R110 Nausea: Secondary | ICD-10-CM

## 2024-01-10 DIAGNOSIS — R197 Diarrhea, unspecified: Secondary | ICD-10-CM

## 2024-01-10 HISTORY — DX: Abnormal weight loss: R63.4

## 2024-01-10 MED ORDER — NA SULFATE-K SULFATE-MG SULF 17.5-3.13-1.6 GM/177ML PO SOLN: 1.0000 | Freq: Once | ORAL | 0 refills | Status: AC

## 2024-01-10 MED ORDER — HYOSCYAMINE SULFATE 0.125 MG SL SUBL: 0.1250 mg | SUBLINGUAL_TABLET | SUBLINGUAL | 0 refills | Status: AC | PRN

## 2024-01-10 NOTE — Patient Instructions (Signed)
 We have sent the following medications to your pharmacy for you to pick up at your convenience: Levsin 0.125 mg SL every 6-8 hours as needed.   You have been scheduled for a colonoscopy. Please follow written instructions given to you at your visit today.   If you use inhalers (even only as needed), please bring them with you on the day of your procedure.  DO NOT TAKE 7 DAYS PRIOR TO TEST- Trulicity (dulaglutide) Ozempic, Wegovy (semaglutide) Mounjaro, Zepbound (tirzepatide) Bydureon Bcise (exanatide extended release)  DO NOT TAKE 1 DAY PRIOR TO YOUR TEST Rybelsus (semaglutide) Adlyxin (lixisenatide) Victoza (liraglutide) Byetta (exanatide) ___________________________________________________________________________

## 2024-01-10 NOTE — Progress Notes (Signed)
 01/10/2024 Tiffany Horn [id number] 11-08-1960   Discussed the use of AI scribe software for clinical note transcription with the patient, who gave verbal consent to proceed.  History of Present Illness Tiffany Horn is a 63 year old female who presents with abdominal pain and significant weight loss. She is a patient of Dr. Trenna.  She has been experiencing severe abdominal pain across her lower abdomen for three to four weeks. The pain is intense, occurs multiple times a day, and is accompanied by nausea. Although the pain is severe enough to induce a sensation of vomiting, she rarely vomits. The pain is related to eating, has not been eating much, but does not disturb her sleep.  She has lost 15 pounds in under a month (according to her report from her scale at home), which she attributes to a decreased appetite and inability to consume more than a small cup of applesauce daily. Her appetite has drastically changed, and she no longer desires to eat, which is unusual for her.  Her bowel movements are daily, with minimal diarrhea. She has daily bowel movements, but they are sometimes accompanied by a sensation of needing to vomit or defecate without result. No blood in stool but reports nausea with pain.  She has a history of irritable bowel syndrome (IBS) with intermittent symptoms over the years, but describes the current episode as constant and severe. She has been taking Zofran  for nausea and recently started on nortriptyline 10 mg for headaches, which she takes at bedtime.  Has had both EGD and colonoscopy within the past 6 months.  She has ongoing concern about her symptoms.  Results LABS Liver function tests: Normal Platelet count: Normal Hgb:  Normal  RADIOLOGY Abdominal CT: Adrenal glands and kidneys unremarkable, liver steatotic, may be mildly cirrhotic, no spots on liver, compression fractures, bilateral groin hernias with fat, no bowel involvement, stomach,  small bowel, appendix, colon normal, hiatal hernia, right middle lobe lung nodule benign (12/17/2023)  Colonoscopy 07/2022 EGD 12/2022  Past Medical History:  Diagnosis Date   Allergy     Anxiety    Arthritis    knees and spine, shoulder   Asthma    Bronchitis    hx - recurrent   Complication of anesthesia    waking up is not easy   Depression    Elevated IgE level 09/12/2017   09/12/2017 IgE 195   GERD (gastroesophageal reflux disease)    History of COVID-19 12/30/2020   Hx of irritable bowel syndrome    x2   Hyperlipidemia    diet controlled - no medication   Hypertension    not taking any meds at present - under control per patient   Hypokalemia    with PNA admission (2.5)   Hypothyroidism (acquired)    Ischemic cerebrovascular accident (CVA) (HCC)    Migraines    Osteoporosis    Pneumonia    4 episodes; hosp. admission 2014   PONV (postoperative nausea and vomiting)    TIA (transient ischemic attack) 12/17/2020   [facility]ulcerative colitis) [facility])    DX'D 2021   Past Surgical History:  Procedure Laterality Date   ANEURYSM COILING     STENT   BREAST SURGERY     implants, then had them removed   COLONOSCOPY     greater 10 yrs ago - ? [facility]-2017 LAST   DILATION AND CURETTAGE OF UTERUS     IR ANGIO INTRA EXTRACRAN SEL INTERNAL CAROTID BILAT MOD  SED  11/19/2020   IR ANGIO VERTEBRAL SEL VERTEBRAL BILAT MOD SED  11/19/2020   IR ANGIO VERTEBRAL SEL VERTEBRAL UNI L MOD SED  02/25/2021   IR ANGIO VERTEBRAL SEL VERTEBRAL UNI L MOD SED  01/13/2022   IR ANGIO VERTEBRAL SEL VERTEBRAL UNI L MOD SED  12/22/2022   IR ANGIOGRAM FOLLOW UP STUDY  11/19/2020   IR ANGIOGRAM FOLLOW UP STUDY  11/19/2020   IR ANGIOGRAM FOLLOW UP STUDY  11/19/2020   IR ANGIOGRAM FOLLOW UP STUDY  11/19/2020   IR ANGIOGRAM FOLLOW UP STUDY  11/19/2020   IR ANGIOGRAM FOLLOW UP STUDY  11/19/2020   IR ANGIOGRAM FOLLOW UP STUDY  11/19/2020   IR ANGIOGRAM FOLLOW UP STUDY  11/19/2020   IR INTRA  CRAN STENT  11/19/2020   IR TRANSCATH/EMBOLIZ  11/19/2020   LAPAROSCOPIC ABDOMINAL EXPLORATION  01/31/1992   endometriosis   ORIF HUMERUS FRACTURE Left 04/01/2013   DR VERNETTA - shoulder   ORIF HUMERUS FRACTURE Left 04/01/2013   Procedure: OPEN REDUCTION INTERNAL FIXATION (ORIF) LEFT PROXIMAL HUMERUS FRACTURE;  Surgeon: Lonni CINDERELLA Vernetta, MD;  Location: [facility] OR;  Service: Orthopedics;  Laterality: Left;   RADIOLOGY WITH ANESTHESIA N/A 11/19/2020   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;  Surgeon: Lanis Pupa, MD;  Location: [facility] OR;  Service: Radiology;  Laterality: N/A;   TONSILLECTOMY AND ADENOIDECTOMY      reports that she has never smoked. She has never been exposed to tobacco smoke. She has never used smokeless tobacco. She reports current alcohol use of about 1.0 standard drink of alcohol per week. She reports that she does not use drugs. family history includes Breast cancer in her mother; Colon cancer in her father; Dementia in her father; Diabetes in her father, mother, and paternal grandmother; Heart disease in her mother; Pancreatic cancer in her maternal grandfather; Stroke in her paternal grandmother. Allergies[1]    Outpatient Encounter Medications as of 01/10/2024  Medication Sig   amLODipine  (NORVASC ) 10 MG tablet Take 1 tablet (10 mg total) by mouth daily.   aspirin  EC 81 MG tablet Take 81 mg by mouth daily. Swallow whole.   atorvastatin  (LIPITOR ) 80 MG tablet Take 1 tablet (80 mg total) by mouth daily.   Budeson-Glycopyrrol-Formoterol  (BREZTRI  AEROSPHERE) 160-9-4.8 MCG/ACT AERO Inhale 2 puffs into the lungs in the morning and at bedtime. (Patient taking differently: Inhale 2 puffs into the lungs in the morning and at bedtime. prn)   buPROPion  (WELLBUTRIN  XL) 150 MG 24 hr tablet Take 1 tablet (150 mg total) by mouth daily.   calcitonin, salmon, (MIACALCIN/FORTICAL) 200 UNIT/ACT nasal spray Place 1 spray into alternate nostrils daily.    Calcium -Magnesium -Vitamin D  (CALCIUM  MAGNESIUM  PO) Take by mouth.   Cholecalciferol  (VITAMIN D -3 PO) Take 1 capsule by mouth daily.   clonazePAM  (KLONOPIN ) 0.5 MG tablet Take 1 tablet (0.5 mg total) by mouth 2 (two) times daily as needed for anxiety.   Cyanocobalamin  (B-12) 1000 MCG SUBL Place 1,000 mcg under the tongue daily.   desvenlafaxine  (PRISTIQ ) 100 MG 24 hr tablet Take 1 tablet (100 mg total) by mouth daily.   ezetimibe  (ZETIA ) 10 MG tablet Take 1 tablet (10 mg total) by mouth every evening.   folic acid  (FOLVITE ) 1 MG tablet Take 1 tablet (1 mg total) by mouth daily.   levothyroxine  (SYNTHROID ) 150 MCG tablet 1 tab PO daily x6 days a week, 1.5 tabs PO 1 day a week   Multiple Vitamin (MULTIVITAMIN) tablet Take 1 tablet by mouth daily.  nortriptyline (PAMELOR) 10 MG capsule Take 10 mg by mouth at bedtime.   ondansetron  (ZOFRAN ) 4 MG tablet TAKE 1 TABLET BY MOUTH EVERY 8 HOURS AS NEEDED FOR NAUSEA AND VOMITING   pantoprazole  (PROTONIX ) 40 MG tablet Take 1 tablet (40 mg total) by mouth daily.   sucralfate  (CARAFATE ) 1 g tablet Take 1 tablet (1 g total) by mouth 4 (four) times daily -  with meals and at bedtime.   Vitamin D , Ergocalciferol , (DRISDOL ) 1.25 MG (50000 UNIT) CAPS capsule TAKE 1 CAPSULE (50,000 UNITS TOTAL) BY MOUTH EVERY 14 DAYS   clopidogrel  (PLAVIX ) 75 MG tablet Take 1 tablet (75 mg total) by mouth daily. (Patient not taking: Reported on 01/10/2024)   [DISCONTINUED] progesterone (PROMETRIUM) 100 MG capsule Take by mouth. (Patient not taking: Reported on 12/19/2023)   [DISCONTINUED] tiZANidine  (ZANAFLEX ) 4 MG capsule Take 1 capsule (4 mg total) by mouth 3 (three) times daily.   No facility-administered encounter medications on file as of 01/10/2024.     REVIEW OF SYSTEMS  : All other systems reviewed and negative except where noted in the History of Present Illness.   PHYSICAL EXAM: BP 100/68   Pulse 81   Ht 5' 6 (1.676 m)   Wt 167 lb (75.8 kg)   LMP 01/31/2007    SpO2 95%   BMI 26.95 kg/m  General: Well developed white female in no acute distress Head: Normocephalic and atraumatic Eyes:  Sclerae anicteric, conjunctiva pink. Ears: Normal auditory acuity Lungs: Clear throughout to auscultation; no W/R/R. Heart: Regular rate and rhythm; no M/R/G. Abdomen: Soft, non-distended.  BS present.  Mild diffuse lower abdominal TTP. Musculoskeletal: Symmetrical with no gross deformities  Skin: No lesions on visible extremities Neurological: Alert oriented x 4, grossly non-focal Psychological:  Alert and cooperative. Normal mood and affect  Assessment & Plan Abdominal pain with gastrointestinal symptoms and unintentional weight loss Severe lower abdominal pain with nausea, minimal diarrhea, and significant weight loss of 15 pounds all present over the past 3-4 weeks (per patient report). CT scan shows no concerning masses or malignancy. Differential includes irritable bowel syndrome and post-infectious irritable bowel syndrome. Recent onset of symptoms with no new medications except nortriptyline. No blood in stool, no nocturnal symptoms. Recent episode of vomiting and diarrhea that proceeded all of these ongoing symptoms, possibly post-infectious. - Scheduled colonoscopy for January to be completed if no improvement/resolution of symptoms by that time. - Prescribed hyoscyamine for intestinal spasm and cramping. - Continue Zofran  for nausea.  Irritable bowel syndrome Chronic irritable bowel syndrome with recent exacerbation of symptoms including abdominal pain and changes in bowel habits. 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 - Prescribed hyoscyamine for intestinal spasm and cramping. - Continue Zofran  for nausea.  Fatty liver disease Steatotic liver noted on CT scan, possibly mildly cirrhotic. Liver function tests are normal,  platelets normal. No lesions on liver. Advised on lifestyle modifications including diet and exercise to manage fatty liver. - Advised on lifestyle modifications including diet and exercise to manage fatty liver.   CC:  Kuneff, Renee A, DO       [1]  Allergies Allergen Reactions   Abilify  [Aripiprazole ] Nausea And Vomiting   Amoxil [Amoxicillin] Diarrhea   Promethazine  Hcl Other (See Comments)    Made pt feel weird   Seroquel [Quetiapine Fumarate] Other (See Comments)    Pt does not recall reaction

## 2024-01-11 ENCOUNTER — Other Ambulatory Visit: Payer: Self-pay | Admitting: Family Medicine

## 2024-01-11 DIAGNOSIS — S22000A Wedge compression fracture of unspecified thoracic vertebra, initial encounter for closed fracture: Secondary | ICD-10-CM

## 2024-01-13 ENCOUNTER — Other Ambulatory Visit: Payer: Self-pay | Admitting: Family Medicine

## 2024-01-14 ENCOUNTER — Ambulatory Visit: Admitting: Family Medicine

## 2024-01-14 ENCOUNTER — Encounter: Payer: Self-pay | Admitting: Family Medicine

## 2024-01-14 VITALS — BP 118/82 | HR 81 | Temp 98.0°F | Wt 166.0 lb

## 2024-01-14 DIAGNOSIS — F419 Anxiety disorder, unspecified: Secondary | ICD-10-CM

## 2024-01-14 DIAGNOSIS — E538 Deficiency of other specified B group vitamins: Secondary | ICD-10-CM | POA: Diagnosis not present

## 2024-01-14 DIAGNOSIS — G43909 Migraine, unspecified, not intractable, without status migrainosus: Secondary | ICD-10-CM | POA: Diagnosis not present

## 2024-01-14 DIAGNOSIS — E559 Vitamin D deficiency, unspecified: Secondary | ICD-10-CM

## 2024-01-14 DIAGNOSIS — M810 Age-related osteoporosis without current pathological fracture: Secondary | ICD-10-CM | POA: Diagnosis not present

## 2024-01-14 DIAGNOSIS — E782 Mixed hyperlipidemia: Secondary | ICD-10-CM | POA: Diagnosis not present

## 2024-01-14 DIAGNOSIS — R7983 Abnormal findings of blood amino-acid level: Secondary | ICD-10-CM

## 2024-01-14 DIAGNOSIS — I725 Aneurysm of other precerebral arteries: Secondary | ICD-10-CM

## 2024-01-14 DIAGNOSIS — F331 Major depressive disorder, recurrent, moderate: Secondary | ICD-10-CM | POA: Diagnosis not present

## 2024-01-14 DIAGNOSIS — I10 Essential (primary) hypertension: Secondary | ICD-10-CM | POA: Diagnosis not present

## 2024-01-14 DIAGNOSIS — E034 Atrophy of thyroid (acquired): Secondary | ICD-10-CM | POA: Diagnosis not present

## 2024-01-14 DIAGNOSIS — Z8673 Personal history of transient ischemic attack (TIA), and cerebral infarction without residual deficits: Secondary | ICD-10-CM

## 2024-01-14 MED ORDER — CARIPRAZINE HCL 1.5 MG PO CAPS: 1.5000 mg | ORAL_CAPSULE | ORAL | 5 refills | Status: AC

## 2024-01-14 NOTE — Progress Notes (Signed)
 Tiffany Horn , 24-Oct-1960, 63 y.o., female MRN: [id number] Patient Care Team    Relationship Specialty Notifications Start End  Catherine Charlies LABOR, DO PCP - General Family Medicine  08/21/22   Shila Gustav GAILS, MD Consulting Physician Gastroenterology  05/29/16   Olegario Messier, MD Referring Physician Obstetrics and Gynecology  07/17/17   Brenna Adine LITTIE, DO Consulting Physician Pulmonary Disease  06/12/19   Cathlyn JAYSON Nikki Bobie FORBES, MD Consulting Physician Obstetrics and Gynecology  08/30/22   Rosemarie Eather RAMAN, MD Consulting Physician Neurology  07/11/23   Jeffrie Oneil JAYSON, MD Consulting Physician Cardiology  07/11/23     Chief Complaint  Patient presents with   Depression   Anxiety    Pt feels Pristiq  and Wellbutrin  are not working properly.      Subjective: Tiffany Horn is a  63 y.o. Pt presents for an OV  Major depressive disorder, recurrent episode, moderate (HCC)/Anxiety Patient is present today to discuss her recurrent depression and anxiety.  She has highs and lows with her anxiety and depression.  She has had difficulty finding medication seems to keep her balanced.  She does not endorse manic episodes.  We have completed GeneSight testing on her.  Prior note Patient reports she saw a interventional med provider, to see if she could assist with her gut health.  Patient reports she had a large amount of labs collected and when reviewed physician was more worried about her brain health and gut health.  They discussed her depression and anxiety, in which patient shared with her that the Lexapro  has caused her to have no sex drive which is a concern for her.  Physician then gave her instructions on tapering off of Lexapro  and Pristiq , and told to start Wellbutrin  150 XL.  Patient reports she feels the worst that she has in the past 10 years since doing so.  She did start a supplement for weight and hormones called happy [employer]amm[employer].  Provider also prescribed her progesterone, which  she has not started due to fear considering her past medical history with TIAs, migraines and CVA.  She presents today to discuss possible medication change. Patient reports compliance with Klonopin  0.5 mg twice daily as needed Prior note Patient presents today and states that the Pristiq  50 mg is not enough to cover her anxiety well.  She has been tried on Pristiq  100 mg, but that was not covering her anxiety well.  Pristiq  was then decreased to 500 was added.  Unfortunately she had reaction to Viibryd .  Pristiq  50 mg was continued, at the follow-up visit patient felt that she was doing okay.  Today however she feels like Pristiq  50 is not enough.  She has heard about medication called Lexapro  and is wondering if that would be a good fit for her.  Patient has had GeneSight testing, Viibryd  and Pristiq  are the preferred and Lexapro  is in the moderate range.  Patient is aware.  Prior note: At the last appointment patient was weaning off the Pristiq  and started the Viibryd  due to reports of inadequate coverage.  However after the Pristiq  was completely tapered off she endorsed having panic and could not sleep.  She was seen by another provider which took her off the Viibryd  and restarted Pristiq  at 50 mg a day, and since that time she states she is doing really well.  She is no longer taking the BuSpar  either  Prior note: Patient reports she feels about the same  as she did on her prior medication regimens and does not see great improvement on the Pristiq /BuSpar  regimen, despite acknowledging the recommendations of best suited meds for her by GeneSight testing.  She is attending therapy twice a week.  Has tapered up to the Pristiq  100 mg daily and BuSpar  10 mg twice daily for about 5 weeks now.  She reports she is getting out and walking daily.  She has been socializing with her friends and not isolating.  She has been hit with some unfortunate circumstances where she has lost her job and is looking for a new  job.  She had a friend passed away from colon cancer unexpectedly.  One of her 29 year old dogs recently died.  She has been in contact with her ex-boyfriend and is considering getting back together with him.   Hypothyroidism: Patient reports compliance with levothyroxine  150 daily, there has not been a refill in quite some time for pharmacy review.  TSH reviewed from labs collected in August by other provider and was mildly under supplemented. Osteoporosis: Patient reports she is scheduled with osteoporosis clinic.  Did not tolerate Fosamax . Hypertension/hyperlipidemia: Patient states that she is taking the Lipitor , but not the Zetia .  There has not been a Lipitor  refill in quite some time per pharmacy review.  Elevated cholesterol on reviewed labs from 08/2023 Patient reports compliance with amlodipine  10 mg daily. Patient denies chest pain, shortness of breath, dizziness or lower extremity edema.    09/18/2023 [facility] labs: CBC with differential-WNL CMP-WNL Total cholesterol 254, HDL-P30.5, small LDL-P 1299 HDL-C 54, triglycerides 140, LDL-C 175 APO E Alzheimer's risk-negative Antidiuretic hormone 4.3-within normal limits Osmolality 289-within normal limits MTHFR-result not associated with an increased risk for hyperhomocysteinemia A1c 5.6 TSH 4.7-normal parameter is less than 4.5 with this lab.  T4 free within normal limits DHEA-sulfate within normal limits (low normal) Vitamin A within normal limits vitamin D  within normal limits, low normal at 31.1 Leptin 35.7 Uric acid 6.4  ferritin 65  CRP-normal melanocyte stimulating hormone within normal levels magnesium , RBC-6.2 within normal limits homocystine 8.2-within normal limits    12/17/2023    1:46 PM 09/19/2023    1:48 PM 11/03/2022    1:30 PM 10/13/2022    9:01 AM 09/05/2022    2:08 PM  Depression screen PHQ 2/9  Decreased Interest 2 0 1 2 2   Down, Depressed, Hopeless 2 1 1 3 3   PHQ - 2 Score 4 1 2 5 5   Altered sleeping 2  2 2 3    Tired, decreased energy 2  1 3 2   Change in appetite 1  0 3 3  Feeling bad or failure about yourself  0  0 2 3  Trouble concentrating 0  1 3 2   Moving slowly or fidgety/restless 0  0 2 3  Suicidal thoughts 0  0 0 1  PHQ-9 Score 9  6  20  22    Difficult doing work/chores Somewhat difficult  Somewhat difficult Very difficult Very difficult     Data saved with a previous flowsheet row definition      01/05/2022    8:02 AM 01/13/2022    9:05 AM 07/05/2022    9:33 AM 09/19/2023    1:48 PM 12/17/2023    1:46 PM  Fall Risk  Falls in the past year?   0 0 0  Was there an injury with Fall?   0  0  0   Fall Risk Category Calculator   0 0 0  (RETIRED)  Patient Fall Risk Level Low fall risk  Low fall risk      Patient at Risk for Falls Due to   No Fall Risks No Fall Risks   Fall risk Follow up   Follow up appointment Falls evaluation completed Falls evaluation completed     Data saved with a previous flowsheet row definition    Allergies  Allergen Reactions   Abilify  [Aripiprazole ] Nausea And Vomiting   Amoxil [Amoxicillin] Diarrhea   Fosamax  [Alendronate ] Other (See Comments)    GI upset   Promethazine  Hcl Other (See Comments)    Made pt feel weird   Seroquel [Quetiapine Fumarate] Other (See Comments)    Pt does not recall reaction   Social History   Social History Narrative   She is originally from [place]. She has traveled to [facility], [place], [place] , [place], [place], [place], [place], [place], [place] ,& [place]. No international travel. She has dogs. No prior bird, mold, or recent hot tub exposure. She hasn't used her hot tub in 1.5 years. 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 Previously enjoyed gardening and playing tennis. Helps to care for her mother.   Past Medical History:  Diagnosis Date   Allergy     Anxiety    Arthritis    knees and spine, shoulder   Asthma    Bronchitis    hx - recurrent   Complication of anesthesia    waking up is not easy    Depression    Elevated IgE level 09/12/2017   09/12/2017 IgE 195   GERD (gastroesophageal reflux disease)    History of COVID-19 12/30/2020   Hx of irritable bowel syndrome    x2   Hyperlipidemia    diet controlled - no medication   Hypertension    not taking any meds at present - under control per patient   Hypokalemia    with [facility] admission (2.5)   Hypothyroidism (acquired)    Intractable headache 01/04/2022   Ischemic cerebrovascular accident (CVA) (HCC)    Loss of weight 01/10/2024   Lower abdominal pain 12/17/2023   Migraines    Muscle strain 12/17/2023   Osteoporosis    Pneumonia    4 episodes; hosp. admission 2014   PONV (postoperative nausea and vomiting)    TIA (transient ischemic attack) 12/17/2020   UC (ulcerative colitis) [facility])    DX'D 2021   Past Surgical History:  Procedure Laterality Date   ANEURYSM COILING     STENT   BREAST SURGERY     implants, then had them removed   COLONOSCOPY     greater 10 yrs ago - ? [facility]-2017 LAST   DILATION AND CURETTAGE OF UTERUS     IR ANGIO INTRA EXTRACRAN SEL INTERNAL CAROTID BILAT MOD SED  11/19/2020   IR ANGIO VERTEBRAL SEL VERTEBRAL BILAT MOD SED  11/19/2020   IR ANGIO VERTEBRAL SEL VERTEBRAL UNI L MOD SED  02/25/2021   IR ANGIO VERTEBRAL SEL VERTEBRAL UNI L MOD SED  01/13/2022   IR ANGIO VERTEBRAL SEL VERTEBRAL UNI L MOD SED  12/22/2022   IR ANGIOGRAM FOLLOW UP STUDY  11/19/2020   IR ANGIOGRAM FOLLOW UP STUDY  11/19/2020   IR ANGIOGRAM FOLLOW UP STUDY  11/19/2020   IR ANGIOGRAM FOLLOW UP STUDY  11/19/2020   IR ANGIOGRAM FOLLOW UP STUDY  11/19/2020   IR ANGIOGRAM FOLLOW UP STUDY  11/19/2020   IR ANGIOGRAM FOLLOW UP STUDY  11/19/2020  IR ANGIOGRAM FOLLOW UP STUDY  11/19/2020   IR INTRA CRAN STENT  11/19/2020   IR TRANSCATH/EMBOLIZ  11/19/2020   LAPAROSCOPIC ABDOMINAL EXPLORATION  01/31/1992   endometriosis   ORIF HUMERUS FRACTURE Left 04/01/2013   DR VERNETTA - shoulder   ORIF HUMERUS FRACTURE Left  04/01/2013   Procedure: OPEN REDUCTION INTERNAL FIXATION (ORIF) LEFT PROXIMAL HUMERUS FRACTURE;  Surgeon: Lonni CINDERELLA Vernetta, MD;  Location: [facility] OR;  Service: Orthopedics;  Laterality: Left;   RADIOLOGY WITH ANESTHESIA N/A 11/19/2020   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;  Surgeon: Lanis Pupa, MD;  Location: [facility] OR;  Service: Radiology;  Laterality: N/A;   TONSILLECTOMY AND ADENOIDECTOMY     Family History  Problem Relation Age of Onset   Breast cancer Mother    Diabetes Mother    Heart disease Mother    Diabetes Father    Dementia Father    Colon cancer Father    Pancreatic cancer Maternal Grandfather    Stroke Paternal Grandmother    Diabetes Paternal Grandmother    Colon polyps Neg Hx    Esophageal cancer Neg Hx    Stomach cancer Neg Hx    Rectal cancer Neg Hx    Crohn's disease Neg Hx    Ulcerative colitis Neg Hx    Allergies as of 01/14/2024       Reactions   Abilify  [aripiprazole ] Nausea And Vomiting   Amoxil [amoxicillin] Diarrhea   Fosamax  [alendronate ] Other (See Comments)   GI upset   Promethazine  Hcl Other (See Comments)   Made pt feel weird   Seroquel [quetiapine Fumarate] Other (See Comments)   Pt does not recall reaction        Medication List        Accurate as of January 14, 2024  3:06 PM. If you have any questions, ask your nurse or doctor.          STOP taking these medications    clopidogrel  75 MG tablet Commonly known as: PLAVIX  Stopped by: Charlies Bellini, DO       TAKE these medications    amLODipine  10 MG tablet Commonly known as: NORVASC  Take 1 tablet (10 mg total) by mouth daily.   aspirin  EC 81 MG tablet Take 81 mg by mouth daily. Swallow whole.   atorvastatin  80 MG tablet Commonly known as: LIPITOR  Take 1 tablet (80 mg total) by mouth daily.   B-12 1000 MCG Subl Place 1,000 mcg under the tongue daily.   Breztri  Aerosphere 160-9-4.8 MCG/ACT Aero inhaler Generic drug:  budesonide -glycopyrrolate -formoterol  Inhale 2 puffs into the lungs in the morning and at bedtime. What changed: additional instructions   buPROPion  150 MG 24 hr tablet Commonly known as: WELLBUTRIN  XL Take 1 tablet (150 mg total) by mouth daily.   calcitonin (salmon) 200 UNIT/ACT nasal spray Commonly known as: MIACALCIN/FORTICAL Place 1 spray into alternate nostrils daily.   CALCIUM  MAGNESIUM  PO Take by mouth.   cariprazine  1.5 MG capsule Commonly known as: VRAYLAR  Take 1 capsule (1.5 mg total) by mouth daily. Started by: Charlies Bellini, DO   clonazePAM  0.5 MG tablet Commonly known as: KLONOPIN  Take 1 tablet (0.5 mg total) by mouth 2 (two) times daily as needed for anxiety.   desvenlafaxine  100 MG 24 hr tablet Commonly known as: Pristiq  Take 1 tablet (100 mg total) by mouth daily.   ezetimibe  10 MG tablet Commonly known as: Zetia  Take 1 tablet (10 mg total) by mouth every evening.   folic acid  1 MG  tablet Commonly known as: FOLVITE  Take 1 tablet (1 mg total) by mouth daily.   hydrOXYzine  10 MG tablet Commonly known as: ATARAX  Take 10-30 mg by mouth.   hyoscyamine  0.125 MG SL tablet Commonly known as: LEVSIN  SL Place 1 tablet (0.125 mg total) under the tongue every 4 (four) hours as needed.   levothyroxine  150 MCG tablet Commonly known as: Synthroid  1 tab PO daily x6 days a week, 1.5 tabs PO 1 day a week   multivitamin tablet Take 1 tablet by mouth daily.   nortriptyline 10 MG capsule Commonly known as: PAMELOR Take 10 mg by mouth at bedtime.   ondansetron  4 MG tablet Commonly known as: ZOFRAN  TAKE 1 TABLET BY MOUTH EVERY 8 HOURS AS NEEDED FOR NAUSEA AND VOMITING   pantoprazole  40 MG tablet Commonly known as: PROTONIX  Take 1 tablet (40 mg total) by mouth daily.   sucralfate  1 g tablet Commonly known as: Carafate  Take 1 tablet (1 g total) by mouth 4 (four) times daily -  with meals and at bedtime.   Vitamin D  (Ergocalciferol ) 1.25 MG (50000 UNIT) Caps  capsule Commonly known as: DRISDOL  TAKE 1 CAPSULE (50,000 UNITS TOTAL) BY MOUTH EVERY 14 DAYS   VITAMIN D -3 PO Take 1 capsule by mouth daily.        All past medical history, surgical history, allergies, family history, immunizations andmedications were updated in the EMR today and reviewed under the history and medication portions of their EMR.     ROS Negative, with the exception of above mentioned in HPI   Objective:  BP 118/82   Pulse 81   Temp 98 F (36.7 C)   Wt 166 lb (75.3 kg)   LMP 01/31/2007   SpO2 98%   BMI 26.79 kg/m  Body mass index is 26.79 kg/m. Physical Exam Vitals and nursing note reviewed.  Constitutional:      General: She is not in acute distress.    Appearance: Normal appearance. She is normal weight. She is not ill-appearing or toxic-appearing.     Comments: Looks very well today.  Smiling and appears happy  HENT:     Head: Normocephalic and atraumatic.  Eyes:     General: No scleral icterus.       Right eye: No discharge.        Left eye: No discharge.     Extraocular Movements: Extraocular movements intact.     Conjunctiva/sclera: Conjunctivae normal.     Pupils: Pupils are equal, round, and reactive to light.  Cardiovascular:     Rate and Rhythm: Normal rate.  Skin:    Findings: No rash.  Neurological:     Mental Status: She is alert and oriented to person, place, and time. Mental status is at baseline.     Motor: No weakness.     Coordination: Coordination normal.     Gait: Gait normal.  Psychiatric:        Mood and Affect: Mood normal.        Behavior: Behavior normal.        Thought Content: Thought content normal.        Judgment: Judgment normal.    No results found. No results found. No results found for this or any previous visit (from the past 24 hours).  Assessment/Plan: Tiffany Horn is a 64 y.o. female present for OV for  Major depressive disorder, recurrent episode, mild (HCC)/anxiety Lengthy discussion  surrounding medication changes. Continue Pristiq  100 mg daily-she felt the best  when on Pristiq  Continue Klonopin  twice daily as needed for now.  [place]  controlled substance database reviewed and appropriate Will continue Wellbutrin  for now, they will likely attempt to wean off Wellbutrin  if she tolerates Vraylar  Start Vraylar  1.5 mg daily-considering tapering up on Vraylar  and weaning off Wellbutrin  if able. 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 Continue folate and B12 supplements   Reviewed expectations re: course of current medical issues. Discussed self-management of symptoms. Outlined signs and symptoms indicating need for more acute intervention. Patient verbalized understanding and all questions were answered. Patient received an After-Visit Summary.  Return in about 5 weeks (around 02/18/2024) for Routine chronic condition follow-up.   No orders of the defined types were placed in this encounter.  Meds ordered this encounter  Medications   cariprazine  (VRAYLAR ) 1.5 MG capsule    Sig: Take 1 capsule (1.5 mg total) by mouth daily.    Dispense:  30  capsule    Refill:  5    Failed abilify    Referral Orders  No referral(s) requested today     Note is dictated utilizing voice recognition software. Although note has been proof read prior to signing, occasional typographical errors still can be missed. If any questions arise, please do not hesitate to call for verification.   electronically signed by:  Charlies Bellini, DO  [facility] - OR

## 2024-01-14 NOTE — Patient Instructions (Addendum)
 Return in about 5 weeks (around 02/18/2024) for Routine chronic condition follow-up.        Great to see you today.  I have refilled the medication(s) we provide.   If labs were collected or images ordered, we will inform you of  results once we have received them and reviewed. We will contact you either by echart message, or telephone call.  Please give ample time to the testing facility, and our office to run,  receive and review results. Please do not call inquiring of results, even if you can see them in your chart. We will contact you as soon as we are able. If it has been over 1 week since the test was completed, and you have not yet heard from us , then please call us .    - echart message- for normal results that have been seen by the patient already.   - telephone call: abnormal results or if patient has not viewed results in their echart.  If a referral to a specialist was entered for you, please call us  in 2 weeks if you have not heard from the specialist office to schedule.

## 2024-01-15 ENCOUNTER — Other Ambulatory Visit (HOSPITAL_COMMUNITY): Payer: Self-pay

## 2024-01-15 ENCOUNTER — Telehealth: Payer: Self-pay

## 2024-01-15 NOTE — Telephone Encounter (Signed)
 Please call patient I refilled the calcitonin for her.  Pharmacy called twice today asking for a 90-day refill.  I did go ahead and refill for 90 days with no refills.  This is not a long-term medicine, unless her neurosurgery team decides they want her to be on it longer, in which they would need to refill the medication.

## 2024-01-15 NOTE — Telephone Encounter (Signed)
 Pharmacy Patient Advocate Encounter   Received notification from Onbase that prior authorization for Vraylar  1.5MG  capsules  is required/requested.   Insurance verification completed.   The patient is insured through [employer] [facility].   Per test claim: PA required; PA submitted to above mentioned insurance via Latent Key/confirmation #/[id number] Status is pending

## 2024-01-17 ENCOUNTER — Ambulatory Visit: Admitting: Family Medicine

## 2024-01-17 ENCOUNTER — Encounter: Payer: Self-pay | Admitting: Family Medicine

## 2024-01-17 VITALS — BP 108/76 | Temp 97.6°F | Wt 164.6 lb

## 2024-01-17 DIAGNOSIS — R051 Acute cough: Secondary | ICD-10-CM | POA: Diagnosis not present

## 2024-01-17 DIAGNOSIS — J988 Other specified respiratory disorders: Secondary | ICD-10-CM | POA: Diagnosis not present

## 2024-01-17 DIAGNOSIS — B9789 Other viral agents as the cause of diseases classified elsewhere: Secondary | ICD-10-CM

## 2024-01-17 LAB — POC INFLUENZA A&B (BINAX/QUICKVUE)
Influenza A, POC: NEGATIVE
Influenza B, POC: NEGATIVE

## 2024-01-17 LAB — POC COVID19 BINAXNOW: SARS Coronavirus 2 Ag: NEGATIVE

## 2024-01-17 LAB — POCT RAPID STREP A (OFFICE): Rapid Strep A Screen: NEGATIVE

## 2024-01-17 MED ORDER — DOXYCYCLINE HYCLATE 100 MG PO TABS: 100.0000 mg | ORAL_TABLET | Freq: Two times a day (BID) | ORAL | 0 refills | Status: DC

## 2024-01-17 NOTE — Progress Notes (Signed)
 Tiffany Horn , [birth year], 63 y.o., female MRN: [id number] Patient Care Team    Relationship Specialty Notifications Start End  Catherine Charlies LABOR, DO PCP - General Family Medicine  08/21/22   Shila Gustav GAILS, MD Consulting Physician Gastroenterology  05/29/16   Olegario Messier, MD Referring Physician Obstetrics and Gynecology  07/17/17   Brenna Adine LITTIE, DO Consulting Physician Pulmonary Disease  06/12/19   Cathlyn JAYSON Nikki Bobie FORBES, MD Consulting Physician Obstetrics and Gynecology  08/30/22   Rosemarie Eather RAMAN, MD Consulting Physician Neurology  07/11/23   Jeffrie Oneil JAYSON, MD Consulting Physician Cardiology  07/11/23     Chief Complaint  Patient presents with   Cough    Scratchy throat starting yesterday; congestion     Subjective: Tiffany Horn is a 63 y.o. Pt presents for an OV with complaints of cough and congestion of 2 days duration.  Associated symptoms include chills.  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 See ROS Pt has tried Mucinex DM to ease their symptoms.  Patient reports it gave her hallucinations.     12/17/2023    1:46 PM 09/19/2023    1:48 PM 11/03/2022    1:30 PM 10/13/2022    9:01 AM 09/05/2022    2:08 PM  Depression screen PHQ 2/9  Decreased Interest 2 0 1 2 2   Down, Depressed, Hopeless 2 1 1 3 3   PHQ - 2 Score 4 1 2 5 5   Altered sleeping 2  2 2 3   Tired, decreased energy 2  1 3 2   Change in appetite 1  0 3 3  Feeling bad or failure about yourself  0  0 2 3  Trouble concentrating 0  1 3 2   Moving slowly or fidgety/restless 0  0 2 3  Suicidal thoughts 0  0 0 1  PHQ-9 Score 9  6  20  22    Difficult doing work/chores Somewhat difficult  Somewhat difficult Very difficult Very difficult     Data saved with a previous flowsheet row definition      01/05/2022    8:02 AM 01/13/2022    9:05 AM 07/05/2022    9:33 AM 09/19/2023    1:48 PM 12/17/2023    1:46 PM  Fall Risk  Falls in the past year?   0 0 0  Was there an injury with Fall?    0  0  0   Fall Risk Category Calculator   0 0 0  (RETIRED) Patient Fall Risk Level Low fall risk  Low fall risk      Patient at Risk for Falls Due to   No Fall Risks No Fall Risks   Fall risk Follow up   Follow up appointment Falls evaluation completed Falls evaluation completed     Data saved with a previous flowsheet row definition    Allergies[1] Social History   Social History Narrative   She is originally from [place]. She has traveled to [facility], [place], [place] , [place], [place], [place], [place], [place], [place] ,& [place]. No international travel. She has dogs. No prior bird, mold, or recent hot tub exposure. She hasn't used her hot tub in 1.5 years. 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 Previously enjoyed gardening and playing tennis. Helps to care for her mother.   Past Medical History:  Diagnosis Date   Allergy     Anxiety    Arthritis  knees and spine, shoulder   Asthma    Bronchitis    hx - recurrent   Complication of anesthesia    waking up is not easy   Depression    Elevated IgE level 09/12/2017   09/12/2017 IgE 195   GERD (gastroesophageal reflux disease)    History of COVID-19 12/30/2020   Hx of irritable bowel syndrome    x2   Hyperlipidemia    diet controlled - no medication   Hypertension    not taking any meds at present - under control per patient   Hypokalemia    with [facility] admission (2.5)   Hypothyroidism (acquired)    Intractable headache 01/04/2022   Ischemic cerebrovascular accident (CVA) (HCC)    Loss of weight 01/10/2024   Lower abdominal pain 12/17/2023   Migraines    Muscle strain 12/17/2023   Osteoporosis    Pneumonia    4 episodes; hosp. admission 2014   PONV (postoperative nausea and vomiting)    TIA (transient ischemic attack) 12/17/2020   UC (ulcerative colitis) [facility])    DX'D 2021   Past Surgical History:  Procedure Laterality Date   ANEURYSM COILING     STENT   BREAST SURGERY     implants, then had them  removed   COLONOSCOPY     greater 10 yrs ago - ? [facility]-2017 LAST   DILATION AND CURETTAGE OF UTERUS     IR ANGIO INTRA EXTRACRAN SEL INTERNAL CAROTID BILAT MOD SED  11/19/2020   IR ANGIO VERTEBRAL SEL VERTEBRAL BILAT MOD SED  11/19/2020   IR ANGIO VERTEBRAL SEL VERTEBRAL UNI L MOD SED  02/25/2021   IR ANGIO VERTEBRAL SEL VERTEBRAL UNI L MOD SED  01/13/2022   IR ANGIO VERTEBRAL SEL VERTEBRAL UNI L MOD SED  12/22/2022   IR ANGIOGRAM FOLLOW UP STUDY  11/19/2020   IR ANGIOGRAM FOLLOW UP STUDY  11/19/2020   IR ANGIOGRAM FOLLOW UP STUDY  11/19/2020   IR ANGIOGRAM FOLLOW UP STUDY  11/19/2020   IR ANGIOGRAM FOLLOW UP STUDY  11/19/2020   IR ANGIOGRAM FOLLOW UP STUDY  11/19/2020   IR ANGIOGRAM FOLLOW UP STUDY  11/19/2020   IR ANGIOGRAM FOLLOW UP STUDY  11/19/2020   IR INTRA CRAN STENT  11/19/2020   IR TRANSCATH/EMBOLIZ  11/19/2020   LAPAROSCOPIC ABDOMINAL EXPLORATION  01/31/1992   endometriosis   ORIF HUMERUS FRACTURE Left 04/01/2013   DR VERNETTA - shoulder   ORIF HUMERUS FRACTURE Left 04/01/2013   Procedure: OPEN REDUCTION INTERNAL FIXATION (ORIF) LEFT PROXIMAL HUMERUS FRACTURE;  Surgeon: Lonni CINDERELLA Vernetta, MD;  Location: [facility] OR;  Service: Orthopedics;  Laterality: Left;   RADIOLOGY WITH ANESTHESIA N/A 11/19/2020   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;  Surgeon: Lanis Pupa, MD;  Location: [facility] OR;  Service: Radiology;  Laterality: N/A;   TONSILLECTOMY AND ADENOIDECTOMY     Family History  Problem Relation Age of Onset   Breast cancer Mother    Diabetes Mother    Heart disease Mother    Diabetes Father    Dementia Father    Colon cancer Father    Pancreatic cancer Maternal Grandfather    Stroke Paternal Grandmother    Diabetes Paternal Grandmother    Colon polyps Neg Hx    Esophageal cancer Neg Hx    Stomach cancer Neg Hx    Rectal cancer Neg Hx    Crohn's disease Neg Hx    Ulcerative colitis Neg Hx    Allergies as of  01/17/2024        Reactions   Abilify  [aripiprazole ] Nausea And Vomiting   Amoxil [amoxicillin] Diarrhea   Dextromethorphan Other (See Comments)   Hallucinations, insomnia   Fosamax  [alendronate ] Other (See Comments)   GI upset   Promethazine  Hcl Other (See Comments)   Made pt feel weird   Seroquel [quetiapine Fumarate] Other (See Comments)   Pt does not recall reaction        Medication List        Accurate as of January 17, 2024 10:28 AM. If you have any questions, ask your nurse or doctor.          amLODipine  10 MG tablet Commonly known as: NORVASC  Take 1 tablet (10 mg total) by mouth daily.   aspirin  EC 81 MG tablet Take 81 mg by mouth daily. Swallow whole.   atorvastatin  80 MG tablet Commonly known as: LIPITOR  Take 1 tablet (80 mg total) by mouth daily.   B-12 1000 MCG Subl Place 1,000 mcg under the tongue daily.   Breztri  Aerosphere 160-9-4.8 MCG/ACT Aero inhaler Generic drug: budesonide -glycopyrrolate -formoterol  Inhale 2 puffs into the lungs in the morning and at bedtime. What changed: additional instructions   buPROPion  150 MG 24 hr tablet Commonly known as: WELLBUTRIN  XL Take 1 tablet (150 mg total) by mouth daily.   calcitonin (salmon) 200 UNIT/ACT nasal spray Commonly known as: MIACALCIN/FORTICAL PLACE 1 SPRAY INTO ALTERNATE NOSTRILS DAILY.   CALCIUM  MAGNESIUM  PO Take by mouth.   cariprazine  1.5 MG capsule Commonly known as: VRAYLAR  Take 1 capsule (1.5 mg total) by mouth daily.   clonazePAM  0.5 MG tablet Commonly known as: KLONOPIN  Take 1 tablet (0.5 mg total) by mouth 2 (two) times daily as needed for anxiety.   desvenlafaxine  100 MG 24 hr tablet Commonly known as: Pristiq  Take 1 tablet (100 mg total) by mouth daily.   doxycycline  100 MG tablet Commonly known as: VIBRA -TABS Take 1 tablet (100 mg total) by mouth 2 (two) times daily. Started by: Charlies Bellini, DO   ezetimibe  10 MG tablet Commonly known as: Zetia  Take 1 tablet (10 mg total) by mouth  every evening.   folic acid  1 MG tablet Commonly known as: FOLVITE  Take 1 tablet (1 mg total) by mouth daily.   hydrOXYzine  10 MG tablet Commonly known as: ATARAX  Take 10-30 mg by mouth.   hyoscyamine  0.125 MG SL tablet Commonly known as: LEVSIN  SL Place 1 tablet (0.125 mg total) under the tongue every 4 (four) hours as needed.   levothyroxine  150 MCG tablet Commonly known as: Synthroid  1 tab PO daily x6 days a week, 1.5 tabs PO 1 day a week   multivitamin tablet Take 1 tablet by mouth daily.   nortriptyline 10 MG capsule Commonly known as: PAMELOR Take 10 mg by mouth at bedtime.   ondansetron  4 MG tablet Commonly known as: ZOFRAN  TAKE 1 TABLET BY MOUTH EVERY 8 HOURS AS NEEDED FOR NAUSEA AND VOMITING   pantoprazole  40 MG tablet Commonly known as: PROTONIX  Take 1 tablet (40 mg total) by mouth daily.   sucralfate  1 g tablet Commonly known as: CARAFATE  TAKE 1 TABLET (1 G TOTAL) BY MOUTH 4 TIMES A DAY WITH MEALS AND AT BEDTIME   Vitamin D  (Ergocalciferol ) 1.25 MG (50000 UNIT) Caps capsule Commonly known as: DRISDOL  TAKE 1 CAPSULE (50,000 UNITS TOTAL) BY MOUTH EVERY 14 DAYS   VITAMIN D -3 PO Take 1 capsule by mouth daily.        All past medical history, surgical  history, allergies, family history, immunizations andmedications were updated in the EMR today and reviewed under the history and medication portions of their EMR.     Review of Systems  Constitutional:  Positive for chills and malaise/fatigue. Negative for fever.  HENT:  Positive for congestion and sore throat.   Eyes: Negative.   Respiratory:  Positive for cough, sputum production and shortness of breath. Negative for wheezing.   Cardiovascular: Negative.   Gastrointestinal: Negative.   Genitourinary: Negative.   Musculoskeletal:  Positive for myalgias.  Skin:  Negative for rash.  Neurological:  Negative for dizziness and headaches.   Negative, with the exception of above mentioned in  HPI   Objective:  BP 108/76   Temp 97.6 F (36.4 C)   Wt 164 lb 9.6 oz (74.7 kg)   LMP 01/31/2007   BMI 26.57 kg/m  Body mass index is 26.57 kg/m. Physical Exam Vitals and nursing note reviewed.  Constitutional:      General: She is not in acute distress.    Appearance: Normal appearance. She is not ill-appearing, toxic-appearing or diaphoretic.  HENT:     Head: Normocephalic and atraumatic.     Right Ear: Ear canal and external ear normal. A middle ear effusion is present. Tympanic membrane is not erythematous, retracted or bulging.     Left Ear: Ear canal and external ear normal. A middle ear effusion is present. Tympanic membrane is not erythematous, retracted or bulging.     Nose: Congestion and rhinorrhea present.     Right Turbinates: Swollen.     Left Turbinates: Swollen.     Right Sinus: No maxillary sinus tenderness or frontal sinus tenderness.     Left Sinus: No maxillary sinus tenderness or frontal sinus tenderness.     Mouth/Throat:     Lips: No lesions.     Mouth: Mucous membranes are moist. No oral lesions.     Tongue: No lesions.     Pharynx: Posterior oropharyngeal erythema and postnasal drip present. No pharyngeal swelling, oropharyngeal exudate or uvula swelling.  Eyes:     General: No scleral icterus.       Right eye: No discharge.        Left eye: No discharge.     Extraocular Movements: Extraocular movements intact.     Conjunctiva/sclera: Conjunctivae normal.     Pupils: Pupils are equal, round, and reactive to light.  Cardiovascular:     Rate and Rhythm: Normal rate and regular rhythm.  Pulmonary:     Effort: Pulmonary effort is normal. No respiratory distress.     Breath sounds: Normal breath sounds. No wheezing, rhonchi or rales.  Musculoskeletal:     Cervical back: Neck supple. Tenderness present.     Right lower leg: No edema.     Left lower leg: No edema.  Lymphadenopathy:     Cervical: Cervical adenopathy present.  Skin:    General: Skin  is warm.     Findings: No rash.  Neurological:     Mental Status: She is alert and oriented to person, place, and time. Mental status is at baseline.     Motor: No weakness.     Gait: Gait normal.  Psychiatric:        Mood and Affect: Mood normal.        Behavior: Behavior normal.        Thought Content: Thought content normal.        Judgment: Judgment normal.     No results found.  No results found. Results for orders placed or performed in visit on 01/17/24 (from the past 24 hours)  POC Influenza A&B (Binax test)     Status: None   Collection Time: 01/17/24  9:32 AM  Result Value Ref Range   Influenza A, POC Negative Negative   Influenza B, POC Negative Negative  POC COVID-19 BinaxNow     Status: None   Collection Time: 01/17/24  9:33 AM  Result Value Ref Range   SARS Coronavirus 2 Ag Negative Negative  POCT rapid strep A     Status: None   Collection Time: 01/17/24  9:33 AM  Result Value Ref Range   Rapid Strep A Screen Negative Negative    Assessment/Plan: Tiffany Horn is a 63 y.o. female present for OV for  Cough/viral respiratory illness Rest, hydrate.  Nasal saline flushes daily and mucinex (plain), nettie pot or nasal saline.  Discussed steroid use-patient does not tolerate steroid well. Doxycycline  twice daily prescription printed for her she understands to start if symptoms resolved by Monday or if symptoms are worsening. Follow-up in 2 symptoms are not improving, or worsening if needing to start antibiotic  Reviewed expectations re: course of current medical issues. Discussed self-management of symptoms. Outlined signs and symptoms indicating need for more acute intervention. Patient verbalized understanding and all questions were answered. Patient received an After-Visit Summary.    Orders Placed This Encounter  Procedures   POC Influenza A&B (Binax test)   POC COVID-19 BinaxNow   POCT rapid strep A   Meds ordered this encounter  Medications    doxycycline  (VIBRA -TABS) 100 MG tablet    Sig: Take 1 tablet (100 mg total) by mouth 2 (two) times daily.    Dispense:  20 tablet    Refill:  0   Referral Orders  No referral(s) requested today     Note is dictated utilizing voice recognition software. Although note has been proof read prior to signing, occasional typographical errors still can be missed. If any questions arise, please do not hesitate to call for verification.   electronically signed by:  Charlies Bellini, DO  [facility] - [place]       [1]  Allergies Allergen Reactions   Abilify  [Aripiprazole ] Nausea And Vomiting   Amoxil [Amoxicillin] Diarrhea   Dextromethorphan Other (See Comments)    Hallucinations, insomnia   Fosamax  [Alendronate ] Other (See Comments)    GI upset   Promethazine  Hcl Other (See Comments)    Made pt feel weird   Seroquel [Quetiapine Fumarate] Other (See Comments)    Pt does not recall reaction

## 2024-01-17 NOTE — Telephone Encounter (Signed)
 Pharmacy Patient Advocate Encounter  Received notification from [facility] that Prior Authorization for Vraylar  1.5MG  capsules  has been APPROVED from 01/16/2024 to 01/16/2027   PA #/Case ID/Reference #: [id number]

## 2024-01-17 NOTE — Patient Instructions (Addendum)
 Return in about 2 weeks (around 01/31/2024), or if symptoms worsen or fail to improve.   Mucinex (plain)can help.  Nasal saline flushes at least twice a day.       Great to see you today.  I have refilled the medication(s) we provide.   If labs were collected or images ordered, we will inform you of  results once we have received them and reviewed. We will contact you either by echart message, or telephone call.  Please give ample time to the testing facility, and our office to run,  receive and review results. Please do not call inquiring of results, even if you can see them in your chart. We will contact you as soon as we are able. If it has been over 1 week since the test was completed, and you have not yet heard from us , then please call us .    - echart message- for normal results that have been seen by the patient already.   - telephone call: abnormal results or if patient has not viewed results in their echart.  If a referral to a specialist was entered for you, please call us  in 2 weeks if you have not heard from the specialist office to schedule.

## 2024-01-19 ENCOUNTER — Encounter: Payer: Self-pay | Admitting: Gastroenterology

## 2024-01-21 ENCOUNTER — Other Ambulatory Visit: Payer: Self-pay | Admitting: *Deleted

## 2024-01-21 ENCOUNTER — Other Ambulatory Visit: Payer: Self-pay | Admitting: Family Medicine

## 2024-01-21 ENCOUNTER — Other Ambulatory Visit: Payer: Self-pay

## 2024-01-21 ENCOUNTER — Ambulatory Visit: Admitting: Family Medicine

## 2024-01-21 VITALS — BP 126/84 | Ht 66.0 in | Wt 160.0 lb

## 2024-01-21 DIAGNOSIS — M8000XA Age-related osteoporosis with current pathological fracture, unspecified site, initial encounter for fracture: Secondary | ICD-10-CM | POA: Diagnosis not present

## 2024-01-21 MED ORDER — TYMLOS 3120 MCG/1.56ML ~~LOC~~ SOPN
80.0000 ug | PEN_INJECTOR | Freq: Every day | SUBCUTANEOUS | 12 refills | Status: DC
  Filled 2024-01-21: qty 1.56, fill #0

## 2024-01-21 MED ORDER — TYMLOS 3120 MCG/1.56ML ~~LOC~~ SOPN: 80.0000 ug | PEN_INJECTOR | Freq: Every day | SUBCUTANEOUS | 12 refills | Status: AC

## 2024-01-21 NOTE — Telephone Encounter (Unsigned)
 Copied from CRM #[id number]. Topic: Clinical - Medication Refill >> Jan 21, 2024 12:02 PM Brittany M wrote: Medication: Vitamin D , Ergocalciferol , (DRISDOL ) 1.25 MG (50000 UNIT) CAPS capsule  Has the patient contacted their pharmacy? Yes (Agent: If no, request that the patient contact the pharmacy for the refill. If patient does not wish to contact the pharmacy document the reason why and proceed with request.) (Agent: If yes, when and what did the pharmacy advise?)  This is the patient's preferred pharmacy:  [id number] [place], [place] - [address] [place] [address] [place] [address] [place]  [address] Phone: [phone] Fax: [phone]   Is this the correct pharmacy for this prescription? Yes If no, delete pharmacy and type the correct one.   Has the prescription been filled recently? Yes  Is the patient out of the medication? Yes  Has the patient been seen for an appointment in the last year OR does the patient have an upcoming appointment? Yes  Can we respond through MyChart? Yes  Agent: Please be advised that Rx refills may take up to 3 business days. We ask that you follow-up with your pharmacy.

## 2024-01-21 NOTE — Progress Notes (Addendum)
 PCP: Catherine Charlies LABOR, DO  Patient is a 63 y.o. female here for osteoporosis follow up.  Patient presents with referral from Neurosurgery after evidence of compression fractures noted. Has significant mid-back pain, struggles to stand up due to pain.  Deneis LE numbness, tingling, weakness.  Denies loss of bowel/bladder control. Does have hypothyroidism on 150 mcg levothyroxine  daily.  Notably, the patient is currently undergoing a GI workup for >10 pounds of unintentional weight loss in the past few months, severe abdominal pain, and worsening reflux.  Patient reports there is some concern for malignancy, though unclear at this time.  She will be getting an endoscopy and colonoscopy in the near future.  She would like to complete this workup prior to initiating osteoporosis treatment.  Prior treatment: Fosamax , stopped d/t nausea and reflux History of Hip, Spine, or Wrist Fracture: T7-T11 compression fractures on CT 11/17 Heart disease or stroke: 2 CVAs in 2022, none in last year Cancer: No Kidney Disease: No Gastric/Peptic Ulcer: No Gastric bypass surgery: No Severe GERD: Yes, taking pantoprazole  40 mg daily History of seizures: No Age at Menopause: 47 Hysterectomy: No Calcium  intake: No separate calcium  Vitamin D  intake: Took vitamin D  50,000 IU for several weeks, now not sure what she is taking Hormone replacement therapy: No Smoking history: Never Alcohol: 0-2 beverages per week Exercise: Walk Major dental work in past year: No Parents with hip/spine fracture: No   Past Medical History:  Diagnosis Date   Allergy     Anxiety    Arthritis    knees and spine, shoulder   Asthma    Bronchitis    hx - recurrent   Complication of anesthesia    waking up is not easy   Depression    Elevated IgE level 09/12/2017   09/12/2017 IgE 195   GERD (gastroesophageal reflux disease)    History of COVID-19 12/30/2020   Hx of irritable bowel syndrome    x2   Hyperlipidemia    diet  controlled - no medication   Hypertension    not taking any meds at present - under control per patient   Hypokalemia    with [facility] admission (2.5)   Hypothyroidism (acquired)    Intractable headache 01/04/2022   Ischemic cerebrovascular accident (CVA) (HCC)    Loss of weight 01/10/2024   Lower abdominal pain 12/17/2023   Migraines    Muscle strain 12/17/2023   Osteoporosis    Pneumonia    4 episodes; hosp. admission 2014   PONV (postoperative nausea and vomiting)    TIA (transient ischemic attack) 12/17/2020   UC (ulcerative colitis) [facility])    DX'D 2021    Medications Ordered Prior to Encounter[1]  Past Surgical History:  Procedure Laterality Date   ANEURYSM COILING     STENT   BREAST SURGERY     implants, then had them removed   COLONOSCOPY     greater 10 yrs ago - ? [facility]-2017 LAST   DILATION AND CURETTAGE OF UTERUS     IR ANGIO INTRA EXTRACRAN SEL INTERNAL CAROTID BILAT MOD SED  11/19/2020   IR ANGIO VERTEBRAL SEL VERTEBRAL BILAT MOD SED  11/19/2020   IR ANGIO VERTEBRAL SEL VERTEBRAL UNI L MOD SED  02/25/2021   IR ANGIO VERTEBRAL SEL VERTEBRAL UNI L MOD SED  01/13/2022   IR ANGIO VERTEBRAL SEL VERTEBRAL UNI L MOD SED  12/22/2022   IR ANGIOGRAM FOLLOW UP STUDY  11/19/2020   IR ANGIOGRAM FOLLOW UP STUDY  11/19/2020  IR ANGIOGRAM FOLLOW UP STUDY  11/19/2020   IR ANGIOGRAM FOLLOW UP STUDY  11/19/2020   IR ANGIOGRAM FOLLOW UP STUDY  11/19/2020   IR ANGIOGRAM FOLLOW UP STUDY  11/19/2020   IR ANGIOGRAM FOLLOW UP STUDY  11/19/2020   IR ANGIOGRAM FOLLOW UP STUDY  11/19/2020   IR INTRA CRAN STENT  11/19/2020   IR TRANSCATH/EMBOLIZ  11/19/2020   LAPAROSCOPIC ABDOMINAL EXPLORATION  01/31/1992   endometriosis   ORIF HUMERUS FRACTURE Left 04/01/2013   DR VERNETTA - shoulder   ORIF HUMERUS FRACTURE Left 04/01/2013   Procedure: OPEN REDUCTION INTERNAL FIXATION (ORIF) LEFT PROXIMAL HUMERUS FRACTURE;  Surgeon: Lonni CINDERELLA Vernetta, MD;  Location: [facility] OR;  Service:  Orthopedics;  Laterality: Left;   RADIOLOGY WITH ANESTHESIA N/A 11/19/2020   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 MSK: Significant thoracic spine TTP, discomfort with sitting and standing. No foot drop or gait abnormality.  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  Will pursue osteoporosis treatment in the new year following GI workup. - Vitamin D  25-OH lab - Calcium  1200 mg, vitamin D  800 IU daily recommended - Follow-up after GI workup to further discuss starting Tymlos  versus Prolia  Compression fractures T7-T11, back pain - Calcitonin intranasal spray daily alternating nostrils - Acetaminophen  PRN - Follow-up with PCP for further pain medication management, could consider tramadol, adjunctive medications like gabapentin , cymbalta (to not conflict with her current medications however).   - We discussed kyphoplasty/vertebroplasty and recommended against these unless pain was severe and not responsive to medication treatment - she notes she has improved over past several weeks.     [1]  Current Outpatient Medications on File Prior to Visit  Medication Sig Dispense Refill   amLODipine  (NORVASC ) 10 MG tablet Take  1 tablet (10 mg total) by mouth daily. 90 tablet 1   aspirin  EC 81 MG tablet Take 81 mg by mouth daily. Swallow whole.     atorvastatin  (LIPITOR ) 80 MG tablet Take 1 tablet (80 mg total) by mouth daily. 90 tablet 1   Budeson-Glycopyrrol-Formoterol  (BREZTRI  AEROSPHERE) 160-9-4.8 MCG/ACT AERO Inhale 2 puffs into the lungs in the morning and at bedtime. (Patient taking differently: Inhale 2 puffs into the lungs in the morning and at bedtime. prn) 5.9 g 1   buPROPion  (WELLBUTRIN  XL) 150 MG 24 hr tablet Take 1 tablet (150 mg total) by mouth daily. 90 tablet 1   calcitonin, salmon, (MIACALCIN/FORTICAL) 200 UNIT/ACT nasal spray PLACE 1 SPRAY INTO ALTERNATE NOSTRILS DAILY. 11.1 mL 0  Calcium -Magnesium -Vitamin D  (CALCIUM  MAGNESIUM  PO) Take by mouth.     cariprazine  (VRAYLAR ) 1.5 MG capsule Take 1 capsule (1.5 mg total) by mouth daily. 30 capsule 5   Cholecalciferol  (VITAMIN D -3 PO) Take 1 capsule by mouth daily.     clonazePAM  (KLONOPIN ) 0.5 MG tablet Take 1 tablet (0.5 mg total) by mouth 2 (two) times daily as needed for anxiety. 180 tablet 1   Cyanocobalamin  (B-12) 1000 MCG SUBL Place 1,000 mcg under the tongue daily. 90 tablet 3   desvenlafaxine  (PRISTIQ ) 100 MG 24 hr tablet Take 1 tablet (100 mg total) by mouth daily. 90 tablet 1   doxycycline  (VIBRA -TABS) 100 MG tablet Take 1 tablet (100 mg total) by mouth 2 (two) times daily. 20 tablet 0   ezetimibe  (ZETIA ) 10 MG tablet Take 1 tablet (10 mg total) by mouth every evening. 90 tablet 3   folic acid  (FOLVITE ) 1 MG tablet Take 1 tablet (1 mg total) by mouth daily. 90 tablet 1   hydrOXYzine  (ATARAX ) 10 MG tablet Take 10-30 mg by mouth.     hyoscyamine  (LEVSIN  SL) 0.125 MG SL tablet Place 1 tablet (0.125 mg total) under the tongue every 4 (four) hours as needed. 30 tablet 0   levothyroxine  (SYNTHROID ) 150 MCG tablet 1 tab PO daily x6 days a week, 1.5 tabs PO 1 day a week 90 tablet 1   Multiple Vitamin (MULTIVITAMIN) tablet Take 1 tablet by mouth daily.      nortriptyline (PAMELOR) 10 MG capsule Take 10 mg by mouth at bedtime.     ondansetron  (ZOFRAN ) 4 MG tablet TAKE 1 TABLET BY MOUTH EVERY 8 HOURS AS NEEDED FOR NAUSEA AND VOMITING 18 tablet 1   pantoprazole  (PROTONIX ) 40 MG tablet Take 1 tablet (40 mg total) by mouth daily. 90 tablet 3   sucralfate  (CARAFATE ) 1 g tablet TAKE 1 TABLET (1 G TOTAL) BY MOUTH 4 TIMES A DAY WITH MEALS AND AT BEDTIME 120 tablet 0   Vitamin D , Ergocalciferol , (DRISDOL ) 1.25 MG (50000 UNIT) CAPS capsule TAKE 1 CAPSULE (50,000 UNITS TOTAL) BY MOUTH EVERY 14 DAYS 6 capsule 1   No current facility-administered medications on file prior to visit.  [2]  Allergies Allergen Reactions   Abilify  [Aripiprazole ] Nausea And Vomiting   Amoxil [Amoxicillin] Diarrhea   Dextromethorphan Other (See Comments)    Hallucinations, insomnia   Fosamax  [Alendronate ] Other (See Comments)    GI upset   Nsaids Other (See Comments)    Unable to take NSAIDs due to aneurysm coiling and stent in 2022   Promethazine  Hcl Other (See Comments)    Made pt feel weird   Seroquel [Quetiapine Fumarate] Other (See Comments)    Pt does not recall reaction

## 2024-01-21 NOTE — Patient Instructions (Signed)
 Get your vitamin D  levels checked - we will contact you on if we need to make any adjustments after this. In meantime calcium  1200mg  daily, vitamin D  800 international units daily. I hope everything goes well with the additional testing. We can start to look into tymlos  for you after the new year to see what the cost would be - this wouldn't commit you to taking it. Prolia would be the other medication if the tymlos  is contraindicated. Use the calcitonin nasal spray daily (1 spray daily, alternate nostril each day). Follow up with Dr. Kuneff about additional treatment to ensure there isn't interaction with other medication and medical conditions - I'll put some options in your note outside the tylenol  and calcitonin.

## 2024-01-22 NOTE — Telephone Encounter (Signed)
 Please fill, if appropriate. Labs done 12/22

## 2024-01-28 NOTE — Telephone Encounter (Signed)
 Inbound call from patient confirm the procedure appointment for January the 12 th at 8:00 AM. Please advise.

## 2024-01-29 ENCOUNTER — Encounter: Payer: Self-pay | Admitting: Family Medicine

## 2024-01-29 ENCOUNTER — Other Ambulatory Visit: Payer: Self-pay

## 2024-01-29 DIAGNOSIS — R634 Abnormal weight loss: Secondary | ICD-10-CM

## 2024-01-29 DIAGNOSIS — R197 Diarrhea, unspecified: Secondary | ICD-10-CM

## 2024-01-29 DIAGNOSIS — R1030 Lower abdominal pain, unspecified: Secondary | ICD-10-CM

## 2024-01-30 NOTE — Telephone Encounter (Signed)
"  No further action needed at this time.  "

## 2024-01-30 NOTE — Telephone Encounter (Signed)
 If symptoms are worsening and she needs urgent imaging or labs, I would recommend she be seen at [facility] urgent care which has the capability to do both if appropriate after exam.  Of course there are other local emergency rooms if she feels this is truly an emergency situation, however emergency rooms  are overcrowded with influenza patients currently.

## 2024-02-04 ENCOUNTER — Encounter: Payer: Self-pay | Admitting: Gastroenterology

## 2024-02-06 ENCOUNTER — Ambulatory Visit: Admitting: Gastroenterology

## 2024-02-11 ENCOUNTER — Encounter: Payer: Self-pay | Admitting: Gastroenterology

## 2024-02-11 ENCOUNTER — Ambulatory Visit: Admitting: Gastroenterology

## 2024-02-11 VITALS — BP 122/84 | HR 71 | Temp 97.8°F | Resp 10 | Ht 66.0 in | Wt 167.0 lb

## 2024-02-11 DIAGNOSIS — K648 Other hemorrhoids: Secondary | ICD-10-CM | POA: Diagnosis not present

## 2024-02-11 DIAGNOSIS — K644 Residual hemorrhoidal skin tags: Secondary | ICD-10-CM

## 2024-02-11 DIAGNOSIS — R197 Diarrhea, unspecified: Secondary | ICD-10-CM | POA: Diagnosis present

## 2024-02-11 DIAGNOSIS — R634 Abnormal weight loss: Secondary | ICD-10-CM | POA: Diagnosis not present

## 2024-02-11 DIAGNOSIS — K529 Noninfective gastroenteritis and colitis, unspecified: Secondary | ICD-10-CM

## 2024-02-11 DIAGNOSIS — R1030 Lower abdominal pain, unspecified: Secondary | ICD-10-CM

## 2024-02-11 MED ORDER — SODIUM CHLORIDE 0.9 % IV SOLN: 500.0000 mL | Freq: Once | INTRAVENOUS | Status: DC

## 2024-02-11 NOTE — Progress Notes (Signed)
 Report given to PACU, vss

## 2024-02-11 NOTE — Patient Instructions (Addendum)
 Resume previous diet Continue present medications Await pathology results Return to GI office in 4-6 weeks, appointment on 03/17/24 at 850 AM  Handouts/information given for hemorrhoids  YOU HAD AN ENDOSCOPIC PROCEDURE TODAY AT THE [facility]:   Refer to the procedure report that was given to you for any specific questions about what was found during the examination.  If the procedure report does not answer your questions, please call your gastroenterologist to clarify.  If you requested that your care partner not be given the details of your procedure findings, then the procedure report has been included in a sealed envelope for you to review at your convenience later.  YOU SHOULD EXPECT: Some feelings of bloating in the abdomen. Passage of more gas than usual.  Walking can help get rid of the air that was put into your GI tract during the procedure and reduce the bloating. If you had a lower endoscopy (such as a colonoscopy or flexible sigmoidoscopy) you may notice spotting of blood in your stool or on the toilet paper. If you underwent a bowel prep for your procedure, you may not have a normal bowel movement for a few days.  Please Note:  You might notice some irritation and congestion in your nose or some drainage.  This is from the oxygen used during your procedure.  There is no need for concern and it should clear up in a day or so.  SYMPTOMS TO REPORT IMMEDIATELY:  Following lower endoscopy (colonoscopy):  Excessive amounts of blood in the stool  Significant tenderness or worsening of abdominal pains  Swelling of the abdomen that is new, acute  Fever of 100F or higher For urgent or emergent issues, a gastroenterologist can be reached at any hour by calling [phone]. Do not use MyChart messaging for urgent concerns.   DIET:  We do recommend a small meal at first, but then you may proceed to your regular diet.  Drink plenty of fluids but you should avoid alcoholic  beverages for 24 hours.  ACTIVITY:  You should plan to take it easy for the rest of today and you should NOT DRIVE or use heavy machinery until tomorrow (because of the sedation medicines used during the test).    FOLLOW UP: Our staff will call the number listed on your records the next business day following your procedure.  We will call around 7:15- 8:00 am to check on you and address any questions or concerns that you may have regarding the information given to you following your procedure. If we do not reach you, we will leave a message.     If any biopsies were taken you will be contacted by phone or by letter within the next 1-3 weeks.  Please call us  at [phone] if you have not heard about the biopsies in 3 weeks.   SIGNATURES/CONFIDENTIALITY: You and/or your care partner have signed paperwork which will be entered into your electronic medical record.  These signatures attest to the fact that that the information above on your After Visit Summary has been reviewed and is understood.  Full responsibility of the confidentiality of this discharge information lies with you and/or your care-partner.

## 2024-02-11 NOTE — Progress Notes (Signed)
 [facility] History and Physical   Primary Care Physician:  Catherine Charlies LABOR, DO   Reason for Procedure:  Lower abdominal pain, weight loss unintentional, chronic diarrhea  Plan:     colonoscopy with possible interventions as needed     HPI: Tiffany Horn is a very pleasant 64 y.o. female here for colonoscopy for lower abdominal pain, weight loss unintentional, chronic diarrhea. Please refer to office visit note by Jessica Zehr for details  The risks and benefits as well as alternatives of endoscopic procedure(s) have been discussed and reviewed.  The patient was provided an opportunity to ask questions and all were answered. The patient agreed with the plan and demonstrated an understanding of the instructions.   Past Medical History:  Diagnosis Date   Allergy     Anxiety    Arthritis    knees and spine, shoulder   Asthma    Bronchitis    hx - recurrent   Complication of anesthesia    waking up is not easy   Depression    Elevated IgE level 09/12/2017   09/12/2017 IgE 195   GERD (gastroesophageal reflux disease)    History of COVID-19 12/30/2020   Hx of irritable bowel syndrome    x2   Hyperlipidemia    diet controlled - no medication   Hypertension    not taking any meds at present - under control per patient   Hypokalemia    with PNA admission (2.5)   Hypothyroidism (acquired)    Intractable headache 01/04/2022   Ischemic cerebrovascular accident (CVA) (HCC)    Loss of weight 01/10/2024   Lower abdominal pain 12/17/2023   Migraines    Muscle strain 12/17/2023   Osteoporosis    Pneumonia    4 episodes; hosp. admission 2014   PONV (postoperative nausea and vomiting)    TIA (transient ischemic attack) 12/17/2020   UC (ulcerative colitis) [facility])    DX'D 2021    Past Surgical History:  Procedure Laterality Date   ANEURYSM COILING     STENT   BREAST SURGERY     implants, then had them removed   COLONOSCOPY     greater 10 yrs ago - ? [facility]-2017 LAST   DILATION AND CURETTAGE OF UTERUS     IR ANGIO INTRA EXTRACRAN SEL INTERNAL CAROTID BILAT MOD SED  11/19/2020   IR ANGIO VERTEBRAL SEL VERTEBRAL BILAT MOD SED  11/19/2020   IR ANGIO VERTEBRAL SEL VERTEBRAL UNI L MOD SED  02/25/2021   IR ANGIO VERTEBRAL SEL VERTEBRAL UNI L MOD SED  01/13/2022   IR ANGIO VERTEBRAL SEL VERTEBRAL UNI L MOD SED  12/22/2022   IR ANGIOGRAM FOLLOW UP STUDY  11/19/2020   IR ANGIOGRAM FOLLOW UP STUDY  11/19/2020   IR ANGIOGRAM FOLLOW UP STUDY  11/19/2020   IR ANGIOGRAM FOLLOW UP STUDY  11/19/2020   IR ANGIOGRAM FOLLOW UP STUDY  11/19/2020   IR ANGIOGRAM FOLLOW UP STUDY  11/19/2020   IR ANGIOGRAM FOLLOW UP STUDY  11/19/2020   IR ANGIOGRAM FOLLOW UP STUDY  11/19/2020   IR INTRA CRAN STENT  11/19/2020   IR TRANSCATH/EMBOLIZ  11/19/2020   LAPAROSCOPIC ABDOMINAL EXPLORATION  01/31/1992   endometriosis   ORIF HUMERUS FRACTURE Left 04/01/2013   DR VERNETTA - shoulder   ORIF HUMERUS FRACTURE Left 04/01/2013   Procedure: OPEN REDUCTION INTERNAL FIXATION (ORIF) LEFT PROXIMAL HUMERUS FRACTURE;  Surgeon: Lonni CINDERELLA Vernetta, MD;  Location: [facility] OR;  Service: Orthopedics;  Laterality: Left;  RADIOLOGY WITH ANESTHESIA N/A 11/19/2020   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;  Surgeon: Lanis Pupa, MD;  Location: [facility] OR;  Service: Radiology;  Laterality: N/A;   TONSILLECTOMY AND ADENOIDECTOMY      Prior to Admission medications  Medication Sig Start Date End Date Taking? Authorizing Provider  amLODipine  (NORVASC ) 10 MG tablet Take 1 tablet (10 mg total) by mouth daily. 10/18/23  Yes Kuneff, Renee A, DO  aspirin  EC 81 MG tablet Take 81 mg by mouth daily. Swallow whole.   Yes [provider]  buPROPion  (WELLBUTRIN  XL) 150 MG 24 hr tablet Take 1 tablet (150 mg total) by mouth daily. 12/19/23  Yes Kuneff, Renee A, DO  calcitonin, salmon, (MIACALCIN/FORTICAL) 200 UNIT/ACT nasal spray PLACE 1 SPRAY INTO ALTERNATE NOSTRILS DAILY.  01/15/24  Yes Kuneff, Renee A, DO  cariprazine  (VRAYLAR ) 1.5 MG capsule Take 1 capsule (1.5 mg total) by mouth daily. 01/14/24  Yes Kuneff, Renee A, DO  Cyanocobalamin  (VITAMIN B-12 IJ) Inject as directed once a week.   Yes [provider]  desvenlafaxine  (PRISTIQ ) 100 MG 24 hr tablet Take 1 tablet (100 mg total) by mouth daily. 12/19/23  Yes Kuneff, Renee A, DO  hyoscyamine  (LEVSIN  SL) 0.125 MG SL tablet Place 1 tablet (0.125 mg total) under the tongue every 4 (four) hours as needed. 01/10/24  Yes Zehr, Jessica D, PA-C  levothyroxine  (SYNTHROID ) 150 MCG tablet 1 tab PO daily x6 days a week, 1.5 tabs PO 1 day a week 10/18/23  Yes Kuneff, Renee A, DO  Multiple Vitamin (MULTIVITAMIN) tablet Take 1 tablet by mouth daily.   Yes [provider]  nortriptyline (PAMELOR) 10 MG capsule Take 10 mg by mouth at bedtime. 01/01/24  Yes [provider]  ondansetron  (ZOFRAN ) 4 MG tablet TAKE 1 TABLET BY MOUTH EVERY 8 HOURS AS NEEDED FOR NAUSEA AND VOMITING 01/03/24  Yes Kuneff, Renee A, DO  pantoprazole  (PROTONIX ) 40 MG tablet Take 1 tablet (40 mg total) by mouth daily. 07/21/22  Yes Cheyenne Schumm V, MD  sucralfate  (CARAFATE ) 1 g tablet TAKE 1 TABLET (1 G TOTAL) BY MOUTH 4 TIMES A DAY WITH MEALS AND AT BEDTIME 01/16/24  Yes Kuneff, Renee A, DO  Abaloparatide  (TYMLOS ) 3120 MCG/1.56ML SOPN Inject 80 mcg into the skin daily. Patient not taking: Reported on 02/11/2024 01/21/24   Cleatrice Ludie SAUNDERS, MD  atorvastatin  (LIPITOR ) 80 MG tablet Take 1 tablet (80 mg total) by mouth daily. 10/18/23   Kuneff, Renee A, DO  Budeson-Glycopyrrol-Formoterol  (BREZTRI  AEROSPHERE) 160-9-4.8 MCG/ACT AERO Inhale 2 puffs into the lungs in the morning and at bedtime. Patient taking differently: Inhale 2 puffs into the lungs in the morning and at bedtime. prn 04/28/20   Icard, Adine L, DO  Calcium -Magnesium -Vitamin D  (CALCIUM  MAGNESIUM  PO) Take by mouth. Patient not taking: Reported on 02/11/2024    [provider]  clonazePAM  (KLONOPIN ) 0.5 MG tablet Take 1 tablet (0.5 mg total) by mouth 2 (two) times daily as needed for anxiety. 12/19/23   Kuneff, Renee A, DO  ezetimibe  (ZETIA ) 10 MG tablet Take 1 tablet (10 mg total) by mouth every evening. 10/18/23   Kuneff, Renee A, DO  folic acid  (FOLVITE ) 1 MG tablet Take 1 tablet (1 mg total) by mouth daily. 10/18/23   Kuneff, Renee A, DO  hydrOXYzine  (ATARAX ) 10 MG tablet Take 10-30 mg by mouth. 01/01/24   [provider]  Vitamin D , Ergocalciferol , (DRISDOL ) 1.25 MG (50000 UNIT) CAPS capsule TAKE 1 CAPSULE (50,000 UNITS TOTAL)  BY MOUTH EVERY 14 DAYS 07/23/23   Catherine Fuller A, DO    Current Outpatient Medications  Medication Sig Dispense Refill   amLODipine  (NORVASC ) 10 MG tablet Take 1 tablet (10 mg total) by mouth daily. 90 tablet 1   aspirin  EC 81 MG tablet Take 81 mg by mouth daily. Swallow whole.     buPROPion  (WELLBUTRIN  XL) 150 MG 24 hr tablet Take 1 tablet (150 mg total) by mouth daily. 90 tablet 1   calcitonin, salmon, (MIACALCIN/FORTICAL) 200 UNIT/ACT nasal spray PLACE 1 SPRAY INTO ALTERNATE NOSTRILS DAILY. 11.1 mL 0   cariprazine  (VRAYLAR ) 1.5 MG capsule Take 1 capsule (1.5 mg total) by mouth daily. 30 capsule 5   Cyanocobalamin  (VITAMIN B-12 IJ) Inject as directed once a week.     desvenlafaxine  (PRISTIQ ) 100 MG 24 hr tablet Take 1 tablet (100 mg total) by mouth daily. 90 tablet 1   hyoscyamine  (LEVSIN  SL) 0.125 MG SL tablet Place 1 tablet (0.125 mg total) under the tongue every 4 (four) hours as needed. 30 tablet 0   levothyroxine  (SYNTHROID ) 150 MCG tablet 1 tab PO daily x6 days a week, 1.5 tabs PO 1 day a week 90 tablet 1   Multiple Vitamin (MULTIVITAMIN) tablet Take 1 tablet by mouth daily.     nortriptyline (PAMELOR) 10 MG capsule Take 10 mg by mouth at bedtime.     ondansetron  (ZOFRAN ) 4 MG tablet TAKE 1 TABLET BY MOUTH EVERY 8 HOURS AS NEEDED FOR NAUSEA AND VOMITING 18 tablet 1   pantoprazole  (PROTONIX ) 40 MG tablet Take 1  tablet (40 mg total) by mouth daily. 90 tablet 3   sucralfate  (CARAFATE ) 1 g tablet TAKE 1 TABLET (1 G TOTAL) BY MOUTH 4 TIMES A DAY WITH MEALS AND AT BEDTIME 120 tablet 0   Abaloparatide  (TYMLOS ) 3120 MCG/1.56ML SOPN Inject 80 mcg into the skin daily. (Patient not taking: Reported on 02/11/2024) 1.56 mL 12   atorvastatin  (LIPITOR ) 80 MG tablet Take 1 tablet (80 mg total) by mouth daily. 90 tablet 1   Budeson-Glycopyrrol-Formoterol  (BREZTRI  AEROSPHERE) 160-9-4.8 MCG/ACT AERO Inhale 2 puffs into the lungs in the morning and at bedtime. (Patient taking differently: Inhale 2 puffs into the lungs in the morning and at bedtime. prn) 5.9 g 1   Calcium -Magnesium -Vitamin D  (CALCIUM  MAGNESIUM  PO) Take by mouth. (Patient not taking: Reported on 02/11/2024)     clonazePAM  (KLONOPIN ) 0.5 MG tablet Take 1 tablet (0.5 mg total) by mouth 2 (two) times daily as needed for anxiety. 180 tablet 1   ezetimibe  (ZETIA ) 10 MG tablet Take 1 tablet (10 mg total) by mouth every evening. 90 tablet 3   folic acid  (FOLVITE ) 1 MG tablet Take 1 tablet (1 mg total) by mouth daily. 90 tablet 1   hydrOXYzine  (ATARAX ) 10 MG tablet Take 10-30 mg by mouth.     Vitamin D , Ergocalciferol , (DRISDOL ) 1.25 MG (50000 UNIT) CAPS capsule TAKE 1 CAPSULE (50,000 UNITS TOTAL) BY MOUTH EVERY 14 DAYS 6 capsule 1   Current Facility-Administered Medications  Medication Dose Route Frequency Provider Last Rate Last Admin   0.9 %  sodium chloride  infusion  500 mL Intravenous Once Lakie Mclouth V, MD        Allergies as of 02/11/2024 - Review Complete 02/11/2024  Allergen Reaction Noted   Abilify  [aripiprazole ] Nausea And Vomiting 09/03/2019   Amoxil [amoxicillin] Diarrhea 06/11/2015   Dextromethorphan Other (See Comments) 01/17/2024   Fosamax  [alendronate ] Other (See Comments) 01/14/2024   Nsaids Other (See Comments) 01/21/2024  Promethazine  hcl Other (See Comments) 12/29/2020   Seroquel [quetiapine fumarate] Other (See Comments) 09/29/2016     Family History  Problem Relation Age of Onset   Breast cancer Mother    Diabetes Mother    Heart disease Mother    Diabetes Father    Dementia Father    Colon cancer Father    Pancreatic cancer Maternal Grandfather    Stroke Paternal Grandmother    Diabetes Paternal Grandmother    Colon polyps Neg Hx    Esophageal cancer Neg Hx    Stomach cancer Neg Hx    Rectal cancer Neg Hx    Crohn's disease Neg Hx    Ulcerative colitis Neg Hx     Social History   Socioeconomic History   Marital status: Single    Spouse name: Not on file   Number of children: 0   Years of education: Not on file   Highest education level: Bachelor's degree (e.g., BA, AB, BS)  Occupational History   Occupation: geologist, engineering  Tobacco Use   Smoking status: Never    Passive exposure: Never   Smokeless tobacco: Never  Vaping Use   Vaping status: Never Used  Substance and Sexual Activity   Alcohol use: Yes    Alcohol/week: 1.0 standard drink of alcohol    Types: 1 Glasses of wine per week    Comment: SOCIALLY   Drug use: No   Sexual activity: Yes    Partners: Male    Birth control/protection: Post-menopausal  Other Topics Concern   Not on file  Social History Narrative   She is originally from [place]. She has traveled to [facility], [place], [place] , [place], [place], [place], [place], [place], [place] ,& [place]. No international travel. She has dogs. No prior bird, mold, or recent hot tub exposure. She hasn't used her hot tub in 1.5 years. 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 Previously enjoyed gardening and playing tennis. Helps to care for her mother.   Social Drivers of Health   Tobacco Use: Low Risk (02/11/2024)   Patient History    Smoking Tobacco Use: Never    Smokeless Tobacco Use: Never    Passive Exposure: Never  Financial Resource Strain: Low Risk (01/10/2024)   Overall Financial Resource Strain (CARDIA)    Difficulty of Paying Living Expenses: Not hard at all   Food Insecurity: No Food Insecurity (01/10/2024)   Epic    Worried About Programme Researcher, Broadcasting/film/video in the Last Year: Never true    Ran Out of Food in the Last Year: Never true  Transportation Needs: No Transportation Needs (01/10/2024)   Epic    Lack of Transportation (Medical): No    Lack of Transportation (Non-Medical): No  Physical Activity: Insufficiently Active (01/10/2024)   Exercise Vital Sign    Days of Exercise per Week: 1 day    Minutes of Exercise per Session: 30 min  Stress: Stress Concern Present (01/10/2024)   [employer] of Occupational Health - Occupational Stress Questionnaire    Feeling of Stress: Very much  Social Connections: Moderately Isolated (01/10/2024)   Social Connection and Isolation Panel    Frequency of Communication with Friends and Family: More than three times a week    Frequency of Social Gatherings with Friends and Family: Three times a week    Attends Religious Services: 1 to 4 times per year    Active Member of Clubs or Organizations: No  Attends Banker Meetings: Not on file    Marital Status: Divorced  Intimate Partner Violence: Not At Risk (01/05/2022)   Humiliation, Afraid, Rape, and Kick questionnaire    Fear of Current or Ex-Partner: No    Emotionally Abused: No    Physically Abused: No    Sexually Abused: No  Depression (PHQ2-9): Medium Risk (12/17/2023)   Depression (PHQ2-9)    PHQ-2 Score: 9  Alcohol Screen: Low Risk (01/10/2024)   Alcohol Screen    Last Alcohol Screening Score (AUDIT): 2  Housing: Low Risk (01/10/2024)   Epic    Unable to Pay for Housing in the Last Year: No    Number of Times Moved in the Last Year: 0    Homeless in the Last Year: No  Utilities: Not At Risk (01/05/2022)   [facility] Utilities    Threatened with loss of utilities: No  Health Literacy: Not on file    Review of Systems:  All other review of systems negative except as mentioned in the HPI.  Physical Exam: Vital signs in last 24  hours: BP (!) 147/85   Pulse 90   Temp 97.8 F (36.6 C) (Temporal)   Ht 5' 6 (1.676 m)   Wt 167 lb (75.8 kg)   LMP 01/31/2007   SpO2 99%   BMI 26.95 kg/m  General:   Alert, NAD Lungs:  Clear .   Heart:  Regular rate and rhythm Abdomen:  Soft, nontender and nondistended. Neuro/Psych:  Alert and cooperative. Normal mood and affect. A and O x 3  Reviewed labs, radiology imaging, old records and pertinent past GI work up  Patient is appropriate for planned procedure(s) and anesthesia in an ambulatory setting   K. Veena Maleyah Evans , MD [phone]

## 2024-02-11 NOTE — Progress Notes (Signed)
 Called to room to assist during endoscopic procedure.  Patient ID and intended procedure confirmed with present staff. Received instructions for my participation in the procedure from the performing physician.

## 2024-02-11 NOTE — Progress Notes (Signed)
 Pt's states no medical or surgical changes since previsit or office visit.

## 2024-02-11 NOTE — Op Note (Signed)
 [facility] Patient Name: Tiffany Horn Procedure Date: 02/11/2024 8:54 AM MRN: [id number] Endoscopist: Gustav ALONSO Mcgee , MD, [id number] Age: 64 Referring MD:  Date of Birth: [birth year] Gender: Female Account #: [id number] Procedure:                Colonoscopy Indications:              Clinically significant diarrhea of unexplained                            origin, Weight loss Medicines:                Monitored Anesthesia Care Procedure:                Pre-Anesthesia Assessment:                           - Prior to the procedure, a History and Physical                            was performed, and patient medications and                            allergies were reviewed. The patient's tolerance of                            previous anesthesia was also reviewed. The risks                            and benefits of the procedure and the sedation                            options and risks were discussed with the patient.                            All questions were answered, and informed consent                            was obtained. Prior Anticoagulants: The patient has                            taken no anticoagulant or antiplatelet agents. ASA                            Grade Assessment: III - A patient with severe                            systemic disease. After reviewing the risks and                            benefits, the patient was deemed in satisfactory                            condition to undergo the procedure.  After obtaining informed consent, the colonoscope                            was passed under direct vision. Throughout the                            procedure, the patient's blood pressure, pulse, and                            oxygen saturations were monitored continuously. The                            PCF-H190TL Slim SN [id number] was introduced through                            the anus and advanced to the  the terminal ileum,                            with identification of the appendiceal orifice and                            IC valve. The colonoscopy was performed without                            difficulty. The patient tolerated the procedure                            well. The quality of the bowel preparation was                            good. The ileocecal valve, appendiceal orifice, and                            rectum were photographed. Scope In: 9:11:56 AM Scope Out: 9:26:51 AM Scope Withdrawal Time: 0 hours 7 minutes 20 seconds  Total Procedure Duration: 0 hours 14 minutes 55 seconds  Findings:                 The perianal and digital rectal examinations were                            normal.                           Normal mucosa was found in the entire colon.                            Biopsies for histology were taken with a cold                            forceps from the right colon and left colon for                            evaluation of microscopic colitis.  Non-bleeding external and internal hemorrhoids were                            found during retroflexion. The hemorrhoids were                            medium-sized. Complications:            No immediate complications. Estimated Blood Loss:     Estimated blood loss was minimal. Impression:               - Normal mucosa in the entire examined colon.                            Biopsied.                           - Non-bleeding external and internal hemorrhoids. Recommendation:           - Resume previous diet.                           - Continue present medications.                           - Await pathology results.                           - Return to GI office at the next available                            appointment in 4-6 weeks. [name] V. Darran Gabay, MD 02/11/2024 9:43:32 AM This report has been signed electronically.

## 2024-02-12 ENCOUNTER — Telehealth: Payer: Self-pay

## 2024-02-12 NOTE — Telephone Encounter (Signed)
" °  Follow up Call-     02/11/2024    8:14 AM 01/02/2023   12:59 PM 07/12/2022    8:59 AM  Call back number  Post procedure Call Back phone  # [phone]  Permission to leave phone message Yes Yes Yes     Patient questions:  Do you have a fever, pain , or abdominal swelling? No. Pain Score  0 *  Have you tolerated food without any problems? Yes.    Have you been able to return to your normal activities? Yes.    Do you have any questions about your discharge instructions: Diet   No. Medications  No. Follow up visit  No.  Do you have questions or concerns about your Care? No.  Actions: * If pain score is 4 or above: No action needed, pain <4.   "

## 2024-02-14 LAB — SURGICAL PATHOLOGY

## 2024-02-14 NOTE — Telephone Encounter (Signed)
 Noted

## 2024-02-15 ENCOUNTER — Ambulatory Visit: Payer: Self-pay | Admitting: Gastroenterology

## 2024-02-20 ENCOUNTER — Encounter: Admitting: Gastroenterology

## 2024-02-21 ENCOUNTER — Telehealth: Payer: Self-pay

## 2024-02-21 NOTE — Telephone Encounter (Signed)
 Patient had sent a patient advise request with questions about her pathology results from the colonoscopy done on 02/11/24.  Hi, I got your letter about the pathology result being negative for microscopic colitis on your end, but it says positive in my chart??? I am confused. Can someone call me and explain please asap so I will know what dietary changes I need to make? Also, what scans are you ordering if that was all negative? I need afternoon appointments for anything please. Thank you so much!!   Called the patient. Reviewed the path]ology results with her. Patient expresses understanding that there is not evidence of microscopic colitis per pathology findings. Patient appointment scheduled for follow up 03/17/24.  She asks if there are any scans to be done or further labs before her appointment.

## 2024-02-21 NOTE — Telephone Encounter (Signed)
 Agree with follow up appt so we can discuss her symptoms further and order appropriate test

## 2024-02-22 ENCOUNTER — Ambulatory Visit: Admitting: Family Medicine

## 2024-02-25 ENCOUNTER — Encounter: Payer: Self-pay | Admitting: Gastroenterology

## 2024-02-26 NOTE — Telephone Encounter (Signed)
 Patient is scheduled for 03/17/24 w/Dr. Nandigam.  Called patient and left a detailed vmail regarding Dr. Trenna recommendations regarding further testing and if she had any other questions to call the office.

## 2024-03-17 ENCOUNTER — Ambulatory Visit: Admitting: Gastroenterology
